# Patient Record
Sex: Male | Born: 1950 | Race: White | Hispanic: No | Marital: Married
Health system: Midwestern US, Community
[De-identification: ages and names within clinical notes are randomized; demographics above are authoritative.]

## PROBLEM LIST (undated history)

## (undated) DIAGNOSIS — N200 Calculus of kidney: Secondary | ICD-10-CM

## (undated) DIAGNOSIS — E119 Type 2 diabetes mellitus without complications: Secondary | ICD-10-CM

## (undated) DIAGNOSIS — G3184 Mild cognitive impairment, so stated: Secondary | ICD-10-CM

## (undated) DIAGNOSIS — R413 Other amnesia: Secondary | ICD-10-CM

## (undated) DIAGNOSIS — I10 Essential (primary) hypertension: Secondary | ICD-10-CM

## (undated) DIAGNOSIS — G2581 Restless legs syndrome: Secondary | ICD-10-CM

## (undated) DIAGNOSIS — R0981 Nasal congestion: Secondary | ICD-10-CM

## (undated) DIAGNOSIS — J310 Chronic rhinitis: Secondary | ICD-10-CM

## (undated) DIAGNOSIS — F32A Depression, unspecified: Secondary | ICD-10-CM

## (undated) DIAGNOSIS — E782 Mixed hyperlipidemia: Secondary | ICD-10-CM

## (undated) DIAGNOSIS — Z8619 Personal history of other infectious and parasitic diseases: Secondary | ICD-10-CM

## (undated) DIAGNOSIS — M549 Dorsalgia, unspecified: Secondary | ICD-10-CM

## (undated) DIAGNOSIS — Z9989 Dependence on other enabling machines and devices: Secondary | ICD-10-CM

## (undated) DIAGNOSIS — F329 Major depressive disorder, single episode, unspecified: Secondary | ICD-10-CM

## (undated) DIAGNOSIS — N400 Enlarged prostate without lower urinary tract symptoms: Secondary | ICD-10-CM

## (undated) DIAGNOSIS — G4700 Insomnia, unspecified: Secondary | ICD-10-CM

## (undated) DIAGNOSIS — E291 Testicular hypofunction: Secondary | ICD-10-CM

## (undated) DIAGNOSIS — Z8701 Personal history of pneumonia (recurrent): Secondary | ICD-10-CM

## (undated) DIAGNOSIS — J309 Allergic rhinitis, unspecified: Secondary | ICD-10-CM

## (undated) DIAGNOSIS — Z86010 Personal history of colonic polyps: Secondary | ICD-10-CM

## (undated) DIAGNOSIS — M170 Bilateral primary osteoarthritis of knee: Secondary | ICD-10-CM

## (undated) DIAGNOSIS — M5136 Other intervertebral disc degeneration, lumbar region: Secondary | ICD-10-CM

## (undated) DIAGNOSIS — G4733 Obstructive sleep apnea (adult) (pediatric): Secondary | ICD-10-CM

## (undated) DIAGNOSIS — N529 Male erectile dysfunction, unspecified: Secondary | ICD-10-CM

## (undated) DIAGNOSIS — R197 Diarrhea, unspecified: Principal | ICD-10-CM

## (undated) DIAGNOSIS — N401 Enlarged prostate with lower urinary tract symptoms: Secondary | ICD-10-CM

## (undated) DIAGNOSIS — J324 Chronic pansinusitis: Principal | ICD-10-CM

## (undated) DIAGNOSIS — E1165 Type 2 diabetes mellitus with hyperglycemia: Secondary | ICD-10-CM

## (undated) DIAGNOSIS — F419 Anxiety disorder, unspecified: Secondary | ICD-10-CM

## (undated) DIAGNOSIS — E7800 Pure hypercholesterolemia, unspecified: Secondary | ICD-10-CM

## (undated) DIAGNOSIS — Z1211 Encounter for screening for malignant neoplasm of colon: Secondary | ICD-10-CM

## (undated) DIAGNOSIS — D649 Anemia, unspecified: Secondary | ICD-10-CM

## (undated) DIAGNOSIS — K219 Gastro-esophageal reflux disease without esophagitis: Principal | ICD-10-CM

## (undated) DIAGNOSIS — R3912 Poor urinary stream: Principal | ICD-10-CM

## (undated) HISTORY — DX: Personal history of pneumonia (recurrent): Z87.01

## (undated) HISTORY — DX: Chronic rhinitis: J31.0

## (undated) HISTORY — DX: Other intervertebral disc degeneration, lumbar region: M51.36

## (undated) HISTORY — DX: Depression, unspecified: F32.A

## (undated) HISTORY — DX: Nasal congestion: R09.81

## (undated) HISTORY — DX: Dorsalgia, unspecified: M54.9

## (undated) HISTORY — DX: Restless legs syndrome: G25.81

## (undated) HISTORY — DX: Bilateral primary osteoarthritis of knee: M17.0

## (undated) HISTORY — DX: Mild cognitive impairment of uncertain or unknown etiology: G31.84

## (undated) HISTORY — DX: Personal history of colonic polyps: Z86.010

## (undated) HISTORY — DX: Type 2 diabetes mellitus without complications: E11.9

## (undated) HISTORY — DX: Allergic rhinitis, unspecified: J30.9

## (undated) HISTORY — PX: VITRECTOMY: SHX106

## (undated) HISTORY — DX: Obstructive sleep apnea (adult) (pediatric): G47.33

## (undated) HISTORY — DX: Insomnia, unspecified: G47.00

## (undated) HISTORY — DX: Calculus of kidney: N20.0

## (undated) HISTORY — DX: Testicular hypofunction: E29.1

## (undated) HISTORY — DX: Male erectile dysfunction, unspecified: N52.9

## (undated) HISTORY — DX: Personal history of other infectious and parasitic diseases: Z86.19

## (undated) HISTORY — PX: COLONOSCOPY: SHX174

## (undated) HISTORY — DX: Dependence on other enabling machines and devices: Z99.89

## (undated) HISTORY — PX: URETHRAL DILATION: SUR417

## (undated) HISTORY — DX: Major depressive disorder, single episode, unspecified: F32.9

## (undated) HISTORY — DX: Mixed hyperlipidemia: E78.2

## (undated) HISTORY — DX: Other amnesia: R41.3

## (undated) HISTORY — DX: Essential (primary) hypertension: I10

## (undated) HISTORY — PX: TONSILLECTOMY AND ADENOIDECTOMY: SHX28

## (undated) HISTORY — DX: Benign prostatic hyperplasia without lower urinary tract symptoms: N40.0

---
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
## 1991-12-01 DIAGNOSIS — E782 Mixed hyperlipidemia: Secondary | ICD-10-CM

## 1991-12-01 HISTORY — DX: Mixed hyperlipidemia: E78.2

## 1992-11-30 HISTORY — PX: VASECTOMY: SHX75

## 1998-11-30 HISTORY — PX: CORNEAL TRANSPLANT: SHX108

## 2006-11-30 DIAGNOSIS — Z8701 Personal history of pneumonia (recurrent): Secondary | ICD-10-CM

## 2006-11-30 HISTORY — DX: Personal history of pneumonia (recurrent): Z87.01

## 2007-12-01 DIAGNOSIS — I10 Essential (primary) hypertension: Secondary | ICD-10-CM

## 2007-12-01 HISTORY — DX: Essential (primary) hypertension: I10

## 2008-11-30 DIAGNOSIS — M51369 Other intervertebral disc degeneration, lumbar region without mention of lumbar back pain or lower extremity pain: Secondary | ICD-10-CM

## 2008-11-30 DIAGNOSIS — M5136 Other intervertebral disc degeneration, lumbar region: Secondary | ICD-10-CM

## 2008-11-30 HISTORY — DX: Other intervertebral disc degeneration, lumbar region without mention of lumbar back pain or lower extremity pain: M51.369

## 2008-11-30 HISTORY — DX: Other intervertebral disc degeneration, lumbar region: M51.36

## 2009-01-28 HISTORY — PX: LUMBAR LAMINECTOMY: SHX95

## 2009-01-29 HISTORY — PX: ELECTROCARDIOGRAM: SHX264

## 2011-07-08 ENCOUNTER — Encounter: Payer: Self-pay | Admitting: Family Medicine

## 2011-07-08 ENCOUNTER — Ambulatory Visit (INDEPENDENT_AMBULATORY_CARE_PROVIDER_SITE_OTHER): Payer: BC Managed Care – PPO | Admitting: Family Medicine

## 2011-07-08 DIAGNOSIS — I10 Essential (primary) hypertension: Secondary | ICD-10-CM

## 2011-07-08 DIAGNOSIS — E785 Hyperlipidemia, unspecified: Secondary | ICD-10-CM

## 2011-07-08 DIAGNOSIS — Z86010 Personal history of colonic polyps: Secondary | ICD-10-CM

## 2011-07-08 DIAGNOSIS — E669 Obesity, unspecified: Secondary | ICD-10-CM

## 2011-07-08 DIAGNOSIS — N400 Enlarged prostate without lower urinary tract symptoms: Secondary | ICD-10-CM

## 2011-07-08 DIAGNOSIS — R351 Nocturia: Secondary | ICD-10-CM

## 2011-07-08 DIAGNOSIS — Z947 Corneal transplant status: Secondary | ICD-10-CM

## 2011-07-08 DIAGNOSIS — E119 Type 2 diabetes mellitus without complications: Secondary | ICD-10-CM

## 2011-07-08 MED ORDER — FINASTERIDE 5 MG PO TABS: 5.0000 mg | ORAL_TABLET | Freq: Every day | ORAL | Status: DC

## 2011-07-08 NOTE — Assessment & Plan Note (Signed)
Control unknown due to no old records and patient not self-monitoring. His nocturia is likely symptom of his BPH but could also reflect hyperglycemia/glucosuria. Check HbA1c, CMET, obtain old records.  No changes in meds for this today. I encouraged him to start checking glucoses at least when he feels bad, gave him a glucometer and samples of strips (Freestyle lyte).

## 2011-07-08 NOTE — Assessment & Plan Note (Signed)
Refer to ophthalmologist 

## 2011-07-08 NOTE — Assessment & Plan Note (Signed)
Obtain old records. Check fasting lipid panel and CMET ASAP (not fasting today).

## 2011-07-08 NOTE — Progress Notes (Signed)
Office Note 07/08/2011  CC:  Chief Complaint  Patient presents with  . Establish Care    HPI:  [name] [name] is a 60 y.o. White male who is here to establish care. Patient's most recent primary MD: Dr. Mickey Farber, [place], [place]. Old records were not reviewed prior to or during today's visit.  Recently relocated from [name] in April this year.   No acute complaints except he does say he continues to have nocturia---q2h--but minimal daytime BPH sx's. Improved since being put on flomax at 0.8mg  qhs dosing but is interested in seeing if additional med would help b/c sleep is so disrupted by this frequent night-time urinating.  Has hx of EDS/fatigue and was dx'd with OSA and he could not tolerate CPAP in 2004.  When asked about possibly retrying now to see if updated masks/devices are better tolerated he said he would want to see if getting better sleep/peeing less at night helped first, then he'd reconsider trial of CPAP. Has DM x few years but doesn't monitor glucoses at home, says he didn't want to become obsessed with it.  Diabetic diet--not strict at all. He is sedentary other than brief daily walks with his dogs. Says prostate exams have been done but doesn't recall specific PSAs.   Needs GI referral for ongoing surveillance for hx of adenomatous colon polyps. 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 History of scarlet fever     age 76  . 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 Keratoconus     dx'd 1973  . Depression     Hospitalized for suicidal ideation 61  . Hyperlipidemia, mixed 1993  . Hypertension 2009  . Diabetes mellitus, type II   . OSA (obstructive sleep apnea) 2004    intolerant of CPAP  . History of pneumonia 2009    Hospitalized  . DDD  (degenerative disc disease), lumbar 2010    laminectomy 01/2009  . BPH (benign prostatic hypertrophy)   . Insomnia   . Rhinitis, chronic   . Erectile dysfunction     Past Surgical History  Procedure Date  . Appendectomy 1972  . Vasectomy 1994  . 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 Tonsillectomy and adenoidectomy     age 22  . Urethral dilation     2 as a child, one as an adult  . Colonoscopy X 3    Most recent was 2005, with removal of polyps in the first two.    Family History  Problem Relation Age of Onset  . Heart disease Mother   . Hyperlipidemia Mother   . Stroke Mother   . Diabetes Mother   . Depression Mother   . Alzheimer's disease Mother   . Stroke Father   . Hyperlipidemia Father   . Heart disease Father   . Diabetes Father   . Alcohol abuse Sister   . Depression Sister     History   Social History  . Marital Status: Married    Spouse Name: N/A    Number of Children: N/A  . Years of Education: N/A   Occupational History  . Not on file.   Social History Main Topics  . Smoking status: Never Smoker   . Smokeless  tobacco: Never Used  . Alcohol Use: Not on file  . Drug Use: Not on file  . Sexually Active: Not on file   Other Topics Concern  . Not on file   Social History Narrative   Divorced, then remarried.  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  Rare ETOH.  No drug abuse.Enjoys reading and spending time with his 2 dogs.    Outpatient Encounter Prescriptions as of 07/08/2011  Medication Sig Dispense Refill  . aspirin 325 MG tablet Take 325 mg by mouth daily.        . Choline Fenofibrate (TRILIPIX) 135 MG capsule Take 135 mg by mouth daily.        [name] Dextromethorphan-Guaifenesin (MUCINEX DM MAXIMUM STRENGTH) 60-1200 MG per 12 hr tablet Take 2 tablets by mouth at bedtime as needed.        . finasteride (PROSCAR) 5 MG tablet Take 1 tablet  (5 mg total) by mouth daily.  30 tablet  3  . glimepiride (AMARYL) 1 MG tablet Take 1 mg by mouth daily before breakfast.        . lisinopril (PRINIVIL,ZESTRIL) 10 MG tablet Take 10 mg by mouth daily.        . metFORMIN (GLUCOPHAGE) 500 MG tablet Take 500 mg by mouth 2 (two) times daily with a meal.        . mometasone (NASONEX) 50 MCG/ACT nasal spray Place 2 sprays into the nose daily.        . Naproxen Sodium (ALEVE) 220 MG CAPS Take 2 capsules by mouth at bedtime as needed.        . pravastatin (PRAVACHOL) 80 MG tablet Take 80 mg by mouth at bedtime.        . pseudoephedrine (SUDAFED) 30 MG tablet Take 60 mg by mouth at bedtime as needed.        . sildenafil (VIAGRA) 50 MG tablet Take 50 mg by mouth daily as needed.        . Tamsulosin HCl (FLOMAX) 0.4 MG CAPS Take 0.8 mg by mouth at bedtime.        . vitamin B-12 (CYANOCOBALAMIN) 1000 MCG tablet Take 1,000 mcg by mouth daily.        . zaleplon (SONATA) 10 MG capsule Take 10 mg by mouth at bedtime as needed.          Allergies  Allergen Reactions  . Codeine Nausea Only  . Penicillins Hives  . Sulfa Antibiotics Hives    ROS Review of Systems  Constitutional: Negative for fever, chills, appetite change and fatigue.  HENT: Negative for ear pain, congestion, sore throat, neck stiffness and dental problem.   Eyes: Negative for discharge, redness and visual disturbance.  Respiratory: Negative for cough, chest tightness, shortness of breath and wheezing.   Cardiovascular: Negative for chest pain, palpitations and leg swelling.  Gastrointestinal: Negative for nausea, vomiting, abdominal pain, diarrhea and blood in stool.  Genitourinary: Negative for dysuria, urgency, frequency, hematuria, flank pain and difficulty urinating.  Musculoskeletal: Negative for myalgias, back pain, joint swelling and arthralgias.  Skin: Negative for pallor and rash.  Neurological: Negative for dizziness, speech difficulty, weakness and headaches.    Hematological: Negative for adenopathy. Does not bruise/bleed easily.  Psychiatric/Behavioral: Negative for confusion, sleep disturbance and dysphoric mood. The patient is not nervous/anxious.     PE; Blood pressure 130/82, pulse 92, height 5\' 10"  (1.778 m),  weight 273 lb (123.832 kg), SpO2 93.00%. Gen: Alert, well appearing, obese white male.  Patient is oriented to person, place, time, and situation.  HEENT:   Eyes: no injection, icteris, swelling, or exudate.  EOMI, PERRLA. Nose: no drainage or turbinate edema/swelling. Mouth: lips without lesion/swelling.  Oral mucosa pink and moist.  Dentition intact and without obvious caries or gingival swelling.  Oropharynx without erythema, exudate, or swelling.  Neck: supple, ROM full.  Carotids 2+ bilat, without bruit.  No lymphadenopathy, thyromegaly, or mass. Chest: symmetric expansion, nonlabored respirations.  Clear and equal breath sounds in all lung fields.   CV: RRR, no m/r/g.  Peripheral pulses 2+ and symmetric. ABD: soft, NT, ND, BS normal.  No hepatospenomegaly or mass.  No bruits. EXT: no clubbing, cyanosis, or edema.   Pertinent labs:  none  ASSESSMENT AND PLAN:  New patient:   Type II or unspecified type diabetes mellitus without mention of complication, not stated as uncontrolled Control unknown due to no old records and patient not self-monitoring. His nocturia is likely symptom of his BPH but could also reflect hyperglycemia/glucosuria. Check HbA1c, CMET, obtain old records.  No changes in meds for this today. I encouraged him to start checking glucoses at least when he feels bad, gave him a glucometer and samples of strips (Freestyle lyte).  HTN (hypertension), benign Problem stable.  Continue current medications and diet appropriate for this condition.  We have reviewed our general long term plan for this problem and also reviewed symptoms and signs that should prompt the patient to call or return to the office. Checking  CMET.  Hyperlipidemia Obtain old records. Check fasting lipid panel and CMET ASAP (not fasting today).  Hx of adenomatous colonic polyps Refer to GI.  Status post corneal transplant Refer to ophthalmologist.  BPH (benign prostatic hypertrophy) Add proscar 5mg  qd.  Continue flomax at 0.8mg  qhs dosing. Check PSA. Obtain old records.     Return for appt for lab visit ASAP for fasting lab work and make o/v at your convenience for CPE.

## 2011-07-08 NOTE — Assessment & Plan Note (Signed)
Problem stable.  Continue current medications and diet appropriate for this condition.  We have reviewed our general long term plan for this problem and also reviewed symptoms and signs that should prompt the patient to call or return to the office. Checking CMET.

## 2011-07-08 NOTE — Assessment & Plan Note (Signed)
Refer to GI 

## 2011-07-08 NOTE — Assessment & Plan Note (Signed)
Add proscar 5mg  qd.  Continue flomax at 0.8mg  qhs dosing. Check PSA. Obtain old records.

## 2011-07-09 ENCOUNTER — Encounter: Payer: Self-pay | Admitting: Gastroenterology

## 2011-07-09 ENCOUNTER — Other Ambulatory Visit (INDEPENDENT_AMBULATORY_CARE_PROVIDER_SITE_OTHER): Payer: BC Managed Care – PPO

## 2011-07-09 ENCOUNTER — Other Ambulatory Visit: Payer: Self-pay | Admitting: Family Medicine

## 2011-07-09 DIAGNOSIS — E785 Hyperlipidemia, unspecified: Secondary | ICD-10-CM

## 2011-07-09 DIAGNOSIS — E669 Obesity, unspecified: Secondary | ICD-10-CM

## 2011-07-09 DIAGNOSIS — R351 Nocturia: Secondary | ICD-10-CM

## 2011-07-09 DIAGNOSIS — E119 Type 2 diabetes mellitus without complications: Secondary | ICD-10-CM

## 2011-07-09 DIAGNOSIS — N400 Enlarged prostate without lower urinary tract symptoms: Secondary | ICD-10-CM

## 2011-07-09 DIAGNOSIS — I10 Essential (primary) hypertension: Secondary | ICD-10-CM

## 2011-07-09 LAB — LIPID PANEL
Cholesterol: 215 mg/dL — ABNORMAL HIGH (ref 0–200)
HDL: 43.5 mg/dL (ref >39.00)
Total CHOL/HDL Ratio: 5
Triglycerides: 245 mg/dL — ABNORMAL HIGH (ref 0.0–149.0)
VLDL: 49 mg/dL — ABNORMAL HIGH (ref 0.0–40.0)

## 2011-07-09 LAB — CBC WITH DIFFERENTIAL/PLATELET
Basophils Absolute: 0 10*3/uL (ref 0.0–0.1)
Basophils Relative: 0.4 % (ref 0.0–3.0)
Eosinophils Absolute: 0.1 10*3/uL (ref 0.0–0.7)
Eosinophils Relative: 1.7 % (ref 0.0–5.0)
HCT: 39.8 % (ref 39.0–52.0)
Hemoglobin: 13.4 g/dL (ref 13.0–17.0)
Lymphocytes Relative: 41.1 % (ref 12.0–46.0)
Lymphs Abs: 2.7 10*3/uL (ref 0.7–4.0)
MCHC: 33.7 g/dL (ref 30.0–36.0)
MCV: 89.6 fl (ref 78.0–100.0)
Monocytes Absolute: 0.5 10*3/uL (ref 0.1–1.0)
Monocytes Relative: 7.2 % (ref 3.0–12.0)
Neutro Abs: 3.2 10*3/uL (ref 1.4–7.7)
Neutrophils Relative %: 49.6 % (ref 43.0–77.0)
Platelets: 223 10*3/uL (ref 150.0–400.0)
RBC: 4.45 Mil/uL (ref 4.22–5.81)
RDW: 12.8 % (ref 11.5–14.6)
WBC: 6.5 10*3/uL (ref 4.5–10.5)

## 2011-07-09 LAB — COMPREHENSIVE METABOLIC PANEL
ALT: 22 U/L (ref 0–53)
AST: 16 U/L (ref 0–37)
Albumin: 4.1 g/dL (ref 3.5–5.2)
Alkaline Phosphatase: 42 U/L (ref 39–117)
BUN: 19 mg/dL (ref 6–23)
CO2: 26 mEq/L (ref 19–32)
Calcium: 9.2 mg/dL (ref 8.4–10.5)
Chloride: 99 mEq/L (ref 96–112)
Creatinine, Ser: 0.8 mg/dL (ref 0.4–1.5)
GFR: 103.82 mL/min (ref >60.00)
Glucose, Bld: 172 mg/dL — ABNORMAL HIGH (ref 70–99)
Potassium: 4.1 mEq/L (ref 3.5–5.1)
Sodium: 136 mEq/L (ref 135–145)
Total Bilirubin: 0.9 mg/dL (ref 0.3–1.2)
Total Protein: 6.7 g/dL (ref 6.0–8.3)

## 2011-07-09 LAB — PSA: PSA: 0.12 ng/mL (ref 0.10–4.00)

## 2011-07-09 LAB — TSH: TSH: 1.44 u[IU]/mL (ref 0.35–5.50)

## 2011-07-09 LAB — LDL CHOLESTEROL, DIRECT: Direct LDL: 131.4 mg/dL

## 2011-07-14 LAB — HEMOGLOBIN A1C: Hgb A1c MFr Bld: 7.5 % — ABNORMAL HIGH (ref 4.6–6.5)

## 2011-07-15 ENCOUNTER — Ambulatory Visit (INDEPENDENT_AMBULATORY_CARE_PROVIDER_SITE_OTHER): Payer: BC Managed Care – PPO | Admitting: Family Medicine

## 2011-07-15 ENCOUNTER — Encounter: Payer: Self-pay | Admitting: Family Medicine

## 2011-07-15 VITALS — BP 131/86 | HR 86 | Temp 98.1°F | Ht 70.0 in | Wt 273.0 lb

## 2011-07-15 DIAGNOSIS — Z0000 Encounter for general adult medical examination without abnormal findings: Secondary | ICD-10-CM

## 2011-07-15 DIAGNOSIS — E119 Type 2 diabetes mellitus without complications: Secondary | ICD-10-CM

## 2011-07-15 DIAGNOSIS — E785 Hyperlipidemia, unspecified: Secondary | ICD-10-CM

## 2011-07-15 MED ORDER — COLESEVELAM HCL 3.75 G PO PACK: 3.7500 g | PACK | Freq: Every day | ORAL | Status: DC

## 2011-07-15 NOTE — Patient Instructions (Signed)
Health Maintenance in Males MAINTAIN REGULAR HEALTH EXAMS  Maintain a healthy diet and normal weight. Increased weight leads to problems with blood pressure and diabetes. Decrease fat in the diet and increase exercise. Obtain a proper diet from your caregiver if necessary.   High blood pressure causes heart and blood vessel problems. Check blood pressures regularly and keep your blood pressure at normal limits. Aerobic exercise helps this. Persistent elevations of blood pressure should be treated with medications if weight loss and exercise are ineffective.   Avoid smoking, drinking in excess (more than 2 drinks per day), or use of street drugs. Do not share needles with anyone. Ask for help if you need assistance or instructions on stopping the use of alcohol, cigarettes, or drugs.   Maintain normal blood lipids and cholesterol. Your caregiver can give you information to lower your risk of heart disease or stroke.   Ask your caregiver if you are in need of early heart disease screening because of a strong family history of heart disease or signs of elevated testosterone (male sex hormone) levels. These can predispose you to early heart disease.   Practice safe sex. Practicing safe sex decreases your risk for a sexually transmitted infection (STI). Some of the STIs are gonorrhea, chlamydia, syphilis, trichimonas, herpes, human papillomavirus (HPV), and human immunodeficiency virus (HIV). Herpes, HIV, and HPV are viral illnesses that have no cure. These can result in disability, cancer, and death.   It is not safe for someone who has AIDS or is HIV positive to have unprotected sex with a partner who is HIV positive. The reason for this is the fact that there are many different strains of HIV. If you have a strain that is readily treated with medications and then suddenly introduce a strain from a partner that has no further treatment options, you may suddenly have a strain of HIV that is untreatable.  Even if you are both positive for HIV, it is still necessary to practice safe sex.   Use sunscreen with a SPF of 15 or greater. Being outside in the sun when your shadow caused by the sun is shorter than you are, means you are being exposed to sun at greater intensity. Lighter skinned people are at a greater risk of skin cancer.   Keep carbon monoxide and smoke detectors in your home and functioning at all times. Change the batteries every 6 months.   Do monthly examinations of your testicles. The best time to do this is after a hot shower or bath when the tissues are loose. Notify your caregivers of any lumps, tenderness, or changes in size or shape.   Notify your caregiver of new moles or changes in moles, especially if there is a change in shape or color. Also notify your caregiver if a mole is larger than the size of a pencil eraser.   Stay current with your tetanus shots and other required immunizations.  The Body Mass Index (BMI) is a way of measuring how much of your body is fat. Having a BMI above 27 increases the risk of heart disease, diabetes, hypertension, stroke, and other problems related to obesity. Document Released: 05/14/2008 Document Re-Released: 05/06/2010 [employer] Patient Information 2011 [employer]. 

## 2011-07-16 DIAGNOSIS — Z0000 Encounter for general adult medical examination without abnormal findings: Secondary | ICD-10-CM | POA: Insufficient documentation

## 2011-07-16 NOTE — Assessment & Plan Note (Signed)
Intolerant of any statin other than the one he is on now, so we'll leave him on this (he is on max dose) and will add welchol 3.75mg  pack in 4 oz juice once daily.  Low fat/low chol diet.

## 2011-07-16 NOTE — Progress Notes (Signed)
Office Note 07/16/2011  CC:  Chief Complaint  Patient presents with  . Annual Exam    HPI:  [name] [name] is a 60 y.o. White male who is here for annual CPE/health maintenance exam.  Feeling well, no acute complaints. Has started proscar x 5d or so but feels no difference in nocturia yet. No side effects noted. We reviewed his labs from last week in detail.  Of note, LDL and trigs not at goal, HbA1c 7.5%. All others, including PSA and TSH normal. He has had myalgias on multiple statins in the past (he cannot recall names), and wishes to avoid a switch to another statin.   Past Medical History  Diagnosis Date  . History of scarlet fever     age 70  . 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 Keratoconus     dx'd 1973  . Depression     Hospitalized for suicidal ideation 19  . Hyperlipidemia, mixed 1993  . Hypertension 2009  . Diabetes mellitus, type II   . OSA (obstructive sleep apnea) 2004    intolerant of CPAP  . History of pneumonia 2009    Hospitalized  . DDD (degenerative disc disease), lumbar 2010    laminectomy 01/2009  . BPH (benign prostatic hypertrophy)   . Insomnia   . Rhinitis, chronic   . Erectile dysfunction     Past Surgical History  Procedure Date  . Appendectomy 1972  . Vasectomy 1994  . 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 Tonsillectomy and adenoidectomy     age 66  . Urethral dilation     2 as a child, one as an adult  . Colonoscopy X 3    Most recent was 2005, with removal of polyps in the first two.    Family History  Problem Relation Age of Onset  . Heart disease Mother   . Hyperlipidemia Mother   . Stroke Mother   . Diabetes Mother   . Depression Mother   . Alzheimer's disease Mother   . Stroke Father   . Hyperlipidemia Father   . Heart disease Father   . Diabetes Father   . Alcohol abuse Sister   . Depression Sister     History   Social History  . Marital Status:  Married    Spouse Name: N/A    Number of Children: N/A  . Years of Education: N/A   Occupational History  . Not on file.   Social History Main Topics  . Smoking status: Never Smoker   . Smokeless tobacco: Never Used  . Alcohol Use: Not on file  . Drug Use: Not on file  . Sexually Active: Not on file   Other Topics Concern  . Not on file   Social History Narrative   Divorced, then remarried.  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  Rare ETOH.  No drug abuse.Enjoys reading and spending time with his 2 dogs.    Outpatient Prescriptions Prior to Visit  Medication Sig Dispense Refill  . aspirin 325 MG tablet Take 325 mg by mouth daily.        . Choline Fenofibrate (TRILIPIX) 135 MG capsule Take 135 mg by mouth daily.        . finasteride (PROSCAR) 5 MG tablet Take 1 tablet (5 mg total)  by mouth daily.  30 tablet  3  . lisinopril (PRINIVIL,ZESTRIL) 10 MG tablet Take 10 mg by mouth daily.        . metFORMIN (GLUCOPHAGE) 500 MG tablet Take 500 mg by mouth 2 (two) times daily with a meal.        . mometasone (NASONEX) 50 MCG/ACT nasal spray Place 2 sprays into the nose daily.        . Naproxen Sodium (ALEVE) 220 MG CAPS Take 2 capsules by mouth at bedtime as needed.        . pravastatin (PRAVACHOL) 80 MG tablet Take 80 mg by mouth at bedtime.        . pseudoephedrine (SUDAFED) 30 MG tablet Take 60 mg by mouth at bedtime as needed.        . sildenafil (VIAGRA) 50 MG tablet Take 50 mg by mouth daily as needed.        . Tamsulosin HCl (FLOMAX) 0.4 MG CAPS Take 0.8 mg by mouth at bedtime.        . vitamin B-12 (CYANOCOBALAMIN) 1000 MCG tablet Take 1,000 mcg by mouth daily.        . zaleplon (SONATA) 10 MG capsule Take 10 mg by mouth at bedtime as needed.        [name] Dextromethorphan-Guaifenesin (MUCINEX DM MAXIMUM STRENGTH) 60-1200 MG per 12 hr tablet Take 2 tablets by mouth at bedtime as  needed.        [name] glimepiride (AMARYL) 1 MG tablet Take 1 mg by mouth daily before breakfast.          Allergies  Allergen Reactions  . Antihistamines, Diphenhydramine-Type Other (See Comments)    depression  . Codeine Nausea Only  . Penicillins Hives  . Sulfa Antibiotics Hives    ROS Review of Systems  Constitutional: Negative for fever, chills, appetite change and fatigue.  HENT: Negative for ear pain, congestion, sore throat, neck stiffness and dental problem.   Eyes: Negative for discharge, redness and visual disturbance.  Respiratory: Negative for cough, chest tightness, shortness of breath and wheezing.   Cardiovascular: Negative for chest pain, palpitations and leg swelling.  Gastrointestinal: Negative for nausea, vomiting, abdominal pain, diarrhea and blood in stool.  Genitourinary: Negative for dysuria, urgency, frequency, hematuria, flank pain and difficulty urinating.  Musculoskeletal: Negative for myalgias, back pain, joint swelling and arthralgias.  Skin: Negative for pallor and rash.  Neurological: Negative for dizziness, speech difficulty, weakness and headaches.  Hematological: Negative for adenopathy. Does not bruise/bleed easily.  Psychiatric/Behavioral: Negative for confusion and sleep disturbance. The patient is not nervous/anxious.     PE; Blood pressure 131/86, pulse 86, temperature 98.1 F (36.7 C), temperature source Oral, height 5\' 10"  (1.778 m), weight 273 lb (123.832 kg), SpO2 92.00%. Gen: Alert, well appearing, obese-appearing, white male.  Patient is oriented to person, place, time, and situation. HEENT: Scalp without lesions or hair loss.  Ears: EACs clear, normal epithelium.  TMs with good light reflex and landmarks bilaterally.  Eyes: no injection, icteris, swelling, or exudate.  EOMI, PERRLA. Nose: no drainage or turbinate edema/swelling.  No injection or focal lesion.  Mouth: lips without lesion/swelling.  Oral mucosa pink and moist.  Dentition  intact and without obvious caries or gingival swelling.  Oropharynx without erythema, exudate, or swelling.  Neck: supple, ROM full.  Carotids 2+ bilat, without bruit.  No lymphadenopathy, thyromegaly, or mass. Chest: symmetric expansion, nonlabored respirations.  Clear and equal breath sounds in all lung fields.   CV:  RRR, no m/r/g.  Peripheral pulses 2+ and symmetric. ABD: soft, NT, ND, BS normal.  No hepatospenomegaly or mass.  No bruits. EXT: no clubbing, cyanosis, or edema.  Skin - no sores or suspicious lesions or rashes or color changes Neuro: CN 2-12 intact bilaterally, strength 5/5 in proximal and distal upper extremities and lower extremities bilaterally.  No sensory deficits.  No tremor.  No disdiadochokinesis.  No ataxia.  Upper extremity and lower extremity DTRs symmetric.  No pronator drift. Genitals normal; both testes normal without tenderness, masses, hydroceles, varicoceles, erythema or swelling. Shaft normal, circumcised, meatus normal without discharge. No inguinal hernia noted. No inguinal lymphadenopathy.   Pertinent labs:  none  ASSESSMENT AND PLAN:   Health maintenance examination General exercise recommendations, health screening approp for age, and vaccinations approp for age discussed today. Encouraged more stringent diabetic diet, also low sodium/heart healthy. Appt for GI to f/u hx of colon polyps is in process. Prostate cancer screening is neg (PSA + DRE normal w/in the last week)--repeat in 1 yr.  Type II or unspecified type diabetes mellitus without mention of complication, not stated as uncontrolled Lab Results  Component Value Date   HGBA1C 7.5* 07/09/2011   Continue current meds (he insists he is on the max dose of metformin--two of the 500mg  tabs twice daily) and get more aggressive with diabetic diet and exercise.  Also, he has yet to add the amaryl 1mg  qd that he has been rx.  Discussed starting this and monitoring glucoses qd (alt fasting with a 2H  pp), titrate amaryl to 2mg  or bid dosing as necessary.    Hyperlipidemia Intolerant of any statin other than the one he is on now, so we'll leave him on this (he is on max dose) and will add welchol 3.75mg  pack in 4 oz juice once daily.  Low fat/low chol diet.     FOLLOW UP:  Return in about 3 months (around 10/15/2011) for f/u DM 2, hyperchol, and BPH.

## 2011-07-16 NOTE — Assessment & Plan Note (Addendum)
General exercise recommendations, health screening approp for age, and vaccinations approp for age discussed today. Encouraged more stringent diabetic diet, also low sodium/heart healthy. Appt for GI to f/u hx of colon polyps is in process. Prostate cancer screening is neg (PSA + DRE normal w/in the last week)--repeat in 1 yr.

## 2011-07-16 NOTE — Assessment & Plan Note (Signed)
Lab Results  Component Value Date   HGBA1C 7.5* 07/09/2011   Continue current meds (he insists he is on the max dose of metformin--two of the 500mg  tabs twice daily) and get more aggressive with diabetic diet and exercise.  Also, he has yet to add the amaryl 1mg  qd that he has been rx.  Discussed starting this and monitoring glucoses qd (alt fasting with a 2H pp), titrate amaryl to 2mg  or bid dosing as necessary.

## 2011-07-24 ENCOUNTER — Encounter: Payer: Self-pay | Admitting: Family Medicine

## 2011-07-27 ENCOUNTER — Encounter: Payer: Self-pay | Admitting: Family Medicine

## 2011-08-10 ENCOUNTER — Other Ambulatory Visit: Payer: BC Managed Care – PPO | Admitting: Gastroenterology

## 2011-08-24 ENCOUNTER — Telehealth: Payer: Self-pay | Admitting: Family Medicine

## 2011-08-24 MED ORDER — CHOLINE FENOFIBRATE 135 MG PO CPDR: 135.0000 mg | DELAYED_RELEASE_CAPSULE | Freq: Every day | ORAL | Status: DC

## 2011-08-24 MED ORDER — PRAVASTATIN SODIUM 80 MG PO TABS: 80.0000 mg | ORAL_TABLET | Freq: Every day | ORAL | Status: DC

## 2011-08-24 MED ORDER — ESCITALOPRAM OXALATE 10 MG PO TABS: 10.0000 mg | ORAL_TABLET | Freq: Every day | ORAL | Status: DC

## 2011-08-24 NOTE — Telephone Encounter (Signed)
Patient is requesting refills for Pravastatin, trilipix, and escitalopram to be sent to [facility], [address] [place]

## 2011-08-24 NOTE — Telephone Encounter (Signed)
Last seen 07/15/11, follow up on 10/15/11.  RX sent until that time.

## 2011-09-16 ENCOUNTER — Other Ambulatory Visit: Payer: BC Managed Care – PPO | Admitting: Gastroenterology

## 2011-10-08 ENCOUNTER — Other Ambulatory Visit (INDEPENDENT_AMBULATORY_CARE_PROVIDER_SITE_OTHER): Payer: BC Managed Care – PPO

## 2011-10-08 DIAGNOSIS — E785 Hyperlipidemia, unspecified: Secondary | ICD-10-CM

## 2011-10-08 DIAGNOSIS — E119 Type 2 diabetes mellitus without complications: Secondary | ICD-10-CM

## 2011-10-08 LAB — HEMOGLOBIN A1C: Hgb A1c MFr Bld: 6.8 % — ABNORMAL HIGH (ref 4.6–6.5)

## 2011-10-08 LAB — LIPID PANEL
Cholesterol: 210 mg/dL — ABNORMAL HIGH (ref 0–200)
HDL: 43 mg/dL (ref >39.00)
Total CHOL/HDL Ratio: 5
Triglycerides: 255 mg/dL — ABNORMAL HIGH (ref 0.0–149.0)
VLDL: 51 mg/dL — ABNORMAL HIGH (ref 0.0–40.0)

## 2011-10-08 LAB — LDL CHOLESTEROL, DIRECT: Direct LDL: 141.4 mg/dL

## 2011-10-15 ENCOUNTER — Telehealth: Payer: Self-pay | Admitting: Family Medicine

## 2011-10-15 ENCOUNTER — Encounter: Payer: Self-pay | Admitting: Family Medicine

## 2011-10-15 ENCOUNTER — Ambulatory Visit (INDEPENDENT_AMBULATORY_CARE_PROVIDER_SITE_OTHER): Payer: BC Managed Care – PPO | Admitting: Family Medicine

## 2011-10-15 DIAGNOSIS — I10 Essential (primary) hypertension: Secondary | ICD-10-CM

## 2011-10-15 DIAGNOSIS — E119 Type 2 diabetes mellitus without complications: Secondary | ICD-10-CM

## 2011-10-15 DIAGNOSIS — N529 Male erectile dysfunction, unspecified: Secondary | ICD-10-CM

## 2011-10-15 DIAGNOSIS — Z86010 Personal history of colonic polyps: Secondary | ICD-10-CM

## 2011-10-15 DIAGNOSIS — Z947 Corneal transplant status: Secondary | ICD-10-CM

## 2011-10-15 DIAGNOSIS — Z23 Encounter for immunization: Secondary | ICD-10-CM

## 2011-10-15 DIAGNOSIS — N400 Enlarged prostate without lower urinary tract symptoms: Secondary | ICD-10-CM

## 2011-10-15 DIAGNOSIS — E782 Mixed hyperlipidemia: Secondary | ICD-10-CM

## 2011-10-15 DIAGNOSIS — R5381 Other malaise: Secondary | ICD-10-CM

## 2011-10-15 DIAGNOSIS — R5383 Other fatigue: Secondary | ICD-10-CM

## 2011-10-15 NOTE — Assessment & Plan Note (Signed)
No big changes since adding proscar about 48mo ago.  He'll give it a bit longer and may just self d/c if he doesn't see much change when it comes time for RF authorization.

## 2011-10-15 NOTE — Progress Notes (Signed)
OFFICE NOTE  10/15/2011  CC:  Chief Complaint  Patient presents with  . Hyperlipidemia  . Diabetes  . Benign Prostatic Hypertrophy     HPI:   Patient is a 60 y.o. Caucasian male who is here for DM 2, hyperlipidemia, and BPH. Added welchol a few months ago and most lipid numbers worsened just a tiny bit, HDL stayed the same. He admits to noncompliance with welchol for the last month or so. His A1c was improved from 7.5% to 6.8% on labs last week.  He does not check his glucoses any. Denies feet sx's. Says addition of proscar a few months ago has not changed his urinary frequency/nocturia any significant amount (goes max of 2.5-3 hrs between urinating).  Pertinent PMH:  Past Medical History  Diagnosis Date  . History of scarlet fever     age 18  . 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 Keratoconus     dx'd 1973  . Depression     Hospitalized for suicidal ideation 37.  Has been on lexapro since 10/2008.  [name] Hyperlipidemia, mixed 1993  . Hypertension 2009    "marked chronic cardiomegaly" on CXR 01/2009 per old records.  . Diabetes mellitus type II     Dx'd approximately 06/2008  . OSA (obstructive sleep apnea) 2004    intolerant of CPAP  . History of pneumonia 2009    Hospitalized  . DDD (degenerative disc disease), lumbar 2010    laminectomy 01/2009  . BPH (benign prostatic hypertrophy)   . Insomnia   . Rhinitis, chronic   . Erectile dysfunction    PSH, FH, Soc Hx reviewed and no changes since last f/u visit.  MEDS;   Outpatient Prescriptions Prior to Visit  Medication Sig Dispense Refill  . aspirin 325 MG tablet Take 325 mg by mouth daily.        . Choline Fenofibrate (TRILIPIX) 135 MG capsule Take 1 capsule (135 mg total) by mouth daily.  30 capsule  1  . Colesevelam HCl (WELCHOL) 3.75 G PACK Take 3.75 g by mouth daily.  90 each  6  . Dextromethorphan-Guaifenesin (MUCINEX DM MAXIMUM STRENGTH) 60-1200 MG per 12 hr  tablet Take 2 tablets by mouth at bedtime as needed.        [name] escitalopram (LEXAPRO) 10 MG tablet Take 1 tablet (10 mg total) by mouth daily.  30 tablet  1  . finasteride (PROSCAR) 5 MG tablet Take 1 tablet (5 mg total) by mouth daily.  30 tablet  3  . glimepiride (AMARYL) 1 MG tablet Take 1 mg by mouth daily before breakfast.        . lisinopril (PRINIVIL,ZESTRIL) 10 MG tablet Take 10 mg by mouth daily.        . metFORMIN (GLUCOPHAGE) 500 MG tablet Take 500 mg by mouth 2 (two) times daily with a meal.        . mometasone (NASONEX) 50 MCG/ACT nasal spray Place 2 sprays into the nose daily.        . Naproxen Sodium (ALEVE) 220 MG CAPS Take 2 capsules by mouth at bedtime as needed.        . pravastatin (PRAVACHOL) 80 MG tablet Take 1 tablet (80 mg total) by mouth at bedtime.  30 tablet  1  . pseudoephedrine (SUDAFED) 30 MG tablet Take 60 mg by mouth at bedtime as needed.        [name]  sildenafil (VIAGRA) 50 MG tablet Take 50 mg by mouth daily as needed.        . Tamsulosin HCl (FLOMAX) 0.4 MG CAPS Take 0.8 mg by mouth at bedtime.        . vitamin B-12 (CYANOCOBALAMIN) 1000 MCG tablet Take 1,000 mcg by mouth daily.        . zaleplon (SONATA) 10 MG capsule Take 10 mg by mouth at bedtime as needed.          PE: Blood pressure 134/84, pulse 99, height 5\' 10"  (1.778 m), weight 273 lb (123.832 kg). His wt is stable. Gen: Alert, well appearing.  Patient is oriented to person, place, time, and situation. No further exam today.  IMPRESSION AND PLAN:  Type II or unspecified type diabetes mellitus without mention of complication, not stated as uncontrolled A1c improved, at goal.  He's noncompliant with monitoring but does well with diet and med compliance. Eye exam UTD now: no diab retin. Urine microalb/cr ratio UTD (due again after 12/2011). Foot exam to be done at next f/u in 3 mo.   HTN (hypertension), benign Problem stable.  Continue current medications and diet appropriate for this condition.  We  have reviewed our general long term plan for this problem and also reviewed symptoms and signs that should prompt the patient to call or return to the office.   BPH (benign prostatic hypertrophy) No big changes since adding proscar about 7mo ago.  He'll give it a bit longer and may just self d/c if he doesn't see much change when it comes time for RF authorization.  Hx of adenomatous colonic polyps He's going to try to schedule this for early 2013 since he's had to put this off a few times for scheduling and financial reasons.  Status post corneal transplant His local eye MD referred him on to a different eye specialist to follow this.  Hyperlipidemia Due to relatively poor compliance with welchol we'll make no changes at this time.  He'll get more consistent with taking the welchol and he'll continue his trilipix and pravastatin.  We brought up the possibility of retrying niaspan in the near future.  He is doing well with low chol/low fat diet.   Fatigue, ED, wt issues, sleep issues: pt wonders about checking for low testosterone.  Will add this test to his labs at next f/u in 7mo.  Tdap and flu vaccine IM today.  FOLLOW UP:  Return in about 3 months (around 01/15/2012) for f/u DM 2, hyperlipidemia.

## 2011-10-15 NOTE — Assessment & Plan Note (Signed)
He's going to try to schedule this for early 2013 since he's had to put this off a few times for scheduling and financial reasons.

## 2011-10-15 NOTE — Assessment & Plan Note (Signed)
Due to relatively poor compliance with welchol we'll make no changes at this time.  He'll get more consistent with taking the welchol and he'll continue his trilipix and pravastatin.  We brought up the possibility of retrying niaspan in the near future.  He is doing well with low chol/low fat diet.

## 2011-10-15 NOTE — Assessment & Plan Note (Addendum)
A1c improved, at goal.  He's noncompliant with monitoring but does well with diet and med compliance. Eye exam UTD now: no diab retin. Urine microalb/cr ratio UTD (due again after 12/2011). Foot exam to be done at next f/u in 3 mo.

## 2011-10-15 NOTE — Assessment & Plan Note (Signed)
His local eye MD referred him on to a different eye specialist to follow this.

## 2011-10-15 NOTE — Progress Notes (Signed)
Addended by: Andrew Au on: 10/15/2011 11:22 AM   Modules accepted: Orders

## 2011-10-15 NOTE — Telephone Encounter (Signed)
Sorry, but I was supposed to put "lab visit for fasting labs the week prior to next f/u in 78mo" on his AVS. Could you call him at some point when you are not busy and schedule this lab visit?  Thx.

## 2011-10-15 NOTE — Assessment & Plan Note (Signed)
Problem stable.  Continue current medications and diet appropriate for this condition.  We have reviewed our general long term plan for this problem and also reviewed symptoms and signs that should prompt the patient to call or return to the office.  

## 2011-10-16 NOTE — Telephone Encounter (Signed)
Looks like Lowella Bandy already did it, she scheduled a lab visit for 01/08/12

## 2011-10-16 NOTE — Telephone Encounter (Signed)
Great.  Thx for checking!

## 2011-11-02 ENCOUNTER — Other Ambulatory Visit: Payer: Self-pay | Admitting: Family Medicine

## 2011-11-02 NOTE — Telephone Encounter (Signed)
Last seen 10/15/11, follow up on 01/15/12.  RX sent until that time.

## 2011-12-24 ENCOUNTER — Ambulatory Visit (INDEPENDENT_AMBULATORY_CARE_PROVIDER_SITE_OTHER): Payer: BC Managed Care – PPO | Admitting: Family Medicine

## 2011-12-24 ENCOUNTER — Encounter: Payer: Self-pay | Admitting: Family Medicine

## 2011-12-24 VITALS — BP 143/90 | HR 106 | Temp 98.0°F | Ht 70.0 in | Wt 274.0 lb

## 2011-12-24 DIAGNOSIS — J019 Acute sinusitis, unspecified: Secondary | ICD-10-CM

## 2011-12-24 MED ORDER — ESCITALOPRAM OXALATE 10 MG PO TABS: 10.0000 mg | ORAL_TABLET | Freq: Every day | ORAL | Status: DC

## 2011-12-24 MED ORDER — CHOLINE FENOFIBRATE 135 MG PO CPDR: 135.0000 mg | DELAYED_RELEASE_CAPSULE | Freq: Every day | ORAL | Status: DC

## 2011-12-24 MED ORDER — LISINOPRIL 10 MG PO TABS: 10.0000 mg | ORAL_TABLET | Freq: Every day | ORAL | Status: DC

## 2011-12-24 MED ORDER — HYDROCODONE-ACETAMINOPHEN 5-500 MG PO TABS: ORAL_TABLET | ORAL | Status: DC

## 2011-12-24 MED ORDER — ZALEPLON 10 MG PO CAPS: 10.0000 mg | ORAL_CAPSULE | Freq: Every evening | ORAL | Status: DC | PRN

## 2011-12-24 MED ORDER — METFORMIN HCL 500 MG PO TABS: 500.0000 mg | ORAL_TABLET | Freq: Two times a day (BID) | ORAL | Status: DC

## 2011-12-24 MED ORDER — AZITHROMYCIN 250 MG PO TABS: ORAL_TABLET | ORAL | Status: DC

## 2011-12-24 MED ORDER — FINASTERIDE 5 MG PO TABS: 5.0000 mg | ORAL_TABLET | Freq: Every day | ORAL | Status: DC

## 2011-12-24 MED ORDER — MOMETASONE FUROATE 50 MCG/ACT NA SUSP: NASAL | Status: DC

## 2011-12-24 MED ORDER — TAMSULOSIN HCL 0.4 MG PO CAPS: 0.8000 mg | ORAL_CAPSULE | Freq: Every day | ORAL | Status: DC

## 2011-12-24 MED ORDER — PRAVASTATIN SODIUM 80 MG PO TABS: 80.0000 mg | ORAL_TABLET | Freq: Every day | ORAL | Status: DC

## 2011-12-24 NOTE — Progress Notes (Signed)
OFFICE NOTE  12/24/2011  CC:  Chief Complaint  Patient presents with  . Cough    x 1 week     HPI: Patient is a 61 y.o. Caucasian male who is here for congestion/cough. Pt presents complaining of respiratory symptoms for 7 days.  Mostly nasal congestion/runny nose, sneezing, and PND cough.  Worst symptoms seems to be the head and face congestion.  Lately the symptoms seem to be worsening. No fevers, no wheezing, and no SOB.  No pain in face or teeth.  No significant HA.  ST mild at most.  Symptoms made worse by night time and cold air.  Symptoms improved a bit during daytime. Smoker? no Recent sick contact? unknown Muscle or joint aches? yes Flu shot this season at least 2 wks ago? yes  ROS: no n/v/d or abdominal pain.  No rash.  No neck stiffness.   +Mild fatigue.  +Mild appetite loss.   Pertinent PMH:  Past Medical History  Diagnosis Date  . History of scarlet fever     age 13  . 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 Keratoconus     dx'd 1973  . Depression     Hospitalized for suicidal ideation 55.  Has been on lexapro since 10/2008.  [name] Hyperlipidemia, mixed 1993  . Hypertension 2009    "marked chronic cardiomegaly" on CXR 01/2009 per old records.  . Diabetes mellitus type II     Dx'd approximately 06/2008  . OSA (obstructive sleep apnea) 2004    intolerant of CPAP  . History of pneumonia 2009    Hospitalized  . DDD (degenerative disc disease), lumbar 2010    laminectomy 01/2009  . BPH (benign prostatic hypertrophy)   . Insomnia   . Rhinitis, chronic   . Erectile dysfunction    Past surgical, social, and family history reviewed and no changes noted since last office visit.  MEDS:  Outpatient Prescriptions Prior to Visit  Medication Sig Dispense Refill  . aspirin 325 MG tablet Take 325 mg by mouth daily.        . Colesevelam HCl (WELCHOL) 3.75 G PACK Take 3.75 g by mouth daily.  90 each  6  .  Dextromethorphan-Guaifenesin (MUCINEX DM MAXIMUM STRENGTH) 60-1200 MG per 12 hr tablet Take 2 tablets by mouth at bedtime as needed.        [name] escitalopram (LEXAPRO) 10 MG tablet take 1 tablet by mouth once daily  30 tablet  2  . finasteride (PROSCAR) 5 MG tablet Take 1 tablet (5 mg total) by mouth daily.  30 tablet  3  . glimepiride (AMARYL) 1 MG tablet Take 1 mg by mouth daily before breakfast.        . lisinopril (PRINIVIL,ZESTRIL) 10 MG tablet Take 10 mg by mouth daily.        . metFORMIN (GLUCOPHAGE) 500 MG tablet Take 500 mg by mouth 2 (two) times daily with a meal.        . pravastatin (PRAVACHOL) 80 MG tablet take 1 tablet by mouth at bedtime if needed  30 tablet  2  . pseudoephedrine (SUDAFED) 30 MG tablet Take 60 mg by mouth at bedtime as needed.        . sildenafil (VIAGRA) 50 MG tablet Take 50 mg by mouth daily as needed.        . Tamsulosin HCl (FLOMAX) 0.4 MG CAPS Take 0.8 mg by mouth  at bedtime.        . TRILIPIX 135 MG capsule take 1 capsule by mouth once daily  30 capsule  2  . vitamin B-12 (CYANOCOBALAMIN) 1000 MCG tablet Take 1,000 mcg by mouth daily.        . zaleplon (SONATA) 10 MG capsule Take 10 mg by mouth at bedtime as needed.        . mometasone (NASONEX) 50 MCG/ACT nasal spray Place 2 sprays into the nose daily.        . Naproxen Sodium (ALEVE) 220 MG CAPS Take 2 capsules by mouth at bedtime as needed.          PE: Blood pressure 143/90, pulse 106, temperature 98 F (36.7 C), temperature source Temporal, height 5\' 10"  (1.778 m), weight 274 lb (124.286 kg), SpO2 95.00%. VS: noted--normal. Gen: alert, NAD, NONTOXIC APPEARING. HEENT: eyes without injection, drainage, or swelling.  Ears: EACs clear, TMs with normal light reflex and landmarks.  Nose: mucosa injected, dried/yellowish crusty mucous adherent to mucosa, turbinates edematous.  No purulent d/c.  No paranasal sinus TTP.  No facial swelling.  Throat and mouth without focal lesion.  No pharyngial swelling, erythema,  or exudate.   Neck: supple, no LAD.   LUNGS: CTA bilat, nonlabored resps.  Aeration is great.  NO post-exhalational coughing. CV: RRR, soft systolic murmur at upper sternal area, S1 and S2 normal EXT: no c/c/e SKIN: no rash  IMPRESSION AND PLAN: Acute sinusitis, with PND cough.  No sign of RAD or pneumonia. Plan: azithromycin x 5d, saline nasal spray disussed, avoid OTC decongestants. He has to avoid antihistamines b/c they have a profound effect on his mood (depression), so I cannot rx hydrocodone+ homotropine for hs use.  In it's place, I rx'd vicodin 5/500, 1-2 tabs po qhs. RF'd many of his chronic meds today.  FOLLOW UP: he has f/u scheduled in February but says he will have to reschedule this b/c he's going on vacation at that time.

## 2012-01-06 ENCOUNTER — Telehealth: Payer: Self-pay | Admitting: *Deleted

## 2012-01-06 NOTE — Telephone Encounter (Signed)
Pt was seen on 12/24/11.  He is doing better and has completed antibiotics.  His cough lingers, but is improving.  Cough is occasionally productive.  Pt continues with hydrocodone at night and Mucinex DM as directed.  Pt had bout of pneumonia a few years ago and is being cautious.  Pt is not requesting meds, but would like to know if he needs to be seen again.  Pt does not sound congested or short of breath while talking.  Please advise.

## 2012-01-06 NOTE — Telephone Encounter (Signed)
Reassure patient that his recovery sounds appropriate and I would expect things to gradually resolve over the next couple of weeks.  thx

## 2012-01-07 NOTE — Telephone Encounter (Signed)
Pt notified and is agreeable. 

## 2012-01-08 ENCOUNTER — Other Ambulatory Visit (INDEPENDENT_AMBULATORY_CARE_PROVIDER_SITE_OTHER): Payer: BC Managed Care – PPO

## 2012-01-08 DIAGNOSIS — E782 Mixed hyperlipidemia: Secondary | ICD-10-CM

## 2012-01-08 DIAGNOSIS — E291 Testicular hypofunction: Secondary | ICD-10-CM

## 2012-01-08 DIAGNOSIS — I10 Essential (primary) hypertension: Secondary | ICD-10-CM

## 2012-01-08 DIAGNOSIS — R5383 Other fatigue: Secondary | ICD-10-CM

## 2012-01-08 DIAGNOSIS — R5381 Other malaise: Secondary | ICD-10-CM

## 2012-01-08 DIAGNOSIS — E119 Type 2 diabetes mellitus without complications: Secondary | ICD-10-CM

## 2012-01-08 DIAGNOSIS — N529 Male erectile dysfunction, unspecified: Secondary | ICD-10-CM

## 2012-01-08 LAB — BASIC METABOLIC PANEL
BUN: 19 mg/dL (ref 6–23)
CO2: 26 mEq/L (ref 19–32)
Calcium: 9.3 mg/dL (ref 8.4–10.5)
Chloride: 101 mEq/L (ref 96–112)
Creatinine, Ser: 0.9 mg/dL (ref 0.4–1.5)
GFR: 89.48 mL/min (ref >60.00)
Glucose, Bld: 141 mg/dL — ABNORMAL HIGH (ref 70–99)
Potassium: 4 mEq/L (ref 3.5–5.1)
Sodium: 136 mEq/L (ref 135–145)

## 2012-01-08 LAB — LIPID PANEL
Cholesterol: 223 mg/dL — ABNORMAL HIGH (ref 0–200)
HDL: 43.7 mg/dL (ref >39.00)
Total CHOL/HDL Ratio: 5
Triglycerides: 282 mg/dL — ABNORMAL HIGH (ref 0.0–149.0)
VLDL: 56.4 mg/dL — ABNORMAL HIGH (ref 0.0–40.0)

## 2012-01-08 LAB — MICROALBUMIN / CREATININE URINE RATIO
Creatinine,U: 206.9 mg/dL
Microalb Creat Ratio: 0.5 mg/g (ref 0.0–30.0)
Microalb, Ur: 1.1 mg/dL (ref 0.0–1.9)

## 2012-01-08 LAB — LDL CHOLESTEROL, DIRECT: Direct LDL: 135.7 mg/dL

## 2012-01-08 LAB — TESTOSTERONE: Testosterone: 120.96 ng/dL — ABNORMAL LOW (ref 350.00–890.00)

## 2012-01-08 LAB — HEMOGLOBIN A1C: Hgb A1c MFr Bld: 7.4 % — ABNORMAL HIGH (ref 4.6–6.5)

## 2012-01-11 ENCOUNTER — Encounter: Payer: Self-pay | Admitting: Family Medicine

## 2012-01-11 DIAGNOSIS — E291 Testicular hypofunction: Secondary | ICD-10-CM

## 2012-01-11 HISTORY — DX: Testicular hypofunction: E29.1

## 2012-01-12 LAB — LUTEINIZING HORMONE: LH: 1.6 m[IU]/mL (ref 1.5–9.3)

## 2012-01-12 LAB — PROLACTIN: Prolactin: 7.4 ng/mL (ref 2.1–17.1)

## 2012-01-13 ENCOUNTER — Encounter: Payer: Self-pay | Admitting: Family Medicine

## 2012-01-13 ENCOUNTER — Other Ambulatory Visit: Payer: Self-pay | Admitting: Family Medicine

## 2012-01-13 DIAGNOSIS — E785 Hyperlipidemia, unspecified: Secondary | ICD-10-CM

## 2012-01-13 MED ORDER — TESTOSTERONE 30 MG/ACT TD SOLN: 30.0000 mg | Freq: Every day | TRANSDERMAL | Status: DC

## 2012-01-13 MED ORDER — GLIMEPIRIDE 2 MG PO TABS: 2.0000 mg | ORAL_TABLET | Freq: Every day | ORAL | Status: DC

## 2012-01-13 NOTE — Progress Notes (Signed)
Quick Note:  Discussed results with pt: will try changing back to crestor 10mg  and take three times per week initially. If musculoskeletal side effects not an issue on this dosing then will push to qd dosing. We'll work on other cholest/trig meds once we get him tolerating a statin/LDL coming down. Next, I advised him to push his glimeperide to 2mg  qAM. He is already taking TWO of the 500mg  metformin tabs twice daily. Finally, his testosterone needs replacement so we discussed options and decided on topical gel. Will send all rx's to pharmacy and see how he's getting along on these at upcoming f/u visit later this month--PM ______

## 2012-01-15 ENCOUNTER — Ambulatory Visit: Payer: BC Managed Care – PPO | Admitting: Family Medicine

## 2012-01-21 ENCOUNTER — Telehealth: Payer: Self-pay | Admitting: Family Medicine

## 2012-01-21 ENCOUNTER — Ambulatory Visit: Payer: BC Managed Care – PPO | Admitting: Family Medicine

## 2012-01-21 NOTE — Telephone Encounter (Signed)
I have attempted to contact this patient by phone with the following results: no answer after 7 rings.  

## 2012-01-21 NOTE — Telephone Encounter (Signed)
Dr. Milinda Cave please advise if pt just needs labs or does he need OV also.

## 2012-01-21 NOTE — Telephone Encounter (Signed)
Fasting labs AND office visit in 56mo---thx

## 2012-02-12 NOTE — Telephone Encounter (Signed)
Left a detailed message on patients cell phone

## 2012-03-24 ENCOUNTER — Other Ambulatory Visit: Payer: Self-pay | Admitting: Family Medicine

## 2012-04-12 ENCOUNTER — Other Ambulatory Visit: Payer: Self-pay | Admitting: Family Medicine

## 2012-04-12 DIAGNOSIS — IMO0001 Reserved for inherently not codable concepts without codable children: Secondary | ICD-10-CM

## 2012-04-12 DIAGNOSIS — E785 Hyperlipidemia, unspecified: Secondary | ICD-10-CM

## 2012-04-12 DIAGNOSIS — E291 Testicular hypofunction: Secondary | ICD-10-CM

## 2012-04-12 NOTE — Telephone Encounter (Signed)
Advised pt.  Pt states he has not started testosterone yet.  He will hold amaryl.

## 2012-04-12 NOTE — Telephone Encounter (Signed)
eScribe request for refill on finasteride. Last seen on 10/14/12 Follow up needed 3 months later.  Had to reschedule.  PC to pt to advise he needs follow up with Dr. Milinda Cave and this is scheduled.  Pt also states it is time for labs.  Last labs on 2/8.  DM and cholesterol meds changed.  Please advise what labs pt may need.  Thanks. RX sent

## 2012-04-12 NOTE — Telephone Encounter (Signed)
Lab orders entered.  He needs to be fasting, he can SKIP his amaryl the day of his labs, and he should try to time the blood draw for about 2 hours after he applies his testosterone gel.--thx

## 2012-04-13 ENCOUNTER — Other Ambulatory Visit (INDEPENDENT_AMBULATORY_CARE_PROVIDER_SITE_OTHER): Payer: BC Managed Care – PPO

## 2012-04-13 DIAGNOSIS — E785 Hyperlipidemia, unspecified: Secondary | ICD-10-CM

## 2012-04-13 DIAGNOSIS — IMO0001 Reserved for inherently not codable concepts without codable children: Secondary | ICD-10-CM

## 2012-04-13 DIAGNOSIS — E291 Testicular hypofunction: Secondary | ICD-10-CM

## 2012-04-13 LAB — LIPID PANEL
Cholesterol: 251 mg/dL — ABNORMAL HIGH (ref 0–200)
HDL: 48.5 mg/dL (ref >39.00)
Total CHOL/HDL Ratio: 5
Triglycerides: 395 mg/dL — ABNORMAL HIGH (ref 0.0–149.0)
VLDL: 79 mg/dL — ABNORMAL HIGH (ref 0.0–40.0)

## 2012-04-13 LAB — CBC WITH DIFFERENTIAL/PLATELET
Basophils Absolute: 0 10*3/uL (ref 0.0–0.1)
Basophils Relative: 0.4 % (ref 0.0–3.0)
Eosinophils Absolute: 0.2 10*3/uL (ref 0.0–0.7)
Eosinophils Relative: 2.6 % (ref 0.0–5.0)
HCT: 40.1 % (ref 39.0–52.0)
Hemoglobin: 13.3 g/dL (ref 13.0–17.0)
Lymphocytes Relative: 37 % (ref 12.0–46.0)
Lymphs Abs: 2.6 10*3/uL (ref 0.7–4.0)
MCHC: 33.1 g/dL (ref 30.0–36.0)
MCV: 89.7 fl (ref 78.0–100.0)
Monocytes Absolute: 0.5 10*3/uL (ref 0.1–1.0)
Monocytes Relative: 6.8 % (ref 3.0–12.0)
Neutro Abs: 3.8 10*3/uL (ref 1.4–7.7)
Neutrophils Relative %: 53.2 % (ref 43.0–77.0)
Platelets: 220 10*3/uL (ref 150.0–400.0)
RBC: 4.47 Mil/uL (ref 4.22–5.81)
RDW: 13.2 % (ref 11.5–14.6)
WBC: 7.1 10*3/uL (ref 4.5–10.5)

## 2012-04-13 LAB — TESTOSTERONE: Testosterone: 87.17 ng/dL — ABNORMAL LOW (ref 350.00–890.00)

## 2012-04-13 LAB — HEMOGLOBIN A1C: Hgb A1c MFr Bld: 7.2 % — ABNORMAL HIGH (ref 4.6–6.5)

## 2012-04-13 LAB — LDL CHOLESTEROL, DIRECT: Direct LDL: 139.9 mg/dL

## 2012-04-18 ENCOUNTER — Encounter: Payer: Self-pay | Admitting: Family Medicine

## 2012-04-18 ENCOUNTER — Ambulatory Visit (INDEPENDENT_AMBULATORY_CARE_PROVIDER_SITE_OTHER): Payer: BC Managed Care – PPO | Admitting: Family Medicine

## 2012-04-18 VITALS — BP 149/92 | HR 85 | Temp 97.8°F | Ht 70.0 in | Wt 282.0 lb

## 2012-04-18 DIAGNOSIS — IMO0001 Reserved for inherently not codable concepts without codable children: Secondary | ICD-10-CM

## 2012-04-18 DIAGNOSIS — E291 Testicular hypofunction: Secondary | ICD-10-CM

## 2012-04-18 DIAGNOSIS — E119 Type 2 diabetes mellitus without complications: Secondary | ICD-10-CM

## 2012-04-18 DIAGNOSIS — Z860101 Personal history of adenomatous and serrated colon polyps: Secondary | ICD-10-CM

## 2012-04-18 DIAGNOSIS — Z86010 Personal history of colonic polyps: Secondary | ICD-10-CM

## 2012-04-18 DIAGNOSIS — E785 Hyperlipidemia, unspecified: Secondary | ICD-10-CM

## 2012-04-18 MED ORDER — METFORMIN HCL 500 MG PO TABS: 1000.0000 mg | ORAL_TABLET | Freq: Two times a day (BID) | ORAL | Status: DC

## 2012-04-18 MED ORDER — LISINOPRIL 10 MG PO TABS: 10.0000 mg | ORAL_TABLET | Freq: Every day | ORAL | Status: DC

## 2012-04-18 MED ORDER — CHOLINE FENOFIBRATE 135 MG PO CPDR: 135.0000 mg | DELAYED_RELEASE_CAPSULE | Freq: Every day | ORAL | Status: DC

## 2012-04-18 MED ORDER — FINASTERIDE 5 MG PO TABS: 5.0000 mg | ORAL_TABLET | Freq: Every day | ORAL | Status: DC

## 2012-04-18 NOTE — Assessment & Plan Note (Signed)
Start axiron as we had planned last f/u visit.  Therapeutic expectations and side effect profile of medication discussed today.  Patient's questions answered.  We'll recheck testost at next f/u in 79mo.

## 2012-04-18 NOTE — Assessment & Plan Note (Signed)
Referral for local GI MD was made 07/2011 but he has been unable to make this happen yet due to life circumstances/illnesses getting in the way. He now has a few weddings and cataract surgery on the near horizon so he'll continue to put this off for now.  We'll retouch on this at each f/u and we'll have to make a new referral to [facility] when he's ready b/c the old one has expired.

## 2012-04-18 NOTE — Assessment & Plan Note (Signed)
STOP pravastatin. Take crestor 10mg  qd x 2 wks, then if tolerating this fine I recommended he increase to 20mg  qd --samples necessary for this dosing for the next 4 mo were given today.  Continue trilipix. Recheck lipids 4 mo.

## 2012-04-18 NOTE — Assessment & Plan Note (Addendum)
No changes today in meds.  Encouraged tighter/more restrictive diet and increase in exercise. If not improved next f/u visit I'll recommend adding GLP-1 agonist vs insulin (vs increase in his SU). Foot exam today normal.  Eye exam UTD (but he is getting cataract surgery soon per his report today), as is urine microalb/cr. Need to offer pneumovax next f/u visit.

## 2012-04-18 NOTE — Progress Notes (Signed)
OFFICE VISIT  04/18/2012   CC:  Chief Complaint  Patient presents with  . Follow-up    HTN, DM-not checking sugars     HPI:    Patient is a 61 y.o. Caucasian male who presents for f/u DM 2, hyperlipidemia, and hypogonadism. Sites life has been very busy last few months, several things not followed-through with according to last visit's plans.  Insists that his diet is good regarding cholesterol/fats, but admits that from a carb standpoint he could make things stricter, and this is the way he wishes to proceed right now regarding diabetes treatment rather than adding any new med or up-titration of current med.   He is not monitoring his glucoses as instructed. He has been taking pravastatin 80mg  PLUS the 10mg  crestor qod--he mistakenly thought i had instructed him to stay on the pravastatin while he started the crestor.  He denies any abd pain, bloating, or myalgias. He did not start axiron b/c of fears about it transferring to wife or dogs (who often sleep with him and lick in his underarm areas), he wanted to discuss things here today before starting it.   Past Medical History  Diagnosis Date  . History of scarlet fever     age 56  . 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 Keratoconus     dx'd 1973  . Depression     Hospitalized for suicidal ideation 8.  Has been on lexapro since 10/2008.  [name] Hyperlipidemia, mixed 1993  . Hypertension 2009    "marked chronic cardiomegaly" on CXR 01/2009 per old records.  . Diabetes mellitus type II     Dx'd approximately 06/2008  . OSA (obstructive sleep apnea) 2004    intolerant of CPAP  . History of pneumonia 2009    Hospitalized  . DDD (degenerative disc disease), lumbar 2010    laminectomy 01/2009  . BPH (benign prostatic hypertrophy)   . Insomnia   . Rhinitis, chronic   . Erectile dysfunction   . Hypogonadism, male 01/11/2012    Axiron trial started 03/2012    Past Surgical History    Procedure Date  . Appendectomy 1972  . Vasectomy 1994  . 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 Tonsillectomy and adenoidectomy     age 62  . Urethral dilation     2 as a child, one as an adult  . Colonoscopy X 3    Most recent was 2005, with removal of polyps in the first two.  [name] Electrocardiogram 01/29/2009    NORMAL  . Lumbar laminectomy 01/2009    Outpatient Prescriptions Prior to Visit  Medication Sig Dispense Refill  . aspirin 325 MG tablet Take 325 mg by mouth daily.        [name] escitalopram (LEXAPRO) 10 MG tablet Take 1 tablet (10 mg total) by mouth daily.  90 tablet  0  . glimepiride (AMARYL) 2 MG tablet Take 1 tablet (2 mg total) by mouth daily before breakfast.  30 tablet  6  . mometasone (NASONEX) 50 MCG/ACT nasal spray Place two sprays in each nostril once daily  51 g  0  . Naproxen Sodium (ALEVE) 220 MG CAPS Take 2 capsules by mouth at bedtime as needed.        . pseudoephedrine (SUDAFED) 30 MG tablet Take 60 mg by mouth at bedtime as needed.        . sildenafil (  VIAGRA) 50 MG tablet Take 50 mg by mouth daily as needed.        . Tamsulosin HCl (FLOMAX) 0.4 MG CAPS Take 2 capsules (0.8 mg total) by mouth at bedtime.  180 capsule  0  . Testosterone (AXIRON) 30 MG/ACT SOLN Place 30 mg onto the skin daily. One pump (30mg ) rubbed into each underarm region (two pumps total per day) once each morning  90 mL  5  . vitamin B-12 (CYANOCOBALAMIN) 1000 MCG tablet Take 1,000 mcg by mouth daily.        . zaleplon (SONATA) 10 MG capsule Take 1 capsule (10 mg total) by mouth at bedtime as needed.  12 capsule  5  . Choline Fenofibrate (TRILIPIX) 135 MG capsule Take 1 capsule (135 mg total) by mouth daily.  90 capsule  0  . finasteride (PROSCAR) 5 MG tablet take 1 tablet by mouth once daily  90 tablet  0  . lisinopril (PRINIVIL,ZESTRIL) 10 MG tablet Take 1 tablet (10 mg total) by mouth daily.  90 tablet  0  . metFORMIN (GLUCOPHAGE) 500 MG tablet take 2 tablets by mouth twice a day  120 tablet   0  . pravastatin (PRAVACHOL) 80 MG tablet Take 1 tablet (80 mg total) by mouth daily.  90 tablet  0  . Colesevelam HCl (WELCHOL) 3.75 G PACK Take 3.75 g by mouth daily.  90 each  6  . Dextromethorphan-Guaifenesin (MUCINEX DM MAXIMUM STRENGTH) 60-1200 MG per 12 hr tablet Take 2 tablets by mouth at bedtime as needed.        [name] HYDROcodone-acetaminophen (VICODIN) 5-500 MG per tablet 1-2 tabs po qhs prn pain  20 tablet  0    Allergies  Allergen Reactions  . Simvastatin Other (See Comments)    myalgias  . Antihistamines, Diphenhydramine-Type Other (See Comments)    depression  . Codeine Nausea Only  . Penicillins Hives  . Sulfa Antibiotics Hives    ROS As per HPI  PE: Blood pressure 149/92, pulse 85, temperature 97.8 F (36.6 C), temperature source Temporal, height 5\' 10"  (1.778 m), weight 282 lb (127.914 kg). Gen: Alert, well appearing.  Patient is oriented to person, place, time, and situation. FEET: no deformity.  Nails not thickened or in-grown.  No areas of skin breakdown or callus or erythema. No edema.  DP and PT pulses 2+ bilat. Monofilament testing showed normal response in all areas bilaterally.  LABS:  Lab Results  Component Value Date   TSH 1.44 07/09/2011   Lab Results  Component Value Date   WBC 7.1 04/13/2012   HGB 13.3 04/13/2012   HCT 40.1 04/13/2012   MCV 89.7 04/13/2012   PLT 220.0 04/13/2012   Lab Results  Component Value Date   CREATININE 0.9 01/08/2012   BUN 19 01/08/2012   NA 136 01/08/2012   K 4.0 01/08/2012   CL 101 01/08/2012   CO2 26 01/08/2012   Lab Results  Component Value Date   ALT 22 07/09/2011   AST 16 07/09/2011   ALKPHOS 42 07/09/2011   BILITOT 0.9 07/09/2011   Lab Results  Component Value Date   CHOL 251* 04/13/2012   Lab Results  Component Value Date   HDL 48.50 04/13/2012   No results found for this basename: [facility]   Lab Results  Component Value Date   TRIG 395.0* 04/13/2012   Lab Results  Component Value Date   CHOLHDL 5 04/13/2012    Lab Results  Component Value Date  PSA 0.12 07/09/2011   IMPRESSION AND PLAN:  Type II or unspecified type diabetes mellitus without mention of complication, not stated as uncontrolled No changes today in meds.  Encouraged tighter/more restrictive diet and increase in exercise. If not improved next f/u visit I'll recommend adding GLP-1 agonist vs insulin (vs increase in his SU). Foot exam today normal.  Eye exam UTD (but he is getting cataract surgery soon per his report today), as is urine microalb/cr. Need to offer pneumovax next f/u visit.  Hyperlipidemia STOP pravastatin. Take crestor 10mg  qd x 2 wks, then if tolerating this fine I recommended he increase to 20mg  qd --samples necessary for this dosing for the next 4 mo were given today.  Continue trilipix. Recheck lipids 4 mo.  Hypogonadism, male Start axiron as we had planned last f/u visit.  Therapeutic expectations and side effect profile of medication discussed today.  Patient's questions answered.  We'll recheck testost at next f/u in 49mo.   Hx of adenomatous colonic polyps Referral for local GI MD was made 07/2011 but he has been unable to make this happen yet due to life circumstances/illnesses getting in the way. He now has a few weddings and cataract surgery on the near horizon so he'll continue to put this off for now.  We'll retouch on this at each f/u and we'll have to make a new referral to [facility] when he's ready b/c the old one has expired.     FOLLOW UP: Return in about 4 months (around 08/19/2012) for DM, hyperlipidema, hypogonadism f/u.

## 2012-06-09 ENCOUNTER — Other Ambulatory Visit: Payer: Self-pay | Admitting: Family Medicine

## 2012-06-15 ENCOUNTER — Other Ambulatory Visit: Payer: Self-pay | Admitting: Family Medicine

## 2012-06-16 NOTE — Telephone Encounter (Signed)
eScribe request for refill on metformin Last seen on 04/18/12 Follow up 07/2012 RX sent

## 2012-06-30 ENCOUNTER — Other Ambulatory Visit: Payer: Self-pay | Admitting: Family Medicine

## 2012-07-01 NOTE — Telephone Encounter (Signed)
eScribe request for refill on ESCITALOPRAM  Last filled - 12/24/11, #90 X 0 Last seen on 04/18/12  Follow up 07/2012  RX sent

## 2012-09-20 LAB — HM DIABETES EYE EXAM: HM Diabetic Eye Exam: NORMAL

## 2012-10-10 ENCOUNTER — Other Ambulatory Visit: Payer: Self-pay | Admitting: Family Medicine

## 2012-10-10 NOTE — Telephone Encounter (Signed)
eScribe request for refill on TAMSULOSIN Last filled - 06/10/12, #180 X 0 Last seen on - 04/18/12 Follow up - 4 MONTHS, last PSA 07/09/11 RX SENT with message appt needed. MyChart message sent to pt notifying of need for follow up appt.

## 2012-11-10 ENCOUNTER — Encounter: Payer: Self-pay | Admitting: Family Medicine

## 2012-11-10 ENCOUNTER — Ambulatory Visit (INDEPENDENT_AMBULATORY_CARE_PROVIDER_SITE_OTHER): Payer: BC Managed Care – PPO | Admitting: Family Medicine

## 2012-11-10 VITALS — BP 134/94 | HR 98 | Ht 70.0 in | Wt 285.0 lb

## 2012-11-10 DIAGNOSIS — Z23 Encounter for immunization: Secondary | ICD-10-CM

## 2012-11-10 DIAGNOSIS — IMO0002 Reserved for concepts with insufficient information to code with codable children: Secondary | ICD-10-CM

## 2012-11-10 DIAGNOSIS — S76219A Strain of adductor muscle, fascia and tendon of unspecified thigh, initial encounter: Secondary | ICD-10-CM

## 2012-11-10 DIAGNOSIS — IMO0001 Reserved for inherently not codable concepts without codable children: Secondary | ICD-10-CM

## 2012-11-10 LAB — COMPREHENSIVE METABOLIC PANEL
ALT: 26 U/L (ref 0–53)
AST: 22 U/L (ref 0–37)
Albumin: 4.3 g/dL (ref 3.5–5.2)
Alkaline Phosphatase: 36 U/L — ABNORMAL LOW (ref 39–117)
BUN: 18 mg/dL (ref 6–23)
CO2: 23 mEq/L (ref 19–32)
Calcium: 9 mg/dL (ref 8.4–10.5)
Chloride: 100 mEq/L (ref 96–112)
Creatinine, Ser: 1 mg/dL (ref 0.4–1.5)
GFR: 81.98 mL/min (ref >60.00)
Glucose, Bld: 197 mg/dL — ABNORMAL HIGH (ref 70–99)
Potassium: 3.9 mEq/L (ref 3.5–5.1)
Sodium: 133 mEq/L — ABNORMAL LOW (ref 135–145)
Total Bilirubin: 0.8 mg/dL (ref 0.3–1.2)
Total Protein: 7.2 g/dL (ref 6.0–8.3)

## 2012-11-10 LAB — LIPID PANEL
Cholesterol: 210 mg/dL — ABNORMAL HIGH (ref 0–200)
HDL: 39.8 mg/dL (ref >39.00)
Total CHOL/HDL Ratio: 5
Triglycerides: 357 mg/dL — ABNORMAL HIGH (ref 0.0–149.0)
VLDL: 71.4 mg/dL — ABNORMAL HIGH (ref 0.0–40.0)

## 2012-11-10 LAB — HEMOGLOBIN A1C: Hgb A1c MFr Bld: 8.3 % — ABNORMAL HIGH (ref 4.6–6.5)

## 2012-11-10 LAB — LDL CHOLESTEROL, DIRECT: Direct LDL: 127.5 mg/dL

## 2012-11-10 NOTE — Assessment & Plan Note (Signed)
Refer to PT in [place], where he lives. Celebrex samples given, #12 of the 200mg  caps, take 1 per day until these are gone.  Stop other NSAID while on this. Therapeutic expectations and side effect profile of medication discussed today.  Patient's questions answered. Emphasized importance of heat application.

## 2012-11-10 NOTE — Patient Instructions (Signed)
Apply heating pain twice a day for 30 minutes at a time. Take celebrex 200mg  once daily with food for 12 days (samples).

## 2012-11-10 NOTE — Progress Notes (Signed)
OFFICE NOTE  11/10/2012  CC:  Chief Complaint  Patient presents with  . Groin Pain    x 2 weeks     HPI: Patient is a 61 y.o. Caucasian male who is here for groin pain.   Onset about 2 wks ago, upper, inner thigh.  (He adds that prior to this he had begun to feel his old back pain extending into left hip to the upper lateral thigh region.  This is intermittent pain and not as intense as when he had his lumbar laminectomy).  The thigh pain is not present while sitting and resting, but if he abducts or adducts his left leg he feels the pain.  Walking certain ways also exacerbates it.  He feels no lump or swelling in the area, sees no discoloration. Groin pain initially 4/10, he then did some straight leg lifts on floor and this increased the pain to 6/10 intensity. Yesterday he bent/knelt down to get something and it began hurting severely-9/10 intensity for a while then calmed down slowly up to this morning.  Currently 6/10 intensity.  Of note, after last visit he never started his testosterone gel--feels a bit overwhelmed with all the medical responsibilities lately--still hasn't scheduled his colonoscopy for f/u adenomatous polyps, has some eye MD issues lately.    Of note, c/o diminished ability to hear when in conversation in crowds (long term), lately just in conversation with wife.    Pertinent PMH:  Past Medical History  Diagnosis Date  . History of scarlet fever     age 25  . 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 Keratoconus     dx'd 1973  . Depression     Hospitalized for suicidal ideation 60.  Has been on lexapro since 10/2008.  [name] Hyperlipidemia, mixed 1993  . Hypertension 2009    "marked chronic cardiomegaly" on CXR 01/2009 per old records.  . Diabetes mellitus type II     Dx'd approximately 06/2008  . OSA (obstructive sleep apnea) 2004    intolerant of CPAP  . History of pneumonia 2009    Hospitalized  . DDD  (degenerative disc disease), lumbar 2010    laminectomy 01/2009  . BPH (benign prostatic hypertrophy)   . Insomnia   . Rhinitis, chronic   . Erectile dysfunction   . 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 Appendectomy 1972  . Vasectomy 1994  . 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 Tonsillectomy and adenoidectomy     age 62  . Urethral dilation     2 as a child, one as an adult  . Colonoscopy X 3    Most recent was 2005, with removal of polyps in the first two.  [name] Electrocardiogram 01/29/2009    NORMAL  . Lumbar laminectomy 01/2009    MEDS:  Outpatient Prescriptions Prior to Visit  Medication Sig Dispense Refill  . aspirin 325 MG tablet Take 325 mg by mouth daily.        . Choline Fenofibrate (TRILIPIX) 135 MG capsule Take 1 capsule (135 mg total) by mouth daily.  90 capsule  1  . Dextromethorphan-Guaifenesin (MUCINEX DM MAXIMUM STRENGTH) 60-1200 MG per 12 hr tablet Take 2 tablets by mouth at bedtime as needed.        [name] escitalopram (LEXAPRO) 10 MG tablet take 1 tablet  by mouth once daily  90 tablet  3  . finasteride (PROSCAR) 5 MG tablet Take 1 tablet (5 mg total) by mouth daily.  90 tablet  1  . glimepiride (AMARYL) 2 MG tablet Take 1 tablet (2 mg total) by mouth daily before breakfast.  30 tablet  6  . lisinopril (PRINIVIL,ZESTRIL) 10 MG tablet Take 1 tablet (10 mg total) by mouth daily.  90 tablet  1  . metFORMIN (GLUCOPHAGE) 500 MG tablet take 2 tablets by mouth twice a day with food  120 tablet  5  . Multiple Vitamin (MULTIVITAMIN) tablet Take 1 tablet by mouth daily.      . Naproxen Sodium (ALEVE) 220 MG CAPS Take 2 capsules by mouth at bedtime as needed.        . Omega-3 Fatty Acids (FISH OIL) 1000 MG CAPS Take 1 capsule by mouth 2 (two) times daily.      . pseudoephedrine (SUDAFED) 30 MG tablet Take 60 mg by mouth at bedtime as needed.        . Tamsulosin HCl (FLOMAX) 0.4 MG CAPS Take 2 capsules (0.8 mg  total) by mouth at bedtime. MUST HAVE APPT FOR MORE REFILLS.  180 capsule  0  . vitamin B-12 (CYANOCOBALAMIN) 1000 MCG tablet Take 1,000 mcg by mouth daily.        . zaleplon (SONATA) 10 MG capsule Take 1 capsule (10 mg total) by mouth at bedtime as needed.  12 capsule  5  . mometasone (NASONEX) 50 MCG/ACT nasal spray Place two sprays in each nostril once daily  51 g  0  . sildenafil (VIAGRA) 50 MG tablet Take 50 mg by mouth daily as needed.        . [DISCONTINUED] Colesevelam HCl (WELCHOL) 3.75 G PACK Take 3.75 g by mouth daily.  90 each  6  . [DISCONTINUED] Testosterone (AXIRON) 30 MG/ACT SOLN Place 30 mg onto the skin daily. One pump (30mg ) rubbed into each underarm region (two pumps total per day) once each morning  90 mL  5   Last reviewed on 11/10/2012  8:44 AM by Jeoffrey Massed, MD  PE: Blood pressure 134/94, pulse 98, height 5\' 10"  (1.778 m), weight 285 lb (129.275 kg). Gen: Alert, well appearing, obese white male in NAD.  Patient is oriented to person, place, time, and situation. Low back nontender.   Left groin with TTP over medial musculotendinous complex proximally--mild intensity.  No cord, nodule, or mass palpable.  Testes/scrotum normal.  No rash/bruise. He has significant pain and limitation in ability to initiate left leg flexion, but once the leg is flexed a good amount the pain abates and he can do the rest of flexion without pain.  No pain with lower leg flexion/extension. He has 2+ pitting edema bilat in LE's, without skin changes.   IMPRESSION AND PLAN:  Groin strain Refer to PT in [place], where he lives. Celebrex samples given, #12 of the 200mg  caps, take 1 per day until these are gone.  Stop other NSAID while on this. Therapeutic expectations and side effect profile of medication discussed today.  Patient's questions answered. Emphasized importance of heat application.  Flu vaccine given IM today.  An After Visit Summary was printed and given to the  patient.  FOLLOW UP: At his earliest convenience for routine chronic medical illness (primarily DM 2, but we can also address his concern of hearing deficit).    ]

## 2012-11-13 ENCOUNTER — Other Ambulatory Visit: Payer: Self-pay | Admitting: Family Medicine

## 2012-11-18 ENCOUNTER — Encounter: Payer: Self-pay | Admitting: Family Medicine

## 2012-11-18 ENCOUNTER — Ambulatory Visit (INDEPENDENT_AMBULATORY_CARE_PROVIDER_SITE_OTHER): Payer: BC Managed Care – PPO | Admitting: Family Medicine

## 2012-11-18 VITALS — BP 141/95 | HR 81 | Temp 98.0°F | Ht 70.0 in | Wt 286.0 lb

## 2012-11-18 DIAGNOSIS — I10 Essential (primary) hypertension: Secondary | ICD-10-CM

## 2012-11-18 DIAGNOSIS — IMO0001 Reserved for inherently not codable concepts without codable children: Secondary | ICD-10-CM

## 2012-11-18 DIAGNOSIS — S76219A Strain of adductor muscle, fascia and tendon of unspecified thigh, initial encounter: Secondary | ICD-10-CM

## 2012-11-18 DIAGNOSIS — Z23 Encounter for immunization: Secondary | ICD-10-CM

## 2012-11-18 DIAGNOSIS — E785 Hyperlipidemia, unspecified: Secondary | ICD-10-CM

## 2012-11-18 DIAGNOSIS — IMO0002 Reserved for concepts with insufficient information to code with codable children: Secondary | ICD-10-CM

## 2012-11-18 MED ORDER — LISINOPRIL 20 MG PO TABS: 20.0000 mg | ORAL_TABLET | Freq: Every day | ORAL | Status: DC

## 2012-11-18 MED ORDER — ROSUVASTATIN CALCIUM 20 MG PO TABS: 20.0000 mg | ORAL_TABLET | Freq: Every day | ORAL | Status: DC

## 2012-11-18 MED ORDER — LIRAGLUTIDE 18 MG/3ML ~~LOC~~ SOLN: SUBCUTANEOUS | Status: DC

## 2012-11-18 NOTE — Assessment & Plan Note (Signed)
Improving with PT

## 2012-11-18 NOTE — Assessment & Plan Note (Signed)
Add Victoza; instructions given to start at 0.6mg  qd and increase to 1.2 mg qd after 7d. Therapeutic expectations and side effect profile of medication discussed today.  Patient's questions answered. Continue other current diabetic meds, diet, and increase exercise. Pneumovax given today.  He is UTD on his diabetic complication monitoring. He'll monitor fasting and one postprandial glucose daily for the next 2 wks and bring them in for review at o/v with me.

## 2012-11-18 NOTE — Patient Instructions (Addendum)
Check your sugar every morning before you eat or drink anything.  Check sugar again 2 hours after any meal (largest meal is best) daily.  Record these numbers and bring in for review with me in 2 wks.  Take your Victoza once every day: start at 0.6mg  injection and after 7d you may increase to the 1.2 mg injection once daily.

## 2012-11-18 NOTE — Progress Notes (Signed)
OFFICE NOTE  11/18/2012  CC:  Chief Complaint  Patient presents with  . Follow-up    DM, Victoza teaching     HPI: Patient is a 61 y.o. Caucasian male who is here for 7 mo f/u DM 2, Hyperlipidemia, HTN. After labs showed A1c was up to 8.3% (this month), we decided to add victoza to his regimen.  Nurse did injection education with pt today and he gave himself his first injection while in the office today.        Also, his triglycerides were still in the 300's and LDL was 123.  He has been taking some crestor 10mg  samples that I gave him 7 mo ago--obviously not taking this consistently.  Also taking trilipix.    No complaints today.  Says his recent groin strain is improving well with the PT and home exercises. He tries to adhere to a diabetic diet but admits to frequent dietary indiscretion.  He has not been exercising consistently but plans on restarting daily walking.  Pertinent PMH:  Past Medical History  Diagnosis Date  . History of scarlet fever     age 20  . 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 Keratoconus     dx'd 1973  . Depression     Hospitalized for suicidal ideation 63.  Has been on lexapro since 10/2008.  [name] Hyperlipidemia, mixed 1993  . Hypertension 2009    "marked chronic cardiomegaly" on CXR 01/2009 per old records.  . Diabetes mellitus type II     Dx'd approximately 06/2008  . OSA (obstructive sleep apnea) 2004    intolerant of CPAP  . History of pneumonia 2009    Hospitalized  . DDD (degenerative disc disease), lumbar 2010    laminectomy 01/2009  . BPH (benign prostatic hypertrophy)   . Insomnia   . Rhinitis, chronic   . Erectile dysfunction   . 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  MEDS:  Outpatient Prescriptions Prior to Visit  Medication Sig Dispense Refill  . aspirin 325 MG  tablet Take 325 mg by mouth daily.        . Choline Fenofibrate (TRILIPIX) 135 MG capsule Take 1 capsule (135 mg total) by mouth daily.  90 capsule  1  . Dextromethorphan-Guaifenesin (MUCINEX DM MAXIMUM STRENGTH) 60-1200 MG per 12 hr tablet Take 2 tablets by mouth at bedtime as needed.        [name] escitalopram (LEXAPRO) 10 MG tablet take 1 tablet by mouth once daily  90 tablet  3  . finasteride (PROSCAR) 5 MG tablet Take 1 tablet (5 mg total) by mouth daily.  90 tablet  1  . glimepiride (AMARYL) 2 MG tablet Take 1 tablet (2 mg total) by mouth daily before breakfast.  30 tablet  5  . lisinopril (PRINIVIL,ZESTRIL) 10 MG tablet Take 1 tablet (10 mg total) by mouth daily.  90 tablet  1  . metFORMIN (GLUCOPHAGE) 500 MG tablet take 2 tablets by mouth twice a day with food  120 tablet  5  . mometasone (NASONEX) 50 MCG/ACT nasal spray Place two sprays in each nostril once daily  51 g  0  . Multiple Vitamin (MULTIVITAMIN) tablet Take 1 tablet by mouth daily.      . Naproxen Sodium (ALEVE) 220 MG CAPS Take 2  capsules by mouth at bedtime as needed.        . Omega-3 Fatty Acids (FISH OIL) 1000 MG CAPS Take 1 capsule by mouth 2 (two) times daily.      . pseudoephedrine (SUDAFED) 30 MG tablet Take 60 mg by mouth at bedtime as needed.        . sildenafil (VIAGRA) 50 MG tablet Take 50 mg by mouth daily as needed.        . Tamsulosin HCl (FLOMAX) 0.4 MG CAPS Take 2 capsules (0.8 mg total) by mouth at bedtime. MUST HAVE APPT FOR MORE REFILLS.  180 capsule  0  . vitamin B-12 (CYANOCOBALAMIN) 1000 MCG tablet Take 1,000 mcg by mouth daily.        . zaleplon (SONATA) 10 MG capsule Take 1 capsule (10 mg total) by mouth at bedtime as needed.  12 capsule  5   Last reviewed on 11/18/2012 11:01 AM by Jeoffrey Massed, MD  PE: Blood pressure 141/95, pulse 81, temperature 98 F (36.7 C), temperature source Temporal, height 5\' 10"  (1.778 m), weight 286 lb (129.729 kg). Gen: Alert, well appearing, obese white male in NAD.   Patient is oriented to person, place, time, and situation. No further exam today.   IMPRESSION AND PLAN:  Type II or unspecified type diabetes mellitus without mention of complication, uncontrolled Add Victoza; instructions given to start at 0.6mg  qd and increase to 1.2 mg qd after 7d. Therapeutic expectations and side effect profile of medication discussed today.  Patient's questions answered. Continue other current diabetic meds, diet, and increase exercise. Pneumovax given today.  He is UTD on his diabetic complication monitoring. He'll monitor fasting and one postprandial glucose daily for the next 2 wks and bring them in for review at o/v with me.  HTN (hypertension), benign Not ideal control. Needs to use his bp cuff at home for monitoring but he is a bit overwhelmed at the moment and we'll not push this issue right now. Will increase lisinopril to 20mg  qd.  Hyperlipidemia He is tolerating crestor 10mg . I recommended we increase to 20mg  qd dosing and I emphasized the need for him to take this med DAILY. We'll recheck FLP in a few months.  Groin strain Improving with PT.   An After Visit Summary was printed and given to the patient.  FOLLOW UP: 2 wks

## 2012-11-18 NOTE — Assessment & Plan Note (Signed)
Not ideal control. Needs to use his bp cuff at home for monitoring but he is a bit overwhelmed at the moment and we'll not push this issue right now. Will increase lisinopril to 20mg  qd.

## 2012-11-18 NOTE — Assessment & Plan Note (Signed)
He is tolerating crestor 10mg . I recommended we increase to 20mg  qd dosing and I emphasized the need for him to take this med DAILY. We'll recheck FLP in a few months.

## 2012-11-21 ENCOUNTER — Encounter: Payer: Self-pay | Admitting: Family Medicine

## 2012-11-21 ENCOUNTER — Other Ambulatory Visit: Payer: Self-pay

## 2012-11-21 MED ORDER — GLUCOSE BLOOD VI STRP: ORAL_STRIP | Status: DC

## 2012-11-21 NOTE — Telephone Encounter (Signed)
Needs to know how many times a day to check BS. Per [name] [name] its bid. Will resend [employer]

## 2012-12-02 ENCOUNTER — Ambulatory Visit: Payer: BC Managed Care – PPO | Admitting: Family Medicine

## 2012-12-16 ENCOUNTER — Encounter: Payer: Self-pay | Admitting: Family Medicine

## 2012-12-16 ENCOUNTER — Ambulatory Visit (INDEPENDENT_AMBULATORY_CARE_PROVIDER_SITE_OTHER): Payer: BC Managed Care – PPO | Admitting: Family Medicine

## 2012-12-16 ENCOUNTER — Other Ambulatory Visit: Payer: Self-pay | Admitting: Family Medicine

## 2012-12-16 VITALS — BP 114/82 | HR 94 | Ht 70.0 in | Wt 280.0 lb

## 2012-12-16 DIAGNOSIS — E782 Mixed hyperlipidemia: Secondary | ICD-10-CM

## 2012-12-16 DIAGNOSIS — IMO0001 Reserved for inherently not codable concepts without codable children: Secondary | ICD-10-CM

## 2012-12-16 DIAGNOSIS — I10 Essential (primary) hypertension: Secondary | ICD-10-CM

## 2012-12-16 NOTE — Progress Notes (Signed)
OFFICE NOTE  12/16/2012  CC:  Chief Complaint  Patient presents with  . Follow-up    DM, Victoza     HPI: Patient is a 62 y.o. Caucasian male who is here for 1 mo f/u DM 2.   Glucoses from 2 weeks recently show fastings avg 115 and ONE hour PP avg 140-150.   Has rare mild nausea and some heartburn in evenings/night but nothing really consistent or severe. Even BMs are better--no more loose ones. He has altered his eating habits pretty well: smaller meals, healthier foods. He is happy with results.  Pertinent PMH:  DM 2 HTN Hyperlipidemia  MEDS:  Outpatient Prescriptions Prior to Visit  Medication Sig Dispense Refill  . aspirin 325 MG tablet Take 325 mg by mouth daily.        . Choline Fenofibrate (TRILIPIX) 135 MG capsule Take 1 capsule (135 mg total) by mouth daily.  90 capsule  1  . escitalopram (LEXAPRO) 10 MG tablet take 1 tablet by mouth once daily  90 tablet  3  . finasteride (PROSCAR) 5 MG tablet Take 1 tablet (5 mg total) by mouth daily.  90 tablet  1  . glimepiride (AMARYL) 2 MG tablet Take 1 tablet (2 mg total) by mouth daily before breakfast.  30 tablet  5  . glucose blood (FREESTYLE LITE) test strip Check blood sugar bid  100 each  2  . Liraglutide (VICTOZA) 18 MG/3ML SOLN 0.6mg  SQ qd x 7d, then increase to 1.2 mg SQ qd  6 mL  0  . lisinopril (PRINIVIL,ZESTRIL) 20 MG tablet Take 1 tablet (20 mg total) by mouth daily.  30 tablet  1  . metFORMIN (GLUCOPHAGE) 500 MG tablet take 2 tablets by mouth twice a day with food  120 tablet  5  . Multiple Vitamin (MULTIVITAMIN) tablet Take 1 tablet by mouth daily.      . Naproxen Sodium (ALEVE) 220 MG CAPS Take 2 capsules by mouth at bedtime as needed.        . Omega-3 Fatty Acids (FISH OIL) 1000 MG CAPS Take 1 capsule by mouth 2 (two) times daily.      . pseudoephedrine (SUDAFED) 30 MG tablet Take 60 mg by mouth at bedtime as needed.        . rosuvastatin (CRESTOR) 20 MG tablet Take 1 tablet (20 mg total) by mouth daily.  90  tablet  3  . sildenafil (VIAGRA) 50 MG tablet Take 50 mg by mouth daily as needed.        . Tamsulosin HCl (FLOMAX) 0.4 MG CAPS Take 2 capsules (0.8 mg total) by mouth at bedtime. MUST HAVE APPT FOR MORE REFILLS.  180 capsule  0  . vitamin B-12 (CYANOCOBALAMIN) 1000 MCG tablet Take 1,000 mcg by mouth daily.        . zaleplon (SONATA) 10 MG capsule Take 1 capsule (10 mg total) by mouth at bedtime as needed.  12 capsule  5  . Dextromethorphan-Guaifenesin (MUCINEX DM MAXIMUM STRENGTH) 60-1200 MG per 12 hr tablet Take 2 tablets by mouth at bedtime as needed.        . [DISCONTINUED] mometasone (NASONEX) 50 MCG/ACT nasal spray Place two sprays in each nostril once daily  51 g  0   Last reviewed on 12/16/2012 10:39 AM by Jeoffrey Massed, MD  PE: Blood pressure 114/82, pulse 94, height 5\' 10"  (1.778 m), weight 280 lb (127.007 kg). Gen: Alert, well appearing.  Patient is oriented to person, place, time,  and situation. AFFECT: pleasant.  Displays lucid thought and speech. No further exam today.  IMPRESSION AND PLAN:  Type II or unspecified type diabetes mellitus without mention of complication, uncontrolled Responding nicely to recent addition of Victoza and he's tolerating the 1.2mg  dose well. We discussed use of zantac or pepcid prn GER. Reviewed goal glucose ranges, continue home monitoring qd-bid as much as possible. Continue metformin and glimeperide. F/u 2 mo, and we'll check his FLP, HbA1c, CMET, and urine microalb/cr the week prior.   FOLLOW UP: 2 mo

## 2012-12-16 NOTE — Assessment & Plan Note (Signed)
Responding nicely to recent addition of Victoza and he's tolerating the 1.2mg  dose well. We discussed use of zantac or pepcid prn GER. Reviewed goal glucose ranges, continue home monitoring qd-bid as much as possible. Continue metformin and glimeperide. F/u 2 mo, and we'll check his FLP, HbA1c, CMET, and urine microalb/cr the week prior.

## 2012-12-22 ENCOUNTER — Other Ambulatory Visit: Payer: Self-pay | Admitting: Family Medicine

## 2012-12-22 ENCOUNTER — Encounter: Payer: Self-pay | Admitting: *Deleted

## 2012-12-22 ENCOUNTER — Encounter: Payer: Self-pay | Admitting: Family Medicine

## 2012-12-22 MED ORDER — TAMSULOSIN HCL 0.4 MG PO CAPS: 0.8000 mg | ORAL_CAPSULE | Freq: Every day | ORAL | Status: DC

## 2012-12-22 MED ORDER — LIRAGLUTIDE 18 MG/3ML ~~LOC~~ SOLN: 1.2000 mg | Freq: Every day | SUBCUTANEOUS | Status: DC

## 2012-12-22 NOTE — Telephone Encounter (Signed)
Refill request for TAMSULOSIN  Refill request for VICTOZA Last seen- 12/16/12 Refill sent per [facility] refill protocol.

## 2012-12-23 ENCOUNTER — Telehealth: Payer: Self-pay | Admitting: *Deleted

## 2012-12-23 NOTE — Telephone Encounter (Signed)
Refills completed in telephone encounter on 12/22/12. 

## 2012-12-23 NOTE — Telephone Encounter (Signed)
Prior auth process started.  PC to pt to offer sample if he will run out prior to Monday.  Pt will come pick up today. PC to [facility].. Unable to hold after 12 minutes.  Hung up with out speaking to customer service.

## 2012-12-23 NOTE — Telephone Encounter (Signed)
Refills completed in telephone encounter on 12/22/12.

## 2012-12-28 NOTE — Telephone Encounter (Signed)
PA approved by [facility] per [place].  Pharmacy notified via fax.  Pt notified via phone.

## 2013-01-11 ENCOUNTER — Encounter: Payer: Self-pay | Admitting: Family Medicine

## 2013-01-11 MED ORDER — GLUCOSE BLOOD VI STRP: ORAL_STRIP | Status: DC

## 2013-01-11 NOTE — Telephone Encounter (Signed)
RX sent

## 2013-01-25 ENCOUNTER — Telehealth: Payer: Self-pay | Admitting: Family Medicine

## 2013-01-25 NOTE — Telephone Encounter (Signed)
Pls call pt and notify him that I got a letter from his insurer saying they won't cover Victoza, and I need to use one of their preferred meds in place of it. Tell him this med will be byetta--an injection med that works like victoza but the injection is done twice a day OR there is also a once weekly injection of Byetta  that his insurer covers.  Pls see how much victoza he has left so I can know when to make this change-over.--thx

## 2013-01-26 ENCOUNTER — Other Ambulatory Visit: Payer: Self-pay | Admitting: Family Medicine

## 2013-01-26 MED ORDER — EXENATIDE ER 2 MG ~~LOC~~ SUSR: 2.0000 mg | SUBCUTANEOUS | Status: DC

## 2013-01-26 NOTE — Telephone Encounter (Signed)
Pt states he has one full pen left of Victoza plus a couple of doses in current pen.  He would like to do the once weekly injection.  He is agreeable with change.

## 2013-01-26 NOTE — Telephone Encounter (Signed)
Ok, will order bydureon to start the day after he finishes his last victoza. He needs to take it once every 7 days.  May induce mild nausea like the victoza did.  Continue to monitor glucoses as he's been doing.-thx

## 2013-01-27 NOTE — Telephone Encounter (Signed)
Pt notified.  He is agreeable.  He will come pick up sample on Monday.

## 2013-01-30 ENCOUNTER — Other Ambulatory Visit: Payer: Self-pay | Admitting: Family Medicine

## 2013-01-30 NOTE — Telephone Encounter (Signed)
eScribe request for refill on LISINOPRIL Last filled - 11/18/12, #30 X 1 Last seen on - 12/16/12 Follow up - 02/13/13 RX sent per protocol.

## 2013-01-31 ENCOUNTER — Other Ambulatory Visit: Payer: Self-pay | Admitting: Family Medicine

## 2013-01-31 NOTE — Telephone Encounter (Signed)
;  eScribe request for refill on TRILIPIX Last filled - 04/18/12, #90 X 1 Last seen on - 12/16/12  Follow up - 02/13/13  RX sent per protocol.

## 2013-02-06 ENCOUNTER — Other Ambulatory Visit (INDEPENDENT_AMBULATORY_CARE_PROVIDER_SITE_OTHER): Payer: BC Managed Care – PPO

## 2013-02-06 DIAGNOSIS — IMO0001 Reserved for inherently not codable concepts without codable children: Secondary | ICD-10-CM

## 2013-02-06 DIAGNOSIS — I10 Essential (primary) hypertension: Secondary | ICD-10-CM

## 2013-02-06 DIAGNOSIS — E782 Mixed hyperlipidemia: Secondary | ICD-10-CM

## 2013-02-06 LAB — HEMOGLOBIN A1C: Hgb A1c MFr Bld: 6.5 % (ref 4.6–6.5)

## 2013-02-06 NOTE — Progress Notes (Signed)
Labs only

## 2013-02-07 LAB — MICROALBUMIN / CREATININE URINE RATIO
Creatinine,U: 163.7 mg/dL
Microalb Creat Ratio: 0.1 mg/g (ref 0.0–30.0)
Microalb, Ur: 0.2 mg/dL (ref 0.0–1.9)

## 2013-02-07 LAB — COMPREHENSIVE METABOLIC PANEL
ALT: 23 U/L (ref 0–53)
AST: 17 U/L (ref 0–37)
Albumin: 4 g/dL (ref 3.5–5.2)
Alkaline Phosphatase: 29 U/L — ABNORMAL LOW (ref 39–117)
BUN: 18 mg/dL (ref 6–23)
CO2: 24 mEq/L (ref 19–32)
Calcium: 9.2 mg/dL (ref 8.4–10.5)
Chloride: 106 mEq/L (ref 96–112)
Creatinine, Ser: 1 mg/dL (ref 0.4–1.5)
GFR: 81.92 mL/min (ref >60.00)
Glucose, Bld: 112 mg/dL — ABNORMAL HIGH (ref 70–99)
Potassium: 4 mEq/L (ref 3.5–5.1)
Sodium: 139 mEq/L (ref 135–145)
Total Bilirubin: 0.7 mg/dL (ref 0.3–1.2)
Total Protein: 6.4 g/dL (ref 6.0–8.3)

## 2013-02-07 LAB — LIPID PANEL
Cholesterol: 151 mg/dL (ref 0–200)
HDL: 40.2 mg/dL (ref >39.00)
LDL Cholesterol: 74 mg/dL (ref 0–99)
Total CHOL/HDL Ratio: 4
Triglycerides: 185 mg/dL — ABNORMAL HIGH (ref 0.0–149.0)
VLDL: 37 mg/dL (ref 0.0–40.0)

## 2013-02-13 ENCOUNTER — Encounter: Payer: Self-pay | Admitting: Family Medicine

## 2013-02-13 ENCOUNTER — Ambulatory Visit (INDEPENDENT_AMBULATORY_CARE_PROVIDER_SITE_OTHER): Payer: BC Managed Care – PPO | Admitting: Family Medicine

## 2013-02-13 VITALS — BP 116/68 | HR 88 | Temp 97.9°F | Ht 70.0 in | Wt 279.0 lb

## 2013-02-13 DIAGNOSIS — I10 Essential (primary) hypertension: Secondary | ICD-10-CM

## 2013-02-13 DIAGNOSIS — E119 Type 2 diabetes mellitus without complications: Secondary | ICD-10-CM

## 2013-02-13 DIAGNOSIS — Z0000 Encounter for general adult medical examination without abnormal findings: Secondary | ICD-10-CM

## 2013-02-13 DIAGNOSIS — E785 Hyperlipidemia, unspecified: Secondary | ICD-10-CM

## 2013-02-13 DIAGNOSIS — Z86010 Personal history of colonic polyps: Secondary | ICD-10-CM

## 2013-02-13 DIAGNOSIS — Z860101 Personal history of adenomatous and serrated colon polyps: Secondary | ICD-10-CM

## 2013-02-13 MED ORDER — ZOSTER VACCINE LIVE 19400 UNT/0.65ML ~~LOC~~ SOLR: 0.6500 mL | Freq: Once | SUBCUTANEOUS | Status: DC

## 2013-02-13 NOTE — Assessment & Plan Note (Signed)
Problem stable.  Continue current medications and diet appropriate for this condition.  We have reviewed our general long term plan for this problem and also reviewed symptoms and signs that should prompt the patient to call or return to the office.  

## 2013-02-13 NOTE — Assessment & Plan Note (Signed)
Much improved on GLP agonist. Switch to bydureon today from victoza [employer]). Hold amaryl for now to avoid hypoglycemia.

## 2013-02-13 NOTE — Assessment & Plan Note (Signed)
Pt continues to put off GI referral for f/u colonoscopy that he is overdue for. He'll pursue this after he gets some dental work done. We'll readdress this periodically.

## 2013-02-13 NOTE — Progress Notes (Signed)
OFFICE NOTE  02/13/2013  CC:  Chief Complaint  Patient presents with  . Follow-up    DM     HPI: Patient is a 62 y.o. Caucasian male who is here for 2 mo f/u DM2, HTn, Hyperlipidemia. Reviewed recent labs today in detail--all excellent. He finishes victoza today and has to start bydureon tomorrow due to insurance formulary restrictions. He wants shingles vaccine.  GLucoses: Home gluc's 119-140.  Has new glucometer now due to insurance requirement. Some nausea once in a while still and takes Gas-ex, some GER and takes prn prilosec and this helps.  Stool continues to be compact, harder to expel but not painful.  He seems to correlate this with taking victoza.  Has a tooth that is in need of pulling and needs implant, says he wants to put surveillance colonoscopy off until this is done.  He is compliant with bp meds, no adverse effects.  No home bps to report.  Pertinent PMH:  Past Medical History  Diagnosis Date  . History of scarlet fever     age 29  . 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 Keratoconus     dx'd 1973  . Depression     Hospitalized for suicidal ideation 77.  Has been on lexapro since 10/2008.  [name] Hyperlipidemia, mixed 1993  . Hypertension 2009    "marked chronic cardiomegaly" on CXR 01/2009 per old records.  . Diabetes mellitus type II     Dx'd approximately 06/2008  . OSA (obstructive sleep apnea) 2004    intolerant of CPAP  . History of pneumonia 2009    Hospitalized  . DDD (degenerative disc disease), lumbar 2010    laminectomy 01/2009  . BPH (benign prostatic hypertrophy)   . Insomnia   . Rhinitis, chronic   . Erectile dysfunction   . 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 History of colon polyps     MEDS:  Outpatient Prescriptions Prior to Visit  Medication Sig Dispense Refill  . aspirin 325 MG tablet Take 325 mg by mouth daily.        . Choline Fenofibrate (FENOFIBRIC  ACID) 135 MG CPDR take 1 capsule by mouth once daily  90 capsule  1  . Dextromethorphan-Guaifenesin (MUCINEX DM MAXIMUM STRENGTH) 60-1200 MG per 12 hr tablet Take 2 tablets by mouth at bedtime as needed.        [name] escitalopram (LEXAPRO) 10 MG tablet take 1 tablet by mouth once daily  90 tablet  3  . Exenatide (BYDUREON) 2 MG SUSR Inject 2 mg into the skin once a week.  4 each  12  . finasteride (PROSCAR) 5 MG tablet Take 1 tablet (5 mg total) by mouth daily.  90 tablet  1  . glimepiride (AMARYL) 2 MG tablet Take 1 tablet (2 mg total) by mouth daily before breakfast.  30 tablet  5  . glucose blood (ONE TOUCH ULTRA TEST) test strip Use as instructed.  DX 250.00  100 each  12  . lisinopril (PRINIVIL,ZESTRIL) 20 MG tablet take 1 tablet by mouth once daily  30 tablet  5  . metFORMIN (GLUCOPHAGE) 500 MG tablet take 2 tablets by mouth twice a day with food  120 tablet  5  . Naproxen Sodium (ALEVE) 220 MG CAPS Take 2 capsules by mouth at bedtime as needed.        . Omega-3 Fatty  Acids (FISH OIL) 1000 MG CAPS Take 1 capsule by mouth 2 (two) times daily.      . pseudoephedrine (SUDAFED) 30 MG tablet Take 60 mg by mouth at bedtime as needed.        . rosuvastatin (CRESTOR) 20 MG tablet Take 1 tablet (20 mg total) by mouth daily.  90 tablet  3  . sildenafil (VIAGRA) 50 MG tablet Take 50 mg by mouth daily as needed.        . Tamsulosin HCl (FLOMAX) 0.4 MG CAPS Take 2 capsules (0.8 mg total) by mouth at bedtime.  180 capsule  0  . vitamin B-12 (CYANOCOBALAMIN) 1000 MCG tablet Take 1,000 mcg by mouth daily.        . zaleplon (SONATA) 10 MG capsule Take 1 capsule (10 mg total) by mouth at bedtime as needed.  12 capsule  5  . Multiple Vitamin (MULTIVITAMIN) tablet Take 1 tablet by mouth daily.       No facility-administered medications prior to visit.    PE: Blood pressure 116/68, pulse 88, temperature 97.9 F (36.6 C), temperature source Temporal, height 5\' 10"  (1.778 m), weight 279 lb (126.554 kg). Gen:  Alert, well appearing, obese-appearing.  Patient is oriented to person, place, time, and situation. AFFECT: pleasant, lucid thought and speech. No further exam today.  LABS:    Lab Results  Component Value Date   TSH 1.44 07/09/2011   Lab Results  Component Value Date   WBC 7.1 04/13/2012   HGB 13.3 04/13/2012   HCT 40.1 04/13/2012   MCV 89.7 04/13/2012   PLT 220.0 04/13/2012   Lab Results  Component Value Date   CREATININE 1.0 02/06/2013   BUN 18 02/06/2013   NA 139 02/06/2013   K 4.0 02/06/2013   CL 106 02/06/2013   CO2 24 02/06/2013   Lab Results  Component Value Date   ALT 23 02/06/2013   AST 17 02/06/2013   ALKPHOS 29* 02/06/2013   BILITOT 0.7 02/06/2013   Lab Results  Component Value Date   CHOL 151 02/06/2013   Lab Results  Component Value Date   HDL 40.20 02/06/2013   Lab Results  Component Value Date   LDLCALC 74 02/06/2013   Lab Results  Component Value Date   TRIG 185.0* 02/06/2013   Lab Results  Component Value Date   CHOLHDL 4 02/06/2013   Lab Results  Component Value Date   PSA 0.12 07/09/2011    IMPRESSION AND PLAN:  Type II or unspecified type diabetes mellitus without mention of complication, not stated as uncontrolled Much improved on GLP agonist. Switch to bydureon today from victoza [employer]). Hold amaryl for now to avoid hypoglycemia.  Hyperlipidemia Doing well on current meds/dosing. Recheck these in 6 mo.  HTN (hypertension), benign Problem stable.  Continue current medications and diet appropriate for this condition.  We have reviewed our general long term plan for this problem and also reviewed symptoms and signs that should prompt the patient to call or return to the office.   Hx of adenomatous colonic polyps Pt continues to put off GI referral for f/u colonoscopy that he is overdue for. He'll pursue this after he gets some dental work done. We'll readdress this periodically.   CMA Francee Piccolo reviewed bydureon  administration instructions with pt and he'll look on their web site as well for demonstration of how to administer this.    An After Visit Summary was printed and given to the patient.  FOLLOW UP: 16mo for CPE, fasting labs the week prior.

## 2013-02-13 NOTE — Patient Instructions (Addendum)
HOLD glimeperide (amaryl) until further notice/discussion with me !

## 2013-02-13 NOTE — Assessment & Plan Note (Signed)
Doing well on current meds/dosing. Recheck these in 6 mo.

## 2013-02-16 ENCOUNTER — Other Ambulatory Visit: Payer: Self-pay | Admitting: Family Medicine

## 2013-02-16 NOTE — Telephone Encounter (Signed)
Rx request to pharmacy/SLS  

## 2013-03-30 ENCOUNTER — Encounter: Payer: Self-pay | Admitting: Family Medicine

## 2013-03-30 ENCOUNTER — Other Ambulatory Visit: Payer: Self-pay | Admitting: Family Medicine

## 2013-03-30 MED ORDER — ZALEPLON 10 MG PO CAPS: 10.0000 mg | ORAL_CAPSULE | Freq: Every evening | ORAL | Status: DC | PRN

## 2013-03-30 NOTE — Telephone Encounter (Signed)
Rx faxed to pharmacy/SLS 

## 2013-03-30 NOTE — Telephone Encounter (Signed)
Pt requesting new Rx for Sonata 10 mg [name] [name] Rx 01.24.2013 #[id number]]/SLS "Would it be possible for Dr. Milinda Cave to write me a new prescription for Zaleplon (spelling?)? I see it has expired, and I do need it occasionally for sleep. Thank you" Please advise.

## 2013-05-10 ENCOUNTER — Other Ambulatory Visit (INDEPENDENT_AMBULATORY_CARE_PROVIDER_SITE_OTHER): Payer: BC Managed Care – PPO

## 2013-05-10 DIAGNOSIS — Z0000 Encounter for general adult medical examination without abnormal findings: Secondary | ICD-10-CM

## 2013-05-10 LAB — COMPREHENSIVE METABOLIC PANEL
ALT: 23 U/L (ref 0–53)
AST: 16 U/L (ref 0–37)
Albumin: 4 g/dL (ref 3.5–5.2)
Alkaline Phosphatase: 28 U/L — ABNORMAL LOW (ref 39–117)
BUN: 17 mg/dL (ref 6–23)
CO2: 19 mEq/L (ref 19–32)
Calcium: 10.2 mg/dL (ref 8.4–10.5)
Chloride: 104 mEq/L (ref 96–112)
Creatinine, Ser: 1.1 mg/dL (ref 0.4–1.5)
GFR: 76.47 mL/min (ref >60.00)
Glucose, Bld: 134 mg/dL — ABNORMAL HIGH (ref 70–99)
Potassium: 4 mEq/L (ref 3.5–5.1)
Sodium: 140 mEq/L (ref 135–145)
Total Bilirubin: 0.6 mg/dL (ref 0.3–1.2)
Total Protein: 6.4 g/dL (ref 6.0–8.3)

## 2013-05-10 LAB — CBC WITH DIFFERENTIAL/PLATELET
Basophils Absolute: 0 10*3/uL (ref 0.0–0.1)
Basophils Relative: 0.5 % (ref 0.0–3.0)
Eosinophils Absolute: 0.2 10*3/uL (ref 0.0–0.7)
Eosinophils Relative: 2 % (ref 0.0–5.0)
HCT: 40.5 % (ref 39.0–52.0)
Hemoglobin: 13.6 g/dL (ref 13.0–17.0)
Lymphocytes Relative: 41.9 % (ref 12.0–46.0)
Lymphs Abs: 3.4 10*3/uL (ref 0.7–4.0)
MCHC: 33.5 g/dL (ref 30.0–36.0)
MCV: 88.4 fl (ref 78.0–100.0)
Monocytes Absolute: 0.5 10*3/uL (ref 0.1–1.0)
Monocytes Relative: 6 % (ref 3.0–12.0)
Neutro Abs: 4 10*3/uL (ref 1.4–7.7)
Neutrophils Relative %: 49.6 % (ref 43.0–77.0)
Platelets: 241 10*3/uL (ref 150.0–400.0)
RBC: 4.59 Mil/uL (ref 4.22–5.81)
RDW: 13.5 % (ref 11.5–14.6)
WBC: 8.1 10*3/uL (ref 4.5–10.5)

## 2013-05-10 LAB — TSH: TSH: 2.5 u[IU]/mL (ref 0.35–5.50)

## 2013-05-10 LAB — HEMOGLOBIN A1C: Hgb A1c MFr Bld: 6.7 % — ABNORMAL HIGH (ref 4.6–6.5)

## 2013-05-10 LAB — PSA: PSA: 0.03 ng/mL — ABNORMAL LOW (ref 0.10–4.00)

## 2013-05-11 NOTE — Progress Notes (Signed)
Labs only

## 2013-05-17 ENCOUNTER — Other Ambulatory Visit: Payer: Self-pay | Admitting: Family Medicine

## 2013-05-17 ENCOUNTER — Encounter: Payer: Self-pay | Admitting: Family Medicine

## 2013-05-17 ENCOUNTER — Ambulatory Visit (INDEPENDENT_AMBULATORY_CARE_PROVIDER_SITE_OTHER): Payer: BC Managed Care – PPO | Admitting: Family Medicine

## 2013-05-17 VITALS — BP 123/79 | HR 92 | Temp 98.1°F | Resp 18 | Ht 70.0 in | Wt 272.0 lb

## 2013-05-17 DIAGNOSIS — I10 Essential (primary) hypertension: Secondary | ICD-10-CM

## 2013-05-17 DIAGNOSIS — E782 Mixed hyperlipidemia: Secondary | ICD-10-CM

## 2013-05-17 DIAGNOSIS — Z0000 Encounter for general adult medical examination without abnormal findings: Secondary | ICD-10-CM

## 2013-05-17 DIAGNOSIS — R42 Dizziness and giddiness: Secondary | ICD-10-CM

## 2013-05-17 DIAGNOSIS — E119 Type 2 diabetes mellitus without complications: Secondary | ICD-10-CM

## 2013-05-17 MED ORDER — ROSUVASTATIN CALCIUM 40 MG PO TABS: 40.0000 mg | ORAL_TABLET | Freq: Every day | ORAL | Status: DC

## 2013-05-17 NOTE — Assessment & Plan Note (Signed)
Discussed dx, pathophys behind it + possible contribution from his DM (autonomic neuropathy). These are fortunately mild sx's, brief, and relatively infrequent.  Discussed behav mod to avoid sx's/falls.  Stressed importance of maintaning normal hydration.

## 2013-05-17 NOTE — Assessment & Plan Note (Addendum)
Reviewed age and gender appropriate health maintenance issues (prudent diet, regular exercise, health risks of tobacco and excessive alcohol, use of seatbelts, fire alarms in home, use of sunscreen).  Also reviewed age and gender appropriate health screening as well as vaccine recommendations. He will defer his colonoscopy (f/u adenomatous polyps) again since he still has to get some dental work done first. DRE today normal. PSA normal. All labs recently good but chol not done.  Diabetic foot exam today normal.  He is due for his annual diab ret eye screening. Last LDL in March this year was still not at goal so will push crestor dosing to max of 40mg  qd. He'll try to get more strict with diet again.

## 2013-05-17 NOTE — Progress Notes (Signed)
Office Note 05/17/2013  CC:  Chief Complaint  Patient presents with  . Annual Exam  . Dizziness    HPI:  [name] [name] is a 62 y.o. White male who is here for CPE, also wants to discuss dizziness. We reviewed all of his recent labs in detail during today's visit.  Pt says he has been having 2-3 episodes per week of feeling lightheaded/disequilibrium feeling, briefly vision gets a bit blurred, he feels mildly nauseated, episodes last total of about 10 sec.  No falls or syncope.  These episodes seem to be triggered mostly by going from sitting to standing position, esp after having been seated for quite a while.  No headaches, no ringing in ears, no hearing loss, no vertiginous sensation. CBGs lately 125-160 range, admits he has been "a bit lax" on his diet lately.   No new OTC or herbal meds.  He does not associate any of his old/chronic meds with these episodes.   Past Medical History  Diagnosis Date  . History of scarlet fever     age 89  . 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 Keratoconus     dx'd 1973  . Depression     Hospitalized for suicidal ideation 14.  Has been on lexapro since 10/2008.  [name] Hyperlipidemia, mixed 1993  . Hypertension 2009    "marked chronic cardiomegaly" on CXR 01/2009 per old records.  . Diabetes mellitus type II     Dx'd approximately 06/2008  . OSA (obstructive sleep apnea) 2004    intolerant of CPAP  . History of pneumonia 2009    Hospitalized  . DDD (degenerative disc disease), lumbar 2010    laminectomy 01/2009  . BPH (benign prostatic hypertrophy)   . Insomnia   . Rhinitis, chronic   . Erectile dysfunction   . 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 History of colon polyps     Past Surgical History  Procedure Laterality Date  . Appendectomy  1972  . Vasectomy  1994  . 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 Tonsillectomy and adenoidectomy      age 80  .  Urethral dilation      2 as a child, one as an adult  . Colonoscopy  X 3    Most recent was 2005, with removal of polyps in the first two.  [name] Electrocardiogram  01/29/2009    NORMAL  . Lumbar laminectomy  01/2009    Family History  Problem Relation Age of Onset  . Heart disease Mother   . Hyperlipidemia Mother   . Stroke Mother   . Diabetes Mother   . Depression Mother   . Alzheimer's disease Mother   . Stroke Father   . Hyperlipidemia Father   . Heart disease Father   . Diabetes Father   . Alcohol abuse Sister   . Depression Sister     History   Social History  . Marital Status: Married    Spouse Name: N/A    Number of Children: N/A  . Years of Education: N/A   Occupational History  . Not on file.   Social History Main Topics  . Smoking status: Never Smoker   . Smokeless tobacco: Never Used  . Alcohol Use: Yes     Comment: social, 3-4 drinks month  . Drug Use: No  . Sexually Active: Not on  file   Other Topics Concern  . Not on file   Social History Narrative   Divorced, then remarried.  Has 3 sons, no grandchildren.   Retired Museum/gallery exhibitions officer from [place], relocated to [facility] [place] 2012 when his wife got a job with [employer].   No tobacco.  Rare ETOH.  No drug abuse.   Enjoys reading and spending time with his 2 dogs.    Outpatient Prescriptions Prior to Visit  Medication Sig Dispense Refill  . aspirin 325 MG tablet Take 325 mg by mouth daily.        . Choline Fenofibrate (FENOFIBRIC ACID) 135 MG CPDR take 1 capsule by mouth once daily  90 capsule  1  . Dextromethorphan-Guaifenesin (MUCINEX DM MAXIMUM STRENGTH) 60-1200 MG per 12 hr tablet Take 2 tablets by mouth at bedtime as needed.        [name] escitalopram (LEXAPRO) 10 MG tablet take 1 tablet by mouth once daily  90 tablet  3  . Exenatide (BYDUREON) 2 MG SUSR Inject 2 mg into the skin once a week.  4 each  12  . finasteride (PROSCAR) 5 MG tablet take 1 tablet by mouth once daily  90 tablet  1  . glucose blood  (ONE TOUCH ULTRA TEST) test strip Use as instructed.  DX 250.00  100 each  12  . lisinopril (PRINIVIL,ZESTRIL) 20 MG tablet take 1 tablet by mouth once daily  30 tablet  5  . metFORMIN (GLUCOPHAGE) 500 MG tablet take 2 tablets by mouth twice a day with food  120 tablet  5  . Naproxen Sodium (ALEVE) 220 MG CAPS Take 2 capsules by mouth at bedtime as needed.        . Omega-3 Fatty Acids (FISH OIL) 1000 MG CAPS Take 1 capsule by mouth 2 (two) times daily.      . sildenafil (VIAGRA) 50 MG tablet Take 50 mg by mouth daily as needed.        . Tamsulosin HCl (FLOMAX) 0.4 MG CAPS Take 2 capsules (0.8 mg total) by mouth at bedtime.  180 capsule  0  . vitamin B-12 (CYANOCOBALAMIN) 1000 MCG tablet Take 1,000 mcg by mouth daily.        . zaleplon (SONATA) 10 MG capsule Take 1 capsule (10 mg total) by mouth at bedtime as needed.  12 capsule  5  . zoster vaccine live, PF, (ZOSTAVAX) 16109 UNT/0.65ML injection Inject 19,400 Units into the skin once.  1 vial  0  . rosuvastatin (CRESTOR) 20 MG tablet Take 1 tablet (20 mg total) by mouth daily.  90 tablet  3  . pseudoephedrine (SUDAFED) 30 MG tablet Take 60 mg by mouth at bedtime as needed.        [name] glimepiride (AMARYL) 2 MG tablet Take 1 tablet (2 mg total) by mouth daily before breakfast.  30 tablet  5   No facility-administered medications prior to visit.    Allergies  Allergen Reactions  . Simvastatin Other (See Comments)    myalgias  . Antihistamines, Diphenhydramine-Type Other (See Comments)    depression  . Codeine Nausea Only  . Penicillins Hives  . Sulfa Antibiotics Hives    ROS Review of Systems  Constitutional: Negative for fever, chills, appetite change and fatigue.  HENT: Negative for ear pain, congestion, sore throat, neck stiffness and dental problem.   Eyes: Negative for discharge, redness and visual disturbance.  Respiratory: Negative for cough, chest tightness, shortness of breath and wheezing.   Cardiovascular: Negative  for chest  pain, palpitations and leg swelling.  Gastrointestinal: Negative for nausea, vomiting, abdominal pain, diarrhea and blood in stool.  Endocrine: Negative for cold intolerance, heat intolerance, polydipsia, polyphagia and polyuria.  Genitourinary: Negative for dysuria, urgency, frequency, hematuria, flank pain and difficulty urinating.  Musculoskeletal: Negative for myalgias, back pain, joint swelling and arthralgias.  Skin: Negative for pallor and rash.  Allergic/Immunologic: Negative for immunocompromised state.  Neurological: Positive for dizziness (see HPI). Negative for tremors, seizures, speech difficulty, weakness and headaches.  Hematological: Negative for adenopathy. Does not bruise/bleed easily.  Psychiatric/Behavioral: Negative for confusion, sleep disturbance and dysphoric mood. The patient is not nervous/anxious.     PE; Blood pressure 123/79, pulse 92, temperature 98.1 F (36.7 C), temperature source Oral, resp. rate 18, height 5\' 10"  (1.778 m), weight 272 lb (123.378 kg), SpO2 95.00%. Gen: Alert, well appearing.  Patient is oriented to person, place, time, and situation. AFFECT: pleasant, lucid thought and speech. ENT: Ears: EACs clear, normal epithelium.  TMs with good light reflex and landmarks bilaterally.  Eyes: no injection, icteris, swelling, or exudate.  EOMI, PERRLA. Nose: no drainage or turbinate edema/swelling.  No injection or focal lesion.  Mouth: lips without lesion/swelling.  Oral mucosa pink and moist.  Dentition intact and without obvious caries or gingival swelling.  Oropharynx without erythema, exudate, or swelling.  Neck: supple/nontender.  No LAD, mass, or TM.  Carotid pulses 2+ bilaterally, without bruits. CV: RRR, 1/6 SEM best heard at lower sternal border, no m/r/g.   LUNGS: CTA bilat, nonlabored resps, good aeration in all lung fields. ABD: soft, NT, ND, BS normal.  No hepatospenomegaly or mass.  No bruits. EXT: no clubbing, cyanosis.   1+ pitting edema  bilat. Musculoskeletal: no joint swelling, erythema, warmth, or tenderness.  ROM of all joints intact. Skin - no sores or suspicious lesions or rashes or color changes Foot exam - both normal; no swelling, tenderness or skin or vascular lesions. Color and temperature is normal. Sensation is intact. Peripheral pulses are palpable. Toenails are normal. Genitals normal; both testes normal without tenderness, masses, hydroceles, varicoceles, erythema or swelling. Shaft normal, circumcised, meatus normal without discharge. No inguinal hernia noted. No inguinal lymphadenopathy. Rectal exam: negative without mass, lesions or tenderness, PROSTATE EXAM: smooth and symmetric without nodules or tenderness. Neuro: CN 2-12 intact bilaterally, strength 5/5 in proximal and distal upper extremities and lower extremities bilaterally.  No sensory deficits.  No tremor.  No disdiadochokinesis.  No ataxia.  Upper extremity and lower extremity DTRs symmetric.  No pronator drift.    Pertinent labs:  Lab Results  Component Value Date   TSH 2.50 05/10/2013   Lab Results  Component Value Date   WBC 8.1 05/10/2013   HGB 13.6 05/10/2013   HCT 40.5 05/10/2013   MCV 88.4 05/10/2013   PLT 241.0 05/10/2013   Lab Results  Component Value Date   CREATININE 1.1 05/10/2013   BUN 17 05/10/2013   NA 140 05/10/2013   K 4.0 05/10/2013   CL 104 05/10/2013   CO2 19 05/10/2013   Lab Results  Component Value Date   ALT 23 05/10/2013   AST 16 05/10/2013   ALKPHOS 28* 05/10/2013   BILITOT 0.6 05/10/2013   Lab Results  Component Value Date   CHOL 151 02/06/2013   Lab Results  Component Value Date   HDL 40.20 02/06/2013   Lab Results  Component Value Date   LDLCALC 74 02/06/2013   Lab Results  Component Value Date  TRIG 185.0* 02/06/2013   Lab Results  Component Value Date   CHOLHDL 4 02/06/2013   Lab Results  Component Value Date   PSA 0.03* 05/10/2013   PSA 0.12 07/09/2011       ASSESSMENT AND PLAN:   Orthostatic  lightheadedness Discussed dx, pathophys behind it + possible contribution from his DM (autonomic neuropathy). These are fortunately mild sx's, brief, and relatively infrequent.  Discussed behav mod to avoid sx's/falls.  Stressed importance of maintaning normal hydration.  Health maintenance examination Reviewed age and gender appropriate health maintenance issues (prudent diet, regular exercise, health risks of tobacco and excessive alcohol, use of seatbelts, fire alarms in home, use of sunscreen).  Also reviewed age and gender appropriate health screening as well as vaccine recommendations. He will defer his colonoscopy (f/u adenomatous polyps) again since he still has to get some dental work done first. DRE today normal. PSA normal. All labs recently good but chol not done.  Diabetic foot exam today normal.  He is due for his annual diab ret eye screening. Last LDL in March this year was still not at goal so will push crestor dosing to max of 40mg  qd. He'll try to get more strict with diet again.  An After Visit Summary was printed and given to the patient.  FOLLOW UP:  Return in about 6 months (around 11/16/2013) for f/u DM and hyperlip and htN .

## 2013-05-20 ENCOUNTER — Encounter: Payer: Self-pay | Admitting: Family Medicine

## 2013-06-16 ENCOUNTER — Other Ambulatory Visit: Payer: Self-pay | Admitting: Family Medicine

## 2013-06-16 MED ORDER — TAMSULOSIN HCL 0.4 MG PO CAPS: 0.8000 mg | ORAL_CAPSULE | Freq: Every day | ORAL | Status: DC

## 2013-06-16 NOTE — Telephone Encounter (Signed)
Patient states that he should be taking two qhs not one. Per last office visit, it looks like he is supposed to take two QHS but the med list has one QHS. Please advise correct dose.  Thanks!

## 2013-06-21 ENCOUNTER — Encounter: Payer: Self-pay | Admitting: Family Medicine

## 2013-06-21 ENCOUNTER — Ambulatory Visit (INDEPENDENT_AMBULATORY_CARE_PROVIDER_SITE_OTHER): Payer: BC Managed Care – PPO | Admitting: Family Medicine

## 2013-06-21 VITALS — BP 134/89 | HR 88 | Temp 98.4°F | Resp 18 | Ht 70.0 in | Wt 273.0 lb

## 2013-06-21 DIAGNOSIS — K0889 Other specified disorders of teeth and supporting structures: Secondary | ICD-10-CM | POA: Insufficient documentation

## 2013-06-21 DIAGNOSIS — K089 Disorder of teeth and supporting structures, unspecified: Secondary | ICD-10-CM

## 2013-06-21 DIAGNOSIS — J029 Acute pharyngitis, unspecified: Secondary | ICD-10-CM

## 2013-06-21 DIAGNOSIS — J069 Acute upper respiratory infection, unspecified: Secondary | ICD-10-CM | POA: Insufficient documentation

## 2013-06-21 LAB — POCT RAPID STREP A (OFFICE): Rapid Strep A Screen: NEGATIVE

## 2013-06-21 MED ORDER — HYDROCODONE-ACETAMINOPHEN 5-325 MG PO TABS: ORAL_TABLET | ORAL | Status: DC

## 2013-06-21 NOTE — Assessment & Plan Note (Signed)
Self-limited nature of this illness was discussed, questions answered.  Discussed symptomatic care, rest, fluids.   Warning signs/symptoms of worsening illness were discussed.  Patient instructed to call or return if any of these occur.

## 2013-06-21 NOTE — Assessment & Plan Note (Addendum)
I don't think he has a dental infection/abscess. Vicodin 5/325, 1-2 q6h prn, #30, no RF. Keep oral surgeon appt coming up this Monday (5d). May continue alleve with food as well.

## 2013-06-21 NOTE — Progress Notes (Signed)
OFFICE NOTE  06/21/2013  CC:  Chief Complaint  Patient presents with  . Dental Problem    pt thinks he may need antibiotics.      HPI: Patient is a 62 y.o. Caucasian male who is here for 3-4 d runny nose, ST, dull ear aches, occ cough.  No SOB or wheezing. No f/c.  Some perinasal sinus pressure.  No signif nasal congestion.   A left lower molar tooth is broken and hurting and he clenches jaw lately with this.  No jaw swelling. He has appt with oral maxillofacial surgeon (Dr. Egbert Garibaldi) in 5d to further address this.  ROS: nocturia is improved over the last 1 mo so he feels better rested.  Pertinent PMH:  Past Medical History  Diagnosis Date  . History of scarlet fever     age 73  . 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 Keratoconus     dx'd 1973  . Depression     Hospitalized for suicidal ideation 68.  Has been on lexapro since 10/2008.  [name] Hyperlipidemia, mixed 1993  . Hypertension 2009    "marked chronic cardiomegaly" on CXR 01/2009 per old records.  . Diabetes mellitus type II     Dx'd approximately 06/2008  . OSA (obstructive sleep apnea) 2004    intolerant of CPAP  . History of pneumonia 2009    Hospitalized  . DDD (degenerative disc disease), lumbar 2010    laminectomy 01/2009  . BPH (benign prostatic hypertrophy)   . Insomnia   . Rhinitis, chronic   . Erectile dysfunction   . 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 History of colon polyps    Past surgical, social, and family history reviewed and no changes noted since last office visit.  MEDS:  Outpatient Prescriptions Prior to Visit  Medication Sig Dispense Refill  . aspirin 325 MG tablet Take 325 mg by mouth daily.        . Choline Fenofibrate (FENOFIBRIC ACID) 135 MG CPDR take 1 capsule by mouth once daily  90 capsule  1  . Dextromethorphan-Guaifenesin (MUCINEX DM MAXIMUM STRENGTH) 60-1200 MG per 12 hr tablet Take 2 tablets by mouth at  bedtime as needed.        [name] escitalopram (LEXAPRO) 10 MG tablet take 1 tablet by mouth once daily  90 tablet  3  . Exenatide (BYDUREON) 2 MG SUSR Inject 2 mg into the skin once a week.  4 each  12  . finasteride (PROSCAR) 5 MG tablet take 1 tablet by mouth once daily  90 tablet  1  . glucose blood (ONE TOUCH ULTRA TEST) test strip Use as instructed.  DX 250.00  100 each  12  . lisinopril (PRINIVIL,ZESTRIL) 20 MG tablet take 1 tablet by mouth once daily  30 tablet  5  . metFORMIN (GLUCOPHAGE) 500 MG tablet take 2 tablets by mouth twice a day with food  120 tablet  5  . Naproxen Sodium (ALEVE) 220 MG CAPS Take 2 capsules by mouth at bedtime as needed.        . Omega-3 Fatty Acids (FISH OIL) 1000 MG CAPS Take 1 capsule by mouth 2 (two) times daily.      . pseudoephedrine (SUDAFED) 30 MG tablet Take 60 mg by mouth at bedtime as needed.        . rosuvastatin (CRESTOR) 40 MG tablet Take 1 tablet (  40 mg total) by mouth daily.  90 tablet  3  . sildenafil (VIAGRA) 50 MG tablet Take 50 mg by mouth daily as needed.        . tamsulosin (FLOMAX) 0.4 MG CAPS Take 2 capsules (0.8 mg total) by mouth at bedtime.  180 capsule  4  . vitamin B-12 (CYANOCOBALAMIN) 1000 MCG tablet Take 1,000 mcg by mouth daily.        . zaleplon (SONATA) 10 MG capsule Take 1 capsule (10 mg total) by mouth at bedtime as needed.  12 capsule  5  . zoster vaccine live, PF, (ZOSTAVAX) 16109 UNT/0.65ML injection Inject 19,400 Units into the skin once.  1 vial  0   No facility-administered medications prior to visit.    PE: Blood pressure 134/89, pulse 88, temperature 98.4 F (36.9 C), temperature source Temporal, resp. rate 18, height 5\' 10"  (1.778 m), weight 273 lb (123.832 kg), SpO2 95.00%. VS: noted--normal. Gen: alert, NAD, NONTOXIC APPEARING. HEENT: eyes without injection, drainage, or swelling.  Ears: EACs clear, TMs with normal light reflex and landmarks.  Nose: Clear rhinorrhea, with some dried, crusty exudate adherent to  mildly injected mucosa.  No purulent d/c.  No paranasal sinus TTP.  No facial swelling.  No jaw swelling.  He has a left mandibular molar that is eroded and the cap is off, and the anteromedial 1/4 of the tooth is fractured off. Throat and mouth without focal lesion.  No pharyngial swelling, erythema, or exudate.   Neck: supple, no LAD.  No tenderness to palpation. LUNGS: CTA bilat, nonlabored resps.   CV: RRR, no m/r/g. EXT: no c/c/e SKIN: no rash  LAB: Rapid strep today NEG  IMPRESSION AND PLAN:  Viral URI Self-limited nature of this illness was discussed, questions answered.  Discussed symptomatic care, rest, fluids.   Warning signs/symptoms of worsening illness were discussed.  Patient instructed to call or return if any of these occur.   Tooth pain I don't think he has a dental infection/abscess. Vicodin 5/325, 1-2 q6h prn, #30, no RF. Keep oral surgeon appt coming up this Monday (5d). May continue alleve with food as well.  Group A strep throat clx sent.  An After Visit Summary was printed and given to the patient.  FOLLOW UP: prn

## 2013-06-23 LAB — CULTURE, GROUP A STREP: Organism ID, Bacteria: NORMAL

## 2013-07-06 ENCOUNTER — Other Ambulatory Visit: Payer: Self-pay | Admitting: Family Medicine

## 2013-07-25 ENCOUNTER — Other Ambulatory Visit: Payer: Self-pay | Admitting: Family Medicine

## 2013-08-08 ENCOUNTER — Other Ambulatory Visit: Payer: Self-pay | Admitting: Family Medicine

## 2013-08-23 ENCOUNTER — Other Ambulatory Visit: Payer: Self-pay | Admitting: Family Medicine

## 2013-09-10 ENCOUNTER — Other Ambulatory Visit: Payer: Self-pay | Admitting: Family Medicine

## 2013-10-05 ENCOUNTER — Other Ambulatory Visit: Payer: Self-pay

## 2013-10-25 ENCOUNTER — Encounter: Payer: Self-pay | Admitting: Family Medicine

## 2013-11-06 ENCOUNTER — Other Ambulatory Visit: Payer: Self-pay | Admitting: Family Medicine

## 2013-11-06 DIAGNOSIS — I10 Essential (primary) hypertension: Secondary | ICD-10-CM

## 2013-11-06 DIAGNOSIS — E119 Type 2 diabetes mellitus without complications: Secondary | ICD-10-CM

## 2013-11-09 ENCOUNTER — Other Ambulatory Visit: Payer: BC Managed Care – PPO | Admitting: Family Medicine

## 2013-11-09 ENCOUNTER — Other Ambulatory Visit: Payer: Self-pay | Admitting: Family Medicine

## 2013-11-09 LAB — HEMOGLOBIN A1C
Hgb A1c MFr Bld: 6.9 % — ABNORMAL HIGH (ref <5.7)
Mean Plasma Glucose: 151 mg/dL — ABNORMAL HIGH (ref <117)

## 2013-11-09 LAB — BASIC METABOLIC PANEL
BUN: 17 mg/dL (ref 6–23)
CO2: 27 mEq/L (ref 19–32)
Calcium: 9.6 mg/dL (ref 8.4–10.5)
Chloride: 101 mEq/L (ref 96–112)
Creat: 0.84 mg/dL (ref 0.50–1.35)
Glucose, Bld: 121 mg/dL — ABNORMAL HIGH (ref 70–99)
Potassium: 4.1 mEq/L (ref 3.5–5.3)
Sodium: 138 mEq/L (ref 135–145)

## 2013-11-16 ENCOUNTER — Ambulatory Visit: Payer: BC Managed Care – PPO | Admitting: Family Medicine

## 2013-11-27 ENCOUNTER — Ambulatory Visit (INDEPENDENT_AMBULATORY_CARE_PROVIDER_SITE_OTHER): Payer: BC Managed Care – PPO | Admitting: Family Medicine

## 2013-11-27 ENCOUNTER — Encounter: Payer: Self-pay | Admitting: Family Medicine

## 2013-11-27 VITALS — BP 104/72 | HR 99 | Temp 97.8°F | Resp 18 | Ht 70.0 in | Wt 274.0 lb

## 2013-11-27 DIAGNOSIS — I10 Essential (primary) hypertension: Secondary | ICD-10-CM

## 2013-11-27 DIAGNOSIS — Z23 Encounter for immunization: Secondary | ICD-10-CM

## 2013-11-27 DIAGNOSIS — Z860101 Personal history of adenomatous and serrated colon polyps: Secondary | ICD-10-CM

## 2013-11-27 DIAGNOSIS — E785 Hyperlipidemia, unspecified: Secondary | ICD-10-CM

## 2013-11-27 DIAGNOSIS — E119 Type 2 diabetes mellitus without complications: Secondary | ICD-10-CM

## 2013-11-27 DIAGNOSIS — Z86010 Personal history of colonic polyps: Secondary | ICD-10-CM

## 2013-11-27 NOTE — Assessment & Plan Note (Signed)
Tolerating Crestor 40mg  qd for the last 6 mo. Will recheck FLP at next f/u in 50mo.

## 2013-11-27 NOTE — Assessment & Plan Note (Signed)
Pt says at next f/u visit in 6 mo he'll be ok with re-referral to GI for f/u on this problem.

## 2013-11-27 NOTE — Progress Notes (Signed)
Pre visit review using our clinic review tool, if applicable. No additional management support is needed unless otherwise documented below in the visit note. 

## 2013-11-27 NOTE — Assessment & Plan Note (Signed)
The current medical regimen is effective;  continue present plan and medications. All monitoring/screening is UTD at this time.

## 2013-11-27 NOTE — Progress Notes (Signed)
OFFICE NOTE  11/27/2013  CC:  Chief Complaint  Patient presents with  . Follow-up     HPI: Patient is a 62 y.o. Caucasian male who is here for 6 mo f/u DM 2, HTN, hyperlipidemia. Compliant with chronic meds, diet.  Has had some excessive fatigue last 1 wk or so.   Had a stressful visit with his mother over the holidays and she has alzheimers dz that has been progressing, needs NH placement.  Pertinent PMH:  Past Medical History  Diagnosis Date  . History of scarlet fever     age 46  . 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 Keratoconus     dx'd 1973  . Depression     Hospitalized for suicidal ideation 44.  Has been on lexapro since 10/2008.  [name] Hyperlipidemia, mixed 1993  . Hypertension 2009    "marked chronic cardiomegaly" on CXR 01/2009 per old records.  . Diabetes mellitus type II     Dx'd approximately 06/2008  . OSA (obstructive sleep apnea) 2004    intolerant of CPAP  . History of pneumonia 2009    Hospitalized  . DDD (degenerative disc disease), lumbar 2010    laminectomy 01/2009  . BPH (benign prostatic hypertrophy)   . Insomnia   . Rhinitis, chronic   . Erectile dysfunction   . 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 History of colon polyps    Past surgical, social, and family history reviewed and no changes noted since last office visit.  MEDS:  Outpatient Prescriptions Prior to Visit  Medication Sig Dispense Refill  . aspirin 325 MG tablet Take 325 mg by mouth daily.        . Choline Fenofibrate (FENOFIBRIC ACID) 135 MG CPDR take 1 capsule by mouth once daily  90 capsule  1  . Dextromethorphan-Guaifenesin (MUCINEX DM MAXIMUM STRENGTH) 60-1200 MG per 12 hr tablet Take 2 tablets by mouth at bedtime as needed.        [name] escitalopram (LEXAPRO) 10 MG tablet take 1 tablet by mouth once daily  90 tablet  1  . Exenatide (BYDUREON) 2 MG SUSR Inject 2 mg into the skin once a week.  4 each  12   . finasteride (PROSCAR) 5 MG tablet take 1 tablet by mouth once daily  90 tablet  1  . HYDROcodone-acetaminophen (NORCO/VICODIN) 5-325 MG per tablet 1-2 tabs q6h prn  30 tablet  0  . lisinopril (PRINIVIL,ZESTRIL) 20 MG tablet take 1 tablet by mouth once daily  30 tablet  5  . metFORMIN (GLUCOPHAGE) 500 MG tablet take 2 tablets by mouth twice a day with food  120 tablet  5  . Naproxen Sodium (ALEVE) 220 MG CAPS Take 2 capsules by mouth at bedtime as needed.        . Omega-3 Fatty Acids (FISH OIL) 1000 MG CAPS Take 1 capsule by mouth 2 (two) times daily.      . rosuvastatin (CRESTOR) 40 MG tablet Take 1 tablet (40 mg total) by mouth daily.  90 tablet  3  . tamsulosin (FLOMAX) 0.4 MG CAPS Take 2 capsules (0.8 mg total) by mouth at bedtime.  180 capsule  4  . vitamin B-12 (CYANOCOBALAMIN) 1000 MCG tablet Take 1,000 mcg by mouth daily.        . zaleplon (SONATA) 10 MG capsule Take 1 capsule (10 mg total) by mouth at  bedtime as needed.  12 capsule  5  . zoster vaccine live, PF, (ZOSTAVAX) 91478 UNT/0.65ML injection Inject 19,400 Units into the skin once.  1 vial  0  . glucose blood (ONE TOUCH ULTRA TEST) test strip Use as instructed.  DX 250.00  100 each  12  . pseudoephedrine (SUDAFED) 30 MG tablet Take 60 mg by mouth at bedtime as needed.        . sildenafil (VIAGRA) 50 MG tablet Take 50 mg by mouth daily as needed.         No facility-administered medications prior to visit.    PE: Blood pressure 104/72, pulse 99, temperature 97.8 F (36.6 C), temperature source Temporal, resp. rate 18, height 5\' 10"  (1.778 m), weight 274 lb (124.286 kg), SpO2 95.00%. Gen: Alert, well appearing.  Patient is oriented to person, place, time, and situation. No further exam today.  IMPRESSION AND PLAN:  Type II or unspecified type diabetes mellitus without mention of complication, not stated as uncontrolled The current medical regimen is effective;  continue present plan and medications. All  monitoring/screening is UTD at this time.  HTN (hypertension), benign The current medical regimen is effective;  continue present plan and medications.   Hx of adenomatous colonic polyps Pt says at next f/u visit in 6 mo he'll be ok with re-referral to GI for f/u on this problem.  Hyperlipidemia Tolerating Crestor 40mg  qd for the last 6 mo. Will recheck FLP at next f/u in 75mo.   An After Visit Summary was printed and given to the patient.  Flu IM today.  FOLLOW UP:  75mo for CPE with fasting HP + PSA

## 2013-11-27 NOTE — Assessment & Plan Note (Signed)
The current medical regimen is effective;  continue present plan and medications.  

## 2014-01-22 ENCOUNTER — Other Ambulatory Visit: Payer: Self-pay

## 2014-01-22 MED ORDER — ESCITALOPRAM OXALATE 10 MG PO TABS: ORAL_TABLET | ORAL | Status: DC

## 2014-01-29 ENCOUNTER — Other Ambulatory Visit: Payer: Self-pay

## 2014-01-29 MED ORDER — LISINOPRIL 20 MG PO TABS: ORAL_TABLET | ORAL | Status: DC

## 2014-02-20 ENCOUNTER — Other Ambulatory Visit: Payer: Self-pay | Admitting: Family Medicine

## 2014-02-20 MED ORDER — FENOFIBRIC ACID 135 MG PO CPDR: DELAYED_RELEASE_CAPSULE | ORAL | Status: DC

## 2014-03-06 ENCOUNTER — Other Ambulatory Visit: Payer: Self-pay | Admitting: Family Medicine

## 2014-03-06 MED ORDER — METFORMIN HCL 500 MG PO TABS
ORAL_TABLET | ORAL | Status: DC
Start: 2014-03-06 — End: 2014-05-10

## 2014-03-27 ENCOUNTER — Other Ambulatory Visit: Payer: Self-pay

## 2014-03-27 MED ORDER — FINASTERIDE 5 MG PO TABS
ORAL_TABLET | ORAL | Status: DC
Start: 2014-03-27 — End: 2014-06-05

## 2014-04-18 ENCOUNTER — Other Ambulatory Visit: Payer: Self-pay | Admitting: Family Medicine

## 2014-04-18 MED ORDER — ESCITALOPRAM OXALATE 10 MG PO TABS: ORAL_TABLET | ORAL | Status: DC

## 2014-05-10 ENCOUNTER — Other Ambulatory Visit: Payer: Self-pay | Admitting: Family Medicine

## 2014-05-10 MED ORDER — METFORMIN HCL 500 MG PO TABS: ORAL_TABLET | ORAL | Status: DC

## 2014-05-21 ENCOUNTER — Telehealth: Payer: Self-pay

## 2014-05-21 ENCOUNTER — Other Ambulatory Visit (INDEPENDENT_AMBULATORY_CARE_PROVIDER_SITE_OTHER): Payer: BC Managed Care – PPO

## 2014-05-21 DIAGNOSIS — Z0000 Encounter for general adult medical examination without abnormal findings: Secondary | ICD-10-CM

## 2014-05-21 LAB — COMPREHENSIVE METABOLIC PANEL
ALT: 25 U/L (ref 0–53)
AST: 18 U/L (ref 0–37)
Albumin: 4 g/dL (ref 3.5–5.2)
Alkaline Phosphatase: 40 U/L (ref 39–117)
BUN: 16 mg/dL (ref 6–23)
CO2: 29 mEq/L (ref 19–32)
Calcium: 9.4 mg/dL (ref 8.4–10.5)
Chloride: 98 mEq/L (ref 96–112)
Creatinine, Ser: 0.9 mg/dL (ref 0.4–1.5)
GFR: 93.44 mL/min (ref >60.00)
Glucose, Bld: 281 mg/dL — ABNORMAL HIGH (ref 70–99)
Potassium: 4.3 mEq/L (ref 3.5–5.1)
Sodium: 136 mEq/L (ref 135–145)
Total Bilirubin: 0.5 mg/dL (ref 0.2–1.2)
Total Protein: 6.2 g/dL (ref 6.0–8.3)

## 2014-05-21 LAB — CBC WITH DIFFERENTIAL/PLATELET
Basophils Absolute: 0 10*3/uL (ref 0.0–0.1)
Basophils Relative: 0.4 % (ref 0.0–3.0)
Eosinophils Absolute: 0.1 10*3/uL (ref 0.0–0.7)
Eosinophils Relative: 2.2 % (ref 0.0–5.0)
HCT: 39.1 % (ref 39.0–52.0)
Hemoglobin: 13 g/dL (ref 13.0–17.0)
Lymphocytes Relative: 48.8 % — ABNORMAL HIGH (ref 12.0–46.0)
Lymphs Abs: 3 10*3/uL (ref 0.7–4.0)
MCHC: 33.3 g/dL (ref 30.0–36.0)
MCV: 87.8 fl (ref 78.0–100.0)
Monocytes Absolute: 0.4 10*3/uL (ref 0.1–1.0)
Monocytes Relative: 6.6 % (ref 3.0–12.0)
Neutro Abs: 2.6 10*3/uL (ref 1.4–7.7)
Neutrophils Relative %: 42 % — ABNORMAL LOW (ref 43.0–77.0)
Platelets: 211 10*3/uL (ref 150.0–400.0)
RBC: 4.45 Mil/uL (ref 4.22–5.81)
RDW: 13.6 % (ref 11.5–15.5)
WBC: 6.2 10*3/uL (ref 4.0–10.5)

## 2014-05-21 LAB — LIPID PANEL
Cholesterol: 180 mg/dL (ref 0–200)
HDL: 42.1 mg/dL (ref >39.00)
LDL Cholesterol: 37 mg/dL (ref 0–99)
NonHDL: 137.9
Total CHOL/HDL Ratio: 4
Triglycerides: 503 mg/dL — ABNORMAL HIGH (ref 0.0–149.0)
VLDL: 100.6 mg/dL — ABNORMAL HIGH (ref 0.0–40.0)

## 2014-05-21 LAB — TSH: TSH: 1.46 u[IU]/mL (ref 0.35–4.50)

## 2014-05-21 LAB — PSA: PSA: 0.02 ng/mL — ABNORMAL LOW (ref 0.10–4.00)

## 2014-05-21 LAB — HEMOGLOBIN A1C: Hgb A1c MFr Bld: 10.9 % — ABNORMAL HIGH (ref 4.6–6.5)

## 2014-05-21 NOTE — Telephone Encounter (Signed)
[place] labs sent.

## 2014-05-21 NOTE — Telephone Encounter (Signed)
Yes, please add these tests.  Patient is due.

## 2014-05-21 NOTE — Telephone Encounter (Signed)
Dr Anitra Lauth, pt came in for blood draw. Did you want an AIC and PSA? Please advise.

## 2014-05-28 ENCOUNTER — Ambulatory Visit (INDEPENDENT_AMBULATORY_CARE_PROVIDER_SITE_OTHER): Payer: BC Managed Care – PPO | Admitting: Family Medicine

## 2014-05-28 ENCOUNTER — Encounter: Payer: Self-pay | Admitting: Family Medicine

## 2014-05-28 VITALS — BP 126/83 | HR 96 | Temp 98.4°F | Resp 18 | Ht 70.0 in | Wt 278.0 lb

## 2014-05-28 DIAGNOSIS — IMO0001 Reserved for inherently not codable concepts without codable children: Secondary | ICD-10-CM

## 2014-05-28 DIAGNOSIS — E1165 Type 2 diabetes mellitus with hyperglycemia: Secondary | ICD-10-CM

## 2014-05-28 DIAGNOSIS — Z860100 Personal history of colon polyps, unspecified: Secondary | ICD-10-CM

## 2014-05-28 DIAGNOSIS — Z0000 Encounter for general adult medical examination without abnormal findings: Secondary | ICD-10-CM

## 2014-05-28 DIAGNOSIS — E119 Type 2 diabetes mellitus without complications: Secondary | ICD-10-CM | POA: Insufficient documentation

## 2014-05-28 LAB — MICROALBUMIN / CREATININE URINE RATIO
Creatinine,U: 280.2 mg/dL
Microalb Creat Ratio: 0.3 mg/g (ref 0.0–30.0)
Microalb, Ur: 0.9 mg/dL (ref 0.0–1.9)

## 2014-05-28 MED ORDER — INSULIN DETEMIR 100 UNIT/ML FLEXPEN: PEN_INJECTOR | SUBCUTANEOUS | Status: DC

## 2014-05-28 NOTE — Progress Notes (Signed)
Office Note 05/28/2014  CC:  Chief Complaint  Patient presents with  . Annual Exam  . Memory Loss    short term    HPI:  [name] [name] is a 63 y.o. White male who is here for CPE.  Reviewed recent fasting labs in detail with pt today. Not on bydureon last 5 mo b/c insurer won't cover it now.  Trying to eat diab diet--"pretty good", no exercise.  Metformin daily.    Past Medical History  Diagnosis Date  . History of scarlet fever     age 44  . 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 Keratoconus     dx'd 1973  . Depression     Hospitalized for suicidal ideation 26.  Has been on lexapro since 10/2008.  [name] Hyperlipidemia, mixed 1993  . Hypertension 2009    "marked chronic cardiomegaly" on CXR 01/2009 per old records.  . Diabetes mellitus type II     Dx'd approximately 06/2008  . OSA (obstructive sleep apnea) 2004    intolerant of CPAP  . History of pneumonia 2009    Hospitalized  . DDD (degenerative disc disease), lumbar 2010    laminectomy 01/2009  . BPH (benign prostatic hypertrophy)   . Insomnia   . Rhinitis, chronic   . Erectile dysfunction   . 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 History of colon polyps     Past Surgical History  Procedure Laterality Date  . Appendectomy  1972  . Vasectomy  1994  . 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 Tonsillectomy and adenoidectomy      age 67  . Urethral dilation      2 as a child, one as an adult  . Colonoscopy  X 3    Most recent was 2005, with removal of polyps in the first two.  [name] Electrocardiogram  01/29/2009    NORMAL  . Lumbar laminectomy  01/2009    Family History  Problem Relation Age of Onset  . Heart disease Mother   . Hyperlipidemia Mother   . Stroke Mother   . Diabetes Mother   . Depression Mother   . Alzheimer's disease Mother   . Stroke Father   . Hyperlipidemia Father   . Heart disease Father   . Diabetes Father    . Alcohol abuse Sister   . Depression Sister     History   Social History  . Marital Status: Married    Spouse Name: N/A    Number of Children: N/A  . Years of Education: N/A   Occupational History  . Not on file.   Social History Main Topics  . Smoking status: Never Smoker   . Smokeless tobacco: Never Used  . Alcohol Use: Yes     Comment: social, 3-4 drinks month  . Drug Use: No  . Sexual Activity: Not on file   Other Topics Concern  . Not on file   Social History Narrative   Divorced, then remarried.  Has 3 sons, no grandchildren.   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   No tobacco.  Rare ETOH.  No drug abuse.   Enjoys reading and spending time with his 2 dogs.    Outpatient Prescriptions Prior to Visit  Medication Sig Dispense Refill  . aspirin 325  MG tablet Take 325 mg by mouth daily.        . Choline Fenofibrate (FENOFIBRIC ACID) 135 MG CPDR take 1 capsule by mouth once daily  90 capsule  1  . clindamycin (CLEOCIN) 150 MG capsule Take 150 mg by mouth 3 (three) times daily.      [name] Dextromethorphan-Guaifenesin (MUCINEX DM MAXIMUM STRENGTH) 60-1200 MG per 12 hr tablet Take 2 tablets by mouth at bedtime as needed.        [name] escitalopram (LEXAPRO) 10 MG tablet take 1 tablet by mouth once daily  90 tablet  0  . finasteride (PROSCAR) 5 MG tablet take 1 tablet by mouth once daily  90 tablet  0  . HYDROcodone-acetaminophen (NORCO/VICODIN) 5-325 MG per tablet 1-2 tabs q6h prn  30 tablet  0  . lisinopril (PRINIVIL,ZESTRIL) 20 MG tablet take 1 tablet by mouth once daily  30 tablet  3  . metFORMIN (GLUCOPHAGE) 500 MG tablet take 2 tablets by mouth twice a day with food  120 tablet  0  . Naproxen Sodium (ALEVE) 220 MG CAPS Take 2 capsules by mouth at bedtime as needed.        . Omega-3 Fatty Acids (FISH OIL) 1000 MG CAPS Take 1 capsule by mouth 2 (two) times daily.      . pseudoephedrine (SUDAFED) 30 MG  tablet Take 60 mg by mouth at bedtime as needed.        . rosuvastatin (CRESTOR) 40 MG tablet Take 1 tablet (40 mg total) by mouth daily.  90 tablet  3  . tamsulosin (FLOMAX) 0.4 MG CAPS Take 2 capsules (0.8 mg total) by mouth at bedtime.  180 capsule  4  . vitamin B-12 (CYANOCOBALAMIN) 1000 MCG tablet Take 1,000 mcg by mouth daily.        [name] glucose blood (ONE TOUCH ULTRA TEST) test strip Use as instructed.  DX 250.00  100 each  12  . sildenafil (VIAGRA) 50 MG tablet Take 50 mg by mouth daily as needed.        . zaleplon (SONATA) 10 MG capsule Take 1 capsule (10 mg total) by mouth at bedtime as needed.  12 capsule  5  . zoster vaccine live, PF, (ZOSTAVAX) 31497 UNT/0.65ML injection Inject 19,400 Units into the skin once.  1 vial  0  . Exenatide (BYDUREON) 2 MG SUSR Inject 2 mg into the skin once a week.  4 each  12   No facility-administered medications prior to visit.    Allergies  Allergen Reactions  . Simvastatin Other (See Comments)    myalgias  . Antihistamines, Diphenhydramine-Type Other (See Comments)    depression  . Codeine Nausea Only  . Penicillins Hives  . Sulfa Antibiotics Hives    ROS Review of Systems  Constitutional: Negative for fever, chills, appetite change and fatigue.  HENT: Negative for congestion, dental problem, ear pain and sore throat.   Eyes: Negative for discharge, redness and visual disturbance.  Respiratory: Negative for cough, chest tightness, shortness of breath and wheezing.   Cardiovascular: Negative for chest pain, palpitations and leg swelling.  Gastrointestinal: Negative for nausea, vomiting, abdominal pain, diarrhea and blood in stool.  Genitourinary: Negative for dysuria, urgency, frequency, hematuria, flank pain and difficulty urinating.  Musculoskeletal: Negative for arthralgias, back pain, joint swelling, myalgias and neck stiffness.  Skin: Negative for pallor and rash.  Neurological: Negative for dizziness, speech difficulty, weakness and  headaches.  Hematological: Negative for adenopathy. Does not bruise/bleed easily.  Psychiatric/Behavioral:  Negative for confusion and sleep disturbance. The patient is not nervous/anxious.        Memory complaints    PE; Blood pressure 126/83, pulse 96, temperature 98.4 F (36.9 C), temperature source Temporal, resp. rate 18, height 5\' 10"  (1.778 m), weight 278 lb (126.1 kg), SpO2 94.00%. Gen: Alert, well appearing, morbidly obese-appearing.  Patient is oriented to person, place, time, and situation. AFFECT: pleasant, lucid thought and speech. ENT: Ears: EACs clear, normal epithelium.  TMs with good light reflex and landmarks bilaterally.  Eyes: no injection, icteris, swelling, or exudate.  EOMI, PERRLA. Nose: no drainage or turbinate edema/swelling.  No injection or focal lesion.  Mouth: lips without lesion/swelling.  Oral mucosa pink and moist.  Dentition intact and without obvious caries or gingival swelling.  Oropharynx without erythema, exudate, or swelling.  Neck: supple/nontender.  No LAD, mass, or TM.  Carotid pulses 2+ bilaterally, without bruits. CV: RRR, no m/r/g.   LUNGS: CTA bilat, nonlabored resps, good aeration in all lung fields. ABD: soft, NT, ND, BS normal.  No hepatospenomegaly or mass.  No bruits. EXT: no clubbing, cyanosis, or edema.  Musculoskeletal: no joint swelling, erythema, warmth, or tenderness.  ROM of all joints intact. Skin - no sores or suspicious lesions or rashes or color changes Rectal exam: negative without mass, lesions or tenderness, PROSTATE EXAM: smooth and symmetric without nodules or tenderness.  Pertinent labs:  Lab Results  Component Value Date   TSH 1.46 05/21/2014   Lab Results  Component Value Date   WBC 6.2 05/21/2014   HGB 13.0 05/21/2014   HCT 39.1 05/21/2014   MCV 87.8 05/21/2014   PLT 211.0 05/21/2014   Lab Results  Component Value Date   CREATININE 0.9 05/21/2014   BUN 16 05/21/2014   NA 136 05/21/2014   K 4.3 05/21/2014   CL 98  05/21/2014   CO2 29 05/21/2014   Lab Results  Component Value Date   ALT 25 05/21/2014   AST 18 05/21/2014   ALKPHOS 40 05/21/2014   BILITOT 0.5 05/21/2014   Lab Results  Component Value Date   CHOL 180 05/21/2014   Lab Results  Component Value Date   HDL 42.10 05/21/2014   Lab Results  Component Value Date   LDLCALC 37 05/21/2014   Lab Results  Component Value Date   TRIG 503.0* 05/21/2014   Lab Results  Component Value Date   CHOLHDL 4 05/21/2014   Lab Results  Component Value Date   PSA 0.02* 05/21/2014   PSA 0.03* 05/10/2013   PSA 0.12 07/09/2011   ASSESSMENT AND PLAN:   Type II or unspecified type diabetes mellitus without mention of complication, uncontrolled Continue metformin 1000 mg bid. Start levemir 10 U SQ qhs.  Titration discussed. Urine micro/cr today.  Health maintenance examination Reviewed age and gender appropriate health maintenance issues (prudent diet, regular exercise, health risks of tobacco and excessive alcohol, use of seatbelts, fire alarms in home, use of sunscreen).  Also reviewed age and gender appropriate health screening as well as vaccine recommendations.   An After Visit Summary was printed and given to the patient.   FOLLOW UP:  Return for 30 min appt at pt's convenience to discuss memory complaint.

## 2014-05-28 NOTE — Patient Instructions (Signed)
Check glucose twice a day: once fasting and once 2 hours after eating a meal.  Goal fasting glucose is 100-110 range.  Adjust your levemir up 1 U every night until your fasting glucose is in the 100-110 range consistently.

## 2014-05-28 NOTE — Assessment & Plan Note (Addendum)
Reviewed age and gender appropriate health maintenance issues (prudent diet, regular exercise, health risks of tobacco and excessive alcohol, use of seatbelts, fire alarms in home, use of sunscreen).  Also reviewed age and gender appropriate health screening as well as vaccine recommendations. GI referral today for f/u adenomatous colon polyps. DRE normal today, as was recent PSA.

## 2014-05-28 NOTE — Assessment & Plan Note (Signed)
Continue metformin 1000 mg bid. Start levemir 10 U SQ qhs.  Titration discussed. Urine micro/cr today.

## 2014-05-28 NOTE — Progress Notes (Signed)
Pre visit review using our clinic review tool, if applicable. No additional management support is needed unless otherwise documented below in the visit note. 

## 2014-06-01 ENCOUNTER — Other Ambulatory Visit: Payer: Self-pay | Admitting: Family Medicine

## 2014-06-05 MED ORDER — LISINOPRIL 20 MG PO TABS: ORAL_TABLET | ORAL | Status: DC

## 2014-06-05 MED ORDER — ROSUVASTATIN CALCIUM 40 MG PO TABS: 40.0000 mg | ORAL_TABLET | Freq: Every day | ORAL | Status: DC

## 2014-06-05 MED ORDER — FINASTERIDE 5 MG PO TABS: ORAL_TABLET | ORAL | Status: DC

## 2014-06-05 MED ORDER — METFORMIN HCL 500 MG PO TABS: ORAL_TABLET | ORAL | Status: DC

## 2014-06-05 MED ORDER — ESCITALOPRAM OXALATE 10 MG PO TABS: ORAL_TABLET | ORAL | Status: DC

## 2014-06-13 ENCOUNTER — Ambulatory Visit: Payer: BC Managed Care – PPO | Admitting: Family Medicine

## 2014-06-22 ENCOUNTER — Encounter: Payer: Self-pay | Admitting: Family Medicine

## 2014-07-02 ENCOUNTER — Encounter: Payer: Self-pay | Admitting: Family Medicine

## 2014-07-02 ENCOUNTER — Other Ambulatory Visit: Payer: Self-pay | Admitting: Family Medicine

## 2014-07-02 MED ORDER — TAMSULOSIN HCL 0.4 MG PO CAPS: 0.8000 mg | ORAL_CAPSULE | Freq: Every day | ORAL | Status: DC

## 2014-07-27 ENCOUNTER — Ambulatory Visit: Payer: BC Managed Care – PPO | Admitting: Family Medicine

## 2014-07-27 ENCOUNTER — Ambulatory Visit (INDEPENDENT_AMBULATORY_CARE_PROVIDER_SITE_OTHER): Payer: BC Managed Care – PPO | Admitting: Family Medicine

## 2014-07-27 ENCOUNTER — Encounter: Payer: Self-pay | Admitting: Family Medicine

## 2014-07-27 VITALS — BP 120/76 | HR 93 | Temp 97.4°F | Resp 18 | Ht 70.0 in | Wt 274.0 lb

## 2014-07-27 DIAGNOSIS — IMO0001 Reserved for inherently not codable concepts without codable children: Secondary | ICD-10-CM

## 2014-07-27 DIAGNOSIS — Z86010 Personal history of colonic polyps: Secondary | ICD-10-CM

## 2014-07-27 DIAGNOSIS — Z23 Encounter for immunization: Secondary | ICD-10-CM

## 2014-07-27 DIAGNOSIS — E1165 Type 2 diabetes mellitus with hyperglycemia: Principal | ICD-10-CM

## 2014-07-27 DIAGNOSIS — E785 Hyperlipidemia, unspecified: Secondary | ICD-10-CM

## 2014-07-27 DIAGNOSIS — I10 Essential (primary) hypertension: Secondary | ICD-10-CM

## 2014-07-27 MED ORDER — GLUCOSE BLOOD VI STRP: ORAL_STRIP | Status: DC

## 2014-07-27 MED ORDER — INSULIN LISPRO 100 UNIT/ML ~~LOC~~ SOLN: SUBCUTANEOUS | Status: DC

## 2014-07-27 MED ORDER — INSULIN PEN NEEDLE 32G X 6 MM MISC: Status: DC

## 2014-07-27 NOTE — Progress Notes (Signed)
OFFICE NOTE  07/29/2014  CC:  Chief Complaint  Patient presents with  . Diabetes   HPI: Patient is a 63 y.o. Caucasian male who is here for 2 mo f/u DM, hyperlipidemia, HTN. Started levemir last visit b/c A1c was up to 10.9 %.   Direct LDL was 100, trigs 503.  No new cholest meds started. Home bp's have been normal.  Says he is feeling good. He did not start the injections until 1 mo ago: titrated up to 30 U qhs now.  Pertinent PMH:  Past medical, surgical, social, and family history reviewed and no changes are noted since last office visit.  MEDS:  Outpatient Prescriptions Prior to Visit  Medication Sig Dispense Refill  . aspirin 325 MG tablet Take 325 mg by mouth daily.        . Choline Fenofibrate (FENOFIBRIC ACID) 135 MG CPDR take 1 capsule by mouth once daily  90 capsule  1  . clindamycin (CLEOCIN) 150 MG capsule Take 150 mg by mouth 3 (three) times daily.      [name] Dextromethorphan-Guaifenesin (MUCINEX DM MAXIMUM STRENGTH) 60-1200 MG per 12 hr tablet Take 2 tablets by mouth at bedtime as needed.        [name] escitalopram (LEXAPRO) 10 MG tablet take 1 tablet by mouth once daily  90 tablet  1  . finasteride (PROSCAR) 5 MG tablet take 1 tablet by mouth once daily  90 tablet  1  . HYDROcodone-acetaminophen (NORCO/VICODIN) 5-325 MG per tablet 1-2 tabs q6h prn  30 tablet  0  . Insulin Detemir (LEVEMIR FLEXTOUCH) 100 UNIT/ML Pen 30 U SQ qhs  15 mL  11  . lisinopril (PRINIVIL,ZESTRIL) 20 MG tablet take 1 tablet by mouth once daily  90 tablet  1  . metFORMIN (GLUCOPHAGE) 500 MG tablet take 2 tablets by mouth twice a day with food  360 tablet  1  . Naproxen Sodium (ALEVE) 220 MG CAPS Take 2 capsules by mouth at bedtime as needed.        . Omega-3 Fatty Acids (FISH OIL) 1000 MG CAPS Take 1 capsule by mouth 2 (two) times daily.      . pseudoephedrine (SUDAFED) 30 MG tablet Take 60 mg by mouth at bedtime as needed.        . rosuvastatin (CRESTOR) 40 MG tablet Take 1 tablet (40 mg total) by  mouth daily.  90 tablet  1  . sildenafil (VIAGRA) 50 MG tablet Take 50 mg by mouth daily as needed.        . tamsulosin (FLOMAX) 0.4 MG CAPS capsule Take 2 capsules (0.8 mg total) by mouth at bedtime.  180 capsule  1  . vitamin B-12 (CYANOCOBALAMIN) 1000 MCG tablet Take 1,000 mcg by mouth daily.        . zaleplon (SONATA) 10 MG capsule Take 1 capsule (10 mg total) by mouth at bedtime as needed.  12 capsule  5  . zoster vaccine live, PF, (ZOSTAVAX) 67893 UNT/0.65ML injection Inject 19,400 Units into the skin once.  1 vial  0  . glucose blood (ONE TOUCH ULTRA TEST) test strip Use as instructed.  DX 250.00  100 each  12   No facility-administered medications prior to visit.    PE: Blood pressure 120/76, pulse 93, temperature 97.4 F (36.3 C), temperature source Temporal, resp. rate 18, height 5\' 10"  (1.778 m), weight 274 lb (124.286 kg), SpO2 95.00%. Gen: Alert, well appearing.  Patient is oriented to person, place, time, and situation. No  further exam today.  IMPRESSION AND PLAN:  Type II or unspecified type diabetes mellitus without mention of complication, uncontrolled Continue to titrate levemir to goal fasting CBG range of 100-110. Start humalog pen 5 U qAC. Continue home glucose monitoring (change frequency from bid to tid). Continue metformin.  Hyperlipidemia Tolerating crestor and fenofibrate, last lipids: Lab Results  Component Value Date   CHOL 180 05/21/2014   HDL 42.10 05/21/2014   LDLCALC 37 05/21/2014   LDLDIRECT 127.5 11/10/2012   TRIG 503.0* 05/21/2014   CHOLHDL 4 05/21/2014   We chose to do no changes to regimen since his diabetic control was so much worse at time of last lipid check.  We felt like getting good control of diabetes would result in natural improvement in lipid panel to some degree. We'll continue with this approach. Plan on recheck FLP in 90mo at next f/u.  HTN (hypertension), benign The current medical regimen is effective;  continue present plan and  medications.   Hx of adenomatous colonic polyps He is in the process of getting his old GI records so his new GI MD ([place]) can schedule appropriate f/u colonoscopy.   An After Visit Summary was printed and given to the patient.  FOLLOW UP: 2 mo, recheck HbA1c and FLP at that time.

## 2014-07-27 NOTE — Progress Notes (Signed)
Pre visit review using our clinic review tool, if applicable. No additional management support is needed unless otherwise documented below in the visit note. 

## 2014-07-27 NOTE — Patient Instructions (Signed)
Check your glucose fasting in the morning, at bedtime, and 2 hours after one of your meals every day (total of 3 checks per day). Increase your levemir by 1 unit daily until your fasting glucose is consistently in 100-110 range.

## 2014-07-29 NOTE — Assessment & Plan Note (Signed)
Tolerating crestor and fenofibrate, last lipids: Lab Results  Component Value Date   CHOL 180 05/21/2014   HDL 42.10 05/21/2014   LDLCALC 37 05/21/2014   LDLDIRECT 127.5 11/10/2012   TRIG 503.0* 05/21/2014   CHOLHDL 4 05/21/2014   We chose to do no changes to regimen since his diabetic control was so much worse at time of last lipid check.  We felt like getting good control of diabetes would result in natural improvement in lipid panel to some degree. We'll continue with this approach. Plan on recheck FLP in 52mo at next f/u.

## 2014-07-29 NOTE — Assessment & Plan Note (Signed)
He is in the process of getting his old GI records so his new GI MD ([place]) can schedule appropriate f/u colonoscopy.

## 2014-07-29 NOTE — Assessment & Plan Note (Signed)
The current medical regimen is effective;  continue present plan and medications.  

## 2014-07-29 NOTE — Assessment & Plan Note (Addendum)
Continue to titrate levemir to goal fasting CBG range of 100-110. Start humalog pen 5 U qAC. Continue home glucose monitoring (change frequency from bid to tid). Continue metformin.

## 2014-09-03 ENCOUNTER — Other Ambulatory Visit: Payer: Self-pay | Admitting: *Deleted

## 2014-09-03 MED ORDER — FENOFIBRIC ACID 135 MG PO CPDR: DELAYED_RELEASE_CAPSULE | ORAL | Status: DC

## 2014-09-26 ENCOUNTER — Ambulatory Visit: Payer: BC Managed Care – PPO | Admitting: Family Medicine

## 2014-11-22 ENCOUNTER — Emergency Department
Admission: EM | Admit: 2014-11-22 | Discharge: 2014-11-22 | Disposition: A | Discharge status: Home / Self Care | Payer: BC Managed Care – PPO | Source: Home / Self Care | Attending: Emergency Medicine | Admitting: Emergency Medicine

## 2014-11-22 DIAGNOSIS — S0502XA Injury of conjunctiva and corneal abrasion without foreign body, left eye, initial encounter: Secondary | ICD-10-CM

## 2014-11-22 MED ORDER — GENTAMICIN SULFATE 0.3 % OP SOLN: 2.0000 [drp] | OPHTHALMIC | Status: DC

## 2014-11-22 NOTE — ED Provider Notes (Signed)
CSN: [id number]     Arrival date & time 11/22/14  1131 History   First MD Initiated Contact with Patient 11/22/14 1204     Chief Complaint  Patient presents with  . Eye Problem   (Consider location/radiation/quality/duration/timing/severity/associated sxs/prior Treatment) HPI Patient c/o left eye irritation, burning and redness. Patient accidentally got the contact cleaning solution in his left eye this morning. Tried irrigating with saline afterward and that only helped a little bit. No other associated symptoms. No URI symptoms or fever or cough or cardiorespiratory symptoms. Past Medical History  Diagnosis Date  . History of scarlet fever     age 23  . 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 Keratoconus     dx'd 1973  . Depression     Hospitalized for suicidal ideation 37.  Has been on lexapro since 10/2008.  [name] Hyperlipidemia, mixed 1993  . Hypertension 2009    "marked chronic cardiomegaly" on CXR 01/2009 per old records.  . Diabetes mellitus type II     Dx'd approximately 06/2008  . OSA (obstructive sleep apnea) 2004    intolerant of CPAP  . History of pneumonia 2009    Hospitalized  . DDD (degenerative disc disease), lumbar 2010    laminectomy 01/2009  . BPH (benign prostatic hypertrophy)   . Insomnia   . Rhinitis, chronic   . Erectile dysfunction   . 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 History of colon polyps    Past Surgical History  Procedure Laterality Date  . Appendectomy  1972  . Vasectomy  1994  . 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 Tonsillectomy and adenoidectomy      age 37  . Urethral dilation      2 as a child, one as an adult  . Colonoscopy  X 3    Most recent was 2005, with removal of polyps in the first two.  [name] Electrocardiogram  01/29/2009    NORMAL  . Lumbar laminectomy  01/2009   Family History  Problem Relation Age of Onset  . Heart disease Mother   .  Hyperlipidemia Mother   . Stroke Mother   . Diabetes Mother   . Depression Mother   . Alzheimer's disease Mother   . Stroke Father   . Hyperlipidemia Father   . Heart disease Father   . Diabetes Father   . Alcohol abuse Sister   . Depression Sister    History  Substance Use Topics  . Smoking status: Never Smoker   . Smokeless tobacco: Never Used  . Alcohol Use: Yes     Comment: social, 3-4 drinks month    Review of Systems  All other systems reviewed and are negative.   Allergies  Simvastatin; Antihistamines, diphenhydramine-type; Codeine; Penicillins; and Sulfa antibiotics  Home Medications   Prior to Admission medications   Medication Sig Start Date End Date Taking? Authorizing Provider  aspirin 325 MG tablet Take 325 mg by mouth daily.     Yes Historical Provider, MD  Choline Fenofibrate (FENOFIBRIC ACID) 135 MG CPDR take 1 capsule by mouth once daily 09/03/14  Yes Tammi Sou, MD  clindamycin (CLEOCIN) 150 MG capsule Take 150 mg by mouth 3 (three) times daily.   Yes Historical Provider, MD  Dextromethorphan-Guaifenesin (MUCINEX DM MAXIMUM STRENGTH) 60-1200 MG per 12 hr tablet Take 2 tablets by mouth at bedtime  as needed.     Yes Historical Provider, MD  escitalopram (LEXAPRO) 10 MG tablet take 1 tablet by mouth once daily 06/05/14  Yes Tammi Sou, MD  finasteride (PROSCAR) 5 MG tablet take 1 tablet by mouth once daily 06/05/14  Yes Tammi Sou, MD  glucose blood (ONE TOUCH ULTRA TEST) test strip Check glucose three times per day 07/27/14  Yes Tammi Sou, MD  HYDROcodone-acetaminophen (NORCO/VICODIN) 5-325 MG per tablet 1-2 tabs q6h prn 06/21/13  Yes Tammi Sou, MD  Insulin Detemir (LEVEMIR FLEXTOUCH) 100 UNIT/ML Pen 30 U SQ qhs 05/28/14  Yes Tammi Sou, MD  insulin lispro (HUMALOG) 100 UNIT/ML injection 5 U SQ qAC 07/27/14  Yes Tammi Sou, MD  Insulin Pen Needle (NOVOFINE) 32G X 6 MM MISC Use for insulin injection 4 times per day 07/27/14   Yes Tammi Sou, MD  lisinopril (PRINIVIL,ZESTRIL) 20 MG tablet take 1 tablet by mouth once daily 06/05/14  Yes Tammi Sou, MD  metFORMIN (GLUCOPHAGE) 500 MG tablet take 2 tablets by mouth twice a day with food 06/05/14  Yes Tammi Sou, MD  Naproxen Sodium (ALEVE) 220 MG CAPS Take 2 capsules by mouth at bedtime as needed.     Yes Historical Provider, MD  Omega-3 Fatty Acids (FISH OIL) 1000 MG CAPS Take 1 capsule by mouth 2 (two) times daily.   Yes Historical Provider, MD  pseudoephedrine (SUDAFED) 30 MG tablet Take 60 mg by mouth at bedtime as needed.     Yes Historical Provider, MD  rosuvastatin (CRESTOR) 40 MG tablet Take 1 tablet (40 mg total) by mouth daily. 06/05/14  Yes Tammi Sou, MD  sildenafil (VIAGRA) 50 MG tablet Take 50 mg by mouth daily as needed.     Yes Historical Provider, MD  tamsulosin (FLOMAX) 0.4 MG CAPS capsule Take 2 capsules (0.8 mg total) by mouth at bedtime. 07/02/14  Yes Tammi Sou, MD  vitamin B-12 (CYANOCOBALAMIN) 1000 MCG tablet Take 1,000 mcg by mouth daily.     Yes Historical Provider, MD  zaleplon (SONATA) 10 MG capsule Take 1 capsule (10 mg total) by mouth at bedtime as needed. 03/30/13  Yes Tammi Sou, MD  zoster vaccine live, PF, (ZOSTAVAX) 06301 UNT/0.65ML injection Inject 19,400 Units into the skin once. 02/13/13  Yes Tammi Sou, MD  gentamicin (GARAMYCIN) 0.3 % ophthalmic solution Place 2 drops into the left eye every 4 (four) hours. X 7 days 11/22/14   Jacqulyn Cane, MD   BP 126/82 mmHg  Pulse 90  Temp(Src) 98 F (36.7 C) (Oral)  Ht 5\' 10"  (1.778 m)  Wt 265 lb (120.203 kg)  BMI 38.02 kg/m2  SpO2 98% Physical Exam  Constitutional: He is oriented to person, place, and time. He appears well-developed and well-nourished. No distress.  HENT:  Head: Normocephalic and atraumatic.  Eyes: EOM are normal. Pupils are equal, round, and reactive to light. Lids are everted and swept, no foreign bodies found. Right eye exhibits no  exudate. No foreign body present in the right eye. Left eye exhibits no exudate. No foreign body present in the left eye. Right conjunctiva is not injected. Right conjunctiva has no hemorrhage. Left conjunctiva is injected. Left conjunctiva has no hemorrhage. No scleral icterus.  Slit lamp exam:      The left eye shows corneal abrasion (with mild stippling of left cornea. No corneal ulcer).  Neck: Normal range of motion.  Cardiovascular: Normal rate.   Pulmonary/Chest: Effort  normal.  Abdominal: He exhibits no distension.  Musculoskeletal: Normal range of motion.  Neurological: He is alert and oriented to person, place, and time.  Skin: Skin is warm.  Psychiatric: He has a normal mood and affect.  Nursing note and vitals reviewed.  Visual acuity each eye grossly intact bilaterally ED Course  Procedures (including critical care time) Labs Review Labs Reviewed - No data to display  Imaging Review No results found.   MDM   1. Corneal abrasion, left, initial encounter    Treatment options discussed, as well as risks, benefits, alternatives. Patient voiced understanding and agreement with the following plans: Prior to left eye exam, 2 drops of tetracaine eyedrops, which significantly relieved his left eye discomfort. After the physical exam, gentamicin ointment placed left eye Double patch left eye Advised to keep the patch on for 24 hours, then remove, then start gentamicin eyedrops as prescribed Follow-up with eye doctor in 2-3 days or sooner if worse or new symptoms Patient and wife voiced understanding and agreement    Jacqulyn Cane, MD 11/22/14 1427

## 2014-11-22 NOTE — ED Notes (Signed)
Patient c/o left eye irritation, burning and redness. Patient accidentally got the contact cleaning solution in his left eye this morning.

## 2014-11-27 ENCOUNTER — Other Ambulatory Visit: Payer: Self-pay | Admitting: Family Medicine

## 2014-11-27 MED ORDER — ROSUVASTATIN CALCIUM 40 MG PO TABS: 40.0000 mg | ORAL_TABLET | Freq: Every day | ORAL | Status: DC

## 2014-11-27 MED ORDER — METFORMIN HCL 500 MG PO TABS: ORAL_TABLET | ORAL | Status: DC

## 2014-11-27 MED ORDER — LISINOPRIL 20 MG PO TABS: ORAL_TABLET | ORAL | Status: DC

## 2014-12-19 ENCOUNTER — Other Ambulatory Visit: Payer: Self-pay | Admitting: Family Medicine

## 2014-12-28 ENCOUNTER — Other Ambulatory Visit: Payer: Self-pay | Admitting: Family Medicine

## 2014-12-28 MED ORDER — TAMSULOSIN HCL 0.4 MG PO CAPS: 0.8000 mg | ORAL_CAPSULE | Freq: Every day | ORAL | Status: DC

## 2015-01-16 ENCOUNTER — Other Ambulatory Visit: Payer: Self-pay | Admitting: Family Medicine

## 2015-01-16 MED ORDER — ESCITALOPRAM OXALATE 10 MG PO TABS: ORAL_TABLET | ORAL | Status: DC

## 2015-01-23 ENCOUNTER — Other Ambulatory Visit: Payer: Self-pay | Admitting: Family Medicine

## 2015-01-23 MED ORDER — FINASTERIDE 5 MG PO TABS: ORAL_TABLET | ORAL | Status: DC

## 2015-02-28 ENCOUNTER — Encounter: Payer: Self-pay | Admitting: Family Medicine

## 2015-02-28 ENCOUNTER — Ambulatory Visit (INDEPENDENT_AMBULATORY_CARE_PROVIDER_SITE_OTHER): Payer: BLUE CROSS/BLUE SHIELD | Admitting: Family Medicine

## 2015-02-28 VITALS — BP 102/64 | HR 88 | Temp 98.0°F | Ht 70.0 in | Wt 264.0 lb

## 2015-02-28 DIAGNOSIS — M7632 Iliotibial band syndrome, left leg: Secondary | ICD-10-CM | POA: Diagnosis not present

## 2015-02-28 DIAGNOSIS — S8392XA Sprain of unspecified site of left knee, initial encounter: Secondary | ICD-10-CM

## 2015-02-28 NOTE — Patient Instructions (Signed)
Take 2 OTC alleve tabs with food twice a day for 7d

## 2015-02-28 NOTE — Progress Notes (Signed)
Pre visit review using our clinic review tool, if applicable. No additional management support is needed unless otherwise documented below in the visit note. 

## 2015-02-28 NOTE — Progress Notes (Signed)
OFFICE NOTE  02/28/2015  CC:  Chief Complaint  Patient presents with  . Knee Injury   HPI: Patient is a 64 y.o. Caucasian male who is here for left knee pain. Felt a snap in lateral aspect/back of left knee yesterday evening while playing in yard with his dog.  Recalls no abnormal movement preceding this, no trauma. Could not bear wt initially, began to feel pain with certain movements and with wt bearing, also feels like he has occ feeling of "giving way" or buckling.  Tried ice and rest and compression bandage, took 2 alleve last night.Roney Jaffe he has noted no knee swelling.  No discoloration.  Knee has improved since the incident.  Pertinent PMH:  Past medical, surgical, social, and family history reviewed and no changes are noted since last office visit.  MEDS:  Outpatient Prescriptions Prior to Visit  Medication Sig Dispense Refill  . aspirin 325 MG tablet Take 325 mg by mouth daily.      . Choline Fenofibrate (FENOFIBRIC ACID) 135 MG CPDR take 1 capsule by mouth once daily 90 capsule 1  . clindamycin (CLEOCIN) 150 MG capsule Take 150 mg by mouth 3 (three) times daily.    [name] Dextromethorphan-Guaifenesin (MUCINEX DM MAXIMUM STRENGTH) 60-1200 MG per 12 hr tablet Take 2 tablets by mouth at bedtime as needed.      [name] escitalopram (LEXAPRO) 10 MG tablet take 1 tablet by mouth once daily 90 tablet 1  . finasteride (PROSCAR) 5 MG tablet take 1 tablet by mouth once daily 90 tablet 1  . gentamicin (GARAMYCIN) 0.3 % ophthalmic solution Place 2 drops into the left eye every 4 (four) hours. X 7 days 5 mL 0  . glucose blood (ONE TOUCH ULTRA TEST) test strip Check glucose three times per day 100 each 12  . HYDROcodone-acetaminophen (NORCO/VICODIN) 5-325 MG per tablet 1-2 tabs q6h prn 30 tablet 0  . Insulin Detemir (LEVEMIR FLEXTOUCH) 100 UNIT/ML Pen 30 U SQ qhs 15 mL 11  . Insulin Pen Needle (NOVOFINE) 32G X 6 MM MISC Use for insulin injection 4 times per day 100 each 12  . lisinopril  (PRINIVIL,ZESTRIL) 20 MG tablet take 1 tablet by mouth once daily 90 tablet 0  . metFORMIN (GLUCOPHAGE) 500 MG tablet take 2 tablets by mouth twice a day with food 360 tablet 0  . Naproxen Sodium (ALEVE) 220 MG CAPS Take 2 capsules by mouth at bedtime as needed.      . Omega-3 Fatty Acids (FISH OIL) 1000 MG CAPS Take 1 capsule by mouth 2 (two) times daily.    . pseudoephedrine (SUDAFED) 30 MG tablet Take 60 mg by mouth at bedtime as needed.      . rosuvastatin (CRESTOR) 40 MG tablet Take 1 tablet (40 mg total) by mouth daily. 90 tablet 0  . sildenafil (VIAGRA) 50 MG tablet Take 50 mg by mouth daily as needed.      . tamsulosin (FLOMAX) 0.4 MG CAPS capsule Take 2 capsules (0.8 mg total) by mouth at bedtime. 180 capsule 1  . vitamin B-12 (CYANOCOBALAMIN) 1000 MCG tablet Take 1,000 mcg by mouth daily.      . zaleplon (SONATA) 10 MG capsule Take 1 capsule (10 mg total) by mouth at bedtime as needed. 12 capsule 5  . zoster vaccine live, PF, (ZOSTAVAX) 61950 UNT/0.65ML injection Inject 19,400 Units into the skin once. 1 vial 0  . insulin lispro (HUMALOG) 100 UNIT/ML injection 5 U SQ qAC (Patient not taking: Reported on  02/28/2015) 15 mL 11   No facility-administered medications prior to visit.    PE: Blood pressure 102/64, pulse 88, temperature 98 F (36.7 C), temperature source Oral, height 5\' 10"  (1.778 m), weight 264 lb (119.75 kg), SpO2 94 %. Gen: Alert, well appearing.  Patient is oriented to person, place, time, and situation. Left knee: no swelling/effusion, no warmth, no erythema.  ROM fully intact, with some lateral discomfort noted with passive flexion/extension when pt lies supine.   Patellar grind neg.  No patellar subluxation.  Entire knee is nontender except for distal aspect of IT band starting just superior to the joint line and extending to a bit inferior to joint line.   The joint line itself is not tender laterally or medially.  No instability or pain with stability testing of the  AC/PC and collateral ligaments of the knee.  No palpable mass or tenderness in popliteal fossa. No lower leg edema.  IMPRESSION AND PLAN:  Left knee pain, acute. It sounds like he acutely sprained/injured his IT band.  I think the pop he heard must have been the band making a snapping noise as it moved over the lateral femoral epicondyle. No sign of other acute knee injury. Recommended he continue with relative rest, ice, alleve 440mg  bid with food x 7d, and I fitted him with/dispensed a knee sleeve support to wear over the next several days. Signs/symptoms to call or return for were reviewed and pt expressed understanding.   An After Visit Summary was printed and given to the patient.  FOLLOW UP: 7-10d if not improving.

## 2015-03-05 ENCOUNTER — Encounter: Payer: Self-pay | Admitting: Family Medicine

## 2015-03-05 ENCOUNTER — Telehealth: Payer: Self-pay | Admitting: Family Medicine

## 2015-03-05 ENCOUNTER — Ambulatory Visit (INDEPENDENT_AMBULATORY_CARE_PROVIDER_SITE_OTHER): Payer: BLUE CROSS/BLUE SHIELD | Admitting: Family Medicine

## 2015-03-05 VITALS — BP 118/64 | HR 85 | Temp 98.4°F | Ht 70.0 in | Wt 266.0 lb

## 2015-03-05 DIAGNOSIS — E119 Type 2 diabetes mellitus without complications: Secondary | ICD-10-CM

## 2015-03-05 DIAGNOSIS — E785 Hyperlipidemia, unspecified: Secondary | ICD-10-CM

## 2015-03-05 DIAGNOSIS — I10 Essential (primary) hypertension: Secondary | ICD-10-CM

## 2015-03-05 LAB — COMPREHENSIVE METABOLIC PANEL
ALT: 20 U/L (ref 0–53)
AST: 16 U/L (ref 0–37)
Albumin: 4.2 g/dL (ref 3.5–5.2)
Alkaline Phosphatase: 30 U/L — ABNORMAL LOW (ref 39–117)
BUN: 24 mg/dL — ABNORMAL HIGH (ref 6–23)
CO2: 24 mEq/L (ref 19–32)
Calcium: 9.4 mg/dL (ref 8.4–10.5)
Chloride: 103 mEq/L (ref 96–112)
Creatinine, Ser: 0.83 mg/dL (ref 0.40–1.50)
GFR: 99.71 mL/min (ref >60.00)
Glucose, Bld: 181 mg/dL — ABNORMAL HIGH (ref 70–99)
Potassium: 4.1 mEq/L (ref 3.5–5.1)
Sodium: 135 mEq/L (ref 135–145)
Total Bilirubin: 0.4 mg/dL (ref 0.2–1.2)
Total Protein: 6.5 g/dL (ref 6.0–8.3)

## 2015-03-05 LAB — LIPID PANEL
Cholesterol: 156 mg/dL (ref 0–200)
HDL: 41.9 mg/dL (ref >39.00)
NonHDL: 114.1
Total CHOL/HDL Ratio: 4
Triglycerides: 208 mg/dL — ABNORMAL HIGH (ref 0.0–149.0)
VLDL: 41.6 mg/dL — ABNORMAL HIGH (ref 0.0–40.0)

## 2015-03-05 LAB — HEMOGLOBIN A1C: Hgb A1c MFr Bld: 8.1 % — ABNORMAL HIGH (ref 4.6–6.5)

## 2015-03-05 LAB — LDL CHOLESTEROL, DIRECT: Direct LDL: 86 mg/dL

## 2015-03-05 NOTE — Progress Notes (Signed)
OFFICE NOTE  03/05/2015  CC:  Chief Complaint  Patient presents with  . Medication Refill   HPI: Patient is a 64 y.o. Caucasian male who is here for 8 mo f/u DM 2, HTN, hyperlipidemia. Is not monitoring gluc much, forgets/busy/can't get into routine.  Recalls some numbers: 150s. Has not started using mealtime insulin yet-says he can't remember. He is eating better he says.  Takes 30 U levemir qhs.  Taking metformin as rx'd.  He is taking crestor 40mg  qd.  No side effects felt.   Takes lisinopril daily, no home bp monitoring.  He says he feels good.  Recent knee pain is slightly improved.   Pertinent PMH:  Past medical, surgical, social, and family history reviewed and no changes are noted since last office visit.  MEDS:  Outpatient Prescriptions Prior to Visit  Medication Sig Dispense Refill  . aspirin 325 MG tablet Take 325 mg by mouth daily.      . Choline Fenofibrate (FENOFIBRIC ACID) 135 MG CPDR take 1 capsule by mouth once daily 90 capsule 1  . clindamycin (CLEOCIN) 150 MG capsule Take 150 mg by mouth 3 (three) times daily.    [name] Dextromethorphan-Guaifenesin (MUCINEX DM MAXIMUM STRENGTH) 60-1200 MG per 12 hr tablet Take 2 tablets by mouth at bedtime as needed.      [name] escitalopram (LEXAPRO) 10 MG tablet take 1 tablet by mouth once daily 90 tablet 1  . finasteride (PROSCAR) 5 MG tablet take 1 tablet by mouth once daily 90 tablet 1  . gentamicin (GARAMYCIN) 0.3 % ophthalmic solution Place 2 drops into the left eye every 4 (four) hours. X 7 days 5 mL 0  . glucose blood (ONE TOUCH ULTRA TEST) test strip Check glucose three times per day 100 each 12  . HYDROcodone-acetaminophen (NORCO/VICODIN) 5-325 MG per tablet 1-2 tabs q6h prn 30 tablet 0  . Insulin Detemir (LEVEMIR FLEXTOUCH) 100 UNIT/ML Pen 30 U SQ qhs 15 mL 11  . Insulin Pen Needle (NOVOFINE) 32G X 6 MM MISC Use for insulin injection 4 times per day 100 each 12  . lisinopril (PRINIVIL,ZESTRIL) 20 MG tablet take 1 tablet by  mouth once daily 90 tablet 0  . metFORMIN (GLUCOPHAGE) 500 MG tablet take 2 tablets by mouth twice a day with food 360 tablet 0  . Naproxen Sodium (ALEVE) 220 MG CAPS Take 2 capsules by mouth at bedtime as needed.      . Omega-3 Fatty Acids (FISH OIL) 1000 MG CAPS Take 1 capsule by mouth 2 (two) times daily.    . pseudoephedrine (SUDAFED) 30 MG tablet Take 60 mg by mouth at bedtime as needed.      . rosuvastatin (CRESTOR) 40 MG tablet Take 1 tablet (40 mg total) by mouth daily. 90 tablet 0  . sildenafil (VIAGRA) 50 MG tablet Take 50 mg by mouth daily as needed.      . tamsulosin (FLOMAX) 0.4 MG CAPS capsule Take 2 capsules (0.8 mg total) by mouth at bedtime. 180 capsule 1  . vitamin B-12 (CYANOCOBALAMIN) 1000 MCG tablet Take 1,000 mcg by mouth daily.      . zaleplon (SONATA) 10 MG capsule Take 1 capsule (10 mg total) by mouth at bedtime as needed. 12 capsule 5  . zoster vaccine live, PF, (ZOSTAVAX) 75102 UNT/0.65ML injection Inject 19,400 Units into the skin once. 1 vial 0  . insulin lispro (HUMALOG) 100 UNIT/ML injection 5 U SQ qAC (Patient not taking: Reported on 03/05/2015) 15 mL 11  No facility-administered medications prior to visit.    PE: Blood pressure 118/64, pulse 85, temperature 98.4 F (36.9 C), temperature source Oral, height 5\' 10"  (1.778 m), weight 266 lb (120.657 kg), SpO2 95 %. Gen: Alert, well appearing.  Patient is oriented to person, place, time, and situation. AFFECT: pleasant, lucid thought and speech.   LABS:  Lab Results  Component Value Date   HGBA1C 10.9* 05/21/2014    Lab Results  Component Value Date   TSH 1.46 05/21/2014   Lab Results  Component Value Date   WBC 6.2 05/21/2014   HGB 13.0 05/21/2014   HCT 39.1 05/21/2014   MCV 87.8 05/21/2014   PLT 211.0 05/21/2014   Lab Results  Component Value Date   CREATININE 0.9 05/21/2014   BUN 16 05/21/2014   NA 136 05/21/2014   K 4.3 05/21/2014   CL 98 05/21/2014   CO2 29 05/21/2014   Lab Results   Component Value Date   ALT 25 05/21/2014   AST 18 05/21/2014   ALKPHOS 40 05/21/2014   BILITOT 0.5 05/21/2014   Lab Results  Component Value Date   CHOL 180 05/21/2014   Lab Results  Component Value Date   HDL 42.10 05/21/2014   Lab Results  Component Value Date   LDLCALC 37 05/21/2014   Lab Results  Component Value Date   TRIG 503.0* 05/21/2014   Lab Results  Component Value Date   CHOLHDL 4 05/21/2014   Lab Results  Component Value Date   PSA 0.02* 05/21/2014   PSA 0.03* 05/10/2013   PSA 0.12 07/09/2011    IMPRESSION AND PLAN:  1) DM 2, poor control.  Compliance with monitoring and with mealtime insulin is practically nonexistent. He can only give excuses like "I forget" or "I just can't get in a routine".  He reports feeling very well fortunately. HbA1c today. Eye exam UTD (08/2014). Will do foot exam at next f/u visit.  2) HTN; The current medical regimen is effective;  continue present plan and medications. Lytes/cr today.  3) Hyperlipidemia, mixed: The current medical regimen is effective;  continue present plan and medications. FLP and AST/ALT today.  4) Recent left knee sprain/IT band injury: improving slowly.  Continue knee sleave, modify activity.  An After Visit Summary was printed and given to the patient.  FOLLOW UP: 7mo

## 2015-03-05 NOTE — Progress Notes (Signed)
Pre visit review using our clinic review tool, if applicable. No additional management support is needed unless otherwise documented below in the visit note. 

## 2015-03-05 NOTE — Telephone Encounter (Signed)
emmi mailed  °

## 2015-03-08 ENCOUNTER — Other Ambulatory Visit: Payer: Self-pay | Admitting: *Deleted

## 2015-03-08 MED ORDER — LISINOPRIL 20 MG PO TABS: ORAL_TABLET | ORAL | Status: DC

## 2015-03-08 MED ORDER — METFORMIN HCL 500 MG PO TABS: ORAL_TABLET | ORAL | Status: DC

## 2015-03-15 ENCOUNTER — Other Ambulatory Visit: Payer: Self-pay

## 2015-03-15 MED ORDER — FENOFIBRIC ACID 135 MG PO CPDR: DELAYED_RELEASE_CAPSULE | ORAL | Status: DC

## 2015-03-15 NOTE — Telephone Encounter (Signed)
Fenofibric Acid DR 135MG  refilled per [facility] Protocol

## 2015-03-18 ENCOUNTER — Other Ambulatory Visit: Payer: Self-pay | Admitting: Family Medicine

## 2015-03-18 MED ORDER — ROSUVASTATIN CALCIUM 40 MG PO TABS: 40.0000 mg | ORAL_TABLET | Freq: Every day | ORAL | Status: DC

## 2015-03-28 ENCOUNTER — Encounter: Payer: Self-pay | Admitting: Family Medicine

## 2015-04-03 ENCOUNTER — Ambulatory Visit (INDEPENDENT_AMBULATORY_CARE_PROVIDER_SITE_OTHER): Payer: BLUE CROSS/BLUE SHIELD | Admitting: Family Medicine

## 2015-04-03 ENCOUNTER — Encounter: Payer: Self-pay | Admitting: Family Medicine

## 2015-04-03 VITALS — BP 108/73 | HR 82 | Temp 98.6°F | Resp 16 | Wt 263.0 lb

## 2015-04-03 DIAGNOSIS — E785 Hyperlipidemia, unspecified: Secondary | ICD-10-CM | POA: Diagnosis not present

## 2015-04-03 DIAGNOSIS — M25562 Pain in left knee: Secondary | ICD-10-CM

## 2015-04-03 DIAGNOSIS — M25561 Pain in right knee: Secondary | ICD-10-CM | POA: Diagnosis not present

## 2015-04-03 MED ORDER — ATORVASTATIN CALCIUM 80 MG PO TABS: 80.0000 mg | ORAL_TABLET | Freq: Every day | ORAL | Status: DC

## 2015-04-03 NOTE — Patient Instructions (Signed)
Take your naproxen once every evening with food for the next 5 days, then stop.

## 2015-04-03 NOTE — Progress Notes (Signed)
OFFICE NOTE  04/03/2015  CC:  Chief Complaint  Patient presents with  . Knee Pain    right knee x 7-10 days no known injury   HPI: Patient is a 64 y.o. Caucasian male who is here for right knee pain. Onset about 10d ago, located in back, sides, and front aspect of knee, commonly intermittent and intense (8/10) but progressively getting worse each day--until last 24h-- he says it now feels [facility] better.  Had some occ feeling of unsteadiness, like it was going to give way at times.  Flexion past 90 deg made it worse as did wt bearing, also felt swollen but he did not notice it looking swollen.  Compression sleave helped a little.  No injury prior to pain but there is likelihood that he had altered his gait due to having a L knee problem about 1 mo ago (? IT band syndrome, gradually improving).  No redness or excessive warmth. He has been taking naproxen one dose a day since onset.  He has never had knee x-rays.  He tells me that his insurer will no longer cover crestor. He has hx of intolerance to simvastatin but does not recall ever having any problem with generic lipitor. I will go ahead and switch him from crestor to atorvastatin.  Pertinent PMH:  Past medical, surgical, social, and family history reviewed and no changes are noted since last office visit.  MEDS:  Outpatient Prescriptions Prior to Visit  Medication Sig Dispense Refill  . aspirin 325 MG tablet Take 325 mg by mouth daily.      . Choline Fenofibrate (FENOFIBRIC ACID) 135 MG CPDR take 1 capsule by mouth once daily 90 capsule 1  . Dextromethorphan-Guaifenesin (MUCINEX DM MAXIMUM STRENGTH) 60-1200 MG per 12 hr tablet Take 2 tablets by mouth at bedtime as needed.      [name] escitalopram (LEXAPRO) 10 MG tablet take 1 tablet by mouth once daily 90 tablet 1  . finasteride (PROSCAR) 5 MG tablet take 1 tablet by mouth once daily 90 tablet 1  . glucose blood (ONE TOUCH ULTRA TEST) test strip Check glucose three times per day 100 each 12   . HYDROcodone-acetaminophen (NORCO/VICODIN) 5-325 MG per tablet 1-2 tabs q6h prn 30 tablet 0  . Insulin Detemir (LEVEMIR FLEXTOUCH) 100 UNIT/ML Pen 30 U SQ qhs 15 mL 11  . insulin lispro (HUMALOG) 100 UNIT/ML injection 5 U SQ qAC 15 mL 11  . Insulin Pen Needle (NOVOFINE) 32G X 6 MM MISC Use for insulin injection 4 times per day 100 each 12  . lisinopril (PRINIVIL,ZESTRIL) 20 MG tablet take 1 tablet by mouth once daily 90 tablet 1  . metFORMIN (GLUCOPHAGE) 500 MG tablet take 2 tablets by mouth twice a day with food 360 tablet 1  . Naproxen Sodium (ALEVE) 220 MG CAPS Take 2 capsules by mouth at bedtime as needed.      . Omega-3 Fatty Acids (FISH OIL) 1000 MG CAPS Take 1 capsule by mouth 2 (two) times daily.    . pseudoephedrine (SUDAFED) 30 MG tablet Take 60 mg by mouth at bedtime as needed.      . rosuvastatin (CRESTOR) 40 MG tablet Take 1 tablet (40 mg total) by mouth daily. 90 tablet 0  . tamsulosin (FLOMAX) 0.4 MG CAPS capsule Take 2 capsules (0.8 mg total) by mouth at bedtime. 180 capsule 1  . vitamin B-12 (CYANOCOBALAMIN) 1000 MCG tablet Take 1,000 mcg by mouth daily.      . clindamycin (CLEOCIN) 150  MG capsule Take 150 mg by mouth 3 (three) times daily.    [name] gentamicin (GARAMYCIN) 0.3 % ophthalmic solution Place 2 drops into the left eye every 4 (four) hours. X 7 days (Patient not taking: Reported on 04/03/2015) 5 mL 0  . sildenafil (VIAGRA) 50 MG tablet Take 50 mg by mouth daily as needed.      . zaleplon (SONATA) 10 MG capsule Take 1 capsule (10 mg total) by mouth at bedtime as needed. (Patient not taking: Reported on 04/03/2015) 12 capsule 5  . zoster vaccine live, PF, (ZOSTAVAX) 45809 UNT/0.65ML injection Inject 19,400 Units into the skin once. 1 vial 0   No facility-administered medications prior to visit.    PE: Blood pressure 108/73, pulse 82, temperature 98.6 F (37 C), temperature source Temporal, resp. rate 16, weight 263 lb (119.296 kg), SpO2 94 %. Gen: Alert, well appearing,  obese-appearing WM in NAD.  Patient is oriented to person, place, time, and situation. Knees: no erythema, warmth, swelling, or tenderness of either knee.  No effusion. Right knee has mild pain at the maximum point of flexion, otherwise ROM intact w/out stiffness or pain.  L knee ROm fully intact and no instability.   Right knee with neg patellar grind, no instability, and negative McMurray's testing.    LABS: none today Lab Results  Component Value Date   CHOL 156 03/05/2015   HDL 41.90 03/05/2015   LDLCALC 37 05/21/2014   LDLDIRECT 86.0 03/05/2015   TRIG 208.0* 03/05/2015   CHOLHDL 4 03/05/2015   .lastchem  IMPRESSION AND PLAN:  1) Right knee arthralgia, likely a reactive strain from altered gait caused by recent left knee pain. Left knee pain is essentially resolved now. Right knee pain appears to have resolved now w/in the last 24h or so.  Still just a bit of pain at full flexion and mild subjective feeling of "fullness".   I recommended he take naproxen 440 mg qd as he has been doing for the last 5d--continue 5 more days and then stop.  Take with food.   Check plain films of both knees to see if any underlying osteoarthritic changes are present. Signs/symptoms to call or return for were reviewed and pt expressed understanding.  2) Hyperlipidemia; due to insurance formulary rules I will change him from crestor 40mg  qd to atorvastatin 80 mg qd.  Transaminases normal 03/2015.  Lipid panel good 03/2015.  An After Visit Summary was printed and given to the patient.  FOLLOW UP: prn

## 2015-04-03 NOTE — Progress Notes (Signed)
Pre visit review using our clinic review tool, if applicable. No additional management support is needed unless otherwise documented below in the visit note. 

## 2015-04-05 ENCOUNTER — Encounter: Payer: Self-pay | Admitting: Family Medicine

## 2015-04-05 ENCOUNTER — Ambulatory Visit (HOSPITAL_BASED_OUTPATIENT_CLINIC_OR_DEPARTMENT_OTHER)
Admission: RE | Admit: 2015-04-05 | Discharge: 2015-04-05 | Disposition: A | Discharge status: Home / Self Care | Payer: BLUE CROSS/BLUE SHIELD | Source: Ambulatory Visit | Attending: Family Medicine | Admitting: Family Medicine

## 2015-04-05 DIAGNOSIS — M25561 Pain in right knee: Secondary | ICD-10-CM | POA: Diagnosis present

## 2015-04-05 DIAGNOSIS — M25562 Pain in left knee: Secondary | ICD-10-CM | POA: Insufficient documentation

## 2015-04-05 DIAGNOSIS — I739 Peripheral vascular disease, unspecified: Secondary | ICD-10-CM | POA: Insufficient documentation

## 2015-04-05 DIAGNOSIS — M2341 Loose body in knee, right knee: Secondary | ICD-10-CM | POA: Insufficient documentation

## 2015-04-05 IMAGING — DX DG KNEE COMPLETE 4+V*R*
5 series · 5 of 5 positions shown · non-contrast
Comparison: None

CLINICAL DATA: BILATERAL knee pain for 2 weeks, no known injury

EXAM:
RIGHT KNEE - COMPLETE 4+ VIEW

[knee lat (1 of 2)]
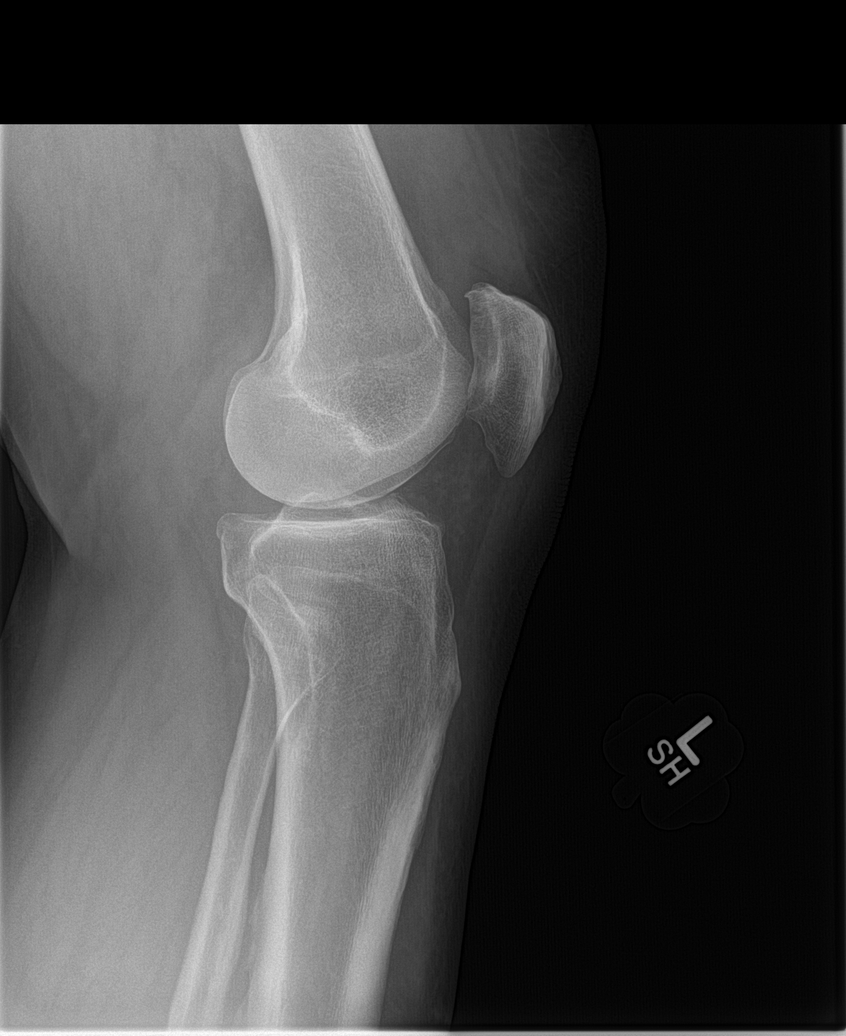

[knee sunrise]
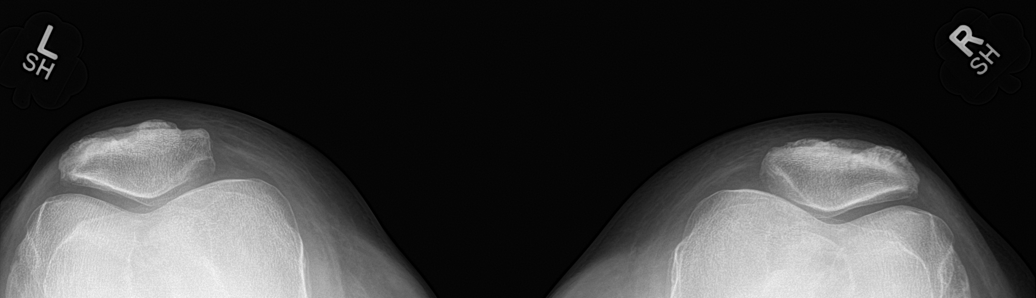

[knee ap bilat standing (1 of 2)]
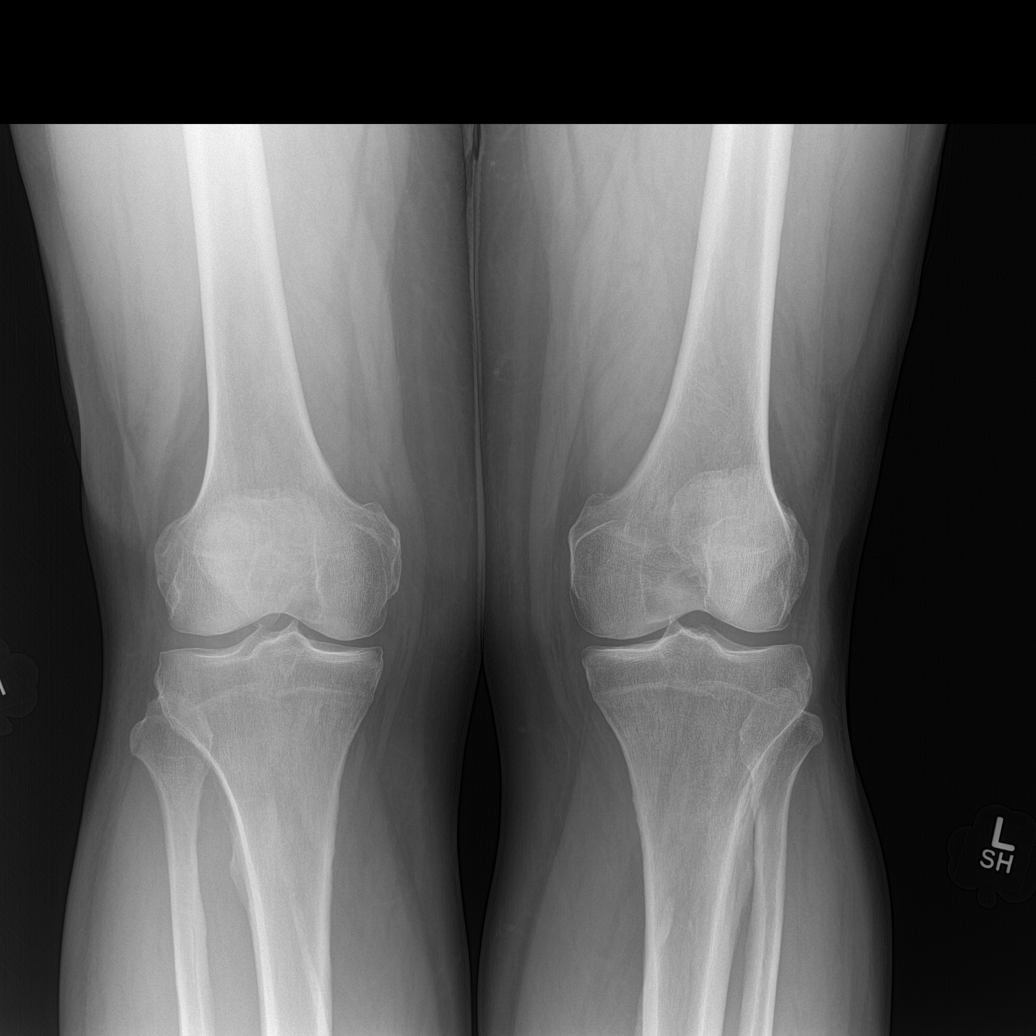

[knee ap bilat standing (2 of 2)]
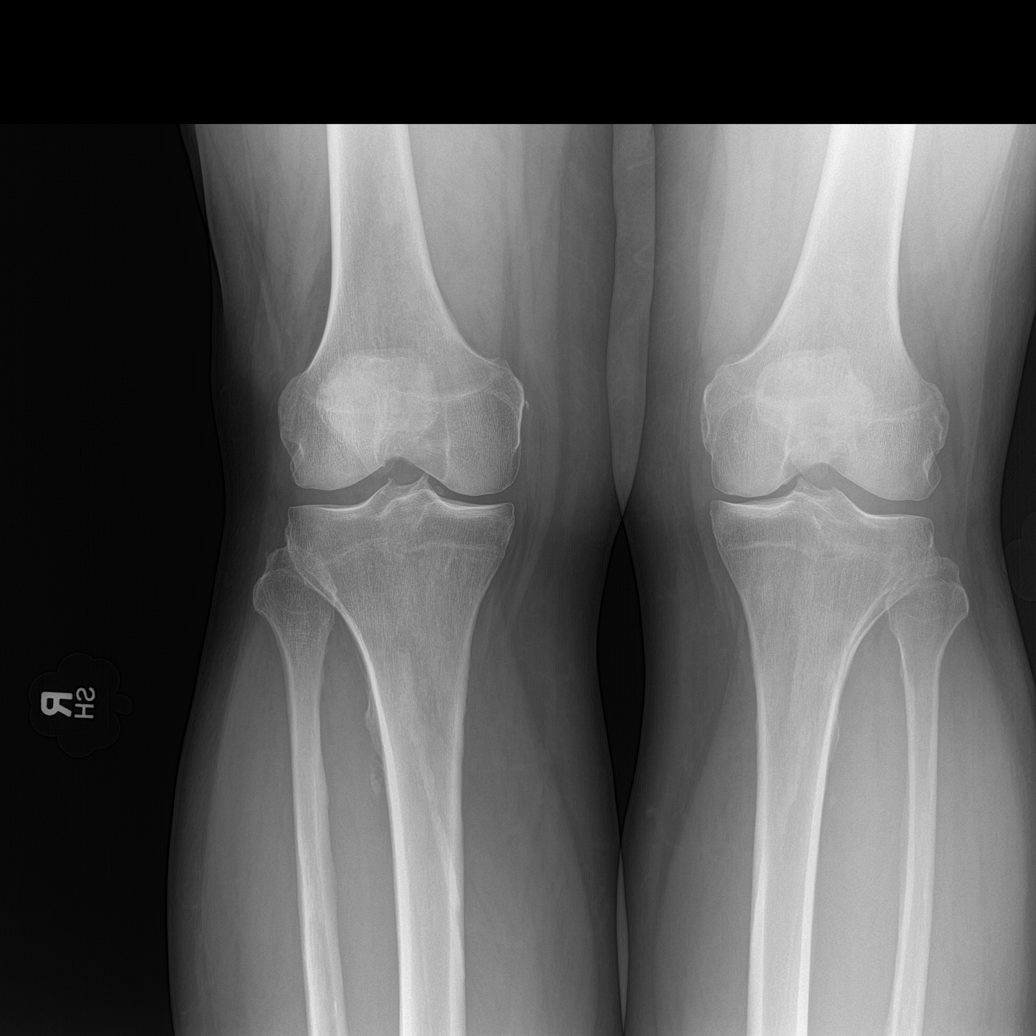

[knee lat (2 of 2)]
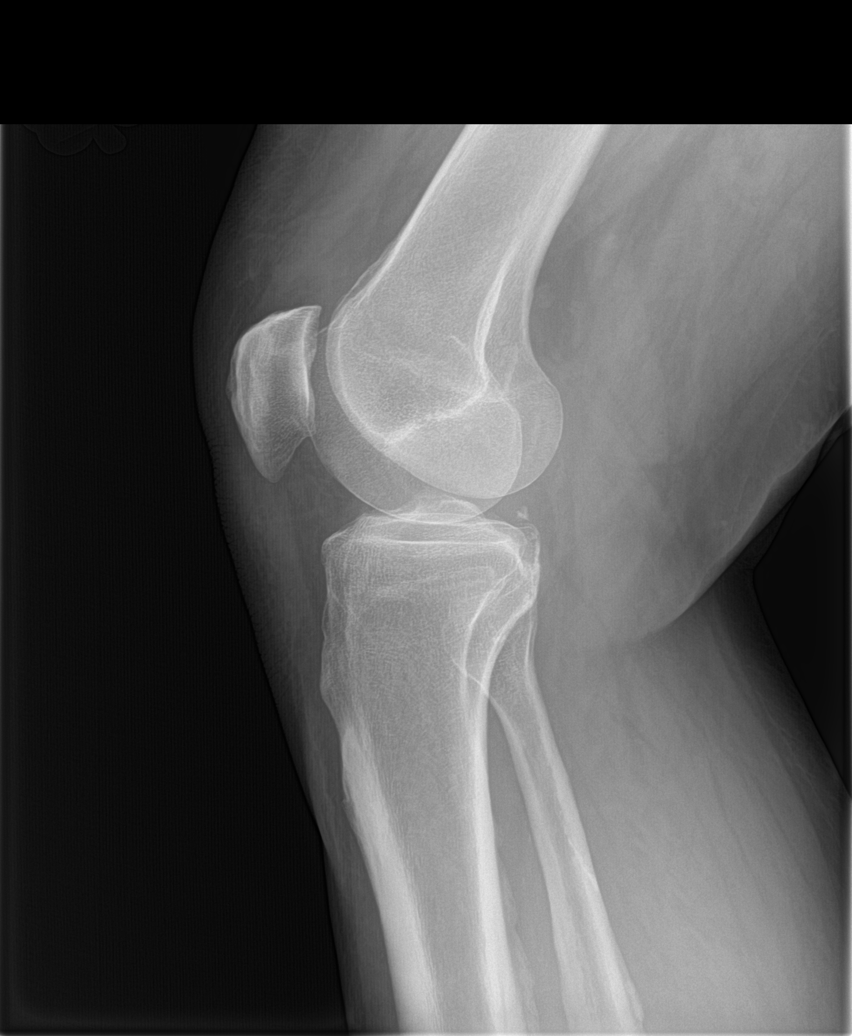

[5 of 5 positions shown; findings below may reference images not displayed]

FINDINGS: Bones appear mildly demineralized.

Minimal joint space narrowing.

No acute fracture, dislocation or bone destruction.

No knee joint effusion.
IMPRESSION: Minimal degenerative changes without acute abnormalities.

## 2015-04-26 ENCOUNTER — Encounter: Payer: Self-pay | Admitting: Nurse Practitioner

## 2015-04-26 ENCOUNTER — Ambulatory Visit (INDEPENDENT_AMBULATORY_CARE_PROVIDER_SITE_OTHER): Payer: BLUE CROSS/BLUE SHIELD | Admitting: Nurse Practitioner

## 2015-04-26 VITALS — BP 110/69 | HR 108 | Temp 98.5°F | Ht 70.0 in | Wt 266.0 lb

## 2015-04-26 DIAGNOSIS — J069 Acute upper respiratory infection, unspecified: Secondary | ICD-10-CM | POA: Diagnosis not present

## 2015-04-26 NOTE — Progress Notes (Signed)
Pre visit review using our clinic review tool, if applicable. No additional management support is needed unless otherwise documented below in the visit note. 

## 2015-04-26 NOTE — Patient Instructions (Signed)
You have a virus causing your symptoms. The average duration of cold symptoms is 14 days.  For nasal congestion: Start daily sinus rinses (neilmed Sinus Rinse). Use daily for 7 to 10 days. You may take coricidin for nasal congestion. Also, you may use Vicks vapor rub under nose to help breathe. For cough: Use delsym at night. Buy sugar free. For aches headache: Tylenol  Sip fluids every hour. Rest. If you are not feeling better in 10 days or develop fever or chest pain, call us for re-evaluation. Feel better!  Upper Respiratory Infection, Adult An upper respiratory infection (URI) is also sometimes known as the common cold. The upper respiratory tract includes the nose, sinuses, throat, trachea, and bronchi. Bronchi are the airways leading to the lungs. Most people improve within 1 week, but symptoms can last up to 2 weeks. A residual cough may last even longer.  CAUSES Many different viruses can infect the tissues lining the upper respiratory tract. The tissues become irritated and inflamed and often become very moist. Mucus production is also common. A cold is contagious. You can easily spread the virus to others by oral contact. This includes kissing, sharing a glass, coughing, or sneezing. Touching your mouth or nose and then touching a surface, which is then touched by another person, can also spread the virus. SYMPTOMS  Symptoms typically develop 1 to 3 days after you come in contact with a cold virus. Symptoms vary from person to person. They may include:  Runny nose.  Sneezing.  Nasal congestion.  Sinus irritation.  Sore throat.  Loss of voice (laryngitis).  Cough.  Fatigue.  Muscle aches.  Loss of appetite.  Headache.  Low-grade fever. DIAGNOSIS  You might diagnose your own cold based on familiar symptoms, since most people get a cold 2 to 3 times a year. Your caregiver can confirm this based on your exam. Most importantly, your caregiver can check that your symptoms  are not due to another disease such as strep throat, sinusitis, pneumonia, asthma, or epiglottitis. Blood tests, throat tests, and X-rays are not necessary to diagnose a common cold, but they may sometimes be helpful in excluding other more serious diseases. Your caregiver will decide if any further tests are required. RISKS AND COMPLICATIONS  You may be at risk for a more severe case of the common cold if you smoke cigarettes, have chronic heart disease (such as heart failure) or lung disease (such as asthma), or if you have a weakened immune system. The very young and very old are also at risk for more serious infections. Bacterial sinusitis, middle ear infections, and bacterial pneumonia can complicate the common cold. The common cold can worsen asthma and chronic obstructive pulmonary disease (COPD). Sometimes, these complications can require emergency medical care and may be life-threatening. PREVENTION  The best way to protect against getting a cold is to practice good hygiene. Avoid oral or hand contact with people with cold symptoms. Wash your hands often if contact occurs. There is no clear evidence that vitamin C, vitamin E, echinacea, or exercise reduces the chance of developing a cold. However, it is always recommended to get plenty of rest and practice good nutrition. TREATMENT  Treatment is directed at relieving symptoms. There is no cure. Antibiotics are not effective, because the infection is caused by a virus, not by bacteria. Treatment may include:  Increased fluid intake. Sports drinks offer valuable electrolytes, sugars, and fluids.  Breathing heated mist or steam (vaporizer or shower).  Eating chicken  soup or other clear broths, and maintaining good nutrition.  Getting plenty of rest.  Using gargles or lozenges for comfort.  Controlling fevers with ibuprofen or acetaminophen as directed by your caregiver.  Increasing usage of your inhaler if you have asthma. Zinc gel and  zinc lozenges, taken in the first 24 hours of the common cold, can shorten the duration and lessen the severity of symptoms. Pain medicines may help with fever, muscle aches, and throat pain. A variety of non-prescription medicines are available to treat congestion and runny nose. Your caregiver can make recommendations and may suggest nasal or lung inhalers for other symptoms.  HOME CARE INSTRUCTIONS   Only take over-the-counter or prescription medicines for pain, discomfort, or fever as directed by your caregiver.  Use a warm mist humidifier or inhale steam from a shower to increase air moisture. This may keep secretions moist and make it easier to breathe.  Drink enough water and fluids to keep your urine clear or pale yellow.  Rest as needed.  Return to work when your temperature has returned to normal or as your caregiver advises. You may need to stay home longer to avoid infecting others. You can also use a face mask and careful hand washing to prevent spread of the virus. SEEK MEDICAL CARE IF:   After the first few days, you feel you are getting worse rather than better.  You need your caregiver's advice about medicines to control symptoms.  You develop chills, worsening shortness of breath, or brown or red sputum. These may be signs of pneumonia.  You develop yellow or brown nasal discharge or pain in the face, especially when you bend forward. These may be signs of sinusitis.  You develop a fever, swollen neck glands, pain with swallowing, or white areas in the back of your throat. These may be signs of strep throat. SEEK IMMEDIATE MEDICAL CARE IF:   You have a fever.  You develop severe or persistent headache, ear pain, sinus pain, or chest pain.  You develop wheezing, a prolonged cough, cough up blood, or have a change in your usual mucus (if you have chronic lung disease).  You develop sore muscles or a stiff neck. Document Released: 05/12/2001 Document Revised: 02/08/2012  Document Reviewed: 03/20/2011 [facility] Patient Information 2014 [place], [place].

## 2015-04-26 NOTE — Progress Notes (Signed)
   Subjective:    Patient ID: [name] [name], male    DOB: [birth year], 64 y.o.   MRN: [id number]  HPI Comments: Wife has been sick w/similar symptoms for several days.  URI  This is a new problem. The current episode started yesterday. There has been no fever (chills last night). Associated symptoms include congestion, coughing and sinus pain. Pertinent negatives include no abdominal pain, chest pain, diarrhea, ear pain, headaches, nausea, plugged ear sensation, sneezing, sore throat, vomiting or wheezing. Associated symptoms comments: Feels SOB . Treatments tried: mucinex, tylenol. The treatment provided mild relief.      Review of Systems  HENT: Positive for congestion. Negative for ear pain, sneezing and sore throat.   Respiratory: Positive for cough. Negative for wheezing.   Cardiovascular: Negative for chest pain.  Gastrointestinal: Negative for nausea, vomiting, abdominal pain and diarrhea.  Neurological: Negative for headaches.       Objective:   Physical Exam  Constitutional: He is oriented to person, place, and time. He appears well-developed and well-nourished. He appears distressed.  Looks tired  HENT:  Head: Normocephalic and atraumatic.  Right Ear: External ear normal.  Left Ear: External ear normal.  Mouth/Throat: Oropharynx is clear and moist. No oropharyngeal exudate.  Eyes: Conjunctivae are normal. Right eye exhibits no discharge. Left eye exhibits no discharge.  Neck: Normal range of motion. Neck supple. No thyromegaly present.  Cardiovascular: Normal rate, regular rhythm and normal heart sounds.   No murmur heard. Pulmonary/Chest: Effort normal and breath sounds normal.  Lymphadenopathy:    He has no cervical adenopathy.  Neurological: He is alert and oriented to person, place, and time.  Skin: Skin is warm and dry. No rash noted.  Psychiatric: He has a normal mood and affect. His behavior is normal. Thought content normal.  Vitals reviewed.        Assessment & Plan:  1. Acute upper respiratory infection Symptom management. See pt instructions F/u PRN

## 2015-05-06 ENCOUNTER — Ambulatory Visit (INDEPENDENT_AMBULATORY_CARE_PROVIDER_SITE_OTHER): Payer: BLUE CROSS/BLUE SHIELD | Admitting: Family Medicine

## 2015-05-06 ENCOUNTER — Encounter: Payer: Self-pay | Admitting: Family Medicine

## 2015-05-06 VITALS — BP 106/72 | HR 92 | Temp 98.1°F | Resp 16 | Wt 265.0 lb

## 2015-05-06 DIAGNOSIS — M25561 Pain in right knee: Secondary | ICD-10-CM

## 2015-05-06 MED ORDER — METHYLPREDNISOLONE ACETATE 40 MG/ML IJ SUSP
40.0000 mg | Freq: Once | INTRAMUSCULAR | Status: AC
  Administered 2015-05-06: 40 mg via INTRA_ARTICULAR

## 2015-05-06 NOTE — Progress Notes (Signed)
OFFICE NOTE  05/06/2015  CC:  Chief Complaint  Patient presents with  . Knee Pain    right knee x 6 days, no fall but possible injury from walking     HPI: Patient is a 64 y.o. Caucasian male who is here for right knee pain. About 1 week of R knee pain, onset when he stepped oddly on a brick.  Felt severe pain, "snap" sensation on both sides of knee, says he could not walk much at all for several days, feels like the knee will give way, can't sleep at night due to the pain.  No pain unless he is wt bearing.  No discernable swelling, no redness.  It did feel swollen to the patient, though.  Taking 1000 mg tylenol bid and it helps just a bit.  Pertinent PMH:  Past medical, surgical, social, and family history reviewed and no changes are noted since last office visit.  MEDS:  Outpatient Prescriptions Prior to Visit  Medication Sig Dispense Refill  . aspirin 325 MG tablet Take 325 mg by mouth daily.      . Choline Fenofibrate (FENOFIBRIC ACID) 135 MG CPDR take 1 capsule by mouth once daily 90 capsule 1  . CRESTOR 40 MG tablet   0  . Dextromethorphan-Guaifenesin (MUCINEX DM MAXIMUM STRENGTH) 60-1200 MG per 12 hr tablet Take 2 tablets by mouth at bedtime as needed.      [name] escitalopram (LEXAPRO) 10 MG tablet take 1 tablet by mouth once daily 90 tablet 1  . finasteride (PROSCAR) 5 MG tablet take 1 tablet by mouth once daily 90 tablet 1  . glucose blood (ONE TOUCH ULTRA TEST) test strip Check glucose three times per day 100 each 12  . Insulin Detemir (LEVEMIR FLEXTOUCH) 100 UNIT/ML Pen 30 U SQ qhs 15 mL 11  . Insulin Pen Needle (NOVOFINE) 32G X 6 MM MISC Use for insulin injection 4 times per day 100 each 12  . lisinopril (PRINIVIL,ZESTRIL) 20 MG tablet take 1 tablet by mouth once daily 90 tablet 1  . metFORMIN (GLUCOPHAGE) 500 MG tablet take 2 tablets by mouth twice a day with food 360 tablet 1  . Omega-3 Fatty Acids (FISH OIL) 1000 MG CAPS Take 1 capsule by mouth 2 (two) times daily.    .  tamsulosin (FLOMAX) 0.4 MG CAPS capsule Take 2 capsules (0.8 mg total) by mouth at bedtime. 180 capsule 1  . vitamin B-12 (CYANOCOBALAMIN) 1000 MCG tablet Take 1,000 mcg by mouth daily.      . zaleplon (SONATA) 10 MG capsule Take 1 capsule (10 mg total) by mouth at bedtime as needed. 12 capsule 5  . atorvastatin (LIPITOR) 80 MG tablet Take 1 tablet (80 mg total) by mouth daily. (Patient not taking: Reported on 05/06/2015) 30 tablet 3  . clindamycin (CLEOCIN) 150 MG capsule Take 150 mg by mouth 3 (three) times daily.    [name] gentamicin (GARAMYCIN) 0.3 % ophthalmic solution Place 2 drops into the left eye every 4 (four) hours. X 7 days (Patient not taking: Reported on 05/06/2015) 5 mL 0  . HYDROcodone-acetaminophen (NORCO/VICODIN) 5-325 MG per tablet 1-2 tabs q6h prn (Patient not taking: Reported on 05/06/2015) 30 tablet 0  . insulin lispro (HUMALOG) 100 UNIT/ML injection 5 U SQ qAC (Patient not taking: Reported on 05/06/2015) 15 mL 11  . Naproxen Sodium (ALEVE) 220 MG CAPS Take 2 capsules by mouth at bedtime as needed.      . pseudoephedrine (SUDAFED) 30 MG tablet Take 60 mg  by mouth at bedtime as needed.      . sildenafil (VIAGRA) 50 MG tablet Take 50 mg by mouth daily as needed.      . zoster vaccine live, PF, (ZOSTAVAX) 16010 UNT/0.65ML injection Inject 19,400 Units into the skin once. (Patient not taking: Reported on 04/26/2015) 1 vial 0   No facility-administered medications prior to visit.    PE: Blood pressure 106/72, pulse 92, temperature 98.1 F (36.7 C), temperature source Oral, resp. rate 16, weight 265 lb (120.203 kg), SpO2 94 %. Gen: Alert, well appearing.  Patient is oriented to person, place, time, and situation. Right knee w/out erythema, swelling, or warmth.  Mild TTp on lateral aspects of knee but not clearly just joint line tenderness and not clearly just MCL or LCL pain.  He does have some pes anserine bursa TTP.  Passive extension does not elicit pain in knee but active extension elicits  knee pain at about 170 deg.  Flexion elicits knee pain only at the extreme. No knee instability.  Neg patellar grind.  No crepitus.  Patella w/out subluxation.  IMPRESSION AND PLAN:  Right knee pain, ?intra-articular vs extra-articular? Will do diagnostic/therapeutic injection in right knee joint today. If not improved in 3-4 d then will refer to sports medicine. Continue tylenol for pain.   Procedure: Therapeutic knee injection.  The patient's clinical condition is marked by substantial pain and/or significant functional disability.  Other conservative therapy has not provided relief, is contraindicated, or not appropriate.  There is a reasonable likelihood that injection will significantly improve the patient's pain and/or functional disability. Cleaned skin with alcohol swab, used anterior approach to enter knee joint, Injected 1 ml of 40 mg/ml depo-medrol + 27ml 1% plain lidocaine into joint space without resistance.  No immediate complications.  Patient tolerated procedure well.  Post-injection care discussed, including 20 min of icing 1-2 times in the next 4-8 hours, frequent non weight-bearing ROM exercises over the next few days, and general pain medication management.   An After Visit Summary was printed and given to the patient.  FOLLOW UP: call if not signif improved in 4d.

## 2015-05-06 NOTE — Progress Notes (Signed)
Pre visit review using our clinic review tool, if applicable. No additional management support is needed unless otherwise documented below in the visit note. 

## 2015-05-20 ENCOUNTER — Other Ambulatory Visit: Payer: Self-pay | Admitting: *Deleted

## 2015-05-20 MED ORDER — CRESTOR 40 MG PO TABS: 40.0000 mg | ORAL_TABLET | Freq: Every day | ORAL | Status: DC

## 2015-05-20 NOTE — Telephone Encounter (Signed)
RF request for Crestor.  LOV: 03/05/15 Next ov: 07/04/15 Last written: Unknown

## 2015-06-24 ENCOUNTER — Other Ambulatory Visit: Payer: Self-pay | Admitting: *Deleted

## 2015-06-24 MED ORDER — TAMSULOSIN HCL 0.4 MG PO CAPS: 0.8000 mg | ORAL_CAPSULE | Freq: Every day | ORAL | Status: DC

## 2015-06-24 NOTE — Telephone Encounter (Signed)
RF request for tamsulosin. LOV: 05/06/15 Next ov: 07/04/15 Last written: 12/28/14 #180 w/ 1RF

## 2015-07-04 ENCOUNTER — Encounter: Payer: Self-pay | Admitting: Family Medicine

## 2015-07-04 ENCOUNTER — Ambulatory Visit (INDEPENDENT_AMBULATORY_CARE_PROVIDER_SITE_OTHER): Payer: BLUE CROSS/BLUE SHIELD | Admitting: Family Medicine

## 2015-07-04 VITALS — BP 122/76 | HR 93 | Temp 98.5°F | Resp 16 | Ht 70.0 in | Wt 273.0 lb

## 2015-07-04 DIAGNOSIS — Z125 Encounter for screening for malignant neoplasm of prostate: Secondary | ICD-10-CM | POA: Diagnosis not present

## 2015-07-04 DIAGNOSIS — E785 Hyperlipidemia, unspecified: Secondary | ICD-10-CM | POA: Diagnosis not present

## 2015-07-04 DIAGNOSIS — E119 Type 2 diabetes mellitus without complications: Secondary | ICD-10-CM | POA: Diagnosis not present

## 2015-07-04 DIAGNOSIS — I10 Essential (primary) hypertension: Secondary | ICD-10-CM

## 2015-07-04 LAB — HEMOGLOBIN A1C: Hgb A1c MFr Bld: 8.7 % — ABNORMAL HIGH (ref 4.6–6.5)

## 2015-07-04 LAB — MICROALBUMIN / CREATININE URINE RATIO
Creatinine,U: 203.2 mg/dL
Microalb Creat Ratio: 0.3 mg/g (ref 0.0–30.0)
Microalb, Ur: <0.7 mg/dL (ref 0.0–1.9)

## 2015-07-04 LAB — PSA: PSA: 0.03 ng/mL — ABNORMAL LOW (ref 0.10–4.00)

## 2015-07-04 NOTE — Progress Notes (Signed)
OFFICE VISIT  07/04/2015   CC:  Chief Complaint  Patient presents with  . Follow-up    4 month f/u. Pt is fasting.    HPI:    Patient is a 64 y.o. Caucasian male who presents for 4 mo f/u DM 2, HTN, hyperlipidemia. Last visit I recommended once again that he start mealtime humalog:  Glucoses occ checked and he sees 170-200.  Says he is eating "pretty well".  No exercise. Tolerating lipitor well. Compliant with lisinopril daily.    Feet: no neuropathic pain.  Some numbness on big toe of L foot.   No hx of foot ulcer.  Past Medical History  Diagnosis Date  . History of scarlet fever     age 60  . 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 Keratoconus     dx'd 1973  . Depression     Hospitalized for suicidal ideation 55.  Has been on lexapro since 10/2008.  [name] Hyperlipidemia, mixed 1993  . Hypertension 2009    "marked chronic cardiomegaly" on CXR 01/2009 per old records.  . Diabetes mellitus type II     Dx'd approximately 06/2008  . OSA (obstructive sleep apnea) 2004    intolerant of CPAP  . History of pneumonia 2009    Hospitalized  . DDD (degenerative disc disease), lumbar 2010    laminectomy 01/2009  . BPH (benign prostatic hypertrophy)   . Insomnia   . Rhinitis, chronic   . Erectile dysfunction   . 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 History of colon polyps   . Osteoarthritis of both knees     Mild plain film changes in medial compartment bilat    Past Surgical History  Procedure Laterality Date  . Appendectomy  1972  . Vasectomy  1994  . 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 Tonsillectomy and adenoidectomy      age 78  . Urethral dilation      2 as a child, one as an adult  . Colonoscopy  X 3    Most recent was 2005, with removal of polyps in the first two.  [name] Electrocardiogram  01/29/2009    NORMAL  . Lumbar laminectomy  01/2009    Outpatient Prescriptions Prior to Visit   Medication Sig Dispense Refill  . aspirin 325 MG tablet Take 325 mg by mouth daily.      [name] atorvastatin (LIPITOR) 80 MG tablet Take 1 tablet (80 mg total) by mouth daily. 30 tablet 3  . Choline Fenofibrate (FENOFIBRIC ACID) 135 MG CPDR take 1 capsule by mouth once daily 90 capsule 1  . escitalopram (LEXAPRO) 10 MG tablet take 1 tablet by mouth once daily 90 tablet 1  . finasteride (PROSCAR) 5 MG tablet take 1 tablet by mouth once daily 90 tablet 1  . glucose blood (ONE TOUCH ULTRA TEST) test strip Check glucose three times per day 100 each 12  . Insulin Detemir (LEVEMIR FLEXTOUCH) 100 UNIT/ML Pen 30 U SQ qhs 15 mL 11  . Insulin Pen Needle (NOVOFINE) 32G X 6 MM MISC Use for insulin injection 4 times per day 100 each 12  . lisinopril (PRINIVIL,ZESTRIL) 20 MG tablet take 1 tablet by mouth once daily 90 tablet 1  . metFORMIN (GLUCOPHAGE) 500 MG tablet take 2 tablets by mouth twice a day with food 360 tablet 1  . Naproxen  Sodium (ALEVE) 220 MG CAPS Take 2 capsules by mouth at bedtime as needed.      . Omega-3 Fatty Acids (FISH OIL) 1000 MG CAPS Take 1 capsule by mouth 2 (two) times daily.    . tamsulosin (FLOMAX) 0.4 MG CAPS capsule Take 2 capsules (0.8 mg total) by mouth at bedtime. 180 capsule 1  . vitamin B-12 (CYANOCOBALAMIN) 1000 MCG tablet Take 1,000 mcg by mouth daily.      . zaleplon (SONATA) 10 MG capsule Take 1 capsule (10 mg total) by mouth at bedtime as needed. 12 capsule 5  . insulin lispro (HUMALOG) 100 UNIT/ML injection 5 U SQ qAC (Patient not taking: Reported on 05/06/2015) 15 mL 11  . clindamycin (CLEOCIN) 150 MG capsule Take 150 mg by mouth 3 (three) times daily.    . CRESTOR 40 MG tablet Take 1 tablet (40 mg total) by mouth daily. (Patient not taking: Reported on 07/04/2015) 90 tablet 3  . Dextromethorphan-Guaifenesin (MUCINEX DM MAXIMUM STRENGTH) 60-1200 MG per 12 hr tablet Take 2 tablets by mouth at bedtime as needed.      [name] gentamicin (GARAMYCIN) 0.3 % ophthalmic solution Place 2  drops into the left eye every 4 (four) hours. X 7 days (Patient not taking: Reported on 05/06/2015) 5 mL 0  . HYDROcodone-acetaminophen (NORCO/VICODIN) 5-325 MG per tablet 1-2 tabs q6h prn (Patient not taking: Reported on 05/06/2015) 30 tablet 0  . pseudoephedrine (SUDAFED) 30 MG tablet Take 60 mg by mouth at bedtime as needed.      . sildenafil (VIAGRA) 50 MG tablet Take 50 mg by mouth daily as needed.      . zoster vaccine live, PF, (ZOSTAVAX) 76283 UNT/0.65ML injection Inject 19,400 Units into the skin once. (Patient not taking: Reported on 04/26/2015) 1 vial 0   No facility-administered medications prior to visit.    Allergies  Allergen Reactions  . Simvastatin Other (See Comments)    myalgias  . Antihistamines, Diphenhydramine-Type Other (See Comments)    depression  . Codeine Nausea Only  . Penicillins Hives  . Sulfa Antibiotics Hives    ROS As per HPI  PE: Blood pressure 122/76, pulse 93, temperature 98.5 F (36.9 C), temperature source Oral, resp. rate 16, height 5\' 10"  (1.778 m), weight 273 lb (123.832 kg), SpO2 94 %. Gen: Alert, well appearing.  Patient is oriented to person, place, time, and situation. Foot exam - both normal; no swelling, tenderness or skin or vascular lesions. Color and temperature is normal. Sensation is intact. Peripheral pulses are palpable. Toenails are normal. Rectal exam: negative without mass, lesions or tenderness, PROSTATE EXAM: smooth and symmetric without nodules or tenderness.    LABS:  Lab Results  Component Value Date   TSH 1.46 05/21/2014   Lab Results  Component Value Date   WBC 6.2 05/21/2014   HGB 13.0 05/21/2014   HCT 39.1 05/21/2014   MCV 87.8 05/21/2014   PLT 211.0 05/21/2014   Lab Results  Component Value Date   CREATININE 0.83 03/05/2015   BUN 24* 03/05/2015   NA 135 03/05/2015   K 4.1 03/05/2015   CL 103 03/05/2015   CO2 24 03/05/2015   Lab Results  Component Value Date   ALT 20 03/05/2015   AST 16 03/05/2015    ALKPHOS 30* 03/05/2015   BILITOT 0.4 03/05/2015   Lab Results  Component Value Date   CHOL 156 03/05/2015   Lab Results  Component Value Date   HDL 41.90 03/05/2015   Lab Results  Component Value Date   LDLCALC 37 05/21/2014   Lab Results  Component Value Date   TRIG 208.0* 03/05/2015   Lab Results  Component Value Date   CHOLHDL 4 03/05/2015   Lab Results  Component Value Date   PSA 0.02* 05/21/2014   PSA 0.03* 05/10/2013   PSA 0.12 07/09/2011   Lab Results  Component Value Date   HGBA1C 8.1* 03/05/2015   IMPRESSION AND PLAN:  1) DM 2, noncompliant with mealtime insulin once again. Discussed importance of this--went over exactly how to give this today and he says he'll try again. HbA1c today, urine microalb/cr today. Feet exam normal today.  2) HTN;The current medical regimen is effective;  continue present plan and medications.  3) Hyperlipidemia: tolerating atorvastatin.  Lipids good 03/2015.  AST/ALT normal 03/2015.  4) Prostate cancer screening: DRE normal today.  Obtain PSA today.  An After Visit Summary was printed and given to the patient.  FOLLOW UP: Return in about 3 months (around 10/04/2015) for routine chronic illness f/u.

## 2015-07-04 NOTE — Progress Notes (Signed)
Pre visit review using our clinic review tool, if applicable. No additional management support is needed unless otherwise documented below in the visit note. 

## 2015-07-11 ENCOUNTER — Other Ambulatory Visit: Payer: Self-pay | Admitting: *Deleted

## 2015-07-11 MED ORDER — INSULIN DETEMIR 100 UNIT/ML FLEXPEN: PEN_INJECTOR | SUBCUTANEOUS | Status: DC

## 2015-07-11 MED ORDER — ESCITALOPRAM OXALATE 10 MG PO TABS: ORAL_TABLET | ORAL | Status: DC

## 2015-07-11 NOTE — Telephone Encounter (Signed)
RF request for levemir LOV: 07/04/15 Next ov: 10/02/15 Last written: 05/28/14 35ml w/ 11RF

## 2015-07-11 NOTE — Telephone Encounter (Signed)
RF request for escitalopram LOV: 07/04/15 Next ov: 10/02/15 Last written: 01/16/15 #90 w/ 1RF

## 2015-08-09 ENCOUNTER — Other Ambulatory Visit: Payer: Self-pay | Admitting: *Deleted

## 2015-08-09 MED ORDER — FINASTERIDE 5 MG PO TABS: ORAL_TABLET | ORAL | Status: DC

## 2015-08-09 NOTE — Telephone Encounter (Signed)
RF request for finasteride LOV: 07/04/15 Next ov: 10/02/15 Last written: 01/23/15 #90 w/ 1RF

## 2015-08-22 ENCOUNTER — Encounter: Payer: Self-pay | Admitting: Family Medicine

## 2015-08-22 ENCOUNTER — Other Ambulatory Visit: Payer: Self-pay | Admitting: *Deleted

## 2015-08-22 MED ORDER — LISINOPRIL 20 MG PO TABS: ORAL_TABLET | ORAL | Status: DC

## 2015-08-22 MED ORDER — METFORMIN HCL 500 MG PO TABS: ORAL_TABLET | ORAL | Status: DC

## 2015-08-22 NOTE — Telephone Encounter (Signed)
RF request for lisinopril LOV: 07/04/15 Next ov: 10/02/15 Last written: 03/08/15 #90 w/ 1RF

## 2015-08-22 NOTE — Telephone Encounter (Signed)
RF request for metformin LOV: 07/04/15 Next ov: 10/02/15 Last written: 03/08/15 #360 w/1RF

## 2015-08-23 ENCOUNTER — Other Ambulatory Visit: Payer: Self-pay | Admitting: Family Medicine

## 2015-08-23 DIAGNOSIS — Z86010 Personal history of colonic polyps: Secondary | ICD-10-CM

## 2015-08-23 NOTE — Telephone Encounter (Signed)
OK, referral to GI has been ordered as per pt request.

## 2015-08-26 NOTE — Telephone Encounter (Signed)
Left detailed message on home vm, okay per DPR.  

## 2015-08-30 ENCOUNTER — Encounter: Payer: Self-pay | Admitting: Family Medicine

## 2015-09-03 ENCOUNTER — Encounter: Payer: Self-pay | Admitting: Gastroenterology

## 2015-09-24 ENCOUNTER — Other Ambulatory Visit: Payer: Self-pay | Admitting: *Deleted

## 2015-09-24 MED ORDER — ATORVASTATIN CALCIUM 80 MG PO TABS: 80.0000 mg | ORAL_TABLET | Freq: Every day | ORAL | Status: DC

## 2015-09-24 MED ORDER — FENOFIBRIC ACID 135 MG PO CPDR: DELAYED_RELEASE_CAPSULE | ORAL | Status: DC

## 2015-09-24 NOTE — Telephone Encounter (Signed)
LOV: 07/04/15 NOV: 10/02/15  RF request for fenofibric acid Last written: 03/05/15 #90 w/ 1RF  RF request for atorvastatin Last written: 04/03/15 #30 w/ 3RF

## 2015-10-02 ENCOUNTER — Ambulatory Visit: Payer: BLUE CROSS/BLUE SHIELD | Admitting: Family Medicine

## 2015-10-07 ENCOUNTER — Other Ambulatory Visit: Payer: Self-pay | Admitting: *Deleted

## 2015-10-07 ENCOUNTER — Encounter: Payer: Self-pay | Admitting: Family Medicine

## 2015-10-07 MED ORDER — GLUCOSE BLOOD VI STRP: ORAL_STRIP | Status: DC

## 2015-10-07 NOTE — Telephone Encounter (Signed)
RF request for test strips LOV: 07/04/15 Next ov: None Last written: 07/27/14 #100 w/ 12RF

## 2015-10-30 ENCOUNTER — Ambulatory Visit: Payer: BLUE CROSS/BLUE SHIELD | Admitting: Gastroenterology

## 2015-11-21 ENCOUNTER — Other Ambulatory Visit: Payer: Self-pay | Admitting: Family Medicine

## 2015-11-21 NOTE — Telephone Encounter (Signed)
RF request for Humalog LOV: 07/04/15 Next ov: None Last written: 07/27/14 71mL w/ 1

## 2015-12-17 ENCOUNTER — Ambulatory Visit (HOSPITAL_BASED_OUTPATIENT_CLINIC_OR_DEPARTMENT_OTHER)
Admission: RE | Admit: 2015-12-17 | Discharge: 2015-12-17 | Disposition: A | Discharge status: Home / Self Care | Payer: BLUE CROSS/BLUE SHIELD | Source: Ambulatory Visit | Attending: Family Medicine | Admitting: Family Medicine

## 2015-12-17 ENCOUNTER — Encounter: Payer: Self-pay | Admitting: Family Medicine

## 2015-12-17 ENCOUNTER — Ambulatory Visit (INDEPENDENT_AMBULATORY_CARE_PROVIDER_SITE_OTHER): Payer: BLUE CROSS/BLUE SHIELD | Admitting: Family Medicine

## 2015-12-17 ENCOUNTER — Telehealth: Payer: Self-pay | Admitting: Family Medicine

## 2015-12-17 VITALS — BP 139/86 | HR 84 | Temp 98.0°F | Resp 20 | Wt 277.0 lb

## 2015-12-17 DIAGNOSIS — R05 Cough: Secondary | ICD-10-CM

## 2015-12-17 DIAGNOSIS — R062 Wheezing: Secondary | ICD-10-CM | POA: Insufficient documentation

## 2015-12-17 DIAGNOSIS — R0989 Other specified symptoms and signs involving the circulatory and respiratory systems: Secondary | ICD-10-CM | POA: Diagnosis not present

## 2015-12-17 DIAGNOSIS — J0100 Acute maxillary sinusitis, unspecified: Secondary | ICD-10-CM

## 2015-12-17 DIAGNOSIS — R509 Fever, unspecified: Secondary | ICD-10-CM | POA: Diagnosis not present

## 2015-12-17 DIAGNOSIS — R059 Cough, unspecified: Secondary | ICD-10-CM

## 2015-12-17 IMAGING — DX DG CHEST 2V
2 series · 2 of 2 positions shown · non-contrast
Comparison: None.

CLINICAL DATA: Cough, congestion, fever, wheezing for 2 weeks.

EXAM:
CHEST  2 VIEW

[chest pa]
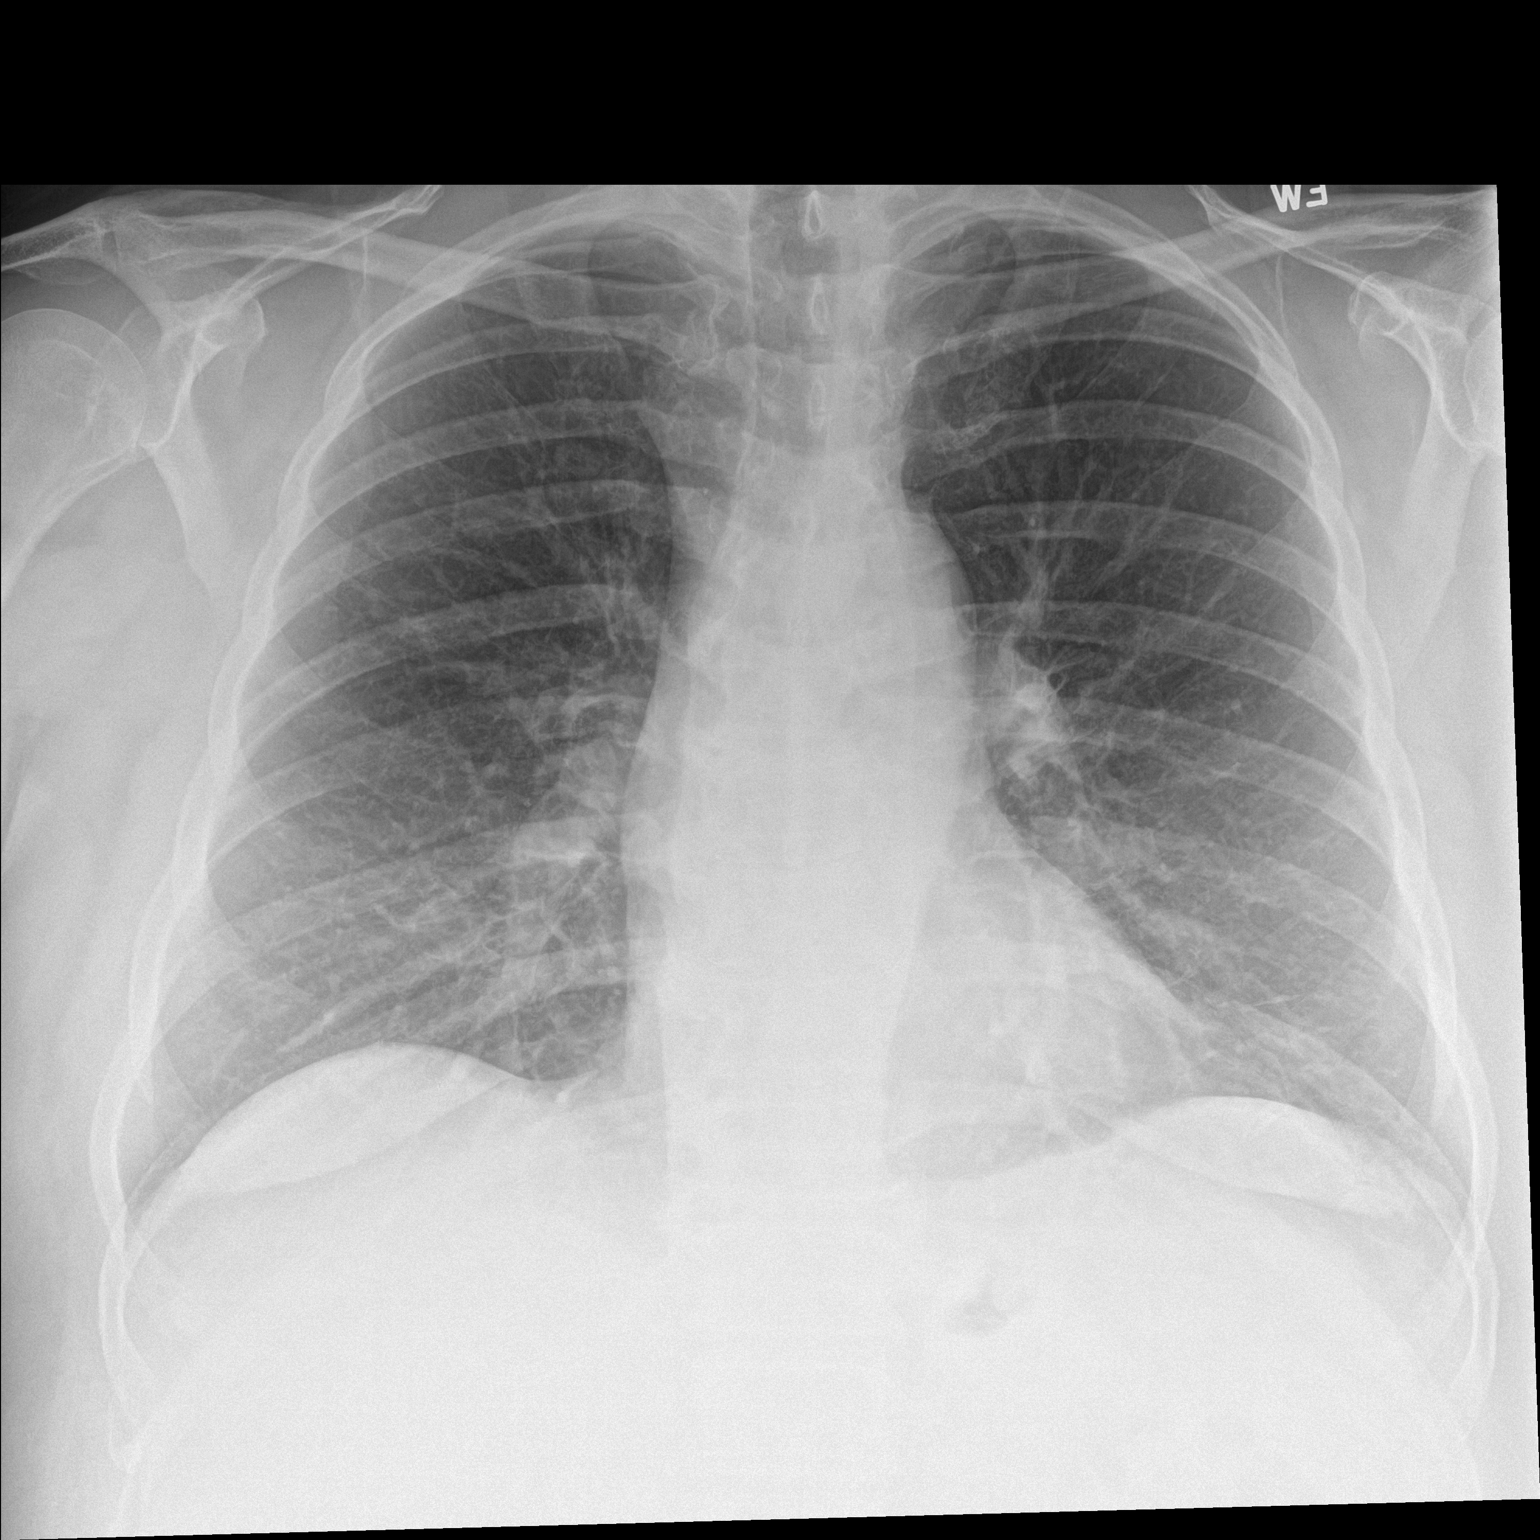

[chest lat]
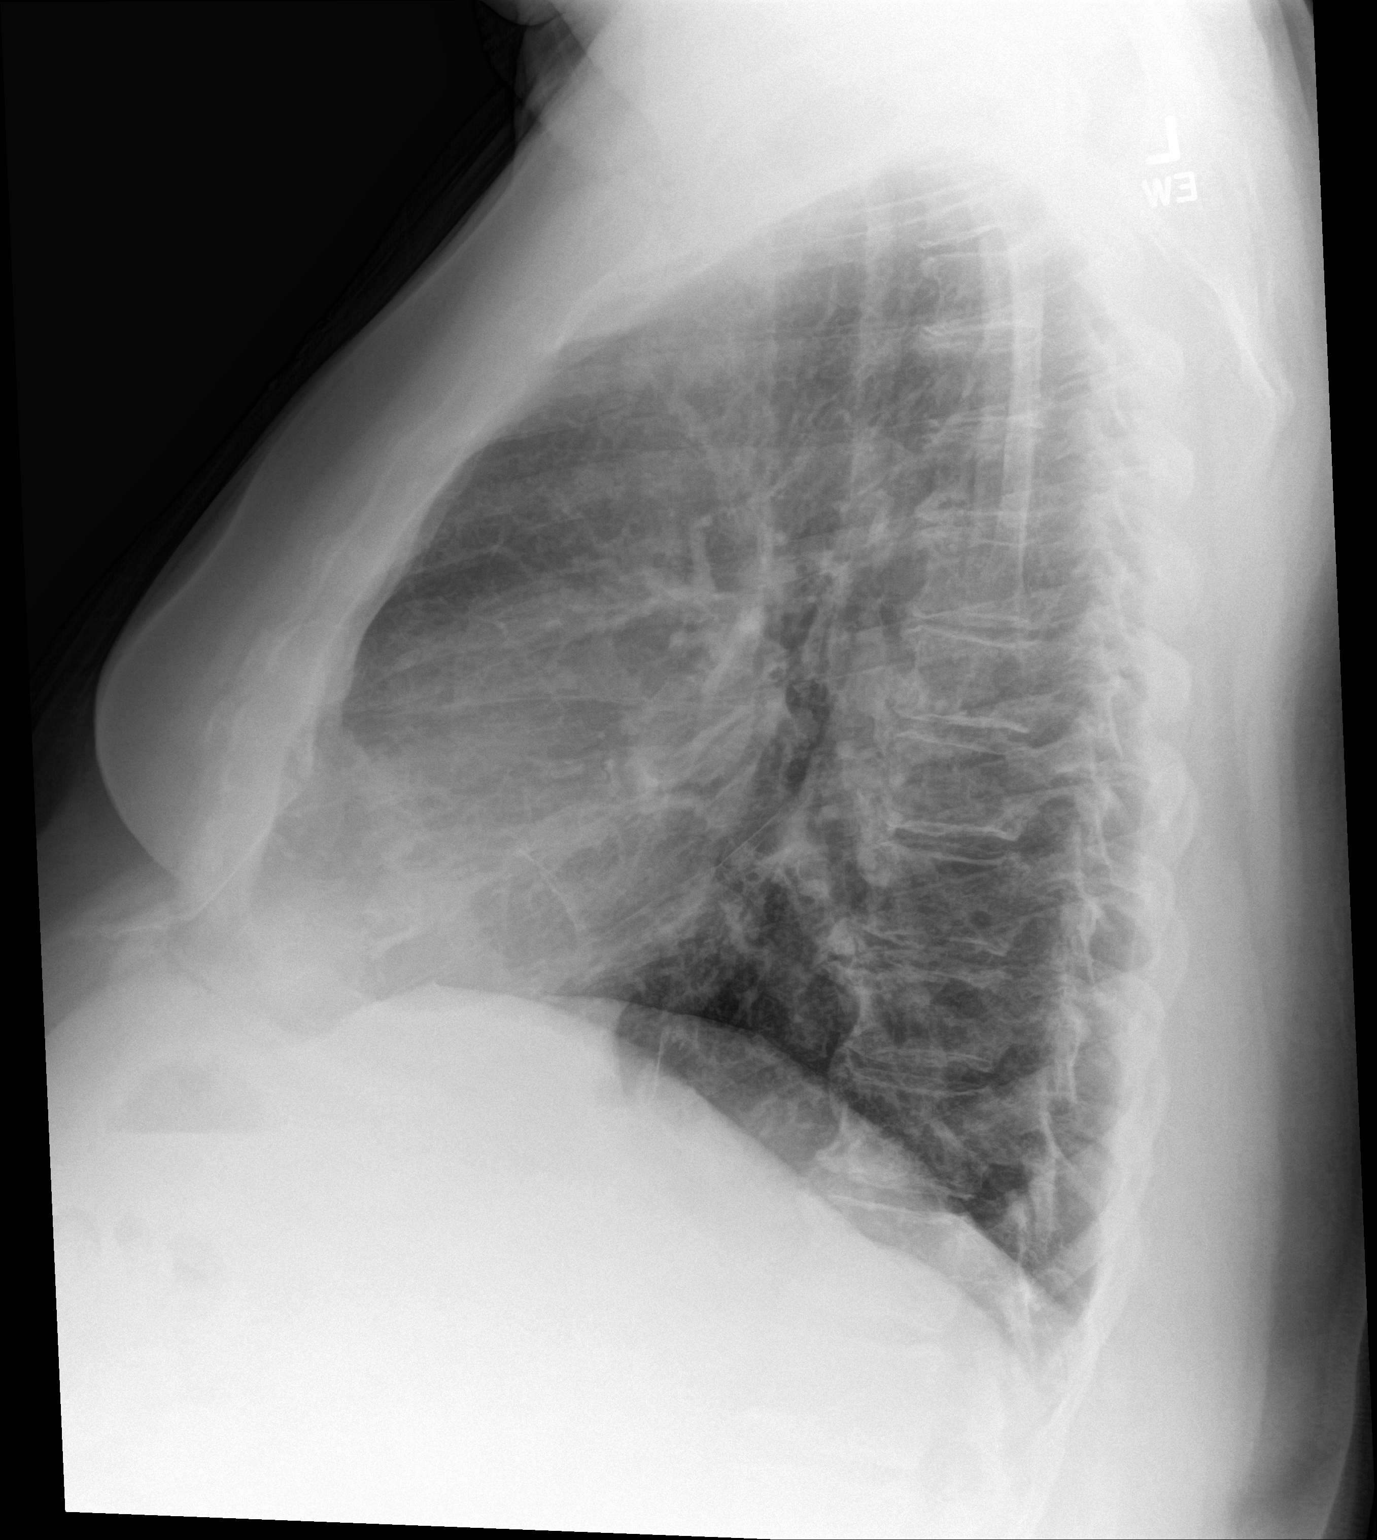

[2 of 2 positions shown; findings below may reference images not displayed]

FINDINGS: Mild peribronchial thickening. Heart and mediastinal contours are
within normal limits. No focal opacities or effusions. No acute bony
abnormality.
IMPRESSION: Mild bronchitic changes.

## 2015-12-17 MED ORDER — HYDROCODONE-HOMATROPINE 5-1.5 MG/5ML PO SYRP
5.0000 mL | ORAL_SOLUTION | Freq: Three times a day (TID) | ORAL | Status: DC | PRN
Start: 2015-12-17 — End: 2016-01-06

## 2015-12-17 MED ORDER — DOXYCYCLINE HYCLATE 100 MG PO TABS: 100.0000 mg | ORAL_TABLET | Freq: Two times a day (BID) | ORAL | Status: DC

## 2015-12-17 NOTE — Patient Instructions (Signed)

## 2015-12-17 NOTE — Telephone Encounter (Signed)
Spoke with patient reviewed xray results. 

## 2015-12-17 NOTE — Progress Notes (Signed)
Patient ID: [name] [name], male   DOB: [birth year], 65 y.o.   MRN: [id number]   Subjective:    Patient ID: [name] [name]     DOB: 11-02-1951, 65 y.o.    MRN: [id number]  HPI  Sinus pressure: Patient presents with a greater than 5 day history of nasal congestion, rhinorrhea, dry cough, chest congestion, burning with coughing in the chest, sneezing, wheezing, rib pain from coughing, decreased sleep secondary to cough and nausea. Patient states when he lays back he has postnasal drip, or requiring him to "gasp" for air. He states that he has had moments of feeling very warm and then chills. He has not taken his temperature. Patient denies diarrhea, endorses "vomit" that is just phlegm. Patient denies asthma, COPD, CHF history her lower extremity edema. 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 Patient is up-to-date with his vaccinations. Patient is a diabetic.  Past Medical History  Diagnosis Date  . History of scarlet fever     age 25  . 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 Keratoconus     dx'd 1973  . Depression     Hospitalized for suicidal ideation 10.  Has been on lexapro since 10/2008.  [name] Hyperlipidemia, mixed 1993    myalgias on pravastatin  . Hypertension 2009    "marked chronic cardiomegaly" on CXR 01/2009 per old records.  . Diabetes mellitus type II     Dx'd approximately 06/2008  . OSA (obstructive sleep apnea) 2004    intolerant of CPAP  . History of pneumonia 2008    Hospitalized  . DDD (degenerative disc disease), lumbar 2010    laminectomy 01/2009  . BPH (benign prostatic hypertrophy)   . Insomnia   . Rhinitis, chronic   . Erectile dysfunction   . 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 History of colon polyps   . Osteoarthritis of both knees     Mild plain film changes in medial compartment bilat   Allergies  Allergen Reactions  .  Simvastatin Other (See Comments)    myalgias  . Antihistamines, Diphenhydramine-Type Other (See Comments)    depression  . Codeine Nausea Only  . Penicillins Hives  . Sulfa Antibiotics Hives   Past Surgical History  Procedure Laterality Date  . Appendectomy  1972  . Vasectomy  1994  . 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 Tonsillectomy and adenoidectomy      age 44  . Urethral dilation      2 as a child, one as an adult  . Colonoscopy  X 3    Most recent was 2007 [place], [place]), with removal of polyps in the first two.  [name] Electrocardiogram  01/29/2009    NORMAL  . Lumbar laminectomy  01/2009   Social History  Substance Use Topics  . Smoking status: Never Smoker   . Smokeless tobacco: Never Used  . Alcohol Use: Yes     Comment: social, 3-4 drinks month    Review of Systems Negative, with the exception of above mentioned in HPI     Objective:   Physical Exam BP 139/86 mmHg  Pulse 84  Temp(Src) 98 F (36.7 C)  Resp 20  Wt 277 lb (125.646 kg)  SpO2 95% Body mass index is 39.75 kg/(m^2). Gen: Afebrile. No acute distress. Nontoxic in appearance. Well developed,  well nourished, pleasant Caucasian male, appears fatigued. HENT: AT. [place]. Bilateral TM visualized, fluid-filled/small area of dried blood right tympanic membrane without perforation. No erythema or bulging bilaterally. MMM, no oral lesions. Bilateral nares erythema, right greater than left nasal passage swelling. Throat without erythema, no exudates. Cough present, Hoarseness no, TTP maxillary sinus Eyes:Pupils Equal Round Reactive to light, Extraocular movements intact,  Conjunctiva without redness, discharge or icterus. Neck/lymp/endocrine: Supple, shotty anterior cervical lymphadenopathy CV: RRR Chest: CTAB, no wheeze or crackles. Normal respiratory effort, good air movement with cough on deep inspiration. Abd: Soft. NTND. BS present Skin: No rashes, purpura or petechiae.    Assessment & Plan:  [name] [name] is a 65 y.o. present for acute OV  Acute maxillary sinusitis, recurrence not specified/cough/fever - Patient encouraged to continue Nasonex, Nettie pot, humidifier, Robitussin/Mucinex. - Hycodan cough syrup prescribed for cough. Patient states his allergy to codeine was with large doses while in the hospital, nausea only. He would like to try the cough syrup to help him with sleep. - doxycycline (VIBRA-TABS) 100 MG tablet; Take 1 tablet (100 mg total) by mouth 2 (two) times daily.  Dispense: 20 tablet; Refill: 0 - DG Chest 2 View; Future Follow-up dependent if chest x-ray negative positive,. Follow-up in one week regardless if no improvement or worsening symptoms.

## 2015-12-17 NOTE — Telephone Encounter (Signed)
Please call pt: - His cxr did not show pneumonia.

## 2015-12-23 ENCOUNTER — Encounter: Payer: Self-pay | Admitting: Family Medicine

## 2015-12-26 ENCOUNTER — Other Ambulatory Visit: Payer: Self-pay | Admitting: *Deleted

## 2015-12-26 MED ORDER — FINASTERIDE 5 MG PO TABS: ORAL_TABLET | ORAL | Status: DC

## 2015-12-26 MED ORDER — FENOFIBRIC ACID 135 MG PO CPDR: DELAYED_RELEASE_CAPSULE | ORAL | Status: DC

## 2015-12-26 MED ORDER — ATORVASTATIN CALCIUM 80 MG PO TABS: 80.0000 mg | ORAL_TABLET | Freq: Every day | ORAL | Status: DC

## 2015-12-26 MED ORDER — ESCITALOPRAM OXALATE 10 MG PO TABS: ORAL_TABLET | ORAL | Status: DC

## 2015-12-26 NOTE — Telephone Encounter (Signed)
Pt called and requested Rx be sent to [facility].  LOV: 07/04/15 NOV: 01/06/16  RF request for Lexapro Last written: 07/11/15 #90 w/ 1  RF request for atorvastatin Last written: 09/24/15 #30 w/ 11  RF request for fenofibric Last written: 09/24/15 #90 w/ 3RF  RF request for finasteride Last written: 08/09/15 #90 w/ 3RF

## 2016-01-01 DIAGNOSIS — Z860100 Personal history of colon polyps, unspecified: Secondary | ICD-10-CM

## 2016-01-01 HISTORY — DX: Personal history of colon polyps, unspecified: Z86.0100

## 2016-01-01 HISTORY — DX: Personal history of colonic polyps: Z86.010

## 2016-01-06 ENCOUNTER — Telehealth: Payer: Self-pay | Admitting: Emergency Medicine

## 2016-01-06 ENCOUNTER — Ambulatory Visit (INDEPENDENT_AMBULATORY_CARE_PROVIDER_SITE_OTHER): Payer: BLUE CROSS/BLUE SHIELD | Admitting: Gastroenterology

## 2016-01-06 ENCOUNTER — Encounter: Payer: Self-pay | Admitting: Gastroenterology

## 2016-01-06 VITALS — BP 128/68 | HR 68 | Ht 70.0 in | Wt 272.0 lb

## 2016-01-06 DIAGNOSIS — R194 Change in bowel habit: Secondary | ICD-10-CM | POA: Diagnosis not present

## 2016-01-06 MED ORDER — NA SULFATE-K SULFATE-MG SULF 17.5-3.13-1.6 GM/177ML PO SOLN: 1.0000 | Freq: Once | ORAL | Status: DC

## 2016-01-06 NOTE — Patient Instructions (Signed)
Please start taking metamucil powder fiber supplement.  This may cause some bloating at first but that usually goes away. Begin with a small spoonful and work your way up to a large, heaping spoonful daily over a week. You will be set up for a colonoscopy for change in bowel habits.

## 2016-01-06 NOTE — Telephone Encounter (Signed)
Patient called requesting clarification on his visit instructions.

## 2016-01-06 NOTE — Progress Notes (Signed)
HPI: This is a   very pleasant 65 year old man    who was referred to me by Tammi Sou, MD  to evaluate  change in bowel habits, personal history of colon polyps .    Chief complaint is change in bowel habits, personal history of colon polyps  I reviewed a colonoscopy report that had been scanned into the electronic medical record here. He had a colonoscopy 5 2011 by Dr. Mali nor at the [facility] hand clinic, this was done for "changed in bowel habits, constipation and history of polyps". The prep was "adequate" and the examination was to the cecum. No polyps were found. He was recommended to have repeat colonoscopy at five-year intervertebral due to "history of colon polyps".  Has had three colonoscopies.    we do not have his first 2 colonoscopy reports here we do have the third, most recent one. He does tell me that he had polyps on his second colonoscopy and was told to have recall at 5 years. We do not have the pathology from that report. [place] most recent colonoscopy in 2011.  His bowels have changed in the past 1-2 months.  In the morning he usually has BM, soft easy BM.  IN past 1-2 months he's had to strain, push.  Then will often have BM again shortly afterwards.  No overt GI bleeding.  Lately more regular with insulin.   Review of systems: Pertinent positive and negative review of systems were noted in the above HPI section. Complete review of systems was performed and was otherwise normal.   Past Medical History  Diagnosis Date  . History of scarlet fever     age 65  . 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 Keratoconus     dx'd 1973  . Depression     Hospitalized for suicidal ideation 6.  Has been on lexapro since 10/2008.  [name] Hyperlipidemia, mixed 1993    myalgias on pravastatin  . Hypertension 2009    "marked chronic cardiomegaly" on CXR 01/2009 per old records.  . Diabetes mellitus type II     Dx'd  approximately 06/2008  . OSA (obstructive sleep apnea) 2004    intolerant of CPAP  . History of pneumonia 2008    Hospitalized  . DDD (degenerative disc disease), lumbar 2010    laminectomy 01/2009  . BPH (benign prostatic hypertrophy)   . Insomnia   . Rhinitis, chronic   . Erectile dysfunction   . 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 History of colon polyps   . Osteoarthritis of both knees     Mild plain film changes in medial compartment bilat    Past Surgical History  Procedure Laterality Date  . Appendectomy  1972  . Vasectomy  1994  . 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 Tonsillectomy and adenoidectomy      age 65  . Urethral dilation      2 as a child, one as an adult  . Colonoscopy  X 3    Most recent was 2007 [place], [place]), with removal of polyps in the first two.  [name] Electrocardiogram  01/29/2009    NORMAL  . Lumbar laminectomy  01/2009    Current Outpatient Prescriptions  Medication Sig Dispense Refill  . aspirin 325 MG tablet Take 325 mg by mouth daily.      [name]  atorvastatin (LIPITOR) 80 MG tablet Take 1 tablet (80 mg total) by mouth daily. 90 tablet 3  . Choline Fenofibrate (FENOFIBRIC ACID) 135 MG CPDR take 1 capsule by mouth once daily 90 capsule 3  . escitalopram (LEXAPRO) 10 MG tablet take 1 tablet by mouth once daily 90 tablet 3  . finasteride (PROSCAR) 5 MG tablet take 1 tablet by mouth once daily 90 tablet 3  . glucose blood (ONE TOUCH ULTRA TEST) test strip Check glucose three times per day 100 each 12  . HUMALOG KWIKPEN 100 UNIT/ML KiwkPen inject 5 units subcutaneously before meals 15 mL 6  . Insulin Detemir (LEVEMIR FLEXTOUCH) 100 UNIT/ML Pen 30 U SQ qhs 15 mL 6  . insulin lispro (HUMALOG) 100 UNIT/ML injection 5 U SQ qAC 15 mL 11  . Insulin Pen Needle (NOVOFINE) 32G X 6 MM MISC Use for insulin injection 4 times per day 100 each 12  . lisinopril (PRINIVIL,ZESTRIL) 20 MG tablet take 1 tablet by mouth once daily 90 tablet 1  .  metFORMIN (GLUCOPHAGE) 500 MG tablet take 2 tablets by mouth twice a day with food 360 tablet 1  . Naproxen Sodium (ALEVE) 220 MG CAPS Take 2 capsules by mouth at bedtime as needed.      . Omega-3 Fatty Acids (FISH OIL) 1000 MG CAPS Take 1 capsule by mouth 2 (two) times daily.    . tamsulosin (FLOMAX) 0.4 MG CAPS capsule Take 2 capsules (0.8 mg total) by mouth at bedtime. 180 capsule 1  . vitamin B-12 (CYANOCOBALAMIN) 1000 MCG tablet Take 1,000 mcg by mouth daily.       No current facility-administered medications for this visit.    Allergies as of 01/06/2016 - Review Complete 12/17/2015  Allergen Reaction Noted  . Simvastatin Other (See Comments) 07/27/2011  . Antihistamines, diphenhydramine-type Other (See Comments) 07/15/2011  . Codeine Nausea Only 07/08/2011  . Penicillins Hives 07/08/2011  . Sulfa antibiotics Hives 07/08/2011    Family History  Problem Relation Age of Onset  . Heart disease Mother   . Hyperlipidemia Mother   . Stroke Mother   . Diabetes Mother   . Depression Mother   . Alzheimer's disease Mother   . Stroke Father   . Hyperlipidemia Father   . Heart disease Father   . Diabetes Father   . Alcohol abuse Sister   . Depression Sister     Social History   Social History  . Marital Status: Married    Spouse Name: N/A  . Number of Children: N/A  . Years of Education: N/A   Occupational History  . Not on file.   Social History Main Topics  . Smoking status: Never Smoker   . Smokeless tobacco: Never Used  . Alcohol Use: Yes     Comment: social, 3-4 drinks month  . Drug Use: No  . Sexual Activity: Not on file   Other Topics Concern  . Not on file   Social History Narrative   Divorced, then remarried.  Has 3 sons, no grandchildren.   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   No tobacco.  Rare ETOH.  No drug abuse.   Enjoys reading and spending time with his 2 dogs.      Physical Exam: Ht 5\' 10"  (1.778 m)  Wt 272 lb (123.378 kg)  BMI 39.03 kg/m2 Constitutional: generally well-appearing Psychiatric: alert and oriented x3 Eyes: extraocular movements intact Mouth: oral pharynx moist,  no lesions Neck: supple no lymphadenopathy Cardiovascular: heart regular rate and rhythm Lungs: clear to auscultation bilaterally Abdomen: soft, nontender, nondistended, no obvious ascites, no peritoneal signs, normal bowel sounds Extremities: no lower extremity edema bilaterally Skin: no lesions on visible extremities   Assessment and plan: 65 y.o. male with  change in bowel habits, personal history of colon polyps he has had a clear change in his bowel habits in the past 2-3 months. I recommended he try fiber supplements and he will undergo colonoscopy to exclude significant pathology that could cause a change such as that. I think it is unlikely that he has underlying significant problem. He has had colon polyps in the past however we do not have the pathology reports. He is under the understanding that they were not large or serious. Pending the results of his upcoming colonoscopy I will advise him on the timing of next examination if no polyps are seen, likely he will be fine for 10 years.   Owens Loffler, MD [facility] 01/06/2016, 11:18 AM  Cc: McGowen, Adrian Blackwater, MD

## 2016-01-07 NOTE — Telephone Encounter (Signed)
The pt spoke with [employer] and then spoke with [facility] and he is insured and will pick up prep when it is in stock, he will call with any other problems or concerns

## 2016-01-29 ENCOUNTER — Other Ambulatory Visit: Payer: Self-pay | Admitting: Family Medicine

## 2016-01-29 NOTE — Telephone Encounter (Signed)
RF request for tamsulosin LOV: 07/04/15 Next ov: None Last written: 06/24/15 #180 w/ 1RF

## 2016-02-04 ENCOUNTER — Telehealth: Payer: Self-pay | Admitting: Gastroenterology

## 2016-02-04 MED ORDER — NA SULFATE-K SULFATE-MG SULF 17.5-3.13-1.6 GM/177ML PO SOLN: 1.0000 | Freq: Once | ORAL | Status: DC

## 2016-02-04 NOTE — Telephone Encounter (Signed)
Instructions mailed and the prep was resent to the pharmacy

## 2016-02-21 ENCOUNTER — Encounter: Payer: Self-pay | Admitting: Gastroenterology

## 2016-02-21 ENCOUNTER — Ambulatory Visit (AMBULATORY_SURGERY_CENTER): Payer: BLUE CROSS/BLUE SHIELD | Admitting: Gastroenterology

## 2016-02-21 VITALS — BP 111/70 | HR 87 | Temp 97.3°F | Resp 11 | Ht 70.0 in | Wt 272.0 lb

## 2016-02-21 DIAGNOSIS — D123 Benign neoplasm of transverse colon: Secondary | ICD-10-CM | POA: Diagnosis not present

## 2016-02-21 DIAGNOSIS — R194 Change in bowel habit: Secondary | ICD-10-CM | POA: Diagnosis present

## 2016-02-21 MED ORDER — SODIUM CHLORIDE 0.9 % IV SOLN: 500.0000 mL | INTRAVENOUS | Status: DC

## 2016-02-21 NOTE — Op Note (Signed)
[place] Patient Name: [name] [name] Procedure Date: 02/21/2016 1:38 PM MRN: [id number] Endoscopist: Milus Banister , MD Age: 65 Referring MD:  Date of Birth: 1951/03/12 Gender: Male Procedure:                Colonoscopy Indications:              Change in bowel habits Medicines:                Monitored Anesthesia Care Procedure:                Pre-Anesthesia Assessment:                           - Prior to the procedure, a History and Physical                            was performed, and patient medications and                            allergies were reviewed. The patient's tolerance of                            previous anesthesia was also reviewed. The risks                            and benefits of the procedure and the sedation                            options and risks were discussed with the patient.                            All questions were answered, and informed consent                            was obtained. Prior Anticoagulants: The patient has                            taken no previous anticoagulant or antiplatelet                            agents. ASA Grade Assessment: II - A patient with                            mild systemic disease. After reviewing the risks                            and benefits, the patient was deemed in                            satisfactory condition to undergo the procedure.                           After obtaining informed consent, the colonoscope  was passed under direct vision. Throughout the                            procedure, the patient's blood pressure, pulse, and                            oxygen saturations were monitored continuously. The                            Model CF-HQ190L [id number] [id number]) scope was introduced                            through the anus and advanced to the the cecum,                            identified by appendiceal orifice and ileocecal                     valve. The colonoscopy was performed without                            difficulty. The patient tolerated the procedure                            well. The quality of the bowel preparation was                            excellent. The ileocecal valve, appendiceal                            orifice, and rectum were photographed. Scope In: 1:42:45 PM Scope Out: 1:57:45 PM Scope Withdrawal Time: 0 hours 13 minutes 22 seconds  Total Procedure Duration: 0 hours 15 minutes 0 seconds  Findings:      Three sessile polyps were found in the transverse colon. The polyps were       3 to 5 mm in size. These polyps were removed with a cold snare.       Resection and retrieval were complete.      Multiple small and large-mouthed diverticula were found in the left       colon.      The exam was otherwise without abnormality on direct and retroflexion       views. Complications:            No immediate complications. Estimated Blood Loss:     Estimated blood loss: none. Impression:               - Three 3 to 5 mm polyps in the transverse colon,                            removed with a cold snare. Resected and retrieved.                           - Diverticulosis in the left colon.                           -  The examination was otherwise normal on direct                            and retroflexion views. Recommendation:           - Patient has a contact number available for                            emergencies. The signs and symptoms of potential                            delayed complications were discussed with the                            patient. Return to normal activities tomorrow.                            Written discharge instructions were provided to the                            patient.                           - Resume previous diet.                           - Continue present medications.                           - Repeat colonoscopy is recommended. The                             colonoscopy date will be determined after pathology                            results from today's exam become available for                            review.                           -                           You will receive a letter within 2-3 weeks with the                            pathology results and my final recommendations.                           If the polyp(s) is proven to be 'pre-cancerous' on                            pathology, you will need repeat colonoscopy in 3  years. If the polyp(s) is NOT 'precancerous' on                            pathology then you should repeat colon cancer                            screening in 10 years with colonoscopy without need                            for colon cancer screening by any method prior to                            then (including stool testing). Procedure Code(s):        --- Professional ---                           [phone], Colonoscopy, flexible; with removal of                            tumor(s), polyp(s), or other lesion(s) by snare                            technique CPT copyright 2016 [employer]. All rights reserved. Milus Banister, MD 02/21/2016 2:01:22 PM This report has been signed electronically. Number of Addenda: 0 Referring MD:      Tammi Sou

## 2016-02-21 NOTE — Patient Instructions (Signed)
YOU HAD AN ENDOSCOPIC PROCEDURE TODAY AT THE  ENDOSCOPY CENTER:   Refer to the procedure report that was given to you for any specific questions about what was found during the examination.  If the procedure report does not answer your questions, please call your gastroenterologist to clarify.  If you requested that your care partner not be given the details of your procedure findings, then the procedure report has been included in a sealed envelope for you to review at your convenience later.  YOU SHOULD EXPECT: Some feelings of bloating in the abdomen. Passage of more gas than usual.  Walking can help get rid of the air that was put into your GI tract during the procedure and reduce the bloating. If you had a lower endoscopy (such as a colonoscopy or flexible sigmoidoscopy) you may notice spotting of blood in your stool or on the toilet paper. If you underwent a bowel prep for your procedure, you may not have a normal bowel movement for a few days.  Please Note:  You might notice some irritation and congestion in your nose or some drainage.  This is from the oxygen used during your procedure.  There is no need for concern and it should clear up in a day or so.  SYMPTOMS TO REPORT IMMEDIATELY:   Following lower endoscopy (colonoscopy or flexible sigmoidoscopy):  Excessive amounts of blood in the stool  Significant tenderness or worsening of abdominal pains  Swelling of the abdomen that is new, acute  Fever of 100F or higher   For urgent or emergent issues, a gastroenterologist can be reached at any hour by calling [phone].   DIET: Your first meal following the procedure should be a small meal and then it is ok to progress to your normal diet. Heavy or fried foods are harder to digest and may make you feel nauseous or bloated.  Likewise, meals heavy in dairy and vegetables can increase bloating.  Drink plenty of fluids but you should avoid alcoholic beverages for 24  hours.  ACTIVITY:  You should plan to take it easy for the rest of today and you should NOT DRIVE or use heavy machinery until tomorrow (because of the sedation medicines used during the test).    FOLLOW UP: Our staff will call the number listed on your records the next business day following your procedure to check on you and address any questions or concerns that you may have regarding the information given to you following your procedure. If we do not reach you, we will leave a message.  However, if you are feeling well and you are not experiencing any problems, there is no need to return our call.  We will assume that you have returned to your regular daily activities without incident.  If any biopsies were taken you will be contacted by phone or by letter within the next 1-3 weeks.  Please call us at [phone] if you have not heard about the biopsies in 3 weeks.    SIGNATURES/CONFIDENTIALITY: You and/or your care partner have signed paperwork which will be entered into your electronic medical record.  These signatures attest to the fact that that the information above on your After Visit Summary has been reviewed and is understood.  Full responsibility of the confidentiality of this discharge information lies with you and/or your care-partner.  Polyp, diverticulosis and high fiber diet information given. 

## 2016-02-21 NOTE — Progress Notes (Signed)
Called to room to assist during endoscopic procedure.  Patient ID and intended procedure confirmed with present staff. Received instructions for my participation in the procedure from the performing physician.  

## 2016-02-21 NOTE — Progress Notes (Signed)
A/ox3 pleased with MAC, report to Jane RN 

## 2016-02-24 ENCOUNTER — Telehealth: Payer: Self-pay | Admitting: *Deleted

## 2016-02-24 NOTE — Telephone Encounter (Signed)
  Follow up Call-  Call back number 02/21/2016  Permission to leave phone message Yes     Patient questions:  Do you have a fever, pain , or abdominal swelling? No. Pain Score  0 *  Have you tolerated food without any problems? Yes.    Have you been able to return to your normal activities? Yes.    Do you have any questions about your discharge instructions: Diet   No. Medications  No. Follow up visit  No.  Do you have questions or concerns about your Care? No.  Actions: * If pain score is 4 or above: No action needed, pain <4.

## 2016-02-26 ENCOUNTER — Encounter: Payer: Self-pay | Admitting: Gastroenterology

## 2016-03-27 ENCOUNTER — Other Ambulatory Visit: Payer: Self-pay | Admitting: Family Medicine

## 2016-03-27 NOTE — Telephone Encounter (Signed)
Left message for pt to call back  °

## 2016-03-27 NOTE — Telephone Encounter (Signed)
RF request for lisinopril LOV: 07/04/15 Next ov: None Last written: 08/22/15 #90 w/ 1RF  Rx sent for #90 w/0RF. Pt is over due for f/u [facility], needs office visit for more refills.

## 2016-03-31 NOTE — Telephone Encounter (Signed)
Left message for pt to call back  °

## 2016-05-01 ENCOUNTER — Other Ambulatory Visit: Payer: Self-pay | Admitting: Family Medicine

## 2016-05-01 NOTE — Telephone Encounter (Signed)
RF request for metformin LOV: 07/04/15 - pt was to f/u in November 2016 but cancelled apt Next ov: None Last written: 08/22/15 #360 w/ 1RF  Rx sent for #360 w/ 0RF. Pt needs to schedule f/u apt for more refills.

## 2016-05-01 NOTE — Telephone Encounter (Signed)
Pt advised and voiced understanding.  Apt made for 05/15/16 at 10:15am.

## 2016-05-06 ENCOUNTER — Other Ambulatory Visit: Payer: Self-pay | Admitting: Family Medicine

## 2016-05-15 ENCOUNTER — Ambulatory Visit: Payer: BLUE CROSS/BLUE SHIELD | Admitting: Family Medicine

## 2016-05-20 ENCOUNTER — Other Ambulatory Visit: Payer: Self-pay | Admitting: Family Medicine

## 2016-05-21 NOTE — Telephone Encounter (Signed)
RF request for finasteride LOV: 07/04/15 Next ov: 06/11/16 Last written: 12/26/15 #90 w/ 3RF

## 2016-06-08 ENCOUNTER — Other Ambulatory Visit: Payer: Self-pay | Admitting: *Deleted

## 2016-06-08 MED ORDER — INSULIN ASPART 100 UNIT/ML FLEXPEN: PEN_INJECTOR | SUBCUTANEOUS | Status: DC

## 2016-06-08 NOTE — Telephone Encounter (Signed)
Received fax from pharmacy requesting PA for Humalog. Per Hassan Rowan at [facility] prefers [employer]. Rx for Humalog d/c and new Rx for Novolog sent, okay per Dr. Anitra Lauth.

## 2016-06-11 ENCOUNTER — Ambulatory Visit: Payer: BLUE CROSS/BLUE SHIELD | Admitting: Family Medicine

## 2016-07-05 ENCOUNTER — Other Ambulatory Visit: Payer: Self-pay | Admitting: Family Medicine

## 2016-07-31 ENCOUNTER — Ambulatory Visit (INDEPENDENT_AMBULATORY_CARE_PROVIDER_SITE_OTHER): Payer: 59 | Admitting: Family Medicine

## 2016-07-31 ENCOUNTER — Encounter: Payer: Self-pay | Admitting: Family Medicine

## 2016-07-31 VITALS — BP 143/90 | HR 80 | Temp 97.9°F | Resp 16 | Ht 70.0 in | Wt 279.0 lb

## 2016-07-31 DIAGNOSIS — E785 Hyperlipidemia, unspecified: Secondary | ICD-10-CM | POA: Diagnosis not present

## 2016-07-31 DIAGNOSIS — Z23 Encounter for immunization: Secondary | ICD-10-CM

## 2016-07-31 DIAGNOSIS — I10 Essential (primary) hypertension: Secondary | ICD-10-CM

## 2016-07-31 DIAGNOSIS — E119 Type 2 diabetes mellitus without complications: Secondary | ICD-10-CM

## 2016-07-31 LAB — COMPREHENSIVE METABOLIC PANEL
ALT: 27 U/L (ref 0–53)
AST: 15 U/L (ref 0–37)
Albumin: 4.1 g/dL (ref 3.5–5.2)
Alkaline Phosphatase: 47 U/L (ref 39–117)
BUN: 13 mg/dL (ref 6–23)
CO2: 25 mEq/L (ref 19–32)
Calcium: 9.4 mg/dL (ref 8.4–10.5)
Chloride: 100 mEq/L (ref 96–112)
Creatinine, Ser: 0.73 mg/dL (ref 0.40–1.50)
GFR: 115.11 mL/min (ref >60.00)
Glucose, Bld: 182 mg/dL — ABNORMAL HIGH (ref 70–99)
Potassium: 4 mEq/L (ref 3.5–5.1)
Sodium: 135 mEq/L (ref 135–145)
Total Bilirubin: 0.5 mg/dL (ref 0.2–1.2)
Total Protein: 6.5 g/dL (ref 6.0–8.3)

## 2016-07-31 LAB — LIPID PANEL
Cholesterol: 382 mg/dL — ABNORMAL HIGH (ref 0–200)
HDL: 40 mg/dL (ref >39.00)
Total CHOL/HDL Ratio: 10
Triglycerides: 984 mg/dL — ABNORMAL HIGH (ref 0.0–149.0)

## 2016-07-31 LAB — HEMOGLOBIN A1C: Hgb A1c MFr Bld: 9.3 % — ABNORMAL HIGH (ref 4.6–6.5)

## 2016-07-31 LAB — LDL CHOLESTEROL, DIRECT: Direct LDL: 112 mg/dL

## 2016-07-31 NOTE — Progress Notes (Signed)
OFFICE VISIT  07/31/2016   CC: f/u chronic illness  HPI:    Patient is a 65 y.o. Caucasian male who presents for f/u DM 2, HTN, hyperlipidemia.   Due to some insurance/financial troubles, he has not followed up with me in a year. Fasting gluc's: 200-220 range.  Taking levemir 34 U qhs, humalog 5 U qAC.  He is due for eye exam. 2H PP gluc's: 240s.  Says he is trying to eat a diabetic diet.  He joined the [employer] but says "now I just have to get there and use it".  No home bp monitoring. Compliant with meds.  Tolerating statin fine.  ROS: no CP, no SOB, no palpitations, no SOB, no melena, no fevers.  Past Medical History:  Diagnosis Date  . BPH (benign prostatic hypertrophy)   . DDD (degenerative disc disease), lumbar 2010   laminectomy 01/2009  . Depression    Hospitalized for suicidal ideation 58.  Has been on lexapro since 10/2008.  . Diabetes mellitus type II    Dx'd approximately 06/2008  . Erectile dysfunction   . History of colon polyps 01/2016   Recall 3 yrs  . History of pneumonia 2008   Hospitalized  . History of scarlet fever    age 5  . Hyperlipidemia, mixed 1993   myalgias on pravastatin  . Hypertension 2009   "marked chronic cardiomegaly" on CXR 01/2009 per old records.  . 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 Insomnia   . Keratoconus    dx'd 1973  . OSA (obstructive sleep apnea) 2004   intolerant of CPAP  . Osteoarthritis of both knees    Mild plain film changes in medial compartment bilat  . Rhinitis, chronic   . 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 APPENDECTOMY  1972  . COLONOSCOPY  X 4   Most recent was 2007 [place], [place]), with removal of polyps in the first two.  02/21/16: tubular adenoma.  Recall 3 yrs (Dr. Ardis Hughs).  . 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 ELECTROCARDIOGRAM  01/29/2009   NORMAL  . LUMBAR LAMINECTOMY   01/2009  . TONSILLECTOMY AND ADENOIDECTOMY     age 61  . URETHRAL DILATION     2 as a child, one as an adult  . VASECTOMY  1994    Outpatient Medications Prior to Visit  Medication Sig Dispense Refill  . aspirin 325 MG tablet Take 325 mg by mouth daily.      [name] atorvastatin (LIPITOR) 80 MG tablet Take 1 tablet (80 mg total) by mouth daily. 90 tablet 3  . Choline Fenofibrate (FENOFIBRIC ACID) 135 MG CPDR take 1 capsule by mouth once daily 90 capsule 3  . escitalopram (LEXAPRO) 10 MG tablet take 1 tablet by mouth once daily 90 tablet 0  . finasteride (PROSCAR) 5 MG tablet take 1 tablet by mouth once daily 90 tablet 1  . glucose blood (ONE TOUCH ULTRA TEST) test strip Check glucose three times per day 100 each 12  . insulin aspart (NOVOLOG) 100 UNIT/ML FlexPen Inject 5 units subcutaneously before meals 15 mL 1  . Insulin Detemir (LEVEMIR FLEXTOUCH) 100 UNIT/ML Pen 30 U SQ qhs 15 mL 6  . insulin lispro (HUMALOG) 100 UNIT/ML injection 5 U SQ qAC 15 mL 11  . Insulin Pen Needle (NOVOFINE) 32G X  6 MM MISC Use for insulin injection 4 times per day 100 each 12  . lisinopril (PRINIVIL,ZESTRIL) 20 MG tablet take 1 tablet by mouth once daily 90 tablet 0  . metFORMIN (GLUCOPHAGE) 500 MG tablet take 2 tablets by mouth twice a day with food 360 tablet 0  . Naproxen Sodium (ALEVE) 220 MG CAPS Take 2 capsules by mouth at bedtime as needed.      . Omega-3 Fatty Acids (FISH OIL) 1000 MG CAPS Take 1 capsule by mouth 2 (two) times daily.    . tamsulosin (FLOMAX) 0.4 MG CAPS capsule take 2 capsules by mouth at bedtime 180 capsule 1  . vitamin B-12 (CYANOCOBALAMIN) 1000 MCG tablet Take 1,000 mcg by mouth daily.      [name] escitalopram (LEXAPRO) 10 MG tablet take 1 tablet by mouth once daily 90 tablet 3  . finasteride (PROSCAR) 5 MG tablet take 1 tablet by mouth once daily 90 tablet 3   No facility-administered medications prior to visit.     Allergies  Allergen Reactions  . Simvastatin Other (See Comments)     myalgias  . Antihistamines, Diphenhydramine-Type Other (See Comments)    depression  . Codeine Nausea Only  . Penicillins Hives  . Sulfa Antibiotics Hives    ROS As per HPI  PE: Blood pressure (!) 143/90, pulse 80, temperature 97.9 F (36.6 C), temperature source Temporal, resp. rate 16, height 5\' 10"  (1.778 m), weight 279 lb (126.6 kg), SpO2 95 %. Gen: Alert, well appearing.  Patient is oriented to person, place, time, and situation. CV: RRR, no m/r/g.   LUNGS: CTA bilat, nonlabored resps, good aeration in all lung fields. EXT: no clubbing, cyanosis, or edema.   LABS:  Lab Results  Component Value Date   TSH 1.46 05/21/2014   Lab Results  Component Value Date   WBC 6.2 05/21/2014   HGB 13.0 05/21/2014   HCT 39.1 05/21/2014   MCV 87.8 05/21/2014   PLT 211.0 05/21/2014   Lab Results  Component Value Date   CREATININE 0.83 03/05/2015   BUN 24 (H) 03/05/2015   NA 135 03/05/2015   K 4.1 03/05/2015   CL 103 03/05/2015   CO2 24 03/05/2015   Lab Results  Component Value Date   ALT 20 03/05/2015   AST 16 03/05/2015   ALKPHOS 30 (L) 03/05/2015   BILITOT 0.4 03/05/2015   Lab Results  Component Value Date   CHOL 156 03/05/2015   Lab Results  Component Value Date   HDL 41.90 03/05/2015   Lab Results  Component Value Date   LDLCALC 37 05/21/2014   Lab Results  Component Value Date   TRIG 208.0 (H) 03/05/2015   Lab Results  Component Value Date   CHOLHDL 4 03/05/2015   Lab Results  Component Value Date   PSA 0.03 (L) 07/04/2015   PSA 0.02 (L) 05/21/2014   PSA 0.03 (L) 05/10/2013   Lab Results  Component Value Date   HGBA1C 8.7 (H) 07/04/2015    IMPRESSION AND PLAN:  1) DM 2, lost to f/u x 1 yr. Glucoses not well controlled as per pt's report of home measurements. Check A1c today.  He needs to schedule eye exam.  He is unable to urinate for me today so I'll do urine microalb/cr next f/u visit. Feet exam normal today.  2) HTN: The current medical  regimen is effective;  continue present plan and medications. Lytes/cr today.  3) Hyperlipidemia: tolerating statin.  Check FLP and AST/ALT today.  An After Visit Summary was printed and given to the patient.  FOLLOW UP: Return in about 3 months (around 10/30/2016) for routine chronic illness f/u.  Signed:  Crissie Sickles, MD           07/31/2016

## 2016-08-04 ENCOUNTER — Other Ambulatory Visit: Payer: Self-pay | Admitting: Family Medicine

## 2016-08-04 MED ORDER — ESCITALOPRAM OXALATE 10 MG PO TABS: 10.0000 mg | ORAL_TABLET | Freq: Every day | ORAL | 1 refills | Status: DC

## 2016-08-04 MED ORDER — LISINOPRIL 20 MG PO TABS: 20.0000 mg | ORAL_TABLET | Freq: Every day | ORAL | 1 refills | Status: DC

## 2016-08-04 MED ORDER — METFORMIN HCL 500 MG PO TABS: ORAL_TABLET | ORAL | 1 refills | Status: DC

## 2016-08-20 ENCOUNTER — Other Ambulatory Visit: Payer: Self-pay | Admitting: Family Medicine

## 2016-09-21 ENCOUNTER — Other Ambulatory Visit: Payer: Self-pay | Admitting: Family Medicine

## 2016-10-02 ENCOUNTER — Other Ambulatory Visit: Payer: Self-pay | Admitting: Family Medicine

## 2016-10-08 ENCOUNTER — Other Ambulatory Visit: Payer: Self-pay | Admitting: Family Medicine

## 2016-11-06 ENCOUNTER — Other Ambulatory Visit: Payer: Self-pay | Admitting: Family Medicine

## 2016-12-05 ENCOUNTER — Other Ambulatory Visit: Payer: Self-pay | Admitting: Family Medicine

## 2016-12-09 ENCOUNTER — Other Ambulatory Visit: Payer: Self-pay | Admitting: Family Medicine

## 2016-12-10 NOTE — Telephone Encounter (Signed)
RF request for finasteride LOV: 07/31/16 Next ov: None Last written: 05/21/16 #90 w/ 1RF

## 2016-12-18 ENCOUNTER — Other Ambulatory Visit: Payer: Self-pay | Admitting: Family Medicine

## 2016-12-18 NOTE — Telephone Encounter (Signed)
RF request for novolog LOV: 07/31/16 Next ov: None Last written: 06/08/16 #23ml w/ 1RF

## 2016-12-25 DIAGNOSIS — H35372 Puckering of macula, left eye: Secondary | ICD-10-CM | POA: Diagnosis not present

## 2017-01-11 ENCOUNTER — Other Ambulatory Visit: Payer: Self-pay | Admitting: Family Medicine

## 2017-01-11 NOTE — Telephone Encounter (Signed)
[place].  RF request for atorvastatin LOV: 07/31/16 Next ov: None Last written: 12/07/16 #30 w/ 0RF  Will sent Rx for #30 w/ 0RF. Pt needs office visit for more refills.   Tried calling pt, NA and unable to leave a message.

## 2017-01-14 ENCOUNTER — Other Ambulatory Visit: Payer: Self-pay | Admitting: Family Medicine

## 2017-01-14 NOTE — Telephone Encounter (Signed)
[facility] [place].  RF request for lisinopril LOV: 07/31/16 Next ov: None Last written: 08/04/16 #90 w/ 1RF  SW pt and apt for f/u [facility] was made for 01/18/17 at 8:00am.

## 2017-01-14 NOTE — Telephone Encounter (Signed)
SW pt and apt for f/u [facility] was scheduled for 01/18/17 at 8:00am.

## 2017-01-18 ENCOUNTER — Ambulatory Visit (INDEPENDENT_AMBULATORY_CARE_PROVIDER_SITE_OTHER): Payer: BLUE CROSS/BLUE SHIELD | Admitting: Family Medicine

## 2017-01-18 ENCOUNTER — Encounter: Payer: Self-pay | Admitting: Family Medicine

## 2017-01-18 VITALS — BP 117/69 | HR 83 | Temp 98.3°F | Resp 16 | Ht 70.0 in | Wt 273.8 lb

## 2017-01-18 DIAGNOSIS — E7800 Pure hypercholesterolemia, unspecified: Secondary | ICD-10-CM

## 2017-01-18 DIAGNOSIS — E119 Type 2 diabetes mellitus without complications: Secondary | ICD-10-CM | POA: Diagnosis not present

## 2017-01-18 DIAGNOSIS — J3081 Allergic rhinitis due to animal (cat) (dog) hair and dander: Secondary | ICD-10-CM

## 2017-01-18 DIAGNOSIS — Z1159 Encounter for screening for other viral diseases: Secondary | ICD-10-CM

## 2017-01-18 DIAGNOSIS — I10 Essential (primary) hypertension: Secondary | ICD-10-CM

## 2017-01-18 LAB — HEMOGLOBIN A1C
Hgb A1c MFr Bld: 8.9 % — ABNORMAL HIGH (ref <5.7)
Mean Plasma Glucose: 209 mg/dL

## 2017-01-18 LAB — BASIC METABOLIC PANEL
BUN: 18 mg/dL (ref 7–25)
CO2: 24 mmol/L (ref 20–31)
Calcium: 9.2 mg/dL (ref 8.6–10.3)
Chloride: 102 mmol/L (ref 98–110)
Creat: 0.94 mg/dL (ref 0.70–1.25)
Glucose, Bld: 230 mg/dL — ABNORMAL HIGH (ref 65–99)
Potassium: 4.2 mmol/L (ref 3.5–5.3)
Sodium: 137 mmol/L (ref 135–146)

## 2017-01-18 LAB — LIPID PANEL
Cholesterol: 181 mg/dL (ref <200)
HDL: 40 mg/dL — ABNORMAL LOW (ref >40)
LDL Cholesterol: 89 mg/dL (ref <100)
Total CHOL/HDL Ratio: 4.5 Ratio (ref <5.0)
Triglycerides: 258 mg/dL — ABNORMAL HIGH (ref <150)
VLDL: 52 mg/dL — ABNORMAL HIGH (ref <30)

## 2017-01-18 MED ORDER — FLUTICASONE PROPIONATE 50 MCG/ACT NA SUSP: 2.0000 | Freq: Every day | NASAL | 11 refills | Status: DC

## 2017-01-18 MED ORDER — MONTELUKAST SODIUM 10 MG PO TABS: 10.0000 mg | ORAL_TABLET | Freq: Every day | ORAL | 11 refills | Status: DC

## 2017-01-18 MED ORDER — MOMETASONE FUROATE 50 MCG/ACT NA SUSP: NASAL | 11 refills | Status: DC

## 2017-01-18 NOTE — Addendum Note (Signed)
Addended by: Tammi Sou on: 01/18/2017 11:03 AM   Modules accepted: Orders

## 2017-01-18 NOTE — Progress Notes (Signed)
Pre visit review using our clinic review tool, if applicable. No additional management support is needed unless otherwise documented below in the visit note. 

## 2017-01-18 NOTE — Progress Notes (Signed)
OFFICE VISIT  01/18/2017   CC:  Chief Complaint  Patient presents with  . Follow-up    [facility], pt is fasting.    HPI:    Patient is a 66 y.o. Caucasian male who presents for 5 mo f/u DM 2, HTN, HLD.  DM: fasting "from 190-240", 2H PP same.  Compliant with levemir 39 U qhs and novolog 8 U qAC. No hypoglycemia.  Says he's eating a diabetic diet. No exercise. Feet: No burning or tingling.  Has numbness only on left 1st and 2nd toes, plantar surface.  HTN: no home bp monitoring.  Compliant with lisinopril.  HLD; compliant with atorv and feno w/out adverse effect.  Chronic nasal congestion worse x 2 mo.  Sneezing+.  Was coughing/chest congestion briefly but this cleared up.  Occ runny nose, clear mucous.  No new contact allergen/irritant.  He has 3 dogs. Opening up the house helps.   Saline irrigation of nose no help.  Nasal steroid not helpful b/c he finds it irritates nasal lining/causes to mild bleeding.  OTC antihist cause depression. Has never been on singulair.   Past Medical History:  Diagnosis Date  . BPH (benign prostatic hypertrophy)   . DDD (degenerative disc disease), lumbar 2010   laminectomy 01/2009  . Depression    Hospitalized for suicidal ideation 41.  Has been on lexapro since 10/2008.  . Diabetes mellitus type II    Dx'd approximately 06/2008  . Erectile dysfunction   . History of colon polyps 01/2016   Recall 3 yrs  . History of pneumonia 2008   Hospitalized  . History of scarlet fever    age 41  . Hyperlipidemia, mixed 1993   myalgias on pravastatin  . Hypertension 2009   "marked chronic cardiomegaly" on CXR 01/2009 per old records.  . 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 Insomnia   . Keratoconus    dx'd 1973  . OSA (obstructive sleep apnea) 2004   intolerant of CPAP  . Osteoarthritis of both knees    Mild plain film changes in medial compartment bilat  . Rhinitis, chronic   . 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 APPENDECTOMY  1972  . COLONOSCOPY  X 4   Most recent was 2007 [place], [place]), with removal of polyps in the first two.  02/21/16: tubular adenoma.  Recall 3 yrs (Dr. Ardis Hughs).  . 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 ELECTROCARDIOGRAM  01/29/2009   NORMAL  . LUMBAR LAMINECTOMY  01/2009  . TONSILLECTOMY AND ADENOIDECTOMY     age 67  . URETHRAL DILATION     2 as a child, one as an adult  . VASECTOMY  1994      Allergies  Allergen Reactions  . Simvastatin Other (See Comments)    myalgias  . Antihistamines, Diphenhydramine-Type Other (See Comments)    depression  . Codeine Nausea Only  . Penicillins Hives  . Sulfa Antibiotics Hives    ROS As per HPI  PE: Blood pressure 117/69, pulse 83, temperature 98.3 F (36.8 C), temperature source Oral, resp. rate 16, height 5\' 10"  (1.778 m), weight 273 lb 12 oz (124.2 kg), SpO2 93 %. Gen: Alert, well appearing.  Patient is oriented to person, place, time, and situation. AFFECT: pleasant, lucid thought and speech. Foot exam - bilateral normal; no swelling,  tenderness or skin or vascular lesions. Color and temperature is normal. Sensation is intact. Peripheral pulses are palpable. Toenails are normal.   LABS:  Lab Results  Component Value Date   TSH 1.46 05/21/2014   Lab Results  Component Value Date   WBC 6.2 05/21/2014   HGB 13.0 05/21/2014   HCT 39.1 05/21/2014   MCV 87.8 05/21/2014   PLT 211.0 05/21/2014   Lab Results  Component Value Date   CREATININE 0.73 07/31/2016   BUN 13 07/31/2016   NA 135 07/31/2016   K 4.0 07/31/2016   CL 100 07/31/2016   CO2 25 07/31/2016   Lab Results  Component Value Date   ALT 27 07/31/2016   AST 15 07/31/2016   ALKPHOS 47 07/31/2016   BILITOT 0.5 07/31/2016   Lab Results  Component Value Date   CHOL 382 (H) 07/31/2016   Lab Results  Component Value Date   HDL 40.00 07/31/2016   Lab  Results  Component Value Date   LDLCALC 37 05/21/2014   Lab Results  Component Value Date   TRIG (H) 07/31/2016    984.0 Triglyceride is over 400; calculations on Lipids are invalid.   Lab Results  Component Value Date   CHOLHDL 10 07/31/2016   Lab Results  Component Value Date   PSA 0.03 (L) 07/04/2015   PSA 0.02 (L) 05/21/2014   PSA 0.03 (L) 05/10/2013   Lab Results  Component Value Date   HGBA1C 9.3 (H) 07/31/2016   IMPRESSION AND PLAN:  1) DM 2; control is poor.  We'll see what today's HbA1c is before making recommendations in insuling dosing changes. Lytes/cr today. Feet exam normal today. Eye exam UTD.  2) HTN; The current medical regimen is effective;  continue present plan and medications. Lytes/cr today.  3) HLD--mixed: tolerating 80mg  atorv qd and fenofibrate 135mg  qd.   Recheck FLP today.  4) Uncontrolled allergic rhinitis--likely secondary to inside allergens like dust mites and dog dander. Discussed correct way to use flonase and saline nasal spray.  Add singulair 10mg  qd.  An After Visit Summary was printed and given to the patient.  FOLLOW UP: Return in about 3 months (around 04/17/2017) for routine chronic illness f/u.  Signed:  Crissie Sickles, MD           01/18/2017

## 2017-01-19 ENCOUNTER — Encounter: Payer: Self-pay | Admitting: *Deleted

## 2017-01-19 LAB — HEPATITIS C ANTIBODY: HCV Ab: NEGATIVE

## 2017-01-25 ENCOUNTER — Other Ambulatory Visit: Payer: Self-pay | Admitting: Family Medicine

## 2017-02-05 ENCOUNTER — Other Ambulatory Visit: Payer: Self-pay | Admitting: Family Medicine

## 2017-02-13 ENCOUNTER — Other Ambulatory Visit: Payer: Self-pay | Admitting: Family Medicine

## 2017-02-21 ENCOUNTER — Other Ambulatory Visit: Payer: Self-pay | Admitting: Family Medicine

## 2017-03-25 DIAGNOSIS — H18612 Keratoconus, stable, left eye: Secondary | ICD-10-CM | POA: Diagnosis not present

## 2017-03-25 DIAGNOSIS — H179 Unspecified corneal scar and opacity: Secondary | ICD-10-CM | POA: Diagnosis not present

## 2017-03-25 DIAGNOSIS — H35372 Puckering of macula, left eye: Secondary | ICD-10-CM | POA: Diagnosis not present

## 2017-03-25 DIAGNOSIS — E119 Type 2 diabetes mellitus without complications: Secondary | ICD-10-CM | POA: Diagnosis not present

## 2017-03-25 LAB — HM DIABETES EYE EXAM

## 2017-04-15 ENCOUNTER — Ambulatory Visit: Payer: Self-pay | Admitting: Family Medicine

## 2017-04-16 ENCOUNTER — Other Ambulatory Visit: Payer: Self-pay | Admitting: Family Medicine

## 2017-04-16 NOTE — Telephone Encounter (Signed)
LMOM for pt to schedule F/U.  I sent in 30 day supply of lisinopril.

## 2017-04-29 ENCOUNTER — Encounter: Payer: Self-pay | Admitting: Family Medicine

## 2017-04-29 ENCOUNTER — Ambulatory Visit (INDEPENDENT_AMBULATORY_CARE_PROVIDER_SITE_OTHER): Payer: BLUE CROSS/BLUE SHIELD | Admitting: Family Medicine

## 2017-04-29 VITALS — BP 122/74 | HR 80 | Temp 97.7°F | Resp 16 | Ht 70.0 in | Wt 278.8 lb

## 2017-04-29 DIAGNOSIS — E118 Type 2 diabetes mellitus with unspecified complications: Secondary | ICD-10-CM | POA: Diagnosis not present

## 2017-04-29 DIAGNOSIS — Z794 Long term (current) use of insulin: Secondary | ICD-10-CM

## 2017-04-29 DIAGNOSIS — J309 Allergic rhinitis, unspecified: Secondary | ICD-10-CM | POA: Diagnosis not present

## 2017-04-29 LAB — POCT GLYCOSYLATED HEMOGLOBIN (HGB A1C): Hemoglobin A1C: 8.6

## 2017-04-29 MED ORDER — INSULIN DETEMIR 100 UNIT/ML FLEXPEN: PEN_INJECTOR | SUBCUTANEOUS | 6 refills | Status: DC

## 2017-04-29 MED ORDER — INSULIN ASPART 100 UNIT/ML FLEXPEN: PEN_INJECTOR | SUBCUTANEOUS | 2 refills | Status: DC

## 2017-04-29 NOTE — Progress Notes (Signed)
OFFICE VISIT  04/29/2017   CC:  Chief Complaint  Patient presents with  . Follow-up    RCI, pt is fasting.    HPI:    Patient is a 66 y.o. Caucasian male who presents for f/u 3 mo f/u DM 2. Last visit all labs were stable except a1c was increased so I increased his long acting insulin to 42 U qd and mealtime insulin to 10 U qAC. Says glucoses still not improved: avg 190-240 no matter what time of day. Still eating diabetic diet.  No exercise.  Has had excessively busy schedule lately PLUS he says his son was found dead of a drug overdose in [place] about a month ago. Has some polydipsia hs--admits he has dry mouth that could be coming from mouth breathing due to nasal congestion.  Still with lots of nasal congestion, inhibits sleep.  Has been doing saline rinses, singulair, otc allergy meds not helping. He asks for referral to allergist.    Past Medical History:  Diagnosis Date  . BPH (benign prostatic hypertrophy)   . DDD (degenerative disc disease), lumbar 2010   laminectomy 01/2009  . Depression    Hospitalized for suicidal ideation 10.  Has been on lexapro since 10/2008.  . Diabetes mellitus type II    Dx'd approximately 06/2008  . Erectile dysfunction   . History of colon polyps 01/2016   Recall 3 yrs  . History of pneumonia 2008   Hospitalized  . History of scarlet fever    age 43  . Hyperlipidemia, mixed 1993   myalgias on pravastatin  . Hypertension 2009   "marked chronic cardiomegaly" on CXR 01/2009 per old records.  . 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 Insomnia   . Keratoconus    dx'd 1973  . OSA (obstructive sleep apnea) 2004   intolerant of CPAP  . Osteoarthritis of both knees    Mild plain film changes in medial compartment bilat  . Rhinitis, chronic   . 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 APPENDECTOMY  1972  .  COLONOSCOPY  X 4   Most recent was 2007 [place], [place]), with removal of polyps in the first two.  02/21/16: tubular adenoma.  Recall 3 yrs (Dr. Ardis Hughs).  . 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 ELECTROCARDIOGRAM  01/29/2009   NORMAL  . LUMBAR LAMINECTOMY  01/2009  . TONSILLECTOMY AND ADENOIDECTOMY     age 51  . URETHRAL DILATION     2 as a child, one as an adult  . VASECTOMY  1994    Outpatient Medications Prior to Visit  Medication Sig Dispense Refill  . aspirin 325 MG tablet Take 325 mg by mouth daily.      [name] atorvastatin (LIPITOR) 80 MG tablet take 1 tablet by mouth once daily (NEEDS APPOINTMENT) 30 tablet 5  . BD PEN NEEDLE NANO U/F 32G X 4 MM MISC USE FOR INSULIN INJECTION FOUR TIMES A DAY 100 each 12  . Choline Fenofibrate (FENOFIBRIC ACID) 135 MG CPDR take 1 capsule by mouth once daily 90 capsule 1  . escitalopram (LEXAPRO) 10 MG tablet take 1 tablet by mouth once daily 90 tablet 1  . finasteride (PROSCAR) 5 MG tablet take 1 tablet by mouth once daily 90 tablet 1  . Insulin Pen Needle (NOVOFINE) 32G X 6  MM MISC Use for insulin injection 4 times per day 100 each 12  . lisinopril (PRINIVIL,ZESTRIL) 20 MG tablet take 1 tablet by mouth once daily 30 tablet 0  . metFORMIN (GLUCOPHAGE) 500 MG tablet take 2 tablets by mouth twice a day with food 360 tablet 1  . mometasone (NASONEX) 50 MCG/ACT nasal spray 2 sprays in each nostril once daily 17 g 11  . montelukast (SINGULAIR) 10 MG tablet Take 1 tablet (10 mg total) by mouth at bedtime. 30 tablet 11  . Naproxen Sodium (ALEVE) 220 MG CAPS Take 2 capsules by mouth at bedtime as needed.      . Omega-3 Fatty Acids (FISH OIL) 1000 MG CAPS Take 1 capsule by mouth 2 (two) times daily.    . ONE TOUCH ULTRA TEST test strip CHECK GLUCOSE 3 TIMES DAILY 100 each 11  . tamsulosin (FLOMAX) 0.4 MG CAPS capsule take 2 capsules by mouth at bedtime 180 capsule 1  . vitamin B-12 (CYANOCOBALAMIN) 1000 MCG tablet Take 1,000 mcg by mouth daily.      [name] LEVEMIR  FLEXTOUCH 100 UNIT/ML Pen INJECT 39 UNITS SUBCUTANEOUSLY AT BEDTIME. 15 mL 3  . NOVOLOG FLEXPEN 100 UNIT/ML FlexPen inject 5 units subcutaneously before meals 15 mL 1   No facility-administered medications prior to visit.     Allergies  Allergen Reactions  . Simvastatin Other (See Comments)    myalgias  . Antihistamines, Diphenhydramine-Type Other (See Comments)    depression  . Codeine Nausea Only  . Penicillins Hives  . Sulfa Antibiotics Hives    ROS As per HPI  PE: Blood pressure 122/74, pulse 80, temperature 97.7 F (36.5 C), temperature source Oral, resp. rate 16, height 5\' 10"  (1.778 m), weight 278 lb 12 oz (126.4 kg), SpO2 96 %. Gen: Alert, well appearing.  Patient is oriented to person, place, time, and situation. AFFECT: pleasant, lucid thought and speech. No further exam today.  LABS:  Lab Results  Component Value Date   TSH 1.46 05/21/2014   Lab Results  Component Value Date   WBC 6.2 05/21/2014   HGB 13.0 05/21/2014   HCT 39.1 05/21/2014   MCV 87.8 05/21/2014   PLT 211.0 05/21/2014   Lab Results  Component Value Date   CREATININE 0.94 01/18/2017   BUN 18 01/18/2017   NA 137 01/18/2017   K 4.2 01/18/2017   CL 102 01/18/2017   CO2 24 01/18/2017   Lab Results  Component Value Date   ALT 27 07/31/2016   AST 15 07/31/2016   ALKPHOS 47 07/31/2016   BILITOT 0.5 07/31/2016   Lab Results  Component Value Date   CHOL 181 01/18/2017   Lab Results  Component Value Date   HDL 40 (L) 01/18/2017   Lab Results  Component Value Date   LDLCALC 89 01/18/2017   Lab Results  Component Value Date   TRIG 258 (H) 01/18/2017   Lab Results  Component Value Date   CHOLHDL 4.5 01/18/2017   Lab Results  Component Value Date   PSA 0.03 (L) 07/04/2015   PSA 0.02 (L) 05/21/2014   PSA 0.03 (L) 05/10/2013   Lab Results  Component Value Date   HGBA1C 8.6 04/29/2017   IMPRESSION AND PLAN:  1) DM 2: poor control. POCT A1c today 8.6%. Increase levemir  to 48 U qhs and increase novolog to 15 U qAC. Eye exam UTD approx 12/2016--we'll get ophthalmologist's records. Refer pt to nutritionist for refresher on diabetic diet (per his request). F/u for  recheck DM in 1 mo.  2) Allergic rhinitis, not improved with any meds.  Pt requested referral to allergist today so I ordered this.  3) Grief response: grieving for the loss of his son appropriately.  Pt declines my offer to increase his lexapro. Emotional support given today.  An After Visit Summary was printed and given to the patient.  FOLLOW UP: Return in about 4 weeks (around 05/27/2017) for f/u DM 2.  Signed:  Crissie Sickles, MD           04/29/2017

## 2017-05-01 ENCOUNTER — Other Ambulatory Visit: Payer: Self-pay | Admitting: Family Medicine

## 2017-05-03 DIAGNOSIS — J309 Allergic rhinitis, unspecified: Secondary | ICD-10-CM | POA: Diagnosis not present

## 2017-05-03 DIAGNOSIS — R05 Cough: Secondary | ICD-10-CM | POA: Diagnosis not present

## 2017-05-05 ENCOUNTER — Encounter: Payer: Self-pay | Admitting: Family Medicine

## 2017-05-05 ENCOUNTER — Other Ambulatory Visit: Payer: Self-pay | Admitting: Family Medicine

## 2017-05-05 DIAGNOSIS — R0981 Nasal congestion: Secondary | ICD-10-CM

## 2017-05-05 MED ORDER — LISINOPRIL 20 MG PO TABS: 20.0000 mg | ORAL_TABLET | Freq: Every day | ORAL | 5 refills | Status: DC

## 2017-05-05 NOTE — Telephone Encounter (Signed)
Please advise. Thanks.  

## 2017-05-05 NOTE — Telephone Encounter (Signed)
[facility] [name] [name].  RF request for lisinopril LOV: 04/29/17 Next ov: 05/27/17 Last written: 04/16/17 #30 w/ 1OF

## 2017-05-05 NOTE — Telephone Encounter (Signed)
OK, referral to Dr. Benjamine Mola ordered.

## 2017-05-10 ENCOUNTER — Encounter: Payer: Self-pay | Admitting: Family Medicine

## 2017-05-27 ENCOUNTER — Encounter: Payer: Self-pay | Admitting: Registered"

## 2017-05-27 ENCOUNTER — Encounter: Payer: BLUE CROSS/BLUE SHIELD | Attending: Family Medicine | Admitting: Registered"

## 2017-05-27 ENCOUNTER — Ambulatory Visit: Payer: BLUE CROSS/BLUE SHIELD | Admitting: Family Medicine

## 2017-05-27 DIAGNOSIS — Z794 Long term (current) use of insulin: Secondary | ICD-10-CM | POA: Diagnosis not present

## 2017-05-27 DIAGNOSIS — Z713 Dietary counseling and surveillance: Secondary | ICD-10-CM | POA: Insufficient documentation

## 2017-05-27 DIAGNOSIS — E118 Type 2 diabetes mellitus with unspecified complications: Secondary | ICD-10-CM | POA: Diagnosis not present

## 2017-05-27 NOTE — Progress Notes (Signed)
Diabetes Self-Management Education  Visit Type: First/Initial  Appt. Start Time: 1400 Appt. End Time: 3810  05/27/2017  [name], identified by name and date of birth, is a 66 y.o. male with a diagnosis of Diabetes: Type 2.   ASSESSMENT Pt states he has not slept well in about 3 years. Pt reports he was told he has OSA and tried CPAP, but couldn't sleep with it. Pt states has not slept well for the last 4-5 months due to continual congestions. Pt states he has had allergy without any significant findings and will be seeing an ENT next. RD discussed sleep routines which may help with sleep issues.  Pt states he would forget to take insulin and metformin with meals and thought he should take after he started eating so would go without medication if not taken with meal. RD advised that he take the metformin regardless of timing of meal, but to check with his doctor to determine best strategy for the insulin.  At next visit RD plans to educate patient on types of fat.     Diabetes Self-Management Education - 05/27/17 1410      Visit Information   Visit Type First/Initial     Initial Visit   Diabetes Type Type 2   Are you currently following a meal plan? No   Are you taking your medications as prescribed? Yes  forgets to take meal time insulin   Date Diagnosed 2009     Health Coping   How would you rate your overall health? Poor     Psychosocial Assessment   Patient Belief/Attitude about Diabetes Defeat/Burnout   Other persons present Patient;Spouse/SO   How often do you need to have someone help you when you read instructions, pamphlets, or other written materials from your doctor or pharmacy? 1 - Never   What is the last grade level you completed in school? Law Degree     Complications   Last HgB A1C per patient/outside source 8.6 %  per chart 03/2017   How often do you check your blood sugar? 0 times/day (not testing)  dr wants him to check 4x day - but forgets   Number of hypoglycemic episodes per month 0   Have you had a dilated eye exam in the past 12 months? Yes   Have you had a dental exam in the past 12 months? Yes   Are you checking your feet? Yes   How many days per week are you checking your feet? 7     Dietary Intake   Breakfast none OR 4 deviled eggs, PB on English muffin with sugar free jam OR cereals, yogurt OR pancakes, sasauge, toast   Snack (morning) sweets, chips OR fruit or veggies   Lunch 1 & 1/2 sandwich sometimes on whole grain, ham or summer sausage   Snack (afternoon) same as morning   Dinner meat, veggies, fruit, carb sometimes, watermellon   Snack (evening) same less than    Beverage(s) water, diet soda, unsweet tea, wine 3x month, cocktail w sugarfree soda occassionally     Exercise   Exercise Type ADL's   How many days per week to you exercise? 0   How many minutes per day do you exercise? 0   Total minutes per week of exercise 0     Patient Education   Previous Diabetes Education Yes (please comment)  2009   Disease state  Definition of diabetes, type 1 and 2, and the diagnosis of diabetes  Nutrition management  Role of diet in the treatment of diabetes and the relationship between the three main macronutrients and blood glucose level;Food label reading, portion sizes and measuring food.;Carbohydrate counting;Reviewed blood glucose goals for pre and post meals and how to evaluate the patients' food intake on their blood glucose level.   Physical activity and exercise  Role of exercise on diabetes management, blood pressure control and cardiac health.   Medications Reviewed patients medication for diabetes, action, purpose, timing of dose and side effects.   Monitoring Identified appropriate SMBG and/or A1C goals.   Chronic complications Relationship between chronic complications and blood glucose control;Retinopathy and reason for yearly dilated eye exams     Individualized Goals (developed by patient)   Nutrition  Follow meal plan discussed   Physical Activity Exercise 5-7 days per week     Outcomes   Expected Outcomes Demonstrated interest in learning. Expect positive outcomes   Future DMSE 4-6 wks   Program Status Not Completed    Individualized Plan for Diabetes Self-Management Training:   Learning Objective:  Patient will have a greater understanding of diabetes self-management. Patient education plan is to attend individual and/or group sessions per assessed needs and concerns.  Patient Instructions  Plan:  Aim for 3-4 Carb Choices per meal (45-60 grams)   Aim for 0-2 Carbs per snack if hungry  Include protein in moderation with your meals and snacks Consider reading food labels for Total Carbohydrate  Consider daily activity daily as tolerated Consider checking BG at alternate times per day as directed by MD  Continue taking medication as directed by MD Contact your doctor about the continuous glucose monitoring Consider getting some bedtime routine to help with sleep   Expected Outcomes:  Demonstrated interest in learning. Expect positive outcomes  Education material provided: Living Well with Diabetes, A1C conversion sheet, My Plate and Carbohydrate counting sheet, Freestyle CGM brochure  If problems or questions, patient to contact team via:  Phone and MyChart  Future DSME appointment: 4-6 wks

## 2017-05-27 NOTE — Patient Instructions (Signed)
Plan:  Aim for 3-4 Carb Choices per meal (45-60 grams)   Aim for 0-2 Carbs per snack if hungry  Include protein in moderation with your meals and snacks Consider reading food labels for Total Carbohydrate  Consider daily activity daily as tolerated Consider checking BG at alternate times per day as directed by MD  Continue taking medication as directed by MD Contact your doctor about the continuous glucose monitoring Consider getting some bedtime routine to help with sleep

## 2017-05-28 DIAGNOSIS — J342 Deviated nasal septum: Secondary | ICD-10-CM | POA: Diagnosis not present

## 2017-05-28 DIAGNOSIS — J343 Hypertrophy of nasal turbinates: Secondary | ICD-10-CM | POA: Diagnosis not present

## 2017-06-21 ENCOUNTER — Ambulatory Visit (INDEPENDENT_AMBULATORY_CARE_PROVIDER_SITE_OTHER): Payer: BLUE CROSS/BLUE SHIELD | Admitting: Family Medicine

## 2017-06-21 ENCOUNTER — Encounter: Payer: Self-pay | Admitting: Family Medicine

## 2017-06-21 VITALS — BP 116/72 | HR 91 | Temp 98.1°F | Resp 16 | Ht 70.0 in | Wt 274.5 lb

## 2017-06-21 DIAGNOSIS — E119 Type 2 diabetes mellitus without complications: Secondary | ICD-10-CM

## 2017-06-21 MED ORDER — INSULIN DETEMIR 100 UNIT/ML FLEXPEN: PEN_INJECTOR | SUBCUTANEOUS | 6 refills | Status: DC

## 2017-06-21 MED ORDER — INSULIN ASPART 100 UNIT/ML FLEXPEN: PEN_INJECTOR | SUBCUTANEOUS | 2 refills | Status: DC

## 2017-06-21 NOTE — Patient Instructions (Signed)
Check your glucose fasting before breakfast AND also check it before your evening meal (supper).  For Levemir:  Increase dose by 2 units ever 5 days until fasting glucose is in the range of 100-110.  For novolog: Increase dose by 1  unit (at each dosing) every 5 days until your glucose before supper is 140-170 range.

## 2017-06-21 NOTE — Progress Notes (Signed)
OFFICE VISIT  06/21/2017   CC:  Chief Complaint  Patient presents with  . Follow-up    DM, pt is fasting.    HPI:    Patient is a 66 y.o. Caucasian male who presents for f/u DM 2 that has been poorly controlled. Last visit I increased his levemir to 48 U qhs and novolog to 15 U qAC and I also referred him to a nutritionist for refresher on diabetic diet.  He was supposed to f/u 1 mo from last visit but it has been 2 mo.  Chaotic life lately b/c packing up everything himself (with wife).  Stressed out, irritated by the situation.  Home glucose monitoring: not checking regularly--fastings 180-210. He has only been on our most recent dosing for 1 mo.  However, the last 1 week the glucoses have been "horrible again".  He saw the nutritionist and this was helpful.  He met with the allergist, says allergy testing revealed no allergies. I then referred him to an ENT MD and he is now scheduled for a bilat turbinate reduction to help with his chronic nasal congestion.    Past Medical History:  Diagnosis Date  . Allergic rhinitis, unspecified    Allergy testing via allergist 04/2017--mostly neg.  Dr. Donneta Romberg recommended referral to ENT so i did this.  [name] BPH (benign prostatic hypertrophy)   . DDD (degenerative disc disease), lumbar 2010   laminectomy 01/2009  . Depression    Hospitalized for suicidal ideation 31.  Has been on lexapro since 10/2008.  . Diabetes mellitus type II    Dx'd approximately 06/2008.  No retinopathy as of 02/2017 ophth.  . Erectile dysfunction   . History of colon polyps 01/2016   Recall 3 yrs  . History of pneumonia 2008   Hospitalized  . History of scarlet fever    age 37  . Hyperlipidemia, mixed 1993   myalgias on pravastatin  . Hypertension 2009   "marked chronic cardiomegaly" on CXR 01/2009 per old records.  . 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 Insomnia   . Keratoconus    dx'd 1973  . OSA (obstructive sleep apnea) 2004   intolerant of CPAP  . Osteoarthritis of both knees    Mild plain film changes in medial compartment bilat  . Rhinitis, chronic   . 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 APPENDECTOMY  1972  . COLONOSCOPY  X 4   Most recent was 2007 [place], [place]), with removal of polyps in the first two.  02/21/16: tubular adenoma.  Recall 3 yrs (Dr. Ardis Hughs).  . 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 ELECTROCARDIOGRAM  01/29/2009   NORMAL  . LUMBAR LAMINECTOMY  01/2009  . TONSILLECTOMY AND ADENOIDECTOMY     age 58  . URETHRAL DILATION     2 as a child, one as an adult  . VASECTOMY  1994    Outpatient Medications Prior to Visit  Medication Sig Dispense Refill  . aspirin 325 MG tablet Take 325 mg by mouth daily.      [name] atorvastatin (LIPITOR) 80 MG tablet take 1 tablet by mouth once daily (NEEDS APPOINTMENT) 30 tablet 5  . BD PEN NEEDLE NANO U/F 32G X 4 MM MISC USE FOR INSULIN INJECTION FOUR TIMES A DAY 100 each 12  . Choline Fenofibrate (FENOFIBRIC ACID)  135 MG CPDR take 1 capsule by mouth once daily 90 capsule 1  . escitalopram (LEXAPRO) 10 MG tablet take 1 tablet by mouth once daily 90 tablet 1  . finasteride (PROSCAR) 5 MG tablet take 1 tablet by mouth once daily 90 tablet 1  . lisinopril (PRINIVIL,ZESTRIL) 20 MG tablet Take 1 tablet (20 mg total) by mouth daily. 30 tablet 5  . MELATONIN PO Take by mouth.    . metFORMIN (GLUCOPHAGE) 500 MG tablet take 2 tablets by mouth twice a day with food 360 tablet 1  . mometasone (NASONEX) 50 MCG/ACT nasal spray 2 sprays in each nostril once daily 17 g 11  . montelukast (SINGULAIR) 10 MG tablet Take 1 tablet (10 mg total) by mouth at bedtime. 30 tablet 11  . Naproxen Sodium (ALEVE) 220 MG CAPS Take 2 capsules by mouth at bedtime as needed.      . Omega-3 Fatty Acids (FISH OIL) 1000 MG CAPS Take 1 capsule by mouth 2 (two) times daily.    . ONE TOUCH ULTRA TEST  test strip CHECK GLUCOSE 3 TIMES DAILY 100 each 11  . tamsulosin (FLOMAX) 0.4 MG CAPS capsule take 2 capsules by mouth at bedtime 180 capsule 1  . vitamin B-12 (CYANOCOBALAMIN) 1000 MCG tablet Take 1,000 mcg by mouth daily.      . insulin aspart (NOVOLOG FLEXPEN) 100 UNIT/ML FlexPen 15 U SQ q AC 15 mL 2  . Insulin Detemir (LEVEMIR FLEXTOUCH) 100 UNIT/ML Pen 48 U SQ qhs 15 mL 6  . Insulin Pen Needle (NOVOFINE) 32G X 6 MM MISC Use for insulin injection 4 times per day (Patient not taking: Reported on 06/21/2017) 100 each 12   No facility-administered medications prior to visit.     Allergies  Allergen Reactions  . Simvastatin Other (See Comments)    myalgias  . Antihistamines, Diphenhydramine-Type Other (See Comments)    depression  . Codeine Nausea Only  . Penicillins Hives  . Sulfa Antibiotics Hives    ROS As per HPI  PE: Blood pressure 116/72, pulse 91, temperature 98.1 F (36.7 C), temperature source Oral, resp. rate 16, height _0  (1.778 m), weight 274 lb 8 oz (124.5 kg), SpO2 96 %. Gen: Alert, well appearing.  Patient is oriented to person, place, time, and situation. AFFECT: pleasant, lucid thought and speech. No further exam today.  LABS:    Chemistry      Component Value Date/Time   NA 137 01/18/2017 0832   K 4.2 01/18/2017 0832   CL 102 01/18/2017 0832   CO2 24 01/18/2017 0832   BUN 18 01/18/2017 0832   CREATININE 0.94 01/18/2017 0832      Component Value Date/Time   CALCIUM 9.2 01/18/2017 0832   ALKPHOS 47 07/31/2016 1116   AST 15 07/31/2016 1116   ALT 27 07/31/2016 1116   BILITOT 0.5 07/31/2016 1116     Lab Results  Component Value Date   HGBA1C 8.6 04/29/2017   Lab Results  Component Value Date   CHOL 181 01/18/2017   HDL 40 (L) 01/18/2017   LDLCALC 89 01/18/2017   LDLDIRECT 112.0 07/31/2016   TRIG 258 (H) 01/18/2017   CHOLHDL 4.5 01/18/2017    IMPRESSION AND PLAN:  1) DM 2: poor control still. Complicated by hectic lifestyle lately but  he's working on diet. Plan: I will increase his levemir to 52 U qhs and novolog to 18 U qAC. Titration instructions: Check your glucose fasting before breakfast AND also check it before  your evening meal (supper).  For Levemir:  Increase dose by 2 units ever 5 days until fasting glucose is in the range of 100-110.  For novolog: Increase dose by 1  unit (at each dosing) every 5 days until your glucose before supper is 140-170 range.  No labs today.  An After Visit Summary was printed and given to the patient.  FOLLOW UP: Return in about 2 months (around 08/22/2017) for routine chronic illness f/u.   Signed:  Crissie Sickles, MD           06/21/2017

## 2017-07-02 ENCOUNTER — Other Ambulatory Visit: Payer: Self-pay | Admitting: Family Medicine

## 2017-07-08 ENCOUNTER — Encounter: Payer: BLUE CROSS/BLUE SHIELD | Attending: Family Medicine | Admitting: Registered"

## 2017-07-08 DIAGNOSIS — E118 Type 2 diabetes mellitus with unspecified complications: Secondary | ICD-10-CM | POA: Diagnosis not present

## 2017-07-08 DIAGNOSIS — Z713 Dietary counseling and surveillance: Secondary | ICD-10-CM | POA: Diagnosis not present

## 2017-07-08 DIAGNOSIS — Z794 Long term (current) use of insulin: Secondary | ICD-10-CM | POA: Diagnosis not present

## 2017-07-08 NOTE — Progress Notes (Signed)
Diabetes Self-Management Education  Visit Type: Follow-up  Appt. Start Time: 1400 Appt. End Time: 1430  07/08/2017  [name], identified by name and date of birth, is a 66 y.o. male with a diagnosis of Diabetes:  .   ASSESSMENT Pt returns with spouse. Pt states he is sleeping a little better, but may be due to being worn out from moving. Pt reports increased stress from the complications of his recent move and they estimate they have 2 weeks to get fully moved in. Pt states after move complete [facility]  Pt states his BG are still high but they are coming down. Pt reports that Dr. Ernestine Conrad said that the continuous glucose monitoring is something that would be done with endocrinologist because they would download the information.      Diabetes Self-Management Education - 07/08/17 1405      Visit Information   Visit Type Follow-up     Individualized Goals (developed by patient)   Nutrition Follow meal plan discussed  has cut back CHO & meat and increased veggies     Outcomes   Expected Outcomes Demonstrated interest in learning. Expect positive outcomes   Future DMSE PRN   Program Status Completed     Subsequent Visit   Since your last visit have you continued or begun to take your medications as prescribed? No  forgets but is getting better   Since your last visit have you had your blood pressure checked? Yes   Is your most recent blood pressure lower, unchanged, or higher since your last visit? Unchanged  stayed good even though has been a stressful time   Since your last visit, are you checking your blood glucose at least once a day? No  doesn't forget as much as he was before    Individualized Plan for Diabetes Self-Management Training:   Learning Objective:  Patient will have a greater understanding of diabetes self-management. Patient education plan is to attend individual and/or group sessions per assessed  needs and concerns.   Patient Instructions  Consider having Greek yogurt or something with protein to eat with fruit Be sure to ask your doctor about your balance concerns. Consider reading nutrition facts for types of fats and review handout Great plan to get back to the [facility]  Expected Outcomes:  Demonstrated interest in learning. Expect positive outcomes  Education material provided: Types of Fat, Cholesterol & Triglycerides  If problems or questions, patient to contact team via:  Phone and MyChart  Future DSME appointment: PRN

## 2017-07-08 NOTE — Patient Instructions (Addendum)
Consider having Greek yogurt or something with protein to eat with fruit Be sure to ask your doctor about your balance concerns. Consider reading nutrition facts for types of fats and review handout Great plan to get back to the [facility]

## 2017-08-06 ENCOUNTER — Encounter: Payer: Self-pay | Admitting: Family Medicine

## 2017-08-06 MED ORDER — METFORMIN HCL 500 MG PO TABS: ORAL_TABLET | ORAL | 1 refills | Status: DC

## 2017-08-24 ENCOUNTER — Other Ambulatory Visit: Payer: Self-pay | Admitting: Family Medicine

## 2017-08-30 ENCOUNTER — Encounter: Payer: Self-pay | Admitting: Family Medicine

## 2017-08-30 MED ORDER — TAMSULOSIN HCL 0.4 MG PO CAPS: 0.8000 mg | ORAL_CAPSULE | Freq: Every day | ORAL | 1 refills | Status: DC

## 2017-08-30 MED ORDER — ESCITALOPRAM OXALATE 10 MG PO TABS: 10.0000 mg | ORAL_TABLET | Freq: Every day | ORAL | 1 refills | Status: DC

## 2017-08-30 NOTE — Telephone Encounter (Signed)
Pt requesting refills.  RF request for escitalopram LOV: 06/21/17 Next ov: None Last written: 02/22/17 #90 w/ 1RF  RF request for Tramsulosin Last written: 02/15/17 #180 w/ 1RF  Please advise. Thanks.

## 2017-08-31 ENCOUNTER — Telehealth: Payer: Self-pay | Admitting: *Deleted

## 2017-08-31 DIAGNOSIS — J343 Hypertrophy of nasal turbinates: Secondary | ICD-10-CM | POA: Diagnosis not present

## 2017-08-31 MED ORDER — FENOFIBRIC ACID 135 MG PO CPDR: 1.0000 | DELAYED_RELEASE_CAPSULE | Freq: Every day | ORAL | 1 refills | Status: DC

## 2017-08-31 NOTE — Telephone Encounter (Signed)
Pt requested refill for fenofibric acid. Rx sent to [facility].

## 2017-09-03 ENCOUNTER — Telehealth: Payer: Self-pay | Admitting: *Deleted

## 2017-09-03 NOTE — Telephone Encounter (Signed)
We received a fax from [facility] stating that pt recently filled a Rx for montelukast sodium 10mg . There has been a recall from [employer]. Need to have pt look at this medication. have him describe it. If it is beige, rounded square-shaped, film coated tablets imprinted with I on one side and 114 on the other side. He has the correct medication, if not tell him to stop taking it and we will send in a new Rx to replace the one he has.   Left message for pt to call back.

## 2017-09-07 NOTE — Telephone Encounter (Signed)
SW pt and he stated that he received the letter too and his medication was fine.

## 2017-10-31 ENCOUNTER — Encounter: Payer: Self-pay | Admitting: Family Medicine

## 2017-11-01 MED ORDER — INSULIN PEN NEEDLE 32G X 4 MM MISC: 12 refills | Status: DC

## 2017-11-05 ENCOUNTER — Other Ambulatory Visit: Payer: Self-pay

## 2017-11-05 ENCOUNTER — Encounter: Payer: Self-pay | Admitting: Family Medicine

## 2017-11-05 ENCOUNTER — Ambulatory Visit (INDEPENDENT_AMBULATORY_CARE_PROVIDER_SITE_OTHER): Payer: BLUE CROSS/BLUE SHIELD | Admitting: Family Medicine

## 2017-11-05 VITALS — BP 110/69 | HR 82 | Temp 98.1°F | Resp 16 | Ht 70.0 in | Wt 283.0 lb

## 2017-11-05 DIAGNOSIS — M545 Low back pain, unspecified: Secondary | ICD-10-CM

## 2017-11-05 DIAGNOSIS — G8929 Other chronic pain: Secondary | ICD-10-CM

## 2017-11-05 DIAGNOSIS — E119 Type 2 diabetes mellitus without complications: Secondary | ICD-10-CM

## 2017-11-05 LAB — POCT GLYCOSYLATED HEMOGLOBIN (HGB A1C): Hemoglobin A1C: 7.1

## 2017-11-05 MED ORDER — INSULIN ASPART 100 UNIT/ML FLEXPEN: PEN_INJECTOR | SUBCUTANEOUS | 2 refills | Status: DC

## 2017-11-05 MED ORDER — INSULIN DETEMIR 100 UNIT/ML FLEXPEN: 60.0000 [IU] | PEN_INJECTOR | Freq: Every day | SUBCUTANEOUS | 3 refills | Status: DC

## 2017-11-05 NOTE — Progress Notes (Signed)
OFFICE VISIT  11/05/2017   CC:  Chief Complaint  Patient presents with  . Follow-up    RCI, pt is not fasting.   HPI:    Patient is a 66 y.o. Caucasian male who presents for f/u DM 2.  DM: Last visit we increased his Levemir 60 qhs, novolog 40 U qAC. Saw nutritionist since last visit--helpful. Home glucoses: avg fasting and 2H PP about 150.  Feeling lots of L LB pain w/out radiation or paresthesia.  Hurts mostly when he walks quite a bit. Also some unrelated R lateral thigh pain but only at the end of his day.  Says he will begin lots of stretching at the [facility] soon to try to help these things.  He avoids tylenol or NSAIDs--says he doesn't want to take meds chronically. He has hx of back surgery about 8 yrs ago and says if back doesn't improve much with adequate stretching/strengthing then he will need to get back to surgeon.  Past Medical History:  Diagnosis Date  . Allergic rhinitis, unspecified    Allergy testing via allergist 04/2017--mostly neg.  Dr. Donneta Romberg recommended referral to ENT so i did this.  [name] BPH (benign prostatic hypertrophy)   . DDD (degenerative disc disease), lumbar 2010   laminectomy 01/2009  . Depression    Hospitalized for suicidal ideation 76.  Has been on lexapro since 10/2008.  . Diabetes mellitus type II    Dx'd approximately 06/2008.  No retinopathy as of 02/2017 ophth.  . Erectile dysfunction   . History of colon polyps 01/2016   Recall 3 yrs  . History of pneumonia 2008   Hospitalized  . History of scarlet fever    age 33  . Hyperlipidemia, mixed 1993   myalgias on pravastatin  . Hypertension 2009   "marked chronic cardiomegaly" on CXR 01/2009 per old records.  . 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 Insomnia   . Keratoconus    dx'd 1973  . OSA (obstructive sleep apnea) 2004   intolerant of CPAP  . Osteoarthritis of both knees    Mild plain film changes in medial compartment bilat  . Rhinitis, chronic   . 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 APPENDECTOMY  1972  . COLONOSCOPY  X 4   Most recent was 2007 [place], [place]), with removal of polyps in the first two.  02/21/16: tubular adenoma.  Recall 3 yrs (Dr. Ardis Hughs).  . 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 ELECTROCARDIOGRAM  01/29/2009   NORMAL  . LUMBAR LAMINECTOMY  01/2009  . TONSILLECTOMY AND ADENOIDECTOMY     age 79  . URETHRAL DILATION     2 as a child, one as an adult  . VASECTOMY  1994    Outpatient Medications Prior to Visit  Medication Sig Dispense Refill  . aspirin 325 MG tablet Take 325 mg by mouth daily.      [name] atorvastatin (LIPITOR) 80 MG tablet take 1 tablet by mouth once daily 30 tablet 5  . Choline Fenofibrate (FENOFIBRIC ACID) 135 MG CPDR Take 1 capsule by mouth daily. 90 capsule 1  . escitalopram (LEXAPRO) 10 MG tablet Take 1 tablet (10 mg total) by mouth daily. 90 tablet 1  . finasteride (PROSCAR) 5 MG tablet take 1 tablet by mouth once daily 90 tablet 1  .  Insulin Pen Needle (BD PEN NEEDLE NANO U/F) 32G X 4 MM MISC USE FOR INSULIN INJECTION FOUR TIMES A DAY 100 each 12  . lisinopril (PRINIVIL,ZESTRIL) 20 MG tablet Take 1 tablet (20 mg total) by mouth daily. 30 tablet 5  . MELATONIN PO Take by mouth.    . metFORMIN (GLUCOPHAGE) 500 MG tablet take 2 tablets by mouth twice a day with food 360 tablet 1  . mometasone (NASONEX) 50 MCG/ACT nasal spray 2 sprays in each nostril once daily 17 g 11  . montelukast (SINGULAIR) 10 MG tablet Take 1 tablet (10 mg total) by mouth at bedtime. 30 tablet 11  . Naproxen Sodium (ALEVE) 220 MG CAPS Take 2 capsules by mouth at bedtime as needed.      . Omega-3 Fatty Acids (FISH OIL) 1000 MG CAPS Take 1 capsule by mouth 2 (two) times daily.    . ONE TOUCH ULTRA TEST test strip CHECK GLUCOSE 3 TIMES DAILY 100 each 11  . tamsulosin (FLOMAX) 0.4 MG CAPS capsule Take 2 capsules (0.8 mg total) by mouth at  bedtime. 180 capsule 1  . vitamin B-12 (CYANOCOBALAMIN) 1000 MCG tablet Take 1,000 mcg by mouth daily.      . insulin aspart (NOVOLOG FLEXPEN) 100 UNIT/ML FlexPen 18 U SQ q AC (Patient taking differently: Inject 40 Units into the skin 3 (three) times daily with meals. 18 U SQ q AC) 15 mL 2  . Insulin Detemir (LEVEMIR FLEXTOUCH) 100 UNIT/ML Pen 52 U SQ qhs (Patient taking differently: Inject 60 Units into the skin daily at 10 pm. 52 U SQ qhs) 15 mL 6   No facility-administered medications prior to visit.     Allergies  Allergen Reactions  . Simvastatin Other (See Comments)    myalgias  . Antihistamines, Diphenhydramine-Type Other (See Comments)    depression  . Codeine Nausea Only  . Penicillins Hives  . Sulfa Antibiotics Hives    ROS As per HPI  PE: Blood pressure 110/69, pulse 82, temperature 98.1 F (36.7 C), temperature source Oral, resp. rate 16, height 5\' 10"  (1.778 m), weight 283 lb (128.4 kg), SpO2 93 %. Gen: Alert, well appearing.  Patient is oriented to person, place, time, and situation. AFFECT: pleasant, lucid thought and speech. No further exam today.  LABS:  Lab Results  Component Value Date   TSH 1.46 05/21/2014   Lab Results  Component Value Date   WBC 6.2 05/21/2014   HGB 13.0 05/21/2014   HCT 39.1 05/21/2014   MCV 87.8 05/21/2014   PLT 211.0 05/21/2014   Lab Results  Component Value Date   CREATININE 0.94 01/18/2017   BUN 18 01/18/2017   NA 137 01/18/2017   K 4.2 01/18/2017   CL 102 01/18/2017   CO2 24 01/18/2017   Lab Results  Component Value Date   ALT 27 07/31/2016   AST 15 07/31/2016   ALKPHOS 47 07/31/2016   BILITOT 0.5 07/31/2016   Lab Results  Component Value Date   CHOL 181 01/18/2017   Lab Results  Component Value Date   HDL 40 (L) 01/18/2017   Lab Results  Component Value Date   LDLCALC 89 01/18/2017   Lab Results  Component Value Date   TRIG 258 (H) 01/18/2017   Lab Results  Component Value Date   CHOLHDL 4.5  01/18/2017   Lab Results  Component Value Date   PSA 0.03 (L) 07/04/2015   PSA 0.02 (L) 05/21/2014   PSA 0.03 (L) 05/10/2013  Lab Results  Component Value Date   HGBA1C 8.6 04/29/2017   POC HbA1c today= 7.1%  IMPRESSION AND PLAN:  DM 2: titrating insulins well, home gluc monitoring improved. POC HbA1c today= 7.1%.  Great improvement in control Continue to titrate insulins as he has been doing.   An After Visit Summary was printed and given to the patient.  FOLLOW UP: Return in about 3 months (around 02/03/2018) for annual CPE (fasting).   Signed:  Crissie Sickles, MD           11/05/2017

## 2017-11-30 DIAGNOSIS — I77810 Thoracic aortic ectasia: Secondary | ICD-10-CM

## 2017-11-30 HISTORY — DX: Thoracic aortic ectasia: I77.810

## 2017-12-03 DIAGNOSIS — J343 Hypertrophy of nasal turbinates: Secondary | ICD-10-CM | POA: Diagnosis not present

## 2017-12-31 ENCOUNTER — Encounter: Payer: Self-pay | Admitting: Family Medicine

## 2017-12-31 ENCOUNTER — Other Ambulatory Visit: Payer: Self-pay

## 2017-12-31 MED ORDER — LISINOPRIL 20 MG PO TABS: 20.0000 mg | ORAL_TABLET | Freq: Every day | ORAL | 1 refills | Status: DC

## 2017-12-31 NOTE — Telephone Encounter (Signed)
Refill sent to pharmacy.   

## 2018-01-04 ENCOUNTER — Encounter: Payer: Self-pay | Admitting: Family Medicine

## 2018-01-05 MED ORDER — FINASTERIDE 5 MG PO TABS: 5.0000 mg | ORAL_TABLET | Freq: Every day | ORAL | 0 refills | Status: DC

## 2018-01-05 MED ORDER — INSULIN ASPART 100 UNIT/ML FLEXPEN: PEN_INJECTOR | SUBCUTANEOUS | 1 refills | Status: DC

## 2018-01-31 ENCOUNTER — Other Ambulatory Visit: Payer: Self-pay | Admitting: Family Medicine

## 2018-01-31 NOTE — Telephone Encounter (Signed)
[facility] [address]  Cheral Almas request for lexapro LOV: 11/05/17 Next ov: 02/04/18 Last written: 08/30/17 #90 w/ 1RF  Please advise. Thanks.

## 2018-02-04 ENCOUNTER — Ambulatory Visit (INDEPENDENT_AMBULATORY_CARE_PROVIDER_SITE_OTHER): Payer: Medicare Other | Admitting: Family Medicine

## 2018-02-04 ENCOUNTER — Encounter: Payer: Self-pay | Admitting: Family Medicine

## 2018-02-04 VITALS — BP 129/75 | HR 83 | Temp 98.2°F | Resp 16 | Ht 70.0 in | Wt 282.0 lb

## 2018-02-04 DIAGNOSIS — I10 Essential (primary) hypertension: Secondary | ICD-10-CM | POA: Diagnosis not present

## 2018-02-04 DIAGNOSIS — E119 Type 2 diabetes mellitus without complications: Secondary | ICD-10-CM

## 2018-02-04 DIAGNOSIS — Z23 Encounter for immunization: Secondary | ICD-10-CM

## 2018-02-04 DIAGNOSIS — E7800 Pure hypercholesterolemia, unspecified: Secondary | ICD-10-CM

## 2018-02-04 DIAGNOSIS — G4733 Obstructive sleep apnea (adult) (pediatric): Secondary | ICD-10-CM

## 2018-02-04 LAB — LIPID PANEL
Cholesterol: 167 mg/dL (ref 0–200)
HDL: 50.3 mg/dL (ref >39.00)
LDL Cholesterol: 82 mg/dL (ref 0–99)
NonHDL: 116.49
Total CHOL/HDL Ratio: 3
Triglycerides: 171 mg/dL — ABNORMAL HIGH (ref 0.0–149.0)
VLDL: 34.2 mg/dL (ref 0.0–40.0)

## 2018-02-04 LAB — COMPREHENSIVE METABOLIC PANEL
ALT: 22 U/L (ref 0–53)
AST: 14 U/L (ref 0–37)
Albumin: 4.3 g/dL (ref 3.5–5.2)
Alkaline Phosphatase: 29 U/L — ABNORMAL LOW (ref 39–117)
BUN: 23 mg/dL (ref 6–23)
CO2: 30 mEq/L (ref 19–32)
Calcium: 9.8 mg/dL (ref 8.4–10.5)
Chloride: 99 mEq/L (ref 96–112)
Creatinine, Ser: 0.94 mg/dL (ref 0.40–1.50)
GFR: 85.57 mL/min (ref >60.00)
Glucose, Bld: 149 mg/dL — ABNORMAL HIGH (ref 70–99)
Potassium: 4.6 mEq/L (ref 3.5–5.1)
Sodium: 137 mEq/L (ref 135–145)
Total Bilirubin: 0.6 mg/dL (ref 0.2–1.2)
Total Protein: 6.6 g/dL (ref 6.0–8.3)

## 2018-02-04 LAB — HEMOGLOBIN A1C: Hgb A1c MFr Bld: 7.6 % — ABNORMAL HIGH (ref 4.6–6.5)

## 2018-02-04 NOTE — Progress Notes (Signed)
OFFICE VISIT  02/07/2018   CC:  Chief Complaint  Patient presents with  . Follow-up    RCI   HPI:    Patient is a 67 y.o. Caucasian male who presents with wife today for f/u DM, HTN, HLD.  DM2: Levemir 60 U qd, novolog 40 U qAC. He is not checking glucoses at home. Feet: no tingling or pain, but has some chronic numbness feeling on plantar aspects of both great toes, L>R. Says he is also forgetting doses of insulin.  He is not optimistic about what his labs may show today. No hypoglycemic spells. Feeling polydipsia and polyuria + nocturia.  Feeling sluggish and tired a lot of days. Not sleeping well.  Seems to have lots of dreams, cries out in sleep some.  He does have OSA but never tolerated the CPAP. Humidifier + saline spray + otc steroid nasal spray.  Has had turbinate reduction in the past.  Feels like he has poor concentration.  Clumsy more lately, dropping things, almost falls sometimes, gets angry easily. Memory difficulties occurring more lately.  Not navigating as well as normal lately. Excessive daytime somnolence.  Has chronic nasal issues: alt between congestion and rhinitis, affects his sleep.  HTN: no home monitoring lately.  Compliant with lisinopril. HLD: tolerating atorva 80mg  qd.  ROS: no HAs, no focal weakness, no dysarthria, no bradykinesia, no depression. No CP, SOB, palpitations, or dizziness.  Has chronic LBP.  Past Medical History:  Diagnosis Date  . Allergic rhinitis, unspecified    Allergy testing via allergist 04/2017--mostly neg.  Dr. Donneta Romberg recommended referral to ENT so i did this.  [name] BPH (benign prostatic hypertrophy)   . DDD (degenerative disc disease), lumbar 2010   laminectomy 01/2009  . Depression    Hospitalized for suicidal ideation 44.  Has been on lexapro since 10/2008.  . Diabetes mellitus type II    Dx'd approximately 06/2008.  No retinopathy as of 02/2017 ophth.  . Erectile dysfunction   . History of colon polyps 01/2016   Recall 3  yrs  . History of pneumonia 2008   Hospitalized  . History of scarlet fever    age 48  . Hyperlipidemia, mixed 1993   myalgias on pravastatin  . Hypertension 2009   "marked chronic cardiomegaly" on CXR 01/2009 per old records.  . 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 Insomnia   . Keratoconus    dx'd 1973  . OSA (obstructive sleep apnea) 2004   intolerant of CPAP  . Osteoarthritis of both knees    Mild plain film changes in medial compartment bilat  . Rhinitis, chronic   . 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 APPENDECTOMY  1972  . COLONOSCOPY  X 4   Most recent was 2007 [place], [place]), with removal of polyps in the first two.  02/21/16: tubular adenoma.  Recall 3 yrs (Dr. Ardis Hughs).  . 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 ELECTROCARDIOGRAM  01/29/2009   NORMAL  . LUMBAR LAMINECTOMY  01/2009  . TONSILLECTOMY AND ADENOIDECTOMY     age 75  . URETHRAL DILATION     2 as a child, one as an adult  . VASECTOMY  1994    Outpatient Medications Prior to Visit  Medication Sig Dispense Refill  . aspirin 325 MG tablet Take 325 mg by  mouth daily.      [name] atorvastatin (LIPITOR) 80 MG tablet take 1 tablet by mouth once daily 30 tablet 5  . Choline Fenofibrate (FENOFIBRIC ACID) 135 MG CPDR TAKE 1 CAPSULE BY MOUTH ONCE DAILY 90 capsule 1  . escitalopram (LEXAPRO) 10 MG tablet TAKE 1 TABLET BY MOUTH ONCE DAILY 90 tablet 3  . finasteride (PROSCAR) 5 MG tablet Take 1 tablet (5 mg total) by mouth daily. 90 tablet 0  . Insulin Pen Needle (BD PEN NEEDLE NANO U/F) 32G X 4 MM MISC USE FOR INSULIN INJECTION FOUR TIMES A DAY 100 each 12  . lisinopril (PRINIVIL,ZESTRIL) 20 MG tablet Take 1 tablet (20 mg total) by mouth daily. 90 tablet 1  . MELATONIN PO Take by mouth.    . metFORMIN (GLUCOPHAGE) 500 MG tablet take 2 tablets by mouth twice a day with food 360 tablet 1  . Naproxen  Sodium (ALEVE) 220 MG CAPS Take 2 capsules by mouth at bedtime as needed.      . Omega-3 Fatty Acids (FISH OIL) 1000 MG CAPS Take 1 capsule by mouth 2 (two) times daily.    . ONE TOUCH ULTRA TEST test strip CHECK GLUCOSE 3 TIMES DAILY 100 each 11  . tamsulosin (FLOMAX) 0.4 MG CAPS capsule TAKE 2 CAPSULES BY MOUTH AT BEDTIME 180 capsule 1  . vitamin B-12 (CYANOCOBALAMIN) 1000 MCG tablet Take 1,000 mcg by mouth daily.      . insulin aspart (NOVOLOG FLEXPEN) 100 UNIT/ML FlexPen 40 U SQ qAC 15 mL 1  . Insulin Detemir (LEVEMIR FLEXTOUCH) 100 UNIT/ML Pen Inject 60 Units into the skin daily at 10 pm. 52 U SQ qhs 5 pen 3  . mometasone (NASONEX) 50 MCG/ACT nasal spray 2 sprays in each nostril once daily (Patient not taking: Reported on 02/04/2018) 17 g 11  . montelukast (SINGULAIR) 10 MG tablet Take 1 tablet (10 mg total) by mouth at bedtime. (Patient not taking: Reported on 02/04/2018) 30 tablet 11   No facility-administered medications prior to visit.     Allergies  Allergen Reactions  . Simvastatin Other (See Comments)    myalgias  . Antihistamines, Diphenhydramine-Type Other (See Comments)    depression  . Codeine Nausea Only  . Penicillins Hives  . Sulfa Antibiotics Hives    ROS As per HPI  PE: Blood pressure 129/75, pulse 83, temperature 98.2 F (36.8 C), temperature source Oral, resp. rate 16, height 5\' 10"  (1.778 m), weight 282 lb (127.9 kg), SpO2 95 %. Body mass index is 40.46 kg/m.  Gen: Alert, well appearing.  Patient is oriented to person, place, time, and situation. AFFECT: pleasant, lucid thought and speech. Foot exam - bilateral normal; no swelling, tenderness or skin or vascular lesions. Color and temperature is normal. Sensation is intact. Peripheral pulses are palpable. Toenails are normal. Neuro: CN 2-12 intact bilaterally, strength 5/5 in proximal and distal upper extremities and lower extremities bilaterally.   No tremor.  No disdiadochokinesis.  No ataxia.  Upper extremity  and lower extremity DTRs symmetric.  No pronator drift.   LABS:  Lab Results  Component Value Date   TSH 1.46 05/21/2014   Lab Results  Component Value Date   WBC 6.2 05/21/2014   HGB 13.0 05/21/2014   HCT 39.1 05/21/2014   MCV 87.8 05/21/2014   PLT 211.0 05/21/2014   Lab Results  Component Value Date   CREATININE 0.94 02/04/2018   BUN 23 02/04/2018   NA 137 02/04/2018   K 4.6 02/04/2018  CL 99 02/04/2018   CO2 30 02/04/2018   Lab Results  Component Value Date   ALT 22 02/04/2018   AST 14 02/04/2018   ALKPHOS 29 (L) 02/04/2018   BILITOT 0.6 02/04/2018   Lab Results  Component Value Date   CHOL 167 02/04/2018   Lab Results  Component Value Date   HDL 50.30 02/04/2018   Lab Results  Component Value Date   LDLCALC 82 02/04/2018   Lab Results  Component Value Date   TRIG 171.0 (H) 02/04/2018   Lab Results  Component Value Date   CHOLHDL 3 02/04/2018   Lab Results  Component Value Date   PSA 0.03 (L) 07/04/2015   PSA 0.02 (L) 05/21/2014   PSA 0.03 (L) 05/10/2013   Lab Results  Component Value Date   HGBA1C 7.6 (H) 02/04/2018    IMPRESSION AND PLAN:  1) DM 2: increase levemir to 60 U qhs and increase novolog to 50 U qAC. Feet exam normal today. Prevnar 13 today.  2) HTN: The current medical regimen is effective;  continue present plan and medications. Lytes/cr today.  3) HLD: tolerating statin. FLP today.  4) Excessive daytime somnolence, poor concentration, irritability, memory impairment: all very likely coming from untreated OSA. Pt agreeable to referral to get further eval :  Likely repeat sleep study and retry PAP if sleep study + for OSA again.  An After Visit Summary was printed and given to the patient.  FOLLOW UP: Return in about 3 months (around 05/07/2018) for routine chronic illness f/u (30 min).  Signed:  Crissie Sickles, MD           02/07/2018

## 2018-02-07 ENCOUNTER — Telehealth: Payer: Self-pay | Admitting: Family Medicine

## 2018-02-07 MED ORDER — INSULIN ASPART 100 UNIT/ML FLEXPEN: PEN_INJECTOR | SUBCUTANEOUS | 1 refills | Status: DC

## 2018-02-07 MED ORDER — INSULIN DETEMIR 100 UNIT/ML FLEXPEN: 60.0000 [IU] | PEN_INJECTOR | Freq: Every day | SUBCUTANEOUS | 3 refills | Status: DC

## 2018-02-07 NOTE — Telephone Encounter (Signed)
Copied from [place] [id number]. Topic: Quick Communication - Rx Refill/Question >> Feb 07, 2018  2:01 PM Oliver Pila B wrote: Medication: insulin aspart (NOVOLOG FLEXPEN) 100 UNIT/ML FlexPen [[id number]] , Insulin Detemir (LEVEMIR FLEXTOUCH) 100 UNIT/ML Pen [[id number]]    Has the patient contacted their pharmacy? Yes.     (Agent: If no, request that the patient contact the pharmacy for the refill.)   Preferred Pharmacy (with phone number or street name): [facility]   Agent: Please be advised that RX refills may take up to 3 business days. We ask that you follow-up with your pharmacy.

## 2018-02-07 NOTE — Telephone Encounter (Signed)
LOV 02-04-2018 with Dr. Anitra Lauth.  HgbA1C on 02/04/2018 / Novolog Flex pen and Levemir Flextouch pen request / Refilled per protocol /

## 2018-02-10 ENCOUNTER — Encounter (INDEPENDENT_AMBULATORY_CARE_PROVIDER_SITE_OTHER): Payer: Medicare Other

## 2018-02-13 ENCOUNTER — Other Ambulatory Visit: Payer: Self-pay | Admitting: Family Medicine

## 2018-02-14 ENCOUNTER — Encounter (INDEPENDENT_AMBULATORY_CARE_PROVIDER_SITE_OTHER): Payer: Self-pay | Admitting: Family Medicine

## 2018-02-14 ENCOUNTER — Ambulatory Visit (INDEPENDENT_AMBULATORY_CARE_PROVIDER_SITE_OTHER): Payer: Medicare Other | Admitting: Family Medicine

## 2018-02-14 VITALS — BP 109/58 | HR 89 | Temp 97.9°F | Ht 69.0 in | Wt 272.0 lb

## 2018-02-14 DIAGNOSIS — Z1331 Encounter for screening for depression: Secondary | ICD-10-CM

## 2018-02-14 DIAGNOSIS — E1165 Type 2 diabetes mellitus with hyperglycemia: Secondary | ICD-10-CM

## 2018-02-14 DIAGNOSIS — R9431 Abnormal electrocardiogram [ECG] [EKG]: Secondary | ICD-10-CM

## 2018-02-14 DIAGNOSIS — R5383 Other fatigue: Secondary | ICD-10-CM | POA: Insufficient documentation

## 2018-02-14 DIAGNOSIS — Z0289 Encounter for other administrative examinations: Secondary | ICD-10-CM

## 2018-02-14 DIAGNOSIS — Z6841 Body Mass Index (BMI) 40.0 and over, adult: Secondary | ICD-10-CM

## 2018-02-14 DIAGNOSIS — Z794 Long term (current) use of insulin: Secondary | ICD-10-CM | POA: Diagnosis not present

## 2018-02-14 DIAGNOSIS — R0602 Shortness of breath: Secondary | ICD-10-CM | POA: Insufficient documentation

## 2018-02-15 LAB — COMPREHENSIVE METABOLIC PANEL
ALT: 23 IU/L (ref 0–44)
AST: 16 IU/L (ref 0–40)
Albumin/Globulin Ratio: 1.8 (ref 1.2–2.2)
Albumin: 4.4 g/dL (ref 3.6–4.8)
Alkaline Phosphatase: 35 IU/L — ABNORMAL LOW (ref 39–117)
BUN/Creatinine Ratio: 26 — ABNORMAL HIGH (ref 10–24)
BUN: 26 mg/dL (ref 8–27)
Bilirubin Total: 0.4 mg/dL (ref 0.0–1.2)
CO2: 24 mmol/L (ref 20–29)
Calcium: 10.2 mg/dL (ref 8.6–10.2)
Chloride: 97 mmol/L (ref 96–106)
Creatinine, Ser: 1 mg/dL (ref 0.76–1.27)
GFR calc Af Amer: 91 mL/min/{1.73_m2} (ref >59)
GFR calc non Af Amer: 79 mL/min/{1.73_m2} (ref >59)
Globulin, Total: 2.4 g/dL (ref 1.5–4.5)
Glucose: 172 mg/dL — ABNORMAL HIGH (ref 65–99)
Potassium: 4.8 mmol/L (ref 3.5–5.2)
Sodium: 136 mmol/L (ref 134–144)
Total Protein: 6.8 g/dL (ref 6.0–8.5)

## 2018-02-15 LAB — CBC WITH DIFFERENTIAL
Basophils Absolute: 0 10*3/uL (ref 0.0–0.2)
Basos: 1 %
EOS (ABSOLUTE): 0.2 10*3/uL (ref 0.0–0.4)
Eos: 3 %
Hematocrit: 39.3 % (ref 37.5–51.0)
Hemoglobin: 13.4 g/dL (ref 13.0–17.7)
Immature Grans (Abs): 0 10*3/uL (ref 0.0–0.1)
Immature Granulocytes: 0 %
Lymphocytes Absolute: 2.5 10*3/uL (ref 0.7–3.1)
Lymphs: 35 %
MCH: 28.9 pg (ref 26.6–33.0)
MCHC: 34.1 g/dL (ref 31.5–35.7)
MCV: 85 fL (ref 79–97)
Monocytes Absolute: 0.5 10*3/uL (ref 0.1–0.9)
Monocytes: 7 %
Neutrophils Absolute: 3.9 10*3/uL (ref 1.4–7.0)
Neutrophils: 54 %
RBC: 4.63 x10E6/uL (ref 4.14–5.80)
RDW: 14.1 % (ref 12.3–15.4)
WBC: 7.2 10*3/uL (ref 3.4–10.8)

## 2018-02-15 LAB — MICROALBUMIN / CREATININE URINE RATIO
Creatinine, Urine: 135.9 mg/dL
Microalb/Creat Ratio: <2.2 mg/g creat (ref 0.0–30.0)
Microalbumin, Urine: <3 ug/mL

## 2018-02-15 LAB — T4, FREE: Free T4: 1.44 ng/dL (ref 0.82–1.77)

## 2018-02-15 LAB — TSH: TSH: 2.03 u[IU]/mL (ref 0.450–4.500)

## 2018-02-15 LAB — T3: T3, Total: 108 ng/dL (ref 71–180)

## 2018-02-15 NOTE — Progress Notes (Signed)
.  Office: [phone]  /  Fax: [phone]   HPI:   Chief Complaint: OBESITY  [name] [name] (MR# [id number]) is a 67 y.o. male who presents on 02/14/2018 for obesity evaluation and treatment. Current BMI is Body mass index is 40.17 kg/m.[name] Reason has struggled with obesity for years and has been unsuccessful in either losing weight or maintaining long term weight loss. [name] [name] attended our information session and states he is currently in the action stage of change and ready to dedicate time achieving and maintaining a healthier weight.   [name] [name] states his family eats meals together he thinks his family will eat healthier with  him his desired weight loss is 72 lbs he started gaining weight in mid 1980's his heaviest weight ever was 280 lbs he has significant food cravings issues  he snacks frequently in the evenings he wakes up frquently in the middle of the night to eat he skips meals frequently he is frequently drinking liquids with calories he frequently makes poor food choices he frequently eats larger portions than normal  he struggles with emotional eating    Fatigue [name] [name] feels his energy is lower than it should be. This has worsened with weight gain and has not worsened recently. [name] [name] admits to daytime somnolence and  admits to waking up still tired. Patient is at risk for obstructive sleep apnea. Patent has a history of symptoms of daytime fatigue and morning headache. Patient generally gets 5 hours of sleep per night, and states they generally have nightime awakenings. Snoring is present. Apneic episodes are present. Epworth Sleepiness Score is 8  Dyspnea on exertion [name] [name] increasing shortness of breath with exercising and seems to be worsening over time with weight gain. He [name] getting out of breath sooner with activity than he used to. This has not gotten worse recently. [name] [name] denies orthopnea.  Diabetes II [name] [name] has a diagnosis of diabetes type II.  [name] [name] is metformin and Levemir 50 units, and has been out of Novolog for 1 week. Fasting BGs range between 160 and 170's and 2 hour post prandial range between 150 and 250 uncontrolled with hyperglycemia. He denies any hypoglycemic episodes. Last A1c was 7.6 on 02/04/18. He has been working on intensive lifestyle modifications including diet, exercise, and weight loss to help control his blood glucose levels.  Abnormal EKG [name] [name] EKG shows left ventricular hypertrophy with shortness of breath with exercise and new onset systolic murmur. He denies chest pain or orthopnea.  Depression Screen [name] [name] Food and Mood (modified PHQ-9) score was  Depression screen PHQ 2/9 02/14/2018  Decreased Interest 2  Down, Depressed, Hopeless 1  PHQ - 2 Score 3  Altered sleeping 1  Tired, decreased energy 3  Change in appetite 2  Feeling bad or failure about yourself  1  Trouble concentrating 3  Moving slowly or fidgety/restless 1  Suicidal thoughts 0  PHQ-9 Score 14  Difficult doing work/chores Not difficult at all    ALLERGIES: Allergies  Allergen Reactions  . Simvastatin Other (See Comments)    myalgias  . Antihistamines, Diphenhydramine-Type Other (See Comments)    depression  . Codeine Nausea Only  . Penicillins Hives  . Sulfa Antibiotics Hives    MEDICATIONS: Current Outpatient Medications on File Prior to Visit  Medication Sig Dispense Refill  . albuterol (PROVENTIL HFA;VENTOLIN HFA) 108 (90 Base) MCG/ACT inhaler Inhale into the lungs every 6 (six) hours as needed for wheezing or shortness of breath.    [name] aspirin 325 MG tablet  Take 325 mg by mouth daily.      [name] atorvastatin (LIPITOR) 80 MG tablet take 1 tablet by mouth once daily 30 tablet 5  . Choline Fenofibrate (FENOFIBRIC ACID) 135 MG CPDR TAKE 1 CAPSULE BY MOUTH ONCE DAILY 90 capsule 1  . dextromethorphan-guaiFENesin (MUCINEX DM) 30-600 MG 12hr tablet Take 1 tablet by mouth 2 (two) times daily as needed for cough.    . escitalopram  (LEXAPRO) 10 MG tablet TAKE 1 TABLET BY MOUTH ONCE DAILY 90 tablet 3  . finasteride (PROSCAR) 5 MG tablet Take 1 tablet (5 mg total) by mouth daily. 90 tablet 0  . fluticasone (FLONASE) 50 MCG/ACT nasal spray Place into both nostrils daily.    . insulin aspart (NOVOLOG FLEXPEN) 100 UNIT/ML FlexPen 40 U SQ qAC 15 mL 1  . Insulin Detemir (LEVEMIR FLEXTOUCH) 100 UNIT/ML Pen Inject 60 Units into the skin daily at 10 pm. 52 U SQ qhs 5 pen 3  . Insulin Pen Needle (BD PEN NEEDLE NANO U/F) 32G X 4 MM MISC USE FOR INSULIN INJECTION FOUR TIMES A DAY 100 each 12  . lisinopril (PRINIVIL,ZESTRIL) 20 MG tablet Take 1 tablet (20 mg total) by mouth daily. 90 tablet 1  . MELATONIN PO Take by mouth.    . Naproxen Sodium (ALEVE) 220 MG CAPS Take 2 capsules by mouth at bedtime as needed.      . Omega-3 Fatty Acids (FISH OIL) 1000 MG CAPS Take 1 capsule by mouth 2 (two) times daily.    . ONE TOUCH ULTRA TEST test strip CHECK GLUCOSE 3 TIMES DAILY 100 each 11  . SALINE NASAL SPRAY NA Place into the nose daily as needed.    . tamsulosin (FLOMAX) 0.4 MG CAPS capsule TAKE 2 CAPSULES BY MOUTH AT BEDTIME 180 capsule 1  . vitamin B-12 (CYANOCOBALAMIN) 1000 MCG tablet Take 1,000 mcg by mouth daily.      . metFORMIN (GLUCOPHAGE) 500 MG tablet TAKE 2 TABLETS BY MOUTH TWICE A DAY WITH FOOD 360 tablet 1   No current facility-administered medications on file prior to visit.     PAST MEDICAL HISTORY: Past Medical History:  Diagnosis Date  . Allergic rhinitis, unspecified    Allergy testing via allergist 04/2017--mostly neg.  Dr. Donneta Romberg recommended referral to ENT so i did this.  . Back pain   . BPH (benign prostatic hypertrophy)   . Chronic nasal congestion   . DDD (degenerative disc disease), lumbar 2010   laminectomy 01/2009  . Depression    Hospitalized for suicidal ideation 76.  Has been on lexapro since 10/2008.  . Diabetes mellitus type II    Dx'd approximately 06/2008.  No retinopathy as of 02/2017 ophth.  .  Erectile dysfunction   . Erectile dysfunction   . History of colon polyps 01/2016   Recall 3 yrs  . History of pneumonia 2008   Hospitalized  . History of scarlet fever    age 62  . Hyperlipidemia, mixed 1993   myalgias on pravastatin  . Hypertension 2009   "marked chronic cardiomegaly" on CXR 01/2009 per old records.  . 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 Insomnia   . Keratoconus    dx'd 1973  . Memory changes   . OSA (obstructive sleep apnea) 2004   intolerant of CPAP  . Osteoarthritis of both knees    Mild plain film changes in medial compartment bilat  . Restless leg   . Rhinitis, chronic   .  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 APPENDECTOMY  1972  . COLONOSCOPY  X 4   Most recent was 2007 [place], [place]), with removal of polyps in the first two.  02/21/16: tubular adenoma.  Recall 3 yrs (Dr. Ardis Hughs).  . 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 ELECTROCARDIOGRAM  01/29/2009   NORMAL  . LUMBAR LAMINECTOMY  01/2009  . TONSILLECTOMY AND ADENOIDECTOMY     age 89  . URETHRAL DILATION     2 as a child, one as an adult  . VASECTOMY  1994    SOCIAL HISTORY: Social History   Tobacco Use  . Smoking status: Former Research scientist (life sciences)  . Smokeless tobacco: Never Used  Substance Use Topics  . Alcohol use: Yes    Comment: social, 3-4 drinks month  . Drug use: No    FAMILY HISTORY: Family History  Problem Relation Age of Onset  . Heart disease Mother   . Hyperlipidemia Mother   . Stroke Mother   . Diabetes Mother   . Depression Mother   . Alzheimer's disease Mother   . Stroke Father   . Hyperlipidemia Father   . Heart disease Father   . Diabetes Father   . Hypertension Father   . Obesity Father   . Alcohol abuse Sister   . Depression Sister     ROS: Review of Systems  Constitutional: Positive for chills (occasional at  night), fever (occasional at night) and malaise/fatigue. Negative for weight loss.       + Trouble sleeping  HENT: Positive for sinus pain.        + Dry mouth + Nasal stuffiness  Eyes:       + Vision changes + Wear glasses or contacts + Floaters  Respiratory: Positive for cough and shortness of breath (with exertion).        + Painful or difficulty breathing  Cardiovascular: Negative for orthopnea.       + Chest tightness + Difficulty breathing while lying down (on back) + Sudden awakening from sleep with shortness of breath (on back) + Leg cramping (at night) + Very cold feet or hands (feet occasional)  Gastrointestinal: Positive for nausea.       + Swallowing difficulty  Genitourinary: Positive for frequency.  Musculoskeletal: Positive for back pain and neck pain.       + Neck stiffness + Muscle or Joint pain + Muscle stiffness  Skin:       + Dryness  Neurological: Positive for dizziness.  Endo/Heme/Allergies:       Negative hypoglycemia Positive hyperglycemia  Psychiatric/Behavioral: Positive for depression. Negative for suicidal ideas. The patient is nervous/anxious.        + Stress    PHYSICAL EXAM: Blood pressure (!) 109/58, pulse 89, temperature 97.9 F (36.6 C), temperature source Oral, height 5\' 9"  (1.753 m), weight 272 lb (123.4 kg), SpO2 93 %. Body mass index is 40.17 kg/m. Physical Exam  Constitutional: He is oriented to person, place, and time. He appears well-developed and well-nourished.  HENT:  Head: Normocephalic and atraumatic.  Nose: Nose normal.  Eyes: EOM are normal. No scleral icterus.  Neck: Normal range of motion. Neck supple. No thyromegaly present.  Cardiovascular: Normal rate and regular rhythm.  Murmur (2/6 early systolic murmur) heard. Pulmonary/Chest: Effort normal. No respiratory distress.  Abdominal: Soft. There  is no tenderness.  + Obesity  Musculoskeletal:  Range of Motion normal in all 4 extremities Trace edema noted in bilateral  lower extremities  Neurological: He is alert and oriented to person, place, and time. Coordination normal.  Skin: Skin is warm and dry.  Psychiatric: He has a normal mood and affect. His behavior is normal.  Vitals reviewed.   RECENT LABS AND TESTS: BMET    Component Value Date/Time   NA 136 02/14/2018 0848   K 4.8 02/14/2018 0848   CL 97 02/14/2018 0848   CO2 24 02/14/2018 0848   GLUCOSE 172 (H) 02/14/2018 0848   GLUCOSE 149 (H) 02/04/2018 1007   BUN 26 02/14/2018 0848   CREATININE 1.00 02/14/2018 0848   CREATININE 0.94 01/18/2017 0832   CALCIUM 10.2 02/14/2018 0848   GFRNONAA 79 02/14/2018 0848   GFRAA 91 02/14/2018 0848   Lab Results  Component Value Date   HGBA1C 7.6 (H) 02/04/2018   No results found for: INSULIN CBC    Component Value Date/Time   WBC 7.2 02/14/2018 0848   WBC 6.2 05/21/2014 1541   RBC 4.63 02/14/2018 0848   RBC 4.45 05/21/2014 1541   HGB 13.4 02/14/2018 0848   HCT 39.3 02/14/2018 0848   PLT 211.0 05/21/2014 1541   MCV 85 02/14/2018 0848   MCH 28.9 02/14/2018 0848   MCHC 34.1 02/14/2018 0848   MCHC 33.3 05/21/2014 1541   RDW 14.1 02/14/2018 0848   LYMPHSABS 2.5 02/14/2018 0848   MONOABS 0.4 05/21/2014 1541   EOSABS 0.2 02/14/2018 0848   BASOSABS 0.0 02/14/2018 0848   Iron/TIBC/Ferritin/ %Sat No results found for: IRON, TIBC, FERRITIN, IRONPCTSAT Lipid Panel     Component Value Date/Time   CHOL 167 02/04/2018 1007   TRIG 171.0 (H) 02/04/2018 1007   HDL 50.30 02/04/2018 1007   CHOLHDL 3 02/04/2018 1007   VLDL 34.2 02/04/2018 1007   LDLCALC 82 02/04/2018 1007   LDLDIRECT 112.0 07/31/2016 1116   Hepatic Function Panel     Component Value Date/Time   PROT 6.8 02/14/2018 0848   ALBUMIN 4.4 02/14/2018 0848   AST 16 02/14/2018 0848   ALT 23 02/14/2018 0848   ALKPHOS 35 (L) 02/14/2018 0848   BILITOT 0.4 02/14/2018 0848      Component Value Date/Time   TSH 2.030 02/14/2018 0848   Vitamin D No recent labs  ECG  shows NSR with a  rate of 90 BPM INDIRECT CALORIMETER done today shows a VO2 of 358 and a REE of 2493. His calculated basal metabolic rate is 1610 thus his basal metabolic rate is better than expected.    ASSESSMENT AND PLAN: Other fatigue - Plan: EKG 12-Lead, CBC With Differential, T3, T4, free, TSH  Shortness of breath on exertion - Plan: CBC With Differential  Type 2 diabetes mellitus with hyperglycemia, with long-term current use of insulin (HCC) - Plan: Comprehensive metabolic panel, Microalbumin / creatinine urine ratio  Abnormal EKG - Plan: ECHOCARDIOGRAM COMPLETE  Depression screening  Class 3 severe obesity with serious comorbidity and body mass index (BMI) of 40.0 to 44.9 in adult, unspecified obesity type ([place])  PLAN:  Fatigue Thedore was informed that his fatigue may be related to obesity, depression or many other causes. Labs will be ordered, and in the meanwhile Peregrine has agreed to work on diet, exercise and weight loss to help with fatigue. Proper sleep hygiene was discussed including the need for 7-8 hours of quality sleep each night. A sleep study was not  ordered based on symptoms and Epworth score.  Dyspnea on exertion Idan's shortness of breath appears to be obesity related and exercise induced. He has agreed to work on weight loss and gradually increase exercise to treat his exercise induced shortness of breath. If Cy follows our instructions and loses weight without improvement of his shortness of breath, we will plan to refer to pulmonology. We will monitor this condition regularly. Bashar agrees to this plan.  Diabetes II Jesse has been given extensive diabetes education by myself today including ideal fasting and post-prandial blood glucose readings, individual ideal Hgb A1c goals and hypoglycemia prevention. We discussed the importance of good blood sugar control to decrease the likelihood of diabetic complications such as nephropathy, neuropathy, limb loss, blindness,  coronary artery disease, and death. We discussed the importance of intensive lifestyle modification including diet, exercise and weight loss as the first line treatment for diabetes. He will continue diet and exercise, check BGs BID, and Dierks agrees to continue his diabetes medications. We will check urine MAB and Jafeth agrees to follow up with our clinic in 2 weeks.  Abnormal EKG We have sent a referral for an echocardiogram to [place] at [facility]. Yassen agrees to follow up with our clinic in 2 weeks.  Depression Screen Shayan had a moderately positive depression screening. Depression is commonly associated with obesity and often results in emotional eating behaviors. We will monitor this closely and work on CBT to help improve the non-hunger eating patterns. Referral to Psychology may be required if no improvement is seen as he continues in our clinic.  Obesity Chares is currently in the action stage of change and his goal is to continue with weight loss efforts He has agreed to follow the Category 4 plan Wynston has been instructed to work up to a goal of 150 minutes of combined cardio and strengthening exercise per week for weight loss and overall health benefits. We discussed the following Behavioral Modification Strategies today: increasing lean protein intake and work on meal planning and easy cooking plans  Jakorey has agreed to follow up with our clinic in 2 weeks. He was informed of the importance of frequent follow up visits to maximize his success with intensive lifestyle modifications for his multiple health conditions. He was informed we would discuss his lab results at his next visit unless there is a critical issue that needs to be addressed sooner. Gresham agreed to keep his next visit at the agreed upon time to discuss these results.    OBESITY BEHAVIORAL INTERVENTION VISIT  Today's visit was # 1 out of 22.  Starting weight: 272 lbs Starting date: 02/14/18 Today's  weight : 272 lbs Today's date: 02/14/2018 Total lbs lost to date: 0 (Patients must lose 7 lbs in the first 6 months to continue with counseling)   ASK: We discussed the diagnosis of obesity with Margarita Rana today and Rodrecus agreed to give [name] permission to discuss obesity behavioral modification therapy today.  ASSESS: Rueben has the diagnosis of obesity and his BMI today is 40.15 Hesston is in the action stage of change   ADVISE: Jaquez was educated on the multiple health risks of obesity as well as the benefit of weight loss to improve his health. He was advised of the need for long term treatment and the importance of lifestyle modifications.  AGREE: Multiple dietary modification options and treatment options were discussed and  Alfard agreed to the above obesity treatment plan.   Wilhemena Durie, am acting  as transcriptionist for Dennard Nip, MD   I have reviewed the above documentation for accuracy and completeness, and I agree with the above. -Dennard Nip, MD

## 2018-02-22 ENCOUNTER — Other Ambulatory Visit: Payer: Self-pay

## 2018-02-22 ENCOUNTER — Ambulatory Visit (HOSPITAL_COMMUNITY): Payer: Medicare Other | Attending: Cardiology

## 2018-02-22 DIAGNOSIS — I5030 Unspecified diastolic (congestive) heart failure: Secondary | ICD-10-CM | POA: Insufficient documentation

## 2018-02-22 DIAGNOSIS — I420 Dilated cardiomyopathy: Secondary | ICD-10-CM | POA: Insufficient documentation

## 2018-02-22 DIAGNOSIS — R9431 Abnormal electrocardiogram [ECG] [EKG]: Secondary | ICD-10-CM

## 2018-02-22 HISTORY — PX: TRANSTHORACIC ECHOCARDIOGRAM: SHX275

## 2018-02-28 ENCOUNTER — Other Ambulatory Visit: Payer: Self-pay | Admitting: Family Medicine

## 2018-03-01 ENCOUNTER — Ambulatory Visit (INDEPENDENT_AMBULATORY_CARE_PROVIDER_SITE_OTHER): Payer: Medicare Other | Admitting: Family Medicine

## 2018-03-01 VITALS — BP 97/63 | HR 89 | Temp 98.0°F | Ht 69.0 in | Wt 263.0 lb

## 2018-03-01 DIAGNOSIS — E66812 Obesity, class 2: Secondary | ICD-10-CM

## 2018-03-01 DIAGNOSIS — E119 Type 2 diabetes mellitus without complications: Secondary | ICD-10-CM | POA: Diagnosis not present

## 2018-03-01 DIAGNOSIS — Z6838 Body mass index (BMI) 38.0-38.9, adult: Secondary | ICD-10-CM | POA: Diagnosis not present

## 2018-03-01 DIAGNOSIS — Z9189 Other specified personal risk factors, not elsewhere classified: Secondary | ICD-10-CM

## 2018-03-01 DIAGNOSIS — Z794 Long term (current) use of insulin: Secondary | ICD-10-CM

## 2018-03-01 DIAGNOSIS — I519 Heart disease, unspecified: Secondary | ICD-10-CM | POA: Diagnosis not present

## 2018-03-01 MED ORDER — LIRAGLUTIDE 18 MG/3ML ~~LOC~~ SOPN: 0.6000 mg | PEN_INJECTOR | SUBCUTANEOUS | 0 refills | Status: DC

## 2018-03-01 NOTE — Progress Notes (Signed)
Office: [phone]  /  Fax: [phone]   HPI:   Chief Complaint: OBESITY [name] [name] is here to discuss his progress with his obesity treatment plan. He is on the Category 4 plan and is following his eating plan approximately 80 % of the time. He states he is exercising 0 minutes 0 times per week. [name] [name] did well with weight loss on the category 4 plan and he did well with the structured meals. [name] [name] feels he may get bored, especially with dinner. He snacked too often at night due to feeling a bit depressed. His weight is 263 lb (119.3 kg) today and has had a weight loss of 9 pounds over a period of 2 weeks since his last visit. He has lost 9 lbs since starting treatment with [name].  Diabetes II [name] [name] has a diagnosis of diabetes type II. He is on Levemir and he discontinued Novolog [name] [name] states his fasting BGs range between 114 and 172, 2 hour post prandial between 130 and 253 (mostly netween 130 and 180). [name] [name] denies any hypoglycemic episodes. He has been working on intensive lifestyle modifications including diet, exercise, and weight loss to help control his blood glucose levels.  Mild Diastolic Dysfunction [name] [name] has a diagnosis of mild diastolic dysfunction with normal ejection fraction per echocardiogram. [name] [name] notes shortness of breath with exercise, but not at rest. He denies orthopnea.  At risk for cardiovascular disease [name] [name] is at a higher than average risk for cardiovascular disease due to obesity, diabetes and mild diastolic dysfunction. He currently denies any chest pain.  ALLERGIES: Allergies  Allergen Reactions  . Simvastatin Other (See Comments)    myalgias  . Antihistamines, Diphenhydramine-Type Other (See Comments)    depression  . Codeine Nausea Only  . Penicillins Hives  . Sulfa Antibiotics Hives    MEDICATIONS: Current Outpatient Medications on File Prior to Visit  Medication Sig Dispense Refill  . albuterol (PROVENTIL HFA;VENTOLIN HFA) 108 (90 Base)  MCG/ACT inhaler Inhale into the lungs every 6 (six) hours as needed for wheezing or shortness of breath.    [name] aspirin 325 MG tablet Take 325 mg by mouth daily.      [name] atorvastatin (LIPITOR) 80 MG tablet TAKE 1 TABLET BY MOUTH ONCE DAILY 90 tablet 1  . Choline Fenofibrate (FENOFIBRIC ACID) 135 MG CPDR TAKE 1 CAPSULE BY MOUTH ONCE DAILY 90 capsule 1  . dextromethorphan-guaiFENesin (MUCINEX DM) 30-600 MG 12hr tablet Take 1 tablet by mouth 2 (two) times daily as needed for cough.    . escitalopram (LEXAPRO) 10 MG tablet TAKE 1 TABLET BY MOUTH ONCE DAILY 90 tablet 3  . finasteride (PROSCAR) 5 MG tablet Take 1 tablet (5 mg total) by mouth daily. 90 tablet 0  . fluticasone (FLONASE) 50 MCG/ACT nasal spray Place into both nostrils daily.    . Insulin Detemir (LEVEMIR FLEXTOUCH) 100 UNIT/ML Pen Inject 60 Units into the skin daily at 10 pm. 52 U SQ qhs 5 pen 3  . Insulin Pen Needle (BD PEN NEEDLE NANO U/F) 32G X 4 MM MISC USE FOR INSULIN INJECTION FOUR TIMES A DAY 100 each 12  . lisinopril (PRINIVIL,ZESTRIL) 20 MG tablet Take 1 tablet (20 mg total) by mouth daily. 90 tablet 1  . MELATONIN PO Take by mouth.    . metFORMIN (GLUCOPHAGE) 500 MG tablet TAKE 2 TABLETS BY MOUTH TWICE A DAY WITH FOOD 360 tablet 1  . Naproxen Sodium (ALEVE) 220 MG CAPS Take 2 capsules by mouth at bedtime as needed.      [name]  Omega-3 Fatty Acids (FISH OIL) 1000 MG CAPS Take 1 capsule by mouth 2 (two) times daily.    . ONE TOUCH ULTRA TEST test strip CHECK GLUCOSE 3 TIMES DAILY 100 each 11  . SALINE NASAL SPRAY NA Place into the nose daily as needed.    . tamsulosin (FLOMAX) 0.4 MG CAPS capsule TAKE 2 CAPSULES BY MOUTH AT BEDTIME 180 capsule 1  . vitamin B-12 (CYANOCOBALAMIN) 1000 MCG tablet Take 1,000 mcg by mouth daily.       No current facility-administered medications on file prior to visit.     PAST MEDICAL HISTORY: Past Medical History:  Diagnosis Date  . Allergic rhinitis, unspecified    Allergy testing via allergist  04/2017--mostly neg.  Dr. Donneta Romberg recommended referral to ENT so i did this.  . Back pain   . BPH (benign prostatic hypertrophy)   . Chronic nasal congestion   . DDD (degenerative disc disease), lumbar 2010   laminectomy 01/2009  . Depression    Hospitalized for suicidal ideation 82.  Has been on lexapro since 10/2008.  . Diabetes mellitus type II    Dx'd approximately 06/2008.  No retinopathy as of 02/2017 ophth.  . Erectile dysfunction   . Erectile dysfunction   . History of colon polyps 01/2016   Recall 3 yrs  . History of pneumonia 2008   Hospitalized  . History of scarlet fever    age 61  . Hyperlipidemia, mixed 1993   myalgias on pravastatin  . Hypertension 2009   "marked chronic cardiomegaly" on CXR 01/2009 per old records.  . 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 Insomnia   . Keratoconus    dx'd 1973  . Memory changes   . OSA (obstructive sleep apnea) 2004   intolerant of CPAP  . Osteoarthritis of both knees    Mild plain film changes in medial compartment bilat  . Restless leg   . Rhinitis, chronic   . 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 APPENDECTOMY  1972  . COLONOSCOPY  X 4   Most recent was 2007 [place], [place]), with removal of polyps in the first two.  02/21/16: tubular adenoma.  Recall 3 yrs (Dr. Ardis Hughs).  . 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 ELECTROCARDIOGRAM  01/29/2009   NORMAL  . LUMBAR LAMINECTOMY  01/2009  . TONSILLECTOMY AND ADENOIDECTOMY     age 53  . URETHRAL DILATION     2 as a child, one as an adult  . VASECTOMY  1994    SOCIAL HISTORY: Social History   Tobacco Use  . Smoking status: Former Research scientist (life sciences)  . Smokeless tobacco: Never Used  Substance Use Topics  . Alcohol use: Yes    Comment: social, 3-4 drinks month  . Drug use: No    FAMILY HISTORY: Family History  Problem Relation Age of  Onset  . Heart disease Mother   . Hyperlipidemia Mother   . Stroke Mother   . Diabetes Mother   . Depression Mother   . Alzheimer's disease Mother   . Stroke Father   . Hyperlipidemia Father   . Heart disease Father   . Diabetes Father   . Hypertension Father   . Obesity Father   . Alcohol abuse Sister   . Depression Sister     ROS:  Review of Systems  Constitutional: Positive for weight loss.  Respiratory: Positive for shortness of breath (with exercise).        Negative for shortness of breath at rest  Cardiovascular: Negative for chest pain and orthopnea.    PHYSICAL EXAM: Blood pressure 97/63, pulse 89, temperature 98 F (36.7 C), temperature source Oral, height 5\' 9"  (1.753 m), weight 263 lb (119.3 kg), SpO2 98 %. Body mass index is 38.84 kg/m. Physical Exam  Constitutional: He is oriented to person, place, and time. He appears well-developed and well-nourished.  Cardiovascular: Normal rate.  Pulmonary/Chest: Effort normal.  Musculoskeletal: Normal range of motion.  Neurological: He is oriented to person, place, and time.  Skin: Skin is warm and dry.  Psychiatric: He has a normal mood and affect. His behavior is normal.  Vitals reviewed.   RECENT LABS AND TESTS: BMET    Component Value Date/Time   NA 136 02/14/2018 0848   K 4.8 02/14/2018 0848   CL 97 02/14/2018 0848   CO2 24 02/14/2018 0848   GLUCOSE 172 (H) 02/14/2018 0848   GLUCOSE 149 (H) 02/04/2018 1007   BUN 26 02/14/2018 0848   CREATININE 1.00 02/14/2018 0848   CREATININE 0.94 01/18/2017 0832   CALCIUM 10.2 02/14/2018 0848   GFRNONAA 79 02/14/2018 0848   GFRAA 91 02/14/2018 0848   Lab Results  Component Value Date   HGBA1C 7.6 (H) 02/04/2018   HGBA1C 7.1 11/05/2017   HGBA1C 8.6 04/29/2017   HGBA1C 8.9 (H) 01/18/2017   HGBA1C 9.3 (H) 07/31/2016   No results found for: INSULIN CBC    Component Value Date/Time   WBC 7.2 02/14/2018 0848   WBC 6.2 05/21/2014 1541   RBC 4.63 02/14/2018 0848    RBC 4.45 05/21/2014 1541   HGB 13.4 02/14/2018 0848   HCT 39.3 02/14/2018 0848   PLT 211.0 05/21/2014 1541   MCV 85 02/14/2018 0848   MCH 28.9 02/14/2018 0848   MCHC 34.1 02/14/2018 0848   MCHC 33.3 05/21/2014 1541   RDW 14.1 02/14/2018 0848   LYMPHSABS 2.5 02/14/2018 0848   MONOABS 0.4 05/21/2014 1541   EOSABS 0.2 02/14/2018 0848   BASOSABS 0.0 02/14/2018 0848   Iron/TIBC/Ferritin/ %Sat No results found for: IRON, TIBC, FERRITIN, IRONPCTSAT Lipid Panel     Component Value Date/Time   CHOL 167 02/04/2018 1007   TRIG 171.0 (H) 02/04/2018 1007   HDL 50.30 02/04/2018 1007   CHOLHDL 3 02/04/2018 1007   VLDL 34.2 02/04/2018 1007   LDLCALC 82 02/04/2018 1007   LDLDIRECT 112.0 07/31/2016 1116   Hepatic Function Panel     Component Value Date/Time   PROT 6.8 02/14/2018 0848   ALBUMIN 4.4 02/14/2018 0848   AST 16 02/14/2018 0848   ALT 23 02/14/2018 0848   ALKPHOS 35 (L) 02/14/2018 0848   BILITOT 0.4 02/14/2018 0848      Component Value Date/Time   TSH 2.030 02/14/2018 0848   TSH 1.46 05/21/2014 1541   TSH 2.50 05/10/2013 0832    ASSESSMENT AND PLAN: Type 2 diabetes mellitus without complication, with long-term current use of insulin ([place]) - Plan: liraglutide (VICTOZA) 18 MG/3ML SOPN  Mild diastolic dysfunction  At risk for heart disease  Class 2 severe obesity with serious comorbidity and body mass index (BMI) of 38.0 to 38.9 in adult, unspecified obesity type ([place])  PLAN:  Diabetes II [name] [name] has been given extensive diabetes education by myself today including ideal fasting and post-prandial blood glucose readings, individual ideal Hgb A1c  goals and hypoglycemia prevention. We discussed the importance of good blood sugar control to decrease the likelihood of diabetic complications such as nephropathy, neuropathy, limb loss, blindness, coronary artery disease, and death. We discussed the importance of intensive lifestyle modification including diet, exercise and  weight loss as the first line treatment for diabetes. [name] [name] will continue Levemir and start Victoza 0.6 mg qAM #1 pen and follow up at the agreed upon time.  Mild Diastolic Dysfunction [name] [name] was educated on his diagnosis and the importance of tight blood pressure control as well as weight loss. We will continue to monitor patient closely. [name] [name] agreed to follow up as directed.  Cardiovascular risk counseling [name] [name] was given extended (15 minutes) coronary artery disease prevention counseling today. He is 67 y.o. male and has risk factors for heart disease including obesity, diabetes and mild diastolic dysfunction. We discussed intensive lifestyle modifications today with an emphasis on specific weight loss instructions and strategies. Pt was also informed of the importance of increasing exercise and decreasing saturated fats to help prevent heart disease.  Obesity [name] [name] is currently in the action stage of change. As such, his goal is to continue with weight loss efforts He has agreed to follow the Category 4 plan [name] [name] has been instructed to work up to a goal of 150 minutes of combined cardio and strengthening exercise per week for weight loss and overall health benefits. We discussed the following Behavioral Modification Strategies today: no skipping meals, better snacking choices, increasing lean protein intake, decreasing simple carbohydrates  and work on meal planning and easy cooking plans  [name] [name] has agreed to follow up with our clinic in 2 weeks. He was informed of the importance of frequent follow up visits to maximize his success with intensive lifestyle modifications for his multiple health conditions.   OBESITY BEHAVIORAL INTERVENTION VISIT  Today's visit was # 2 out of 22.  Starting weight: 272 lbs Starting date: 02/14/18 Today's weight : 263 lbs Today's date: 03/01/2018 Total lbs lost to date: 9 (Patients must lose 7 lbs in the first 6 months to continue with  counseling)   ASK: We discussed the diagnosis of obesity with [name] [name] today and [name] [name] agreed to give [name] permission to discuss obesity behavioral modification therapy today.  ASSESS: [name] [name] has the diagnosis of obesity and his BMI today is 38.82 [name] [name] is in the action stage of change   ADVISE: [name] [name] was educated on the multiple health risks of obesity as well as the benefit of weight loss to improve his health. He was advised of the need for long term treatment and the importance of lifestyle modifications.  AGREE: Multiple dietary modification options and treatment options were discussed and  [name] [name] agreed to the above obesity treatment plan.  I, Doreene Nest, am acting as transcriptionist for Dennard Nip, MD  I have reviewed the above documentation for accuracy and completeness, and I agree with the above. -Dennard Nip, MD

## 2018-03-05 ENCOUNTER — Other Ambulatory Visit: Payer: Self-pay | Admitting: Family Medicine

## 2018-03-15 ENCOUNTER — Ambulatory Visit (INDEPENDENT_AMBULATORY_CARE_PROVIDER_SITE_OTHER): Payer: Medicare Other | Admitting: Family Medicine

## 2018-03-15 ENCOUNTER — Other Ambulatory Visit (INDEPENDENT_AMBULATORY_CARE_PROVIDER_SITE_OTHER): Payer: Self-pay | Admitting: Family Medicine

## 2018-03-15 VITALS — BP 98/62 | HR 94 | Temp 97.8°F | Ht 69.0 in | Wt 260.0 lb

## 2018-03-15 DIAGNOSIS — Z794 Long term (current) use of insulin: Secondary | ICD-10-CM

## 2018-03-15 DIAGNOSIS — R413 Other amnesia: Secondary | ICD-10-CM

## 2018-03-15 DIAGNOSIS — F3289 Other specified depressive episodes: Secondary | ICD-10-CM | POA: Diagnosis not present

## 2018-03-15 DIAGNOSIS — Z6838 Body mass index (BMI) 38.0-38.9, adult: Secondary | ICD-10-CM | POA: Diagnosis not present

## 2018-03-15 DIAGNOSIS — E119 Type 2 diabetes mellitus without complications: Secondary | ICD-10-CM

## 2018-03-15 MED ORDER — ESCITALOPRAM OXALATE 20 MG PO TABS: 20.0000 mg | ORAL_TABLET | Freq: Every day | ORAL | 0 refills | Status: DC

## 2018-03-15 NOTE — Progress Notes (Signed)
Office: [phone]  /  Fax: [phone]   HPI:   Chief Complaint: OBESITY [name] [name] is here to discuss his progress with his obesity treatment plan. He is on the  follow the Category 4 plan and is following his eating plan approximately 80 to 85 % of the time. He states he is walking the dogs 10 to 15 minutes 3 times per week. [name] [name] has done well with weight loss on his category 4 plan. He is happy with his weight loss, but is frustrated with a lot of other parts of his life. He is working on meal planning and avoiding emotional eating. His weight is 260 lb (117.9 kg) today and has had a weight loss of 3 pounds over a period of 2 weeks since his last visit. He has lost 12 lbs since starting treatment with [name].  Diabetes II [name] [name] has a diagnosis of diabetes type II. He started Victoza and notes mild nausea before each meal. [name] [name] states fasting BGs range between 95 and 165, mostly between 1150 and 130, 2 hour post prandial BGs range between 140 and 150's. [name] [name] denies any hypoglycemic episodes. He has been working on intensive lifestyle modifications including diet, exercise, and weight loss to help control his blood glucose levels.  Memory Loss [name] [name] notes increased irritability and feels he is having mild short term memory loss. He is frustrated that he didn't understand all the instructions on his taxes that he did this weekend. He fears he is developing dementia.  Depression with Anxiety [name] [name] notes increased irritability and increased anger, worse in the last two weeks. He feels easily overwhelmed and at times helpless to make changes. He shows no sign of suicidal or homicidal ideations.  Depression screen [name] [name] 2/9 02/14/2018 02/04/2018 11/05/2017 07/08/2017 05/27/2017  Decreased Interest 2 0 0 0 0  Down, Depressed, Hopeless 1 1 1  0 1  PHQ - 2 Score 3 1 1  0 1  Altered sleeping 1 3 3  - -  Tired, decreased energy 3 1 2  - -  Change in appetite 2 0 1 - -  Feeling bad or failure about  yourself  1 0 1 - -  Trouble concentrating 3 0 1 - -  Moving slowly or fidgety/restless 1 0 0 - -  Suicidal thoughts 0 0 0 - -  PHQ-9 Score 14 5 9  - -  Difficult doing work/chores Not difficult at all Not difficult at all Not difficult at all - -      ALLERGIES: Allergies  Allergen Reactions  . Simvastatin Other (See Comments)    myalgias  . Antihistamines, Diphenhydramine-Type Other (See Comments)    depression  . Codeine Nausea Only  . Penicillins Hives  . Sulfa Antibiotics Hives    MEDICATIONS: Current Outpatient Medications on File Prior to Visit  Medication Sig Dispense Refill  . albuterol (PROVENTIL HFA;VENTOLIN HFA) 108 (90 Base) MCG/ACT inhaler Inhale into the lungs every 6 (six) hours as needed for wheezing or shortness of breath.    [name] aspirin 325 MG tablet Take 325 mg by mouth daily.      [name] atorvastatin (LIPITOR) 80 MG tablet TAKE 1 TABLET BY MOUTH ONCE DAILY 90 tablet 1  . Choline Fenofibrate (FENOFIBRIC ACID) 135 MG CPDR TAKE 1 CAPSULE BY MOUTH ONCE DAILY 90 capsule 1  . dextromethorphan-guaiFENesin (MUCINEX DM) 30-600 MG 12hr tablet Take 1 tablet by mouth 2 (two) times daily as needed for cough.    . finasteride (PROSCAR) 5 MG tablet Take 1 tablet (  5 mg total) by mouth daily. 90 tablet 0  . fluticasone (FLONASE) 50 MCG/ACT nasal spray Place into both nostrils daily.    . Insulin Detemir (LEVEMIR FLEXTOUCH) 100 UNIT/ML Pen Inject 60 Units into the skin daily at 10 pm. 52 U SQ qhs 5 pen 3  . Insulin Pen Needle (BD PEN NEEDLE NANO U/F) 32G X 4 MM MISC USE FOR INSULIN INJECTION FOUR TIMES A DAY 100 each 12  . liraglutide (VICTOZA) 18 MG/3ML SOPN Inject 0.1 mLs (0.6 mg total) into the skin every morning. 1 pen 0  . lisinopril (PRINIVIL,ZESTRIL) 20 MG tablet Take 1 tablet (20 mg total) by mouth daily. 90 tablet 1  . MELATONIN PO Take by mouth.    . metFORMIN (GLUCOPHAGE) 500 MG tablet TAKE 2 TABLETS BY MOUTH TWICE A DAY WITH FOOD 360 tablet 1  . Naproxen Sodium (ALEVE)  220 MG CAPS Take 2 capsules by mouth at bedtime as needed.      . Omega-3 Fatty Acids (FISH OIL) 1000 MG CAPS Take 1 capsule by mouth 2 (two) times daily.    . ONE TOUCH ULTRA TEST test strip TEST THREE TIMES DAILY 100 each 11  . SALINE NASAL SPRAY NA Place into the nose daily as needed.    . tamsulosin (FLOMAX) 0.4 MG CAPS capsule TAKE 2 CAPSULES BY MOUTH AT BEDTIME 180 capsule 1  . vitamin B-12 (CYANOCOBALAMIN) 1000 MCG tablet Take 1,000 mcg by mouth daily.       No current facility-administered medications on file prior to visit.     PAST MEDICAL HISTORY: Past Medical History:  Diagnosis Date  . Allergic rhinitis, unspecified    Allergy testing via allergist 04/2017--mostly neg.  Dr. Donneta Romberg recommended referral to ENT so i did this.  . Back pain   . BPH (benign prostatic hypertrophy)   . Chronic nasal congestion   . DDD (degenerative disc disease), lumbar 2010   laminectomy 01/2009  . Depression    Hospitalized for suicidal ideation 20.  Has been on lexapro since 10/2008.  . Diabetes mellitus type II    Dx'd approximately 06/2008.  No retinopathy as of 02/2017 ophth.  . Erectile dysfunction   . Erectile dysfunction   . History of colon polyps 01/2016   Recall 3 yrs  . History of pneumonia 2008   Hospitalized  . History of scarlet fever    age 76  . Hyperlipidemia, mixed 1993   myalgias on pravastatin  . Hypertension 2009   "marked chronic cardiomegaly" on CXR 01/2009 per old records.  . 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 Insomnia   . Keratoconus    dx'd 1973  . Memory changes   . OSA (obstructive sleep apnea) 2004   intolerant of CPAP  . Osteoarthritis of both knees    Mild plain film changes in medial compartment bilat  . Restless leg   . Rhinitis, chronic   . 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 APPENDECTOMY   1972  . COLONOSCOPY  X 4   Most recent was 2007 [place], [place]), with removal of polyps in the first two.  02/21/16: tubular adenoma.  Recall 3 yrs (Dr. Ardis Hughs).  . 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 ELECTROCARDIOGRAM  01/29/2009   NORMAL  . LUMBAR LAMINECTOMY  01/2009  .  TONSILLECTOMY AND ADENOIDECTOMY     age 18  . URETHRAL DILATION     2 as a child, one as an adult  . VASECTOMY  1994    SOCIAL HISTORY: Social History   Tobacco Use  . Smoking status: Former Research scientist (life sciences)  . Smokeless tobacco: Never Used  Substance Use Topics  . Alcohol use: Yes    Comment: social, 3-4 drinks month  . Drug use: No    FAMILY HISTORY: Family History  Problem Relation Age of Onset  . Heart disease Mother   . Hyperlipidemia Mother   . Stroke Mother   . Diabetes Mother   . Depression Mother   . Alzheimer's disease Mother   . Stroke Father   . Hyperlipidemia Father   . Heart disease Father   . Diabetes Father   . Hypertension Father   . Obesity Father   . Alcohol abuse Sister   . Depression Sister     ROS: Review of Systems  Constitutional: Positive for weight loss.  Endo/Heme/Allergies:       Negative for hypoglycemia  Psychiatric/Behavioral: Positive for depression. Negative for suicidal ideas.       Positive for irritability Positive for anger    PHYSICAL EXAM: Blood pressure 98/62, pulse 94, temperature 97.8 F (36.6 C), temperature source Oral, height 5\' 9"  (1.753 m), weight 260 lb (117.9 kg), SpO2 95 %. Body mass index is 38.4 kg/m. Physical Exam  Constitutional: He is oriented to person, place, and time. He appears well-developed and well-nourished.  Cardiovascular: Normal rate.  Pulmonary/Chest: Effort normal.  Musculoskeletal: Normal range of motion.  Neurological: He is oriented to person, place, and time.  Skin: Skin is warm and dry.  Psychiatric: He has a normal mood and affect. His behavior is normal.  Vitals reviewed.   RECENT LABS AND TESTS: BMET      Component Value Date/Time   NA 136 02/14/2018 0848   K 4.8 02/14/2018 0848   CL 97 02/14/2018 0848   CO2 24 02/14/2018 0848   GLUCOSE 172 (H) 02/14/2018 0848   GLUCOSE 149 (H) 02/04/2018 1007   BUN 26 02/14/2018 0848   CREATININE 1.00 02/14/2018 0848   CREATININE 0.94 01/18/2017 0832   CALCIUM 10.2 02/14/2018 0848   GFRNONAA 79 02/14/2018 0848   GFRAA 91 02/14/2018 0848   Lab Results  Component Value Date   HGBA1C 7.6 (H) 02/04/2018   HGBA1C 7.1 11/05/2017   HGBA1C 8.6 04/29/2017   HGBA1C 8.9 (H) 01/18/2017   HGBA1C 9.3 (H) 07/31/2016   No results found for: INSULIN CBC    Component Value Date/Time   WBC 7.2 02/14/2018 0848   WBC 6.2 05/21/2014 1541   RBC 4.63 02/14/2018 0848   RBC 4.45 05/21/2014 1541   HGB 13.4 02/14/2018 0848   HCT 39.3 02/14/2018 0848   PLT 211.0 05/21/2014 1541   MCV 85 02/14/2018 0848   MCH 28.9 02/14/2018 0848   MCHC 34.1 02/14/2018 0848   MCHC 33.3 05/21/2014 1541   RDW 14.1 02/14/2018 0848   LYMPHSABS 2.5 02/14/2018 0848   MONOABS 0.4 05/21/2014 1541   EOSABS 0.2 02/14/2018 0848   BASOSABS 0.0 02/14/2018 0848   Iron/TIBC/Ferritin/ %Sat No results found for: IRON, TIBC, FERRITIN, IRONPCTSAT Lipid Panel     Component Value Date/Time   CHOL 167 02/04/2018 1007   TRIG 171.0 (H) 02/04/2018 1007   HDL 50.30 02/04/2018 1007   CHOLHDL 3 02/04/2018 1007   VLDL 34.2 02/04/2018 1007   LDLCALC 82 02/04/2018  1007   LDLDIRECT 112.0 07/31/2016 1116   Hepatic Function Panel     Component Value Date/Time   PROT 6.8 02/14/2018 0848   ALBUMIN 4.4 02/14/2018 0848   AST 16 02/14/2018 0848   ALT 23 02/14/2018 0848   ALKPHOS 35 (L) 02/14/2018 0848   BILITOT 0.4 02/14/2018 0848      Component Value Date/Time   TSH 2.030 02/14/2018 0848   TSH 1.46 05/21/2014 1541   TSH 2.50 05/10/2013 0832    ASSESSMENT AND PLAN: Type 2 diabetes mellitus without complication, with long-term current use of insulin ([place])  Memory loss - Plan: Ambulatory  referral to Neurology  Other depression - with emotional eating  - Plan: escitalopram (LEXAPRO) 20 MG tablet  Class 2 severe obesity with serious comorbidity and body mass index (BMI) of 38.0 to 38.9 in adult, unspecified obesity type ([place])  PLAN:  Diabetes II Kelsen has been given extensive diabetes education by myself today including ideal fasting and post-prandial blood glucose readings, individual ideal Hgb A1c goals and hypoglycemia prevention. We discussed the importance of good blood sugar control to decrease the likelihood of diabetic complications such as nephropathy, neuropathy, limb loss, blindness, coronary artery disease, and death. We discussed the importance of intensive lifestyle modification including diet, exercise and weight loss as the first line treatment for diabetes. Severin was encouraged to continue Victoza, but to make sure to eat a small snack in-between meals as going too long without food can increase nausea. Dorien agrees and he will follow up at the agreed upon time.  Memory Loss We will refer to Dr. Jaynee Eagles [facility]) for evaluation and treatment. Latroy agrees to follow up with our clinic in 2 to 3 weeks.  Depression with Anxiety We discussed behavior modification techniques today to help Clark deal with his depression. He has agreed to increase Lexapro to 20 mg qd #30 with no refills and follow up with our clinic in 2 to 3 weeks to reassess.  Obesity Ethaniel is currently in the action stage of change. As such, his goal is to continue with weight loss efforts He has agreed to follow the Category 4 plan Kinsley has been instructed to work up to a goal of 150 minutes of combined cardio and strengthening exercise per week for weight loss and overall health benefits. We discussed the following Behavioral Modification Strategies today: keeping healthy foods in the home, increasing lean protein intake, work on meal planning and easy cooking plans and  emotional eating strategies  Doyle has agreed to follow up with our clinic in 2 to 3 weeks. He was informed of the importance of frequent follow up visits to maximize his success with intensive lifestyle modifications for his multiple health conditions.   OBESITY BEHAVIORAL INTERVENTION VISIT  Today's visit was # 3 out of 22.  Starting weight: 272 lbs Starting date: 02/14/18 Today's weight : 260 lbs Today's date: 03/15/2018 Total lbs lost to date: 12 (Patients must lose 7 lbs in the first 6 months to continue with counseling)   ASK: We discussed the diagnosis of obesity with Margarita Rana today and Santiago agreed to give [name] permission to discuss obesity behavioral modification therapy today.  ASSESS: Kayleb has the diagnosis of obesity and his BMI today is 38.38 Davi is in the action stage of change   ADVISE: Jourdain was educated on the multiple health risks of obesity as well as the benefit of weight loss to improve his health. He was advised of the need  for long term treatment and the importance of lifestyle modifications.  AGREE: Multiple dietary modification options and treatment options were discussed and  Amond agreed to the above obesity treatment plan.  I, Doreene Nest, am acting as transcriptionist for Dennard Nip, MD  I have reviewed the above documentation for accuracy and completeness, and I agree with the above. -Dennard Nip, MD

## 2018-03-28 ENCOUNTER — Other Ambulatory Visit (INDEPENDENT_AMBULATORY_CARE_PROVIDER_SITE_OTHER): Payer: Self-pay | Admitting: Family Medicine

## 2018-03-28 DIAGNOSIS — Z794 Long term (current) use of insulin: Principal | ICD-10-CM

## 2018-03-28 DIAGNOSIS — E119 Type 2 diabetes mellitus without complications: Secondary | ICD-10-CM

## 2018-03-29 ENCOUNTER — Encounter: Payer: Self-pay | Admitting: Neurology

## 2018-03-29 ENCOUNTER — Ambulatory Visit (INDEPENDENT_AMBULATORY_CARE_PROVIDER_SITE_OTHER): Payer: Medicare Other | Admitting: Neurology

## 2018-03-29 VITALS — BP 120/69 | HR 95 | Ht 70.0 in | Wt 264.0 lb

## 2018-03-29 DIAGNOSIS — R519 Headache, unspecified: Secondary | ICD-10-CM

## 2018-03-29 DIAGNOSIS — G4761 Periodic limb movement disorder: Secondary | ICD-10-CM

## 2018-03-29 DIAGNOSIS — G4733 Obstructive sleep apnea (adult) (pediatric): Secondary | ICD-10-CM

## 2018-03-29 DIAGNOSIS — G4719 Other hypersomnia: Secondary | ICD-10-CM | POA: Diagnosis not present

## 2018-03-29 DIAGNOSIS — R51 Headache: Secondary | ICD-10-CM

## 2018-03-29 DIAGNOSIS — E669 Obesity, unspecified: Secondary | ICD-10-CM

## 2018-03-29 DIAGNOSIS — R351 Nocturia: Secondary | ICD-10-CM | POA: Diagnosis not present

## 2018-03-29 DIAGNOSIS — G2581 Restless legs syndrome: Secondary | ICD-10-CM

## 2018-03-29 NOTE — Progress Notes (Signed)
Subjective:    Patient ID: [name] [name] is a 67 y.o. male.  HPI     Star Age, MD, PhD [facility] [address], [address] [place], [place] [address]  Dear Dr. Anitra Lauth,   I saw your patient, [name] [name], upon your kind request, in my neurologic clinic today for initial consultation of his sleep disorder, in particular, concern for underlying obstructive sleep apnea. The patient is accompanied by his wife today. As you know, [name] [name] is a 67 year old right-handed gentleman with an underlying medical history of hypertension, cardiomegaly, hyperlipidemia, depression, degenerative disc disease, BPH, allergic rhinitis, type 2 diabetes, and obesity, who was previously diagnosed with obstructive sleep apnea. Prior sleep study results are not available for my review today. He reports snoring and excessive daytime somnolence, difficulty with concentration and memory. I reviewed your office note from 02/04/2018. He has been followed by medical weight management for his obesity. He estimates that his sleep study was about 15 or 20 years ago even, he was given a CPAP machine, he tried it but could not tolerate it due to claustrophobia had issues with nasal congestion. Of note, he had nasal surgery in the recent past, inferior turbinate reduction. He suspects that he has allergies but was tested recently for allergies and did not have any significant allergens. He would be willing to try CPAP again and be tested for sleep apnea. He had some memory issues. His wife successfully uses a CPAP machine and has benefited from it which encouraged him. He snores, he has breathing pauses while asleep less so since he has started avoiding sleep on his back. He had back surgery as well.  His Epworth sleepiness score is 12 out of 24 today, fatigue score is 55 out of 63. He is married and lives with his wife. He does not smoke, drinks alcohol rarely, drinks caffeine in the  form of tea, 2 cups per day on average. He has had a long-standing history of restless leg syndrome, I does not very bothersome and certainly fluctuates in degree. Sometimes it affects only one leg, sometimes both, he does move in his sleep per wife's report and he has vivid dreams. He has had occasional calling out of dreams.  His Past Medical History Is Significant For: Past Medical History:  Diagnosis Date  . Allergic rhinitis, unspecified    Allergy testing via allergist 04/2017--mostly neg.  Dr. Donneta Romberg recommended referral to ENT so i did this.  . Back pain   . BPH (benign prostatic hypertrophy)   . Chronic nasal congestion   . DDD (degenerative disc disease), lumbar 2010   laminectomy 01/2009  . Depression    Hospitalized for suicidal ideation 55.  Has been on lexapro since 10/2008.  . Diabetes mellitus type II    Dx'd approximately 06/2008.  No retinopathy as of 02/2017 ophth.  . Erectile dysfunction   . Erectile dysfunction   . History of colon polyps 01/2016   Recall 3 yrs  . History of pneumonia 2008   Hospitalized  . History of scarlet fever    age 27  . Hyperlipidemia, mixed 1993   myalgias on pravastatin  . Hypertension 2009   "marked chronic cardiomegaly" on CXR 01/2009 per old records.  . 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 Insomnia   . Keratoconus    dx'd 1973  . Memory changes   . OSA (obstructive sleep apnea) 2004   intolerant of CPAP  .  Osteoarthritis of both knees    Mild plain film changes in medial compartment bilat  . Restless leg   . Rhinitis, chronic   . 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 APPENDECTOMY  1972  . COLONOSCOPY  X 4   Most recent was 2007 [place], [place]), with removal of polyps in the first two.  02/21/16: tubular adenoma.  Recall 3 yrs (Dr. Ardis Hughs).  . 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 ELECTROCARDIOGRAM  01/29/2009   NORMAL  . LUMBAR LAMINECTOMY  01/2009  . TONSILLECTOMY AND ADENOIDECTOMY     age 5  . URETHRAL DILATION     2 as a child, one as an adult  . VASECTOMY  1994    His Family History Is Significant For: Family History  Problem Relation Age of Onset  . Heart disease Mother   . Hyperlipidemia Mother   . Stroke Mother   . Diabetes Mother   . Depression Mother   . Alzheimer's disease Mother   . Stroke Father   . Hyperlipidemia Father   . Heart disease Father   . Diabetes Father   . Hypertension Father   . Obesity Father   . Alcohol abuse Sister   . Depression Sister     His Social History Is Significant For: Social History   Socioeconomic History  . Marital status: Married    Spouse name: [name] [name]  . Number of children: Not on file  . Years of education: Not on file  . Highest education level: Not on file  Occupational History  . Occupation: Retired  Scientific laboratory technician  . Financial resource strain: Not on file  . Food insecurity:    Worry: Not on file    Inability: Not on file  . Transportation needs:    Medical: Not on file    Non-medical: Not on file  Tobacco Use  . Smoking status: Former Research scientist (life sciences)  . Smokeless tobacco: Never Used  Substance and Sexual Activity  . Alcohol use: Yes    Comment: social, 3-4 drinks month  . Drug use: No  . Sexual activity: Yes    Partners: Female  Lifestyle  . Physical activity:    Days per week: Not on file    Minutes per session: Not on file  . Stress: Not on file  Relationships  . Social connections:    Talks on phone: Not on file    Gets together: Not on file    Attends religious service: Not on file    Active member of club or organization: Not on file    Attends meetings of clubs or organizations: Not on file    Relationship status: Not on file  Other Topics Concern  . Not on file  Social History Narrative   Divorced, then remarried.  Has 3 sons, no  grandchildren.   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   No tobacco.  Rare ETOH.  No drug abuse.   Enjoys reading and spending time with his 2 dogs.    His Allergies Are:  Allergies  Allergen Reactions  . Simvastatin Other (See Comments)    myalgias  . Antihistamines, Diphenhydramine-Type Other (See Comments)    depression  . Codeine Nausea Only  .  Penicillins Hives  . Sulfa Antibiotics Hives  :   His Current Medications Are:  Outpatient Encounter Medications as of 03/29/2018  Medication Sig  . albuterol (PROVENTIL HFA;VENTOLIN HFA) 108 (90 Base) MCG/ACT inhaler Inhale into the lungs every 6 (six) hours as needed for wheezing or shortness of breath.  [name] aspirin 325 MG tablet Take 325 mg by mouth daily.    [name] atorvastatin (LIPITOR) 80 MG tablet TAKE 1 TABLET BY MOUTH ONCE DAILY  . Choline Fenofibrate (FENOFIBRIC ACID) 135 MG CPDR TAKE 1 CAPSULE BY MOUTH ONCE DAILY  . dextromethorphan-guaiFENesin (MUCINEX DM) 30-600 MG 12hr tablet Take 1 tablet by mouth 2 (two) times daily as needed for cough.  . escitalopram (LEXAPRO) 20 MG tablet Take 1 tablet (20 mg total) by mouth daily.  . finasteride (PROSCAR) 5 MG tablet Take 1 tablet (5 mg total) by mouth daily.  . fluticasone (FLONASE) 50 MCG/ACT nasal spray Place into both nostrils daily.  . Insulin Detemir (LEVEMIR FLEXTOUCH) 100 UNIT/ML Pen Inject 60 Units into the skin daily at 10 pm. 52 U SQ qhs  . Insulin Pen Needle (BD PEN NEEDLE NANO U/F) 32G X 4 MM MISC USE FOR INSULIN INJECTION FOUR TIMES A DAY  . lisinopril (PRINIVIL,ZESTRIL) 20 MG tablet Take 1 tablet (20 mg total) by mouth daily.  [name] MELATONIN PO Take by mouth.  . metFORMIN (GLUCOPHAGE) 500 MG tablet TAKE 2 TABLETS BY MOUTH TWICE A DAY WITH FOOD  . Naproxen Sodium (ALEVE) 220 MG CAPS Take 2 capsules by mouth at bedtime as needed.    . Omega-3 Fatty Acids (FISH OIL) 1000 MG CAPS Take 1 capsule by mouth 2 (two)  times daily.  . ONE TOUCH ULTRA TEST test strip TEST THREE TIMES DAILY  . SALINE NASAL SPRAY NA Place into the nose daily as needed.  . tamsulosin (FLOMAX) 0.4 MG CAPS capsule TAKE 2 CAPSULES BY MOUTH AT BEDTIME  . VICTOZA 18 MG/3ML SOPN ADMINISTER 0.6 MG UNDER THE SKIN EVERY MORNING  . vitamin B-12 (CYANOCOBALAMIN) 1000 MCG tablet Take 1,000 mcg by mouth daily.     No facility-administered encounter medications on file as of 03/29/2018.   :  Review of Systems:  Out of a complete 14 point review of systems, all are reviewed and negative with the exception of these symptoms as listed below: Review of Systems  Neurological:       Pt presents today to discuss his sleep. Pt has never had a sleep study but does endorse snoring.  Epworth Sleepiness Scale 0= would never doze 1= slight chance of dozing 2= moderate chance of dozing 3= high chance of dozing  Sitting and reading: 1 Watching TV: 2 Sitting inactive in a public place (ex. Theater or meeting): 2 As a passenger in a car for an hour without a break: 2 Lying down to rest in the afternoon: 3 Sitting and talking to someone: 0 Sitting quietly after lunch (no alcohol): 2 In a car, while stopped in traffic: 0 Total: 12     Objective:  Neurological Exam  Physical Exam Physical Examination:   Vitals:   03/29/18 1052  BP: 120/69  Pulse: 95    General Examination: The patient is a very pleasant 67 y.o. male in no acute distress. He appears well-developed and well-nourished and well groomed.   HEENT: Normocephalic, atraumatic, pupils are equal, round and reactive to light and accommodation. Extraocular tracking is good without limitation to gaze excursion or nystagmus noted. Normal smooth pursuit is noted. Hearing  is grossly intact. Face is symmetric with normal facial animation and normal facial sensation. Speech is clear with no dysarthria noted. There is no hypophonia. There is no lip, neck/head, jaw or voice tremor. Neck is  supple with full range of passive and active motion. There are no carotid bruits on auscultation. Oropharynx exam reveals: mild mouth dryness, adequate dental hygiene and moderate airway crowding, due to redundant soft palate. Mallampati is class III. Tongue protrudes centrally and palate elevates symmetrically. Tonsils are absent. Neck size is 17 3/8 inches. He has a Mild overbite. Nasal inspection reveals no significant nasal mucosal bogginess or redness and no significant septal deviation.   Chest: Clear to auscultation without wheezing, rhonchi or crackles noted.  Heart: S1+S2+0, regular and normal without murmurs, rubs or gallops noted.   Abdomen: Soft, non-tender and non-distended with normal bowel sounds appreciated on auscultation.  Extremities: There is trace pitting edema in the distal lower extremities bilaterally. Pedal pulses are intact.  Skin: Warm and dry without trophic changes noted.  Musculoskeletal: exam reveals no obvious joint deformities, tenderness or joint swelling or erythema.   Neurologically:  Mental status: The patient is awake, alert and oriented in all 4 spheres. His immediate and remote memory, attention, language skills and fund of knowledge are appropriate. There is no evidence of aphasia, agnosia, apraxia or anomia. Speech is clear with normal prosody and enunciation. Thought process is linear. Mood is normal and affect is normal.  Cranial nerves II - XII are as described above under HEENT exam. In addition: shoulder shrug is normal with equal shoulder height noted. Motor exam: Normal bulk, strength and tone is noted. There is no drift, tremor or rebound. Romberg is negative. Reflexes are 1-2+ throughout. Fine motor skills and coordination: intact with normal finger taps, normal hand movements, normal rapid alternating patting, normal foot taps and normal foot agility.  Cerebellar testing: No dysmetria or intention tremor. There is no truncal or gait ataxia.   Sensory exam: intact to light touch in the upper and lower extremities.  Gait, station and balance: He stands easily. No veering to one side is noted. No leaning to one side is noted. Posture is age-appropriate and stance is narrow based. Gait shows normal stride length and normal pace. No problems turning are noted. Tandem walk is challenging for him.   Assessment and Plan:  In summary, [name] [name] is a very pleasant 67 y.o.-year old male with an underlying medical history of hypertension, cardiomegaly, hyperlipidemia, depression, degenerative disc disease, BPH, allergic rhinitis, type 2 diabetes, and obesity, whose history and physical exam are in keeping with obstructive sleep apnea (OSA). I had a long chat with the patient and his wife about my findings and the diagnosis of OSA, its prognosis and treatment options. We talked about medical treatments, surgical interventions and non-pharmacological approaches. I explained in particular the risks and ramifications of untreated moderate to severe OSA, especially with respect to developing cardiovascular disease down the Road, including congestive heart failure, difficult to treat hypertension, cardiac arrhythmias, or stroke. Even type 2 diabetes has, in part, been linked to untreated OSA. Symptoms of untreated OSA include daytime sleepiness, memory problems, mood irritability and mood disorder such as depression and anxiety, lack of energy, as well as recurrent headaches, especially morning headaches. We talked about trying to maintain a healthy lifestyle in general, as well as the importance of weight control. I encouraged the patient to eat healthy, exercise daily and keep well hydrated, to keep a scheduled bedtime and  wake time routine, to not skip any meals and eat healthy snacks in between meals. I advised the patient not to drive when feeling sleepy. I recommended the following at this time: sleep study with potential positive airway pressure  titration. (We will score hypopneas at 4%).   I explained the sleep test procedure to the patient and also outlined possible surgical and non-surgical treatment options of OSA, including the use of a custom-made dental device (which would require a referral to a specialist dentist or oral surgeon), upper airway surgical options, such as pillar implants, radiofrequency surgery, tongue base surgery, and UPPP (which would involve a referral to an ENT surgeon). Rarely, jaw surgery such as mandibular advancement may be considered.  I also explained the CPAP treatment option to the patient, who indicated that he would be willing to try CPAP if the need arises. I explained the importance of being compliant with PAP treatment, not only for insurance purposes but primarily to improve His symptoms, and for the patient's long term health benefit, including to reduce His cardiovascular risks. I answered all their questions today and the patient and his wife were in agreement. I would like to see him back after the sleep study is completed and encouraged him to call with any interim questions, concerns, problems or updates.   Thank you very much for allowing me to participate in the care of this nice patient. If I can be of any further assistance to you please do not hesitate to call me at [phone].  Sincerely,   Star Age, MD, PhD

## 2018-03-29 NOTE — Patient Instructions (Signed)

## 2018-04-05 ENCOUNTER — Ambulatory Visit (INDEPENDENT_AMBULATORY_CARE_PROVIDER_SITE_OTHER): Payer: Medicare Other | Admitting: Family Medicine

## 2018-04-05 VITALS — BP 104/63 | HR 88 | Temp 97.8°F | Ht 70.0 in | Wt 257.0 lb

## 2018-04-05 DIAGNOSIS — Z6838 Body mass index (BMI) 38.0-38.9, adult: Secondary | ICD-10-CM

## 2018-04-05 DIAGNOSIS — E119 Type 2 diabetes mellitus without complications: Secondary | ICD-10-CM | POA: Diagnosis not present

## 2018-04-07 NOTE — Progress Notes (Signed)
Office: [phone]  /  Fax: [phone]   HPI:   Chief Complaint: OBESITY [name] [name] is here to discuss his progress with his obesity treatment plan. He is on the Category 4 plan and is following his eating plan approximately 80 % of the time. He states he is exercising 0 minutes 0 times per week. [name] [name] continues to do well with weight loss. He is snacking more than allowed, mostly due to stress, but otherwise he is doing well. [name] [name] is going on vacation this weekend and has questions about how to eat while traveling. His weight is 257 lb (116.6 kg) today and has had a weight loss of 3 pounds over a period of 3 weeks since his last visit. He has lost 15 lbs since starting treatment with [name].  Diabetes II [name] [name] has a diagnosis of diabetes type II. [name] [name] does not have a blood sugar log today. He denies any hypoglycemic episodes. He has been working on intensive lifestyle modifications including diet, exercise, and weight loss to help control his blood glucose levels.[name] [name] has questions about what fruits affect his diabetes the most and if he can have watermelon as an option.   ALLERGIES: Allergies  Allergen Reactions  . Simvastatin Other (See Comments)    myalgias  . Antihistamines, Diphenhydramine-Type Other (See Comments)    depression  . Codeine Nausea Only  . Penicillins Hives  . Sulfa Antibiotics Hives    MEDICATIONS: Current Outpatient Medications on File Prior to Visit  Medication Sig Dispense Refill  . albuterol (PROVENTIL HFA;VENTOLIN HFA) 108 (90 Base) MCG/ACT inhaler Inhale into the lungs every 6 (six) hours as needed for wheezing or shortness of breath.    [name] aspirin 325 MG tablet Take 325 mg by mouth daily.      [name] atorvastatin (LIPITOR) 80 MG tablet TAKE 1 TABLET BY MOUTH ONCE DAILY 90 tablet 1  . Choline Fenofibrate (FENOFIBRIC ACID) 135 MG CPDR TAKE 1 CAPSULE BY MOUTH ONCE DAILY 90 capsule 1  . dextromethorphan-guaiFENesin (MUCINEX DM) 30-600 MG 12hr tablet Take 1  tablet by mouth 2 (two) times daily as needed for cough.    . escitalopram (LEXAPRO) 20 MG tablet Take 1 tablet (20 mg total) by mouth daily. 30 tablet 0  . finasteride (PROSCAR) 5 MG tablet Take 1 tablet (5 mg total) by mouth daily. 90 tablet 0  . fluticasone (FLONASE) 50 MCG/ACT nasal spray Place into both nostrils daily.    . Insulin Detemir (LEVEMIR FLEXTOUCH) 100 UNIT/ML Pen Inject 60 Units into the skin daily at 10 pm. 52 U SQ qhs 5 pen 3  . Insulin Pen Needle (BD PEN NEEDLE NANO U/F) 32G X 4 MM MISC USE FOR INSULIN INJECTION FOUR TIMES A DAY 100 each 12  . lisinopril (PRINIVIL,ZESTRIL) 20 MG tablet Take 1 tablet (20 mg total) by mouth daily. 90 tablet 1  . MELATONIN PO Take by mouth.    . metFORMIN (GLUCOPHAGE) 500 MG tablet TAKE 2 TABLETS BY MOUTH TWICE A DAY WITH FOOD 360 tablet 1  . Naproxen Sodium (ALEVE) 220 MG CAPS Take 2 capsules by mouth at bedtime as needed.      . Omega-3 Fatty Acids (FISH OIL) 1000 MG CAPS Take 1 capsule by mouth 2 (two) times daily.    . ONE TOUCH ULTRA TEST test strip TEST THREE TIMES DAILY 100 each 11  . SALINE NASAL SPRAY NA Place into the nose daily as needed.    . tamsulosin (FLOMAX) 0.4 MG CAPS capsule TAKE 2  CAPSULES BY MOUTH AT BEDTIME 180 capsule 1  . VICTOZA 18 MG/3ML SOPN ADMINISTER 0.6 MG UNDER THE SKIN EVERY MORNING 3 mL 0  . vitamin B-12 (CYANOCOBALAMIN) 1000 MCG tablet Take 1,000 mcg by mouth daily.       No current facility-administered medications on file prior to visit.     PAST MEDICAL HISTORY: Past Medical History:  Diagnosis Date  . Allergic rhinitis, unspecified    Allergy testing via allergist 04/2017--mostly neg.  Dr. Donneta [name] recommended referral to ENT so i did this.  . Back pain   . BPH (benign prostatic hypertrophy)   . Chronic nasal congestion   . DDD (degenerative disc disease), lumbar 2010   laminectomy 01/2009  . Depression    Hospitalized for suicidal ideation 30.  Has been on lexapro since 10/2008.  . Diabetes  mellitus type II    Dx'd approximately 06/2008.  No retinopathy as of 02/2017 ophth.  . Erectile dysfunction   . Erectile dysfunction   . History of colon polyps 01/2016   Recall 3 yrs  . History of pneumonia 2008   Hospitalized  . History of scarlet fever    age 14  . Hyperlipidemia, mixed 1993   myalgias on pravastatin  . Hypertension 2009   "marked chronic cardiomegaly" on CXR 01/2009 per old records.  . 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 Insomnia   . Keratoconus    dx'd 1973  . Memory changes   . OSA (obstructive sleep apnea) 2004   intolerant of CPAP  . Osteoarthritis of both knees    Mild plain film changes in medial compartment bilat  . Restless leg   . Rhinitis, chronic   . 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 APPENDECTOMY  1972  . COLONOSCOPY  X 4   Most recent was 2007 [place], [place]), with removal of polyps in the first two.  02/21/16: tubular adenoma.  Recall 3 yrs (Dr. Ardis [name]).  . 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 ELECTROCARDIOGRAM  01/29/2009   NORMAL  . LUMBAR LAMINECTOMY  01/2009  . TONSILLECTOMY AND ADENOIDECTOMY     age 5  . URETHRAL DILATION     2 as a child, one as an adult  . VASECTOMY  1994    SOCIAL HISTORY: Social History   Tobacco Use  . Smoking status: Former Research scientist (life sciences)  . Smokeless tobacco: Never Used  Substance Use Topics  . Alcohol use: Yes    Comment: social, 3-4 drinks month  . Drug use: No    FAMILY HISTORY: Family History  Problem Relation Age of Onset  . Heart disease Mother   . Hyperlipidemia Mother   . Stroke Mother   . Diabetes Mother   . Depression Mother   . Alzheimer's disease Mother   . Stroke Father   . Hyperlipidemia Father   . Heart disease Father   . Diabetes Father   . Hypertension Father   . Obesity Father   . Alcohol abuse Sister   . Depression Sister       ROS: Review of Systems  Constitutional: Positive for weight loss.  Endo/Heme/Allergies:       Negative for hypoglycemia    PHYSICAL EXAM: Blood pressure 104/63, pulse 88, temperature 97.8 F (36.6 C), temperature source Oral, height 5\' 10"  (1.778  m), weight 257 lb (116.6 kg), SpO2 96 %. Body mass index is 36.88 kg/m. Physical Exam  Constitutional: He is oriented to person, place, and time. He appears well-developed and well-nourished.  Cardiovascular: Normal rate.  Pulmonary/Chest: Effort normal.  Musculoskeletal: Normal range of motion.  Neurological: He is oriented to person, place, and time.  Skin: Skin is warm and dry.  Psychiatric: He has a normal mood and affect. His behavior is normal.  Vitals reviewed.   RECENT LABS AND TESTS: BMET    Component Value Date/Time   NA 136 02/14/2018 0848   K 4.8 02/14/2018 0848   CL 97 02/14/2018 0848   CO2 24 02/14/2018 0848   GLUCOSE 172 (H) 02/14/2018 0848   GLUCOSE 149 (H) 02/04/2018 1007   BUN 26 02/14/2018 0848   CREATININE 1.00 02/14/2018 0848   CREATININE 0.94 01/18/2017 0832   CALCIUM 10.2 02/14/2018 0848   GFRNONAA 79 02/14/2018 0848   GFRAA 91 02/14/2018 0848   Lab Results  Component Value Date   HGBA1C 7.6 (H) 02/04/2018   HGBA1C 7.1 11/05/2017   HGBA1C 8.6 04/29/2017   HGBA1C 8.9 (H) 01/18/2017   HGBA1C 9.3 (H) 07/31/2016   No results found for: INSULIN CBC    Component Value Date/Time   WBC 7.2 02/14/2018 0848   WBC 6.2 05/21/2014 1541   RBC 4.63 02/14/2018 0848   RBC 4.45 05/21/2014 1541   HGB 13.4 02/14/2018 0848   HCT 39.3 02/14/2018 0848   PLT 211.0 05/21/2014 1541   MCV 85 02/14/2018 0848   MCH 28.9 02/14/2018 0848   MCHC 34.1 02/14/2018 0848   MCHC 33.3 05/21/2014 1541   RDW 14.1 02/14/2018 0848   LYMPHSABS 2.5 02/14/2018 0848   MONOABS 0.4 05/21/2014 1541   EOSABS 0.2 02/14/2018 0848   BASOSABS 0.0 02/14/2018 0848   Iron/TIBC/Ferritin/ %Sat No results found for: IRON, TIBC,  FERRITIN, IRONPCTSAT Lipid Panel     Component Value Date/Time   CHOL 167 02/04/2018 1007   TRIG 171.0 (H) 02/04/2018 1007   HDL 50.30 02/04/2018 1007   CHOLHDL 3 02/04/2018 1007   VLDL 34.2 02/04/2018 1007   LDLCALC 82 02/04/2018 1007   LDLDIRECT 112.0 07/31/2016 1116   Hepatic Function Panel     Component Value Date/Time   PROT 6.8 02/14/2018 0848   ALBUMIN 4.4 02/14/2018 0848   AST 16 02/14/2018 0848   ALT 23 02/14/2018 0848   ALKPHOS 35 (L) 02/14/2018 0848   BILITOT 0.4 02/14/2018 0848      Component Value Date/Time   TSH 2.030 02/14/2018 0848   TSH 1.46 05/21/2014 1541   TSH 2.50 05/10/2013 0832    ASSESSMENT AND PLAN: Type 2 diabetes mellitus without complication, without long-term current use of insulin (HCC)  Class 2 severe obesity with serious comorbidity and body mass index (BMI) of 38.0 to 38.9 in adult, unspecified obesity type ([place])  PLAN:  Diabetes II Yerik has been given extensive diabetes education by myself today including ideal fasting and post-prandial blood glucose readings, individual ideal Hgb A1c goals and hypoglycemia prevention. We discussed the importance of good blood sugar control to decrease the likelihood of diabetic complications such as nephropathy, neuropathy, limb loss, blindness, coronary artery disease, and death. We discussed the importance of intensive lifestyle modification including diet, exercise and weight loss as the first line treatment for diabetes. Taijuan was advised that it is okay to add in melon of all types, as they are lower in sugar than most fruits. Silvestre agrees to  continue with diet, exercise and weight loss. He agrees to check his blood sugar two times daily. Bernadette agrees to continue his diabetes medications and will follow up at the agreed upon time.  We spent > than 50% of the 30 minute visit on the counseling as documented in the note.  Obesity Marie is currently in the action stage of change. As such, his goal  is to continue with weight loss efforts He has agreed to follow the Category 4 plan Gaven has been instructed to work up to a goal of 150 minutes of combined cardio and strengthening exercise per week for weight loss and overall health benefits. We discussed the following Behavioral Modification Strategies today: increasing lean protein intake, travel eating strategies  and ways to avoid boredom eating  Marlan has agreed to follow up with our clinic in 2 to 3 weeks. He was informed of the importance of frequent follow up visits to maximize his success with intensive lifestyle modifications for his multiple health conditions.   OBESITY BEHAVIORAL INTERVENTION VISIT  Today's visit was # 4 out of 22.  Starting weight: 272 lbs Starting date: 02/14/18 Today's weight : 257 lbs Today's date: 04/05/2018 Total lbs lost to date: 15 (Patients must lose 7 lbs in the first 6 months to continue with counseling)   ASK: We discussed the diagnosis of obesity with Margarita Rana today and Ibrohim agreed to give [name] permission to discuss obesity behavioral modification therapy today.  ASSESS: Melody has the diagnosis of obesity and his BMI today is 36.88 Friend is in the action stage of change   ADVISE: Khaliq was educated on the multiple health risks of obesity as well as the benefit of weight loss to improve his health. He was advised of the need for long term treatment and the importance of lifestyle modifications.  AGREE: Multiple dietary modification options and treatment options were discussed and  Taryn agreed to the above obesity treatment plan.  I, Doreene Nest, am acting as transcriptionist for Dennard Nip, MD  I have reviewed the above documentation for accuracy and completeness, and I agree with the above. -Dennard Nip, MD

## 2018-04-11 ENCOUNTER — Other Ambulatory Visit (INDEPENDENT_AMBULATORY_CARE_PROVIDER_SITE_OTHER): Payer: Self-pay | Admitting: Family Medicine

## 2018-04-11 DIAGNOSIS — F3289 Other specified depressive episodes: Secondary | ICD-10-CM

## 2018-04-14 ENCOUNTER — Other Ambulatory Visit: Payer: Self-pay | Admitting: Family Medicine

## 2018-04-20 ENCOUNTER — Ambulatory Visit (INDEPENDENT_AMBULATORY_CARE_PROVIDER_SITE_OTHER): Payer: Medicare Other | Admitting: Neurology

## 2018-04-20 DIAGNOSIS — G4720 Circadian rhythm sleep disorder, unspecified type: Secondary | ICD-10-CM

## 2018-04-20 DIAGNOSIS — E669 Obesity, unspecified: Secondary | ICD-10-CM

## 2018-04-20 DIAGNOSIS — G4719 Other hypersomnia: Secondary | ICD-10-CM

## 2018-04-20 DIAGNOSIS — R519 Headache, unspecified: Secondary | ICD-10-CM

## 2018-04-20 DIAGNOSIS — G4733 Obstructive sleep apnea (adult) (pediatric): Secondary | ICD-10-CM

## 2018-04-20 DIAGNOSIS — G4761 Periodic limb movement disorder: Secondary | ICD-10-CM

## 2018-04-20 DIAGNOSIS — R351 Nocturia: Secondary | ICD-10-CM

## 2018-04-20 DIAGNOSIS — R51 Headache: Secondary | ICD-10-CM

## 2018-04-20 DIAGNOSIS — G2581 Restless legs syndrome: Secondary | ICD-10-CM

## 2018-04-21 ENCOUNTER — Telehealth: Payer: Self-pay

## 2018-04-21 NOTE — Progress Notes (Signed)
Patient referred by Dr. Anitra Lauth, seen by me on 03/29/18, diagnostic PSG on 04/20/18.   Please call and notify the patient that the recent sleep study showed overall mild/near moderate obstructive sleep apnea, moderate during REM sleep and severe in supine sleep with a total AHI of 14.4/hour, REM AHI of 17.1/hour, and O2 nadir of 79%. I recommend treatment for this in the form of CPAP. This will require a repeat sleep study for proper titration and mask fitting and correct monitoring of the oxygen saturations. Please explain to patient. I have placed an order in the chart. Thanks.  [name] Age, MD, PhD [facility] [place])

## 2018-04-21 NOTE — Addendum Note (Signed)
Addended by: Star Age on: 04/21/2018 11:58 AM   Modules accepted: Orders

## 2018-04-21 NOTE — Procedures (Signed)
PATIENT'S NAME:  [name], [name] DOB:      [birth year]      MR#:    [id number]     DATE OF RECORDING: 04/20/2018 REFERRING M.D.:  [name] Dapper MD Study Performed:   Baseline Polysomnogram HISTORY: 67 year old man with a history of hypertension, cardiomegaly, hyperlipidemia, depression, degenerative disc disease, BPH, allergic rhinitis, type 2 diabetes, and obesity, who was previously diagnosed with obstructive sleep apnea. He could not tolerate CPAP at the time. The patient endorsed the Epworth Sleepiness Scale at 12/24 points. The patient's weight 64 pounds with a height of 70 (inches), resulting in a BMI of 9.2 kg/m2. The patient's neck circumference measured 17.5 inches.  CURRENT MEDICATIONS: Albuterol, Aspirin, Lipitor, Fenofibric acid, Mucinex, Lexapro, Proscar, Flonase, Prinivil, Glucophage, Aleve, Fish oil, Flomax, Victoza, Vitamin B12.   PROCEDURE:  This is a multichannel digital polysomnogram utilizing the Somnostar 11.2 system.  Electrodes and sensors were applied and monitored per AASM Specifications.   EEG, EOG, Chin and Limb EMG, were sampled at 200 Hz.  ECG, Snore and Nasal Pressure, Thermal Airflow, Respiratory Effort, CPAP Flow and Pressure, Oximetry was sampled at 50 Hz. Digital video and audio were recorded.      BASELINE STUDY  Lights Out was at 22:24 and Lights On at 04:58.  Total recording time (TRT) was 395 minutes, with a total sleep time (TST) of  316.5 minutes.   The patient's sleep latency was 29 minutes. REM latency was 171 minutes, which is delayed. The sleep efficiency was 80.1%.     SLEEP ARCHITECTURE: WASO (Wake after sleep onset) was 64.5 minutes with mild to moderate sleep fragmentation noted. There were 4.5 minutes in Stage N1, 256 minutes Stage N2, 10.5 minutes Stage N3 and 45.5 minutes in Stage REM.  The percentage of Stage N1 was 1.4%, Stage N2 was 80.9%, which is markedly increased, Stage N3 was 3.3% and Stage R (REM sleep) was 14.4%, which is reduced. The  arousals were noted as: 76 were spontaneous, 140 were associated with PLMs, 52 were associated with respiratory events.  RESPIRATORY ANALYSIS:  There were a total of 76 respiratory events:  1 obstructive apneas, 0 central apneas and 0 mixed apneas with a total of 1 apneas and an apnea index (AI) of .2 /hour. There were 75 hypopneas with a hypopnea index of 14.2 /hour. The patient also had 0 respiratory event related arousals (RERAs).      The total APNEA/HYPOPNEA INDEX (AHI) was 14.4/hour and the total RESPIRATORY DISTURBANCE INDEX was 0. 14.4 /hour.  13 events occurred in REM sleep and 124 events in NREM. The REM AHI was 17.1/hour, versus a non-REM AHI of 13.9. The patient spent 9.5 minutes of total sleep time in the supine position and 307 minutes in non-supine. The supine AHI was 113.7 versus a non-supine AHI of 11.3.  OXYGEN SATURATION & C02:  The Wake baseline 02 saturation was 86%, but there were some errors with the O2 sensor, with the lowest being 79%. Time spent below 89% saturation equaled 198 minutes.    PERIODIC LIMB MOVEMENTS: The patient had a total of 539 Periodic Limb Movements.  The Periodic Limb Movement (PLM) index was 102.2/hour and the PLM Arousal index was 26.5/hour.  Audio and video analysis did not show any abnormal or unusual movements, behaviors, phonations or vocalizations. The patient took 2 bathroom breaks. Snoring was noted. The EKG was in keeping with normal sinus rhythm (NSR).  Post-study, the patient indicated that sleep was better than usual.   IMPRESSION:  1. Obstructive Sleep Apnea (OSA) 2. Periodic Limb Movement Disorder (PLMD) 3. Dysfunctions associated with sleep stages or arousal from sleep  RECOMMENDATIONS:  1. This study demonstrates overall mild/near moderate obstructive sleep apnea, moderate during REM sleep and severe in supine sleep with a total AHI of 14.4/hour, REM AHI of 17.1/hour, and O2 nadir of 79%. Given the patient's medical history and  sleep related complaints, a full-night CPAP titration study is recommended to optimize therapy. Other treatment options may include avoidance of supine sleep position along with weight loss, upper airway or jaw surgery in selected patients or the use of an oral appliance in certain patients. ENT evaluation and/or consultation with a maxillofacial surgeon or dentist may be feasible in some instances.    2. Severe PLMs (periodic limb movements of sleep) were noted during this study with moderate arousals; clinical correlation is recommended. Medication effect from the antidepressant medication should be considered.  3. This study shows sleep fragmentation and abnormal sleep stage percentages; these are nonspecific findings and per se do not signify an intrinsic sleep disorder or a cause for the patient's sleep-related symptoms. Causes include (but are not limited to) the first night effect of the sleep study, circadian rhythm disturbances, medication effect or an underlying mood disorder or medical problem.  4. The patient should be cautioned not to drive, work at heights, or operate dangerous or heavy equipment when tired or sleepy. Review and reiteration of good sleep hygiene measures should be pursued with any patient. 5. The patient will be seen in follow-up by Dr. Rexene [name] at [facility] for discussion of the test results and further management strategies. The referring provider will be notified of the test results.  I certify that I have reviewed the entire raw data recording prior to the issuance of this report in accordance with the Standards of Accreditation of the American Academy of Sleep Medicine (AASM)   [name] Age, MD, PhD Diplomat, American Board of Psychiatry and Neurology (Neurology and Sleep Medicine)

## 2018-04-21 NOTE — Telephone Encounter (Signed)
I called pt. I advised pt that Dr. Rexene Alberts reviewed their sleep study results and found that pt has mild/ near moderate osa and recommends that pt be treated with a cpap. Pt had moderate osa during REM sleep and then severe in supine sleep, with an O2 nadir of 79%. Dr. Rexene Alberts recommends that pt return for a repeat sleep study in order to properly titrate the cpap and ensure a good mask fit. Pt is agreeable to returning for a titration study. I advised pt that our sleep lab will file with pt's insurance and call pt to schedule the sleep study when we hear back from the pt's insurance regarding coverage of this sleep study. Pt verbalized understanding of results. Pt had no questions at this time but was encouraged to call back if questions arise.

## 2018-04-21 NOTE — Telephone Encounter (Signed)
-----   Message from Star Age, MD sent at 04/21/2018 11:58 AM EDT ----- Patient referred by Dr. Anitra Lauth, seen by me on 03/29/18, diagnostic PSG on 04/20/18.   Please call and notify the patient that the recent sleep study showed overall mild/near moderate obstructive sleep apnea, moderate during REM sleep and severe in supine sleep with a total AHI of 14.4/hour, REM AHI of 17.1/hour, and O2 nadir of 79%. I recommend treatment for this in the form of CPAP. This will require a repeat sleep study for proper titration and mask fitting and correct monitoring of the oxygen saturations. Please explain to patient. I have placed an order in the chart. Thanks.  Star Age, MD, PhD [facility])

## 2018-04-27 ENCOUNTER — Ambulatory Visit (INDEPENDENT_AMBULATORY_CARE_PROVIDER_SITE_OTHER): Payer: Medicare Other | Admitting: Family Medicine

## 2018-04-27 VITALS — BP 103/60 | HR 90 | Temp 98.2°F | Ht 70.0 in | Wt 256.0 lb

## 2018-04-27 DIAGNOSIS — E119 Type 2 diabetes mellitus without complications: Secondary | ICD-10-CM

## 2018-04-27 DIAGNOSIS — Z6836 Body mass index (BMI) 36.0-36.9, adult: Secondary | ICD-10-CM | POA: Diagnosis not present

## 2018-04-27 DIAGNOSIS — Z794 Long term (current) use of insulin: Secondary | ICD-10-CM | POA: Diagnosis not present

## 2018-04-27 DIAGNOSIS — F3289 Other specified depressive episodes: Secondary | ICD-10-CM

## 2018-04-27 MED ORDER — LIRAGLUTIDE 18 MG/3ML ~~LOC~~ SOPN: PEN_INJECTOR | SUBCUTANEOUS | 0 refills | Status: DC

## 2018-04-28 NOTE — Progress Notes (Signed)
Office: [phone]  /  Fax: [phone]   HPI:   Chief Complaint: OBESITY [name] [name] is here to discuss his progress with his obesity treatment plan. He is on the Category 4 plan and is following his eating plan approximately 80 % of the time. He states he is walking the dogs for 15-20 minutes 1-2 times per week. [name] [name] continues to lose weight but notes excessive hunger which he feels may be due to cravings actually.  His weight is 256 lb (116.1 kg) today and has had a weight loss of 1 pound over a period of 3 weeks since his last visit. He has lost 16 lbs since starting treatment with [name].  Diabetes II [name] [name] has a diagnosis of diabetes type II. [name] [name] states fasting BGs range between 98 and 135 and 2 hour post prandial range between 120 and 206, mostly 130 and 150's. He denies any hypoglycemic episodes. Last A1c was 7.6 on 02/04/18. He has been working on intensive lifestyle modifications including diet, exercise, and weight loss to help control his blood glucose levels.  Depression with emotional eating behaviors [name] [name] is struggling with emotional eating and using food for comfort to the extent that it is negatively impacting his health, and he would like to discuss options. He often snacks when he is not hungry. [name] [name] sometimes feels he is out of control and then feels guilty that he made poor food choices. He has been working on behavior modification techniques to help reduce his emotional eating and has been somewhat successful. He shows no sign of suicidal or homicidal ideations.  Depression screen [name] [name] 2/9 02/14/2018 02/04/2018 11/05/2017 07/08/2017 05/27/2017  Decreased Interest 2 0 0 0 0  Down, Depressed, Hopeless 1 1 1  0 1  PHQ - 2 Score 3 1 1  0 1  Altered sleeping 1 3 3  - -  Tired, decreased energy 3 1 2  - -  Change in appetite 2 0 1 - -  Feeling bad or failure about yourself  1 0 1 - -  Trouble concentrating 3 0 1 - -  Moving slowly or fidgety/restless 1 0 0 - -  Suicidal thoughts 0  0 0 - -  PHQ-9 Score 14 5 9  - -  Difficult doing work/chores Not difficult at all Not difficult at all Not difficult at all - -    ALLERGIES: Allergies  Allergen Reactions  . Simvastatin Other (See Comments)    myalgias  . Antihistamines, Diphenhydramine-Type Other (See Comments)    depression  . Codeine Nausea Only  . Penicillins Hives  . Sulfa Antibiotics Hives    MEDICATIONS: Current Outpatient Medications on File Prior to Visit  Medication Sig Dispense Refill  . albuterol (PROVENTIL HFA;VENTOLIN HFA) 108 (90 Base) MCG/ACT inhaler Inhale into the lungs every 6 (six) hours as needed for wheezing or shortness of breath.    [name] aspirin 325 MG tablet Take 325 mg by mouth daily.      [name] atorvastatin (LIPITOR) 80 MG tablet TAKE 1 TABLET BY MOUTH ONCE DAILY 90 tablet 1  . Choline Fenofibrate (FENOFIBRIC ACID) 135 MG CPDR TAKE 1 CAPSULE BY MOUTH ONCE DAILY 90 capsule 1  . escitalopram (LEXAPRO) 20 MG tablet Take 1 tablet (20 mg total) by mouth daily. 30 tablet 0  . finasteride (PROSCAR) 5 MG tablet TAKE 1 TABLET BY MOUTH ONCE DAILY 90 tablet 0  . fluticasone (FLONASE) 50 MCG/ACT nasal spray Place into both nostrils daily.    . Insulin Detemir (LEVEMIR FLEXTOUCH) 100 UNIT/ML  Pen Inject 60 Units into the skin daily at 10 pm. 52 U SQ qhs 5 pen 3  . Insulin Pen Needle (BD PEN NEEDLE NANO U/F) 32G X 4 MM MISC USE FOR INSULIN INJECTION FOUR TIMES A DAY 100 each 12  . lisinopril (PRINIVIL,ZESTRIL) 20 MG tablet Take 1 tablet (20 mg total) by mouth daily. 90 tablet 1  . MELATONIN PO Take by mouth.    . metFORMIN (GLUCOPHAGE) 500 MG tablet TAKE 2 TABLETS BY MOUTH TWICE A DAY WITH FOOD 360 tablet 1  . Naproxen Sodium (ALEVE) 220 MG CAPS Take 2 capsules by mouth at bedtime as needed.      . Omega-3 Fatty Acids (FISH OIL) 1000 MG CAPS Take 1 capsule by mouth 2 (two) times daily.    . ONE TOUCH ULTRA TEST test strip TEST THREE TIMES DAILY 100 each 11  . SALINE NASAL SPRAY NA Place into the nose daily as  needed.    . tamsulosin (FLOMAX) 0.4 MG CAPS capsule TAKE 2 CAPSULES BY MOUTH AT BEDTIME 180 capsule 1  . vitamin B-12 (CYANOCOBALAMIN) 1000 MCG tablet Take 1,000 mcg by mouth daily.       No current facility-administered medications on file prior to visit.     PAST MEDICAL HISTORY: Past Medical History:  Diagnosis Date  . Allergic rhinitis, unspecified    Allergy testing via allergist 04/2017--mostly neg.  Dr. Donneta Romberg recommended referral to ENT so i did this.  . Back pain   . BPH (benign prostatic hypertrophy)   . Chronic nasal congestion   . DDD (degenerative disc disease), lumbar 2010   laminectomy 01/2009  . Depression    Hospitalized for suicidal ideation 68.  Has been on lexapro since 10/2008.  . Diabetes mellitus type II    Dx'd approximately 06/2008.  No retinopathy as of 02/2017 ophth.  . Erectile dysfunction   . Erectile dysfunction   . History of colon polyps 01/2016   Recall 3 yrs  . History of pneumonia 2008   Hospitalized  . History of scarlet fever    age 50  . Hyperlipidemia, mixed 1993   myalgias on pravastatin  . Hypertension 2009   "marked chronic cardiomegaly" on CXR 01/2009 per old records.  . 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 Insomnia   . Keratoconus    dx'd 1973  . Memory changes   . OSA (obstructive sleep apnea) 2004   intolerant of CPAP  . Osteoarthritis of both knees    Mild plain film changes in medial compartment bilat  . Restless leg   . Rhinitis, chronic   . 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 APPENDECTOMY  1972  . COLONOSCOPY  X 4   Most recent was 2007 [place], [place]), with removal of polyps in the first two.  02/21/16: tubular adenoma.  Recall 3 yrs (Dr. Ardis Hughs).  . 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 ELECTROCARDIOGRAM  01/29/2009   NORMAL  . LUMBAR LAMINECTOMY  01/2009  .  TONSILLECTOMY AND ADENOIDECTOMY     age 82  . URETHRAL DILATION     2 as a child, one as an adult  . VASECTOMY  1994    SOCIAL HISTORY: Social History   Tobacco Use  . Smoking status: Former Research scientist (life sciences)  . Smokeless tobacco: Never  Used  Substance Use Topics  . Alcohol use: Yes    Comment: social, 3-4 drinks month  . Drug use: No    FAMILY HISTORY: Family History  Problem Relation Age of Onset  . Heart disease Mother   . Hyperlipidemia Mother   . Stroke Mother   . Diabetes Mother   . Depression Mother   . Alzheimer's disease Mother   . Stroke Father   . Hyperlipidemia Father   . Heart disease Father   . Diabetes Father   . Hypertension Father   . Obesity Father   . Alcohol abuse Sister   . Depression Sister     ROS: Review of Systems  Constitutional: Positive for weight loss.  Endo/Heme/Allergies:       Negative hypoglycemia  Psychiatric/Behavioral: Positive for depression. Negative for suicidal ideas.    PHYSICAL EXAM: Blood pressure 103/60, pulse 90, temperature 98.2 F (36.8 C), temperature source Oral, height 5\' 10"  (1.778 m), weight 256 lb (116.1 kg), SpO2 95 %. Body mass index is 36.73 kg/m. Physical Exam  Constitutional: He is oriented to person, place, and time. He appears well-developed and well-nourished.  Cardiovascular: Normal rate.  Pulmonary/Chest: Effort normal.  Musculoskeletal: Normal range of motion.  Neurological: He is oriented to person, place, and time.  Skin: Skin is warm and dry.  Psychiatric: He has a normal mood and affect. His behavior is normal.  Vitals reviewed.   RECENT LABS AND TESTS: BMET    Component Value Date/Time   NA 136 02/14/2018 0848   K 4.8 02/14/2018 0848   CL 97 02/14/2018 0848   CO2 24 02/14/2018 0848   GLUCOSE 172 (H) 02/14/2018 0848   GLUCOSE 149 (H) 02/04/2018 1007   BUN 26 02/14/2018 0848   CREATININE 1.00 02/14/2018 0848   CREATININE 0.94 01/18/2017 0832   CALCIUM 10.2 02/14/2018 0848   GFRNONAA 79  02/14/2018 0848   GFRAA 91 02/14/2018 0848   Lab Results  Component Value Date   HGBA1C 7.6 (H) 02/04/2018   HGBA1C 7.1 11/05/2017   HGBA1C 8.6 04/29/2017   HGBA1C 8.9 (H) 01/18/2017   HGBA1C 9.3 (H) 07/31/2016   No results found for: INSULIN CBC    Component Value Date/Time   WBC 7.2 02/14/2018 0848   WBC 6.2 05/21/2014 1541   RBC 4.63 02/14/2018 0848   RBC 4.45 05/21/2014 1541   HGB 13.4 02/14/2018 0848   HCT 39.3 02/14/2018 0848   PLT 211.0 05/21/2014 1541   MCV 85 02/14/2018 0848   MCH 28.9 02/14/2018 0848   MCHC 34.1 02/14/2018 0848   MCHC 33.3 05/21/2014 1541   RDW 14.1 02/14/2018 0848   LYMPHSABS 2.5 02/14/2018 0848   MONOABS 0.4 05/21/2014 1541   EOSABS 0.2 02/14/2018 0848   BASOSABS 0.0 02/14/2018 0848   Iron/TIBC/Ferritin/ %Sat No results found for: IRON, TIBC, FERRITIN, IRONPCTSAT Lipid Panel     Component Value Date/Time   CHOL 167 02/04/2018 1007   TRIG 171.0 (H) 02/04/2018 1007   HDL 50.30 02/04/2018 1007   CHOLHDL 3 02/04/2018 1007   VLDL 34.2 02/04/2018 1007   LDLCALC 82 02/04/2018 1007   LDLDIRECT 112.0 07/31/2016 1116   Hepatic Function Panel     Component Value Date/Time   PROT 6.8 02/14/2018 0848   ALBUMIN 4.4 02/14/2018 0848   AST 16 02/14/2018 0848   ALT 23 02/14/2018 0848   ALKPHOS 35 (L) 02/14/2018 0848   BILITOT 0.4 02/14/2018 0848      Component Value Date/Time  TSH 2.030 02/14/2018 0848   TSH 1.46 05/21/2014 1541   TSH 2.50 05/10/2013 0832    ASSESSMENT AND PLAN: Type 2 diabetes mellitus without complication, with long-term current use of insulin (HCC) - Plan: liraglutide (VICTOZA) 18 MG/3ML SOPN  Other depression - with emotional eating  Class 2 severe obesity with serious comorbidity and body mass index (BMI) of 36.0 to 36.9 in adult, unspecified obesity type ([place])  PLAN:  Diabetes II [name] [name] has been given extensive diabetes education by myself today including ideal fasting and post-prandial blood glucose readings,  individual ideal Hgb A1c goals  and hypoglycemia prevention. We discussed the importance of good blood sugar control to decrease the likelihood of diabetic complications such as nephropathy, neuropathy, limb loss, blindness, coronary artery disease, and death. We discussed the importance of intensive lifestyle modification including diet, exercise and weight loss as the first line treatment for diabetes. [name] [name] agrees to continue Victoza 0.6 mg q AM and we will refill for 1 month. [name] [name] agrees to follow up with our clinic in 2 to 3 weeks.  Depression with Emotional Eating Behaviors We discussed behavior modification techniques today to help [name] [name] deal with his emotional eating and depression. [name] [name] was offered medications but chose to defer, we will reassess him at next visit. [name] [name] agrees to follow up with our clinic in 2 to 3 weeks.  Obesity [name] [name] is currently in the action stage of change. As such, his goal is to continue with weight loss efforts He has agreed to follow the Category 4 plan [name] [name] has been instructed to work up to a goal of 150 minutes of combined cardio and strengthening exercise per week for weight loss and overall health benefits. We discussed the following Behavioral Modification Strategies today: increasing lean protein intake, decrease eating out, and no skipping meals   [name] [name] has agreed to follow up with our clinic in 2 to 3 weeks. He was informed of the importance of frequent follow up visits to maximize his success with intensive lifestyle modifications for his multiple health conditions.   OBESITY BEHAVIORAL INTERVENTION VISIT  Today's visit was # 5 out of 22.  Starting weight: 272 lbs Starting date: 02/14/18 Today's weight : 256 lbs Today's date: 04/27/2018 Total lbs lost to date: 16 (Patients must lose 7 lbs in the first 6 months to continue with counseling)   ASK: We discussed the diagnosis of obesity with [name] [name] today and [name] [name] agreed to  give [name] permission to discuss obesity behavioral modification therapy today.  ASSESS: [name] [name] has the diagnosis of obesity and his BMI today is 36.73 [name] [name] is in the action stage of change   ADVISE: [name] [name] was educated on the multiple health risks of obesity as well as the benefit of weight loss to improve his health. He was advised of the need for long term treatment and the importance of lifestyle modifications.  AGREE: Multiple dietary modification options and treatment options were discussed and  [place] agreed to the above obesity treatment plan.  I, Trixie Dredge, am acting as transcriptionist for Dennard Nip, MD  I have reviewed the above documentation for accuracy and completeness, and I agree with the above. -Dennard Nip, MD

## 2018-05-02 ENCOUNTER — Other Ambulatory Visit (INDEPENDENT_AMBULATORY_CARE_PROVIDER_SITE_OTHER): Payer: Self-pay | Admitting: Family Medicine

## 2018-05-02 DIAGNOSIS — F3289 Other specified depressive episodes: Secondary | ICD-10-CM

## 2018-05-09 ENCOUNTER — Encounter: Payer: Self-pay | Admitting: Family Medicine

## 2018-05-09 ENCOUNTER — Ambulatory Visit (INDEPENDENT_AMBULATORY_CARE_PROVIDER_SITE_OTHER): Payer: Medicare Other | Admitting: Family Medicine

## 2018-05-09 VITALS — BP 116/76 | HR 76 | Temp 98.3°F | Resp 16 | Ht 70.0 in | Wt 263.0 lb

## 2018-05-09 DIAGNOSIS — N3281 Overactive bladder: Secondary | ICD-10-CM | POA: Diagnosis not present

## 2018-05-09 DIAGNOSIS — N138 Other obstructive and reflux uropathy: Secondary | ICD-10-CM

## 2018-05-09 DIAGNOSIS — Z794 Long term (current) use of insulin: Secondary | ICD-10-CM | POA: Diagnosis not present

## 2018-05-09 DIAGNOSIS — I10 Essential (primary) hypertension: Secondary | ICD-10-CM

## 2018-05-09 DIAGNOSIS — E7800 Pure hypercholesterolemia, unspecified: Secondary | ICD-10-CM | POA: Diagnosis not present

## 2018-05-09 DIAGNOSIS — N401 Enlarged prostate with lower urinary tract symptoms: Secondary | ICD-10-CM | POA: Diagnosis not present

## 2018-05-09 DIAGNOSIS — Z125 Encounter for screening for malignant neoplasm of prostate: Secondary | ICD-10-CM

## 2018-05-09 DIAGNOSIS — Z9989 Dependence on other enabling machines and devices: Secondary | ICD-10-CM

## 2018-05-09 DIAGNOSIS — E118 Type 2 diabetes mellitus with unspecified complications: Secondary | ICD-10-CM | POA: Diagnosis not present

## 2018-05-09 DIAGNOSIS — G4733 Obstructive sleep apnea (adult) (pediatric): Secondary | ICD-10-CM | POA: Diagnosis not present

## 2018-05-09 LAB — PSA, MEDICARE: PSA: 0.04 ng/ml — ABNORMAL LOW (ref 0.10–4.00)

## 2018-05-09 LAB — HEMOGLOBIN A1C: Hgb A1c MFr Bld: 6.5 % (ref 4.6–6.5)

## 2018-05-09 MED ORDER — OXYBUTYNIN CHLORIDE 5 MG PO TABS: ORAL_TABLET | ORAL | 2 refills | Status: DC

## 2018-05-09 NOTE — Progress Notes (Signed)
OFFICE VISIT  05/12/2018   CC:  Chief Complaint  Patient presents with  . Follow-up    RCI   HPI:    Patient is a 67 y.o. Caucasian male who presents for 3 mo f/u DM 2, HTN, HLD. Of note, he does have confirmed OSA with recent sleep study (mild/mod-->results in EMR 04/21/18). Titration sleep study was recommended--> this is set for 2 days from now.  DM 2: last visit we increased his lantus to 60 U qAM and 50 U qhs. On a wt loss program through [facility], wt loss noted and BS's better. Fastings and 2H PP avg 150s, some 115, some over 200. One period of lightheadedness that lasted a couple days--May 13 or so--glucose 127 was lowest during this time. Has not anything like this before or since.  No vertigo.  HTN: no home monitoring.  HLD: taking high dose atorvastatin w/out side effect.  ROS: no CP, no SOB, no palpitations, no HAs, no melena or hematochezia. Gets up q2h in night to urinate.  Does have hesitancy sometimes, feels no sense of incomplete empying. During day he is not experiencing excessive frequency.  Currently takes flomax 0.8mg  qhs. No caffeine in evenings, limits fluids in evenings.   Past Medical History:  Diagnosis Date  . Allergic rhinitis, unspecified    Allergy testing via allergist 04/2017--mostly neg.  Dr. Donneta Romberg recommended referral to ENT so i did this.  . Back pain   . BPH (benign prostatic hypertrophy)   . Chronic nasal congestion   . DDD (degenerative disc disease), lumbar 2010   laminectomy 01/2009  . Depression    Hospitalized for suicidal ideation 66.  Has been on lexapro since 10/2008.  . Diabetes mellitus type II    Dx'd approximately 06/2008.  No retinopathy as of 02/2017 ophth.  . Erectile dysfunction   . Erectile dysfunction   . History of colon polyps 01/2016   Recall 3 yrs  . History of pneumonia 2008   Hospitalized  . History of scarlet fever    age 70  . Hyperlipidemia, mixed 1993   myalgias on pravastatin  . Hypertension 2009    "marked chronic cardiomegaly" on CXR 01/2009 per old records.  . 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 Insomnia   . Keratoconus    dx'd 1973  . Memory changes   . OSA (obstructive sleep apnea) 2004; 2019   +sleep study 2019, titration study to be done as of 04/20/18 sleep med MD f/u.  . Osteoarthritis of both knees    Mild plain film changes in medial compartment bilat  . Restless leg   . Rhinitis, chronic   . 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 APPENDECTOMY  1972  . COLONOSCOPY  X 4   Most recent was 2007 [place], [place]), with removal of polyps in the first two.  02/21/16: tubular adenoma.  Recall 3 yrs (Dr. Ardis Hughs).  . 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 ELECTROCARDIOGRAM  01/29/2009   NORMAL  . LUMBAR LAMINECTOMY  01/2009  . TONSILLECTOMY AND ADENOIDECTOMY     age 62  . URETHRAL DILATION     2 as a child, one as an adult  . VASECTOMY  1994    Outpatient Medications Prior to Visit  Medication Sig Dispense Refill  . albuterol (PROVENTIL HFA;VENTOLIN  HFA) 108 (90 Base) MCG/ACT inhaler Inhale into the lungs every 6 (six) hours as needed for wheezing or shortness of breath.    [name] aspirin 325 MG tablet Take 325 mg by mouth daily.      [name] atorvastatin (LIPITOR) 80 MG tablet TAKE 1 TABLET BY MOUTH ONCE DAILY 90 tablet 1  . Choline Fenofibrate (FENOFIBRIC ACID) 135 MG CPDR TAKE 1 CAPSULE BY MOUTH ONCE DAILY 90 capsule 1  . escitalopram (LEXAPRO) 20 MG tablet TAKE 1 TABLET(20 MG) BY MOUTH DAILY 30 tablet 0  . finasteride (PROSCAR) 5 MG tablet TAKE 1 TABLET BY MOUTH ONCE DAILY 90 tablet 0  . fluticasone (FLONASE) 50 MCG/ACT nasal spray Place into both nostrils daily.    . Insulin Detemir (LEVEMIR FLEXTOUCH) 100 UNIT/ML Pen Inject 60 Units into the skin daily at 10 pm. 52 U SQ qhs 5 pen 3  . Insulin Pen Needle (BD PEN NEEDLE NANO U/F) 32G X 4 MM MISC USE FOR  INSULIN INJECTION FOUR TIMES A DAY 100 each 12  . liraglutide (VICTOZA) 18 MG/3ML SOPN ADMINISTER 0.6 MG UNDER THE SKIN EVERY MORNING 3 mL 0  . lisinopril (PRINIVIL,ZESTRIL) 20 MG tablet Take 1 tablet (20 mg total) by mouth daily. 90 tablet 1  . MELATONIN PO Take by mouth.    . metFORMIN (GLUCOPHAGE) 500 MG tablet TAKE 2 TABLETS BY MOUTH TWICE A DAY WITH FOOD 360 tablet 1  . Naproxen Sodium (ALEVE) 220 MG CAPS Take 2 capsules by mouth at bedtime as needed.      . Omega-3 Fatty Acids (FISH OIL) 1000 MG CAPS Take 1 capsule by mouth 2 (two) times daily.    . ONE TOUCH ULTRA TEST test strip TEST THREE TIMES DAILY 100 each 11  . SALINE NASAL SPRAY NA Place into the nose daily as needed.    . tamsulosin (FLOMAX) 0.4 MG CAPS capsule TAKE 2 CAPSULES BY MOUTH AT BEDTIME 180 capsule 1  . vitamin B-12 (CYANOCOBALAMIN) 1000 MCG tablet Take 1,000 mcg by mouth daily.       No facility-administered medications prior to visit.     Allergies  Allergen Reactions  . Simvastatin Other (See Comments)    myalgias  . Antihistamines, Diphenhydramine-Type Other (See Comments)    depression  . Codeine Nausea Only  . Penicillins Hives  . Sulfa Antibiotics Hives    ROS As per HPI  PE: Blood pressure 116/76, pulse 76, temperature 98.3 F (36.8 C), temperature source Oral, resp. rate 16, height 5\' 10"  (1.778 m), weight 263 lb (119.3 kg), SpO2 95 %. Gen: Alert, well appearing.  Patient is oriented to person, place, time, and situation. AFFECT: pleasant, lucid thought and speech. Rectal exam: negative without mass, lesions or tenderness, PROSTATE EXAM: smooth and symmetric without nodules or tenderness.   LABS:  Lab Results  Component Value Date   TSH 2.030 02/14/2018   Lab Results  Component Value Date   WBC 7.2 02/14/2018   HGB 13.4 02/14/2018   HCT 39.3 02/14/2018   MCV 85 02/14/2018   PLT 211.0 05/21/2014   Lab Results  Component Value Date   CREATININE 1.00 02/14/2018   BUN 26 02/14/2018    NA 136 02/14/2018   K 4.8 02/14/2018   CL 97 02/14/2018   CO2 24 02/14/2018   Lab Results  Component Value Date   ALT 23 02/14/2018   AST 16 02/14/2018   ALKPHOS 35 (L) 02/14/2018   BILITOT 0.4 02/14/2018   Lab Results  Component  Value Date   CHOL 167 02/04/2018   Lab Results  Component Value Date   HDL 50.30 02/04/2018   Lab Results  Component Value Date   LDLCALC 82 02/04/2018   Lab Results  Component Value Date   TRIG 171.0 (H) 02/04/2018   Lab Results  Component Value Date   CHOLHDL 3 02/04/2018   Lab Results  Component Value Date   PSA 0.04 (L) 05/09/2018   PSA 0.03 (L) 07/04/2015   PSA 0.02 (L) 05/21/2014   Lab Results  Component Value Date   HGBA1C 6.5 05/09/2018   IMPRESSION AND PLAN:  1) DM 2: stable. HbA1c today. Pt is to schedule his diab retpthy eye exam ASAP.  2) HTN: The current medical regimen is effective;  continue present plan and medications. Lytes/cr good 3 mo ago.  3) HLD: tolerating high dose statin. Lipid panel 3 mo ago very good.  4) OAB also with BPH with LUTS: continue proscar and finasteride, and add low dose of oxybutynin qhs (5 mg).  5) Prostate ca screening: DRE normal today.  PSA drawn.  6) Memory loss, short term-->seeing neurologist 05/24/18 for further eval.  An After Visit Summary was printed and given to the patient.  FOLLOW UP: Return in about 3 months (around 08/09/2018).  Signed:  Crissie Sickles, MD           05/12/2018

## 2018-05-10 ENCOUNTER — Encounter: Payer: Self-pay | Admitting: Family Medicine

## 2018-05-11 ENCOUNTER — Ambulatory Visit (INDEPENDENT_AMBULATORY_CARE_PROVIDER_SITE_OTHER): Payer: Medicare Other | Admitting: Neurology

## 2018-05-11 DIAGNOSIS — R51 Headache: Secondary | ICD-10-CM

## 2018-05-11 DIAGNOSIS — G4733 Obstructive sleep apnea (adult) (pediatric): Secondary | ICD-10-CM

## 2018-05-11 DIAGNOSIS — G4720 Circadian rhythm sleep disorder, unspecified type: Secondary | ICD-10-CM

## 2018-05-11 DIAGNOSIS — G4719 Other hypersomnia: Secondary | ICD-10-CM

## 2018-05-11 DIAGNOSIS — G2581 Restless legs syndrome: Secondary | ICD-10-CM

## 2018-05-11 DIAGNOSIS — G4761 Periodic limb movement disorder: Secondary | ICD-10-CM

## 2018-05-11 DIAGNOSIS — R519 Headache, unspecified: Secondary | ICD-10-CM

## 2018-05-11 DIAGNOSIS — E669 Obesity, unspecified: Secondary | ICD-10-CM

## 2018-05-11 DIAGNOSIS — R351 Nocturia: Secondary | ICD-10-CM

## 2018-05-16 ENCOUNTER — Telehealth: Payer: Self-pay

## 2018-05-16 NOTE — Procedures (Signed)
PATIENT'S NAME:  [name] [name], [name] [name] DOB:      19-Jul-1951      MR#:    [id number]     DATE OF RECORDING: 05/11/2018 REFERRING M.D.:  Shawnie Dapper MD Study Performed:   CPAP  Titration HISTORY: 67 year old man with a history of hypertension, cardiomegaly, hyperlipidemia, depression, degenerative disc disease, BPH, allergic rhinitis, type 2 diabetes, and obesity, who presents for a CPAP titration. He was previously diagnosed with obstructive sleep apnea and could not tolerate CPAP at the time. He had a recent baseline PSG on 04/20/18, which showed a total AHI of 14.4/hour, REM AHI of 17.1/hour, and an O2 nadir of 79%. The Epworth Sleepiness Score is 12/24, BMI of 37.9 kg/m2. The patient's neck circumference measured 17 inches.  CURRENT MEDICATIONS: Albuterol, Aspirin, Lipitor, Fenofibric acid, Mucinex, Lexapro, Proscar, Flonase, Prinivil, Glucophage, Aleve, Fish oil, Flomax, Victoza, Vitamin B12.   PROCEDURE:  This is a multichannel digital polysomnogram utilizing the SomnoStar 11.2 system.  Electrodes and sensors were applied and monitored per AASM Specifications.   EEG, EOG, Chin and Limb EMG, were sampled at 200 Hz.  ECG, Snore and Nasal Pressure, Thermal Airflow, Respiratory Effort, CPAP Flow and Pressure, Oximetry was sampled at 50 Hz. Digital video and audio were recorded.      The patient was fitted with a medium Simplus FFM and requested to use a full face mask. CPAP was initiated at 5 cmH20 with heated humidity per AASM standards and pressure was advanced to 17 cmH20 because of hypopneas, apneas and desaturations. At a PAP pressure of 17 cmH20, there was a reduction of the AHI to 0 with supine NREM sleep achieved and O2 nadir of 93%.  Lights Out was at 22:30 and Lights On at 05:04. Total recording time (TRT) was 394 minutes, with a total sleep time (TST) of 296 minutes. The patient's sleep latency was 31 minutes, which is delayed. REM sleep was absent. The sleep efficiency was 75.1%, which is  reduced.   SLEEP ARCHITECTURE: WASO (Wake after sleep onset)  was 84 minutes with mild to moderate sleep fragmentation noted.  There were 45.5 minutes in Stage N1, 180 minutes Stage N2, 70.5 minutes Stage N3 and 0 minutes in Stage REM.  The percentage of Stage N1 was 15.4%, which is markedly increased, Stage N2 was 60.8%, which is mildly increased, Stage N3 was 23.8% and Stage R (REM sleep) was absent. The arousals were noted as: 24 were spontaneous, 79 were associated with PLMs, 49 were associated with respiratory events.  RESPIRATORY ANALYSIS:  There was a total of 73 respiratory events: 32 obstructive apneas, 24 central apneas and 0 mixed apneas with a total of 56 apneas and an apnea index (AI) of 11.4 /hour. There were 17 hypopneas with a hypopnea index of 3.4/hour. The patient also had 0 respiratory event related arousals (RERAs).      The total APNEA/HYPOPNEA INDEX  (AHI) was 14.80. /hour and the total RESPIRATORY DISTURBANCE INDEX was 14.8 0./hour  0 events occurred in REM sleep and 73 events in NREM. The REM AHI was 0 /hour versus a non-REM AHI of 14.8 0./hour.  The patient spent 214 minutes of total sleep time in the supine position and 82 minutes in non-supine. The supine AHI was 20.5, versus a non-supine AHI of 0.0.  OXYGEN SATURATION & C02:  The baseline 02 saturation was 92%, with the lowest being 84%. Time spent below 89% saturation equaled 2 minutes.  PERIODIC LIMB MOVEMENTS:  The patient had a total of  802 Periodic Limb Movements. The Periodic Limb Movement (PLM) index was 162.6 and the PLM Arousal index was 16. /hour.   Audio and video analysis did not show any abnormal or unusual movements, behaviors, phonations or vocalizations. The patient reported a leg cramp and had to get out of bed. The patient took 3 bathroom breaks. The EKG was in keeping with normal sinus rhythm (NSR).  Post-study, the patient indicated that sleep was worse than usual.   IMPRESSION:   1. Obstructive  Sleep Apnea (OSA) 2. Periodic Limb Movement Disorder (PLMD) 3. Dysfunctions associated with sleep stages or arousal from sleep   RECOMMENDATIONS:   1. This study significant improvement of the patient's obstructive sleep apnea with CPAP therapy, however, REM sleep was not achieved. I will advise the patient to start home CPAP treatment at a pressure of 17 cm via medium full face mask with heated humidity. The patient should be reminded to be fully compliant with PAP therapy to improve sleep related symptoms and decrease long term cardiovascular risks. The patient should be reminded, that it may take up to 3 months to get fully used to using PAP with all planned sleep. The earlier full compliance is achieved, the better long term compliance tends to be. Please note that untreated obstructive sleep apnea may carry additional perioperative morbidity. Patients with significant obstructive sleep apnea should receive perioperative PAP therapy and the surgeons and particularly the anesthesiologist should be informed of the diagnosis and the severity of the sleep disordered breathing. 2. Other treatment options for OSA may include avoidance of supine sleep position along with weight loss, upper airway or jaw surgery in selected patients or the use of an oral appliance in certain patients. ENT evaluation and/or consultation with a maxillofacial surgeon or dentist may be feasible in some instances.    3. Severe PLMs (periodic limb movements of sleep) were noted during this study with moderate arousals; clinical correlation is recommended. Medication effect from the antidepressant medication should be considered.  4. This study shows sleep fragmentation and abnormal sleep stage percentages; these are nonspecific findings and per se do not signify an intrinsic sleep disorder or a cause for the patient's sleep-related symptoms. Causes include (but are not limited to) the first night effect of the sleep study, circadian  rhythm disturbances, medication effect or an underlying mood disorder or medical problem.  5. The patient should be cautioned not to drive, work at heights, or operate dangerous or heavy equipment when tired or sleepy. Review and reiteration of good sleep hygiene measures should be pursued with any patient. 6. The patient will be seen in follow-up by Dr. Rexene Alberts at [facility] for discussion of the test results and further management strategies. The referring provider will be notified of the test results.   I certify that I have reviewed the entire raw data recording prior to the issuance of this report in accordance with the Standards of Accreditation of the American Academy of Sleep Medicine (AASM)     Star Age, MD, PhD Diplomat, American Board of Psychiatry and Neurology (Neurology and Sleep Medicine)

## 2018-05-16 NOTE — Progress Notes (Signed)
Patient referred by Dr. Anitra Lauth, seen by me on 03/29/18, diagnostic PSG on 04/20/18. Patient had a CPAP titration study on 05/11/18.  Please call and inform patient that I have entered an order for treatment with positive airway pressure (PAP) treatment for obstructive sleep apnea (OSA). He did reasonably well during the latest sleep study with CPAP. We will, therefore, arrange for a machine for home use through a DME (durable medical equipment) company of His choice; and I will see the patient back in follow-up in about 10 weeks. Please also explain to the patient that I will be looking out for compliance data, which can be downloaded from the machine (stored on an SD card, that is inserted in the machine) or via remote access through a modem, that is built into the machine. At the time of the followup appointment we will discuss sleep study results and how it is going with PAP treatment at home. Please advise patient to bring His machine at the time of the first FU visit, even though this is cumbersome. Bringing the machine for every visit after that will likely not be needed, but often helps for the first visit to troubleshoot if needed. Please re-enforce the importance of compliance with treatment and the need for [name] to monitor compliance data - often an insurance requirement and actually good feedback for the patient as far as how they are doing.  Also remind patient, that any interim PAP machine or mask issues should be first addressed with the DME company, as they can often help better with technical and mask fit issues. Please ask if patient has a preference regarding DME company.  Please also make sure, the patient has a follow-up appointment with me in about 10 weeks from the setup date, thanks. May see one of our nurse practitioners if needed for proper timing of the FU appointment.  Please fax or rout report to the referring provider. Thanks,   Star Age, MD, PhD [facility]  [facility])

## 2018-05-16 NOTE — Telephone Encounter (Signed)
I called pt. I advised pt that Dr. Rexene Alberts reviewed their sleep study results and found that pt did reasonably well with the cpap during his latest sleep study. Dr. Rexene Alberts recommends that pt start a cpap at home. I reviewed PAP compliance expectations with the pt. Pt is agreeable to starting a CPAP. I advised pt that an order will be sent to a DME, [facility], and [employer] will call the pt within about one week after they file with the pt's insurance. [employer] will show the pt how to use the machine, fit for masks, and troubleshoot the CPAP if needed. A follow up appt was made for insurance purposes with Dr. Rexene Alberts on 08/11/18 at 1:00pm. Pt verbalized understanding to arrive 15 minutes early and bring their CPAP. A letter with all of this information in it will be mailed to the pt as a reminder. I verified with the pt that the address we have on file is correct. Pt verbalized understanding of results. Pt had no questions at this time but was encouraged to call back if questions arise.

## 2018-05-16 NOTE — Addendum Note (Signed)
Addended by: Star Age on: 05/16/2018 07:52 AM   Modules accepted: Orders

## 2018-05-16 NOTE — Telephone Encounter (Signed)
-----   Message from Star Age, MD sent at 05/16/2018  7:52 AM EDT ----- Patient referred by Dr. Anitra Lauth, seen by me on 03/29/18, diagnostic PSG on 04/20/18. Patient had a CPAP titration study on 05/11/18.  Please call and inform patient that I have entered an order for treatment with positive airway pressure (PAP) treatment for obstructive sleep apnea (OSA). He did reasonably well during the latest sleep study with CPAP. We will, therefore, arrange for a machine for home use through a DME (durable medical equipment) company of His choice; and I will see the patient back in follow-up in about 10 weeks. Please also explain to the patient that I will be looking out for compliance data, which can be downloaded from the machine (stored on an SD card, that is inserted in the machine) or via remote access through a modem, that is built into the machine. At the time of the followup appointment we will discuss sleep study results and how it is going with PAP treatment at home. Please advise patient to bring His machine at the time of the first FU visit, even though this is cumbersome. Bringing the machine for every visit after that will likely not be needed, but often helps for the first visit to troubleshoot if needed. Please re-enforce the importance of compliance with treatment and the need for [name] to monitor compliance data - often an insurance requirement and actually good feedback for the patient as far as how they are doing.  Also remind patient, that any interim PAP machine or mask issues should be first addressed with the DME company, as they can often help better with technical and mask fit issues. Please ask if patient has a preference regarding DME company.  Please also make sure, the patient has a follow-up appointment with me in about 10 weeks from the setup date, thanks. May see one of our nurse practitioners if needed for proper timing of the FU appointment.  Please fax or rout report to the referring  provider. Thanks,   Star Age, MD, PhD [facility])

## 2018-05-17 ENCOUNTER — Ambulatory Visit (INDEPENDENT_AMBULATORY_CARE_PROVIDER_SITE_OTHER): Payer: Medicare Other | Admitting: Family Medicine

## 2018-05-17 VITALS — BP 102/64 | HR 74 | Temp 98.0°F | Ht 70.0 in | Wt 257.0 lb

## 2018-05-17 DIAGNOSIS — Z6836 Body mass index (BMI) 36.0-36.9, adult: Secondary | ICD-10-CM

## 2018-05-17 DIAGNOSIS — F3289 Other specified depressive episodes: Secondary | ICD-10-CM

## 2018-05-17 DIAGNOSIS — E119 Type 2 diabetes mellitus without complications: Secondary | ICD-10-CM | POA: Diagnosis not present

## 2018-05-17 DIAGNOSIS — Z794 Long term (current) use of insulin: Secondary | ICD-10-CM | POA: Diagnosis not present

## 2018-05-17 MED ORDER — LIRAGLUTIDE 18 MG/3ML ~~LOC~~ SOPN: PEN_INJECTOR | SUBCUTANEOUS | 0 refills | Status: DC

## 2018-05-17 MED ORDER — ESCITALOPRAM OXALATE 20 MG PO TABS: ORAL_TABLET | ORAL | 0 refills | Status: DC

## 2018-05-17 NOTE — Progress Notes (Signed)
Office: [phone]  /  Fax: [phone]   HPI:   Chief Complaint: OBESITY [name] [name] is here to discuss his progress with his obesity treatment plan. He is on the Category 4 plan and is following his eating plan approximately 70 % of the time. He states he is walking for 10 to 15 minutes 2 to 3 times per week. [name] [name] has increased traveling and he has increased eating out. He did well with controlling his portions and making smarter food choices. He is ready to get back on track. [name] [name] still notes struggling with emotional eating and feeling deprived.4 His weight is 257 lb (116.6 kg) today and has had a weight gain of 1 pound over a period of 3 weeks since his last visit. He has lost 15 lbs since starting treatment with [name].  Diabetes II [name] [name] has a diagnosis of diabetes type II. [name] [name] is on Levemir insulin 60 units qHS, Victoza and Metformin. He denies any hypoglycemic episodes. Last A1c was at 7.6 He has been working on intensive lifestyle modifications including diet, exercise, and weight loss to help control his blood glucose levels.  Depression with emotional eating behaviors [name] [name] is still struggling with some emotional eating, but he does not want to consider other medications. [name] [name] struggles with emotional eating and using food for comfort to the extent that it is negatively impacting his health. He often snacks when he is not hungry. [name] [name] sometimes feels he is out of control and then feels guilty that he made poor food choices. He has been working on behavior modification techniques to help reduce his emotional eating and has been somewhat successful. His mood is stable on Lexapro and he shows no sign of suicidal or homicidal ideations.  Depression screen [name] [name] 2/9 02/14/2018 02/04/2018 11/05/2017 07/08/2017 05/27/2017  Decreased Interest 2 0 0 0 0  Down, Depressed, Hopeless 1 1 1  0 1  PHQ - 2 Score 3 1 1  0 1  Altered sleeping 1 3 3  - -  Tired, decreased energy 3 1 2  - -  Change in  appetite 2 0 1 - -  Feeling bad or failure about yourself  1 0 1 - -  Trouble concentrating 3 0 1 - -  Moving slowly or fidgety/restless 1 0 0 - -  Suicidal thoughts 0 0 0 - -  PHQ-9 Score 14 5 9  - -  Difficult doing work/chores Not difficult at all Not difficult at all Not difficult at all - -     ALLERGIES: Allergies  Allergen Reactions  . Simvastatin Other (See Comments)    myalgias  . Antihistamines, Diphenhydramine-Type Other (See Comments)    depression  . Codeine Nausea Only  . Penicillins Hives  . Sulfa Antibiotics Hives    MEDICATIONS: Current Outpatient Medications on File Prior to Visit  Medication Sig Dispense Refill  . albuterol (PROVENTIL HFA;VENTOLIN HFA) 108 (90 Base) MCG/ACT inhaler Inhale into the lungs every 6 (six) hours as needed for wheezing or shortness of breath.    [name] aspirin 325 MG tablet Take 325 mg by mouth daily.      [name] atorvastatin (LIPITOR) 80 MG tablet TAKE 1 TABLET BY MOUTH ONCE DAILY 90 tablet 1  . Choline Fenofibrate (FENOFIBRIC ACID) 135 MG CPDR TAKE 1 CAPSULE BY MOUTH ONCE DAILY 90 capsule 1  . finasteride (PROSCAR) 5 MG tablet TAKE 1 TABLET BY MOUTH ONCE DAILY 90 tablet 0  . fluticasone (FLONASE) 50 MCG/ACT nasal spray Place into both nostrils daily.    [name]  Insulin Detemir (LEVEMIR FLEXTOUCH) 100 UNIT/ML Pen Inject 60 Units into the skin daily at 10 pm. 52 U SQ qhs 5 pen 3  . Insulin Pen Needle (BD PEN NEEDLE NANO U/F) 32G X 4 MM MISC USE FOR INSULIN INJECTION FOUR TIMES A DAY 100 each 12  . lisinopril (PRINIVIL,ZESTRIL) 20 MG tablet Take 1 tablet (20 mg total) by mouth daily. 90 tablet 1  . MELATONIN PO Take by mouth.    . metFORMIN (GLUCOPHAGE) 500 MG tablet TAKE 2 TABLETS BY MOUTH TWICE A DAY WITH FOOD 360 tablet 1  . Naproxen Sodium (ALEVE) 220 MG CAPS Take 2 capsules by mouth at bedtime as needed.      . Omega-3 Fatty Acids (FISH OIL) 1000 MG CAPS Take 1 capsule by mouth 2 (two) times daily.    . ONE TOUCH ULTRA TEST test strip TEST THREE  TIMES DAILY 100 each 11  . oxybutynin (DITROPAN) 5 MG tablet 1 tab po qhs for overactive bladder 30 tablet 2  . SALINE NASAL SPRAY NA Place into the nose daily as needed.    . tamsulosin (FLOMAX) 0.4 MG CAPS capsule TAKE 2 CAPSULES BY MOUTH AT BEDTIME 180 capsule 1  . vitamin B-12 (CYANOCOBALAMIN) 1000 MCG tablet Take 1,000 mcg by mouth daily.       No current facility-administered medications on file prior to visit.     PAST MEDICAL HISTORY: Past Medical History:  Diagnosis Date  . Allergic rhinitis, unspecified    Allergy testing via allergist 04/2017--mostly neg.  Dr. Donneta Romberg recommended referral to ENT so i did this.  . Back pain   . BPH (benign prostatic hypertrophy)   . Chronic nasal congestion   . DDD (degenerative disc disease), lumbar 2010   laminectomy 01/2009  . Depression    Hospitalized for suicidal ideation 19.  Has been on lexapro since 10/2008.  . Diabetes mellitus type II    Dx'd approximately 06/2008.  No retinopathy as of 02/2017 ophth.  . Erectile dysfunction   . Erectile dysfunction   . History of colon polyps 01/2016   Recall 3 yrs  . History of pneumonia 2008   Hospitalized  . History of scarlet fever    age 25  . Hyperlipidemia, mixed 1993   myalgias on pravastatin  . Hypertension 2009   "marked chronic cardiomegaly" on CXR 01/2009 per old records.  . 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 Insomnia   . Keratoconus    dx'd 1973  . Memory changes   . OSA (obstructive sleep apnea) 2004; 2019   +sleep study 2019, titration study to be done as of 04/20/18 sleep med MD f/u.  . Osteoarthritis of both knees    Mild plain film changes in medial compartment bilat  . Restless leg   . Rhinitis, chronic   . 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 APPENDECTOMY  1972  . COLONOSCOPY  X 4   Most recent was 2007  [place], [place]), with removal of polyps in the first two.  02/21/16: tubular adenoma.  Recall 3 yrs (Dr. Ardis Hughs).  . 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 ELECTROCARDIOGRAM  01/29/2009   NORMAL  . LUMBAR LAMINECTOMY  01/2009  . TONSILLECTOMY AND ADENOIDECTOMY     age 89  . URETHRAL DILATION  2 as a child, one as an adult  . VASECTOMY  1994    SOCIAL HISTORY: Social History   Tobacco Use  . Smoking status: Former Research scientist (life sciences)  . Smokeless tobacco: Never Used  Substance Use Topics  . Alcohol use: Yes    Comment: social, 3-4 drinks month  . Drug use: No    FAMILY HISTORY: Family History  Problem Relation Age of Onset  . Heart disease Mother   . Hyperlipidemia Mother   . Stroke Mother   . Diabetes Mother   . Depression Mother   . Alzheimer's disease Mother   . Stroke Father   . Hyperlipidemia Father   . Heart disease Father   . Diabetes Father   . Hypertension Father   . Obesity Father   . Alcohol abuse Sister   . Depression Sister     ROS: Review of Systems  Constitutional: Negative for weight loss.  Endo/Heme/Allergies:       Negative for hypoglycemia  Psychiatric/Behavioral: Positive for depression. Negative for suicidal ideas.    PHYSICAL EXAM: Blood pressure 102/64, pulse 74, temperature 98 F (36.7 C), temperature source Oral, height 5\' 10"  (1.778 m), weight 257 lb (116.6 kg), SpO2 95 %. Body mass index is 36.88 kg/m. Physical Exam  Constitutional: He is oriented to person, place, and time. He appears well-developed and well-nourished.  Cardiovascular: Normal rate.  Pulmonary/Chest: Effort normal.  Musculoskeletal: Normal range of motion.  Neurological: He is oriented to person, place, and time.  Skin: Skin is warm and dry.  Psychiatric: He has a normal mood and affect. His behavior is normal.  Vitals reviewed.   RECENT LABS AND TESTS: BMET    Component Value Date/Time   NA 136 02/14/2018 0848   K 4.8 02/14/2018 0848   CL 97 02/14/2018 0848   CO2  24 02/14/2018 0848   GLUCOSE 172 (H) 02/14/2018 0848   GLUCOSE 149 (H) 02/04/2018 1007   BUN 26 02/14/2018 0848   CREATININE 1.00 02/14/2018 0848   CREATININE 0.94 01/18/2017 0832   CALCIUM 10.2 02/14/2018 0848   GFRNONAA 79 02/14/2018 0848   GFRAA 91 02/14/2018 0848   Lab Results  Component Value Date   HGBA1C 6.5 05/09/2018   HGBA1C 7.6 (H) 02/04/2018   HGBA1C 7.1 11/05/2017   HGBA1C 8.6 04/29/2017   HGBA1C 8.9 (H) 01/18/2017   No results found for: INSULIN CBC    Component Value Date/Time   WBC 7.2 02/14/2018 0848   WBC 6.2 05/21/2014 1541   RBC 4.63 02/14/2018 0848   RBC 4.45 05/21/2014 1541   HGB 13.4 02/14/2018 0848   HCT 39.3 02/14/2018 0848   PLT 211.0 05/21/2014 1541   MCV 85 02/14/2018 0848   MCH 28.9 02/14/2018 0848   MCHC 34.1 02/14/2018 0848   MCHC 33.3 05/21/2014 1541   RDW 14.1 02/14/2018 0848   LYMPHSABS 2.5 02/14/2018 0848   MONOABS 0.4 05/21/2014 1541   EOSABS 0.2 02/14/2018 0848   BASOSABS 0.0 02/14/2018 0848   Iron/TIBC/Ferritin/ %Sat No results found for: IRON, TIBC, FERRITIN, IRONPCTSAT Lipid Panel     Component Value Date/Time   CHOL 167 02/04/2018 1007   TRIG 171.0 (H) 02/04/2018 1007   HDL 50.30 02/04/2018 1007   CHOLHDL 3 02/04/2018 1007   VLDL 34.2 02/04/2018 1007   LDLCALC 82 02/04/2018 1007   LDLDIRECT 112.0 07/31/2016 1116   Hepatic Function Panel     Component Value Date/Time   PROT 6.8 02/14/2018 0848   ALBUMIN 4.4 02/14/2018 0848  AST 16 02/14/2018 0848   ALT 23 02/14/2018 0848   ALKPHOS 35 (L) 02/14/2018 0848   BILITOT 0.4 02/14/2018 0848      Component Value Date/Time   TSH 2.030 02/14/2018 0848   TSH 1.46 05/21/2014 1541   TSH 2.50 05/10/2013 0832    ASSESSMENT AND PLAN: Type 2 diabetes mellitus without complication, with long-term current use of insulin ([place]) - Plan: liraglutide (VICTOZA) 18 MG/3ML SOPN  Other depression - with emotional eating  - Plan: escitalopram (LEXAPRO) 20 MG tablet  Class 2 severe  obesity with serious comorbidity and body mass index (BMI) of 36.0 to 36.9 in adult, unspecified obesity type ([place])  PLAN:  Diabetes II [name] [name] has been given extensive diabetes education by myself today including ideal fasting and post-prandial blood glucose readings, individual ideal Hgb A1c goals and hypoglycemia prevention. We discussed the importance of good blood sugar control to decrease the likelihood of diabetic complications such as nephropathy, neuropathy, limb loss, blindness, coronary artery disease, and death. We discussed the importance of intensive lifestyle modification including diet, exercise and weight loss as the first line treatment for diabetes. [name] [name] agrees to increase Victoza to 1.2 mg qd #2 pens/box (patient to increase to 5 clicks past 0.6 and approximately 0.9). [name] [name] agrees to follow up at the agreed upon time.  Depression with Emotional Eating Behaviors We discussed behavior modification techniques today to help [name] [name] deal with his emotional eating and depression. He has agreed to continue Lexapro 20 mg qd #30 with no refills and follow up as directed.  Obesity [name] [name] is currently in the action stage of change. As such, his goal is to continue with weight loss efforts He has agreed to follow the Category 4 plan [name] [name] has been instructed to work up to a goal of 150 minutes of combined cardio and strengthening exercise per week for weight loss and overall health benefits. We discussed the following Behavioral Modification Strategies today: increasing lean protein intake, decreasing simple carbohydrates , decrease eating out and work on meal planning and easy cooking plans  [name] [name] has agreed to follow up with our clinic in 2 to 3 weeks. He was informed of the importance of frequent follow up visits to maximize his success with intensive lifestyle modifications for his multiple health conditions.   OBESITY BEHAVIORAL INTERVENTION VISIT  Today's visit was # 6 out of  22.  Starting weight: 272 lbs Starting date: 02/14/18 Today's weight : 257 lbs Today's date: 05/17/2018 Total lbs lost to date: 15 (Patients must lose 7 lbs in the first 6 months to continue with counseling)   ASK: We discussed the diagnosis of obesity with [name] [name] today and [name] [name] agreed to give [name] permission to discuss obesity behavioral modification therapy today.  ASSESS: [name] [name] has the diagnosis of obesity and his BMI today is 36.88 [name] [name] is in the action stage of change   ADVISE: [name] [name] was educated on the multiple health risks of obesity as well as the benefit of weight loss to improve his health. He was advised of the need for long term treatment and the importance of lifestyle modifications.  AGREE: Multiple dietary modification options and treatment options were discussed and  [name] [name] agreed to the above obesity treatment plan.  I, Doreene Nest, am acting as transcriptionist for Dennard Nip, MD  I have reviewed the above documentation for accuracy and completeness, and I agree with the above. -Dennard Nip, MD

## 2018-05-24 ENCOUNTER — Encounter

## 2018-05-24 ENCOUNTER — Ambulatory Visit (INDEPENDENT_AMBULATORY_CARE_PROVIDER_SITE_OTHER): Payer: Medicare Other | Admitting: Neurology

## 2018-05-24 ENCOUNTER — Encounter: Payer: Self-pay | Admitting: Neurology

## 2018-05-24 VITALS — BP 100/66 | HR 91 | Ht 70.0 in | Wt 259.4 lb

## 2018-05-24 DIAGNOSIS — G4733 Obstructive sleep apnea (adult) (pediatric): Secondary | ICD-10-CM

## 2018-05-25 ENCOUNTER — Telehealth: Payer: Self-pay | Admitting: Neurology

## 2018-05-25 NOTE — Progress Notes (Signed)
Patient left he will follow up with Dr. Rexene Alberts after he has used the cpap for appropriate time.

## 2018-05-25 NOTE — Telephone Encounter (Signed)
Patient diagnosed with osa needs follow up with dr Rexene Alberts in the future if still c/o memory loss after OSA treated thanks

## 2018-05-29 ENCOUNTER — Encounter: Payer: Self-pay | Admitting: Family Medicine

## 2018-06-07 ENCOUNTER — Other Ambulatory Visit: Payer: Self-pay | Admitting: Family Medicine

## 2018-06-09 ENCOUNTER — Ambulatory Visit (INDEPENDENT_AMBULATORY_CARE_PROVIDER_SITE_OTHER): Payer: Medicare Other | Admitting: Family Medicine

## 2018-06-09 VITALS — BP 102/66 | HR 73 | Temp 98.2°F | Ht 70.0 in | Wt 256.0 lb

## 2018-06-09 DIAGNOSIS — E119 Type 2 diabetes mellitus without complications: Secondary | ICD-10-CM

## 2018-06-09 DIAGNOSIS — F3289 Other specified depressive episodes: Secondary | ICD-10-CM

## 2018-06-09 DIAGNOSIS — Z6836 Body mass index (BMI) 36.0-36.9, adult: Secondary | ICD-10-CM

## 2018-06-09 DIAGNOSIS — Z794 Long term (current) use of insulin: Secondary | ICD-10-CM | POA: Diagnosis not present

## 2018-06-09 MED ORDER — ESCITALOPRAM OXALATE 20 MG PO TABS: ORAL_TABLET | ORAL | 0 refills | Status: DC

## 2018-06-09 MED ORDER — LIRAGLUTIDE 18 MG/3ML ~~LOC~~ SOPN: PEN_INJECTOR | SUBCUTANEOUS | 0 refills | Status: DC

## 2018-06-09 NOTE — Progress Notes (Signed)
Office: [phone]  /  Fax: [phone]   HPI:   Chief Complaint: OBESITY [name] [name] is here to discuss his progress with his obesity treatment plan. He is on the Category 4 plan and is following his eating plan approximately 70 % of the time. He states he is walking the dog for 30 minutes 3 to 4 times per week. [name] [name] continues to lose weight, but he has increased stressors at home. He notes increased comfort eating, but he doesn't want to consider adding medications at this time, as he is still losing weight, despite his challenges. His weight is 256 lb (116.1 kg) today and has had a weight loss of 1 pound over a period of 3 weeks since his last visit. He has lost 16 lbs since starting treatment with [name].  Diabetes II [name] [name] has a diagnosis of diabetes type II. He is on Victoza 0.9 mg and states fasting BGs range between 120 and 130. [name] [name] denies any hypoglycemic episodes, nausea or vomiting. Last A1c was at 6.5 He has been working on intensive lifestyle modifications including diet, exercise, and weight loss to help control his blood glucose levels.  Depression with emotional eating behaviors [name] [name] mood is stable on Lexapro. He still notes some stress eating, especially with his wife being stressed about her retirement. [name] [name] struggles with emotional eating and using food for comfort to the extent that it is negatively impacting his health. He often snacks when he is not hungry. [name] [name] sometimes feels he is out of control and then feels guilty that he made poor food choices. He has been working on behavior modification techniques to help reduce his emotional eating and has been somewhat successful. He shows no sign of suicidal or homicidal ideations.  Depression screen [facility] 2/9 02/14/2018 02/04/2018 11/05/2017 07/08/2017 05/27/2017  Decreased Interest 2 0 0 0 0  Down, Depressed, Hopeless 1 1 1  0 1  PHQ - 2 Score 3 1 1  0 1  Altered sleeping 1 3 3  - -  Tired, decreased energy 3 1 2  - -  Change  in appetite 2 0 1 - -  Feeling bad or failure about yourself  1 0 1 - -  Trouble concentrating 3 0 1 - -  Moving slowly or fidgety/restless 1 0 0 - -  Suicidal thoughts 0 0 0 - -  PHQ-9 Score 14 5 9  - -  Difficult doing work/chores Not difficult at all Not difficult at all Not difficult at all - -    ALLERGIES: Allergies  Allergen Reactions  . Simvastatin Other (See Comments)    myalgias  . Antihistamines, Diphenhydramine-Type Other (See Comments)    depression  . Codeine Nausea Only  . Penicillins Hives  . Sulfa Antibiotics Hives    MEDICATIONS: Current Outpatient Medications on File Prior to Visit  Medication Sig Dispense Refill  . albuterol (PROVENTIL HFA;VENTOLIN HFA) 108 (90 Base) MCG/ACT inhaler Inhale into the lungs every 6 (six) hours as needed for wheezing or shortness of breath.    [name] aspirin 325 MG tablet Take 325 mg by mouth daily.      [name] atorvastatin (LIPITOR) 80 MG tablet TAKE 1 TABLET BY MOUTH ONCE DAILY 90 tablet 1  . Choline Fenofibrate (FENOFIBRIC ACID) 135 MG CPDR TAKE 1 CAPSULE BY MOUTH ONCE DAILY 90 capsule 1  . finasteride (PROSCAR) 5 MG tablet TAKE 1 TABLET BY MOUTH ONCE DAILY 90 tablet 0  . fluticasone (FLONASE) 50 MCG/ACT nasal spray Place into both nostrils daily.    [name]  Insulin Detemir (LEVEMIR FLEXTOUCH) 100 UNIT/ML Pen Inject 60 Units into the skin daily at 10 pm. 52 U SQ qhs 5 pen 3  . Insulin Pen Needle (BD PEN NEEDLE NANO U/F) 32G X 4 MM MISC USE FOR INSULIN INJECTION FOUR TIMES A DAY 100 each 12  . lisinopril (PRINIVIL,ZESTRIL) 20 MG tablet TAKE 1 TABLET ONCE DAILY 90 tablet 0  . MELATONIN PO Take by mouth.    . metFORMIN (GLUCOPHAGE) 500 MG tablet TAKE 2 TABLETS BY MOUTH TWICE A DAY WITH FOOD 360 tablet 1  . Naproxen Sodium (ALEVE) 220 MG CAPS Take 2 capsules by mouth at bedtime as needed.      . Omega-3 Fatty Acids (FISH OIL) 1000 MG CAPS Take 1 capsule by mouth 2 (two) times daily.    . ONE TOUCH ULTRA TEST test strip TEST THREE TIMES DAILY 100  each 11  . oxybutynin (DITROPAN) 5 MG tablet 1 tab po qhs for overactive bladder 30 tablet 2  . SALINE NASAL SPRAY NA Place into the nose daily as needed.    . tamsulosin (FLOMAX) 0.4 MG CAPS capsule TAKE 2 CAPSULES BY MOUTH AT BEDTIME 180 capsule 1  . vitamin B-12 (CYANOCOBALAMIN) 1000 MCG tablet Take 1,000 mcg by mouth daily.       No current facility-administered medications on file prior to visit.     PAST MEDICAL HISTORY: Past Medical History:  Diagnosis Date  . Allergic rhinitis, unspecified    Allergy testing via allergist 04/2017--mostly neg.  Dr. Donneta Romberg recommended referral to ENT so i did this.  . Back pain   . BPH (benign prostatic hypertrophy)   . Chronic nasal congestion   . DDD (degenerative disc disease), lumbar 2010   laminectomy 01/2009  . Depression    Hospitalized for suicidal ideation 32.  Has been on lexapro since 10/2008.  . Diabetes mellitus type II    Dx'd approximately 06/2008.  No retinopathy as of 02/2017 ophth.  . Erectile dysfunction   . Erectile dysfunction   . History of colon polyps 01/2016   Recall 3 yrs  . History of pneumonia 2008   Hospitalized  . History of scarlet fever    age 47  . Hyperlipidemia, mixed 1993   myalgias on pravastatin  . Hypertension 2009   "marked chronic cardiomegaly" on CXR 01/2009 per old records.  . 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 Insomnia   . Keratoconus    dx'd 1973  . Memory changes   . OSA on CPAP 2004; 2019   Back on CPAP 04/2018  . Osteoarthritis of both knees    Mild plain film changes in medial compartment bilat  . Restless leg   . Rhinitis, chronic   . 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 APPENDECTOMY  1972  . COLONOSCOPY  X 4   Most recent was 2007 [place], [place]), with removal of polyps in the first two.  02/21/16: tubular adenoma.  Recall 3 yrs  (Dr. Ardis Hughs).  . 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 ELECTROCARDIOGRAM  01/29/2009   NORMAL  . LUMBAR LAMINECTOMY  01/2009  . TONSILLECTOMY AND ADENOIDECTOMY     age 32  . URETHRAL DILATION     2 as a child, one as an adult  . VASECTOMY  1994  .  VITRECTOMY      SOCIAL HISTORY: Social History   Tobacco Use  . Smoking status: Former Research scientist (life sciences)  . Smokeless tobacco: Never Used  Substance Use Topics  . Alcohol use: Yes    Comment: social, 3-4 drinks month  . Drug use: No    FAMILY HISTORY: Family History  Problem Relation Age of Onset  . Heart disease Mother   . Hyperlipidemia Mother   . Stroke Mother   . Diabetes Mother   . Depression Mother   . Alzheimer's disease Mother   . Stroke Father   . Hyperlipidemia Father   . Heart disease Father   . Diabetes Father   . Hypertension Father   . Obesity Father   . Alcohol abuse Sister   . Depression Sister     ROS: Review of Systems  Constitutional: Positive for weight loss.  Psychiatric/Behavioral: Positive for depression. Negative for suicidal ideas.       Positive for stress    PHYSICAL EXAM: Blood pressure 102/66, pulse 73, temperature 98.2 F (36.8 C), temperature source Oral, height 5\' 10"  (1.778 m), weight 256 lb (116.1 kg), SpO2 96 %. Body mass index is 36.73 kg/m. Physical Exam  Constitutional: He is oriented to person, place, and time. He appears well-developed and well-nourished.  Cardiovascular: Normal rate.  Pulmonary/Chest: Effort normal.  Musculoskeletal: Normal range of motion.  Neurological: He is oriented to person, place, and time.  Skin: Skin is warm and dry.  Psychiatric: He has a normal mood and affect. His behavior is normal.  Vitals reviewed.   RECENT LABS AND TESTS: BMET    Component Value Date/Time   NA 136 02/14/2018 0848   K 4.8 02/14/2018 0848   CL 97 02/14/2018 0848   CO2 24 02/14/2018 0848   GLUCOSE 172 (H) 02/14/2018 0848   GLUCOSE 149 (H) 02/04/2018 1007   BUN 26  02/14/2018 0848   CREATININE 1.00 02/14/2018 0848   CREATININE 0.94 01/18/2017 0832   CALCIUM 10.2 02/14/2018 0848   GFRNONAA 79 02/14/2018 0848   GFRAA 91 02/14/2018 0848   Lab Results  Component Value Date   HGBA1C 6.5 05/09/2018   HGBA1C 7.6 (H) 02/04/2018   HGBA1C 7.1 11/05/2017   HGBA1C 8.6 04/29/2017   HGBA1C 8.9 (H) 01/18/2017   No results found for: INSULIN CBC    Component Value Date/Time   WBC 7.2 02/14/2018 0848   WBC 6.2 05/21/2014 1541   RBC 4.63 02/14/2018 0848   RBC 4.45 05/21/2014 1541   HGB 13.4 02/14/2018 0848   HCT 39.3 02/14/2018 0848   PLT 211.0 05/21/2014 1541   MCV 85 02/14/2018 0848   MCH 28.9 02/14/2018 0848   MCHC 34.1 02/14/2018 0848   MCHC 33.3 05/21/2014 1541   RDW 14.1 02/14/2018 0848   LYMPHSABS 2.5 02/14/2018 0848   MONOABS 0.4 05/21/2014 1541   EOSABS 0.2 02/14/2018 0848   BASOSABS 0.0 02/14/2018 0848   Iron/TIBC/Ferritin/ %Sat No results found for: IRON, TIBC, FERRITIN, IRONPCTSAT Lipid Panel     Component Value Date/Time   CHOL 167 02/04/2018 1007   TRIG 171.0 (H) 02/04/2018 1007   HDL 50.30 02/04/2018 1007   CHOLHDL 3 02/04/2018 1007   VLDL 34.2 02/04/2018 1007   LDLCALC 82 02/04/2018 1007   LDLDIRECT 112.0 07/31/2016 1116   Hepatic Function Panel     Component Value Date/Time   PROT 6.8 02/14/2018 0848   ALBUMIN 4.4 02/14/2018 0848   AST 16 02/14/2018 0848   ALT 23 02/14/2018 0848  ALKPHOS 35 (L) 02/14/2018 0848   BILITOT 0.4 02/14/2018 0848      Component Value Date/Time   TSH 2.030 02/14/2018 0848   TSH 1.46 05/21/2014 1541   TSH 2.50 05/10/2013 0832    ASSESSMENT AND PLAN: Other depression - with emotional eating  - Plan: escitalopram (LEXAPRO) 20 MG tablet  Type 2 diabetes mellitus without complication, with long-term current use of insulin (HCC) - Plan: liraglutide (VICTOZA) 18 MG/3ML SOPN  Class 2 severe obesity with serious comorbidity and body mass index (BMI) of 36.0 to 36.9 in adult, unspecified  obesity type ([place])  PLAN:  Diabetes II [name] [name] has been given extensive diabetes education by myself today including ideal fasting and post-prandial blood glucose readings, individual ideal Hgb A1c goals and hypoglycemia prevention. We discussed the importance of good blood sugar control to decrease the likelihood of diabetic complications such as nephropathy, neuropathy, limb loss, blindness, coronary artery disease, and death. We discussed the importance of intensive lifestyle modification including diet, exercise and weight loss as the first line treatment for diabetes. Zebbie agrees to continue Victoza 1.2 mg #2 pens with no refills (patient to increase from 0.9 mg to 1.2 mg) and follow up at the agreed upon time.  Depression with Emotional Eating Behaviors We discussed behavior modification techniques today to help Elek deal with his emotional eating and depression. He has agreed to continue Lexapro 20 mg qd #30 with no refills and follow up as directed.  Obesity Austan is currently in the action stage of change. As such, his goal is to continue with weight loss efforts He has agreed to follow the Category 4 plan Rjay has been instructed to work up to a goal of 150 minutes of combined cardio and strengthening exercise per week for weight loss and overall health benefits. We discussed the following Behavioral Modification Strategies today: keeping healthy foods in the home, dealing with family or coworker sabotage and emotional eating strategies  Carry has agreed to follow up with our clinic in 3 weeks. He was informed of the importance of frequent follow up visits to maximize his success with intensive lifestyle modifications for his multiple health conditions.   OBESITY BEHAVIORAL INTERVENTION VISIT  Today's visit was # 7 out of 22.  Starting weight: 272 lbs Starting date: 02/14/18 Today's weight : 256 lbs Today's date: 06/09/2018 Total lbs lost to date: 16 (Patients must lose 7  lbs in the first 6 months to continue with counseling)   ASK: We discussed the diagnosis of obesity with Margarita Rana today and Atlas agreed to give [name] permission to discuss obesity behavioral modification therapy today.  ASSESS: Percival has the diagnosis of obesity and his BMI today is 36.73 Rapheal is in the action stage of change   ADVISE: Khalif was educated on the multiple health risks of obesity as well as the benefit of weight loss to improve his health. He was advised of the need for long term treatment and the importance of lifestyle modifications.  AGREE: Multiple dietary modification options and treatment options were discussed and  Mehmet agreed to the above obesity treatment plan.  I, Doreene Nest, am acting as transcriptionist for Dennard Nip, MD  I have reviewed the above documentation for accuracy and completeness, and I agree with the above. -Dennard Nip, MD

## 2018-06-30 ENCOUNTER — Ambulatory Visit (INDEPENDENT_AMBULATORY_CARE_PROVIDER_SITE_OTHER): Payer: Medicare Other | Admitting: Family Medicine

## 2018-06-30 VITALS — BP 94/60 | HR 67 | Temp 97.7°F | Ht 70.0 in | Wt 253.0 lb

## 2018-06-30 DIAGNOSIS — F3289 Other specified depressive episodes: Secondary | ICD-10-CM | POA: Diagnosis not present

## 2018-06-30 DIAGNOSIS — Z794 Long term (current) use of insulin: Secondary | ICD-10-CM | POA: Diagnosis not present

## 2018-06-30 DIAGNOSIS — Z6836 Body mass index (BMI) 36.0-36.9, adult: Secondary | ICD-10-CM

## 2018-06-30 DIAGNOSIS — Z9189 Other specified personal risk factors, not elsewhere classified: Secondary | ICD-10-CM | POA: Diagnosis not present

## 2018-06-30 DIAGNOSIS — E119 Type 2 diabetes mellitus without complications: Secondary | ICD-10-CM | POA: Diagnosis not present

## 2018-06-30 MED ORDER — LIRAGLUTIDE 18 MG/3ML ~~LOC~~ SOPN: 1.8000 mg | PEN_INJECTOR | Freq: Every morning | SUBCUTANEOUS | 0 refills | Status: DC

## 2018-06-30 MED ORDER — ESCITALOPRAM OXALATE 20 MG PO TABS: ORAL_TABLET | ORAL | 0 refills | Status: DC

## 2018-06-30 MED ORDER — INSULIN PEN NEEDLE 32G X 4 MM MISC: 12 refills | Status: DC

## 2018-07-04 NOTE — Progress Notes (Signed)
Office: [phone]  /  Fax: [phone]   HPI:   Chief Complaint: OBESITY [name] [name] is here to discuss his progress with his obesity treatment plan. He is on the Category 4 plan and is following his eating plan approximately 75 % of the time. He states he is walking the dog for 15-30 minutes 2-3 times per week. [name] [name] ate out more and had increased celebration eating over the last 3 weeks. He is down 3 lbs but this happens to water weight. He has not been drinking as much H20 last week.  His weight is 253 lb (114.8 kg) today and has had a weight loss of 3 pounds over a period of 3 weeks since his last visit. He has lost 19 lbs since starting treatment with [name].  Diabetes II [name] [name] has a diagnosis of diabetes type II. [name] [name] states fasting BGs range mostly between 100 and 120, well controll but still struggling with polyphagia. He denies hypoglycemia. Last A1c was 6.5. He has been working on intensive lifestyle modifications including diet, exercise, and weight loss to help control his blood glucose levels.  At risk for cardiovascular disease [name] [name] is at a higher than average risk for cardiovascular disease due to obesity and diabetes II. He currently denies any chest pain.  Depression with emotional eating behaviors [name] [name] is stable on Lexapro and he shows no sign of suicidal or homicidal ideations. He is working on [employer]. [name] [name] struggles with emotional eating and using food for comfort to the extent that it is negatively impacting his health. He often snacks when he is not hungry. [name] [name] sometimes feels he is out of control and then feels guilty that he made poor food choices. He has been working on behavior modification techniques to help reduce his emotional eating and has been somewhat successful.   Depression screen [name] [name] 2/9 02/14/2018 02/04/2018 11/05/2017 07/08/2017 05/27/2017  Decreased Interest 2 0 0 0 0  Down, Depressed, Hopeless 1 1 1  0 1  PHQ - 2 Score 3 1 1  0 1    Altered sleeping 1 3 3  - -  Tired, decreased energy 3 1 2  - -  Change in appetite 2 0 1 - -  Feeling bad or failure about yourself  1 0 1 - -  Trouble concentrating 3 0 1 - -  Moving slowly or fidgety/restless 1 0 0 - -  Suicidal thoughts 0 0 0 - -  PHQ-9 Score 14 5 9  - -  Difficult doing work/chores Not difficult at all Not difficult at all Not difficult at all - -    ALLERGIES: Allergies  Allergen Reactions  . Simvastatin Other (See Comments)    myalgias  . Antihistamines, Diphenhydramine-Type Other (See Comments)    depression  . Codeine Nausea Only  . Penicillins Hives  . Sulfa Antibiotics Hives    MEDICATIONS: Current Outpatient Medications on File Prior to Visit  Medication Sig Dispense Refill  . albuterol (PROVENTIL HFA;VENTOLIN HFA) 108 (90 Base) MCG/ACT inhaler Inhale into the lungs every 6 (six) hours as needed for wheezing or shortness of breath.    [name] aspirin 325 MG tablet Take 325 mg by mouth daily.      [name] atorvastatin (LIPITOR) 80 MG tablet TAKE 1 TABLET BY MOUTH ONCE DAILY 90 tablet 1  . Choline Fenofibrate (FENOFIBRIC ACID) 135 MG CPDR TAKE 1 CAPSULE BY MOUTH ONCE DAILY 90 capsule 1  . finasteride (PROSCAR) 5 MG tablet TAKE 1 TABLET BY MOUTH ONCE DAILY 90 tablet  0  . fluticasone (FLONASE) 50 MCG/ACT nasal spray Place into both nostrils daily.    . Insulin Detemir (LEVEMIR FLEXTOUCH) 100 UNIT/ML Pen Inject 60 Units into the skin daily at 10 pm. 52 U SQ qhs 5 pen 3  . lisinopril (PRINIVIL,ZESTRIL) 20 MG tablet TAKE 1 TABLET ONCE DAILY 90 tablet 0  . MELATONIN PO Take by mouth.    . metFORMIN (GLUCOPHAGE) 500 MG tablet TAKE 2 TABLETS BY MOUTH TWICE A DAY WITH FOOD 360 tablet 1  . Naproxen Sodium (ALEVE) 220 MG CAPS Take 2 capsules by mouth at bedtime as needed.      . Omega-3 Fatty Acids (FISH OIL) 1000 MG CAPS Take 1 capsule by mouth 2 (two) times daily.    . ONE TOUCH ULTRA TEST test strip TEST THREE TIMES DAILY 100 each 11  . oxybutynin (DITROPAN) 5 MG tablet 1  tab po qhs for overactive bladder 30 tablet 2  . SALINE NASAL SPRAY NA Place into the nose daily as needed.    . tamsulosin (FLOMAX) 0.4 MG CAPS capsule TAKE 2 CAPSULES BY MOUTH AT BEDTIME 180 capsule 1  . vitamin B-12 (CYANOCOBALAMIN) 1000 MCG tablet Take 1,000 mcg by mouth daily.       No current facility-administered medications on file prior to visit.     PAST MEDICAL HISTORY: Past Medical History:  Diagnosis Date  . Allergic rhinitis, unspecified    Allergy testing via allergist 04/2017--mostly neg.  Dr. Donneta Romberg recommended referral to ENT so i did this.  . Back pain   . BPH (benign prostatic hypertrophy)   . Chronic nasal congestion   . DDD (degenerative disc disease), lumbar 2010   laminectomy 01/2009  . Depression    Hospitalized for suicidal ideation 50.  Has been on lexapro since 10/2008.  . Diabetes mellitus type II    Dx'd approximately 06/2008.  No retinopathy as of 02/2017 ophth.  . Erectile dysfunction   . Erectile dysfunction   . History of colon polyps 01/2016   Recall 3 yrs  . History of pneumonia 2008   Hospitalized  . History of scarlet fever    age 76  . Hyperlipidemia, mixed 1993   myalgias on pravastatin  . Hypertension 2009   "marked chronic cardiomegaly" on CXR 01/2009 per old records.  . 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 Insomnia   . Keratoconus    dx'd 1973  . Memory changes   . OSA on CPAP 2004; 2019   Back on CPAP 04/2018  . Osteoarthritis of both knees    Mild plain film changes in medial compartment bilat  . Restless leg   . Rhinitis, chronic   . 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 APPENDECTOMY  1972  . COLONOSCOPY  X 4   Most recent was 2007 [place], [place]), with removal of polyps in the first two.  02/21/16: tubular adenoma.  Recall 3 yrs (Dr. Ardis Hughs).  . 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 ELECTROCARDIOGRAM  01/29/2009   NORMAL  . LUMBAR LAMINECTOMY  01/2009  . TONSILLECTOMY AND ADENOIDECTOMY     age 54  . URETHRAL DILATION     2 as a child, one as an adult  . VASECTOMY  1994  . VITRECTOMY      SOCIAL HISTORY:  Social History   Tobacco Use  . Smoking status: Former Research scientist (life sciences)  . Smokeless tobacco: Never Used  Substance Use Topics  . Alcohol use: Yes    Comment: social, 3-4 drinks month  . Drug use: No    FAMILY HISTORY: Family History  Problem Relation Age of Onset  . Heart disease Mother   . Hyperlipidemia Mother   . Stroke Mother   . Diabetes Mother   . Depression Mother   . Alzheimer's disease Mother   . Stroke Father   . Hyperlipidemia Father   . Heart disease Father   . Diabetes Father   . Hypertension Father   . Obesity Father   . Alcohol abuse Sister   . Depression Sister     ROS: Review of Systems  Constitutional: Positive for weight loss.  Cardiovascular: Negative for chest pain.  Endo/Heme/Allergies:       Positive polyphagia Negative hypoglycemia  Psychiatric/Behavioral: Positive for depression. Negative for suicidal ideas.    PHYSICAL EXAM: Blood pressure 94/60, pulse 67, temperature 97.7 F (36.5 C), temperature source Oral, height 5\' 10"  (1.778 m), weight 253 lb (114.8 kg), SpO2 96 %. Body mass index is 36.3 kg/m. Physical Exam  Constitutional: He is oriented to person, place, and time. He appears well-developed and well-nourished.  Cardiovascular: Normal rate.  Pulmonary/Chest: Effort normal.  Musculoskeletal: Normal range of motion.  Neurological: He is oriented to person, place, and time.  Skin: Skin is warm and dry.  Psychiatric: He has a normal mood and affect. His behavior is normal.  Vitals reviewed.   RECENT LABS AND TESTS: BMET    Component Value Date/Time   NA 136 02/14/2018 0848   K 4.8 02/14/2018 0848   CL 97 02/14/2018 0848   CO2 24 02/14/2018 0848   GLUCOSE 172 (H) 02/14/2018 0848   GLUCOSE  149 (H) 02/04/2018 1007   BUN 26 02/14/2018 0848   CREATININE 1.00 02/14/2018 0848   CREATININE 0.94 01/18/2017 0832   CALCIUM 10.2 02/14/2018 0848   GFRNONAA 79 02/14/2018 0848   GFRAA 91 02/14/2018 0848   Lab Results  Component Value Date   HGBA1C 6.5 05/09/2018   HGBA1C 7.6 (H) 02/04/2018   HGBA1C 7.1 11/05/2017   HGBA1C 8.6 04/29/2017   HGBA1C 8.9 (H) 01/18/2017   No results found for: INSULIN CBC    Component Value Date/Time   WBC 7.2 02/14/2018 0848   WBC 6.2 05/21/2014 1541   RBC 4.63 02/14/2018 0848   RBC 4.45 05/21/2014 1541   HGB 13.4 02/14/2018 0848   HCT 39.3 02/14/2018 0848   PLT 211.0 05/21/2014 1541   MCV 85 02/14/2018 0848   MCH 28.9 02/14/2018 0848   MCHC 34.1 02/14/2018 0848   MCHC 33.3 05/21/2014 1541   RDW 14.1 02/14/2018 0848   LYMPHSABS 2.5 02/14/2018 0848   MONOABS 0.4 05/21/2014 1541   EOSABS 0.2 02/14/2018 0848   BASOSABS 0.0 02/14/2018 0848   Iron/TIBC/Ferritin/ %Sat No results found for: IRON, TIBC, FERRITIN, IRONPCTSAT Lipid Panel     Component Value Date/Time   CHOL 167 02/04/2018 1007   TRIG 171.0 (H) 02/04/2018 1007   HDL 50.30 02/04/2018 1007   CHOLHDL 3 02/04/2018 1007   VLDL 34.2 02/04/2018 1007   LDLCALC 82 02/04/2018 1007   LDLDIRECT 112.0 07/31/2016 1116   Hepatic Function Panel     Component Value Date/Time   PROT 6.8 02/14/2018 0848   ALBUMIN 4.4 02/14/2018 0848   AST 16 02/14/2018 0848   ALT 23 02/14/2018  0848   ALKPHOS 35 (L) 02/14/2018 0848   BILITOT 0.4 02/14/2018 0848      Component Value Date/Time   TSH 2.030 02/14/2018 0848   TSH 1.46 05/21/2014 1541   TSH 2.50 05/10/2013 0832    ASSESSMENT AND PLAN: Type 2 diabetes mellitus without complication, with long-term current use of insulin ([name] [name]) - Plan: Insulin Pen Needle (BD PEN NEEDLE NANO U/F) 32G X 4 MM MISC, liraglutide (VICTOZA) 18 MG/3ML SOPN  Other depression - with emotional eating  - Plan: escitalopram (LEXAPRO) 20 MG tablet  At risk for heart  disease  Class 2 severe obesity with serious comorbidity and body mass index (BMI) of 36.0 to 36.9 in adult, unspecified obesity type ([place])  PLAN:  Diabetes II [name] [name] has been given extensive diabetes education by myself today including ideal fasting and post-prandial blood glucose readings, individual ideal Hgb A1c goals and hypoglycemia prevention. We discussed the importance of good blood sugar control to decrease the likelihood of diabetic complications such as nephropathy, neuropathy, limb loss, blindness, coronary artery disease, and death. We discussed the importance of intensive lifestyle modification including diet, exercise and weight loss as the first line treatment for diabetes. Coron agrees to increase Victoza to 1.8 mg q AM #3 pens with no refills and we will refill nano needles. [name] [name] agrees to follow up with our clinic in 3 weeks.  Cardiovascular risk counselling [name] [name] was given extended (15 minutes) coronary artery disease prevention counseling today. He is 67 y.o. male and has risk factors for heart disease including obesity and diabetes II. We discussed intensive lifestyle modifications today with an emphasis on specific weight loss instructions and strategies. Pt was also informed of the importance of increasing exercise and decreasing saturated fats to help prevent heart disease.  Depression with Emotional Eating Behaviors We discussed behavior modification techniques today to help [name] [name] deal with his emotional eating and depression. [name] [name] agrees to continue taking Lexapro 20 mg qd #30 and we will refill for 1 month. [name] [name] agrees to follow up with our clinic in 3 weeks.  Obesity [name] [name] is currently in the action stage of change. As such, his goal is to continue with weight loss efforts He has agreed to follow the Category 4 plan [name] [name] has been instructed to work up to a goal of 150 minutes of combined cardio and strengthening exercise per week for weight loss and  overall health benefits. We discussed the following Behavioral Modification Strategies today: increasing lean protein intake, decrease eating out, and increase H20 intake   [name] [name] has agreed to follow up with our clinic in 3 weeks. He was informed of the importance of frequent follow up visits to maximize his success with intensive lifestyle modifications for his multiple health conditions.   OBESITY BEHAVIORAL INTERVENTION VISIT  Today's visit was # 8 out of 22.  Starting weight: 272 lbs Starting date: 02/14/18 Today's weight : 253 lbs  Today's date: 06/30/2018 Total lbs lost to date: 64    ASK: We discussed the diagnosis of obesity with [name] [name] today and [name] [name] agreed to give [name] permission to discuss obesity behavioral modification therapy today.  ASSESS: [name] [name] has the diagnosis of obesity and his BMI today is 36.3 [name] [name] is in the action stage of change   ADVISE: [name] [name] was educated on the multiple health risks of obesity as well as the benefit of weight loss to improve his health. He was advised of the need for long term treatment and the importance of lifestyle modifications.  AGREE: Multiple dietary modification options and treatment options were discussed and  [name] [name] agreed to the above obesity treatment plan.  I, Trixie Dredge, am acting as transcriptionist for Dennard Nip, MD  I have reviewed the above documentation for accuracy and completeness, and I agree with the above. -Dennard Nip, MD

## 2018-07-13 ENCOUNTER — Other Ambulatory Visit: Payer: Self-pay | Admitting: Family Medicine

## 2018-07-13 ENCOUNTER — Ambulatory Visit (INDEPENDENT_AMBULATORY_CARE_PROVIDER_SITE_OTHER): Payer: Medicare Other | Admitting: Family Medicine

## 2018-07-21 ENCOUNTER — Ambulatory Visit (INDEPENDENT_AMBULATORY_CARE_PROVIDER_SITE_OTHER): Payer: Medicare Other | Admitting: Family Medicine

## 2018-07-21 VITALS — BP 90/53 | HR 82 | Temp 98.1°F | Ht 69.0 in | Wt 249.0 lb

## 2018-07-21 DIAGNOSIS — E119 Type 2 diabetes mellitus without complications: Secondary | ICD-10-CM | POA: Diagnosis not present

## 2018-07-21 DIAGNOSIS — Z794 Long term (current) use of insulin: Secondary | ICD-10-CM

## 2018-07-21 DIAGNOSIS — Z6836 Body mass index (BMI) 36.0-36.9, adult: Secondary | ICD-10-CM | POA: Diagnosis not present

## 2018-07-21 DIAGNOSIS — F3289 Other specified depressive episodes: Secondary | ICD-10-CM

## 2018-07-21 DIAGNOSIS — I10 Essential (primary) hypertension: Secondary | ICD-10-CM

## 2018-07-21 MED ORDER — ESCITALOPRAM OXALATE 20 MG PO TABS: ORAL_TABLET | ORAL | 0 refills | Status: DC

## 2018-07-21 MED ORDER — LIRAGLUTIDE 18 MG/3ML ~~LOC~~ SOPN: 1.8000 mg | PEN_INJECTOR | Freq: Every morning | SUBCUTANEOUS | 0 refills | Status: DC

## 2018-07-24 ENCOUNTER — Other Ambulatory Visit: Payer: Self-pay | Admitting: Family Medicine

## 2018-07-25 ENCOUNTER — Other Ambulatory Visit: Payer: Self-pay | Admitting: Family Medicine

## 2018-07-25 NOTE — Progress Notes (Signed)
Office: [phone]  /  Fax: [phone]   HPI:   Chief Complaint: OBESITY [name] [name] is here to discuss his progress with his obesity treatment plan. He is on the Category 4 plan and is following his eating plan approximately 85 % of the time. He states he is walking the dogs 15 to 20 minutes 2 times per week. [name] [name] continues to do well with weight loss. He has been eating out, but he is being mindful and is trying to make better choices. Hunger is controlled and he is exercising routinely. His weight is 249 lb (112.9 kg) today and has had a weight loss of 4 pounds over a period of 3 weeks since his last visit. He has lost 23 lbs since starting treatment with [name].  Hypertension [name] [name] is a 67 y.o. male with hypertension. [name] [name] notes some episodes of feeling lightheaded; often in the late morning and he feels more tired and he has mild dizziness. [name] [name] denies chest pain or shortness of breath on exertion. He is working weight loss to help control his blood pressure with the goal of decreasing his risk of heart attack and stroke. [name] [name] blood pressure is not currently controlled.  Diabetes II [name] [name] has a diagnosis of diabetes type II. He is stable on metformin, levemir 60 units and victoza 1.8 mg. [name] [name] states fasting BGs range between 95 and 120 and 2 hour post prandial BGs range between 110 and 178. He is doing well on his diet prescription and he denies any hypoglycemic episodes. Last A1c was at 6.5 He has been working on intensive lifestyle modifications including diet, exercise, and weight loss to help control his blood glucose levels.  Depression with emotional eating behaviors [name] [name] is stable on Lexapro and he is doing well with decreasing emotional eating. [name] [name] struggles with emotional eating and using food for comfort to the extent that it is negatively impacting his health. He often snacks when he is not hungry. [name] [name] sometimes feels he is out of control  and then feels guilty that he made poor food choices. He has been working on behavior modification techniques to help reduce his emotional eating and has been somewhat successful. He shows no sign of suicidal or homicidal ideations.  Depression screen [facility] 2/9 02/14/2018 02/04/2018 11/05/2017 07/08/2017 05/27/2017  Decreased Interest 2 0 0 0 0  Down, Depressed, Hopeless 1 1 1  0 1  PHQ - 2 Score 3 1 1  0 1  Altered sleeping 1 3 3  - -  Tired, decreased energy 3 1 2  - -  Change in appetite 2 0 1 - -  Feeling bad or failure about yourself  1 0 1 - -  Trouble concentrating 3 0 1 - -  Moving slowly or fidgety/restless 1 0 0 - -  Suicidal thoughts 0 0 0 - -  PHQ-9 Score 14 5 9  - -  Difficult doing work/chores Not difficult at all Not difficult at all Not difficult at all - -     ALLERGIES: Allergies  Allergen Reactions  . Simvastatin Other (See Comments)    myalgias  . Antihistamines, Diphenhydramine-Type Other (See Comments)    depression  . Codeine Nausea Only  . Penicillins Hives  . Sulfa Antibiotics Hives    MEDICATIONS: Current Outpatient Medications on File Prior to Visit  Medication Sig Dispense Refill  . albuterol (PROVENTIL HFA;VENTOLIN HFA) 108 (90 Base) MCG/ACT inhaler Inhale into the lungs every 6 (six) hours as needed for wheezing or shortness of breath.    [name]  aspirin 325 MG tablet Take 325 mg by mouth daily.      [name] atorvastatin (LIPITOR) 80 MG tablet TAKE 1 TABLET BY MOUTH ONCE DAILY 90 tablet 1  . Choline Fenofibrate (FENOFIBRIC ACID) 135 MG CPDR TAKE 1 CAPSULE BY MOUTH ONCE DAILY 90 capsule 1  . finasteride (PROSCAR) 5 MG tablet TAKE 1 TABLET BY MOUTH ONCE DAILY 90 tablet 1  . fluticasone (FLONASE) 50 MCG/ACT nasal spray Place into both nostrils daily.    . Insulin Detemir (LEVEMIR FLEXTOUCH) 100 UNIT/ML Pen Inject 60 Units into the skin daily at 10 pm. 52 U SQ qhs 5 pen 3  . Insulin Pen Needle (BD PEN NEEDLE NANO U/F) 32G X 4 MM MISC USE FOR INSULIN INJECTION FOUR TIMES A DAY  100 each 12  . lisinopril (PRINIVIL,ZESTRIL) 20 MG tablet TAKE 1 TABLET ONCE DAILY (Patient taking differently: Take 1/2 tab daily) 90 tablet 0  . MELATONIN PO Take by mouth.    . metFORMIN (GLUCOPHAGE) 500 MG tablet TAKE 2 TABLETS BY MOUTH TWICE A DAY WITH FOOD 360 tablet 1  . Naproxen Sodium (ALEVE) 220 MG CAPS Take 2 capsules by mouth at bedtime as needed.      . Omega-3 Fatty Acids (FISH OIL) 1000 MG CAPS Take 1 capsule by mouth 2 (two) times daily.    . ONE TOUCH ULTRA TEST test strip TEST THREE TIMES DAILY 100 each 11  . oxybutynin (DITROPAN) 5 MG tablet 1 tab po qhs for overactive bladder 30 tablet 2  . SALINE NASAL SPRAY NA Place into the nose daily as needed.    . tamsulosin (FLOMAX) 0.4 MG CAPS capsule TAKE 2 CAPSULES BY MOUTH AT BEDTIME 180 capsule 1  . vitamin B-12 (CYANOCOBALAMIN) 1000 MCG tablet Take 1,000 mcg by mouth daily.       No current facility-administered medications on file prior to visit.     PAST MEDICAL HISTORY: Past Medical History:  Diagnosis Date  . Allergic rhinitis, unspecified    Allergy testing via allergist 04/2017--mostly neg.  Dr. Donneta Romberg recommended referral to ENT so i did this.  . Back pain   . BPH (benign prostatic hypertrophy)   . Chronic nasal congestion   . DDD (degenerative disc disease), lumbar 2010   laminectomy 01/2009  . Depression    Hospitalized for suicidal ideation 4.  Has been on lexapro since 10/2008.  . Diabetes mellitus type II    Dx'd approximately 06/2008.  No retinopathy as of 02/2017 ophth.  . Erectile dysfunction   . Erectile dysfunction   . History of colon polyps 01/2016   Recall 3 yrs  . History of pneumonia 2008   Hospitalized  . History of scarlet fever    age 84  . Hyperlipidemia, mixed 1993   myalgias on pravastatin  . Hypertension 2009   "marked chronic cardiomegaly" on CXR 01/2009 per old records.  . 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 Insomnia   . Keratoconus    dx'd 1973  .  Memory changes   . OSA on CPAP 2004; 2019   Back on CPAP 04/2018  . Osteoarthritis of both knees    Mild plain film changes in medial compartment bilat  . Restless leg   . Rhinitis, chronic   . 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 APPENDECTOMY  [place]  X 4   Most recent was 2007 [place], [place]), with removal of polyps in the first two.  02/21/16: tubular adenoma.  Recall 3 yrs (Dr. Ardis Hughs).  . 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 ELECTROCARDIOGRAM  01/29/2009   NORMAL  . LUMBAR LAMINECTOMY  01/2009  . TONSILLECTOMY AND ADENOIDECTOMY     age 75  . URETHRAL DILATION     2 as a child, one as an adult  . VASECTOMY  1994  . VITRECTOMY      SOCIAL HISTORY: Social History   Tobacco Use  . Smoking status: Former Research scientist (life sciences)  . Smokeless tobacco: Never Used  Substance Use Topics  . Alcohol use: Yes    Comment: social, 3-4 drinks month  . Drug use: No    FAMILY HISTORY: Family History  Problem Relation Age of Onset  . Heart disease Mother   . Hyperlipidemia Mother   . Stroke Mother   . Diabetes Mother   . Depression Mother   . Alzheimer's disease Mother   . Stroke Father   . Hyperlipidemia Father   . Heart disease Father   . Diabetes Father   . Hypertension Father   . Obesity Father   . Alcohol abuse Sister   . Depression Sister     ROS: Review of Systems  Constitutional: Positive for malaise/fatigue and weight loss.  Respiratory: Negative for shortness of breath (on exertion).   Cardiovascular: Negative for chest pain.  Neurological: Positive for dizziness.       Positive for lightheadedness  Endo/Heme/Allergies:       Negative for hypoglycemia  Psychiatric/Behavioral: Positive for depression. Negative for suicidal ideas.    PHYSICAL EXAM: Blood pressure (!) 90/53, pulse 82, temperature 98.1 F (36.7 C), temperature source  Oral, height 5\' 9"  (1.753 m), weight 249 lb (112.9 kg), SpO2 95 %. Body mass index is 36.77 kg/m. Physical Exam  Constitutional: He is oriented to person, place, and time. He appears well-developed and well-nourished.  Cardiovascular: Normal rate.  Pulmonary/Chest: Effort normal.  Musculoskeletal: Normal range of motion.  Neurological: He is oriented to person, place, and time.  Skin: Skin is warm and dry.  Psychiatric: He has a normal mood and affect. His behavior is normal.  Vitals reviewed.   RECENT LABS AND TESTS: BMET    Component Value Date/Time   NA 136 02/14/2018 0848   K 4.8 02/14/2018 0848   CL 97 02/14/2018 0848   CO2 24 02/14/2018 0848   GLUCOSE 172 (H) 02/14/2018 0848   GLUCOSE 149 (H) 02/04/2018 1007   BUN 26 02/14/2018 0848   CREATININE 1.00 02/14/2018 0848   CREATININE 0.94 01/18/2017 0832   CALCIUM 10.2 02/14/2018 0848   GFRNONAA 79 02/14/2018 0848   GFRAA 91 02/14/2018 0848   Lab Results  Component Value Date   HGBA1C 6.5 05/09/2018   HGBA1C 7.6 (H) 02/04/2018   HGBA1C 7.1 11/05/2017   HGBA1C 8.6 04/29/2017   HGBA1C 8.9 (H) 01/18/2017   No results found for: INSULIN CBC    Component Value Date/Time   WBC 7.2 02/14/2018 0848   WBC 6.2 05/21/2014 1541   RBC 4.63 02/14/2018 0848   RBC 4.45 05/21/2014 1541   HGB 13.4 02/14/2018 0848   HCT 39.3 02/14/2018 0848   PLT 211.0 05/21/2014 1541   MCV 85 02/14/2018 0848   MCH 28.9 02/14/2018 0848   MCHC 34.1 02/14/2018 0848   MCHC 33.3 05/21/2014 1541   RDW 14.1  02/14/2018 0848   LYMPHSABS 2.5 02/14/2018 0848   MONOABS 0.4 05/21/2014 1541   EOSABS 0.2 02/14/2018 0848   BASOSABS 0.0 02/14/2018 0848   Iron/TIBC/Ferritin/ %Sat No results found for: IRON, TIBC, FERRITIN, IRONPCTSAT Lipid Panel     Component Value Date/Time   CHOL 167 02/04/2018 1007   TRIG 171.0 (H) 02/04/2018 1007   HDL 50.30 02/04/2018 1007   CHOLHDL 3 02/04/2018 1007   VLDL 34.2 02/04/2018 1007   LDLCALC 82 02/04/2018 1007    LDLDIRECT 112.0 07/31/2016 1116   Hepatic Function Panel     Component Value Date/Time   PROT 6.8 02/14/2018 0848   ALBUMIN 4.4 02/14/2018 0848   AST 16 02/14/2018 0848   ALT 23 02/14/2018 0848   ALKPHOS 35 (L) 02/14/2018 0848   BILITOT 0.4 02/14/2018 0848      Component Value Date/Time   TSH 2.030 02/14/2018 0848   TSH 1.46 05/21/2014 1541   TSH 2.50 05/10/2013 0832   Vitamin D There are no recent lab results  ASSESSMENT AND PLAN: Type 2 diabetes mellitus without complication, with long-term current use of insulin (HCC) - Plan: liraglutide (VICTOZA) 18 MG/3ML SOPN  Essential hypertension  Other depression - with emotional eating  - Plan: escitalopram (LEXAPRO) 20 MG tablet  Class 2 severe obesity with serious comorbidity and body mass index (BMI) of 36.0 to 36.9 in adult, unspecified obesity type ([place])  PLAN:  Hypertension We discussed sodium restriction, working on healthy weight loss, and a regular exercise program as the means to achieve improved blood pressure control. [name] [name] agreed with this plan and agreed to follow up as directed. We will continue to monitor his blood pressure as well as his progress with the above lifestyle modifications. He agrees to decrease lisinopril to half tablet daily (10 mg), no refill needed at this time. [name] [name] will watch for signs of hypotension as he continues his lifestyle modifications.  Diabetes II [name] [name] has been given extensive diabetes education by myself today including ideal fasting and post-prandial blood glucose readings, individual ideal Hgb A1c goals  and hypoglycemia prevention. We discussed the importance of good blood sugar control to decrease the likelihood of diabetic complications such as nephropathy, neuropathy, limb loss, blindness, coronary artery disease, and death. We discussed the importance of intensive lifestyle modification including diet, exercise and weight loss as the first line treatment for diabetes. [name] [name]  agrees to continue victoza 1.8 mg #3 pen with no refills and will follow up at the agreed upon time.  Depression with Emotional Eating Behaviors We discussed behavior modification techniques today to help [name] [name] deal with his emotional eating and depression. He has agreed to continue lexapro 20 mg qd #30 with no refills and follow up as directed.  Obesity [name] [name] is currently in the action stage of change. As such, his goal is to continue with weight loss efforts He has agreed to follow the Category 4 plan [name] [name] has been instructed to work up to a goal of 150 minutes of combined cardio and strengthening exercise per week for weight loss and overall health benefits. We discussed the following Behavioral Modification Strategies today: increasing lean protein intake and work on meal planning and easy cooking plans  [name] [name] has agreed to follow up with our clinic in 3 weeks. He was informed of the importance of frequent follow up visits to maximize his success with intensive lifestyle modifications for his multiple health conditions.   OBESITY BEHAVIORAL INTERVENTION VISIT  Today's visit was # 9  Starting weight: 272 lbs Starting date: 02/14/18 Today's weight : 249 lbs  Today's date: 07/21/2018 Total lbs lost to date: 23 At least 15 minutes were spent on discussing the following behavioral intervention visit.   ASK: We discussed the diagnosis of obesity with [name] [name] today and [name] [name] agreed to give [name] permission to discuss obesity behavioral modification therapy today.  ASSESS: [name] [name] has the diagnosis of obesity and his BMI today is 36.75 [name] [name] is in the action stage of change   ADVISE: [name] [name] was educated on the multiple health risks of obesity as well as the benefit of weight loss to improve his health. He was advised of the need for long term treatment and the importance of lifestyle modifications to improve his current health and to decrease his risk of future health  problems.  AGREE: Multiple dietary modification options and treatment options were discussed and  [name] [name] agreed to follow the recommendations documented in the above note.  ARRANGE: [name] [name] was educated on the importance of frequent visits to treat obesity as outlined per CMS and USPSTF guidelines and agreed to schedule his next follow up appointment today.  I, Doreene Nest, am acting as transcriptionist for Dennard Nip, MD  I have reviewed the above documentation for accuracy and completeness, and I agree with the above. -Dennard Nip, MD

## 2018-08-05 ENCOUNTER — Other Ambulatory Visit: Payer: Self-pay | Admitting: Family Medicine

## 2018-08-06 ENCOUNTER — Other Ambulatory Visit: Payer: Self-pay | Admitting: Family Medicine

## 2018-08-09 ENCOUNTER — Encounter: Payer: Self-pay | Admitting: Neurology

## 2018-08-10 ENCOUNTER — Encounter: Payer: Self-pay | Admitting: Family Medicine

## 2018-08-10 ENCOUNTER — Ambulatory Visit (INDEPENDENT_AMBULATORY_CARE_PROVIDER_SITE_OTHER): Payer: Medicare Other | Admitting: Family Medicine

## 2018-08-10 VITALS — BP 96/59 | HR 74 | Temp 98.2°F | Resp 16 | Ht 69.0 in | Wt 253.2 lb

## 2018-08-10 DIAGNOSIS — I952 Hypotension due to drugs: Secondary | ICD-10-CM

## 2018-08-10 DIAGNOSIS — E7800 Pure hypercholesterolemia, unspecified: Secondary | ICD-10-CM

## 2018-08-10 DIAGNOSIS — E119 Type 2 diabetes mellitus without complications: Secondary | ICD-10-CM

## 2018-08-10 DIAGNOSIS — E669 Obesity, unspecified: Secondary | ICD-10-CM

## 2018-08-10 DIAGNOSIS — Z23 Encounter for immunization: Secondary | ICD-10-CM

## 2018-08-10 DIAGNOSIS — I10 Essential (primary) hypertension: Secondary | ICD-10-CM | POA: Diagnosis not present

## 2018-08-10 DIAGNOSIS — L989 Disorder of the skin and subcutaneous tissue, unspecified: Secondary | ICD-10-CM | POA: Diagnosis not present

## 2018-08-10 LAB — LIPID PANEL
Cholesterol: 117 mg/dL (ref 0–200)
HDL: 44.6 mg/dL (ref >39.00)
LDL Cholesterol: 55 mg/dL (ref 0–99)
NonHDL: 72.86
Total CHOL/HDL Ratio: 3
Triglycerides: 91 mg/dL (ref 0.0–149.0)
VLDL: 18.2 mg/dL (ref 0.0–40.0)

## 2018-08-10 LAB — BASIC METABOLIC PANEL
BUN: 25 mg/dL — ABNORMAL HIGH (ref 6–23)
CO2: 26 mEq/L (ref 19–32)
Calcium: 9.8 mg/dL (ref 8.4–10.5)
Chloride: 103 mEq/L (ref 96–112)
Creatinine, Ser: 0.97 mg/dL (ref 0.40–1.50)
GFR: 82.39 mL/min (ref >60.00)
Glucose, Bld: 113 mg/dL — ABNORMAL HIGH (ref 70–99)
Potassium: 4.2 mEq/L (ref 3.5–5.1)
Sodium: 137 mEq/L (ref 135–145)

## 2018-08-10 LAB — HEMOGLOBIN A1C: Hgb A1c MFr Bld: 6.3 % (ref 4.6–6.5)

## 2018-08-10 MED ORDER — LISINOPRIL 5 MG PO TABS: 5.0000 mg | ORAL_TABLET | Freq: Every day | ORAL | 3 refills | Status: DC

## 2018-08-10 NOTE — Progress Notes (Signed)
OFFICE VISIT  08/10/2018   CC:  Chief Complaint  Patient presents with  . Follow-up    RCI, pt is fasting.      HPI:    Patient is a 67 y.o. Caucasian male who presents for 3 mo f/u DM 2, HTN, and nocturia. Overall feeling pretty well.  He has started CPAP since I saw him last.  Can wear it 5 hours avg per night, gets some nasal dryness/congestion. Has f/u this week with neur/sleep. Still with some afternoon fatigue.  Nocturia: He had been having nocturia despite being on flomax and finasteride last visit, so I added low dose oxybutynin to see if he had a component of OAB.  He did not notice any appreciable improvement in nocturia with this med.  DM 2: has been working hard on a TLC/wt loss program via [place].  60 U levemir qhs, victoza 1.8mg  qAM, metformin 1000 mg bid.  Dr. Shary Decamp cut out his mealtime insulin.  Glucoses 100-115 about 50% of the time and 115-140s 2H PP majority of time. Dietary indiscretion causes some highs.  He feels like he is in control of DM better.  HTN: BP low at wt loss clinic, so he cut back on lisinopril from 20mg  to 10mg  3 weeks ago and has not checked bp at home.  Less dizziness since dec dose.   ROS: still with some daily fatigue and intermittent light headedness.  Top of R hand with flat oval discoloration that is a bit flaky, present for 1/2 year or so, seems to fluctuate in size.  Past Medical History:  Diagnosis Date  . Allergic rhinitis, unspecified    Allergy testing via allergist 04/2017--mostly neg.  Dr. Donneta Romberg recommended referral to ENT so i did this.  . Back pain   . BPH (benign prostatic hypertrophy)   . Chronic nasal congestion   . DDD (degenerative disc disease), lumbar 2010   laminectomy 01/2009  . Depression    Hospitalized for suicidal ideation 56.  Has been on lexapro since 10/2008.  . Diabetes mellitus type II    Dx'd approximately 06/2008.  No retinopathy as of 02/2017 ophth.  . Erectile dysfunction   . Erectile  dysfunction   . History of colon polyps 01/2016   Recall 3 yrs  . History of pneumonia 2008   Hospitalized  . History of scarlet fever    age 3  . Hyperlipidemia, mixed 1993   myalgias on pravastatin  . Hypertension 2009   "marked chronic cardiomegaly" on CXR 01/2009 per old records.  . 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 Insomnia   . Keratoconus    dx'd 1973  . Memory changes   . OSA on CPAP 2004; 2019   Back on CPAP 04/2018  . Osteoarthritis of both knees    Mild plain film changes in medial compartment bilat  . Restless leg   . Rhinitis, chronic   . 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 APPENDECTOMY  1972  . COLONOSCOPY  X 4   Most recent was 2007 [place], [place]), with removal of polyps in the first two.  02/21/16: tubular adenoma.  Recall 3 yrs (Dr. Ardis Hughs).  . 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 ELECTROCARDIOGRAM  01/29/2009   NORMAL  . LUMBAR LAMINECTOMY  01/2009  . TONSILLECTOMY AND ADENOIDECTOMY  age 78  . URETHRAL DILATION     2 as a child, one as an adult  . VASECTOMY  1994  . VITRECTOMY      Outpatient Medications Prior to Visit  Medication Sig Dispense Refill  . albuterol (PROVENTIL HFA;VENTOLIN HFA) 108 (90 Base) MCG/ACT inhaler Inhale into the lungs every 6 (six) hours as needed for wheezing or shortness of breath.    [name] aspirin 325 MG tablet Take 325 mg by mouth daily.      [name] atorvastatin (LIPITOR) 80 MG tablet TAKE 1 TABLET BY MOUTH ONCE DAILY 90 tablet 1  . Choline Fenofibrate (FENOFIBRIC ACID) 135 MG CPDR TAKE 1 CAPSULE BY MOUTH ONCE DAILY 90 capsule 0  . escitalopram (LEXAPRO) 20 MG tablet TAKE 1 TABLET(20 MG) BY MOUTH DAILY 30 tablet 0  . finasteride (PROSCAR) 5 MG tablet TAKE 1 TABLET BY MOUTH ONCE DAILY 90 tablet 1  . fluticasone (FLONASE) 50 MCG/ACT nasal spray Place into both nostrils daily.    . Insulin Detemir  (LEVEMIR FLEXTOUCH) 100 UNIT/ML Pen Inject 60 Units into the skin daily at 10 pm. 52 U SQ qhs (Patient taking differently: Inject 60 Units into the skin daily at 10 pm. ) 5 pen 3  . Insulin Pen Needle (BD PEN NEEDLE NANO U/F) 32G X 4 MM MISC USE FOR INSULIN INJECTION FOUR TIMES A DAY 100 each 12  . liraglutide (VICTOZA) 18 MG/3ML SOPN Inject 0.3 mLs (1.8 mg total) into the skin every morning. 3 pen 0  . lisinopril (PRINIVIL,ZESTRIL) 20 MG tablet TAKE 1 TABLET ONCE DAILY (Patient taking differently: Take 1/2 tab daily) 90 tablet 0  . MELATONIN PO Take by mouth.    . metFORMIN (GLUCOPHAGE) 500 MG tablet TAKE 2 TABLETS BY MOUTH TWICE A DAY WITH FOOD 360 tablet 1  . Naproxen Sodium (ALEVE) 220 MG CAPS Take 2 capsules by mouth at bedtime as needed.      . Omega-3 Fatty Acids (FISH OIL) 1000 MG CAPS Take 1 capsule by mouth 2 (two) times daily.    . ONE TOUCH ULTRA TEST test strip TEST THREE TIMES DAILY 100 each 11  . oxybutynin (DITROPAN) 5 MG tablet TAKE 1 TABLET BY MOUTH AT BEDTIME FOR OVER ACTIVE BLADDER 30 tablet 5  . SALINE NASAL SPRAY NA Place into the nose daily as needed.    . tamsulosin (FLOMAX) 0.4 MG CAPS capsule TAKE 2 CAPSULES BY MOUTH AT BEDTIME 180 capsule 1  . vitamin B-12 (CYANOCOBALAMIN) 1000 MCG tablet Take 1,000 mcg by mouth daily.       No facility-administered medications prior to visit.     Allergies  Allergen Reactions  . Simvastatin Other (See Comments)    myalgias  . Antihistamines, Diphenhydramine-Type Other (See Comments)    depression  . Codeine Nausea Only  . Penicillins Hives  . Sulfa Antibiotics Hives    ROS As per HPI  PE: Blood pressure (!) 96/59, pulse 74, temperature 98.2 F (36.8 C), temperature source Oral, resp. rate 16, height 5\' 9"  (1.753 m), weight 253 lb 4 oz (114.9 kg), SpO2 96 %. Gen: Alert, well appearing.  Patient is oriented to person, place, time, and situation. AFFECT: pleasant, lucid thought and speech. CV: RRR, no m/r/g.   LUNGS: CTA  bilat, nonlabored resps, good aeration in all lung fields. EXT: no clubbing or cyanosis.  no edema.  SkIN: Dorsum of R hand with flat, flesh colored plaque about 1.5-2 cm diameter, slight irregular shaped.   No erythema or  tenderness.  No verrucous features noted.   LABS:  Lab Results  Component Value Date   TSH 2.030 02/14/2018   Lab Results  Component Value Date   WBC 7.2 02/14/2018   HGB 13.4 02/14/2018   HCT 39.3 02/14/2018   MCV 85 02/14/2018   PLT 211.0 05/21/2014   Lab Results  Component Value Date   CREATININE 1.00 02/14/2018   BUN 26 02/14/2018   NA 136 02/14/2018   K 4.8 02/14/2018   CL 97 02/14/2018   CO2 24 02/14/2018   Lab Results  Component Value Date   ALT 23 02/14/2018   AST 16 02/14/2018   ALKPHOS 35 (L) 02/14/2018   BILITOT 0.4 02/14/2018   Lab Results  Component Value Date   CHOL 167 02/04/2018   Lab Results  Component Value Date   HDL 50.30 02/04/2018   Lab Results  Component Value Date   LDLCALC 82 02/04/2018   Lab Results  Component Value Date   TRIG 171.0 (H) 02/04/2018   Lab Results  Component Value Date   CHOLHDL 3 02/04/2018   Lab Results  Component Value Date   PSA 0.04 (L) 05/09/2018   PSA 0.03 (L) 07/04/2015   PSA 0.02 (L) 05/21/2014   Lab Results  Component Value Date   HGBA1C 6.5 05/09/2018    IMPRESSION AND PLAN:  1) DM 2: control improving, doing much better with TLC, followed by Dr. Shary Decamp with bariatric clinic. Hba1c today.  2) HTN: bp's on the low side, with some intermittent orthostatic dizziness.  Improved with cutting back on lisinopril recently, but I'll decrease his dose again to 5mg  qd today.  3) Nocturia: no change with trial of oxybutinyn.  I think this is all from BPH.  Continue finasteride and flomax.  4) Skin lesion R hand: return for shave excision and we'll send it to derm path.  An After Visit Summary was printed and given to the patient.  FOLLOW UP: 3 mo RCI  Signed:  Crissie Sickles, MD            08/10/2018

## 2018-08-11 ENCOUNTER — Encounter (INDEPENDENT_AMBULATORY_CARE_PROVIDER_SITE_OTHER): Payer: Self-pay | Admitting: Bariatrics

## 2018-08-11 ENCOUNTER — Ambulatory Visit (INDEPENDENT_AMBULATORY_CARE_PROVIDER_SITE_OTHER): Payer: Medicare Other | Admitting: Neurology

## 2018-08-11 ENCOUNTER — Encounter: Payer: Self-pay | Admitting: Neurology

## 2018-08-11 ENCOUNTER — Ambulatory Visit (INDEPENDENT_AMBULATORY_CARE_PROVIDER_SITE_OTHER): Payer: Medicare Other | Admitting: Bariatrics

## 2018-08-11 VITALS — BP 108/67 | HR 72 | Temp 97.5°F | Ht 69.0 in | Wt 248.0 lb

## 2018-08-11 VITALS — BP 119/70 | HR 89 | Ht 70.0 in | Wt 257.0 lb

## 2018-08-11 DIAGNOSIS — E119 Type 2 diabetes mellitus without complications: Secondary | ICD-10-CM

## 2018-08-11 DIAGNOSIS — Z6836 Body mass index (BMI) 36.0-36.9, adult: Secondary | ICD-10-CM | POA: Diagnosis not present

## 2018-08-11 DIAGNOSIS — G2581 Restless legs syndrome: Secondary | ICD-10-CM | POA: Diagnosis not present

## 2018-08-11 DIAGNOSIS — G4733 Obstructive sleep apnea (adult) (pediatric): Secondary | ICD-10-CM

## 2018-08-11 DIAGNOSIS — G4761 Periodic limb movement disorder: Secondary | ICD-10-CM | POA: Diagnosis not present

## 2018-08-11 DIAGNOSIS — Z9989 Dependence on other enabling machines and devices: Secondary | ICD-10-CM

## 2018-08-11 DIAGNOSIS — Z794 Long term (current) use of insulin: Secondary | ICD-10-CM

## 2018-08-11 DIAGNOSIS — I10 Essential (primary) hypertension: Secondary | ICD-10-CM | POA: Diagnosis not present

## 2018-08-11 DIAGNOSIS — Z8639 Personal history of other endocrine, nutritional and metabolic disease: Secondary | ICD-10-CM

## 2018-08-11 NOTE — Progress Notes (Signed)
Order for pressure change sent to [facility].

## 2018-08-11 NOTE — Patient Instructions (Addendum)
Please continue using your CPAP regularly. While your insurance requires that you use CPAP at least 4 hours each night on 70% of the nights, I recommend, that you not skip any nights and use it throughout the night if you can. Getting used to CPAP and staying with the treatment long term does take time and patience and discipline. Untreated obstructive sleep apnea when it is moderate to severe can have an adverse impact on cardiovascular health and raise her risk for heart disease, arrhythmias, hypertension, congestive heart failure, stroke and diabetes. Untreated obstructive sleep apnea causes sleep disruption, nonrestorative sleep, and sleep deprivation. This can have an impact on your day to day functioning and cause daytime sleepiness and impairment of cognitive function, memory loss, mood disturbance, and problems focussing. Using CPAP regularly can improve these symptoms.  Keep up the good work! I will see you back in 6 months for sleep apnea check up, and if you continue to do well on CPAP I will see you once a year thereafter.   You should make an appointment with [facility] for troubleshooting with your machine and the difference in the pressure.   Please look into using a salt water nasal rinse system, such as the [employer] and follow the directions and my instructions. It may help your nasal congestion and help you tolerate your CPAP.   You move your legs and feet in your sleep, please ask your wife to comment on the leg movements at night.   You may be able to sleep with only one pillow.   I will reduce to pressure to 16 cm, please remind [name] to in about 6 weeks to pull another "download".

## 2018-08-11 NOTE — Progress Notes (Signed)
Subjective:    Patient ID: [name] [name] is a 67 y.o. male.  HPI     Interim history:   [name] [name] is a 67 year old right-handed gentleman with an underlying medical history of hypertension, cardiomegaly, hyperlipidemia, depression, degenerative disc disease, BPH, allergic rhinitis, type 2 diabetes, and obesity, who presents for follow-up consultation of his obstructive sleep apnea after her recent reevaluation with sleep study testing and starting CPAP therapy with a new equipment. The patient is unaccompanied today. I first met him on 03/29/2018 at the request of his primary care physician, at which time the patient reported a prior diagnosis of obstructive sleep apnea and was complaining of difficulty with concentration his memory as well as daytime somnolence. He was advised to proceed with sleep study testing. He was willing to reconsider CPAP therapy but had previously had difficulty with tolerance including nasal congestion and claustrophobia. He had a baseline sleep study, followed by a CPAP titration study. I went over his test results with him in detail today. Baseline sleep study from 04/20/2018 showed a sleep efficiency of 80.1%, sleep latency of 29 minutes, REM latency delayed at 171 minutes. He had an increased percentage of stage II sleep, slow-wave sleep was 3.3%, REM sleep mildly reduced at 14.4%, total AHI was 14.4 per hour, REM AHI 17.1 per hour, supine AHI was high but supine sleep was minimal. Average oxygen saturation was suboptimal, there were also problems with CO2 sensor, O2 nadir was 79%, he had severe PLMS with moderate arousals. He was advised to proceed with a CPAP titration study, which he had on 05/11/2018, sleep efficiency was 75.1%, sleep latency 31 minutes, REM sleep was absent. He was titrated from CPAP of 5 cm to 17 cm and requested a full facemask. On the final pressure his AHI was 0 per hour with supine non-REM sleep achieved and O2 nadir of 93%. He had  severe PLMS with moderate arousals. Based on his test results I prescribed CPAP therapy for home use at a pressure of 17 cm.   Today, 08/11/2018: I reviewed his CPAP compliance data from 07/11/2018 through 08/09/2018 which is a total of 30 days, during which time he used his CPAP every night with percent used days greater than 4 hours at 100%, indicating superb compliance with an average usage of 5 hours and 53 minutes, residual AHI at goal at 1.5 per hour, leak acceptable on the low side with the 95th percentile at 1 L/m on a pressure of 17 cm with EPR of 2. He reports overall doing quite well, he is pleased to be able to use CPAP and not suffer from issues with mask fit and claustrophobia. He is using a fullface mask successfully. He sleeps with 2 pillows and feels like he benefits from having the head of bed elevated a little bit. He does have mouth dryness, he has been using nasal saline spray before bedtime. He has some difficulty falling asleep and staying asleep. Sometimes the pressure does not feel as high as it should be. He has experimented with a ramp time and the humidity settings. He feels that his insomnia is a little bit better and he wakes up better rested. He still has bathroom breaks at night, about the same as before. He has a history of restless leg symptoms and does twitches legs while asleep but feels that if anything that has become better. He says that his wife wanted him to talk about better sleep habits.  The patient's allergies, current medications, family  history, past medical history, past social history, past surgical history and problem list were reviewed and updated as appropriate.   Previously:   03/29/2018: (He) was previously diagnosed with obstructive sleep apnea. Prior sleep study results are not available for my review today. He reports snoring and excessive daytime somnolence, difficulty with concentration and memory. I reviewed your office note from 02/04/2018. He has  been followed by medical weight management for his obesity. He estimates that his sleep study was about 15 or 20 years ago even, he was given a CPAP machine, he tried it but could not tolerate it due to claustrophobia had issues with nasal congestion. Of note, he had nasal surgery in the recent past, inferior turbinate reduction. He suspects that he has allergies but was tested recently for allergies and did not have any significant allergens. He would be willing to try CPAP again and be tested for sleep apnea. He had some memory issues. His wife successfully uses a CPAP machine and has benefited from it which encouraged him. He snores, he has breathing pauses while asleep less so since he has started avoiding sleep on his back. He had back surgery as well.  His Epworth sleepiness score is 12 out of 24 today, fatigue score is 55 out of 63. He is married and lives with his wife. He does not smoke, drinks alcohol rarely, drinks caffeine in the form of tea, 2 cups per day on average. He has had a long-standing history of restless leg syndrome, I does not very bothersome and certainly fluctuates in degree. Sometimes it affects only one leg, sometimes both, he does move in his sleep per wife's report and he has vivid dreams. He has had occasional calling out of dreams.  His Past Medical History Is Significant For: Past Medical History:  Diagnosis Date  . Allergic rhinitis, unspecified    Allergy testing via allergist 04/2017--mostly neg.  Dr. Donneta Romberg recommended referral to ENT so i did this.  . Back pain   . BPH (benign prostatic hypertrophy)   . Chronic nasal congestion   . DDD (degenerative disc disease), lumbar 2010   laminectomy 01/2009  . Depression    Hospitalized for suicidal ideation 58.  Has been on lexapro since 10/2008.  . Diabetes mellitus type II    Dx'd approximately 06/2008.  No retinopathy as of 02/2017 ophth.  . Erectile dysfunction   . Erectile dysfunction   . History of colon polyps  01/2016   Recall 3 yrs  . History of pneumonia 2008   Hospitalized  . History of scarlet fever    age 35  . Hyperlipidemia, mixed 1993   myalgias on pravastatin  . Hypertension 2009   "marked chronic cardiomegaly" on CXR 01/2009 per old records.  . 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 Insomnia   . Keratoconus    dx'd 1973  . Memory changes   . OSA on CPAP 2004; 2019   Back on CPAP 04/2018  . Osteoarthritis of both knees    Mild plain film changes in medial compartment bilat  . Restless leg   . Rhinitis, chronic   . 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 APPENDECTOMY  1972  . COLONOSCOPY  X 4   Most recent was 2007 [place], [place]), with removal of polyps  in the first two.  02/21/16: tubular adenoma.  Recall 3 yrs (Dr. Ardis Hughs).  . 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 ELECTROCARDIOGRAM  01/29/2009   NORMAL  . LUMBAR LAMINECTOMY  01/2009  . TONSILLECTOMY AND ADENOIDECTOMY     age 56  . URETHRAL DILATION     2 as a child, one as an adult  . VASECTOMY  1994  . VITRECTOMY      His Family History Is Significant For: Family History  Problem Relation Age of Onset  . Heart disease Mother   . Hyperlipidemia Mother   . Stroke Mother   . Diabetes Mother   . Depression Mother   . Alzheimer's disease Mother   . Stroke Father   . Hyperlipidemia Father   . Heart disease Father   . Diabetes Father   . Hypertension Father   . Obesity Father   . Alcohol abuse Sister   . Depression Sister     His Social History Is Significant For: Social History   Socioeconomic History  . Marital status: Married    Spouse name: Bode Pieper  . Number of children: Not on file  . Years of education: Not on file  . Highest education level: Not on file  Occupational History  . Occupation: Retired  Scientific laboratory technician  .  Financial resource strain: Not on file  . Food insecurity:    Worry: Not on file    Inability: Not on file  . Transportation needs:    Medical: Not on file    Non-medical: Not on file  Tobacco Use  . Smoking status: Former Research scientist (life sciences)  . Smokeless tobacco: Never Used  Substance and Sexual Activity  . Alcohol use: Yes    Comment: social, 3-4 drinks month  . Drug use: No  . Sexual activity: Yes    Partners: Female  Lifestyle  . Physical activity:    Days per week: Not on file    Minutes per session: Not on file  . Stress: Not on file  Relationships  . Social connections:    Talks on phone: Not on file    Gets together: Not on file    Attends religious service: Not on file    Active member of club or organization: Not on file    Attends meetings of clubs or organizations: Not on file    Relationship status: Not on file  Other Topics Concern  . Not on file  Social History Narrative   Divorced, then remarried.  Has 3 sons, no grandchildren.   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   No tobacco.  Rare ETOH.  No drug abuse.   Enjoys reading and spending time with his 2 dogs.    His Allergies Are:  Allergies  Allergen Reactions  . Simvastatin Other (See Comments)    myalgias  . Antihistamines, Diphenhydramine-Type Other (See Comments)    depression  . Codeine Nausea Only  . Penicillins Hives  . Sulfa Antibiotics Hives  :   His Current Medications Are:  Outpatient Encounter Medications as of 08/11/2018  Medication Sig  . albuterol (PROVENTIL HFA;VENTOLIN HFA) 108 (90 Base) MCG/ACT inhaler Inhale into the lungs every 6 (six) hours as needed for wheezing or shortness of breath.  [name] aspirin 325 MG tablet Take 325 mg by mouth daily.    [name] atorvastatin (LIPITOR) 80 MG tablet TAKE 1 TABLET BY MOUTH ONCE DAILY  .  Choline Fenofibrate (FENOFIBRIC ACID) 135 MG CPDR TAKE 1 CAPSULE BY MOUTH ONCE DAILY  . escitalopram  (LEXAPRO) 20 MG tablet TAKE 1 TABLET(20 MG) BY MOUTH DAILY  . finasteride (PROSCAR) 5 MG tablet TAKE 1 TABLET BY MOUTH ONCE DAILY  . fluticasone (FLONASE) 50 MCG/ACT nasal spray Place into both nostrils daily.  . Insulin Detemir (LEVEMIR FLEXTOUCH) 100 UNIT/ML Pen Inject 60 Units into the skin daily at 10 pm. 52 U SQ qhs (Patient taking differently: Inject 60 Units into the skin daily at 10 pm. )  . Insulin Pen Needle (BD PEN NEEDLE NANO U/F) 32G X 4 MM MISC USE FOR INSULIN INJECTION FOUR TIMES A DAY  . liraglutide (VICTOZA) 18 MG/3ML SOPN Inject 0.3 mLs (1.8 mg total) into the skin every morning.  [name] lisinopril (PRINIVIL,ZESTRIL) 5 MG tablet Take 1 tablet (5 mg total) by mouth daily.  [name] MELATONIN PO Take by mouth.  . metFORMIN (GLUCOPHAGE) 500 MG tablet TAKE 2 TABLETS BY MOUTH TWICE A DAY WITH FOOD  . Naproxen Sodium (ALEVE) 220 MG CAPS Take 2 capsules by mouth at bedtime as needed.    . Omega-3 Fatty Acids (FISH OIL) 1000 MG CAPS Take 1 capsule by mouth 2 (two) times daily.  . ONE TOUCH ULTRA TEST test strip TEST THREE TIMES DAILY  . SALINE NASAL SPRAY NA Place into the nose daily as needed.  . tamsulosin (FLOMAX) 0.4 MG CAPS capsule TAKE 2 CAPSULES BY MOUTH AT BEDTIME  . vitamin B-12 (CYANOCOBALAMIN) 1000 MCG tablet Take 1,000 mcg by mouth daily.    . [DISCONTINUED] lisinopril (PRINIVIL,ZESTRIL) 20 MG tablet TAKE 1 TABLET ONCE DAILY (Patient taking differently: Take 1/2 tab daily)  . [DISCONTINUED] metFORMIN (GLUCOPHAGE) 500 MG tablet TAKE 2 TABLETS BY MOUTH TWICE A DAY WITH FOOD  . [DISCONTINUED] oxybutynin (DITROPAN) 5 MG tablet 1 tab po qhs for overactive bladder   No facility-administered encounter medications on file as of 08/11/2018.   :  Review of Systems:  Out of a complete 14 point review of systems, all are reviewed and negative with the exception of these symptoms as listed below: Review of Systems  Neurological:       Pt presents today to discuss his cpap. Pt reports that his  cpap is going well.    Objective:  Neurological Exam  Physical Exam Physical Examination:   Vitals:   08/11/18 1301  BP: 119/70  Pulse: 89    General Examination: The patient is a very pleasant 67 y.o. male in no acute distress. He appears well-developed and well-nourished and well groomed.   HEENT: Normocephalic, atraumatic, pupils are equal, round and reactive to light and accommodation. Extraocular tracking is good without limitation to gaze excursion or nystagmus noted. Normal smooth pursuit is noted. Hearing is grossly intact. Face is symmetric with normal facial animation and normal facial sensation. Speech is clear with no dysarthria noted. There is no hypophonia. There is no lip, neck/head, jaw or voice tremor. Neck shows FROM. There are no carotid bruits on auscultation. Oropharynx exam reveals: mild mouth dryness, adequate dental hygiene and moderate airway crowdin.   Chest: Clear to auscultation without wheezing, rhonchi or crackles noted.  Heart: S1+S2+0, regular and normal without murmurs, rubs or gallops noted.   Abdomen: Soft, non-tender and non-distended with normal bowel sounds appreciated on auscultation.  Extremities: There is trace pitting edema in the distal lower extremities bilaterally.   Skin: Warm and dry without trophic changes noted.  Musculoskeletal: exam reveals no obvious joint deformities,  tenderness or joint swelling or erythema.   Neurologically:  Mental status: The patient is awake, alert and oriented in all 4 spheres. His immediate and remote memory, attention, language skills and fund of knowledge are appropriate. There is no evidence of aphasia, agnosia, apraxia or anomia. Speech is clear with normal prosody and enunciation. Thought process is linear. Mood is normal and affect is normal.  Cranial nerves II - XII are as described above under HEENT exam.  Motor exam: Normal bulk, strength and tone is noted. There is no drift, tremor or  rebound. Fine motor skills and coordination: grossly intact.  Cerebellar testing: No dysmetria or intention tremor. There is no truncal or gait ataxia.  Sensory exam: intact to light touch in the upper and lower extremities.  Gait, station and balance: He stands easily. No veering to one side is noted. No leaning to one side is noted. Posture is age-appropriate and stance is narrow based. Gait shows normal stride length and normal pace. No problems turning are noted.   Assessment and Plan:  In summary, [name] [name] is a very pleasant 67 year old male with an underlying medical history of hypertension, cardiomegaly, hyperlipidemia, depression, degenerative disc disease, BPH, allergic rhinitis, type 2 diabetes, and obesity, who presents for follow-up consultation of his obstructive sleep apnea. He had a baseline sleep study in May 2019, followed by a CPAP titration study in June 2019. He has successfully establish treatment at home with CPAP. He did require quite a bit of pressure. He is suffering from some mouth dryness but overall is quite pleased with the outcome as he feels better rested, has better sleep initiation and less daytime somnolence. In fact, his leg twitching and rest his leg symptoms and especially his leg cramps have improved. He is advised to talk to his wife about potential leg twitching after he has established treatment with CPAP. He did have significant PLMS during both sleep studies. He is commended on his treatment adherence. We mutually agreed to try to reduce the pressure at this point to 16 cm. He is using a fullface mask successfully. He has noticed some variable pressures during the night. It could be some faulty setting in his machine as it should stay stable at 17 cm and we will reduce it to 16 cm. He is reminded to call us or email [name] through my chart in about 6 weeks so we can pull another updated download of the new pressure setting. He is encouraged to talk to his DME  company about troubleshooting his pressure issues at night. He is encouraged to continue with melatonin at night. We can address restless leg symptoms and PLMS if needed in the future. Currently he feels stable. I would like to see him back in 6 months, sooner if needed. I answered all his questions today and he was in agreement. I spent 25 minutes in total face-to-face time with the patient, more than 50% of which was spent in counseling and coordination of care, reviewing test results, reviewing medication and discussing or reviewing the diagnosis of OSA, its prognosis and treatment options. Pertinent laboratory and imaging test results that were available during this visit with the patient were reviewed by me and considered in my medical decision making (see chart for details).

## 2018-08-12 LAB — VITAMIN D 25 HYDROXY (VIT D DEFICIENCY, FRACTURES): Vit D, 25-Hydroxy: 18.2 ng/mL — ABNORMAL LOW (ref 30.0–100.0)

## 2018-08-15 NOTE — Progress Notes (Signed)
Office: [phone]  /  Fax: [phone]   HPI:   Chief Complaint: OBESITY [name] [name] is here to discuss his progress with his obesity treatment plan. He is on the Category 4 plan and is following his eating plan approximately 75 % of the time. He states he is exercising 0 minutes 0 times per week. [name] [name] is doing well with Category 4. His hunger is somewhat controlled. He is having increased hunger in the evening and sometimes in the afternoon. His weight is 248 lb (112.5 kg) today and has had a weight loss of 1 pound over a period of 3 weeks since his last visit. He has lost 24 lbs since starting treatment with [name].  Diabetes II [name] [name] has a diagnosis of diabetes type II. He is taking Levemir 60 units HS, Victoza 1.8 mg and metformin 500mg . [name] [name] states FBGs have been labile ranging between 90 and 120's and 2 hour post prandial BGs are ranging from 120's to 150's. His last A1c was 6.3. He has been working on intensive lifestyle modifications including diet, exercise, and weight loss to help control his blood glucose levels.  Hypertension [name] [name] is a 67 y.o. male with hypertension. [name] [name] denies lightheadededness and hypotension. He is working on weight loss to help control his blood pressure with the goal of decreasing his risk of heart attack and stroke. [name] [name] was taking Lisinopril 10 mg daily, but he was having some lows, and his Lisinopril was decreased to 5 mg per his PCP and his blood pressure is currently well controlled.   History of Vitamin D deficiency [name] [name] has had a diagnosis of vitamin D deficiency. He is not currently taking vit D and has not had recent lab work. He denies nausea, vomiting or muscle weakness.  ALLERGIES: Allergies  Allergen Reactions  . Simvastatin Other (See Comments)    myalgias  . Antihistamines, Diphenhydramine-Type Other (See Comments)    depression  . Codeine Nausea Only  . Penicillins Hives  . Sulfa Antibiotics Hives     MEDICATIONS: Current Outpatient Medications on File Prior to Visit  Medication Sig Dispense Refill  . albuterol (PROVENTIL HFA;VENTOLIN HFA) 108 (90 Base) MCG/ACT inhaler Inhale into the lungs every 6 (six) hours as needed for wheezing or shortness of breath.    [name] [name] Kitchen aspirin 325 MG tablet Take 325 mg by mouth daily.      [name] [name] Kitchen atorvastatin (LIPITOR) 80 MG tablet TAKE 1 TABLET BY MOUTH ONCE DAILY 90 tablet 1  . Choline Fenofibrate (FENOFIBRIC ACID) 135 MG CPDR TAKE 1 CAPSULE BY MOUTH ONCE DAILY 90 capsule 0  . escitalopram (LEXAPRO) 20 MG tablet TAKE 1 TABLET(20 MG) BY MOUTH DAILY 30 tablet 0  . finasteride (PROSCAR) 5 MG tablet TAKE 1 TABLET BY MOUTH ONCE DAILY 90 tablet 1  . fluticasone (FLONASE) 50 MCG/ACT nasal spray Place into both nostrils daily.    . Insulin Detemir (LEVEMIR FLEXTOUCH) 100 UNIT/ML Pen Inject 60 Units into the skin daily at 10 pm. 52 U SQ qhs (Patient taking differently: Inject 60 Units into the skin daily at 10 pm. ) 5 pen 3  . Insulin Pen Needle (BD PEN NEEDLE NANO U/F) 32G X 4 MM MISC USE FOR INSULIN INJECTION FOUR TIMES A DAY 100 each 12  . liraglutide (VICTOZA) 18 MG/3ML SOPN Inject 0.3 mLs (1.8 mg total) into the skin every morning. 3 pen 0  . lisinopril (PRINIVIL,ZESTRIL) 5 MG tablet Take 1 tablet (5 mg total) by mouth daily. 30 tablet 3  .  MELATONIN PO Take by mouth.    . metFORMIN (GLUCOPHAGE) 500 MG tablet TAKE 2 TABLETS BY MOUTH TWICE A DAY WITH FOOD 360 tablet 1  . Naproxen Sodium (ALEVE) 220 MG CAPS Take 2 capsules by mouth at bedtime as needed.      . Omega-3 Fatty Acids (FISH OIL) 1000 MG CAPS Take 1 capsule by mouth 2 (two) times daily.    . ONE TOUCH ULTRA TEST test strip TEST THREE TIMES DAILY 100 each 11  . SALINE NASAL SPRAY NA Place into the nose daily as needed.    . tamsulosin (FLOMAX) 0.4 MG CAPS capsule TAKE 2 CAPSULES BY MOUTH AT BEDTIME 180 capsule 1  . vitamin B-12 (CYANOCOBALAMIN) 1000 MCG tablet Take 1,000 mcg by mouth daily.       No current  facility-administered medications on file prior to visit.     PAST MEDICAL HISTORY: Past Medical History:  Diagnosis Date  . Allergic rhinitis, unspecified    Allergy testing via allergist 04/2017--mostly neg.  Dr. Donneta Romberg recommended referral to ENT so i did this.  . Back pain   . BPH (benign prostatic hypertrophy)   . Chronic nasal congestion   . DDD (degenerative disc disease), lumbar 2010   laminectomy 01/2009  . Depression    Hospitalized for suicidal ideation 59.  Has been on lexapro since 10/2008.  . Diabetes mellitus type II    Dx'd approximately 06/2008.  No retinopathy as of 02/2017 ophth.  . Erectile dysfunction   . Erectile dysfunction   . History of colon polyps 01/2016   Recall 3 yrs  . History of pneumonia 2008   Hospitalized  . History of scarlet fever    age 45  . Hyperlipidemia, mixed 1993   myalgias on pravastatin  . Hypertension 2009   "marked chronic cardiomegaly" on CXR 01/2009 per old records.  . 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 Insomnia   . Keratoconus    dx'd 1973  . Memory changes   . OSA on CPAP 2004; 2019   Back on CPAP 04/2018  . Osteoarthritis of both knees    Mild plain film changes in medial compartment bilat  . Restless leg   . Rhinitis, chronic   . 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 APPENDECTOMY  1972  . COLONOSCOPY  X 4   Most recent was 2007 [place], [place]), with removal of polyps in the first two.  02/21/16: tubular adenoma.  Recall 3 yrs (Dr. Ardis Hughs).  . 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 ELECTROCARDIOGRAM  01/29/2009   NORMAL  . LUMBAR LAMINECTOMY  01/2009  . TONSILLECTOMY AND ADENOIDECTOMY     age 84  . URETHRAL DILATION     2 as a child, one as an adult  . VASECTOMY  1994  . VITRECTOMY      SOCIAL HISTORY: Social History   Tobacco Use  . Smoking status: Former  Research scientist (life sciences)  . Smokeless tobacco: Never Used  Substance Use Topics  . Alcohol use: Yes    Comment: social, 3-4 drinks month  . Drug use: No    FAMILY HISTORY: Family History  Problem Relation Age of Onset  . Heart disease Mother   . Hyperlipidemia Mother   . Stroke Mother   . Diabetes Mother   .  Depression Mother   . Alzheimer's disease Mother   . Stroke Father   . Hyperlipidemia Father   . Heart disease Father   . Diabetes Father   . Hypertension Father   . Obesity Father   . Alcohol abuse Sister   . Depression Sister     ROS: Review of Systems  Constitutional: Positive for weight loss.  Cardiovascular:       Negative for hypotension.  Gastrointestinal: Negative for nausea and vomiting.  Musculoskeletal:       Negative for muscle weakness.  Neurological:       Negative for lightheadedness.    PHYSICAL EXAM: Blood pressure 108/67, pulse 72, temperature (!) 97.5 F (36.4 C), temperature source Oral, height 5\' 9"  (1.753 m), weight 248 lb (112.5 kg), SpO2 98 %. Body mass index is 36.62 kg/m. Physical Exam  Constitutional: He is oriented to person, place, and time. He appears well-developed and well-nourished.  Cardiovascular: Normal rate.  Pulmonary/Chest: Effort normal.  Musculoskeletal: Normal range of motion.  Neurological: He is oriented to person, place, and time.  Skin: Skin is warm and dry.  Psychiatric: He has a normal mood and affect. His behavior is normal.  Vitals reviewed.   RECENT LABS AND TESTS: BMET    Component Value Date/Time   NA 137 08/10/2018 1033   NA 136 02/14/2018 0848   K 4.2 08/10/2018 1033   CL 103 08/10/2018 1033   CO2 26 08/10/2018 1033   GLUCOSE 113 (H) 08/10/2018 1033   BUN 25 (H) 08/10/2018 1033   BUN 26 02/14/2018 0848   CREATININE 0.97 08/10/2018 1033   CREATININE 0.94 01/18/2017 0832   CALCIUM 9.8 08/10/2018 1033   GFRNONAA 79 02/14/2018 0848   GFRAA 91 02/14/2018 0848   Lab Results  Component Value Date   HGBA1C 6.3  08/10/2018   HGBA1C 6.5 05/09/2018   HGBA1C 7.6 (H) 02/04/2018   HGBA1C 7.1 11/05/2017   HGBA1C 8.6 04/29/2017   No results found for: INSULIN CBC    Component Value Date/Time   WBC 7.2 02/14/2018 0848   WBC 6.2 05/21/2014 1541   RBC 4.63 02/14/2018 0848   RBC 4.45 05/21/2014 1541   HGB 13.4 02/14/2018 0848   HCT 39.3 02/14/2018 0848   PLT 211.0 05/21/2014 1541   MCV 85 02/14/2018 0848   MCH 28.9 02/14/2018 0848   MCHC 34.1 02/14/2018 0848   MCHC 33.3 05/21/2014 1541   RDW 14.1 02/14/2018 0848   LYMPHSABS 2.5 02/14/2018 0848   MONOABS 0.4 05/21/2014 1541   EOSABS 0.2 02/14/2018 0848   BASOSABS 0.0 02/14/2018 0848   Iron/TIBC/Ferritin/ %Sat No results found for: IRON, TIBC, FERRITIN, IRONPCTSAT Lipid Panel     Component Value Date/Time   CHOL 117 08/10/2018 1033   TRIG 91.0 08/10/2018 1033   HDL 44.60 08/10/2018 1033   CHOLHDL 3 08/10/2018 1033   VLDL 18.2 08/10/2018 1033   LDLCALC 55 08/10/2018 1033   LDLDIRECT 112.0 07/31/2016 1116   Hepatic Function Panel     Component Value Date/Time   PROT 6.8 02/14/2018 0848   ALBUMIN 4.4 02/14/2018 0848   AST 16 02/14/2018 0848   ALT 23 02/14/2018 0848   ALKPHOS 35 (L) 02/14/2018 0848   BILITOT 0.4 02/14/2018 0848      Component Value Date/Time   TSH 2.030 02/14/2018 0848   TSH 1.46 05/21/2014 1541   TSH 2.50 05/10/2013 0832   Results for Margarita Rana "TOM" (MRN [id number]) as of 08/15/2018 14:50  Ref. Range 08/11/2018  14:17  Vitamin D, 25-Hydroxy Latest Ref Range: 30.0 - 100.0 ng/mL 18.2 (L)    ASSESSMENT AND PLAN: Type 2 diabetes mellitus without complication, with long-term current use of insulin ([place]) - Plan: VITAMIN D 25 Hydroxy (Vit-D Deficiency, Fractures)  Essential hypertension - Plan: VITAMIN D 25 Hydroxy (Vit-D Deficiency, Fractures)  History of vitamin D deficiency - Plan: VITAMIN D 25 Hydroxy (Vit-D Deficiency, Fractures)  Class 2 severe obesity with serious comorbidity and body mass index  (BMI) of 36.0 to 36.9 in adult, unspecified obesity type ([place])  PLAN:  Diabetes II [name] [name] has been given extensive diabetes education by myself today, including ideal fasting and post-prandial blood glucose readings, individual ideal Hgb A1c goals and hypoglycemia prevention. We discussed the importance of good blood sugar control to decrease the likelihood of diabetic complications such as nephropathy, neuropathy, limb loss, blindness, coronary artery disease, and death. We discussed the importance of intensive lifestyle modification including diet, exercise and weight loss as the first line treatment for diabetes. [name] [name] agrees to continue to track his FBS and 2 hour post prandial numbers. He will continue all his diabetes medications and will follow up in 2 weeks.  Hypertension We discussed sodium restriction, working on healthy weight loss, and a regular exercise program as the means to achieve improved blood pressure control. [name] [name] agreed with this plan and agreed to follow up as directed. We will continue to monitor his blood pressure as well as his progress with the above lifestyle modifications. He will continue his medications and decrease Lisinopril to 5 mg per his PCP and will watch for signs of hypotension as he continues his lifestyle modifications.  History of Vitamin D Deficiency [name] [name] was informed that low vitamin D levels contributes to fatigue and are associated with obesity, breast, and colon cancer. We will check vitamin D labs today, and will start him on vitamin D if indicated. [name] [name] agrees to continue to eat vitamin D rich foods.  Obesity [name] [name] is currently in the action stage of change. As such, his goal is to continue with weight loss efforts.  He has agreed to follow the Category 4 plan. He agrees to increase his water intake and will stop skipping meals. [name] [name] has been instructed to walk the dogs 3 times a week and work up to a goal of 150 minutes of combined  cardio and strengthening exercise per week for weight loss and overall health benefits. We discussed the following Behavioral Modification Strategies today: increasing lean protein intake, decreasing simple carbohydrates, and no skipping meals.  [name] [name] has agreed to follow up with our clinic in 2 weeks. He was informed of the importance of frequent follow up visits to maximize his success with intensive lifestyle modifications for his multiple health conditions.   OBESITY BEHAVIORAL INTERVENTION VISIT  Today's visit was # 10   Starting weight: 272 lbs Starting date: 02/05/18 Today's weight : Weight: 248 lb (112.5 kg)  Today's date: 08/11/2018 Total lbs lost to date: 24 At least 15 minutes were spent on discussing the following behavioral intervention visit.   ASK: We discussed the diagnosis of obesity with Margarita Rana today and Pawan agreed to give [name] permission to discuss obesity behavioral modification therapy today.  ASSESS: Burdett has the diagnosis of obesity and his BMI today is 36.88. Japheth is in the action stage of change.   ADVISE: Diontae was educated on the multiple health risks of obesity as well as the benefit of weight loss to improve his health. He  was advised of the need for long term treatment and the importance of lifestyle modifications to improve his current health and to decrease his risk of future health problems.  AGREE: Multiple dietary modification options and treatment options were discussed and Alfonzo agreed to follow the recommendations documented in the above note.  ARRANGE: Mccormick was educated on the importance of frequent visits to treat obesity as outlined per CMS and USPSTF guidelines and agreed to schedule his next follow up appointment today.  I, [name] [name], am acting as Location manager for [name] [name]. Owens Shark, DO  I have reviewed the above documentation for accuracy and completeness, and I agree with the above. -Jearld Lesch, DO

## 2018-08-19 ENCOUNTER — Encounter: Payer: Self-pay | Admitting: Family Medicine

## 2018-08-19 ENCOUNTER — Ambulatory Visit (INDEPENDENT_AMBULATORY_CARE_PROVIDER_SITE_OTHER): Payer: Medicare Other | Admitting: Family Medicine

## 2018-08-19 VITALS — BP 100/62 | HR 67 | Temp 98.2°F | Resp 16 | Ht 70.0 in | Wt 257.0 lb

## 2018-08-19 DIAGNOSIS — L989 Disorder of the skin and subcutaneous tissue, unspecified: Secondary | ICD-10-CM | POA: Diagnosis not present

## 2018-08-19 DIAGNOSIS — B079 Viral wart, unspecified: Secondary | ICD-10-CM | POA: Diagnosis not present

## 2018-08-19 NOTE — Progress Notes (Signed)
OFFICE VISIT  08/19/2018   CC:  Chief Complaint  Patient presents with  . Procedure   HPI:    Patient is a 67 y.o. Caucasian male who presents for skin lesion removal--top of R hand. No new complaints.  Past Medical History:  Diagnosis Date  . Allergic rhinitis, unspecified    Allergy testing via allergist 04/2017--mostly neg.  Dr. Donneta Romberg recommended referral to ENT so i did this.  . Back pain   . BPH (benign prostatic hypertrophy)   . Chronic nasal congestion   . DDD (degenerative disc disease), lumbar 2010   laminectomy 01/2009  . Depression    Hospitalized for suicidal ideation 16.  Has been on lexapro since 10/2008.  . Diabetes mellitus type II    Dx'd approximately 06/2008.  No retinopathy as of 02/2017 ophth.  . Erectile dysfunction   . Erectile dysfunction   . History of colon polyps 01/2016   Recall 3 yrs  . History of pneumonia 2008   Hospitalized  . History of scarlet fever    age 80  . Hyperlipidemia, mixed 1993   myalgias on pravastatin  . Hypertension 2009   "marked chronic cardiomegaly" on CXR 01/2009 per old records.  . 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 Insomnia   . Keratoconus    dx'd 1973  . Memory changes   . OSA on CPAP 2004; 2019   Back on CPAP 04/2018  . Osteoarthritis of both knees    Mild plain film changes in medial compartment bilat  . Restless leg   . Rhinitis, chronic   . 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 APPENDECTOMY  1972  . COLONOSCOPY  X 4   Most recent was 2007 [place], [place]), with removal of polyps in the first two.  02/21/16: tubular adenoma.  Recall 3 yrs (Dr. Ardis Hughs).  . 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 ELECTROCARDIOGRAM  01/29/2009   NORMAL  . LUMBAR LAMINECTOMY  01/2009  . TONSILLECTOMY AND ADENOIDECTOMY     age 76  . URETHRAL DILATION     2 as a child, one as an adult  . VASECTOMY   1994  . VITRECTOMY      Outpatient Medications Prior to Visit  Medication Sig Dispense Refill  . albuterol (PROVENTIL HFA;VENTOLIN HFA) 108 (90 Base) MCG/ACT inhaler Inhale into the lungs every 6 (six) hours as needed for wheezing or shortness of breath.    [name] aspirin 325 MG tablet Take 325 mg by mouth daily.      [name] atorvastatin (LIPITOR) 80 MG tablet TAKE 1 TABLET BY MOUTH ONCE DAILY 90 tablet 1  . Choline Fenofibrate (FENOFIBRIC ACID) 135 MG CPDR TAKE 1 CAPSULE BY MOUTH ONCE DAILY 90 capsule 0  . escitalopram (LEXAPRO) 20 MG tablet TAKE 1 TABLET(20 MG) BY MOUTH DAILY 30 tablet 0  . finasteride (PROSCAR) 5 MG tablet TAKE 1 TABLET BY MOUTH ONCE DAILY 90 tablet 1  . fluticasone (FLONASE) 50 MCG/ACT nasal spray Place into both nostrils daily.    . Insulin Detemir (LEVEMIR FLEXTOUCH) 100 UNIT/ML Pen Inject 60 Units into the skin daily at 10 pm. 52 U SQ qhs (Patient taking differently: Inject 60 Units into the skin daily at 10 pm. ) 5 pen 3  . Insulin Pen Needle (BD PEN NEEDLE NANO U/F) 32G X  4 MM MISC USE FOR INSULIN INJECTION FOUR TIMES A DAY 100 each 12  . liraglutide (VICTOZA) 18 MG/3ML SOPN Inject 0.3 mLs (1.8 mg total) into the skin every morning. 3 pen 0  . lisinopril (PRINIVIL,ZESTRIL) 5 MG tablet Take 1 tablet (5 mg total) by mouth daily. 30 tablet 3  . MELATONIN PO Take by mouth.    . metFORMIN (GLUCOPHAGE) 500 MG tablet TAKE 2 TABLETS BY MOUTH TWICE A DAY WITH FOOD 360 tablet 1  . Naproxen Sodium (ALEVE) 220 MG CAPS Take 2 capsules by mouth at bedtime as needed.      . Omega-3 Fatty Acids (FISH OIL) 1000 MG CAPS Take 1 capsule by mouth 2 (two) times daily.    . ONE TOUCH ULTRA TEST test strip TEST THREE TIMES DAILY 100 each 11  . SALINE NASAL SPRAY NA Place into the nose daily as needed.    . tamsulosin (FLOMAX) 0.4 MG CAPS capsule TAKE 2 CAPSULES BY MOUTH AT BEDTIME 180 capsule 1  . vitamin B-12 (CYANOCOBALAMIN) 1000 MCG tablet Take 1,000 mcg by mouth daily.       No  facility-administered medications prior to visit.     Allergies  Allergen Reactions  . Simvastatin Other (See Comments)    myalgias  . Antihistamines, Diphenhydramine-Type Other (See Comments)    depression  . Codeine Nausea Only  . Penicillins Hives  . Sulfa Antibiotics Hives    ROS As per HPI  PE: Blood pressure 100/62, pulse 67, temperature 98.2 F (36.8 C), temperature source Oral, resp. rate 16, height 5\' 10"  (1.778 m), weight 257 lb (116.6 kg), SpO2 95 %. Gen: Alert, well appearing.  Patient is oriented to person, place, time, and situation. AFFECT: pleasant, lucid thought and speech. R hand dorsal aspect with 2 distinct flesh colored plaques, oval shaped.  One is about 1 cm in size and the other is about 2-3 mm in size.  LABS:  Lab Results  Component Value Date   TSH 2.030 02/14/2018   Lab Results  Component Value Date   WBC 7.2 02/14/2018   HGB 13.4 02/14/2018   HCT 39.3 02/14/2018   MCV 85 02/14/2018   PLT 211.0 05/21/2014   Lab Results  Component Value Date   CREATININE 0.97 08/10/2018   BUN 25 (H) 08/10/2018   NA 137 08/10/2018   K 4.2 08/10/2018   CL 103 08/10/2018   CO2 26 08/10/2018   Lab Results  Component Value Date   ALT 23 02/14/2018   AST 16 02/14/2018   ALKPHOS 35 (L) 02/14/2018   BILITOT 0.4 02/14/2018   Lab Results  Component Value Date   CHOL 117 08/10/2018   Lab Results  Component Value Date   HDL 44.60 08/10/2018   Lab Results  Component Value Date   LDLCALC 55 08/10/2018   Lab Results  Component Value Date   TRIG 91.0 08/10/2018   Lab Results  Component Value Date   CHOLHDL 3 08/10/2018   Lab Results  Component Value Date   PSA 0.04 (L) 05/09/2018   PSA 0.03 (L) 07/04/2015   PSA 0.02 (L) 05/21/2014   Lab Results  Component Value Date   HGBA1C 6.3 08/10/2018    IMPRESSION AND PLAN:  Skin lesions on R hand, one is 1 cm in size and the other is 2-3 mm in size. Shave excision today.  Consent obtained, signed,  in chart. Using sterile procedure I used 2 ml of 1% plain lidocaine for local anesthesia.  Used a dermablade to shave each lesion --completely removed each one.  Minimal bleeding.  No immediate complication.  Pt tolerated procedure well. Wound care instructions given. Specimens sent to pathology.  An After Visit Summary was printed and given to the patient.  FOLLOW UP: Return for keep appt with me for 11/10/18.  Signed:  Crissie Sickles, MD           08/19/2018

## 2018-08-19 NOTE — Patient Instructions (Signed)
Clean the area gently with regular soap, dab dry. Apply neosporin ointment 1-2 times per day and cover with band-aid or piece of gauze.

## 2018-08-30 ENCOUNTER — Other Ambulatory Visit: Payer: Self-pay | Admitting: Family Medicine

## 2018-08-31 ENCOUNTER — Other Ambulatory Visit (INDEPENDENT_AMBULATORY_CARE_PROVIDER_SITE_OTHER): Payer: Self-pay | Admitting: Family Medicine

## 2018-08-31 DIAGNOSIS — F3289 Other specified depressive episodes: Secondary | ICD-10-CM

## 2018-08-31 DIAGNOSIS — E119 Type 2 diabetes mellitus without complications: Secondary | ICD-10-CM

## 2018-08-31 DIAGNOSIS — Z794 Long term (current) use of insulin: Principal | ICD-10-CM

## 2018-09-05 ENCOUNTER — Ambulatory Visit (INDEPENDENT_AMBULATORY_CARE_PROVIDER_SITE_OTHER): Payer: Medicare Other | Admitting: Family Medicine

## 2018-09-05 VITALS — BP 103/65 | HR 75 | Temp 98.1°F | Ht 69.0 in | Wt 250.0 lb

## 2018-09-05 DIAGNOSIS — Z794 Long term (current) use of insulin: Secondary | ICD-10-CM

## 2018-09-05 DIAGNOSIS — E119 Type 2 diabetes mellitus without complications: Secondary | ICD-10-CM

## 2018-09-05 DIAGNOSIS — F3289 Other specified depressive episodes: Secondary | ICD-10-CM | POA: Diagnosis not present

## 2018-09-05 DIAGNOSIS — Z6836 Body mass index (BMI) 36.0-36.9, adult: Secondary | ICD-10-CM | POA: Diagnosis not present

## 2018-09-05 DIAGNOSIS — I10 Essential (primary) hypertension: Secondary | ICD-10-CM

## 2018-09-05 MED ORDER — LIRAGLUTIDE 18 MG/3ML ~~LOC~~ SOPN: 1.8000 mg | PEN_INJECTOR | Freq: Every morning | SUBCUTANEOUS | 0 refills | Status: DC

## 2018-09-05 MED ORDER — ESCITALOPRAM OXALATE 20 MG PO TABS: ORAL_TABLET | ORAL | 0 refills | Status: DC

## 2018-09-06 NOTE — Progress Notes (Signed)
Office: [phone]  /  Fax: [phone]   HPI:   Chief Complaint: OBESITY [name] [name] is here to discuss his progress with his obesity treatment plan. He is on the Category 4 plan and is following his eating plan approximately 70 to 75 % of the time. He states he is exercising 0 minutes 0 times per week. [name] [name] has recently been off the plan a few times. He likes the Category 4 plan and it works well.  His weight is 250 lb (113.4 kg) today and gained 2 lbs since his last visit. He has lost 22 lbs since starting treatment with [name].  Hypertension [name] [name] is a 67 y.o. male with hypertension. [name] [name] denies dizziness. He is working on weight loss to help control his blood pressure with the goal of decreasing his risk of heart attack and stroke. [name] [name] blood pressure is decreased today. He takes tamulosin 0.4mg  also which is contributing to his decreased blood pressure.   Diabetes II [name] [name] has a diagnosis of diabetes type II. [name] [name] states fasting BGs range between 90 and 130 and 2 hour post prandial blood sugars range between 100 to 160. His last A1c was 6.3 on 08/10/18 and is stable, which is down from 6.5 on 05/09/18. He has been working on intensive lifestyle modifications including diet, exercise, and weight loss to help control his blood glucose levels.  Depression with emotional eating behaviors [name] [name] is struggling with emotional eating and using food for comfort to the extent that it is negatively impacting his health. He struggles with cravings in the afternoon. We discussed moving food from breakfast to afternoon.  ALLERGIES: Allergies  Allergen Reactions  . Simvastatin Other (See Comments)    myalgias  . Antihistamines, Diphenhydramine-Type Other (See Comments)    depression  . Codeine Nausea Only  . Penicillins Hives  . Sulfa Antibiotics Hives    MEDICATIONS: Current Outpatient Medications on File Prior to Visit  Medication Sig Dispense Refill  . albuterol  (PROVENTIL HFA;VENTOLIN HFA) 108 (90 Base) MCG/ACT inhaler Inhale into the lungs every 6 (six) hours as needed for wheezing or shortness of breath.    [name] aspirin 325 MG tablet Take 325 mg by mouth daily.      [name] atorvastatin (LIPITOR) 80 MG tablet TAKE 1 TABLET BY MOUTH ONCE DAILY. 90 tablet 1  . Choline Fenofibrate (FENOFIBRIC ACID) 135 MG CPDR TAKE 1 CAPSULE BY MOUTH ONCE DAILY 90 capsule 0  . finasteride (PROSCAR) 5 MG tablet TAKE 1 TABLET BY MOUTH ONCE DAILY 90 tablet 1  . fluticasone (FLONASE) 50 MCG/ACT nasal spray Place into both nostrils daily.    . Insulin Detemir (LEVEMIR FLEXTOUCH) 100 UNIT/ML Pen Inject 60 Units into the skin daily at 10 pm. 52 U SQ qhs (Patient taking differently: Inject 60 Units into the skin daily at 10 pm. ) 5 pen 3  . Insulin Pen Needle (BD PEN NEEDLE NANO U/F) 32G X 4 MM MISC USE FOR INSULIN INJECTION FOUR TIMES A DAY 100 each 12  . lisinopril (PRINIVIL,ZESTRIL) 5 MG tablet Take 1 tablet (5 mg total) by mouth daily. 30 tablet 3  . MELATONIN PO Take by mouth.    . metFORMIN (GLUCOPHAGE) 500 MG tablet TAKE 2 TABLETS BY MOUTH TWICE A DAY WITH FOOD 360 tablet 1  . Naproxen Sodium (ALEVE) 220 MG CAPS Take 2 capsules by mouth at bedtime as needed.      . Omega-3 Fatty Acids (FISH OIL) 1000 MG CAPS Take 1 capsule by mouth 2 (  two) times daily.    . ONE TOUCH ULTRA TEST test strip TEST THREE TIMES DAILY 100 each 11  . SALINE NASAL SPRAY NA Place into the nose daily as needed.    . tamsulosin (FLOMAX) 0.4 MG CAPS capsule TAKE 2 CAPSULES BY MOUTH AT BEDTIME 180 capsule 1  . vitamin B-12 (CYANOCOBALAMIN) 1000 MCG tablet Take 1,000 mcg by mouth daily.       No current facility-administered medications on file prior to visit.     PAST MEDICAL HISTORY: Past Medical History:  Diagnosis Date  . Allergic rhinitis, unspecified    Allergy testing via allergist 04/2017--mostly neg.  Dr. Donneta Romberg recommended referral to ENT so i did this.  . Back pain   . BPH (benign prostatic  hypertrophy)   . Chronic nasal congestion   . DDD (degenerative disc disease), lumbar 2010   laminectomy 01/2009  . Depression    Hospitalized for suicidal ideation 49.  Has been on lexapro since 10/2008.  . Diabetes mellitus type II    Dx'd approximately 06/2008.  No retinopathy as of 02/2017 ophth.  . Erectile dysfunction   . Erectile dysfunction   . History of colon polyps 01/2016   Recall 3 yrs  . History of pneumonia 2008   Hospitalized  . History of scarlet fever    age 60  . Hyperlipidemia, mixed 1993   myalgias on pravastatin  . Hypertension 2009   "marked chronic cardiomegaly" on CXR 01/2009 per old records.  . 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 Insomnia   . Keratoconus    dx'd 1973  . Memory changes   . OSA on CPAP 2004; 2019   Back on CPAP 04/2018  . Osteoarthritis of both knees    Mild plain film changes in medial compartment bilat  . Restless leg   . Rhinitis, chronic   . 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 APPENDECTOMY  1972  . COLONOSCOPY  X 4   Most recent was 2007 [place], [place]), with removal of polyps in the first two.  02/21/16: tubular adenoma.  Recall 3 yrs (Dr. Ardis Hughs).  . 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 ELECTROCARDIOGRAM  01/29/2009   NORMAL  . LUMBAR LAMINECTOMY  01/2009  . TONSILLECTOMY AND ADENOIDECTOMY     age 60  . URETHRAL DILATION     2 as a child, one as an adult  . VASECTOMY  1994  . VITRECTOMY      SOCIAL HISTORY: Social History   Tobacco Use  . Smoking status: Former Research scientist (life sciences)  . Smokeless tobacco: Never Used  Substance Use Topics  . Alcohol use: Yes    Comment: social, 3-4 drinks month  . Drug use: No    FAMILY HISTORY: Family History  Problem Relation Age of Onset  . Heart disease Mother   . Hyperlipidemia Mother   . Stroke Mother   . Diabetes Mother   .  Depression Mother   . Alzheimer's disease Mother   . Stroke Father   . Hyperlipidemia Father   . Heart disease Father   . Diabetes Father   . Hypertension Father   . Obesity Father   . Alcohol abuse Sister   . Depression Sister     ROS: Review of Systems  Constitutional: Negative for weight loss.  Neurological: Negative for dizziness.  Psychiatric/Behavioral: Positive for depression.    PHYSICAL EXAM: Blood pressure 103/65, pulse 75, temperature 98.1 F (36.7 C), temperature source Oral, height 5\' 9"  (1.753 m), weight 250 lb (113.4 kg), SpO2 96 %. Body mass index is 36.92 kg/m. Physical Exam  Constitutional: He is oriented to person, place, and time. He appears well-developed and well-nourished.  Cardiovascular: Normal rate.  Pulmonary/Chest: Effort normal.  Musculoskeletal: Normal range of motion.  Neurological: He is oriented to person, place, and time.  Skin: Skin is warm and dry.  Psychiatric: He has a normal mood and affect. His behavior is normal.  Vitals reviewed.   RECENT LABS AND TESTS: BMET    Component Value Date/Time   NA 137 08/10/2018 1033   NA 136 02/14/2018 0848   K 4.2 08/10/2018 1033   CL 103 08/10/2018 1033   CO2 26 08/10/2018 1033   GLUCOSE 113 (H) 08/10/2018 1033   BUN 25 (H) 08/10/2018 1033   BUN 26 02/14/2018 0848   CREATININE 0.97 08/10/2018 1033   CREATININE 0.94 01/18/2017 0832   CALCIUM 9.8 08/10/2018 1033   GFRNONAA 79 02/14/2018 0848   GFRAA 91 02/14/2018 0848   Lab Results  Component Value Date   HGBA1C 6.3 08/10/2018   HGBA1C 6.5 05/09/2018   HGBA1C 7.6 (H) 02/04/2018   HGBA1C 7.1 11/05/2017   HGBA1C 8.6 04/29/2017   No results found for: INSULIN CBC    Component Value Date/Time   WBC 7.2 02/14/2018 0848   WBC 6.2 05/21/2014 1541   RBC 4.63 02/14/2018 0848   RBC 4.45 05/21/2014 1541   HGB 13.4 02/14/2018 0848   HCT 39.3 02/14/2018 0848   PLT 211.0 05/21/2014 1541   MCV 85 02/14/2018 0848   MCH 28.9 02/14/2018 0848    MCHC 34.1 02/14/2018 0848   MCHC 33.3 05/21/2014 1541   RDW 14.1 02/14/2018 0848   LYMPHSABS 2.5 02/14/2018 0848   MONOABS 0.4 05/21/2014 1541   EOSABS 0.2 02/14/2018 0848   BASOSABS 0.0 02/14/2018 0848   Iron/TIBC/Ferritin/ %Sat No results found for: IRON, TIBC, FERRITIN, IRONPCTSAT Lipid Panel     Component Value Date/Time   CHOL 117 08/10/2018 1033   TRIG 91.0 08/10/2018 1033   HDL 44.60 08/10/2018 1033   CHOLHDL 3 08/10/2018 1033   VLDL 18.2 08/10/2018 1033   LDLCALC 55 08/10/2018 1033   LDLDIRECT 112.0 07/31/2016 1116   Hepatic Function Panel     Component Value Date/Time   PROT 6.8 02/14/2018 0848   ALBUMIN 4.4 02/14/2018 0848   AST 16 02/14/2018 0848   ALT 23 02/14/2018 0848   ALKPHOS 35 (L) 02/14/2018 0848   BILITOT 0.4 02/14/2018 0848      Component Value Date/Time   TSH 2.030 02/14/2018 0848   TSH 1.46 05/21/2014 1541   TSH 2.50 05/10/2013 0832   Results for [name] [name] "[name] [name]" (MRN [id number]) as of 09/06/2018 13:28  Ref. Range 08/11/2018 14:17  Vitamin D, 25-Hydroxy Latest Ref Range: 30.0 - 100.0 ng/mL 18.2 (L)   ASSESSMENT AND PLAN: Essential hypertension  Type 2 diabetes mellitus without complication, with long-term current use of insulin (HCC) - Plan: liraglutide (VICTOZA) 18 MG/3ML SOPN  Other depression - with emotional eating - Plan: escitalopram (LEXAPRO) 20 MG tablet  Class 2 severe obesity with serious comorbidity and body mass index (BMI) of 36.0 to 36.9 in adult, unspecified obesity type ([place])  PLAN:  Hypertension We discussed sodium restriction, working on healthy weight loss, and a regular exercise program  as the means to achieve improved blood pressure control. [name] [name] agreed with this plan and agreed to follow up as directed. We will continue to monitor his blood pressure as well as his progress with the above lifestyle modifications. He agrees to increase his water intake and continue his lisinopril 5mg  and no refills needed as  prescribed. He will watch for signs of hypotension as he continues his lifestyle modifications. He agrees to follow up in 3 weeks.  Diabetes II [name] [name] has been given extensive diabetes education by myself today including ideal fasting and post-prandial blood glucose readings, individual ideal Hgb A1c goals and hypoglycemia prevention. We discussed the importance of good blood sugar control to decrease the likelihood of diabetic complications such as nephropathy, neuropathy, limb loss, blindness, coronary artery disease, and death. We discussed the importance of intensive lifestyle modification including diet, exercise and weight loss as the first line treatment for diabetes. [name] [name] agrees to continue his diet, metformin, insulin, and Victoza. I will refill Victoza 18mg /40mL # 3 pens with no refills and he agrees to follow up at the agreed upon time.  Depression with Emotional Eating Behaviors We discussed behavior modification techniques today to help [name] [name] with his emotional eating and depression. He has agreed to continue to take escitalopram 20mg  qd #30 with no refills and agreed to follow up as directed in 3 weeks.  Obesity [name] [name] is currently in the action stage of change. As such, his goal is to continue with weight loss efforts. He has agreed to follow the Category 4 plan. [name] [name] has been instructed to walk the dog for 3 days a week for 30 minutes and work up to a goal of 150 minutes of combined cardio and strengthening exercise per week for weight loss and overall health benefits. We discussed the following Behavioral Modification Strategies today: decreasing simple carbohydrates, increase H2O intake, decrease eating out, no skipping meals, and emotional eating strategies.  [name] [name] has agreed to follow up with our clinic in 3 weeks. He was informed of the importance of frequent follow up visits to maximize his success with intensive lifestyle modifications for his multiple health  conditions.   OBESITY BEHAVIORAL INTERVENTION VISIT  Today's visit was # 11   Starting weight: 272 lbs Starting date: 02/14/18 Today's weight : Weight: 250 lb (113.4 kg)  Today's date: 09/05/2018 Total lbs lost to date: 22 At least 15 minutes were spent on discussing the following behavioral intervention visit.   ASK: We discussed the diagnosis of obesity with [name] [name] today and [name] [name] agreed to give [name] permission to discuss obesity behavioral modification therapy today.  ASSESS: [name] [name] has the diagnosis of obesity and his BMI today is 36.9. [name] [name] is in the action stage of change.   ADVISE: [name] [name] was educated on the multiple health risks of obesity as well as the benefit of weight loss to improve his health. He was advised of the need for long term treatment and the importance of lifestyle modifications to improve his current health and to decrease his risk of future health problems.  AGREE: Multiple dietary modification options and treatment options were discussed and [name] [name] agreed to follow the recommendations documented in the above note.  ARRANGE: [name] [name] was educated on the importance of frequent visits to treat obesity as outlined per CMS and USPSTF guidelines and agreed to schedule his next follow up appointment today.  [name] [name], am acting as transcriptionist for Starlyn Skeans, MD  I have reviewed the above documentation for accuracy  and completeness, and I agree with the above. -Dennard Nip, MD

## 2018-09-27 ENCOUNTER — Ambulatory Visit (INDEPENDENT_AMBULATORY_CARE_PROVIDER_SITE_OTHER): Payer: Medicare Other | Admitting: Family Medicine

## 2018-09-27 ENCOUNTER — Other Ambulatory Visit (INDEPENDENT_AMBULATORY_CARE_PROVIDER_SITE_OTHER): Payer: Self-pay | Admitting: Family Medicine

## 2018-09-27 VITALS — BP 114/68 | HR 6 | Temp 98.2°F | Ht 69.0 in | Wt 244.0 lb

## 2018-09-27 DIAGNOSIS — F3289 Other specified depressive episodes: Secondary | ICD-10-CM

## 2018-09-27 DIAGNOSIS — Z794 Long term (current) use of insulin: Secondary | ICD-10-CM | POA: Diagnosis not present

## 2018-09-27 DIAGNOSIS — E119 Type 2 diabetes mellitus without complications: Secondary | ICD-10-CM

## 2018-09-27 DIAGNOSIS — Z6836 Body mass index (BMI) 36.0-36.9, adult: Secondary | ICD-10-CM

## 2018-09-27 MED ORDER — ESCITALOPRAM OXALATE 20 MG PO TABS: ORAL_TABLET | ORAL | 0 refills | Status: DC

## 2018-09-27 MED ORDER — LIRAGLUTIDE 18 MG/3ML ~~LOC~~ SOPN: 1.8000 mg | PEN_INJECTOR | Freq: Every morning | SUBCUTANEOUS | 0 refills | Status: DC

## 2018-09-28 ENCOUNTER — Encounter (INDEPENDENT_AMBULATORY_CARE_PROVIDER_SITE_OTHER): Payer: Self-pay | Admitting: Family Medicine

## 2018-09-28 NOTE — Progress Notes (Signed)
Office: [phone]  /  Fax: [phone]   HPI:   Chief Complaint: OBESITY [name] [name] is here to discuss his progress with his obesity treatment plan. He is on the Category 4 plan and is following his eating plan approximately 80 % of the time. He states he is walking the dogs 20 minutes 2 to 3 times per week. [name] [name] has done well with weight loss on his Category 4 plan. He is still snacking, but he is working on Location manager.  His weight is 244 lb (110.7 kg) today and has had a weight loss of 6 pounds over a period of 3 weeks since his last visit. He has lost 28 lbs since starting treatment with [name].  Diabetes II [name] [name] has a diagnosis of diabetes type II. [name] [name] states that his fasting blood sugars have been between 90 to 100 recently. He denies nausea, vomiting, and muscle weakness. His last A1c was 6.3 on 08/10/18. He has been working on intensive lifestyle modifications including diet, exercise, and weight loss to help control his blood glucose levels.  Depression with emotional eating behaviors [name] [name] is struggling with emotional eating and using food for comfort to the extent that it is negatively impacting his health. He often snacks when he is not hungry. [name] [name] sometimes feels he is out of control and then feels guilty that he made poor food choices. His mood is stable on Lexapro and he has decreased emotional eating. He shows no sign of suicidal or homicidal ideations.  ALLERGIES: Allergies  Allergen Reactions  . Simvastatin Other (See Comments)    myalgias  . Antihistamines, Diphenhydramine-Type Other (See Comments)    depression  . Codeine Nausea Only  . Penicillins Hives  . Sulfa Antibiotics Hives    MEDICATIONS: Current Outpatient Medications on File Prior to Visit  Medication Sig Dispense Refill  . albuterol (PROVENTIL HFA;VENTOLIN HFA) 108 (90 Base) MCG/ACT inhaler Inhale into the lungs every 6 (six) hours as needed for wheezing or shortness of  breath.    [name] Kitchen aspirin 325 MG tablet Take 325 mg by mouth daily.      [name] Kitchen atorvastatin (LIPITOR) 80 MG tablet TAKE 1 TABLET BY MOUTH ONCE DAILY. 90 tablet 1  . Choline Fenofibrate (FENOFIBRIC ACID) 135 MG CPDR TAKE 1 CAPSULE BY MOUTH ONCE DAILY 90 capsule 0  . finasteride (PROSCAR) 5 MG tablet TAKE 1 TABLET BY MOUTH ONCE DAILY 90 tablet 1  . fluticasone (FLONASE) 50 MCG/ACT nasal spray Place into both nostrils daily.    . Insulin Detemir (LEVEMIR FLEXTOUCH) 100 UNIT/ML Pen Inject 60 Units into the skin daily at 10 pm. 52 U SQ qhs (Patient taking differently: Inject 60 Units into the skin daily at 10 pm. ) 5 pen 3  . Insulin Pen Needle (BD PEN NEEDLE NANO U/F) 32G X 4 MM MISC USE FOR INSULIN INJECTION FOUR TIMES A DAY 100 each 12  . lisinopril (PRINIVIL,ZESTRIL) 5 MG tablet Take 1 tablet (5 mg total) by mouth daily. 30 tablet 3  . MELATONIN PO Take by mouth.    . metFORMIN (GLUCOPHAGE) 500 MG tablet TAKE 2 TABLETS BY MOUTH TWICE A DAY WITH FOOD 360 tablet 1  . Naproxen Sodium (ALEVE) 220 MG CAPS Take 2 capsules by mouth at bedtime as needed.      . Omega-3 Fatty Acids (FISH OIL) 1000 MG CAPS Take 1 capsule by mouth 2 (two) times daily.    . ONE TOUCH ULTRA TEST test strip TEST THREE TIMES DAILY  100 each 11  . SALINE NASAL SPRAY NA Place into the nose daily as needed.    . tamsulosin (FLOMAX) 0.4 MG CAPS capsule TAKE 2 CAPSULES BY MOUTH AT BEDTIME 180 capsule 1  . vitamin B-12 (CYANOCOBALAMIN) 1000 MCG tablet Take 1,000 mcg by mouth daily.       No current facility-administered medications on file prior to visit.     PAST MEDICAL HISTORY: Past Medical History:  Diagnosis Date  . Allergic rhinitis, unspecified    Allergy testing via allergist 04/2017--mostly neg.  Dr. Donneta Romberg recommended referral to ENT so i did this.  . Back pain   . BPH (benign prostatic hypertrophy)   . Chronic nasal congestion   . DDD (degenerative disc disease), lumbar 2010   laminectomy 01/2009  . Depression     Hospitalized for suicidal ideation 4.  Has been on lexapro since 10/2008.  . Diabetes mellitus type II    Dx'd approximately 06/2008.  No retinopathy as of 02/2017 ophth.  . Erectile dysfunction   . Erectile dysfunction   . History of colon polyps 01/2016   Recall 3 yrs  . History of pneumonia 2008   Hospitalized  . History of scarlet fever    age 55  . Hyperlipidemia, mixed 1993   myalgias on pravastatin  . Hypertension 2009   "marked chronic cardiomegaly" on CXR 01/2009 per old records.  . 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 Insomnia   . Keratoconus    dx'd 1973  . Memory changes   . OSA on CPAP 2004; 2019   Back on CPAP 04/2018  . Osteoarthritis of both knees    Mild plain film changes in medial compartment bilat  . Restless leg   . Rhinitis, chronic   . 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 APPENDECTOMY  1972  . COLONOSCOPY  X 4   Most recent was 2007 [place], [place]), with removal of polyps in the first two.  02/21/16: tubular adenoma.  Recall 3 yrs (Dr. Ardis Hughs).  . 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 ELECTROCARDIOGRAM  01/29/2009   NORMAL  . LUMBAR LAMINECTOMY  01/2009  . TONSILLECTOMY AND ADENOIDECTOMY     age 37  . URETHRAL DILATION     2 as a child, one as an adult  . VASECTOMY  1994  . VITRECTOMY      SOCIAL HISTORY: Social History   Tobacco Use  . Smoking status: Former Research scientist (life sciences)  . Smokeless tobacco: Never Used  Substance Use Topics  . Alcohol use: Yes    Comment: social, 3-4 drinks month  . Drug use: No    FAMILY HISTORY: Family History  Problem Relation Age of Onset  . Heart disease Mother   . Hyperlipidemia Mother   . Stroke Mother   . Diabetes Mother   . Depression Mother   . Alzheimer's disease Mother   . Stroke Father   . Hyperlipidemia Father   . Heart disease Father   . Diabetes Father    . Hypertension Father   . Obesity Father   . Alcohol abuse Sister   . Depression Sister     ROS: Review of Systems  Constitutional: Positive for weight loss.  Gastrointestinal: Negative for nausea and vomiting.  Musculoskeletal:       Negative for  muscle weakness.  Psychiatric/Behavioral: Positive for depression. Negative for suicidal ideas.       Negative for homicidal ideations.    PHYSICAL EXAM: Blood pressure 114/68, pulse (!) 6, temperature 98.2 F (36.8 C), temperature source Oral, height 5\' 9"  (1.753 m), weight 244 lb (110.7 kg), SpO2 98 %. Body mass index is 36.03 kg/m. Physical Exam  Constitutional: He is oriented to person, place, and time. He appears well-developed and well-nourished.  Cardiovascular: Normal rate.  Pulmonary/Chest: Effort normal.  Musculoskeletal: Normal range of motion.  Neurological: He is oriented to person, place, and time.  Skin: Skin is warm and dry.  Psychiatric: He has a normal mood and affect. His behavior is normal.  Vitals reviewed.   RECENT LABS AND TESTS: BMET    Component Value Date/Time   NA 137 08/10/2018 1033   NA 136 02/14/2018 0848   K 4.2 08/10/2018 1033   CL 103 08/10/2018 1033   CO2 26 08/10/2018 1033   GLUCOSE 113 (H) 08/10/2018 1033   BUN 25 (H) 08/10/2018 1033   BUN 26 02/14/2018 0848   CREATININE 0.97 08/10/2018 1033   CREATININE 0.94 01/18/2017 0832   CALCIUM 9.8 08/10/2018 1033   GFRNONAA 79 02/14/2018 0848   GFRAA 91 02/14/2018 0848   Lab Results  Component Value Date   HGBA1C 6.3 08/10/2018   HGBA1C 6.5 05/09/2018   HGBA1C 7.6 (H) 02/04/2018   HGBA1C 7.1 11/05/2017   HGBA1C 8.6 04/29/2017   No results found for: INSULIN CBC    Component Value Date/Time   WBC 7.2 02/14/2018 0848   WBC 6.2 05/21/2014 1541   RBC 4.63 02/14/2018 0848   RBC 4.45 05/21/2014 1541   HGB 13.4 02/14/2018 0848   HCT 39.3 02/14/2018 0848   PLT 211.0 05/21/2014 1541   MCV 85 02/14/2018 0848   MCH 28.9 02/14/2018 0848     MCHC 34.1 02/14/2018 0848   MCHC 33.3 05/21/2014 1541   RDW 14.1 02/14/2018 0848   LYMPHSABS 2.5 02/14/2018 0848   MONOABS 0.4 05/21/2014 1541   EOSABS 0.2 02/14/2018 0848   BASOSABS 0.0 02/14/2018 0848   Iron/TIBC/Ferritin/ %Sat No results found for: IRON, TIBC, FERRITIN, IRONPCTSAT Lipid Panel     Component Value Date/Time   CHOL 117 08/10/2018 1033   TRIG 91.0 08/10/2018 1033   HDL 44.60 08/10/2018 1033   CHOLHDL 3 08/10/2018 1033   VLDL 18.2 08/10/2018 1033   LDLCALC 55 08/10/2018 1033   LDLDIRECT 112.0 07/31/2016 1116   Hepatic Function Panel     Component Value Date/Time   PROT 6.8 02/14/2018 0848   ALBUMIN 4.4 02/14/2018 0848   AST 16 02/14/2018 0848   ALT 23 02/14/2018 0848   ALKPHOS 35 (L) 02/14/2018 0848   BILITOT 0.4 02/14/2018 0848      Component Value Date/Time   TSH 2.030 02/14/2018 0848   TSH 1.46 05/21/2014 1541   TSH 2.50 05/10/2013 0832   Results for Margarita Rana "[name] [name]" (MRN [id number]) as of 09/28/2018 06:45  Ref. Range 08/11/2018 14:17  Vitamin D, 25-Hydroxy Latest Ref Range: 30.0 - 100.0 ng/mL 18.2 (L)   ASSESSMENT AND PLAN: Type 2 diabetes mellitus without complication, with long-term current use of insulin (HCC) - Plan: liraglutide (VICTOZA) 18 MG/3ML SOPN  Other depression - with emotional eating - Plan: escitalopram (LEXAPRO) 20 MG tablet  Class 2 severe obesity with serious comorbidity and body mass index (BMI) of 36.0 to 36.9 in adult, unspecified obesity type ([place])  PLAN:  Diabetes II [name] [name] has  been given extensive diabetes education by myself today including ideal fasting and post-prandial blood glucose readings, individual ideal Hgb A1c goals, and hypoglycemia prevention. We discussed the importance of good blood sugar control to decrease the likelihood of diabetic complications such as nephropathy, neuropathy, limb loss, blindness, coronary artery disease, and death. We discussed the importance of intensive lifestyle  modification including diet, exercise and weight loss as the first line treatment for diabetes. [name] [name] agrees to continue his diabetes medications, including Victoza 1.8mg  #3 pens with no refills. He agrees to follow up at the agreed upon time in 3 weeks.  Depression with Emotional Eating Behaviors We discussed behavior modification techniques today to help [name] [name] deal with his emotional eating and depression. He has agreed to continue to take Lexapro 20 mg qd #30 with no refills and agreed to follow up as directed in 3 weeks.  Obesity [name] [name] is currently in the action stage of change. As such, his goal is to continue with weight loss efforts. He has agreed to follow the Category 4 plan. [name] [name] has been instructed to work up to a goal of 150 minutes of combined cardio and strengthening exercise per week for weight loss and overall health benefits. We discussed the following Behavioral Modification Strategies today: increasing lean protein intake, work on meal planning and easy cooking plans, and holiday eating strategies.   [name] [name] has agreed to follow up with our clinic in 3 weeks. He was informed of the importance of frequent follow up visits to maximize his success with intensive lifestyle modifications for his multiple health conditions.   OBESITY BEHAVIORAL INTERVENTION VISIT  Today's visit was # 12   Starting weight: 272 lbs Starting date: 02/14/18 Today's weight : Weight: 244 lb (110.7 kg)  Today's date: 09/27/2018 Total lbs lost to date: 28 At least 15 minutes were spent on discussing the following behavioral intervention visit.  ASK: We discussed the diagnosis of obesity with Margarita Rana today and [name] [name] agreed to give [name] permission to discuss obesity behavioral modification therapy today.  ASSESS: [name] [name] has the diagnosis of obesity and his BMI today is 36.02. [name] [name] is in the action stage of change.   ADVISE: [name] [name] was educated on the multiple health risks of  obesity as well as the benefit of weight loss to improve his health. He was advised of the need for long term treatment and the importance of lifestyle modifications to improve his current health and to decrease his risk of future health problems.  AGREE: Multiple dietary modification options and treatment options were discussed and Samie agreed to follow the recommendations documented in the above note.  ARRANGE: Evangelos was educated on the importance of frequent visits to treat obesity as outlined per CMS and USPSTF guidelines and agreed to schedule his next follow up appointment today.  I, Marcille Blanco, am acting as transcriptionist for Starlyn Skeans, MD  I have reviewed the above documentation for accuracy and completeness, and I agree with the above. -Dennard Nip, MD

## 2018-09-30 ENCOUNTER — Other Ambulatory Visit: Payer: Self-pay | Admitting: Family Medicine

## 2018-10-18 ENCOUNTER — Encounter (INDEPENDENT_AMBULATORY_CARE_PROVIDER_SITE_OTHER): Payer: Self-pay

## 2018-10-18 ENCOUNTER — Ambulatory Visit (INDEPENDENT_AMBULATORY_CARE_PROVIDER_SITE_OTHER): Payer: Medicare Other | Admitting: Family Medicine

## 2018-10-20 ENCOUNTER — Other Ambulatory Visit: Payer: Self-pay | Admitting: Family Medicine

## 2018-10-20 DIAGNOSIS — H35372 Puckering of macula, left eye: Secondary | ICD-10-CM | POA: Diagnosis not present

## 2018-10-20 DIAGNOSIS — H35352 Cystoid macular degeneration, left eye: Secondary | ICD-10-CM | POA: Diagnosis not present

## 2018-10-20 DIAGNOSIS — H26492 Other secondary cataract, left eye: Secondary | ICD-10-CM | POA: Diagnosis not present

## 2018-10-20 DIAGNOSIS — E119 Type 2 diabetes mellitus without complications: Secondary | ICD-10-CM | POA: Diagnosis not present

## 2018-10-23 ENCOUNTER — Other Ambulatory Visit: Payer: Self-pay | Admitting: Family Medicine

## 2018-10-25 ENCOUNTER — Ambulatory Visit (INDEPENDENT_AMBULATORY_CARE_PROVIDER_SITE_OTHER): Payer: Medicare Other | Admitting: Family Medicine

## 2018-10-25 VITALS — BP 113/69 | HR 72 | Temp 97.6°F | Ht 69.0 in | Wt 248.0 lb

## 2018-10-25 DIAGNOSIS — Z6836 Body mass index (BMI) 36.0-36.9, adult: Secondary | ICD-10-CM | POA: Diagnosis not present

## 2018-10-25 DIAGNOSIS — Z794 Long term (current) use of insulin: Secondary | ICD-10-CM | POA: Diagnosis not present

## 2018-10-25 DIAGNOSIS — E1165 Type 2 diabetes mellitus with hyperglycemia: Secondary | ICD-10-CM

## 2018-10-25 NOTE — Progress Notes (Signed)
Office: [phone]  /  Fax: [phone]   HPI:   Chief Complaint: OBESITY [name] [name] is here to discuss his progress with his obesity treatment plan. He is on the Category 4 plan and is following his eating plan approximately 70% of the time. He states he is walking for 20 minutes 3 times per week. [name] [name] is happy with Category 4 plan. He is creative with recipes for the plan. He likes to cook. [name] [name] Kitchen  His weight is 248 lb (112[name]5 kg) today and has gained 4 pounds since his last visit. He has lost 24 lbs since starting treatment with [name] [name].  Diabetes II [name] [name] has a diagnosis of diabetes type II. [name] [name] is on Victoza, well controlled. Last A1c was 6[name]3. He denies hypoglycemia, nausea, or constipation. He states fasting BGs range between 90 and 110, and 2 hour post prandials range between 130 and 150. He has been working on intensive lifestyle modifications including diet, exercise, and weight loss to help control his blood glucose levels.  ALLERGIES: Allergies  Allergen Reactions  . Simvastatin Other (See Comments)    myalgias  . Antihistamines, Diphenhydramine-Type Other (See Comments)    depression  . Codeine Nausea Only  . Penicillins Hives  . Sulfa Antibiotics Hives    MEDICATIONS: Current Outpatient Medications on File Prior to Visit  Medication Sig Dispense Refill  . albuterol (PROVENTIL HFA;VENTOLIN HFA) 108 (90 Base) MCG/ACT inhaler Inhale into the lungs every 6 (six) hours as needed for wheezing or shortness of breath.    [name] [name] Kitchen aspirin 325 MG tablet Take 325 mg by mouth daily.      [name] [name] Kitchen atorvastatin (LIPITOR) 80 MG tablet TAKE 1 TABLET BY MOUTH ONCE DAILY. 90 tablet 1  . Choline Fenofibrate (FENOFIBRIC ACID) 135 MG CPDR TAKE 1 CAPSULE BY MOUTH ONCE DAILY 90 capsule 0  . escitalopram (LEXAPRO) 20 MG tablet TAKE 1 TABLET(20 MG) BY MOUTH DAILY 30 tablet 0  . finasteride (PROSCAR) 5 MG tablet TAKE 1 TABLET BY MOUTH ONCE DAILY 90 tablet 1  . fluticasone (FLONASE) 50 MCG/ACT nasal spray Place  into both nostrils daily.    . Insulin Pen Needle (BD PEN NEEDLE NANO U/F) 32G X 4 MM MISC USE FOR INSULIN INJECTION FOUR TIMES A DAY 100 each 12  . LEVEMIR FLEXTOUCH 100 UNIT/ML Pen INJECT 60 UNITS UNDER THE SKIN DAILY EVERY NIGHT AT BEDTIME AT 10:00 PM 15 mL 5  . liraglutide (VICTOZA) 18 MG/3ML SOPN Inject 0[name]3 mLs (1[name]8 mg total) into the skin every morning. 3 pen 0  . lisinopril (PRINIVIL,ZESTRIL) 5 MG tablet Take 1 tablet (5 mg total) by mouth daily. 30 tablet 3  . MELATONIN PO Take by mouth.    . metFORMIN (GLUCOPHAGE) 500 MG tablet TAKE 2 TABLETS BY MOUTH TWICE A DAY WITH FOOD 360 tablet 1  . Naproxen Sodium (ALEVE) 220 MG CAPS Take 2 capsules by mouth at bedtime as needed.      . Omega-3 Fatty Acids (FISH OIL) 1000 MG CAPS Take 1 capsule by mouth 2 (two) times daily.    . ONE TOUCH ULTRA TEST test strip TEST THREE TIMES DAILY 100 each 11  . SALINE NASAL SPRAY NA Place into the nose daily as needed.    . tamsulosin (FLOMAX) 0[name]4 MG CAPS capsule TAKE 2 CAPSULES BY MOUTH AT BEDTIME 180 capsule 1  . vitamin B-12 (CYANOCOBALAMIN) 1000 MCG tablet Take 1,000 mcg by mouth daily.       No current facility-administered medications on file prior to visit.  PAST MEDICAL HISTORY: Past Medical History:  Diagnosis Date  . Allergic rhinitis, unspecified    Allergy testing via allergist 04/2017--mostly neg.  Dr. Donneta Romberg recommended referral to ENT so i did this.  . Back pain   . BPH (benign prostatic hypertrophy)   . Chronic nasal congestion   . DDD (degenerative disc disease), lumbar 2010   laminectomy 01/2009  . Depression    Hospitalized for suicidal ideation 7.  Has been on lexapro since 10/2008.  . Diabetes mellitus type II    Dx'd approximately 06/2008.  No retinopathy as of 02/2017 ophth.  . Erectile dysfunction   . Erectile dysfunction   . History of colon polyps 01/2016   Recall 3 yrs  . History of pneumonia 2008   Hospitalized  . History of scarlet fever    age 73  .  Hyperlipidemia, mixed 1993   myalgias on pravastatin  . Hypertension 2009   "marked chronic cardiomegaly" on CXR 01/2009 per old records.  . 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 Insomnia   . Keratoconus    dx'd 1973  . Memory changes   . OSA on CPAP 2004; 2019   Back on CPAP 04/2018  . Osteoarthritis of both knees    Mild plain film changes in medial compartment bilat  . Restless leg   . Rhinitis, chronic   . 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 APPENDECTOMY  1972  . COLONOSCOPY  X 4   Most recent was 2007 [place], [place]), with removal of polyps in the first two.  02/21/16: tubular adenoma.  Recall 3 yrs (Dr. Ardis Hughs).  . 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 ELECTROCARDIOGRAM  01/29/2009   NORMAL  . LUMBAR LAMINECTOMY  01/2009  . TONSILLECTOMY AND ADENOIDECTOMY     age 51  . URETHRAL DILATION     2 as a child, one as an adult  . VASECTOMY  1994  . VITRECTOMY      SOCIAL HISTORY: Social History   Tobacco Use  . Smoking status: Former Research scientist (life sciences)  . Smokeless tobacco: Never Used  Substance Use Topics  . Alcohol use: Yes    Comment: social, 3-4 drinks month  . Drug use: No    FAMILY HISTORY: Family History  Problem Relation Age of Onset  . Heart disease Mother   . Hyperlipidemia Mother   . Stroke Mother   . Diabetes Mother   . Depression Mother   . Alzheimer's disease Mother   . Stroke Father   . Hyperlipidemia Father   . Heart disease Father   . Diabetes Father   . Hypertension Father   . Obesity Father   . Alcohol abuse Sister   . Depression Sister     ROS: Review of Systems  Constitutional: Negative for weight loss.  Gastrointestinal: Negative for constipation and nausea.  Endo/Heme/Allergies:       Negative hypoglycemia    PHYSICAL EXAM: Blood pressure 113/69, pulse 72, temperature 97[name]6 F (36[name]4 C),  temperature source Oral, height 5\' 9"  (1[name]753 m), weight 248 lb (112[name]5 kg), SpO2 97 %. Body mass index is 36[name]62 kg/m. Physical Exam  Constitutional: He is oriented to person, place, and time. He appears well-developed and well-nourished.  Cardiovascular: Normal rate.  Pulmonary/Chest: Effort normal.  Musculoskeletal: Normal range of motion.  Neurological: He is oriented to person, place, and time.  Skin: Skin is warm and dry.  Psychiatric: He has a normal mood and affect. His behavior is normal.  Vitals reviewed.   RECENT LABS AND TESTS: BMET    Component Value Date/Time   NA 137 08/10/2018 1033   NA 136 02/14/2018 0848   K 4[name]2 08/10/2018 1033   CL 103 08/10/2018 1033   CO2 26 08/10/2018 1033   GLUCOSE 113 (H) 08/10/2018 1033   BUN 25 (H) 08/10/2018 1033   BUN 26 02/14/2018 0848   CREATININE 0[name]97 08/10/2018 1033   CREATININE 0[name]94 01/18/2017 0832   CALCIUM 9[name]8 08/10/2018 1033   GFRNONAA 79 02/14/2018 0848   GFRAA 91 02/14/2018 0848   Lab Results  Component Value Date   HGBA1C 6[name]3 08/10/2018   HGBA1C 6[name]5 05/09/2018   HGBA1C 7[name]6 (H) 02/04/2018   HGBA1C 7[name]1 11/05/2017   HGBA1C 8[name]6 04/29/2017   No results found for: INSULIN CBC    Component Value Date/Time   WBC 7[name]2 02/14/2018 0848   WBC 6[name]2 05/21/2014 1541   RBC 4[name]63 02/14/2018 0848   RBC 4[name]45 05/21/2014 1541   HGB 13[name]4 02/14/2018 0848   HCT 39[name]3 02/14/2018 0848   PLT 211[name]0 05/21/2014 1541   MCV 85 02/14/2018 0848   MCH 28[name]9 02/14/2018 0848   MCHC 34[name]1 02/14/2018 0848   MCHC 33[name]3 05/21/2014 1541   RDW 14[name]1 02/14/2018 0848   LYMPHSABS 2[name]5 02/14/2018 0848   MONOABS 0[name]4 05/21/2014 1541   EOSABS 0[name]2 02/14/2018 0848   BASOSABS 0[name]0 02/14/2018 0848   Iron/TIBC/Ferritin/ %Sat No results found for: IRON, TIBC, FERRITIN, IRONPCTSAT Lipid Panel     Component Value Date/Time   CHOL 117 08/10/2018 1033   TRIG 91[name]0 08/10/2018 1033   HDL 44[name]60 08/10/2018 1033   CHOLHDL 3 08/10/2018 1033   VLDL 18[name]2 08/10/2018 1033    LDLCALC 55 08/10/2018 1033   LDLDIRECT 112[name]0 07/31/2016 1116   Hepatic Function Panel     Component Value Date/Time   PROT 6[name]8 02/14/2018 0848   ALBUMIN 4[name]4 02/14/2018 0848   AST 16 02/14/2018 0848   ALT 23 02/14/2018 0848   ALKPHOS 35 (L) 02/14/2018 0848   BILITOT 0[name]4 02/14/2018 0848      Component Value Date/Time   TSH 2[name]030 02/14/2018 0848   TSH 1[name]46 05/21/2014 1541   TSH 2[name]50 05/10/2013 0832    ASSESSMENT AND PLAN: Type 2 diabetes mellitus without complication, with long-term current use of insulin (HCC)  Class 2 severe obesity with serious comorbidity and body mass index (BMI) of 36[name]0 to 36[name]9 in adult, unspecified obesity type ([place])  PLAN:  Diabetes II [name] [name] has been given extensive diabetes education by myself today including ideal fasting and post-prandial blood glucose readings, individual ideal Hgb A1c goals and hypoglycemia prevention. We discussed the importance of good blood sugar control to decrease the likelihood of diabetic complications such as nephropathy, neuropathy, limb loss, blindness, coronary artery disease, and death. We discussed the importance of intensive lifestyle modification including diet, exercise and weight loss as the first line treatment for diabetes. [name] [name] agrees to continue his diabetes medications and he agrees to follow up with our clinic in 3 weeks.  Obesity [name] [name] is currently in the action stage of change. As such, his goal is to continue with weight loss efforts He has agreed to follow the Category 4 plan [name] [name] has been instructed to continue walking for 20 minutes 3 times per weekHughesfor weight loss and overall health benefits.  We discussed possibly adding resistance training twice weekly.  We discussed the following Behavioral Modification Strategies today: holiday eating strategies, increase H20 intake, and planning for success  Thanksgiving handout was provided.  [name] [name] has agreed to follow up with our clinic in 3 weeks. He was  informed of the importance of frequent follow up visits to maximize his success with intensive lifestyle modifications for his multiple health conditions.   OBESITY BEHAVIORAL INTERVENTION VISIT  Today's visit was # 13  Starting weight: 272 lbs Starting date: 02/14/18 Today's weight : 248 lbs  Today's date: 10/25/2018 Total lbs lost to date: 24 At least 15 minutes were spent on discussing the following behavioral intervention visit.   ASK: We discussed the diagnosis of obesity with [name] [name] today and [name] [name] agreed to give [name] [name] permission to discuss obesity behavioral modification therapy today.  ASSESS: [name] [name] has the diagnosis of obesity and his BMI today is 36[name]61 [name] [name] is in the action stage of change   ADVISE: [name] [name] was educated on the multiple health risks of obesity as well as the benefit of weight loss to improve his health. He was advised of the need for long term treatment and the importance of lifestyle modifications to improve his current health and to decrease his risk of future health problems.  AGREE: Multiple dietary modification options and treatment options were discussed and  [name] [name] agreed to follow the recommendations documented in the above note.  ARRANGE: [name] [name] was educated on the importance of frequent visits to treat obesity as outlined per CMS and USPSTF guidelines and agreed to schedule his next follow up appointment today.  Wilhemena Durie, am acting as Location manager for [employer], FNP-C.  I have reviewed the above documentation for accuracy and completeness, and I agree with the above.  - Dawn Whitmire, FNP-C.

## 2018-10-26 ENCOUNTER — Encounter (INDEPENDENT_AMBULATORY_CARE_PROVIDER_SITE_OTHER): Payer: Self-pay | Admitting: Family Medicine

## 2018-11-08 DIAGNOSIS — E113292 Type 2 diabetes mellitus with mild nonproliferative diabetic retinopathy without macular edema, left eye: Secondary | ICD-10-CM | POA: Diagnosis not present

## 2018-11-08 DIAGNOSIS — H2511 Age-related nuclear cataract, right eye: Secondary | ICD-10-CM | POA: Diagnosis not present

## 2018-11-08 DIAGNOSIS — E113291 Type 2 diabetes mellitus with mild nonproliferative diabetic retinopathy without macular edema, right eye: Secondary | ICD-10-CM | POA: Diagnosis not present

## 2018-11-08 DIAGNOSIS — H35352 Cystoid macular degeneration, left eye: Secondary | ICD-10-CM | POA: Diagnosis not present

## 2018-11-10 ENCOUNTER — Ambulatory Visit: Payer: Medicare Other | Admitting: Family Medicine

## 2018-11-14 ENCOUNTER — Ambulatory Visit (INDEPENDENT_AMBULATORY_CARE_PROVIDER_SITE_OTHER): Payer: Medicare Other | Admitting: Family Medicine

## 2018-11-14 ENCOUNTER — Encounter: Payer: Self-pay | Admitting: Family Medicine

## 2018-11-14 VITALS — BP 109/67 | HR 70 | Temp 97.9°F | Resp 16 | Ht 69.0 in | Wt 256.0 lb

## 2018-11-14 DIAGNOSIS — I10 Essential (primary) hypertension: Secondary | ICD-10-CM | POA: Diagnosis not present

## 2018-11-14 DIAGNOSIS — E669 Obesity, unspecified: Secondary | ICD-10-CM | POA: Diagnosis not present

## 2018-11-14 DIAGNOSIS — E118 Type 2 diabetes mellitus with unspecified complications: Secondary | ICD-10-CM | POA: Diagnosis not present

## 2018-11-14 DIAGNOSIS — E7800 Pure hypercholesterolemia, unspecified: Secondary | ICD-10-CM | POA: Diagnosis not present

## 2018-11-14 DIAGNOSIS — M79606 Pain in leg, unspecified: Secondary | ICD-10-CM

## 2018-11-14 DIAGNOSIS — E66812 Obesity, class 2: Secondary | ICD-10-CM

## 2018-11-14 LAB — POCT GLYCOSYLATED HEMOGLOBIN (HGB A1C): Hemoglobin A1C: 5.8 % — AB (ref 4.0–5.6)

## 2018-11-14 NOTE — Progress Notes (Signed)
OFFICE VISIT  11/14/2018   CC:  Chief Complaint  Patient presents with  . Follow-up    RCI, pt is fasting.     HPI:    Patient is a 67 y.o. Caucasian male who presents for 3 mo f/u DM 2, HTN, morb obesity.  DM: morning glucoses into 110-120s.  This is a little up compared to the prior 3 months.  HTN: no home bp checks.  No more orthostatic dizziness since cutting back lisinopril to 10mg  qd and then to 5mg  qd . No hypoglycemia.  Occ over 200 later in day for significant dietary indiscretions.  Obesity: trying to eat according to [facility]: high protein and low fat for the most part.  Activity level down lately due to weather. Having constant muscle pain in backs of both thighs down to level of backs of knees. Seems to have started pretty abruptly about 3 wks ago.  No recent changes in meds. NO paresthesias.   Nothing makes it go away except massage briefly helps.  Worse with walking. Low back hurts on/off during this time.     Past Medical History:  Diagnosis Date  . Allergic rhinitis, unspecified    Allergy testing via allergist 04/2017--mostly neg.  Dr. Donneta Romberg recommended referral to ENT so i did this.  . Back pain   . BPH (benign prostatic hypertrophy)   . Chronic nasal congestion   . DDD (degenerative disc disease), lumbar 2010   laminectomy 01/2009  . Depression    Hospitalized for suicidal ideation 31.  Has been on lexapro since 10/2008.  . Diabetes mellitus type II    Dx'd approximately 06/2008.  No retinopathy as of 02/2017 ophth.  . Erectile dysfunction   . Erectile dysfunction   . History of colon polyps 01/2016   Recall 3 yrs  . History of pneumonia 2008   Hospitalized  . History of scarlet fever    age 54  . Hyperlipidemia, mixed 1993   myalgias on pravastatin  . Hypertension 2009   "marked chronic cardiomegaly" on CXR 01/2009 per old records.  . 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 Insomnia   .  Keratoconus    dx'd 1973  . Memory changes   . OSA on CPAP 2004; 2019   Back on CPAP 04/2018  . Osteoarthritis of both knees    Mild plain film changes in medial compartment bilat  . Restless leg   . Rhinitis, chronic   . 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 APPENDECTOMY  1972  . COLONOSCOPY  X 4   Most recent was 2007 [place], [place]), with removal of polyps in the first two.  02/21/16: tubular adenoma.  Recall 3 yrs (Dr. Ardis Hughs).  . 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 ELECTROCARDIOGRAM  01/29/2009   NORMAL  . LUMBAR LAMINECTOMY  01/2009  . TONSILLECTOMY AND ADENOIDECTOMY     age 51  . URETHRAL DILATION     2 as a child, one as an adult  . VASECTOMY  1994  . VITRECTOMY      Outpatient Medications Prior to Visit  Medication Sig Dispense Refill  . albuterol (PROVENTIL HFA;VENTOLIN HFA) 108 (90 Base) MCG/ACT inhaler Inhale into the lungs every 6 (six) hours as needed for wheezing or shortness of breath.    [name]  aspirin 325 MG tablet Take 325 mg by mouth daily.      [name] atorvastatin (LIPITOR) 80 MG tablet TAKE 1 TABLET BY MOUTH ONCE DAILY. 90 tablet 1  . Choline Fenofibrate (FENOFIBRIC ACID) 135 MG CPDR TAKE 1 CAPSULE BY MOUTH ONCE DAILY 90 capsule 0  . escitalopram (LEXAPRO) 20 MG tablet TAKE 1 TABLET(20 MG) BY MOUTH DAILY 30 tablet 0  . finasteride (PROSCAR) 5 MG tablet TAKE 1 TABLET BY MOUTH ONCE DAILY 90 tablet 1  . fluticasone (FLONASE) 50 MCG/ACT nasal spray Place into both nostrils daily.    . Insulin Pen Needle (BD PEN NEEDLE NANO U/F) 32G X 4 MM MISC USE FOR INSULIN INJECTION FOUR TIMES A DAY 100 each 12  . LEVEMIR FLEXTOUCH 100 UNIT/ML Pen INJECT 60 UNITS UNDER THE SKIN DAILY EVERY NIGHT AT BEDTIME AT 10:00 PM 15 mL 5  . liraglutide (VICTOZA) 18 MG/3ML SOPN Inject 0.3 mLs (1.8 mg total) into the skin every morning. 3 pen 0  . lisinopril (PRINIVIL,ZESTRIL) 5 MG tablet Take 1  tablet (5 mg total) by mouth daily. 30 tablet 3  . MELATONIN PO Take by mouth.    . metFORMIN (GLUCOPHAGE) 500 MG tablet TAKE 2 TABLETS BY MOUTH TWICE A DAY WITH FOOD 360 tablet 1  . Naproxen Sodium (ALEVE) 220 MG CAPS Take 2 capsules by mouth at bedtime as needed.      . Omega-3 Fatty Acids (FISH OIL) 1000 MG CAPS Take 1 capsule by mouth 2 (two) times daily.    . ONE TOUCH ULTRA TEST test strip TEST THREE TIMES DAILY 100 each 11  . SALINE NASAL SPRAY NA Place into the nose daily as needed.    . tamsulosin (FLOMAX) 0.4 MG CAPS capsule TAKE 2 CAPSULES BY MOUTH AT BEDTIME 180 capsule 1  . vitamin B-12 (CYANOCOBALAMIN) 1000 MCG tablet Take 1,000 mcg by mouth daily.       No facility-administered medications prior to visit.     Allergies  Allergen Reactions  . Simvastatin Other (See Comments)    myalgias  . Antihistamines, Diphenhydramine-Type Other (See Comments)    depression  . Codeine Nausea Only  . Penicillins Hives  . Sulfa Antibiotics Hives    ROS As per HPI  PE: Blood pressure 109/67, pulse 70, temperature 97.9 F (36.6 C), temperature source Oral, resp. rate 16, height 5\' 9"  (1.753 m), weight 256 lb (116.1 kg), SpO2 98 %. Gen: Alert, well appearing.  Patient is oriented to person, place, time, and situation. AFFECT: pleasant, lucid thought and speech. Gets onto exam table without signif difficulty. Gluts and hamstrings w/out tenderness to deep palpation.  LE strength 5/5/ prox/dist bilat. Patellar DTRs 1+ bilat, achilles DTRs 1+ bilat.  LABS:  Lab Results  Component Value Date   TSH 2.030 02/14/2018   Lab Results  Component Value Date   WBC 7.2 02/14/2018   HGB 13.4 02/14/2018   HCT 39.3 02/14/2018   MCV 85 02/14/2018   PLT 211.0 05/21/2014   Lab Results  Component Value Date   CREATININE 0.97 08/10/2018   BUN 25 (H) 08/10/2018   NA 137 08/10/2018   K 4.2 08/10/2018   CL 103 08/10/2018   CO2 26 08/10/2018   Lab Results  Component Value Date   ALT 23  02/14/2018   AST 16 02/14/2018   ALKPHOS 35 (L) 02/14/2018   BILITOT 0.4 02/14/2018   Lab Results  Component Value Date   CHOL 117 08/10/2018   Lab Results  Component  Value Date   HDL 44.60 08/10/2018   Lab Results  Component Value Date   LDLCALC 55 08/10/2018   Lab Results  Component Value Date   TRIG 91.0 08/10/2018   Lab Results  Component Value Date   CHOLHDL 3 08/10/2018   Lab Results  Component Value Date   PSA 0.04 (L) 05/09/2018   PSA 0.03 (L) 07/04/2015   PSA 0.02 (L) 05/21/2014   Lab Results  Component Value Date   HGBA1C 6.3 08/10/2018   POC A1c 5.8% today.  IMPRESSION AND PLAN:  1) DM 2:  POC HbA1c today: 5.8% Decrease levemir to 55 U SQ qhs. He is UTD on eye exam as of 2 wks ago.  2) HTN: The current medical regimen is effective;  continue present plan and medications. Lytes/cr stable 3 mo ago.  Will repeat these in 3 mo.  3) HLD: tolerating statin well.  I doubt his current hamstring pains are coming from this med. Lipid panel 07/2018 excellent.  4) Hamstring mm pain bilat: no sign that this is spinal stenosis pain.  Doubtful statin-induced. He is not using any meds for this, and we discussed trial of daily celebrex for a couple weeks. He was agreeable to this approach but would rather try hamstring stretches on regular basis first.  An After Visit Summary was printed and given to the patient.  FOLLOW UP: Return in about 3 months (around 02/13/2019).  Signed:  Crissie Sickles, MD           11/14/2018

## 2018-11-14 NOTE — Patient Instructions (Addendum)
Decrease your levemir to 55 Units SQ at bedtime.   Hamstring Strain Rehab Ask your health care provider which exercises are safe for you. Do exercises exactly as told by your health care provider and adjust them as directed. It is normal to feel mild stretching, pulling, tightness, or discomfort as you do these exercises, but you should stop right away if you feel sudden pain or your pain gets worse.Do not begin these exercises until told by your health care provider. Strengthening exercises These exercises build strength and endurance in your thighs. Endurance is the ability to use your muscles for a long time, even after your muscles get tired. Exercise A: Straight leg raises ( hip extensors) 1. Lie on your belly on a firm surface. 2. Tense the muscles in your buttocks to lift your left / right leg about 4 inches (10 cm). Keep your knee straight. 3. If you cannot lift your leg this high without arching your back, place a pillow under your hips. 4. Hold the position for __________ seconds. 5. Slowly lower your leg to the starting position and allow it to relax completely before you start the next repetition. Repeat __________ times. Complete this exercise __________ times a day. Exercise B: Bridge ( hip extensors) 1. Lie on your back on a firm surface with your knees bent and your feet flat on the floor. 2. Tighten your buttocks muscles and lift your bottom off the floor until your trunk is level with your thighs. ? You should feel the muscles working in your buttocks and the back of your thighs. ? Do not arch your back. 3. Hold this position for __________ seconds. 4. Slowly lower your hips to the starting position. 5. Let your buttocks muscles relax completely between repetitions. Repeat __________ times. Complete this exercise __________ times a day. If told by your health care provider, keep your bottom lifted off the floor while you slowly walk your feet away from you as far as you  can control. Hold for __________ seconds, then slowly walk your feet back toward you. Exercise C: Lateral walking with band ( hip abductors) 1. Stand in a long hallway. 2. Wrap a loop of exercise band around your legs, just above your knees. 3. Bend your knees gently and drop your hips down and back so your weight is over your heels. 4. Step to the side to move down the length of the hallway, keeping your toes pointed ahead of you and keeping tension in the band. 5. Repeat, leading with your other leg. Repeat __________ times. Complete this exercise __________ times a day. Exercise D: Single leg stand with reaching ( eccentric hamstring) 1. Stand on your left / right foot. Keep your big toe down on the floor and try to keep your arch lifted. 2. Slowly reach down toward the floor as far as you can while keeping your balance. 3. Hold this position for __________ seconds. Repeat __________ times. Complete this exercise __________ times a day. Exercise E: Prone plank ( abdominals and core) 1. Lie on your belly on the floor and prop yourself up on your elbows. Your hands should be straight out in front of you, and your elbows should be below your shoulders. Position your feet so the balls of your feet touch the ground. The ball of your foot is on the walking surface, right under your toes. 2. Tighten your abdominal muscles and lift your body off the floor. ? Do not arch your back. ? Do not hold  your breath. 3. Hold this position for __________ seconds. Repeat __________ times. Complete this exercise __________ times a day. This information is not intended to replace advice given to you by your health care provider. Make sure you discuss any questions you have with your health care provider. Document Released: 11/16/2005 Document Revised: 07/23/2016 Document Reviewed: 08/15/2015 Elsevier Interactive Patient Education  [employer].

## 2018-11-16 ENCOUNTER — Ambulatory Visit (INDEPENDENT_AMBULATORY_CARE_PROVIDER_SITE_OTHER): Payer: Medicare Other | Admitting: Family Medicine

## 2018-11-16 ENCOUNTER — Encounter (INDEPENDENT_AMBULATORY_CARE_PROVIDER_SITE_OTHER): Payer: Self-pay | Admitting: Family Medicine

## 2018-11-16 VITALS — BP 112/65 | HR 79 | Temp 97.8°F | Ht 69.0 in | Wt 252.0 lb

## 2018-11-16 DIAGNOSIS — Z6837 Body mass index (BMI) 37.0-37.9, adult: Secondary | ICD-10-CM | POA: Diagnosis not present

## 2018-11-16 DIAGNOSIS — E119 Type 2 diabetes mellitus without complications: Secondary | ICD-10-CM | POA: Diagnosis not present

## 2018-11-16 DIAGNOSIS — Z794 Long term (current) use of insulin: Secondary | ICD-10-CM | POA: Diagnosis not present

## 2018-11-16 DIAGNOSIS — F3289 Other specified depressive episodes: Secondary | ICD-10-CM | POA: Diagnosis not present

## 2018-11-16 MED ORDER — ESCITALOPRAM OXALATE 20 MG PO TABS: ORAL_TABLET | ORAL | 0 refills | Status: DC

## 2018-11-16 MED ORDER — LIRAGLUTIDE 18 MG/3ML ~~LOC~~ SOPN: 1.8000 mg | PEN_INJECTOR | Freq: Every morning | SUBCUTANEOUS | 0 refills | Status: DC

## 2018-11-17 ENCOUNTER — Other Ambulatory Visit: Payer: Self-pay | Admitting: Family Medicine

## 2018-11-17 NOTE — Progress Notes (Signed)
Office: [phone]  /  Fax: [phone]   HPI:   Chief Complaint: OBESITY [name] [name] is here to discuss his progress with his obesity treatment plan. He is on the Category 4 plan and is following his eating plan approximately 60 % of the time. He states he is walking the dogs 20 minutes 2 times per week. [name] [name] has been traveling and eating out more in the last month. He notes his carb cravings have increased with increased simple carbs.  His weight is 252 lb (114.3 kg) today and has had a weight gain of 4 pounds over a period of 3 weeks since his last visit. He has lost 20 lbs since starting treatment with [name].  Diabetes II [name] [name] has a diagnosis of diabetes type II. The most recent A1c in Epic was 5.8 on 11/14/18 and the last A1c by his PCP was 5.5. He is doing well on his medications and his Levemir was decreased to 56 units qhs. He is checking his blood sugar every morning, but not after eating. His 1 hour post prandial glucose in our office was 191. He has been working on intensive lifestyle modifications including diet, exercise, and weight loss to help control his blood glucose levels.  Depression with emotional eating behaviors [name] [name] is struggling with emotional eating and using food for comfort to the extent that it is negatively impacting his health. He often snacks when he is not hungry. [name] [name] sometimes feels he is out of control and then feels guilty that he made poor food choices. He has been working on behavior modification techniques to help reduce his emotional eating and has been somewhat successful. His mood is stable on Lexapro and feels more in control of his emotional eating. He shows no sign of suicidal or homicidal ideations.  ASSESSMENT AND PLAN:  Type 2 diabetes mellitus without complication, with long-term current use of insulin ([place]) - Plan: liraglutide (VICTOZA) 18 MG/3ML SOPN  Other depression - with emotional eating - Plan: escitalopram (LEXAPRO) 20 MG  tablet  Class 2 severe obesity with serious comorbidity and body mass index (BMI) of 37.0 to 37.9 in adult, unspecified obesity type ([place])  PLAN:  Diabetes II [name] [name] has been given extensive diabetes education by myself today including ideal fasting and post-prandial blood glucose readings, individual ideal Hgb A1c goals, and hypoglycemia prevention. We discussed the importance of good blood sugar control to decrease the likelihood of diabetic complications such as nephropathy, neuropathy, limb loss, blindness, coronary artery disease, and death. We discussed the importance of intensive lifestyle modification including diet, exercise and weight loss as the first line treatment for diabetes. Ka agrees to continue his Victoza 1.8mg  qd #3 pens with no refills. He will also continue with his diet, weight loss, and to bring in his blood sugar log at the next visit in 3 to 4 weeks. [name] [name] agreed with this plan.  Depression with Emotional Eating Behaviors We discussed behavior modification techniques today to help [name] deal with his emotional eating and depression. He has agreed to continue to take Lexapro 20mg  qd #30 with no refills and agreed to follow up as directed.  Obesity [name] [name] is currently in the action stage of change. As such, his goal is to continue with weight loss efforts He has agreed to follow the Category 4 plan. [name] [name] has been instructed to work up to a goal of 150 minutes of combined cardio and strengthening exercise per week for weight loss and overall health benefits. We discussed the following  Behavioral Modification Strategies today: increasing lean protein intake, decreasing simple carbohydrates, increase H2O intake, holiday eating strategies, and celebration eating strategies.  [name] [name] has agreed to follow up with our clinic in 3 to 4 weeks. He was informed of the importance of frequent follow up visits to maximize his success with intensive lifestyle modifications for his  multiple health conditions.  ALLERGIES: Allergies  Allergen Reactions  . Simvastatin Other (See Comments)    myalgias  . Antihistamines, Diphenhydramine-Type Other (See Comments)    depression  . Codeine Nausea Only  . Penicillins Hives  . Sulfa Antibiotics Hives    MEDICATIONS: Current Outpatient Medications on File Prior to Visit  Medication Sig Dispense Refill  . albuterol (PROVENTIL HFA;VENTOLIN HFA) 108 (90 Base) MCG/ACT inhaler Inhale into the lungs every 6 (six) hours as needed for wheezing or shortness of breath.    [name] [name] aspirin 325 MG tablet Take 325 mg by mouth daily.      [name] [name] atorvastatin (LIPITOR) 80 MG tablet TAKE 1 TABLET BY MOUTH ONCE DAILY. 90 tablet 1  . Choline Fenofibrate (FENOFIBRIC ACID) 135 MG CPDR TAKE 1 CAPSULE BY MOUTH ONCE DAILY 90 capsule 0  . finasteride (PROSCAR) 5 MG tablet TAKE 1 TABLET BY MOUTH ONCE DAILY 90 tablet 1  . fluticasone (FLONASE) 50 MCG/ACT nasal spray Place into both nostrils daily.    . Insulin Pen Needle (BD PEN NEEDLE NANO U/F) 32G X 4 MM MISC USE FOR INSULIN INJECTION FOUR TIMES A DAY 100 each 12  . LEVEMIR FLEXTOUCH 100 UNIT/ML Pen INJECT 60 UNITS UNDER THE SKIN DAILY EVERY NIGHT AT BEDTIME AT 10:00 PM 15 mL 5  . lisinopril (PRINIVIL,ZESTRIL) 5 MG tablet Take 1 tablet (5 mg total) by mouth daily. 30 tablet 3  . MELATONIN PO Take by mouth.    . metFORMIN (GLUCOPHAGE) 500 MG tablet TAKE 2 TABLETS BY MOUTH TWICE A DAY WITH FOOD 360 tablet 1  . Naproxen Sodium (ALEVE) 220 MG CAPS Take 2 capsules by mouth at bedtime as needed.      . Omega-3 Fatty Acids (FISH OIL) 1000 MG CAPS Take 1 capsule by mouth 2 (two) times daily.    . ONE TOUCH ULTRA TEST test strip TEST THREE TIMES DAILY 100 each 11  . SALINE NASAL SPRAY NA Place into the nose daily as needed.    . tamsulosin (FLOMAX) 0.4 MG CAPS capsule TAKE 2 CAPSULES BY MOUTH AT BEDTIME 180 capsule 1  . vitamin B-12 (CYANOCOBALAMIN) 1000 MCG tablet Take 1,000 mcg by mouth daily.       No current  facility-administered medications on file prior to visit.     PAST MEDICAL HISTORY: Past Medical History:  Diagnosis Date  . Allergic rhinitis, unspecified    Allergy testing via allergist 04/2017--mostly neg.  Dr. Donneta Romberg recommended referral to ENT so i did this.  . Back pain   . BPH (benign prostatic hypertrophy)   . Chronic nasal congestion   . DDD (degenerative disc disease), lumbar 2010   laminectomy 01/2009  . Depression    Hospitalized for suicidal ideation 37.  Has been on lexapro since 10/2008.  . Diabetes mellitus type II    Dx'd approximately 06/2008.  No retinopathy as of 02/2017 ophth.  . Erectile dysfunction   . Erectile dysfunction   . History of colon polyps 01/2016   Recall 3 yrs  . History of pneumonia 2008   Hospitalized  . History of scarlet fever    age 54  . Hyperlipidemia,  mixed 1993   myalgias on pravastatin  . Hypertension 2009   "marked chronic cardiomegaly" on CXR 01/2009 per old records.  . 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 Insomnia   . Keratoconus    dx'd 1973  . Memory changes   . OSA on CPAP 2004; 2019   Back on CPAP 04/2018  . Osteoarthritis of both knees    Mild plain film changes in medial compartment bilat  . Restless leg   . Rhinitis, chronic   . 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 APPENDECTOMY  1972  . COLONOSCOPY  X 4   Most recent was 2007 [place], [place]), with removal of polyps in the first two.  02/21/16: tubular adenoma.  Recall 3 yrs (Dr. Ardis Hughs).  . 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 ELECTROCARDIOGRAM  01/29/2009   NORMAL  . LUMBAR LAMINECTOMY  01/2009  . TONSILLECTOMY AND ADENOIDECTOMY     age 45  . URETHRAL DILATION     2 as a child, one as an adult  . VASECTOMY  1994  . VITRECTOMY      SOCIAL HISTORY: Social History   Tobacco Use  . Smoking status: Former  Research scientist (life sciences)  . Smokeless tobacco: Never Used  Substance Use Topics  . Alcohol use: Yes    Comment: social, 3-4 drinks month  . Drug use: No    FAMILY HISTORY: Family History  Problem Relation Age of Onset  . Heart disease Mother   . Hyperlipidemia Mother   . Stroke Mother   . Diabetes Mother   . Depression Mother   . Alzheimer's disease Mother   . Stroke Father   . Hyperlipidemia Father   . Heart disease Father   . Diabetes Father   . Hypertension Father   . Obesity Father   . Alcohol abuse Sister   . Depression Sister     ROS: Review of Systems  Constitutional: Negative for weight loss.  Psychiatric/Behavioral: Positive for depression. Negative for suicidal ideas.       Negative for homicidal ideations.   PHYSICAL EXAM: Blood pressure 112/65, pulse 79, temperature 97.8 F (36.6 C), temperature source Oral, height 5\' 9"  (1.753 m), weight 252 lb (114.3 kg), SpO2 97 %. Body mass index is 37.21 kg/m. Physical Exam Vitals signs reviewed.  Constitutional:      Appearance: Normal appearance. He is obese.  Cardiovascular:     Rate and Rhythm: Normal rate.  Pulmonary:     Effort: Pulmonary effort is normal.  Musculoskeletal: Normal range of motion.  Skin:    General: Skin is warm and dry.  Neurological:     Mental Status: He is alert and oriented to person, place, and time.  Psychiatric:        Mood and Affect: Mood normal.        Behavior: Behavior normal.    RECENT LABS AND TESTS: BMET    Component Value Date/Time   NA 137 08/10/2018 1033   NA 136 02/14/2018 0848   K 4.2 08/10/2018 1033   CL 103 08/10/2018 1033   CO2 26 08/10/2018 1033   GLUCOSE 113 (H) 08/10/2018 1033   BUN 25 (H) 08/10/2018 1033   BUN 26 02/14/2018 0848   CREATININE 0.97 08/10/2018 1033   CREATININE 0.94 01/18/2017 0832   CALCIUM  9.8 08/10/2018 1033   GFRNONAA 79 02/14/2018 0848   GFRAA 91 02/14/2018 0848   Lab Results  Component Value Date   HGBA1C 5.8 (A) 11/14/2018   HGBA1C 6.3  08/10/2018   HGBA1C 6.5 05/09/2018   HGBA1C 7.6 (H) 02/04/2018   HGBA1C 7.1 11/05/2017   No results found for: INSULIN CBC    Component Value Date/Time   WBC 7.2 02/14/2018 0848   WBC 6.2 05/21/2014 1541   RBC 4.63 02/14/2018 0848   RBC 4.45 05/21/2014 1541   HGB 13.4 02/14/2018 0848   HCT 39.3 02/14/2018 0848   PLT 211.0 05/21/2014 1541   MCV 85 02/14/2018 0848   MCH 28.9 02/14/2018 0848   MCHC 34.1 02/14/2018 0848   MCHC 33.3 05/21/2014 1541   RDW 14.1 02/14/2018 0848   LYMPHSABS 2.5 02/14/2018 0848   MONOABS 0.4 05/21/2014 1541   EOSABS 0.2 02/14/2018 0848   BASOSABS 0.0 02/14/2018 0848   Iron/TIBC/Ferritin/ %Sat No results found for: IRON, TIBC, FERRITIN, IRONPCTSAT Lipid Panel     Component Value Date/Time   CHOL 117 08/10/2018 1033   TRIG 91.0 08/10/2018 1033   HDL 44.60 08/10/2018 1033   CHOLHDL 3 08/10/2018 1033   VLDL 18.2 08/10/2018 1033   LDLCALC 55 08/10/2018 1033   LDLDIRECT 112.0 07/31/2016 1116   Hepatic Function Panel     Component Value Date/Time   PROT 6.8 02/14/2018 0848   ALBUMIN 4.4 02/14/2018 0848   AST 16 02/14/2018 0848   ALT 23 02/14/2018 0848   ALKPHOS 35 (L) 02/14/2018 0848   BILITOT 0.4 02/14/2018 0848      Component Value Date/Time   TSH 2.030 02/14/2018 0848   TSH 1.46 05/21/2014 1541   TSH 2.50 05/10/2013 0832   Results for Margarita Rana "TOM" (MRN [id number]) as of 11/17/2018 09:49  Ref. Range 08/11/2018 14:17  Vitamin D, 25-Hydroxy Latest Ref Range: 30.0 - 100.0 ng/mL 18.2 (L)    OBESITY BEHAVIORAL INTERVENTION VISIT  Today's visit was # 14   Starting weight: 272 lbs Starting date: 02/14/18 Today's weight : Weight: 252 lb (114.3 kg)  Today's date: 11/16/2018 Total lbs lost to date: 20 At least 15 minutes were spent on discussing the following behavioral intervention visit.  ASK: We discussed the diagnosis of obesity with Margarita Rana today and Ryland agreed to give [name] permission to discuss obesity  behavioral modification therapy today.  ASSESS: Amiri has the diagnosis of obesity and his BMI today is 37.2. Tres is in the action stage of change.   ADVISE: Jonh was educated on the multiple health risks of obesity as well as the benefit of weight loss to improve his health. He was advised of the need for long term treatment and the importance of lifestyle modifications to improve his current health and to decrease his risk of future health problems.  AGREE: Multiple dietary modification options and treatment options were discussed and Mohannad agreed to follow the recommendations documented in the above note.  ARRANGE: Blythe was educated on the importance of frequent visits to treat obesity as outlined per CMS and USPSTF guidelines and agreed to schedule his next follow up appointment today.  I, Marcille Blanco, am acting as transcriptionist for Starlyn Skeans, MD I have reviewed the above documentation for accuracy and completeness, and I agree with the above. -Dennard Nip, MD

## 2018-11-21 ENCOUNTER — Encounter: Payer: Self-pay | Admitting: Family Medicine

## 2018-12-06 ENCOUNTER — Other Ambulatory Visit: Payer: Self-pay | Admitting: Family Medicine

## 2018-12-15 ENCOUNTER — Encounter (INDEPENDENT_AMBULATORY_CARE_PROVIDER_SITE_OTHER): Payer: Self-pay | Admitting: Family Medicine

## 2018-12-15 ENCOUNTER — Ambulatory Visit (INDEPENDENT_AMBULATORY_CARE_PROVIDER_SITE_OTHER): Payer: Medicare Other | Admitting: Family Medicine

## 2018-12-15 VITALS — BP 110/67 | HR 77 | Ht 69.0 in | Wt 247.0 lb

## 2018-12-15 DIAGNOSIS — Z794 Long term (current) use of insulin: Secondary | ICD-10-CM | POA: Diagnosis not present

## 2018-12-15 DIAGNOSIS — E119 Type 2 diabetes mellitus without complications: Secondary | ICD-10-CM | POA: Diagnosis not present

## 2018-12-15 DIAGNOSIS — Z6836 Body mass index (BMI) 36.0-36.9, adult: Secondary | ICD-10-CM

## 2018-12-15 DIAGNOSIS — F3289 Other specified depressive episodes: Secondary | ICD-10-CM

## 2018-12-15 MED ORDER — ESCITALOPRAM OXALATE 20 MG PO TABS: ORAL_TABLET | ORAL | 0 refills | Status: DC

## 2018-12-15 MED ORDER — LIRAGLUTIDE 18 MG/3ML ~~LOC~~ SOPN: 1.8000 mg | PEN_INJECTOR | Freq: Every morning | SUBCUTANEOUS | 0 refills | Status: DC

## 2018-12-19 NOTE — Progress Notes (Signed)
Office: [phone]  /  Fax: [phone]   HPI:   Chief Complaint: OBESITY [name] [name] is here to discuss his progress with his obesity treatment plan. He is on the Category 4 plan and is following his eating plan approximately 75 % of the time. He states he is walking the dogs 20 minutes 2 times per week. Dontrel continues to do well with weight loss. He indulged a bit over the holidays, but was able to get back on track pretty quickly. His hunger is controlled, but he is still struggling with evening cravings.  His weight is 247 lb (112 kg) today and has had a weight loss of 5 pounds over a period of 4 weeks since his last visit. He has lost 25 lbs since starting treatment with [name].  Depression with Anxiety [name] [name] is doing well on [name] [name]. He notes some increased stress with remodeling his kitchen, but otherwise feels good.  Diabetes II [name] [name] has a diagnosis of diabetes type II. [name] [name] states that his fasting BGs range between 110 and 130's and that he does not check 2 hour post prandial blood sugars. His last A1c was well controlled at 5.8 on 11/14/18. He has been working on intensive lifestyle modifications including diet, exercise, and weight loss to help control his blood glucose levels.  ASSESSMENT AND PLAN:  Type 2 diabetes mellitus without complication, with long-term current use of insulin ([name] [name]) - Plan: liraglutide (VICTOZA) 18 MG/3ML SOPN  Other depression - with emotional eating - Plan: [name] [name] ([name] [name]) 20 MG tablet  Class 2 severe obesity with serious comorbidity and body mass index (BMI) of 36.0 to 36.9 in adult, unspecified obesity type ([name] [name])  PLAN:  Diabetes II [name] [name] has been given extensive diabetes education by myself today including ideal fasting and post-prandial blood glucose readings, individual ideal Hgb A1c goals, and hypoglycemia prevention. We discussed the importance of good blood sugar control to decrease the likelihood of diabetic complications such as  nephropathy, neuropathy, limb loss, blindness, coronary artery disease, and death. We discussed the importance of intensive lifestyle modification including diet, exercise and weight loss as the first line treatment for diabetes. [name] [name] agrees to continue his Victoza 0.53mLs SQ qAM #3 pens with no refills and will follow up at the agreed upon time in 4 weeks.  Depression with Anxiety We discussed behavior modification techniques today to help [name] [name] deal with his emotional eating and depression. He has agreed to continue to take [name] [name] 20mg  qd #30 with no refills and agreed to follow up as directed in 4 weeks.  Obesity [name] [name] is currently in the action stage of change. As such, his goal is to continue with weight loss efforts. He has agreed to follow the Category 4 plan. [name] [name] has been instructed to work up to a goal of 150 minutes of combined cardio and strengthening exercise per week for weight loss and overall health benefits. We discussed the following Behavioral Modification Strategies today: increasing lean protein intake, work on meal planning and easy cooking plans, and emotional eating strategies.  [name] [name] has agreed to follow up with our clinic in 4 weeks. He was informed of the importance of frequent follow up visits to maximize his success with intensive lifestyle modifications for his multiple health conditions.  ALLERGIES: Allergies  Allergen Reactions  . Simvastatin Other (See Comments)    myalgias  . Antihistamines, Diphenhydramine-Type Other (See Comments)    depression  . Codeine Nausea Only  . Penicillins Hives  . Sulfa Antibiotics Hives  MEDICATIONS: Current Outpatient Medications on File Prior to Visit  Medication Sig Dispense Refill  . albuterol (PROVENTIL HFA;VENTOLIN HFA) 108 (90 Base) MCG/ACT inhaler Inhale into the lungs every 6 (six) hours as needed for wheezing or shortness of breath.    [name] aspirin 325 MG tablet Take 325 mg by mouth daily.      [name]  atorvastatin (LIPITOR) 80 MG tablet TAKE 1 TABLET BY MOUTH ONCE DAILY. 90 tablet 1  . Choline Fenofibrate (FENOFIBRIC ACID) 135 MG CPDR TAKE 1 CAPSULE BY MOUTH ONCE DAILY 90 capsule 0  . finasteride (PROSCAR) 5 MG tablet TAKE 1 TABLET BY MOUTH ONCE DAILY 90 tablet 1  . fluticasone (FLONASE) 50 MCG/ACT nasal spray Place into both nostrils daily.    . Insulin Pen Needle (BD PEN NEEDLE NANO U/F) 32G X 4 MM MISC USE FOR INSULIN INJECTION FOUR TIMES A DAY 100 each 12  . LEVEMIR FLEXTOUCH 100 UNIT/ML Pen INJECT 60 UNITS UNDER THE SKIN DAILY EVERY NIGHT AT BEDTIME AT 10:00 PM (Patient taking differently: 56 Units. ) 15 mL 5  . lisinopril (PRINIVIL,ZESTRIL) 5 MG tablet Take 1 tablet (5 mg total) by mouth daily. 30 tablet 3  . MELATONIN PO Take by mouth.    . metFORMIN (GLUCOPHAGE) 500 MG tablet TAKE 2 TABLETS BY MOUTH TWICE A DAY WITH FOOD 360 tablet 1  . Naproxen Sodium (ALEVE) 220 MG CAPS Take 2 capsules by mouth at bedtime as needed.      . Omega-3 Fatty Acids (FISH OIL) 1000 MG CAPS Take 1 capsule by mouth 2 (two) times daily.    . ONE TOUCH ULTRA TEST test strip TEST THREE TIMES DAILY 100 each 11  . SALINE NASAL SPRAY NA Place into the nose daily as needed.    . tamsulosin (FLOMAX) 0.4 MG CAPS capsule TAKE 2 CAPSULES BY MOUTH AT BEDTIME 180 capsule 1  . vitamin B-12 (CYANOCOBALAMIN) 1000 MCG tablet Take 1,000 mcg by mouth daily.       No current facility-administered medications on file prior to visit.     PAST MEDICAL HISTORY: Past Medical History:  Diagnosis Date  . Allergic rhinitis, unspecified    Allergy testing via allergist 04/2017--mostly neg.  Dr. Donneta Romberg recommended referral to ENT so i did this.  . Back pain   . BPH (benign prostatic hypertrophy)   . Chronic nasal congestion   . DDD (degenerative disc disease), lumbar 2010   laminectomy 01/2009  . Depression    Hospitalized for suicidal ideation 57.  Has been on [name] [name] since 10/2008.  . Diabetes mellitus type II    Dx'd  approximately 06/2008.  No retinopathy as of 02/2017 ophth.  . Erectile dysfunction   . Erectile dysfunction   . History of colon polyps 01/2016   Recall 3 yrs  . History of pneumonia 2008   Hospitalized  . History of scarlet fever    age 66  . Hyperlipidemia, mixed 1993   myalgias on pravastatin  . Hypertension 2009   "marked chronic cardiomegaly" on CXR 01/2009 per old records.  . 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 Insomnia   . Keratoconus    dx'd 1973  . Memory changes   . OSA on CPAP 2004; 2019   Back on CPAP 04/2018  . Osteoarthritis of both knees    Mild plain film changes in medial compartment bilat  . Restless leg   . Rhinitis, chronic   . 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 APPENDECTOMY  1972  . COLONOSCOPY  X 4   Most recent was 2007 [name] [name], [place]), with removal of polyps in the first two.  02/21/16: tubular adenoma.  Recall 3 yrs (Dr. Ardis Hughs).  . 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 ELECTROCARDIOGRAM  01/29/2009   NORMAL  . LUMBAR LAMINECTOMY  01/2009  . TONSILLECTOMY AND ADENOIDECTOMY     age 54  . URETHRAL DILATION     2 as a child, one as an adult  . VASECTOMY  1994  . VITRECTOMY      SOCIAL HISTORY: Social History   Tobacco Use  . Smoking status: Former Research scientist (life sciences)  . Smokeless tobacco: Never Used  Substance Use Topics  . Alcohol use: Yes    Comment: social, 3-4 drinks month  . Drug use: No    FAMILY HISTORY: Family History  Problem Relation Age of Onset  . Heart disease Mother   . Hyperlipidemia Mother   . Stroke Mother   . Diabetes Mother   . Depression Mother   . Alzheimer's disease Mother   . Stroke Father   . Hyperlipidemia Father   . Heart disease Father   . Diabetes Father   . Hypertension Father   . Obesity Father   . Alcohol abuse Sister   . Depression Sister    ROS: Review of  Systems  Constitutional: Positive for weight loss.  Psychiatric/Behavioral: Positive for depression. The patient is nervous/anxious.    PHYSICAL EXAM: Blood pressure 110/67, pulse 77, height 5\' 9"  (1.753 m), weight 247 lb (112 kg), SpO2 99 %. Body mass index is 36.48 kg/m. Physical Exam Vitals signs reviewed.  Constitutional:      Appearance: Normal appearance. He is obese.  Cardiovascular:     Rate and Rhythm: Normal rate.  Pulmonary:     Effort: Pulmonary effort is normal.  Musculoskeletal: Normal range of motion.  Skin:    General: Skin is warm and dry.  Neurological:     Mental Status: He is alert and oriented to person, place, and time.  Psychiatric:        Mood and Affect: Mood normal.        Behavior: Behavior normal.    RECENT LABS AND TESTS: BMET    Component Value Date/Time   NA 137 08/10/2018 1033   NA 136 02/14/2018 0848   K 4.2 08/10/2018 1033   CL 103 08/10/2018 1033   CO2 26 08/10/2018 1033   GLUCOSE 113 (H) 08/10/2018 1033   BUN 25 (H) 08/10/2018 1033   BUN 26 02/14/2018 0848   CREATININE 0.97 08/10/2018 1033   CREATININE 0.94 01/18/2017 0832   CALCIUM 9.8 08/10/2018 1033   GFRNONAA 79 02/14/2018 0848   GFRAA 91 02/14/2018 0848   Lab Results  Component Value Date   HGBA1C 5.8 (A) 11/14/2018   HGBA1C 6.3 08/10/2018   HGBA1C 6.5 05/09/2018   HGBA1C 7.6 (H) 02/04/2018   HGBA1C 7.1 11/05/2017   No results found for: INSULIN CBC    Component Value Date/Time   WBC 7.2 02/14/2018 0848   WBC 6.2 05/21/2014 1541   RBC 4.63 02/14/2018 0848   RBC 4.45 05/21/2014 1541   HGB 13.4 02/14/2018 0848   HCT 39.3 02/14/2018 0848   PLT 211.0 05/21/2014 1541   MCV 85 02/14/2018 0848   MCH 28.9  02/14/2018 0848   MCHC 34.1 02/14/2018 0848   MCHC 33.3 05/21/2014 1541   RDW 14.1 02/14/2018 0848   LYMPHSABS 2.5 02/14/2018 0848   MONOABS 0.4 05/21/2014 1541   EOSABS 0.2 02/14/2018 0848   BASOSABS 0.0 02/14/2018 0848   Iron/TIBC/Ferritin/ %Sat No results  found for: IRON, TIBC, FERRITIN, IRONPCTSAT Lipid Panel     Component Value Date/Time   CHOL 117 08/10/2018 1033   TRIG 91.0 08/10/2018 1033   HDL 44.60 08/10/2018 1033   CHOLHDL 3 08/10/2018 1033   VLDL 18.2 08/10/2018 1033   LDLCALC 55 08/10/2018 1033   LDLDIRECT 112.0 07/31/2016 1116   Hepatic Function Panel     Component Value Date/Time   PROT 6.8 02/14/2018 0848   ALBUMIN 4.4 02/14/2018 0848   AST 16 02/14/2018 0848   ALT 23 02/14/2018 0848   ALKPHOS 35 (L) 02/14/2018 0848   BILITOT 0.4 02/14/2018 0848      Component Value Date/Time   TSH 2.030 02/14/2018 0848   TSH 1.46 05/21/2014 1541   TSH 2.50 05/10/2013 0832   Results for [name] [name] "[name] [name]" (MRN [id number]) as of 12/19/2018 05:51  Ref. Range 08/11/2018 14:17  Vitamin D, 25-Hydroxy Latest Ref Range: 30.0 - 100.0 ng/mL 18.2 (L)    OBESITY BEHAVIORAL INTERVENTION VISIT  Today's visit was # 15   Starting weight: 272 lbs Starting date: 02/14/18 Today's weight : Weight: 247 lb (112 kg)  Today's date: 12/15/2018 Total lbs lost to date: 25 At least 15 minutes were spent on discussing the following behavioral intervention visit.  ASK: We discussed the diagnosis of obesity with [name] [name] today and Rajat agreed to give [name] permission to discuss obesity behavioral modification therapy today.  ASSESS: [name] [name] has the diagnosis of obesity and his BMI today is 36.4. [name] [name] is in the action stage of change.   ADVISE: [name] [name] was educated on the multiple health risks of obesity as well as the benefit of weight loss to improve his health. He was advised of the need for long term treatment and the importance of lifestyle modifications to improve his current health and to decrease his risk of future health problems.  AGREE: Multiple dietary modification options and treatment options were discussed and [name] [name] agreed to follow the recommendations documented in the above note.  ARRANGE: [name] [name] was educated on the  importance of frequent visits to treat obesity as outlined per CMS and USPSTF guidelines and agreed to schedule his next follow up appointment today.  I, Marcille Blanco, am acting as transcriptionist for Starlyn Skeans, MD  I have reviewed the above documentation for accuracy and completeness, and I agree with the above. -Dennard Nip, MD

## 2019-01-12 ENCOUNTER — Encounter (INDEPENDENT_AMBULATORY_CARE_PROVIDER_SITE_OTHER): Payer: Self-pay

## 2019-01-12 ENCOUNTER — Ambulatory Visit (INDEPENDENT_AMBULATORY_CARE_PROVIDER_SITE_OTHER): Payer: Medicare Other | Admitting: Family Medicine

## 2019-01-16 ENCOUNTER — Encounter (INDEPENDENT_AMBULATORY_CARE_PROVIDER_SITE_OTHER): Payer: Self-pay | Admitting: Physician Assistant

## 2019-01-16 ENCOUNTER — Ambulatory Visit (INDEPENDENT_AMBULATORY_CARE_PROVIDER_SITE_OTHER): Payer: Medicare Other | Admitting: Physician Assistant

## 2019-01-16 VITALS — BP 116/66 | HR 77 | Temp 98.3°F | Ht 69.0 in | Wt 249.0 lb

## 2019-01-16 DIAGNOSIS — Z794 Long term (current) use of insulin: Secondary | ICD-10-CM

## 2019-01-16 DIAGNOSIS — Z6836 Body mass index (BMI) 36.0-36.9, adult: Secondary | ICD-10-CM | POA: Diagnosis not present

## 2019-01-16 DIAGNOSIS — E119 Type 2 diabetes mellitus without complications: Secondary | ICD-10-CM

## 2019-01-16 DIAGNOSIS — F3289 Other specified depressive episodes: Secondary | ICD-10-CM

## 2019-01-16 MED ORDER — LIRAGLUTIDE 18 MG/3ML ~~LOC~~ SOPN: 1.8000 mg | PEN_INJECTOR | Freq: Every morning | SUBCUTANEOUS | 0 refills | Status: DC

## 2019-01-16 MED ORDER — ESCITALOPRAM OXALATE 20 MG PO TABS: ORAL_TABLET | ORAL | 0 refills | Status: DC

## 2019-01-16 NOTE — Progress Notes (Signed)
Office: [phone]  /  Fax: [phone]   HPI:   Chief Complaint: OBESITY [name] [name] is here to discuss his progress with his obesity treatment plan. He is on the Category 4 plan and is following his eating plan approximately 80% of the time. He states he is walking 20 minutes 3 times per week. [name] [name] is frustrated with lack of weight loss. He reports that he has not been measuring his snack calories. His weight is 249 lb (112.9 kg) today and has had a weight gain of 2 pounds since his last visit. He has lost 23 lbs since starting treatment with [name].  Diabetes II [name] [name] has a diagnosis of diabetes type II. [name] [name] states fasting blood sugars range between 84 and 120 and denies any hypoglycemic episodes. Last A1c was 5.8 on 11/14/2018. He has been working on intensive lifestyle modifications including diet, exercise, and weight loss to help control his blood glucose levels. [name] [name] does report polyphagia especially at night.  Depression with emotional eating behaviors [name] [name] reports intermittent sweet and salty cravings. He shows no sign of suicidal or homicidal ideations.  Depression screen [facility] 2/9 08/10/2018 02/14/2018 02/04/2018 11/05/2017 07/08/2017  Decreased Interest 0 2 0 0 0  Down, Depressed, Hopeless 0 1 1 1  0  PHQ - 2 Score 0 3 1 1  0  Altered sleeping 1 1 3 3  -  Tired, decreased energy 2 3 1 2  -  Change in appetite 0 2 0 1 -  Feeling bad or failure about yourself  0 1 0 1 -  Trouble concentrating 1 3 0 1 -  Moving slowly or fidgety/restless 0 1 0 0 -  Suicidal thoughts 0 0 0 0 -  PHQ-9 Score 4 14 5 9  -  Difficult doing work/chores Not difficult at all Not difficult at all Not difficult at all Not difficult at all -   ASSESSMENT AND PLAN:  Type 2 diabetes mellitus without complication, with long-term current use of insulin ([place]) - Plan: liraglutide (VICTOZA) 18 MG/3ML SOPN  Other depression - with emotional eating - Plan: escitalopram (LEXAPRO) 20 MG tablet  Class 2 severe  obesity with serious comorbidity and body mass index (BMI) of 36.0 to 36.9 in adult, unspecified obesity type ([place])  PLAN:  Diabetes II [name] [name] has been given extensive diabetes education by myself today including ideal fasting and post-prandial blood glucose readings, individual ideal Hgb A1c goals  and hypoglycemia prevention. We discussed the importance of good blood sugar control to decrease the likelihood of diabetic complications such as nephropathy, neuropathy, limb loss, blindness, coronary artery disease, and death. We discussed the importance of intensive lifestyle modification including diet, exercise and weight loss as the first line treatment for diabetes. [name] [name] agrees to continue his Victoza #5 with no refills and will follow-up at the agreed upon time.  Depression with Emotional Eating Behaviors We discussed behavior modification techniques today to help [name] [name] deal with his emotional eating and depression. He will continue Lexapro #30 with no refills and agreed to follow-up as directed.  Obesity [name] [name] is currently in the action stage of change. As such, his goal is to continue with weight loss efforts. He has agreed to follow the Category 4 plan. [name] [name] has been instructed to work up to a goal of 150 minutes of combined cardio and strengthening exercise per week for weight loss and overall health benefits. We discussed the following Behavioral Modification Strategies today: work on meal planning and easy cooking plans and ways to avoid boredom eating.  [name] [name] has agreed to follow up with our clinic in 4 weeks. He was informed of the importance of frequent follow up visits to maximize his success with intensive lifestyle modifications for his multiple health conditions.  ALLERGIES: Allergies  Allergen Reactions  . Simvastatin Other (See Comments)    myalgias  . Antihistamines, Diphenhydramine-Type Other (See Comments)    depression  . Codeine Nausea Only  . Penicillins  Hives  . Sulfa Antibiotics Hives    MEDICATIONS: Current Outpatient Medications on File Prior to Visit  Medication Sig Dispense Refill  . albuterol (PROVENTIL HFA;VENTOLIN HFA) 108 (90 Base) MCG/ACT inhaler Inhale into the lungs every 6 (six) hours as needed for wheezing or shortness of breath.    [name] aspirin 325 MG tablet Take 325 mg by mouth daily.      [name] atorvastatin (LIPITOR) 80 MG tablet TAKE 1 TABLET BY MOUTH ONCE DAILY. 90 tablet 1  . Choline Fenofibrate (FENOFIBRIC ACID) 135 MG CPDR TAKE 1 CAPSULE BY MOUTH ONCE DAILY 90 capsule 0  . escitalopram (LEXAPRO) 20 MG tablet TAKE 1 TABLET(20 MG) BY MOUTH DAILY 30 tablet 0  . finasteride (PROSCAR) 5 MG tablet TAKE 1 TABLET BY MOUTH ONCE DAILY 90 tablet 1  . fluticasone (FLONASE) 50 MCG/ACT nasal spray Place into both nostrils daily.    . Insulin Pen Needle (BD PEN NEEDLE NANO U/F) 32G X 4 MM MISC USE FOR INSULIN INJECTION FOUR TIMES A DAY 100 each 12  . LEVEMIR FLEXTOUCH 100 UNIT/ML Pen INJECT 60 UNITS UNDER THE SKIN DAILY EVERY NIGHT AT BEDTIME AT 10:00 PM (Patient taking differently: 56 Units. ) 15 mL 5  . liraglutide (VICTOZA) 18 MG/3ML SOPN Inject 0.3 mLs (1.8 mg total) into the skin every morning. 3 pen 0  . lisinopril (PRINIVIL,ZESTRIL) 5 MG tablet Take 1 tablet (5 mg total) by mouth daily. 30 tablet 3  . MELATONIN PO Take by mouth.    . metFORMIN (GLUCOPHAGE) 500 MG tablet TAKE 2 TABLETS BY MOUTH TWICE A DAY WITH FOOD 360 tablet 1  . Naproxen Sodium (ALEVE) 220 MG CAPS Take 2 capsules by mouth at bedtime as needed.      . Omega-3 Fatty Acids (FISH OIL) 1000 MG CAPS Take 1 capsule by mouth 2 (two) times daily.    . ONE TOUCH ULTRA TEST test strip TEST THREE TIMES DAILY 100 each 11  . SALINE NASAL SPRAY NA Place into the nose daily as needed.    . tamsulosin (FLOMAX) 0.4 MG CAPS capsule TAKE 2 CAPSULES BY MOUTH AT BEDTIME 180 capsule 1  . vitamin B-12 (CYANOCOBALAMIN) 1000 MCG tablet Take 1,000 mcg by mouth daily.       No current  facility-administered medications on file prior to visit.     PAST MEDICAL HISTORY: Past Medical History:  Diagnosis Date  . Allergic rhinitis, unspecified    Allergy testing via allergist 04/2017--mostly neg.  Dr. Donneta Romberg recommended referral to ENT so i did this.  . Back pain   . BPH (benign prostatic hypertrophy)   . Chronic nasal congestion   . DDD (degenerative disc disease), lumbar 2010   laminectomy 01/2009  . Depression    Hospitalized for suicidal ideation 66.  Has been on lexapro since 10/2008.  . Diabetes mellitus type II    Dx'd approximately 06/2008.  No retinopathy as of 02/2017 ophth.  . Erectile dysfunction   . Erectile dysfunction   . History of colon polyps 01/2016   Recall 3 yrs  . History  of pneumonia 2008   Hospitalized  . History of scarlet fever    age 33  . Hyperlipidemia, mixed 1993   myalgias on pravastatin  . Hypertension 2009   "marked chronic cardiomegaly" on CXR 01/2009 per old records.  . 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 Insomnia   . Keratoconus    dx'd 1973  . Memory changes   . OSA on CPAP 2004; 2019   Back on CPAP 04/2018  . Osteoarthritis of both knees    Mild plain film changes in medial compartment bilat  . Restless leg   . Rhinitis, chronic   . 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 APPENDECTOMY  1972  . COLONOSCOPY  X 4   Most recent was 2007 [place], [place]), with removal of polyps in the first two.  02/21/16: tubular adenoma.  Recall 3 yrs (Dr. Ardis Hughs).  . 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 ELECTROCARDIOGRAM  01/29/2009   NORMAL  . LUMBAR LAMINECTOMY  01/2009  . TONSILLECTOMY AND ADENOIDECTOMY     age 17  . URETHRAL DILATION     2 as a child, one as an adult  . VASECTOMY  1994  . VITRECTOMY      SOCIAL HISTORY: Social History   Tobacco Use  . Smoking status: Former  Research scientist (life sciences)  . Smokeless tobacco: Never Used  Substance Use Topics  . Alcohol use: Yes    Comment: social, 3-4 drinks month  . Drug use: No    FAMILY HISTORY: Family History  Problem Relation Age of Onset  . Heart disease Mother   . Hyperlipidemia Mother   . Stroke Mother   . Diabetes Mother   . Depression Mother   . Alzheimer's disease Mother   . Stroke Father   . Hyperlipidemia Father   . Heart disease Father   . Diabetes Father   . Hypertension Father   . Obesity Father   . Alcohol abuse Sister   . Depression Sister    ROS: Review of Systems  Constitutional: Negative for weight loss.  Endo/Heme/Allergies:       Negative for hypoglycemia. Positive for polyphagia especially at night.  Psychiatric/Behavioral: Negative for suicidal ideas.       Negative for homicidal ideas.   PHYSICAL EXAM: Blood pressure 116/66, pulse 77, temperature 98.3 F (36.8 C), temperature source Oral, height 5\' 9"  (1.753 m), weight 249 lb (112.9 kg), SpO2 99 %. Body mass index is 36.77 kg/m. Physical Exam Vitals signs reviewed.  Constitutional:      Appearance: Normal appearance. He is obese.  Cardiovascular:     Rate and Rhythm: Normal rate.     Pulses: Normal pulses.  Pulmonary:     Effort: Pulmonary effort is normal.     Breath sounds: Normal breath sounds.  Musculoskeletal: Normal range of motion.  Skin:    General: Skin is warm and dry.  Neurological:     Mental Status: He is alert and oriented to person, place, and time.  Psychiatric:        Behavior: Behavior normal.        Thought Content: Thought content does not include homicidal or suicidal ideation.   RECENT LABS AND TESTS: BMET    Component Value Date/Time   NA 137 08/10/2018 1033   NA 136 02/14/2018  0848   K 4.2 08/10/2018 1033   CL 103 08/10/2018 1033   CO2 26 08/10/2018 1033   GLUCOSE 113 (H) 08/10/2018 1033   BUN 25 (H) 08/10/2018 1033   BUN 26 02/14/2018 0848   CREATININE 0.97 08/10/2018 1033   CREATININE  0.94 01/18/2017 0832   CALCIUM 9.8 08/10/2018 1033   GFRNONAA 79 02/14/2018 0848   GFRAA 91 02/14/2018 0848   Lab Results  Component Value Date   HGBA1C 5.8 (A) 11/14/2018   HGBA1C 6.3 08/10/2018   HGBA1C 6.5 05/09/2018   HGBA1C 7.6 (H) 02/04/2018   HGBA1C 7.1 11/05/2017   No results found for: INSULIN CBC    Component Value Date/Time   WBC 7.2 02/14/2018 0848   WBC 6.2 05/21/2014 1541   RBC 4.63 02/14/2018 0848   RBC 4.45 05/21/2014 1541   HGB 13.4 02/14/2018 0848   HCT 39.3 02/14/2018 0848   PLT 211.0 05/21/2014 1541   MCV 85 02/14/2018 0848   MCH 28.9 02/14/2018 0848   MCHC 34.1 02/14/2018 0848   MCHC 33.3 05/21/2014 1541   RDW 14.1 02/14/2018 0848   LYMPHSABS 2.5 02/14/2018 0848   MONOABS 0.4 05/21/2014 1541   EOSABS 0.2 02/14/2018 0848   BASOSABS 0.0 02/14/2018 0848   Iron/TIBC/Ferritin/ %Sat No results found for: IRON, TIBC, FERRITIN, IRONPCTSAT Lipid Panel     Component Value Date/Time   CHOL 117 08/10/2018 1033   TRIG 91.0 08/10/2018 1033   HDL 44.60 08/10/2018 1033   CHOLHDL 3 08/10/2018 1033   VLDL 18.2 08/10/2018 1033   LDLCALC 55 08/10/2018 1033   LDLDIRECT 112.0 07/31/2016 1116   Hepatic Function Panel     Component Value Date/Time   PROT 6.8 02/14/2018 0848   ALBUMIN 4.4 02/14/2018 0848   AST 16 02/14/2018 0848   ALT 23 02/14/2018 0848   ALKPHOS 35 (L) 02/14/2018 0848   BILITOT 0.4 02/14/2018 0848      Component Value Date/Time   TSH 2.030 02/14/2018 0848   TSH 1.46 05/21/2014 1541   TSH 2.50 05/10/2013 0832    Ref. Range 08/11/2018 14:17  Vitamin D, 25-Hydroxy Latest Ref Range: 30.0 - 100.0 ng/mL 18.2 (L)   OBESITY BEHAVIORAL INTERVENTION VISIT  Today's visit was #16  Starting weight: 272 lbs Starting date: 02/14/2018 Today's weight: 249 lbs Today's date: 01/16/2019 Total lbs lost to date: 23 lbs At least 15 minutes were spent on discussing the following behavioral intervention visit.  ASK: We discussed the diagnosis of  obesity with Margarita Rana today and Gayle agreed to give [name] permission to discuss obesity behavioral modification therapy today.  ASSESS: Jayton has the diagnosis of obesity and his BMI today is 36.77. Garrette is in the action stage of change   ADVISE: Scotty was educated on the multiple health risks of obesity as well as the benefit of weight loss to improve his health. He was advised of the need for long term treatment and the importance of lifestyle modifications to improve his current health and to decrease his risk of future health problems.  AGREE: Multiple dietary modification options and treatment options were discussed and  Ichiro agreed to follow the recommendations documented in the above note.  ARRANGE: Erma was educated on the importance of frequent visits to treat obesity as outlined per CMS and USPSTF guidelines and agreed to schedule his next follow up appointment today.  Migdalia Dk, am acting as transcriptionist for Abby Potash, PA-C I, Abby Potash, PA-C have reviewed above note and agree  with its content

## 2019-01-17 ENCOUNTER — Other Ambulatory Visit: Payer: Self-pay | Admitting: Family Medicine

## 2019-01-19 ENCOUNTER — Telehealth: Payer: Self-pay | Admitting: *Deleted

## 2019-01-19 NOTE — Telephone Encounter (Signed)
Agree; thx 

## 2019-01-19 NOTE — Telephone Encounter (Signed)
[place] [facility] - Client [place] Patient Name: [name] [name] Gender: Male DOB: 04/02/1951  Age: 68 Y 1 M 5 D Return Phone Number: [phone] (Primary) Address:  City/State/Zip: [place]  [address] Client [facility] [place] [facility] - Client Client Site [place] [facility] Physician Crissie Sickles - MD Contact Type Call Who Is Calling Patient / Member / Family / Caregiver Call Type Triage / Clinical Relationship To Patient Self Return Phone Number [phone] (Primary) Chief Complaint Toe Injury Reason for Call Symptomatic / Request for [place] states that they are calling about having type 2 diabetes and he has a blue toe that he has twisted. Translation No Nurse Assessment Nurse: Carlis Abbott, RN, Estill Bamberg Date/Time (Eastern Time): 01/18/2019 10:24:38 PM Confirm and document reason for call. If symptomatic, describe symptoms. ---Caller states that they are calling about having type 2 diabetes and he has a blue toe that he has twisted. He was walking and the toe got caught on the carpet and twisted. It is his left 2nd toe. Denies bleeding, or other sx. Has the patient traveled to [place] OR had close contact with a person known to have the novel coronavirus illness in the last 14 days? ---Not Applicable Does the patient have any new or worsening symptoms? ---Yes Will a triage be completed? ---Yes Related visit to physician within the last 2 weeks? ---No Does the PT have any chronic conditions? (i.e. diabetes, asthma, this includes High risk factors for pregnancy, etc.) ---Yes List chronic conditions. ---Diabetes, high cholesterol, HTN Is this a behavioral health or substance abuse call? ---No Guidelines Guideline Title Affirmed Question Affirmed Notes Nurse Date/Time Eilene Ghazi Time) Toe Injury Large swelling or bruise Carlis Abbott, RN, Estill Bamberg 01/18/2019 10:26:11 PM Disp.  Time Eilene Ghazi Time) Disposition Final User 01/18/2019 10:33:07 PM See PCP within 24 Hours Yes Carlis Abbott, RN, Estill Bamberg Disposition Overriden: SEE PCP WITHIN 3 DAYS Override Reason: Patient's symptoms need a higher level of care PLEASE NOTE:  All timestamps contained within this report are represented as [place] Standard Time. CONFIDENTIALTY NOTICE: This fax transmission is intended only for the addressee.  It contains information that is legally privileged, confidential or otherwise protected from use or disclosure.  If you are not the intended recipient, you are strictly prohibited from reviewing, disclosing, copying using or disseminating any of this information or taking any action in reliance on or regarding this information.  If you have received this fax in error, please notify us immediately by telephone so that we can arrange for its return to [name]. Phone:  [phone], Toll-Free:  [phone], Fax:  [phone] Page: 2 of 2 Call Id: [id number] [place] Disagree/Comply Comply Caller Understands Yes PreDisposition Go to ED Care Advice Given Per Guideline SEE PCP WITHIN 24 HOURS: * IF OFFICE WILL BE OPEN: You need to be seen within the next 24 hours. Call your doctor (or NP/PA) when the office opens and make an appointment. PAIN MEDICINES: ACETAMINOPHEN (E.G., TYLENOL): LOCAL COLD: * Apply a cold pack or an ice bag (wrapped in a moist towel) to the area for 20 minutes. Repeat in 1 hour, then every 4 hours while awake. * Continue this for the first 48 hours after an injury (Reason: to reduce the swelling and pain). SHOES: If regular shoes cause too much pain, wear open-toe sandals with a firm sole until the injury heals. CALL BACK IF: * Pain becomes worse * You become worse. CARE ADVICE  given per Toe Injury (Adult) guideline. Comments User: Gabriel Earing, RN Date/Time Eilene Ghazi Time): 01/18/2019 10:27:32 PM It is no longer twisted. User: Gabriel Earing, RN Date/Time Eilene Ghazi Time): 01/18/2019 10:34:28  PM Due to the description of the toe right after the injury and the caller being diabetic, upgraded to be seen within 24 hours. Referrals REFERRED TO PCP OFFICE

## 2019-01-19 NOTE — Telephone Encounter (Signed)
SW pt, he stated that last night he was trying to catch his dog when he stumped his toe (left 2nd digit) on the rug. He stated that his toe hurt for a little while but is not really bothering him now. He stated that there is just a little bruising on the top of the toe but no swelling and very little to no pain.   I advised pt that an appt at this time is not really needed but to keep an eye on his toe and if he notices and swelling, redness, increased pain to call and schedule an apt. Pt voiced understanding.   Pt was advised that he should put ice on his toe for 10  min 2-3 times daily and elevate. Pt also advised to buddy tape his toe if needed when walking. Pt voiced understanding.

## 2019-01-19 NOTE — Telephone Encounter (Signed)
FYI

## 2019-01-20 ENCOUNTER — Other Ambulatory Visit: Payer: Self-pay | Admitting: Family Medicine

## 2019-01-23 ENCOUNTER — Other Ambulatory Visit: Payer: Self-pay | Admitting: Family Medicine

## 2019-01-30 DIAGNOSIS — H18612 Keratoconus, stable, left eye: Secondary | ICD-10-CM | POA: Diagnosis not present

## 2019-01-30 DIAGNOSIS — H2511 Age-related nuclear cataract, right eye: Secondary | ICD-10-CM | POA: Diagnosis not present

## 2019-01-30 DIAGNOSIS — Z947 Corneal transplant status: Secondary | ICD-10-CM | POA: Diagnosis not present

## 2019-01-30 DIAGNOSIS — Z961 Presence of intraocular lens: Secondary | ICD-10-CM | POA: Diagnosis not present

## 2019-02-05 ENCOUNTER — Encounter: Payer: Self-pay | Admitting: Neurology

## 2019-02-09 ENCOUNTER — Ambulatory Visit (INDEPENDENT_AMBULATORY_CARE_PROVIDER_SITE_OTHER): Payer: Medicare Other | Admitting: Neurology

## 2019-02-09 ENCOUNTER — Other Ambulatory Visit: Payer: Self-pay

## 2019-02-09 ENCOUNTER — Encounter: Payer: Self-pay | Admitting: Neurology

## 2019-02-09 VITALS — BP 134/69 | HR 85 | Ht 70.0 in | Wt 257.0 lb

## 2019-02-09 DIAGNOSIS — Z9989 Dependence on other enabling machines and devices: Secondary | ICD-10-CM

## 2019-02-09 DIAGNOSIS — G4733 Obstructive sleep apnea (adult) (pediatric): Secondary | ICD-10-CM

## 2019-02-09 NOTE — Progress Notes (Signed)
Subjective:    Patient ID: [name] [name] is a 68 y.o. male.  HPI     Interim history:   [name] [name] is a 68 year old right-handed gentleman with an underlying medical history of hypertension, cardiomegaly, hyperlipidemia, depression, degenerative disc disease, BPH, allergic rhinitis, type 2 diabetes, and obesity, who presents for follow-up consultation of his obstructive sleep apnea , on CPAP therapy. The patient is accompanied by his wife today. I last saw him on 08/11/2018, at which time we talked about his baseline sleep study from 04/20/2018 as well as his CPAP titration study from 05/11/2018. He had adapted well to treatment with his new CPAP machine. He had prior difficulty tolerating CPAP but was doing rather well with his new equipment and mask.   Today, 02/09/2019: I reviewed his CPAP compliance data from 01/07/2019 through 02/05/2019 which is a total of 30 days, during which time he used his CPAP every night with percent used days greater than 4 hours at 97%, indicating excellent compliance with an average usage of 5 hours and 31 minutes, residual AHI at goal at 0.7 per hour, leaked very low with the 95th percentile at 0 L/m on a pressure of 16 cm with EPR of 2. He reports doing well, pressure at 16 is working well for him. We reduced it from 17 last time. He has turned off the ramp time which is better for him. Sometimes he sleeps an hour so without the mask on when he feels like he needs a break. Generally, he is able to sleep very well for the first 3 hours and then he has sleep disruption, has to go to the bathroom more than once for the rest of the night. He likes the cooler temperature and not too much moisture. He has adjusted the settings to his liking. He uses a fullface mask and tries to sleep on his sides. He is working on weight loss, goes to the weight management clinic, wife has been able to lose quite a bit of weight as well. She also has sleep apnea. Because of her  sleep apnea mask leaking, he gets disturbed sometimes, they typically sleep in separate bedrooms.    The patient's allergies, current medications, family history, past medical history, past social history, past surgical history and problem list were reviewed and updated as appropriate.    Previously:    I first met him on 03/29/2018 at the request of his primary care physician, at which time the patient reported a prior diagnosis of obstructive sleep apnea and was complaining of difficulty with concentration his memory as well as daytime somnolence. He was advised to proceed with sleep study testing. He was willing to reconsider CPAP therapy but had previously had difficulty with tolerance including nasal congestion and claustrophobia. He had a baseline sleep study, followed by a CPAP titration study. I went over his test results with him in detail today. Baseline sleep study from 04/20/2018 showed a sleep efficiency of 80.1%, sleep latency of 29 minutes, REM latency delayed at 171 minutes. He had an increased percentage of stage II sleep, slow-wave sleep was 3.3%, REM sleep mildly reduced at 14.4%, total AHI was 14.4 per hour, REM AHI 17.1 per hour, supine AHI was high but supine sleep was minimal. Average oxygen saturation was suboptimal, there were also problems with CO2 sensor, O2 nadir was 79%, he had severe PLMS with moderate arousals. He was advised to proceed with a CPAP titration study, which he had on 05/11/2018, sleep efficiency was 75.1%,  sleep latency 31 minutes, REM sleep was absent. He was titrated from CPAP of 5 cm to 17 cm and requested a full facemask. On the final pressure his AHI was 0 per hour with supine non-REM sleep achieved and O2 nadir of 93%. He had severe PLMS with moderate arousals. Based on his test results I prescribed CPAP therapy for home use at a pressure of 17 cm.    I reviewed his CPAP compliance data from 07/11/2018 through 08/09/2018 which is a total of 30 days, during  which time he used his CPAP every night with percent used days greater than 4 hours at 100%, indicating superb compliance with an average usage of 5 hours and 53 minutes, residual AHI at goal at 1.5 per hour, leak acceptable on the low side with the 95th percentile at 1 L/m on a pressure of 17 cm with EPR of 2.  03/29/2018: (He) was previously diagnosed with obstructive sleep apnea. Prior sleep study results are not available for my review today. He reports snoring and excessive daytime somnolence, difficulty with concentration and memory. I reviewed your office note from 02/04/2018. He has been followed by medical weight management for his obesity. He estimates that his sleep study was about 15 or 20 years ago even, he was given a CPAP machine, he tried it but could not tolerate it due to claustrophobia had issues with nasal congestion. Of note, he had nasal surgery in the recent past, inferior turbinate reduction. He suspects that he has allergies but was tested recently for allergies and did not have any significant allergens. He would be willing to try CPAP again and be tested for sleep apnea. He had some memory issues. His wife successfully uses a CPAP machine and has benefited from it which encouraged him. He snores, he has breathing pauses while asleep less so since he has started avoiding sleep on his back. He had back surgery as well.  His Epworth sleepiness score is 12 out of 24 today, fatigue score is 55 out of 63. He is married and lives with his wife. He does not smoke, drinks alcohol rarely, drinks caffeine in the form of tea, 2 cups per day on average. He has had a long-standing history of restless leg syndrome, I does not very bothersome and certainly fluctuates in degree. Sometimes it affects only one leg, sometimes both, he does move in his sleep per wife's report and he has vivid dreams. He has had occasional calling out of dreams.  His Past Medical History Is Significant For: Past Medical  History:  Diagnosis Date  . Allergic rhinitis, unspecified    Allergy testing via allergist 04/2017--mostly neg.  Dr. Donneta Romberg recommended referral to ENT so i did this.  . Back pain   . BPH (benign prostatic hypertrophy)   . Chronic nasal congestion   . DDD (degenerative disc disease), lumbar 2010   laminectomy 01/2009  . Depression    Hospitalized for suicidal ideation 40.  Has been on lexapro since 10/2008.  . Diabetes mellitus type II    Dx'd approximately 06/2008.  No retinopathy as of 02/2017 ophth.  . Erectile dysfunction   . Erectile dysfunction   . History of colon polyps 01/2016   Recall 3 yrs  . History of pneumonia 2008   Hospitalized  . History of scarlet fever    age 7  . Hyperlipidemia, mixed 1993   myalgias on pravastatin  . Hypertension 2009   "marked chronic cardiomegaly" on CXR 01/2009 per old  records.  . 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 Insomnia   . Keratoconus    dx'd 1973  . Memory changes   . OSA on CPAP 2004; 2019   Back on CPAP 04/2018  . Osteoarthritis of both knees    Mild plain film changes in medial compartment bilat  . Restless leg   . Rhinitis, chronic   . 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 APPENDECTOMY  1972  . COLONOSCOPY  X 4   Most recent was 2007 [facility], [place]), with removal of polyps in the first two.  02/21/16: tubular adenoma.  Recall 3 yrs (Dr. Ardis Hughs).  . 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 ELECTROCARDIOGRAM  01/29/2009   NORMAL  . LUMBAR LAMINECTOMY  01/2009  . TONSILLECTOMY AND ADENOIDECTOMY     age 61  . URETHRAL DILATION     2 as a child, one as an adult  . VASECTOMY  1994  . VITRECTOMY      His Family History Is Significant For: Family History  Problem Relation Age of Onset  . Heart disease Mother   . Hyperlipidemia Mother   . Stroke Mother    . Diabetes Mother   . Depression Mother   . Alzheimer's disease Mother   . Stroke Father   . Hyperlipidemia Father   . Heart disease Father   . Diabetes Father   . Hypertension Father   . Obesity Father   . Alcohol abuse Sister   . Depression Sister     His Social History Is Significant For: Social History   Socioeconomic History  . Marital status: Married    Spouse name: Mickey Esguerra  . Number of children: Not on file  . Years of education: Not on file  . Highest education level: Not on file  Occupational History  . Occupation: Retired  Scientific laboratory technician  . Financial resource strain: Not on file  . Food insecurity:    Worry: Not on file    Inability: Not on file  . Transportation needs:    Medical: Not on file    Non-medical: Not on file  Tobacco Use  . Smoking status: Former Research scientist (life sciences)  . Smokeless tobacco: Never Used  Substance and Sexual Activity  . Alcohol use: Yes    Comment: social, 3-4 drinks month  . Drug use: No  . Sexual activity: Yes    Partners: Female  Lifestyle  . Physical activity:    Days per week: Not on file    Minutes per session: Not on file  . Stress: Not on file  Relationships  . Social connections:    Talks on phone: Not on file    Gets together: Not on file    Attends religious service: Not on file    Active member of club or organization: Not on file    Attends meetings of clubs or organizations: Not on file    Relationship status: Not on file  Other Topics Concern  . Not on file  Social History Narrative   Divorced, then remarried.  Has 3 sons, no grandchildren.   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   No tobacco.  Rare ETOH.  No drug abuse.  Enjoys reading and spending time with his 2 dogs.    His Allergies Are:  Allergies  Allergen Reactions  . Simvastatin Other (See Comments)    myalgias  . Antihistamines, Diphenhydramine-Type Other (See Comments)     depression  . Codeine Nausea Only  . Penicillins Hives  . Sulfa Antibiotics Hives  :   His Current Medications Are:  Outpatient Encounter Medications as of 02/09/2019  Medication Sig  . albuterol (PROVENTIL HFA;VENTOLIN HFA) 108 (90 Base) MCG/ACT inhaler Inhale into the lungs every 6 (six) hours as needed for wheezing or shortness of breath.  [name] aspirin 325 MG tablet Take 325 mg by mouth daily.    [name] atorvastatin (LIPITOR) 80 MG tablet TAKE 1 TABLET BY MOUTH ONCE DAILY.  [name] Choline Fenofibrate (FENOFIBRIC ACID) 135 MG CPDR TAKE 1 CAPSULE BY MOUTH ONCE DAILY  . escitalopram (LEXAPRO) 20 MG tablet TAKE 1 TABLET(20 MG) BY MOUTH DAILY  . finasteride (PROSCAR) 5 MG tablet TAKE 1 TABLET BY MOUTH ONCE DAILY  . fluticasone (FLONASE) 50 MCG/ACT nasal spray Place into both nostrils daily.  . insulin detemir (LEVEMIR) 100 UNIT/ML injection Inject 56 Units into the skin daily.  . Insulin Pen Needle (BD PEN NEEDLE NANO U/F) 32G X 4 MM MISC USE FOR INSULIN INJECTION FOUR TIMES A DAY  . liraglutide (VICTOZA) 18 MG/3ML SOPN Inject 0.3 mLs (1.8 mg total) into the skin every morning.  [name] lisinopril (PRINIVIL,ZESTRIL) 5 MG tablet Take 1 tablet (5 mg total) by mouth daily.  [name] MELATONIN PO Take by mouth.  . metFORMIN (GLUCOPHAGE) 500 MG tablet TAKE 2 TABLETS BY MOUTH TWICE A DAY WITH FOOD  . Naproxen Sodium (ALEVE) 220 MG CAPS Take 2 capsules by mouth at bedtime as needed.    . Omega-3 Fatty Acids (FISH OIL) 1000 MG CAPS Take 1 capsule by mouth 2 (two) times daily.  . ONE TOUCH ULTRA TEST test strip TEST THREE TIMES DAILY  . SALINE NASAL SPRAY NA Place into the nose daily as needed.  . tamsulosin (FLOMAX) 0.4 MG CAPS capsule TAKE 2 CAPSULES BY MOUTH AT BEDTIME  . vitamin B-12 (CYANOCOBALAMIN) 1000 MCG tablet Take 1,000 mcg by mouth daily.    . [DISCONTINUED] LEVEMIR FLEXTOUCH 100 UNIT/ML Pen INJECT 60 UNITS UNDER THE SKIN DAILY EVERY NIGHT AT BEDTIME AT 10:00 PM (Patient taking differently: 56 Units. )   No  facility-administered encounter medications on file as of 02/09/2019.   :  Review of Systems:  Out of a complete 14 point review of systems, all are reviewed and negative with the exception of these symptoms as listed below: Review of Systems  Neurological:       Pt presents today to discuss his cpap. Pt reports that his cpap is going well.    Objective:  Neurological Exam  Physical Exam Physical Examination:   Vitals:   02/09/19 1401  BP: 134/69  Pulse: 85   General Examination: The patient is a very pleasant 68 y.o. male in no acute distress. He appears well-developed and well-nourished and well groomed.   HEENT:Normocephalic, atraumatic, pupils are equal, round and reactive to light, extraocular tracking is good without limitation to gaze excursion or nystagmus noted. Normal smooth pursuit is noted. Hearing is grossly intact. Face is symmetric with normal facial animation and normal facial sensation. Speech is clear with no dysarthria noted. There is no hypophonia. There is no lip, neck/head, jaw or voice tremor. Neck shows FROM. Oropharynx exam reveals: mildmouth dryness, adequatedental hygiene and moderateairway crowding.  Chest:Clear to auscultation without wheezing, rhonchi or crackles noted.  Heart:S1+S2+0, regular and normal without murmurs, rubs or gallops noted.   Abdomen:Soft, non-tender and non-distended.  Extremities:There istracepitting edema in the distal lower extremities bilaterally.   Skin: Warm and dry without trophic changes noted.  Musculoskeletal: exam reveals no obvious joint deformities, tenderness or joint swelling or erythema.   Neurologically:  Mental status: The patient is awake, alert and oriented in all 4 spheres.Hisimmediate and remote memory, attention, language skills and fund of knowledge are appropriate. There is no evidence of aphasia, agnosia, apraxia or anomia. Speech is clear with normal prosody and enunciation. Thought  process is linear. Mood is normaland affect is normal.  Cranial nerves II - XII are as described above under HEENT exam.  Motor exam: Normal bulk, strength and tone is noted. There is no drift, tremor or rebound. Fine motor skills and coordination: grossly intact.  Cerebellar testing: No dysmetria or intention tremor. There is no truncal or gait ataxia.  Sensory exam: intact to light touchin the upper and lower extremities.  Gait, station and balance:Hestands easily. No veering to one side is noted. No leaning to one side is noted. Posture is age-appropriate and stance is narrow based. Gait showsnormalstride length and normalpace. No problems turning are noted.  Assessmentand Plan:  In summary,[name] Kwiatkowskiis a very pleasant 67 year oldmalewith an underlying medical history of hypertension, cardiomegaly, hyperlipidemia, depression, degenerative disc disease, BPH, allergic rhinitis, type 2 diabetes, and obesity, who presents for follow-up consultation of his obstructive sleep apnea, well established on CPAP therapy at a pressure of 16 cm via full facemask with excellent compliance and very good tolerance reported. He has done rather well. He is commended for his treatment adherence. We reviewed his most recent compliance data together. Of note, he had a baseline sleep study in May 2019, followed by a CPAP titration study in June 2019. May reduce the pressure in September 2019 from 17 cm to 16 cm. We can address restless leg symptoms and PLMS if needed in the future. Currently he feels stable. I Asked him to follow-up routinely in one year, he can see one of our nurse practitioners. I answered all their questions today and the patient and his wife were in agreement. I spent 20 minutes in total face-to-face time with the patient, more than 50% of which was spent in counseling and coordination of care, reviewing test results, reviewing medication and discussing or reviewing the diagnosis of  POSA, its prognosis and treatment options. Pertinent laboratory and imaging test results that were available during this visit with the patient were reviewed by me and considered in my medical decision making (see chart for details).

## 2019-02-09 NOTE — Patient Instructions (Signed)

## 2019-02-13 ENCOUNTER — Ambulatory Visit (INDEPENDENT_AMBULATORY_CARE_PROVIDER_SITE_OTHER): Payer: Medicare Other | Admitting: Family Medicine

## 2019-02-13 ENCOUNTER — Other Ambulatory Visit: Payer: Self-pay

## 2019-02-13 ENCOUNTER — Encounter (INDEPENDENT_AMBULATORY_CARE_PROVIDER_SITE_OTHER): Payer: Self-pay

## 2019-02-13 ENCOUNTER — Ambulatory Visit (INDEPENDENT_AMBULATORY_CARE_PROVIDER_SITE_OTHER): Payer: Medicare Other

## 2019-02-13 ENCOUNTER — Encounter: Payer: Self-pay | Admitting: Family Medicine

## 2019-02-13 VITALS — BP 116/64 | HR 76 | Temp 98.4°F | Resp 16 | Ht 70.0 in | Wt 254.8 lb

## 2019-02-13 DIAGNOSIS — M4727 Other spondylosis with radiculopathy, lumbosacral region: Secondary | ICD-10-CM

## 2019-02-13 DIAGNOSIS — E118 Type 2 diabetes mellitus with unspecified complications: Secondary | ICD-10-CM

## 2019-02-13 DIAGNOSIS — M533 Sacrococcygeal disorders, not elsewhere classified: Secondary | ICD-10-CM | POA: Diagnosis not present

## 2019-02-13 DIAGNOSIS — M4800 Spinal stenosis, site unspecified: Secondary | ICD-10-CM

## 2019-02-13 DIAGNOSIS — E782 Mixed hyperlipidemia: Secondary | ICD-10-CM | POA: Diagnosis not present

## 2019-02-13 DIAGNOSIS — I10 Essential (primary) hypertension: Secondary | ICD-10-CM

## 2019-02-13 DIAGNOSIS — M4807 Spinal stenosis, lumbosacral region: Secondary | ICD-10-CM | POA: Diagnosis not present

## 2019-02-13 IMAGING — DX LUMBAR SPINE - COMPLETE 4+ VIEW
4 series · 4 of 4 positions shown · non-contrast
Comparison: None.

CLINICAL DATA: 68-year-old male low back pain for several months.
Initial encounter.

EXAM:
LUMBAR SPINE - COMPLETE 4+ VIEW

[lumbar spine ap]
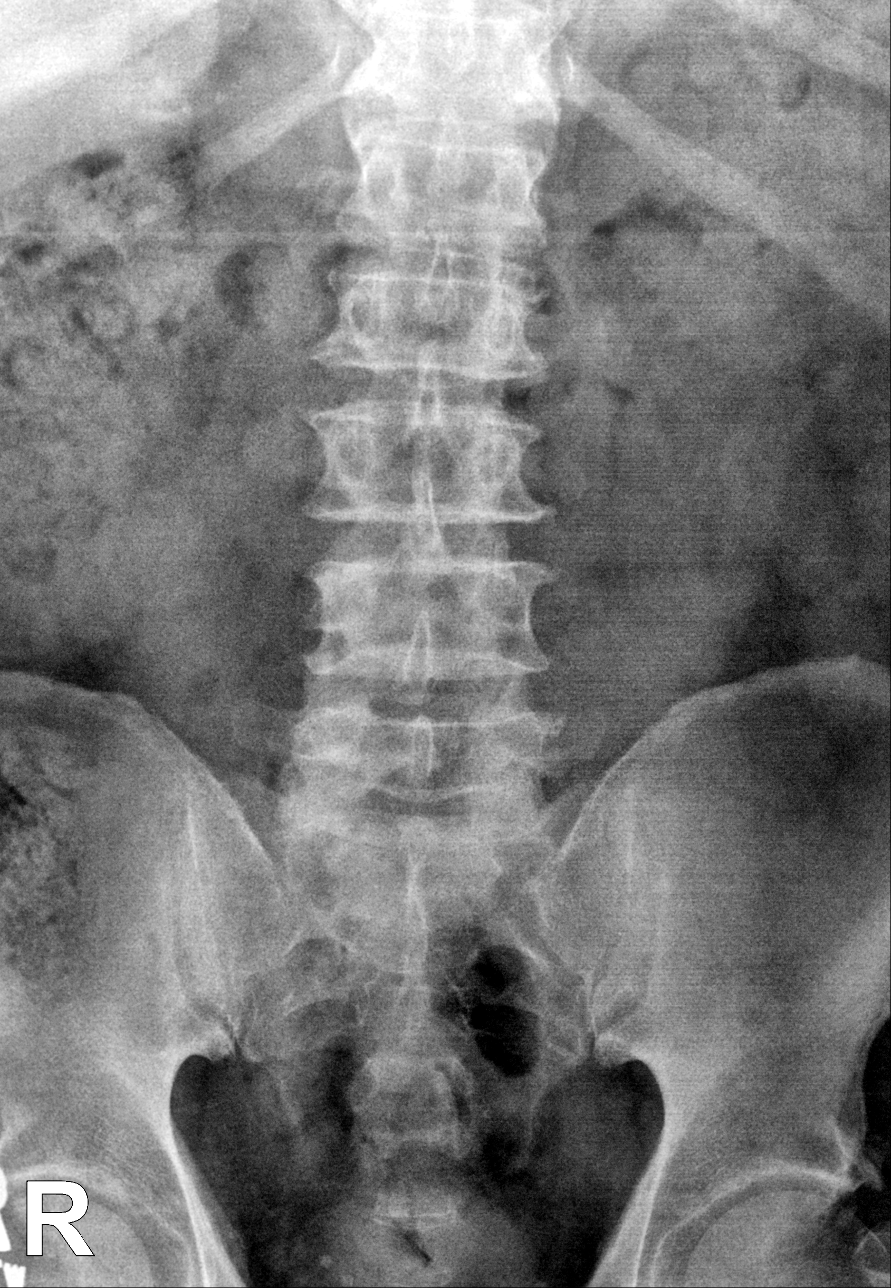

[lumbar spine oblique (1 of 2)]
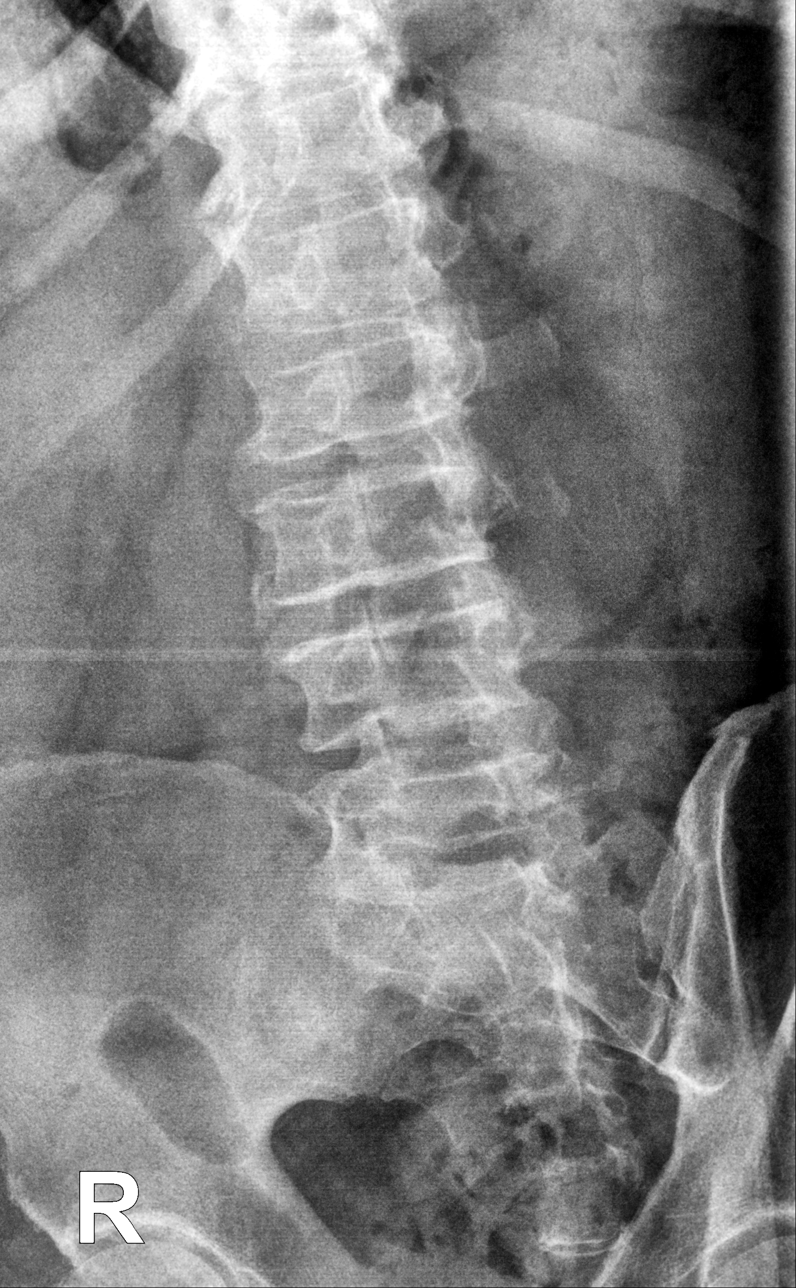

[lumbar spine oblique (2 of 2)]
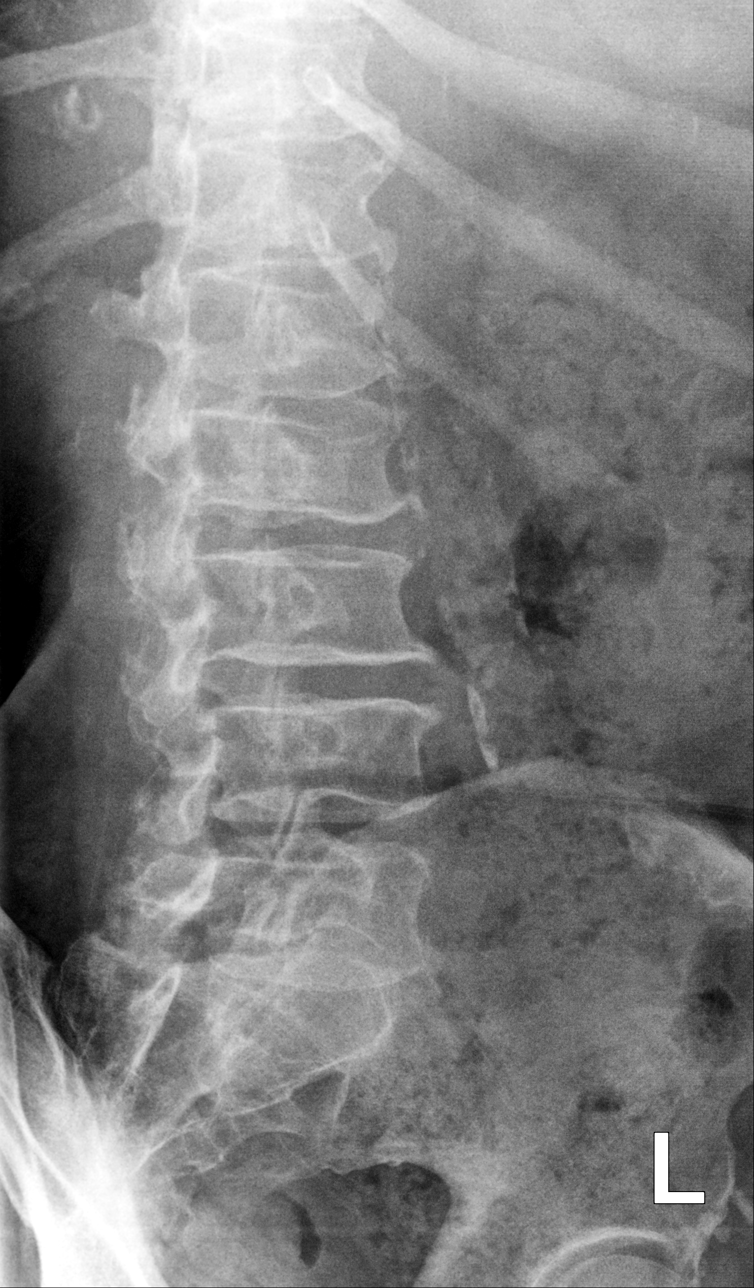

[lumbar spine lat]
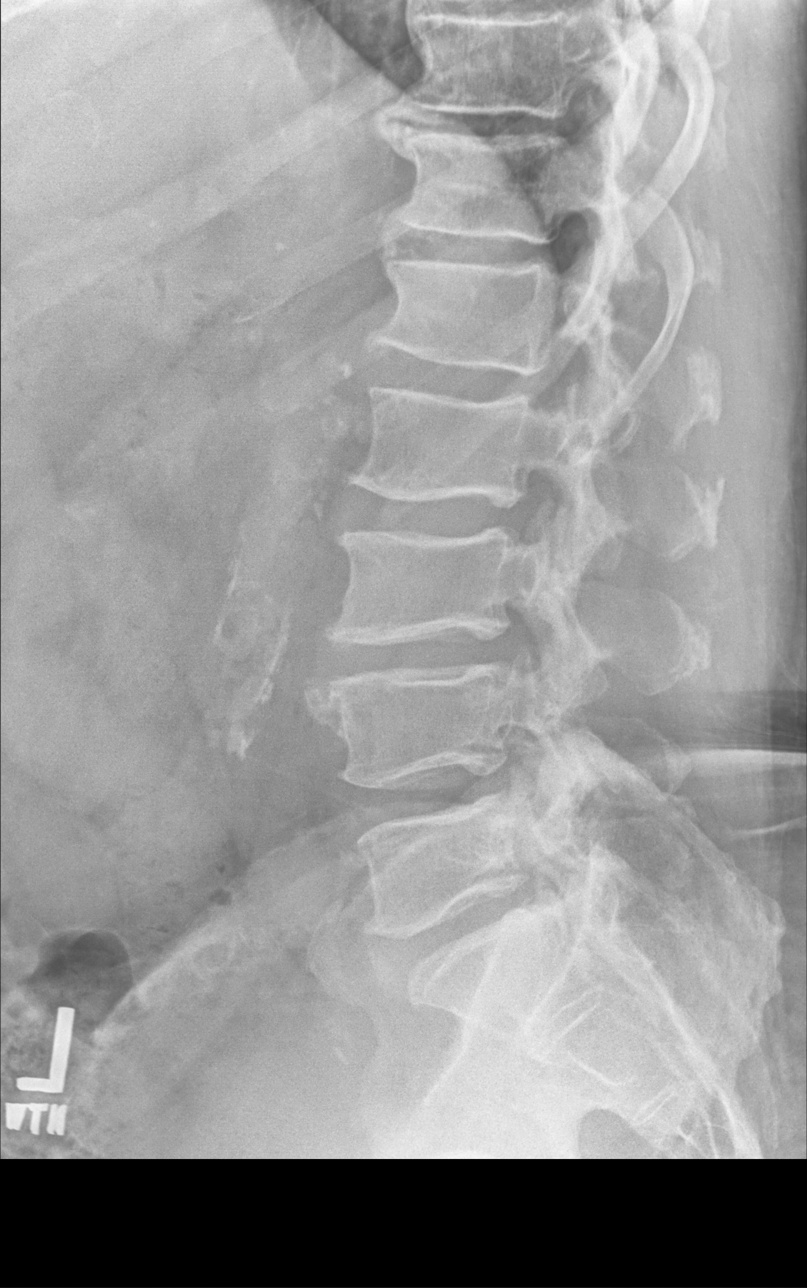

[4 of 4 positions shown; findings below may reference images not displayed]

FINDINGS: Normal alignment lumbar spine without compression fracture or pars
defect.

Schmorl's node deformity with mild narrowing of the posterior aspect
of the disc space L2-3 through L5-S1.

T11-12 mild disc space narrowing with anterior osteophyte.

Anterior osteophyte T12-L1 and L1-2.

Vascular calcifications.

Sacroiliac joints appear to be grossly intact.
IMPRESSION: 1. Schmorl's node deformity with mild narrowing of the posterior
aspect of the disc space L2-3 through L5-S1.
2. Mild narrowing T11-12 disc space with anterior osteophyte.
3. Aortic Atherosclerosis ([03]-[03]). Branch vessel
calcifications.

## 2019-02-13 MED ORDER — INSULIN DETEMIR 100 UNIT/ML ~~LOC~~ SOLN: 56.0000 [IU] | Freq: Every day | SUBCUTANEOUS | 6 refills | Status: DC

## 2019-02-13 MED ORDER — METFORMIN HCL 1000 MG PO TABS: 1000.0000 mg | ORAL_TABLET | Freq: Two times a day (BID) | ORAL | 1 refills | Status: DC

## 2019-02-13 MED ORDER — ATORVASTATIN CALCIUM 80 MG PO TABS: 80.0000 mg | ORAL_TABLET | Freq: Every day | ORAL | 1 refills | Status: DC

## 2019-02-13 MED ORDER — TAMSULOSIN HCL 0.4 MG PO CAPS: 0.8000 mg | ORAL_CAPSULE | Freq: Every day | ORAL | 1 refills | Status: DC

## 2019-02-13 MED ORDER — FENOFIBRIC ACID 135 MG PO CPDR: 1.0000 | DELAYED_RELEASE_CAPSULE | Freq: Every day | ORAL | 1 refills | Status: DC

## 2019-02-13 MED ORDER — FLUTICASONE PROPIONATE 50 MCG/ACT NA SUSP: 2.0000 | Freq: Every day | NASAL | 1 refills | Status: DC

## 2019-02-13 MED ORDER — LISINOPRIL 5 MG PO TABS: 5.0000 mg | ORAL_TABLET | Freq: Every day | ORAL | 1 refills | Status: DC

## 2019-02-13 NOTE — Progress Notes (Signed)
OFFICE VISIT  02/13/2019   CC:  Chief Complaint  Patient presents with  . Follow-up    RCI, pt is not fasting.   HPI:    Patient is a 68 y.o. Caucasian male who presents for 3 mo f/u DM 2, mixed hyperlipidemia, and HTN.  DM: his fasting norm is about 115.   Taking 56 U levemir, victoza daily, metformin 1000 mg bid.  HTN: no home bp checks.  At bariatric clinic his bp is checked and it is normal.  HLD: taking atorva and fenofib daily w/out problems.  Working on dietary and exercise habits under the monitoring of bariatric clinic.   His only exercise is walking.  He has continued pain in low back and both hamstrings that affect is walking.  Trying to do some more stretching at home.  The hamstring pain starts after only 1-2 steps. Denies LE weakness per se.  No saddle anesthesia.  No radicular pain or paresthesias.  No calf pain with walking. Leaning over on counter or grocery cart alleviates the discomfort some. Massages no help.  ROS: no CP, no SOB, no wheezing, no cough, no dizziness, no HAs, no rashes, no melena/hematochezia.  No polyuria or polydipsia.  No swollen joints.   Past Medical History:  Diagnosis Date  . Allergic rhinitis, unspecified    Allergy testing via allergist 04/2017--mostly neg.  Dr. Donneta Romberg recommended referral to ENT so i did this.  . Back pain   . BPH (benign prostatic hypertrophy)   . Chronic nasal congestion   . DDD (degenerative disc disease), lumbar 2010   laminectomy 01/2009  . Depression    Hospitalized for suicidal ideation 67.  Has been on lexapro since 10/2008.  . Diabetes mellitus type II    Dx'd approximately 06/2008.  No retinopathy as of 02/2017 ophth.  . Erectile dysfunction   . Erectile dysfunction   . History of colon polyps 01/2016   Recall 3 yrs  . History of pneumonia 2008   Hospitalized  . History of scarlet fever    age 75  . Hyperlipidemia, mixed 1993   myalgias on pravastatin  . Hypertension 2009   "marked chronic  cardiomegaly" on CXR 01/2009 per old records.  . 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 Insomnia   . Keratoconus    dx'd 1973  . Memory changes   . OSA on CPAP 2004; 2019   Back on CPAP 04/2018  . Osteoarthritis of both knees    Mild plain film changes in medial compartment bilat  . Restless leg   . Rhinitis, chronic   . 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 APPENDECTOMY  1972  . COLONOSCOPY  X 4   Most recent was 2007 [place], [place]), with removal of polyps in the first two.  02/21/16: tubular adenoma.  Recall 3 yrs (Dr. Ardis Hughs).  . 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 ELECTROCARDIOGRAM  01/29/2009   NORMAL  . LUMBAR LAMINECTOMY  01/2009  . TONSILLECTOMY AND ADENOIDECTOMY     age 23  . URETHRAL DILATION     2 as a child, one as an adult  . VASECTOMY  1994  . VITRECTOMY      Outpatient Medications Prior to Visit  Medication Sig Dispense Refill  . aspirin 325 MG tablet Take 325 mg by mouth  daily.      . escitalopram (LEXAPRO) 20 MG tablet TAKE 1 TABLET(20 MG) BY MOUTH DAILY 30 tablet 0  . finasteride (PROSCAR) 5 MG tablet TAKE 1 TABLET BY MOUTH ONCE DAILY 90 tablet 1  . Insulin Pen Needle (BD PEN NEEDLE NANO U/F) 32G X 4 MM MISC USE FOR INSULIN INJECTION FOUR TIMES A DAY 100 each 12  . liraglutide (VICTOZA) 18 MG/3ML SOPN Inject 0.3 mLs (1.8 mg total) into the skin every morning. 3 pen 0  . MELATONIN PO Take by mouth.    . Naproxen Sodium (ALEVE) 220 MG CAPS Take 2 capsules by mouth at bedtime as needed.      . Omega-3 Fatty Acids (FISH OIL) 1000 MG CAPS Take 1 capsule by mouth 2 (two) times daily.    . ONE TOUCH ULTRA TEST test strip TEST THREE TIMES DAILY 100 each 11  . SALINE NASAL SPRAY NA Place into the nose daily as needed.    . vitamin B-12 (CYANOCOBALAMIN) 1000 MCG tablet Take 1,000 mcg by mouth daily.      [name] atorvastatin (LIPITOR) 80 MG tablet  TAKE 1 TABLET BY MOUTH ONCE DAILY. 90 tablet 1  . Choline Fenofibrate (FENOFIBRIC ACID) 135 MG CPDR TAKE 1 CAPSULE BY MOUTH ONCE DAILY 30 capsule 0  . fluticasone (FLONASE) 50 MCG/ACT nasal spray Place into both nostrils daily.    . insulin detemir (LEVEMIR) 100 UNIT/ML injection Inject 56 Units into the skin daily.    [name] lisinopril (PRINIVIL,ZESTRIL) 5 MG tablet Take 1 tablet (5 mg total) by mouth daily. 30 tablet 3  . metFORMIN (GLUCOPHAGE) 500 MG tablet TAKE 2 TABLETS BY MOUTH TWICE A DAY WITH FOOD 360 tablet 1  . tamsulosin (FLOMAX) 0.4 MG CAPS capsule TAKE 2 CAPSULES BY MOUTH AT BEDTIME 180 capsule 1  . albuterol (PROVENTIL HFA;VENTOLIN HFA) 108 (90 Base) MCG/ACT inhaler Inhale into the lungs every 6 (six) hours as needed for wheezing or shortness of breath.     No facility-administered medications prior to visit.     Allergies  Allergen Reactions  . Simvastatin Other (See Comments)    myalgias  . Antihistamines, Diphenhydramine-Type Other (See Comments)    depression  . Codeine Nausea Only  . Penicillins Hives  . Sulfa Antibiotics Hives    ROS As per HPI  PE: Blood pressure 116/64, pulse 76, temperature 98.4 F (36.9 C), temperature source Oral, resp. rate 16, height 5\' 10"  (1.778 m), weight 254 lb 12.8 oz (115.6 kg), SpO2 95 %. Gen: Alert, well appearing.  Patient is oriented to person, place, time, and situation. AFFECT: pleasant, lucid thought and speech. Moves slowly out of chair and onto exam table. ROM of back quite limited in all motions. He has some tenderness of SI joints bilat but otherwise back is nontender.  Sitting SLR neg bilat.  Hips ROM w/out pain but a bit limited due to tight muscles.  No greater troch tenderness on either side.  No tenderness of hamstrings. LE strength 5/5 prox and dist bilat.   FABER's elicits pain on ipsilateral side when performed on each leg. Patellar DTRs 1+ bilat.  No achilles DTR on either side. He walks a bit stooped over, with  short steps and mildly wide stance with diminished knee extension with each step. Foot exam - bilateral normal; no swelling, tenderness or skin or vascular lesions. Color and temperature is normal. Sensation is intact. Peripheral pulses are palpable. Toenails are normal.   LABS:  Lab Results  Component  Value Date   TSH 2.030 02/14/2018   Lab Results  Component Value Date   WBC 7.2 02/14/2018   HGB 13.4 02/14/2018   HCT 39.3 02/14/2018   MCV 85 02/14/2018   PLT 211.0 05/21/2014   Lab Results  Component Value Date   CREATININE 0.97 08/10/2018   BUN 25 (H) 08/10/2018   NA 137 08/10/2018   K 4.2 08/10/2018   CL 103 08/10/2018   CO2 26 08/10/2018   Lab Results  Component Value Date   ALT 23 02/14/2018   AST 16 02/14/2018   ALKPHOS 35 (L) 02/14/2018   BILITOT 0.4 02/14/2018   Lab Results  Component Value Date   CHOL 117 08/10/2018   Lab Results  Component Value Date   HDL 44.60 08/10/2018   Lab Results  Component Value Date   LDLCALC 55 08/10/2018   Lab Results  Component Value Date   TRIG 91.0 08/10/2018   Lab Results  Component Value Date   CHOLHDL 3 08/10/2018   Lab Results  Component Value Date   PSA 0.04 (L) 05/09/2018   PSA 0.03 (L) 07/04/2015   PSA 0.02 (L) 05/21/2014   Lab Results  Component Value Date   HGBA1C 5.8 (A) 11/14/2018    IMPRESSION AND PLAN:  1) DM 2:  He'll be getting labs done at his wt loss clinic tomorrow, so I did not get BMET or HbA1c today. Feet exam normal today. Reminded pt of past due for diab retpthy screening. He needs a final pneumovax 23 but due to discussing his back/hamstrings so much today we did not get to this topic.  2) HTN: The current medical regimen is effective;  continue present plan and medications. Lytes/cr tomorrow at wt loss clinic.  3) HLD: tolerating high dose lipitor as well as fenofibrate. Excellent lipid panel 07/2018.  Plan repeat 6 months.  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  He does have a history of lumbar laminectomy back in 2010, and he could have now developed some spinal stenosis that is causing his current pain. Will check plain films of L/S spine to eval for SI abnormalities/sacroiliitis.   Will refer to Dr. Vertell Limber in neurosurgery for further evaluation.  5) Preventative health: he is due for repeat colonoscopy with Dr. Rennis Golden pt of this today.  He should be getting a recall letter from GI.  An After Visit Summary was printed and given to the patient.  FOLLOW UP: Return in about 3 months (around 05/16/2019) for routine chronic illness f/u (30 min).  Signed:  Crissie Sickles, MD           02/13/2019

## 2019-02-14 ENCOUNTER — Ambulatory Visit (INDEPENDENT_AMBULATORY_CARE_PROVIDER_SITE_OTHER): Payer: Medicare Other | Admitting: Physician Assistant

## 2019-02-14 ENCOUNTER — Encounter (INDEPENDENT_AMBULATORY_CARE_PROVIDER_SITE_OTHER): Payer: Self-pay | Admitting: Physician Assistant

## 2019-02-14 ENCOUNTER — Other Ambulatory Visit: Payer: Self-pay

## 2019-02-14 VITALS — BP 107/65 | HR 77 | Temp 97.9°F | Ht 70.0 in | Wt 247.0 lb

## 2019-02-14 DIAGNOSIS — E559 Vitamin D deficiency, unspecified: Secondary | ICD-10-CM

## 2019-02-14 DIAGNOSIS — Z794 Long term (current) use of insulin: Secondary | ICD-10-CM

## 2019-02-14 DIAGNOSIS — E7849 Other hyperlipidemia: Secondary | ICD-10-CM

## 2019-02-14 DIAGNOSIS — E119 Type 2 diabetes mellitus without complications: Secondary | ICD-10-CM | POA: Diagnosis not present

## 2019-02-14 DIAGNOSIS — Z6836 Body mass index (BMI) 36.0-36.9, adult: Secondary | ICD-10-CM

## 2019-02-14 MED ORDER — LIRAGLUTIDE 18 MG/3ML ~~LOC~~ SOPN: 1.8000 mg | PEN_INJECTOR | Freq: Every morning | SUBCUTANEOUS | 0 refills | Status: DC

## 2019-02-14 NOTE — Progress Notes (Signed)
Office: [phone]  /  Fax: [phone]   HPI:   Chief Complaint: OBESITY [name] [name] is here to discuss his progress with his obesity treatment plan. He is on the Category 4 plan and is following his eating plan approximately 75% of the time. He states he is exercising 0 minutes 0 times per week. [name] [name] did well with weight loss. He reports being slightly bored with dinner. He states that his back pain has increased and he is seeing his neurosurgeon in the next week. His weight is 247 lb (112 kg) today and has had a weight loss of 2 pounds over a period of 4 weeks since his last visit. He has lost 25 lbs since starting treatment with [name].  Hyperlipidemia [name] [name] has hyperlipidemia and is on atorvastatin. He has been trying to improve his cholesterol levels with intensive lifestyle modification including a low saturated fat diet, exercise and weight loss. He denies any chest pain.  Diabetes II [name] [name] has a diagnosis of diabetes type II and is currently on Levemir, Victoza, and metformin. [name] [name] states fasting blood sugars range between 105 and 125. He denies any hypoglycemic episodes. Last A1c was 5.8 on 11/14/2018. He has been working on intensive lifestyle modifications including diet, exercise, and weight loss to help control his blood glucose levels. He denies nausea, vomiting, or diarrhea.  Vitamin D deficiency [name] [name] has a diagnosis of Vitamin D deficiency and is currently on no medication.  ASSESSMENT AND PLAN:  Other hyperlipidemia - Plan: Lipid Panel With LDL/HDL Ratio  Type 2 diabetes mellitus without complication, with long-term current use of insulin ([name] [name]) - Plan: Comprehensive metabolic panel, Hemoglobin A1c, liraglutide (VICTOZA) 18 MG/3ML SOPN  Vitamin D deficiency - Plan: VITAMIN D 25 Hydroxy (Vit-D Deficiency, Fractures)  Class 2 severe obesity with serious comorbidity and body mass index (BMI) of 36.0 to 36.9 in adult, unspecified obesity type ([place])  PLAN:   Hyperlipidemia [name] [name] was informed of the American Heart Association Guidelines emphasizing intensive lifestyle modifications as the first line treatment for hyperlipidemia. We discussed many lifestyle modifications today in depth, and [name] [name] will continue to work on decreasing saturated fats such as fatty red meat, butter and many fried foods. He will continue his medication, increase vegetables and lean protein in his diet, and continue to work on exercise and weight loss efforts.  Diabetes II [name] [name] has been given extensive diabetes education by myself today including ideal fasting and post-prandial blood glucose readings, individual ideal HgA1c goals  and hypoglycemia prevention. We discussed the importance of good blood sugar control to decrease the likelihood of diabetic complications such as nephropathy, neuropathy, limb loss, blindness, coronary artery disease, and death. We discussed the importance of intensive lifestyle modification including diet, exercise and weight loss as the first line treatment for diabetes. [name] [name] will continue his weight loss efforts and was given a refill on Victoza #3 pens and agrees to follow-up with our clinic in 4 weeks.  Vitamin D Deficiency [name] [name] was informed that low Vitamin D levels contributes to fatigue and are associated with obesity, breast, and colon cancer. He is currently on no medication and will have labs drawn today. [name] [name] agrees to follow-up with our clinic in 2 weeks.  Obesity [name] [name] is currently in the action stage of change. As such, his goal is to continue with weight loss efforts. He has agreed to follow the Category 4 plan and journaling 550-700 calories + 45 grams of protein. [name] [name] has been instructed to work up to a goal of  150 minutes of combined cardio and strengthening exercise per week for weight loss and overall health benefits. We discussed the following Behavioral Modification Strategies today: work on meal planning and easy  cooking plans.  [name] [name] has agreed to follow-up with our clinic in 4 weeks. He was informed of the importance of frequent follow-up visits to maximize his success with intensive lifestyle modifications for his multiple health conditions.  ALLERGIES: Allergies  Allergen Reactions  . Simvastatin Other (See Comments)    myalgias  . Antihistamines, Diphenhydramine-Type Other (See Comments)    depression  . Codeine Nausea Only  . Penicillins Hives  . Sulfa Antibiotics Hives    MEDICATIONS: Current Outpatient Medications on File Prior to Visit  Medication Sig Dispense Refill  . aspirin 325 MG tablet Take 325 mg by mouth daily.      [name] atorvastatin (LIPITOR) 80 MG tablet Take 1 tablet (80 mg total) by mouth daily. 90 tablet 1  . Choline Fenofibrate (FENOFIBRIC ACID) 135 MG CPDR Take 1 capsule by mouth daily. 90 capsule 1  . escitalopram (LEXAPRO) 20 MG tablet TAKE 1 TABLET(20 MG) BY MOUTH DAILY 30 tablet 0  . finasteride (PROSCAR) 5 MG tablet TAKE 1 TABLET BY MOUTH ONCE DAILY 90 tablet 1  . fluticasone (FLONASE) 50 MCG/ACT nasal spray Place 2 sprays into both nostrils daily. 48 g 1  . insulin detemir (LEVEMIR) 100 UNIT/ML injection Inject 0.56 mLs (56 Units total) into the skin daily. 10 mL 6  . Insulin Pen Needle (BD PEN NEEDLE NANO U/F) 32G X 4 MM MISC USE FOR INSULIN INJECTION FOUR TIMES A DAY 100 each 12  . liraglutide (VICTOZA) 18 MG/3ML SOPN Inject 0.3 mLs (1.8 mg total) into the skin every morning. 3 pen 0  . lisinopril (PRINIVIL,ZESTRIL) 5 MG tablet Take 1 tablet (5 mg total) by mouth daily. 90 tablet 1  . MELATONIN PO Take by mouth.    . metFORMIN (GLUCOPHAGE) 1000 MG tablet Take 1 tablet (1,000 mg total) by mouth 2 (two) times daily with a meal. 180 tablet 1  . Naproxen Sodium (ALEVE) 220 MG CAPS Take 2 capsules by mouth at bedtime as needed.      . Omega-3 Fatty Acids (FISH OIL) 1000 MG CAPS Take 1 capsule by mouth 2 (two) times daily.    . ONE TOUCH ULTRA TEST test strip TEST  THREE TIMES DAILY 100 each 11  . SALINE NASAL SPRAY NA Place into the nose daily as needed.    . tamsulosin (FLOMAX) 0.4 MG CAPS capsule Take 2 capsules (0.8 mg total) by mouth at bedtime. 180 capsule 1  . vitamin B-12 (CYANOCOBALAMIN) 1000 MCG tablet Take 1,000 mcg by mouth daily.       No current facility-administered medications on file prior to visit.     PAST MEDICAL HISTORY: Past Medical History:  Diagnosis Date  . Allergic rhinitis, unspecified    Allergy testing via allergist 04/2017--mostly neg.  Dr. Donneta Romberg recommended referral to ENT so i did this.  . Back pain   . BPH (benign prostatic hypertrophy)   . Chronic nasal congestion   . DDD (degenerative disc disease), lumbar 2010   laminectomy 01/2009  . Depression    Hospitalized for suicidal ideation 30.  Has been on lexapro since 10/2008.  . Diabetes mellitus type II    Dx'd approximately 06/2008.  No retinopathy as of 02/2017 ophth.  . Erectile dysfunction   . Erectile dysfunction   . History of colon polyps 01/2016  Recall 3 yrs  . History of pneumonia 2008   Hospitalized  . History of scarlet fever    age 19  . Hyperlipidemia, mixed 1993   myalgias on pravastatin  . Hypertension 2009   "marked chronic cardiomegaly" on CXR 01/2009 per old records.  . 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 Insomnia   . Keratoconus    dx'd 1973  . Memory changes   . OSA on CPAP 2004; 2019   Back on CPAP 04/2018  . Osteoarthritis of both knees    Mild plain film changes in medial compartment bilat  . Restless leg   . Rhinitis, chronic   . 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 APPENDECTOMY  1972  . COLONOSCOPY  X 4   Most recent was 2007 [place], [place]), with removal of polyps in the first two.  02/21/16: tubular adenoma.  Recall 3 yrs (Dr. Ardis Hughs).  . 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 ELECTROCARDIOGRAM  01/29/2009   NORMAL  . LUMBAR LAMINECTOMY  01/2009  . TONSILLECTOMY AND ADENOIDECTOMY     age 27  . URETHRAL DILATION     2 as a child, one as an adult  . VASECTOMY  1994  . VITRECTOMY      SOCIAL HISTORY: Social History   Tobacco Use  . Smoking status: Former Research scientist (life sciences)  . Smokeless tobacco: Never Used  Substance Use Topics  . Alcohol use: Yes    Comment: social, 3-4 drinks month  . Drug use: No    FAMILY HISTORY: Family History  Problem Relation Age of Onset  . Heart disease Mother   . Hyperlipidemia Mother   . Stroke Mother   . Diabetes Mother   . Depression Mother   . Alzheimer's disease Mother   . Stroke Father   . Hyperlipidemia Father   . Heart disease Father   . Diabetes Father   . Hypertension Father   . Obesity Father   . Alcohol abuse Sister   . Depression Sister    ROS: Review of Systems  Constitutional: Positive for weight loss.  Cardiovascular: Negative for chest pain.  Gastrointestinal: Negative for diarrhea, nausea and vomiting.  Endo/Heme/Allergies:       Negative for hypoglycemia.   PHYSICAL EXAM: Blood pressure 107/65, pulse 77, temperature 97.9 F (36.6 C), height 5\' 10"  (1.778 m), weight 247 lb (112 kg), SpO2 94 %. Body mass index is 35.44 kg/m. Physical Exam Vitals signs reviewed.  Constitutional:      Appearance: Normal appearance. He is obese.  Cardiovascular:     Rate and Rhythm: Normal rate.     Pulses: Normal pulses.  Pulmonary:     Effort: Pulmonary effort is normal.     Breath sounds: Normal breath sounds.  Musculoskeletal: Normal range of motion.  Skin:    General: Skin is warm and dry.  Neurological:     Mental Status: He is alert and oriented to person, place, and time.  Psychiatric:        Behavior: Behavior normal.   RECENT LABS AND TESTS: BMET    Component Value Date/Time   NA 137 08/10/2018 1033   NA 136 02/14/2018 0848   K 4.2 08/10/2018 1033   CL 103 08/10/2018 1033   CO2 26  08/10/2018 1033   GLUCOSE  113 (H) 08/10/2018 1033   BUN 25 (H) 08/10/2018 1033   BUN 26 02/14/2018 0848   CREATININE 0.97 08/10/2018 1033   CREATININE 0.94 01/18/2017 0832   CALCIUM 9.8 08/10/2018 1033   GFRNONAA 79 02/14/2018 0848   GFRAA 91 02/14/2018 0848   Lab Results  Component Value Date   HGBA1C 5.8 (A) 11/14/2018   HGBA1C 6.3 08/10/2018   HGBA1C 6.5 05/09/2018   HGBA1C 7.6 (H) 02/04/2018   HGBA1C 7.1 11/05/2017   No results found for: INSULIN CBC    Component Value Date/Time   WBC 7.2 02/14/2018 0848   WBC 6.2 05/21/2014 1541   RBC 4.63 02/14/2018 0848   RBC 4.45 05/21/2014 1541   HGB 13.4 02/14/2018 0848   HCT 39.3 02/14/2018 0848   PLT 211.0 05/21/2014 1541   MCV 85 02/14/2018 0848   MCH 28.9 02/14/2018 0848   MCHC 34.1 02/14/2018 0848   MCHC 33.3 05/21/2014 1541   RDW 14.1 02/14/2018 0848   LYMPHSABS 2.5 02/14/2018 0848   MONOABS 0.4 05/21/2014 1541   EOSABS 0.2 02/14/2018 0848   BASOSABS 0.0 02/14/2018 0848   Iron/TIBC/Ferritin/ %Sat No results found for: IRON, TIBC, FERRITIN, IRONPCTSAT Lipid Panel     Component Value Date/Time   CHOL 117 08/10/2018 1033   TRIG 91.0 08/10/2018 1033   HDL 44.60 08/10/2018 1033   CHOLHDL 3 08/10/2018 1033   VLDL 18.2 08/10/2018 1033   LDLCALC 55 08/10/2018 1033   LDLDIRECT 112.0 07/31/2016 1116   Hepatic Function Panel     Component Value Date/Time   PROT 6.8 02/14/2018 0848   ALBUMIN 4.4 02/14/2018 0848   AST 16 02/14/2018 0848   ALT 23 02/14/2018 0848   ALKPHOS 35 (L) 02/14/2018 0848   BILITOT 0.4 02/14/2018 0848      Component Value Date/Time   TSH 2.030 02/14/2018 0848   TSH 1.46 05/21/2014 1541   TSH 2.50 05/10/2013 0832   Results for [name] [name] "TOM" (MRN [id number]) as of 02/14/2019 09:39  Ref. Range 08/11/2018 14:17  Vitamin D, 25-Hydroxy Latest Ref Range: 30.0 - 100.0 ng/mL 18.2 (L)   OBESITY BEHAVIORAL INTERVENTION VISIT  Today's visit was #17  Starting weight: 272 lbs Starting  date: 02/14/2018 Today's weight: 274 lbs  Today's date: 02/14/2019 Total lbs lost to date: 25 At least 15 minutes were spent on discussing the following behavioral intervention visit.    02/14/2019  Height 5\' 10"  (1.778 m)  Weight 247 lb (112 kg)  BMI (Calculated) 35.44  BLOOD PRESSURE - SYSTOLIC 751  BLOOD PRESSURE - DIASTOLIC 65   Body Fat % 02.5 %  Total Body Water (lbs) 116.8 lbs   ASK: We discussed the diagnosis of obesity with [name] [name] today and [name] [name] agreed to give [name] permission to discuss obesity behavioral modification therapy today.  ASSESS: [name] [name] has the diagnosis of obesity and his BMI today is 35.44. [name] [name] is in the action stage of change.   ADVISE: [name] [name] was educated on the multiple health risks of obesity as well as the benefit of weight loss to improve his health. He was advised of the need for long term treatment and the importance of lifestyle modifications to improve his current health and to decrease his risk of future health problems.  AGREE: Multiple dietary modification options and treatment options were discussed and  [place] agreed to follow the recommendations documented in the above note.  ARRANGE: [name] [name] was educated on the importance of frequent visits to treat obesity as outlined per CMS  and USPSTF guidelines and agreed to schedule his next follow up appointment today.  Migdalia Dk, am acting as transcriptionist for Abby Potash, PA-C I, Abby Potash, PA-C have reviewed above note and agree with its content

## 2019-02-15 LAB — COMPREHENSIVE METABOLIC PANEL
ALT: 17 IU/L (ref 0–44)
AST: 9 IU/L (ref 0–40)
Albumin/Globulin Ratio: 2.1 (ref 1.2–2.2)
Albumin: 4.5 g/dL (ref 3.8–4.8)
Alkaline Phosphatase: 35 IU/L — ABNORMAL LOW (ref 39–117)
BUN/Creatinine Ratio: 29 — ABNORMAL HIGH (ref 10–24)
BUN: 28 mg/dL — ABNORMAL HIGH (ref 8–27)
Bilirubin Total: 0.4 mg/dL (ref 0.0–1.2)
CO2: 23 mmol/L (ref 20–29)
Calcium: 9.9 mg/dL (ref 8.6–10.2)
Chloride: 102 mmol/L (ref 96–106)
Creatinine, Ser: 0.95 mg/dL (ref 0.76–1.27)
GFR calc Af Amer: 96 mL/min/{1.73_m2} (ref >59)
GFR calc non Af Amer: 83 mL/min/{1.73_m2} (ref >59)
Globulin, Total: 2.1 g/dL (ref 1.5–4.5)
Glucose: 109 mg/dL — ABNORMAL HIGH (ref 65–99)
Potassium: 4.5 mmol/L (ref 3.5–5.2)
Sodium: 144 mmol/L (ref 134–144)
Total Protein: 6.6 g/dL (ref 6.0–8.5)

## 2019-02-15 LAB — HEMOGLOBIN A1C
Est. average glucose Bld gHb Est-mCnc: 128 mg/dL
Hgb A1c MFr Bld: 6.1 % — ABNORMAL HIGH (ref 4.8–5.6)

## 2019-02-15 LAB — LIPID PANEL WITH LDL/HDL RATIO
Cholesterol, Total: 135 mg/dL (ref 100–199)
HDL: 45 mg/dL (ref >39)
LDL Calculated: 72 mg/dL (ref 0–99)
LDl/HDL Ratio: 1.6 ratio (ref 0.0–3.6)
Triglycerides: 88 mg/dL (ref 0–149)
VLDL Cholesterol Cal: 18 mg/dL (ref 5–40)

## 2019-02-15 LAB — VITAMIN D 25 HYDROXY (VIT D DEFICIENCY, FRACTURES): Vit D, 25-Hydroxy: 12 ng/mL — ABNORMAL LOW (ref 30.0–100.0)

## 2019-02-17 ENCOUNTER — Encounter: Payer: Self-pay | Admitting: *Deleted

## 2019-02-17 ENCOUNTER — Other Ambulatory Visit: Payer: Self-pay | Admitting: Family Medicine

## 2019-02-21 ENCOUNTER — Encounter (INDEPENDENT_AMBULATORY_CARE_PROVIDER_SITE_OTHER): Payer: Self-pay

## 2019-03-10 ENCOUNTER — Encounter: Payer: Self-pay | Admitting: Family Medicine

## 2019-03-10 ENCOUNTER — Encounter (INDEPENDENT_AMBULATORY_CARE_PROVIDER_SITE_OTHER): Payer: Self-pay | Admitting: Physician Assistant

## 2019-03-13 ENCOUNTER — Encounter (INDEPENDENT_AMBULATORY_CARE_PROVIDER_SITE_OTHER): Payer: Self-pay

## 2019-03-13 ENCOUNTER — Telehealth: Payer: Self-pay

## 2019-03-13 ENCOUNTER — Encounter: Payer: Self-pay | Admitting: Family Medicine

## 2019-03-13 NOTE — Telephone Encounter (Signed)
Stop lisinopril.  Make sure he's drinking at least 65 ounces of fluids per day.  Check bp AND heart rate every morning and every evening for the next 4d and write them down; needs virtual visit in 4d.-thx

## 2019-03-13 NOTE — Telephone Encounter (Signed)
Spoke with patient regarding PCP recommendations and pt scheduled for Friday 4/17 to follow up.

## 2019-03-14 ENCOUNTER — Other Ambulatory Visit: Payer: Self-pay

## 2019-03-14 ENCOUNTER — Ambulatory Visit (INDEPENDENT_AMBULATORY_CARE_PROVIDER_SITE_OTHER): Payer: Medicare Other | Admitting: Physician Assistant

## 2019-03-14 ENCOUNTER — Encounter (INDEPENDENT_AMBULATORY_CARE_PROVIDER_SITE_OTHER): Payer: Self-pay | Admitting: Physician Assistant

## 2019-03-14 DIAGNOSIS — F3289 Other specified depressive episodes: Secondary | ICD-10-CM

## 2019-03-14 DIAGNOSIS — Z794 Long term (current) use of insulin: Secondary | ICD-10-CM | POA: Diagnosis not present

## 2019-03-14 DIAGNOSIS — E119 Type 2 diabetes mellitus without complications: Secondary | ICD-10-CM

## 2019-03-14 DIAGNOSIS — Z6835 Body mass index (BMI) 35.0-35.9, adult: Secondary | ICD-10-CM

## 2019-03-14 MED ORDER — LIRAGLUTIDE 18 MG/3ML ~~LOC~~ SOPN: 1.8000 mg | PEN_INJECTOR | Freq: Every morning | SUBCUTANEOUS | 0 refills | Status: DC

## 2019-03-14 MED ORDER — ESCITALOPRAM OXALATE 20 MG PO TABS: ORAL_TABLET | ORAL | 1 refills | Status: DC

## 2019-03-14 NOTE — Progress Notes (Signed)
Office: [phone]  /  Fax: [phone] TeleHealth Visit:  [name] [name] has verbally consented to this TeleHealth visit today. The patient is located at home, the provider is located at the [facility] office. The participants in this visit include the listed provider, patient, and patient's wife, [name] [name]. The visit was conducted today via Webex.  HPI:   Chief Complaint: OBESITY [name] [name] is here to discuss his progress with his obesity treatment plan. He is on the Category 4 plan and journaling 550-700 calories + 45 grams of protein and is following his eating plan approximately 80% of the time. He states he is walking 15 minutes 3 times per week. [name] [name] reports that he has been doing some comfort eating due to the recent death of his dog. He states he is snacking a lot at night. He states he was lightheaded and contacted his PCP. He has discontinued lisinopril and has a TeleHealth follow-up visit on 04/17. We were unable to weigh the patient today for this TeleHealth visit. He feels as if he has maintained his weight since his last visit. He has lost 25 lbs since starting treatment with [name].  Diabetes Mellitus [name] [name] has a diagnosis of diabetes mellitus and is on insulin, Victoza, and metformin. [name] [name] states fasting blood sugars are in the range of 108 and 118. Last A1c was reported to be 6.1 on 02/14/2019. He has been working on intensive lifestyle modifications including diet, exercise, and weight loss to help control his blood glucose levels.  Depression with emotional eating behaviors [name] [name] is struggling with emotional eating and using food for comfort to the extent that it is negatively impacting his health. He often snacks when he is not hungry. [name] [name] sometimes feels he is out of control and then feels guilty that he made poor food choices. He has been working on behavior modification techniques to help reduce his emotional eating and has been somewhat successful. He  shows no sign of suicidal or homicidal ideations. He does report cravings recently due to the death of his dog.  Depression screen [name] [name] 2/9 08/10/2018 02/14/2018 02/04/2018 11/05/2017 07/08/2017  Decreased Interest 0 2 0 0 0  Down, Depressed, Hopeless 0 1 1 1  0  PHQ - 2 Score 0 3 1 1  0  Altered sleeping 1 1 3 3  -  Tired, decreased energy 2 3 1 2  -  Change in appetite 0 2 0 1 -  Feeling bad or failure about yourself  0 1 0 1 -  Trouble concentrating 1 3 0 1 -  Moving slowly or fidgety/restless 0 1 0 0 -  Suicidal thoughts 0 0 0 0 -  PHQ-9 Score 4 14 5 9  -  Difficult doing work/chores Not difficult at all Not difficult at all Not difficult at all Not difficult at all -   ASSESSMENT AND PLAN:  Type 2 diabetes mellitus without complication, with long-term current use of insulin ([name] [name]) - Plan: liraglutide (VICTOZA) 18 MG/3ML SOPN  Other depression - with emotional eating - Plan: escitalopram (LEXAPRO) 20 MG tablet  Class 2 severe obesity with serious comorbidity and body mass index (BMI) of 35.0 to 35.9 in adult, unspecified obesity type (HCC)  PLAN:  Diabetes Mellitus [name] [name] has been given extensive diabetes education by myself today including ideal fasting and post-prandial blood glucose readings, individual ideal HgA1c goals  and hypoglycemia prevention. We discussed the importance of good blood sugar control to decrease the likelihood of diabetic complications such as nephropathy, neuropathy, limb loss,  blindness, coronary artery disease, and death. We discussed the importance of intensive lifestyle modification including diet, exercise and weight loss as the first line treatment for diabetes. [name] [name] was given a refill on his Victoza #3 with 0 refills and agrees to follow-up with our clinic in 2 weeks.  Depression with Emotional Eating Behaviors We discussed behavior modification techniques today to help [name] [name] deal with his emotional eating and depression. He was given a refill on his Lexapro  #30 with 0 refills and agrees to follow-up with our clinic in 2 weeks.  Obesity [name] [name] is currently in the action stage of change. As such, his goal is to continue with weight loss efforts. He has agreed to follow the Category 4 plan. [name] [name] has been instructed to work up to a goal of 150 minutes of combined cardio and strengthening exercise per week for weight loss and overall health benefits. We discussed the following Behavioral Modification Strategies today: work on meal planning, easy cooking plans, and keeping healthy foods in the home.  [name] [name] has agreed to follow-up with our clinic in 2 weeks. He was informed of the importance of frequent follow-up visits to maximize his success with intensive lifestyle modifications for his multiple health conditions.  ALLERGIES: Allergies  Allergen Reactions   Simvastatin Other (See Comments)    myalgias   Antihistamines, Diphenhydramine-Type Other (See Comments)    depression   Codeine Nausea Only   Penicillins Hives   Sulfa Antibiotics Hives    MEDICATIONS: Current Outpatient Medications on File Prior to Visit  Medication Sig Dispense Refill   aspirin 325 MG tablet Take 325 mg by mouth daily.       atorvastatin (LIPITOR) 80 MG tablet Take 1 tablet (80 mg total) by mouth daily. 90 tablet 1   Choline Fenofibrate (FENOFIBRIC ACID) 135 MG CPDR Take 1 capsule by mouth daily. 90 capsule 1   finasteride (PROSCAR) 5 MG tablet TAKE 1 TABLET BY MOUTH ONCE DAILY 90 tablet 1   fluticasone (FLONASE) 50 MCG/ACT nasal spray Place 2 sprays into both nostrils daily. 48 g 1   insulin detemir (LEVEMIR) 100 UNIT/ML injection Inject 0.56 mLs (56 Units total) into the skin daily. 10 mL 6   Insulin Pen Needle (BD PEN NEEDLE NANO U/F) 32G X 4 MM MISC USE FOR INSULIN INJECTION FOUR TIMES A DAY 100 each 12   lisinopril (PRINIVIL,ZESTRIL) 5 MG tablet Take 1 tablet (5 mg total) by mouth daily. 90 tablet 1   MELATONIN PO Take by mouth.     metFORMIN  (GLUCOPHAGE) 1000 MG tablet Take 1 tablet (1,000 mg total) by mouth 2 (two) times daily with a meal. 180 tablet 1   Naproxen Sodium (ALEVE) 220 MG CAPS Take 2 capsules by mouth at bedtime as needed.       Omega-3 Fatty Acids (FISH OIL) 1000 MG CAPS Take 1 capsule by mouth 2 (two) times daily.     ONE TOUCH ULTRA TEST test strip TEST THREE TIMES DAILY 100 each 11   SALINE NASAL SPRAY NA Place into the nose daily as needed.     tamsulosin (FLOMAX) 0.4 MG CAPS capsule Take 2 capsules (0.8 mg total) by mouth at bedtime. 180 capsule 1   vitamin B-12 (CYANOCOBALAMIN) 1000 MCG tablet Take 1,000 mcg by mouth daily.       No current facility-administered medications on file prior to visit.     PAST MEDICAL HISTORY: Past Medical History:  Diagnosis Date   Allergic rhinitis, unspecified  Allergy testing via allergist 04/2017--mostly neg.  Dr. Donneta Romberg recommended referral to ENT so i did this.   Back pain    BPH (benign prostatic hypertrophy)    Chronic nasal congestion    DDD (degenerative disc disease), lumbar 2010   laminectomy 01/2009   Depression    Hospitalized for suicidal ideation 1992.  Has been on lexapro since 10/2008.   Diabetes mellitus type II    Dx'd approximately 06/2008.  No retinopathy as of 02/2017 ophth.   Erectile dysfunction    History of colon polyps 01/2016   Recall 3 yrs   History of pneumonia 2008   Hospitalized   History of scarlet fever    age 71   Hyperlipidemia, mixed 1993   myalgias on pravastatin   Hypertension 2009   "marked chronic cardiomegaly" on CXR 01/2009 per old records.   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  02/21/16: tubular adenoma.  Recall 3 yrs (Dr. Ardis Hughs).   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   ELECTROCARDIOGRAM  01/29/2009   NORMAL   LUMBAR LAMINECTOMY  01/2009   TONSILLECTOMY AND ADENOIDECTOMY     age 33   URETHRAL DILATION     2 as a child, one as an adult   VASECTOMY  1994   VITRECTOMY      SOCIAL HISTORY: Social History   Tobacco Use   Smoking status: Former Smoker   Smokeless tobacco: Never Used  Substance Use Topics   Alcohol use: Yes    Comment: social, 3-4 drinks month   Drug use: No    FAMILY HISTORY: Family History  Problem Relation Age of Onset   Heart disease Mother    Hyperlipidemia Mother    Stroke Mother    Diabetes Mother    Depression Mother    Alzheimer's disease Mother    Stroke Father    Hyperlipidemia Father    Heart disease Father    Diabetes Father    Hypertension Father    Obesity Father    Alcohol abuse Sister    Depression Sister    ROS: Review of Systems  Psychiatric/Behavioral: Positive for depression (emotional eating). Negative for suicidal ideas.       Negative for homicidal ideas.   PHYSICAL EXAM: Pt in no acute distress  RECENT LABS AND TESTS: BMET    Component Value Date/Time   NA 144 02/14/2019 1019   K 4.5 02/14/2019 1019   CL 102 02/14/2019 1019   CO2 23 02/14/2019 1019   GLUCOSE 109 (H) 02/14/2019 1019   GLUCOSE 113 (H) 08/10/2018 1033   BUN 28 (H) 02/14/2019 1019   CREATININE 0.95 02/14/2019 1019   CREATININE 0.94 01/18/2017 0832   CALCIUM 9.9 02/14/2019 1019   GFRNONAA 83 02/14/2019 1019   GFRAA 96 02/14/2019  1019   Lab Results  Component Value Date   HGBA1C 6.1 (H) 02/14/2019   HGBA1C 5.8 (A) 11/14/2018   HGBA1C 6.3 08/10/2018   HGBA1C 6.5 05/09/2018    HGBA1C 7.6 (H) 02/04/2018   No results found for: INSULIN CBC    Component Value Date/Time   WBC 7.2 02/14/2018 0848   WBC 6.2 05/21/2014 1541   RBC 4.63 02/14/2018 0848   RBC 4.45 05/21/2014 1541   HGB 13.4 02/14/2018 0848   HCT 39.3 02/14/2018 0848   PLT 211.0 05/21/2014 1541   MCV 85 02/14/2018 0848   MCH 28.9 02/14/2018 0848   MCHC 34.1 02/14/2018 0848   MCHC 33.3 05/21/2014 1541   RDW 14.1 02/14/2018 0848   LYMPHSABS 2.5 02/14/2018 0848   MONOABS 0.4 05/21/2014 1541   EOSABS 0.2 02/14/2018 0848   BASOSABS 0.0 02/14/2018 0848   Iron/TIBC/Ferritin/ %Sat No results found for: IRON, TIBC, FERRITIN, IRONPCTSAT Lipid Panel     Component Value Date/Time   CHOL 135 02/14/2019 1019   TRIG 88 02/14/2019 1019   HDL 45 02/14/2019 1019   CHOLHDL 3 08/10/2018 1033   VLDL 18.2 08/10/2018 1033   LDLCALC 72 02/14/2019 1019   LDLDIRECT 112.0 07/31/2016 1116   Hepatic Function Panel     Component Value Date/Time   PROT 6.6 02/14/2019 1019   ALBUMIN 4.5 02/14/2019 1019   AST 9 02/14/2019 1019   ALT 17 02/14/2019 1019   ALKPHOS 35 (L) 02/14/2019 1019   BILITOT 0.4 02/14/2019 1019      Component Value Date/Time   TSH 2.030 02/14/2018 0848   TSH 1.46 05/21/2014 1541   TSH 2.50 05/10/2013 0832   Results for TALYN, DESSERT "TOM" (MRN [id number]) as of 03/14/2019 12:25  Ref. Range 02/14/2019 10:19  Vitamin D, 25-Hydroxy Latest Ref Range: 30.0 - 100.0 ng/mL 12.0 (L)   I, Michaelene Song, am acting as Location manager for [employer], PA-C I, Abby Potash, PA-C have reviewed above note and agree with its content

## 2019-03-17 ENCOUNTER — Encounter: Payer: Self-pay | Admitting: Family Medicine

## 2019-03-17 ENCOUNTER — Ambulatory Visit (INDEPENDENT_AMBULATORY_CARE_PROVIDER_SITE_OTHER): Payer: Medicare Other | Admitting: Family Medicine

## 2019-03-17 ENCOUNTER — Other Ambulatory Visit: Payer: Self-pay

## 2019-03-17 VITALS — BP 120/67 | HR 82

## 2019-03-17 DIAGNOSIS — I959 Hypotension, unspecified: Secondary | ICD-10-CM

## 2019-03-17 DIAGNOSIS — R42 Dizziness and giddiness: Secondary | ICD-10-CM | POA: Diagnosis not present

## 2019-03-17 DIAGNOSIS — R5383 Other fatigue: Secondary | ICD-10-CM

## 2019-03-17 NOTE — Progress Notes (Signed)
Virtual Visit via Video Note  I connected with pt  on 03/17/19 at 11:20 AM EDT by a video enabled telemedicine application and verified that I am speaking with the correct person using two identifiers.  Location patient: home Location provider:work or home office Persons participating in the virtual visit: patient, provider  I discussed the limitations of evaluation and management by telemedicine and the availability of in person appointments. The patient expressed understanding and agreed to proceed.  Telemedicine visit is a necessity given the COVID-19 restrictions in place at the current time.  HPI: 68 y/o WM being seen for 2 week hx of periods of fatigue and lightheadedness. Describes the feeling as "woozy", slight feeling of warmth prior.  No presyncope. Not posturally or positionally induced.  Felt a little nausea a few times.  No vertigo. Usually lasts 5-10 minutes.  No palp's, CP, or SOB. Says fluid intake has been BETTER than usual.  No new meds recently, no otc meds. Between episodes he feels well--no recent melena, fevers, vomiting, diarrhea, CP, or focal weakness.  BP checks around these times were low but pt doesn't have numbers (some in 85I systolic he recalls).. We told him to d/c his lisinopril and start checking bp bid.  Since then: BP 108-132/60s. Mostly around 110-120/60s. P 70-103. He still has the feelings of episodes of lightheadedness and fatigue but not feeling them as often. They seem to still be occurring a few times a day.  Glucoses: every morning checks it: 102-118. No checks during these episodes.  +Extra stress lately with a sick dog that he had to put down.   ROS: See pertinent positives and negatives per HPI.  Past Medical History:  Diagnosis Date  . Allergic rhinitis, unspecified    Allergy testing via allergist 04/2017--mostly neg.  Dr. Donneta Romberg recommended referral to ENT so i did this.  . Back pain   . BPH (benign prostatic hypertrophy)   .  Chronic nasal congestion   . DDD (degenerative disc disease), lumbar 2010   laminectomy 01/2009  . Depression    Hospitalized for suicidal ideation 70.  Has been on lexapro since 10/2008.  . Diabetes mellitus type II    Dx'd approximately 06/2008.  No retinopathy as of 02/2017 ophth.  . Erectile dysfunction   . History of colon polyps 01/2016   Recall 3 yrs  . History of pneumonia 2008   Hospitalized  . History of scarlet fever    age 14  . Hyperlipidemia, mixed 1993   myalgias on pravastatin  . Hypertension 2009   "marked chronic cardiomegaly" on CXR 01/2009 per old records.  . 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 Insomnia   . Keratoconus    dx'd 1973  . Memory changes   . OSA on CPAP 2004; 2019   Back on CPAP 04/2018  . Osteoarthritis of both knees    Mild plain film changes in medial compartment bilat  . Restless leg   . Rhinitis, chronic   . 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 APPENDECTOMY  1972  . COLONOSCOPY  X 4   Most recent was 2007 [place], [place]), with removal of polyps in the first two.  02/21/16: tubular adenoma.  Recall 3 yrs (Dr. Ardis Hughs).  . 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 ELECTROCARDIOGRAM  01/29/2009  NORMAL  . LUMBAR LAMINECTOMY  01/2009  . TONSILLECTOMY AND ADENOIDECTOMY     age 48  . URETHRAL DILATION     2 as a child, one as an adult  . VASECTOMY  1994  . VITRECTOMY      Family History  Problem Relation Age of Onset  . Heart disease Mother   . Hyperlipidemia Mother   . Stroke Mother   . Diabetes Mother   . Depression Mother   . Alzheimer's disease Mother   . Stroke Father   . Hyperlipidemia Father   . Heart disease Father   . Diabetes Father   . Hypertension Father   . Obesity Father   . Alcohol abuse Sister   . Depression Sister       Current Outpatient Medications:  .  aspirin 325 MG tablet, Take  325 mg by mouth daily.  , Disp: , Rfl:  .  atorvastatin (LIPITOR) 80 MG tablet, Take 1 tablet (80 mg total) by mouth daily., Disp: 90 tablet, Rfl: 1 .  Choline Fenofibrate (FENOFIBRIC ACID) 135 MG CPDR, Take 1 capsule by mouth daily., Disp: 90 capsule, Rfl: 1 .  escitalopram (LEXAPRO) 20 MG tablet, TAKE 1 TABLET(20 MG) BY MOUTH DAILY, Disp: 30 tablet, Rfl: 1 .  finasteride (PROSCAR) 5 MG tablet, TAKE 1 TABLET BY MOUTH ONCE DAILY, Disp: 90 tablet, Rfl: 1 .  fluticasone (FLONASE) 50 MCG/ACT nasal spray, Place 2 sprays into both nostrils daily., Disp: 48 g, Rfl: 1 .  insulin detemir (LEVEMIR) 100 UNIT/ML injection, Inject 0.56 mLs (56 Units total) into the skin daily., Disp: 10 mL, Rfl: 6 .  Insulin Pen Needle (BD PEN NEEDLE NANO U/F) 32G X 4 MM MISC, USE FOR INSULIN INJECTION FOUR TIMES A DAY, Disp: 100 each, Rfl: 12 .  liraglutide (VICTOZA) 18 MG/3ML SOPN, Inject 0.3 mLs (1.8 mg total) into the skin every morning., Disp: 3 pen, Rfl: 0 .  MELATONIN PO, Take by mouth., Disp: , Rfl:  .  metFORMIN (GLUCOPHAGE) 1000 MG tablet, Take 1 tablet (1,000 mg total) by mouth 2 (two) times daily with a meal., Disp: 180 tablet, Rfl: 1 .  Naproxen Sodium (ALEVE) 220 MG CAPS, Take 2 capsules by mouth at bedtime as needed.  , Disp: , Rfl:  .  Omega-3 Fatty Acids (FISH OIL) 1000 MG CAPS, Take 1 capsule by mouth 2 (two) times daily., Disp: , Rfl:  .  ONE TOUCH ULTRA TEST test strip, TEST THREE TIMES DAILY, Disp: 100 each, Rfl: 11 .  SALINE NASAL SPRAY NA, Place into the nose daily as needed., Disp: , Rfl:  .  tamsulosin (FLOMAX) 0.4 MG CAPS capsule, Take 2 capsules (0.8 mg total) by mouth at bedtime., Disp: 180 capsule, Rfl: 1 .  vitamin B-12 (CYANOCOBALAMIN) 1000 MCG tablet, Take 1,000 mcg by mouth daily.  , Disp: , Rfl:  .  lisinopril (PRINIVIL,ZESTRIL) 5 MG tablet, Take 1 tablet (5 mg total) by mouth daily. (Patient not taking: Reported on 03/17/2019), Disp: 90 tablet, Rfl: 1  EXAM:  VITALS per patient if  applicable: BP 563/87 (BP Location: Left Arm, Patient Position: Sitting, Cuff Size: Normal)   Pulse 82    GENERAL: alert, oriented, appears well and in no acute distress  HEENT: atraumatic, conjunttiva clear, no obvious abnormalities on inspection of external nose and ears  NECK: normal movements of the head and neck  LUNGS: on inspection no signs of respiratory distress, breathing rate appears normal, no obvious gross SOB, gasping or wheezing  CV: no obvious cyanosis  MS: moves all visible extremities without noticeable abnormality  PSYCH/NEURO: pleasant and cooperative, no obvious depression or anxiety, speech and thought processing grossly intact  LABS: none today  Lab Results  Component Value Date   HGBA1C 6.1 (H) 02/14/2019     Chemistry      Component Value Date/Time   NA 144 02/14/2019 1019   K 4.5 02/14/2019 1019   CL 102 02/14/2019 1019   CO2 23 02/14/2019 1019   BUN 28 (H) 02/14/2019 1019   CREATININE 0.95 02/14/2019 1019   CREATININE 0.94 01/18/2017 0832      Component Value Date/Time   CALCIUM 9.9 02/14/2019 1019   ALKPHOS 35 (L) 02/14/2019 1019   AST 9 02/14/2019 1019   ALT 17 02/14/2019 1019   BILITOT 0.4 02/14/2019 1019     Lab Results  Component Value Date   TSH 2.030 02/14/2018    ASSESSMENT AND PLAN:  Discussed the following assessment and plan:  1) Episodic disequilibrium/fatigue. Slightly improved off of lisinopril, and bp's are more consistently up near normal since off this med. Unclear why this would suddenly become a prob on this med for him, though. Stress could certainly be playing a role. No red flags for ominous cardiopulmonary problem at this time. Watchful waiting approach for the next 1 wk: stay off lisinopril, continue to monitor bp and HR.  I encouraged him to try to check a glucose level DURING one of his episodes. If he is not improved at f/u in 1 wk then plan is to get holter monitor or event monitor, depending on how  frequent the episodes are occurring at that time. Would also check BMET, mag, TSH at that time.  Spent 25 min with pt today, with >50% of this time spent in counseling and care coordination regarding the above problems.    I discussed the assessment and treatment plan with the patient. The patient was provided an opportunity to ask questions and all were answered. The patient agreed with the plan and demonstrated an understanding of the instructions.   The patient was advised to call back or seek an in-person evaluation if the symptoms worsen or if the condition fails to improve as anticipated.   F/u: 1 week virtual visit.  Signed:  Crissie Sickles, MD           03/17/2019

## 2019-03-27 ENCOUNTER — Other Ambulatory Visit: Payer: Self-pay

## 2019-03-27 ENCOUNTER — Ambulatory Visit (INDEPENDENT_AMBULATORY_CARE_PROVIDER_SITE_OTHER): Payer: Medicare Other | Admitting: Family Medicine

## 2019-03-27 ENCOUNTER — Encounter: Payer: Self-pay | Admitting: Family Medicine

## 2019-03-27 VITALS — BP 143/71 | HR 93

## 2019-03-27 DIAGNOSIS — I10 Essential (primary) hypertension: Secondary | ICD-10-CM | POA: Diagnosis not present

## 2019-03-27 DIAGNOSIS — R42 Dizziness and giddiness: Secondary | ICD-10-CM | POA: Diagnosis not present

## 2019-03-27 MED ORDER — INSULIN DETEMIR 100 UNIT/ML FLEXPEN: PEN_INJECTOR | SUBCUTANEOUS | 11 refills | Status: DC

## 2019-03-27 NOTE — Progress Notes (Signed)
Virtual Visit via Video Note  I connected with [name] [name]  on 03/27/19 at  1:20 PM EDT by a video enabled telemedicine application and verified that I am speaking with the correct person using two identifiers.  Location patient: home Location provider:work or home office Persons participating in the virtual visit: patient, provider  I discussed the limitations of evaluation and management by telemedicine and the availability of in person appointments. The patient expressed understanding and agreed to proceed.   HPI: 68 y/o WM being seen for 10d f/u periodic fatigue and lightheadedness. BP low during these periods for the most part. I d/c'd his lisinopril and subsequent bp checks were in low normal range, P 70s-103. No glucose checks during one of these episodes. The episodes improved/were less frequent but were still occurring as of 03/17/19 visit.  Plan was to stay off lisinopril, try to check gluc during an episode, continue to monitor bp/hr. If not improving at today's f/u then plan is to get CBC, BMET, mag, TSH and set up Holter or event monitor.  ++stress lately in his life.  Interim hx:  He has not had any further periods of symptoms since I saw him last! Feeling well.  He has NOT been taking the lisinopril.  BPs: 120/70 avg, HR avg 80. Lowest syst was 106, lowest diast 61.  Glucoses: fasting 100 is lowest. Evening 180 highest but this was an outlier. ROS: See pertinent positives and negatives per HPI.  Past Medical History:  Diagnosis Date  . Allergic rhinitis, unspecified    Allergy testing via allergist 04/2017--mostly neg.  Dr. Donneta Romberg recommended referral to ENT so i did this.  . Back pain   . BPH (benign prostatic hypertrophy)   . Chronic nasal congestion   . DDD (degenerative disc disease), lumbar 2010   laminectomy 01/2009  . Depression    Hospitalized for suicidal ideation 45.  Has been on lexapro since 10/2008.  . Diabetes mellitus type II    Dx'd approximately  06/2008.  No retinopathy as of 02/2017 ophth.  . Erectile dysfunction   . History of colon polyps 01/2016   Recall 3 yrs  . History of pneumonia 2008   Hospitalized  . History of scarlet fever    age 36  . Hyperlipidemia, mixed 1993   myalgias on pravastatin  . Hypertension 2009   "marked chronic cardiomegaly" on CXR 01/2009 per old records.  . 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 Insomnia   . Keratoconus    dx'd 1973  . Memory changes   . OSA on CPAP 2004; 2019   Back on CPAP 04/2018  . Osteoarthritis of both knees    Mild plain film changes in medial compartment bilat  . Restless leg   . Rhinitis, chronic   . 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 APPENDECTOMY  1972  . COLONOSCOPY  X 4   Most recent was 2007 [place], [place]), with removal of polyps in the first two.  02/21/16: tubular adenoma.  Recall 3 yrs (Dr. Ardis Hughs).  . 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 ELECTROCARDIOGRAM  01/29/2009   NORMAL  . LUMBAR LAMINECTOMY  01/2009  . TONSILLECTOMY AND ADENOIDECTOMY     age 39  . TRANSTHORACIC ECHOCARDIOGRAM  02/22/2018   EF 55-60%, grd I DD  . URETHRAL DILATION  2 as a child, one as an adult  . VASECTOMY  1994  . VITRECTOMY      Family History  Problem Relation Age of Onset  . Heart disease Mother   . Hyperlipidemia Mother   . Stroke Mother   . Diabetes Mother   . Depression Mother   . Alzheimer's disease Mother   . Stroke Father   . Hyperlipidemia Father   . Heart disease Father   . Diabetes Father   . Hypertension Father   . Obesity Father   . Alcohol abuse Sister   . Depression Sister      Current Outpatient Medications:  .  aspirin 325 MG tablet, Take 325 mg by mouth daily.  , Disp: , Rfl:  .  atorvastatin (LIPITOR) 80 MG tablet, Take 1 tablet (80 mg total) by mouth daily., Disp: 90 tablet, Rfl: 1 .  Choline Fenofibrate  (FENOFIBRIC ACID) 135 MG CPDR, Take 1 capsule by mouth daily., Disp: 90 capsule, Rfl: 1 .  escitalopram (LEXAPRO) 20 MG tablet, TAKE 1 TABLET(20 MG) BY MOUTH DAILY, Disp: 30 tablet, Rfl: 1 .  finasteride (PROSCAR) 5 MG tablet, TAKE 1 TABLET BY MOUTH ONCE DAILY, Disp: 90 tablet, Rfl: 1 .  fluticasone (FLONASE) 50 MCG/ACT nasal spray, Place 2 sprays into both nostrils daily., Disp: 48 g, Rfl: 1 .  insulin detemir (LEVEMIR) 100 UNIT/ML injection, Inject 0.56 mLs (56 Units total) into the skin daily., Disp: 10 mL, Rfl: 6 .  Insulin Pen Needle (BD PEN NEEDLE NANO U/F) 32G X 4 MM MISC, USE FOR INSULIN INJECTION FOUR TIMES A DAY, Disp: 100 each, Rfl: 12 .  liraglutide (VICTOZA) 18 MG/3ML SOPN, Inject 0.3 mLs (1.8 mg total) into the skin every morning., Disp: 3 pen, Rfl: 0 .  lisinopril (PRINIVIL,ZESTRIL) 5 MG tablet, Take 1 tablet (5 mg total) by mouth daily., Disp: 90 tablet, Rfl: 1 .  MELATONIN PO, Take by mouth., Disp: , Rfl:  .  metFORMIN (GLUCOPHAGE) 1000 MG tablet, Take 1 tablet (1,000 mg total) by mouth 2 (two) times daily with a meal., Disp: 180 tablet, Rfl: 1 .  Naproxen Sodium (ALEVE) 220 MG CAPS, Take 2 capsules by mouth at bedtime as needed.  , Disp: , Rfl:  .  Omega-3 Fatty Acids (FISH OIL) 1000 MG CAPS, Take 1 capsule by mouth 2 (two) times daily., Disp: , Rfl:  .  ONE TOUCH ULTRA TEST test strip, TEST THREE TIMES DAILY, Disp: 100 each, Rfl: 11 .  SALINE NASAL SPRAY NA, Place into the nose daily as needed., Disp: , Rfl:  .  tamsulosin (FLOMAX) 0.4 MG CAPS capsule, Take 2 capsules (0.8 mg total) by mouth at bedtime., Disp: 180 capsule, Rfl: 1 .  vitamin B-12 (CYANOCOBALAMIN) 1000 MCG tablet, Take 1,000 mcg by mouth daily.  , Disp: , Rfl:   EXAM:  VITALS per patient if applicable: BP (!) 761/95 (BP Location: Right Arm, Patient Position: Sitting, Cuff Size: Normal)   Pulse 93    GENERAL: alert, oriented, appears well and in no acute distress  HEENT: atraumatic, conjunttiva clear, no  obvious abnormalities on inspection of external nose and ears  NECK: normal movements of the head and neck  LUNGS: on inspection no signs of respiratory distress, breathing rate appears normal, no obvious gross SOB, gasping or wheezing  CV: no obvious cyanosis  MS: moves all visible extremities without noticeable abnormality  PSYCH/NEURO: pleasant and cooperative, no obvious depression or anxiety, speech and thought processing grossly intact  LABS:  none today    Chemistry      Component Value Date/Time   NA 144 02/14/2019 1019   K 4.5 02/14/2019 1019   CL 102 02/14/2019 1019   CO2 23 02/14/2019 1019   BUN 28 (H) 02/14/2019 1019   CREATININE 0.95 02/14/2019 1019   CREATININE 0.94 01/18/2017 0832      Component Value Date/Time   CALCIUM 9.9 02/14/2019 1019   ALKPHOS 35 (L) 02/14/2019 1019   AST 9 02/14/2019 1019   ALT 17 02/14/2019 1019   BILITOT 0.4 02/14/2019 1019     Lab Results  Component Value Date   WBC 7.2 02/14/2018   HGB 13.4 02/14/2018   HCT 39.3 02/14/2018   MCV 85 02/14/2018   PLT 211.0 05/21/2014   Lab Results  Component Value Date   HGBA1C 6.1 (H) 02/14/2019   Lab Results  Component Value Date   TSH 2.030 02/14/2018   ASSESSMENT AND PLAN:  Discussed the following assessment and plan:  1) Episodic disequilibrium/fatigue: no further episodes since getting completely off lisinopril. He has lost 25 lbs purposefully recently and this may have been enough to get bp down some and NOT need the lisinopril anymore (at least at this point in time). Will d/c lisinopril altogether at this time, continue home bp and HR monitoring.  2) DM 2, good control. Checking glucoses bid every day, sometimes up to 4 times per day due to occasional need to have this information for self-titration purposes. Changed his insulin from levemir VIALS to levemir flextouch pen today per his request (56 U SQ qd).    I discussed the assessment and treatment plan with the patient.  The patient was provided an opportunity to ask questions and all were answered. The patient agreed with the plan and demonstrated an understanding of the instructions.   The patient was advised to call back or seek an in-person evaluation if the symptoms worsen or if the condition fails to improve as anticipated.  F/u: keep next [facility] f/u  Signed:  Crissie Sickles, MD           03/27/2019

## 2019-03-29 ENCOUNTER — Ambulatory Visit (INDEPENDENT_AMBULATORY_CARE_PROVIDER_SITE_OTHER): Payer: Medicare Other | Admitting: Physician Assistant

## 2019-03-29 ENCOUNTER — Encounter (INDEPENDENT_AMBULATORY_CARE_PROVIDER_SITE_OTHER): Payer: Self-pay | Admitting: Physician Assistant

## 2019-03-29 ENCOUNTER — Other Ambulatory Visit: Payer: Self-pay

## 2019-03-29 DIAGNOSIS — E119 Type 2 diabetes mellitus without complications: Secondary | ICD-10-CM

## 2019-03-29 DIAGNOSIS — Z6835 Body mass index (BMI) 35.0-35.9, adult: Secondary | ICD-10-CM

## 2019-03-30 NOTE — Progress Notes (Signed)
Office: [phone]  /  Fax: [phone] TeleHealth Visit:  [name] [name] has verbally consented to this TeleHealth visit today. The patient is located at home, the provider is located at the [facility] office. The participants in this visit include the listed provider, patient, and the patient's wife, [name]. The visit was conducted today via Webex.  HPI:   Chief Complaint: OBESITY [name] [name] is here to discuss his progress with his obesity treatment plan. He is on the Category 4 plan and is following his eating plan approximately 85% of the time. He states he is walking 15-20 minutes 2 times per week. [name] [name] reports that his weight today was 245.6 lbs. He has been more focused on the plan and has been eating out less. He reports boredom eating in the evening. We were unable to weigh the patient today for this TeleHealth visit. He feels as if he has lost 3 lbs since his last visit. He has lost 25 lbs since starting treatment with [name].  Diabetes II [name] has a diagnosis of diabetes type II and is on insulin, metformin, and Victoza. Masson states fasting blood sugars are in the range of 85 and 94. He denies any hypoglycemic episodes. Last A1c was 6.1 on 02/14/2019. He has been working on intensive lifestyle modifications including diet, exercise, and weight loss to help control his blood glucose levels. [name] [name] reports polyphagia in the evening but thinks it is more due to boredom.  ASSESSMENT AND PLAN:  Diabetes mellitus without complication (HCC)  Class 2 severe obesity with serious comorbidity and body mass index (BMI) of 35.0 to 35.9 in adult, unspecified obesity type ([place])  PLAN:  Diabetes II [name] [name] has been given extensive diabetes education by myself today including ideal fasting and post-prandial blood glucose readings, individual ideal HgA1c goals  and hypoglycemia prevention. We discussed the importance of good blood sugar control to decrease the likelihood of  diabetic complications such as nephropathy, neuropathy, limb loss, blindness, coronary artery disease, and death. We discussed the importance of intensive lifestyle modification including diet, exercise and weight loss as the first line treatment for diabetes. Adarian agrees to continue his diabetes medications, weight loss, and will follow-up at the agreed upon time.  Obesity [name] [name] is currently in the action stage of change. As such, his goal is to continue with weight loss efforts. He has agreed to follow the Category 4 plan. [name] has been instructed to work up to a goal of 150 minutes of combined cardio and strengthening exercise per week for weight loss and overall health benefits. We discussed the following Behavioral Modification Strategies today: work on meal planning, easy cooking plans, and keeping healthy foods in the home.  [name] [name] has agreed to follow-up with our clinic in 2 weeks. He was informed of the importance of frequent follow-up visits to maximize his success with intensive lifestyle modifications for his multiple health conditions.  ALLERGIES: Allergies  Allergen Reactions   Simvastatin Other (See Comments)    myalgias   Antihistamines, Diphenhydramine-Type Other (See Comments)    depression   Codeine Nausea Only   Penicillins Hives   Sulfa Antibiotics Hives    MEDICATIONS: Current Outpatient Medications on File Prior to Visit  Medication Sig Dispense Refill   aspirin 325 MG tablet Take 325 mg by mouth daily.       atorvastatin (LIPITOR) 80 MG tablet Take 1 tablet (80 mg total) by mouth daily. 90 tablet 1   Choline Fenofibrate (FENOFIBRIC ACID) 135 MG CPDR Take  1 capsule by mouth daily. 90 capsule 1   escitalopram (LEXAPRO) 20 MG tablet TAKE 1 TABLET(20 MG) BY MOUTH DAILY 30 tablet 1   finasteride (PROSCAR) 5 MG tablet TAKE 1 TABLET BY MOUTH ONCE DAILY 90 tablet 1   fluticasone (FLONASE) 50 MCG/ACT nasal spray Place 2 sprays into both nostrils daily. 48  g 1   Insulin Detemir (LEVEMIR FLEXTOUCH) 100 UNIT/ML Pen 56 U SQ once every day 15 mL 11   Insulin Pen Needle (BD PEN NEEDLE NANO U/F) 32G X 4 MM MISC USE FOR INSULIN INJECTION FOUR TIMES A DAY 100 each 12   liraglutide (VICTOZA) 18 MG/3ML SOPN Inject 0.3 mLs (1.8 mg total) into the skin every morning. 3 pen 0   MELATONIN PO Take by mouth.     metFORMIN (GLUCOPHAGE) 1000 MG tablet Take 1 tablet (1,000 mg total) by mouth 2 (two) times daily with a meal. 180 tablet 1   Naproxen Sodium (ALEVE) 220 MG CAPS Take 2 capsules by mouth at bedtime as needed.       Omega-3 Fatty Acids (FISH OIL) 1000 MG CAPS Take 1 capsule by mouth 2 (two) times daily.     ONE TOUCH ULTRA TEST test strip TEST THREE TIMES DAILY 100 each 11   SALINE NASAL SPRAY NA Place into the nose daily as needed.     tamsulosin (FLOMAX) 0.4 MG CAPS capsule Take 2 capsules (0.8 mg total) by mouth at bedtime. 180 capsule 1   vitamin B-12 (CYANOCOBALAMIN) 1000 MCG tablet Take 1,000 mcg by mouth daily.       No current facility-administered medications on file prior to visit.     PAST MEDICAL HISTORY: Past Medical History:  Diagnosis Date   Allergic rhinitis, unspecified    Allergy testing via allergist 04/2017--mostly neg.  Dr. Donneta Romberg recommended referral to ENT so i did this.   Back pain    BPH (benign prostatic hypertrophy)    Chronic nasal congestion    DDD (degenerative disc disease), lumbar 2010   laminectomy 01/2009   Depression    Hospitalized for suicidal ideation 1992.  Has been on lexapro since 10/2008.   Diabetes mellitus type II    Dx'd approximately 06/2008.  No retinopathy as of 02/2017 ophth.   Erectile dysfunction    History of colon polyps 01/2016   Recall 3 yrs   History of pneumonia 2008   Hospitalized   History of scarlet fever    age 68   Hyperlipidemia, mixed 1993   myalgias on pravastatin   Hypertension 2009   "marked chronic cardiomegaly" on CXR 01/2009 per old records.    Hypogonadism, male 01/11/2012   Axiron trial started 03/2012   Insomnia    Keratoconus    dx'd 1973   Memory changes    OSA on CPAP 2004; 2019   Back on CPAP 04/2018   Osteoarthritis of both knees    Mild plain film changes in medial compartment bilat   Restless leg    Rhinitis, chronic    Urethral stricture    dilated X 2 as a child and surgery for this (?) around 1990    PAST SURGICAL HISTORY: Past Surgical History:  Procedure Laterality Date   APPENDECTOMY  1972   COLONOSCOPY  X 4   Most recent was 2007 [place], [place]), with removal of polyps in the first two.  02/21/16: tubular adenoma.  Recall 3 yrs (Dr. Ardis Hughs).   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  ELECTROCARDIOGRAM  01/29/2009   NORMAL   LUMBAR LAMINECTOMY  01/2009   TONSILLECTOMY AND ADENOIDECTOMY     age 34   TRANSTHORACIC ECHOCARDIOGRAM  02/22/2018   EF 55-60%, grd I DD   URETHRAL DILATION     2 as a child, one as an adult   VASECTOMY  1994   VITRECTOMY      SOCIAL HISTORY: Social History   Tobacco Use   Smoking status: Former Smoker   Smokeless tobacco: Never Used  Substance Use Topics   Alcohol use: Yes    Comment: social, 3-4 drinks month   Drug use: No    FAMILY HISTORY: Family History  Problem Relation Age of Onset   Heart disease Mother    Hyperlipidemia Mother    Stroke Mother    Diabetes Mother    Depression Mother    Alzheimer's disease Mother    Stroke Father    Hyperlipidemia Father    Heart disease Father    Diabetes Father    Hypertension Father    Obesity Father    Alcohol abuse Sister    Depression Sister    ROS: Review of Systems  Endo/Heme/Allergies:       Negative for hypoglycemia.    PHYSICAL EXAM: Pt in no acute distress  RECENT LABS AND TESTS: BMET    Component Value Date/Time   NA 144 02/14/2019 1019   K 4.5 02/14/2019 1019   CL 102 02/14/2019 1019   CO2 23 02/14/2019 1019   GLUCOSE 109 (H) 02/14/2019 1019   GLUCOSE 113  (H) 08/10/2018 1033   BUN 28 (H) 02/14/2019 1019   CREATININE 0.95 02/14/2019 1019   CREATININE 0.94 01/18/2017 0832   CALCIUM 9.9 02/14/2019 1019   GFRNONAA 83 02/14/2019 1019   GFRAA 96 02/14/2019 1019   Lab Results  Component Value Date   HGBA1C 6.1 (H) 02/14/2019   HGBA1C 5.8 (A) 11/14/2018   HGBA1C 6.3 08/10/2018   HGBA1C 6.5 05/09/2018   HGBA1C 7.6 (H) 02/04/2018   No results found for: INSULIN CBC    Component Value Date/Time   WBC 7.2 02/14/2018 0848   WBC 6.2 05/21/2014 1541   RBC 4.63 02/14/2018 0848   RBC 4.45 05/21/2014 1541   HGB 13.4 02/14/2018 0848   HCT 39.3 02/14/2018 0848   PLT 211.0 05/21/2014 1541   MCV 85 02/14/2018 0848   MCH 28.9 02/14/2018 0848   MCHC 34.1 02/14/2018 0848   MCHC 33.3 05/21/2014 1541   RDW 14.1 02/14/2018 0848   LYMPHSABS 2.5 02/14/2018 0848   MONOABS 0.4 05/21/2014 1541   EOSABS 0.2 02/14/2018 0848   BASOSABS 0.0 02/14/2018 0848   Iron/TIBC/Ferritin/ %Sat No results found for: IRON, TIBC, FERRITIN, IRONPCTSAT Lipid Panel     Component Value Date/Time   CHOL 135 02/14/2019 1019   TRIG 88 02/14/2019 1019   HDL 45 02/14/2019 1019   CHOLHDL 3 08/10/2018 1033   VLDL 18.2 08/10/2018 1033   LDLCALC 72 02/14/2019 1019   LDLDIRECT 112.0 07/31/2016 1116   Hepatic Function Panel     Component Value Date/Time   PROT 6.6 02/14/2019 1019   ALBUMIN 4.5 02/14/2019 1019   AST 9 02/14/2019 1019   ALT 17 02/14/2019 1019   ALKPHOS 35 (L) 02/14/2019 1019   BILITOT 0.4 02/14/2019 1019      Component Value Date/Time   TSH 2.030 02/14/2018 0848   TSH 1.46 05/21/2014 1541   TSH 2.50 05/10/2013 2703   Results for Snare, Eulon "TOM" (  MRN [id number]) as of 03/30/2019 08:38  Ref. Range 02/14/2019 10:19  Vitamin D, 25-Hydroxy Latest Ref Range: 30.0 - 100.0 ng/mL 12.0 (L)   I, Michaelene Song, am acting as Location manager for [employer], PA-C I, Abby Potash, PA-C have reviewed above note and agree with its content

## 2019-04-13 ENCOUNTER — Encounter (INDEPENDENT_AMBULATORY_CARE_PROVIDER_SITE_OTHER): Payer: Self-pay | Admitting: Physician Assistant

## 2019-04-13 ENCOUNTER — Other Ambulatory Visit: Payer: Self-pay

## 2019-04-13 ENCOUNTER — Ambulatory Visit (INDEPENDENT_AMBULATORY_CARE_PROVIDER_SITE_OTHER): Payer: Medicare Other | Admitting: Physician Assistant

## 2019-04-13 DIAGNOSIS — E119 Type 2 diabetes mellitus without complications: Secondary | ICD-10-CM | POA: Diagnosis not present

## 2019-04-13 DIAGNOSIS — F3289 Other specified depressive episodes: Secondary | ICD-10-CM | POA: Diagnosis not present

## 2019-04-13 DIAGNOSIS — Z6835 Body mass index (BMI) 35.0-35.9, adult: Secondary | ICD-10-CM

## 2019-04-13 DIAGNOSIS — Z794 Long term (current) use of insulin: Secondary | ICD-10-CM

## 2019-04-13 MED ORDER — ESCITALOPRAM OXALATE 20 MG PO TABS: ORAL_TABLET | ORAL | 1 refills | Status: DC

## 2019-04-13 MED ORDER — LIRAGLUTIDE 18 MG/3ML ~~LOC~~ SOPN: 1.8000 mg | PEN_INJECTOR | Freq: Every morning | SUBCUTANEOUS | 0 refills | Status: DC

## 2019-04-13 NOTE — Telephone Encounter (Signed)
FYI

## 2019-04-17 NOTE — Progress Notes (Signed)
Office: [phone]  /  Fax: [phone] TeleHealth Visit:  [name] [name] has verbally consented to this TeleHealth visit today. The patient is located at home, the provider is located at the [facility] office. The participants in this visit include the listed provider and patient. The visit was conducted today via Webex.  HPI:   Chief Complaint: OBESITY [name] [name] is here to discuss his progress with his obesity treatment plan. He is on the Category 4 plan and is following his eating plan approximately 85-90% of the time. He states he is walking 15-20 minutes 2 times per week. [name] [name] reports his weight today was 245 lbs. He is still excessively snacking in the afternoon and evening and is overeating his snack calories.  We were unable to weigh the patient today for this TeleHealth visit. He states his weight today was 245.8 lbs. He has lost 25 lbs since starting treatment with [name].  Diabetes II [name] [name] has a diagnosis of diabetes type II and is on insulin, metformin, and Victoza. Brigg states fasting blood sugars range between 90 and 138. Last A1c was 6.1 on 02/14/2019. He has been working on intensive lifestyle modifications including diet, exercise, and weight loss to help control his blood glucose levels.  Depression with emotional eating behaviors [name] [name] is struggling with emotional eating and using food for comfort to the extent that it is negatively impacting his health. He often snacks when he is not hungry. [name] [name] sometimes feels he is out of control and then feels guilty that he made poor food choices. He has been working on behavior modification techniques to help reduce his emotional eating and has been somewhat successful. He shows no sign of suicidal or homicidal ideations. [name] [name] does report cravings intermittently in the evening.  Depression screen [facility] 2/9 08/10/2018 02/14/2018 02/04/2018 11/05/2017 07/08/2017  Decreased Interest 0 2 0 0 0  Down, Depressed, Hopeless  0 1 1 1  0  PHQ - 2 Score 0 3 1 1  0  Altered sleeping 1 1 3 3  -  Tired, decreased energy 2 3 1 2  -  Change in appetite 0 2 0 1 -  Feeling bad or failure about yourself  0 1 0 1 -  Trouble concentrating 1 3 0 1 -  Moving slowly or fidgety/restless 0 1 0 0 -  Suicidal thoughts 0 0 0 0 -  PHQ-9 Score 4 14 5 9  -  Difficult doing work/chores Not difficult at all Not difficult at all Not difficult at all Not difficult at all -   ASSESSMENT AND PLAN:  Type 2 diabetes mellitus without complication, with long-term current use of insulin ([place]) - Plan: liraglutide (VICTOZA) 18 MG/3ML SOPN  Other depression - with emotional eating - Plan: escitalopram (LEXAPRO) 20 MG tablet  Class 2 severe obesity with serious comorbidity and body mass index (BMI) of 35.0 to 35.9 in adult, unspecified obesity type ([place])  PLAN:  Diabetes II [name] [name] has been given extensive diabetes education by myself today including ideal fasting and post-prandial blood glucose readings, individual ideal HgA1c goals  and hypoglycemia prevention. We discussed the importance of good blood sugar control to decrease the likelihood of diabetic complications such as nephropathy, neuropathy, limb loss, blindness, coronary artery disease, and death. We discussed the importance of intensive lifestyle modification including diet, exercise and weight loss as the first line treatment for diabetes. [name] [name] was given a refill on his Victoza #3 pens with 0 refills and agrees to follow-up with our clinic in 2  weeks.  Depression with Emotional Eating Behaviors We discussed behavior modification techniques today to help [name] [name] deal with his emotional eating and depression. [name] [name] was given a refill on his Lexapro #30 with 0 refills and agrees to follow-up with our clinic in 2 weeks.  Obesity [name] [name] is currently in the action stage of change. As such, his goal is to continue with weight loss efforts. He has agreed to follow the Category 4 plan. [name] [name]  has been instructed to work up to a goal of 150 minutes of combined cardio and strengthening exercise per week for weight loss and overall health benefits. We discussed the following Behavioral Modification Strategies today: work on meal planning, easy cooking plans, and keeping healthy foods in the home.  [name] [name] has agreed to follow-up with our clinic in 2 weeks. He was informed of the importance of frequent follow-up visits to maximize his success with intensive lifestyle modifications for his multiple health conditions.  ALLERGIES: Allergies  Allergen Reactions  . Simvastatin Other (See Comments)    myalgias  . Antihistamines, Diphenhydramine-Type Other (See Comments)    depression  . Codeine Nausea Only  . Penicillins Hives  . Sulfa Antibiotics Hives    MEDICATIONS: Current Outpatient Medications on File Prior to Visit  Medication Sig Dispense Refill  . aspirin 325 MG tablet Take 325 mg by mouth daily.      [name] atorvastatin (LIPITOR) 80 MG tablet Take 1 tablet (80 mg total) by mouth daily. 90 tablet 1  . Choline Fenofibrate (FENOFIBRIC ACID) 135 MG CPDR Take 1 capsule by mouth daily. 90 capsule 1  . finasteride (PROSCAR) 5 MG tablet TAKE 1 TABLET BY MOUTH ONCE DAILY 90 tablet 1  . fluticasone (FLONASE) 50 MCG/ACT nasal spray Place 2 sprays into both nostrils daily. 48 g 1  . Insulin Detemir (LEVEMIR FLEXTOUCH) 100 UNIT/ML Pen 56 U SQ once every day 15 mL 11  . Insulin Pen Needle (BD PEN NEEDLE NANO U/F) 32G X 4 MM MISC USE FOR INSULIN INJECTION FOUR TIMES A DAY 100 each 12  . MELATONIN PO Take by mouth.    . metFORMIN (GLUCOPHAGE) 1000 MG tablet Take 1 tablet (1,000 mg total) by mouth 2 (two) times daily with a meal. 180 tablet 1  . Naproxen Sodium (ALEVE) 220 MG CAPS Take 2 capsules by mouth at bedtime as needed.      . Omega-3 Fatty Acids (FISH OIL) 1000 MG CAPS Take 1 capsule by mouth 2 (two) times daily.    . ONE TOUCH ULTRA TEST test strip TEST THREE TIMES DAILY 100 each 11  .  SALINE NASAL SPRAY NA Place into the nose daily as needed.    . tamsulosin (FLOMAX) 0.4 MG CAPS capsule Take 2 capsules (0.8 mg total) by mouth at bedtime. 180 capsule 1  . vitamin B-12 (CYANOCOBALAMIN) 1000 MCG tablet Take 1,000 mcg by mouth daily.       No current facility-administered medications on file prior to visit.     PAST MEDICAL HISTORY: Past Medical History:  Diagnosis Date  . Allergic rhinitis, unspecified    Allergy testing via allergist 04/2017--mostly neg.  Dr. Donneta Romberg recommended referral to ENT so i did this.  . Back pain   . BPH (benign prostatic hypertrophy)   . Chronic nasal congestion   . DDD (degenerative disc disease), lumbar 2010   laminectomy 01/2009  . Depression    Hospitalized for suicidal ideation 26.  Has been on lexapro since 10/2008.  . Diabetes mellitus type  II    Dx'd approximately 06/2008.  No retinopathy as of 02/2017 ophth.  . Erectile dysfunction   . History of colon polyps 01/2016   Recall 3 yrs  . History of pneumonia 2008   Hospitalized  . History of scarlet fever    age 41  . Hyperlipidemia, mixed 1993   myalgias on pravastatin  . Hypertension 2009   "marked chronic cardiomegaly" on CXR 01/2009 per old records.  . 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 Insomnia   . Keratoconus    dx'd 1973  . Memory changes   . OSA on CPAP 2004; 2019   Back on CPAP 04/2018  . Osteoarthritis of both knees    Mild plain film changes in medial compartment bilat  . Restless leg   . Rhinitis, chronic   . 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 APPENDECTOMY  1972  . COLONOSCOPY  X 4   Most recent was 2007 [place], [place]), with removal of polyps in the first two.  02/21/16: tubular adenoma.  Recall 3 yrs (Dr. Ardis Hughs).  . 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 ELECTROCARDIOGRAM  01/29/2009   NORMAL  . LUMBAR  LAMINECTOMY  01/2009  . TONSILLECTOMY AND ADENOIDECTOMY     age 25  . TRANSTHORACIC ECHOCARDIOGRAM  02/22/2018   EF 55-60%, grd I DD  . URETHRAL DILATION     2 as a child, one as an adult  . VASECTOMY  1994  . VITRECTOMY      SOCIAL HISTORY: Social History   Tobacco Use  . Smoking status: Former Research scientist (life sciences)  . Smokeless tobacco: Never Used  Substance Use Topics  . Alcohol use: Yes    Comment: social, 3-4 drinks month  . Drug use: No    FAMILY HISTORY: Family History  Problem Relation Age of Onset  . Heart disease Mother   . Hyperlipidemia Mother   . Stroke Mother   . Diabetes Mother   . Depression Mother   . Alzheimer's disease Mother   . Stroke Father   . Hyperlipidemia Father   . Heart disease Father   . Diabetes Father   . Hypertension Father   . Obesity Father   . Alcohol abuse Sister   . Depression Sister    ROS: Review of Systems  Psychiatric/Behavioral: Positive for depression (emotional eating). Negative for suicidal ideas.       Negative for homicidal ideas.   PHYSICAL EXAM: Pt in no acute distress  RECENT LABS AND TESTS: BMET    Component Value Date/Time   NA 144 02/14/2019 1019   K 4.5 02/14/2019 1019   CL 102 02/14/2019 1019   CO2 23 02/14/2019 1019   GLUCOSE 109 (H) 02/14/2019 1019   GLUCOSE 113 (H) 08/10/2018 1033   BUN 28 (H) 02/14/2019 1019   CREATININE 0.95 02/14/2019 1019   CREATININE 0.94 01/18/2017 0832   CALCIUM 9.9 02/14/2019 1019   GFRNONAA 83 02/14/2019 1019   GFRAA 96 02/14/2019 1019   Lab Results  Component Value Date   HGBA1C 6.1 (H) 02/14/2019   HGBA1C 5.8 (A) 11/14/2018   HGBA1C 6.3 08/10/2018   HGBA1C 6.5 05/09/2018   HGBA1C 7.6 (H) 02/04/2018   No results found for: INSULIN CBC    Component Value Date/Time   WBC 7.2 02/14/2018 0848  WBC 6.2 05/21/2014 1541   RBC 4.63 02/14/2018 0848   RBC 4.45 05/21/2014 1541   HGB 13.4 02/14/2018 0848   HCT 39.3 02/14/2018 0848   PLT 211.0 05/21/2014 1541   MCV 85  02/14/2018 0848   MCH 28.9 02/14/2018 0848   MCHC 34.1 02/14/2018 0848   MCHC 33.3 05/21/2014 1541   RDW 14.1 02/14/2018 0848   LYMPHSABS 2.5 02/14/2018 0848   MONOABS 0.4 05/21/2014 1541   EOSABS 0.2 02/14/2018 0848   BASOSABS 0.0 02/14/2018 0848   Iron/TIBC/Ferritin/ %Sat No results found for: IRON, TIBC, FERRITIN, IRONPCTSAT Lipid Panel     Component Value Date/Time   CHOL 135 02/14/2019 1019   TRIG 88 02/14/2019 1019   HDL 45 02/14/2019 1019   CHOLHDL 3 08/10/2018 1033   VLDL 18.2 08/10/2018 1033   LDLCALC 72 02/14/2019 1019   LDLDIRECT 112.0 07/31/2016 1116   Hepatic Function Panel     Component Value Date/Time   PROT 6.6 02/14/2019 1019   ALBUMIN 4.5 02/14/2019 1019   AST 9 02/14/2019 1019   ALT 17 02/14/2019 1019   ALKPHOS 35 (L) 02/14/2019 1019   BILITOT 0.4 02/14/2019 1019      Component Value Date/Time   TSH 2.030 02/14/2018 0848   TSH 1.46 05/21/2014 1541   TSH 2.50 05/10/2013 0832   Results for MANNY, VITOLO "TOM" (MRN [id number]) as of 04/17/2019 09:32  Ref. Range 02/14/2019 10:19  Vitamin D, 25-Hydroxy Latest Ref Range: 30.0 - 100.0 ng/mL 12.0 (L)    I, Michaelene Song, am acting as Location manager for [employer], PA-C I, Abby Potash, PA-C have reviewed above note and agree with its content

## 2019-05-01 ENCOUNTER — Ambulatory Visit (INDEPENDENT_AMBULATORY_CARE_PROVIDER_SITE_OTHER): Payer: Medicare Other | Admitting: Physician Assistant

## 2019-05-01 ENCOUNTER — Encounter (INDEPENDENT_AMBULATORY_CARE_PROVIDER_SITE_OTHER): Payer: Self-pay | Admitting: Physician Assistant

## 2019-05-01 ENCOUNTER — Other Ambulatory Visit: Payer: Self-pay

## 2019-05-01 DIAGNOSIS — E559 Vitamin D deficiency, unspecified: Secondary | ICD-10-CM | POA: Diagnosis not present

## 2019-05-01 DIAGNOSIS — Z6835 Body mass index (BMI) 35.0-35.9, adult: Secondary | ICD-10-CM

## 2019-05-01 DIAGNOSIS — Z794 Long term (current) use of insulin: Secondary | ICD-10-CM | POA: Diagnosis not present

## 2019-05-01 DIAGNOSIS — E119 Type 2 diabetes mellitus without complications: Secondary | ICD-10-CM

## 2019-05-01 MED ORDER — VITAMIN D (ERGOCALCIFEROL) 1.25 MG (50000 UNIT) PO CAPS: 50000.0000 [IU] | ORAL_CAPSULE | ORAL | 0 refills | Status: DC

## 2019-05-01 MED ORDER — LIRAGLUTIDE 18 MG/3ML ~~LOC~~ SOPN: 1.8000 mg | PEN_INJECTOR | Freq: Every morning | SUBCUTANEOUS | 0 refills | Status: DC

## 2019-05-01 NOTE — Progress Notes (Signed)
Office: [phone]  /  Fax: [phone] TeleHealth Visit:  [name] [name] has verbally consented to this TeleHealth visit today. The patient is located at home, the provider is located at the [facility] office. The participants in this visit include the listed provider and patient. The visit was conducted today via webex.  HPI:   Chief Complaint: OBESITY [name] [name] is here to discuss his progress with his obesity treatment plan. He is on the  follow the Category 4 plan and is following his eating plan approximately 0 % of the time (unsure). He states he is exercising 0 minutes 0 times per week. [name] [name]'s most recent weight is 243 lbs. He states that he is still snacking a lot in the afternoon and after dinner. Otherwise, he is doing very well on the plan. He states his blood pressure is 117/71. We were unable to weigh the patient today for this TeleHealth visit. He feels as if he has lost 2 lbs since his last visit. He has lost 25-27 lbs since starting treatment with [name].  Diabetes II [name] [name] has a diagnosis of diabetes type II. [name] [name] is on metformin and Victoza, and he denies nausea, vomiting, or diarrhea. He states his BGs is 111 and he denies polyphagia or hypoglycemia. Last A1c was 6.1. He has been working on intensive lifestyle modifications including diet, exercise, and weight loss to help control his blood glucose levels.  Vitamin D Deficiency [name] [name] has a diagnosis of vitamin D deficiency. He is not currently taking Vit D. He denies nausea, vomiting or muscle weakness.  ASSESSMENT AND PLAN:  Type 2 diabetes mellitus without complication, with long-term current use of insulin ([place]) - Plan: liraglutide (VICTOZA) 18 MG/3ML SOPN  Vitamin D deficiency - Plan: Vitamin D, Ergocalciferol, (DRISDOL) 1.25 MG (50000 UT) CAPS capsule  Class 2 severe obesity with serious comorbidity and body mass index (BMI) of 35.0 to 35.9 in adult, unspecified obesity type ([place])  PLAN:   Diabetes II [name] [name] has been given extensive diabetes education by myself today including ideal fasting and post-prandial blood glucose readings, individual ideal Hgb A1c goals and hypoglycemia prevention. We discussed the importance of good blood sugar control to decrease the likelihood of diabetic complications such as nephropathy, neuropathy, limb loss, blindness, coronary artery disease, and death. We discussed the importance of intensive lifestyle modification including diet, exercise and weight loss as the first line treatment for diabetes. [name] [name] agrees to continue taking metformin, and he agrees to continue Victoza 1.8 mg SubQ AM #3 pens and we will refill for 1 month. [name] [name] agrees to follow up with our clinic in 2 weeks.  Vitamin D Deficiency [name] [name] was informed that low vitamin D levels contributes to fatigue and are associated with obesity, breast, and colon cancer. [name] [name] agrees to start prescription Vit D @50 ,000 IU every week #4 with no refills. He will follow up for routine testing of vitamin D, at least 2-3 times per year. He was informed of the risk of over-replacement of vitamin D and agrees to not increase his dose unless he discusses this with [name] first. [name] agrees to follow up with our clinic in 2 weeks.  Obesity [name] [name] is currently in the action stage of change. As such, his goal is to continue with weight loss efforts He has agreed to follow the Category 4 plan [name] [name] has been instructed to work up to a goal of 150 minutes of combined cardio and strengthening exercise per week for weight loss and overall health benefits.  We discussed the following Behavioral Modification Strategies today: work on meal planning and easy cooking plans and ways to avoid night time snacking   [name] [name] has agreed to follow up with our clinic in 2 weeks. He was informed of the importance of frequent follow up visits to maximize his success with intensive lifestyle modifications for his multiple health  conditions.  ALLERGIES: Allergies  Allergen Reactions  . Simvastatin Other (See Comments)    myalgias  . Antihistamines, Diphenhydramine-Type Other (See Comments)    depression  . Codeine Nausea Only  . Penicillins Hives  . Sulfa Antibiotics Hives    MEDICATIONS: Current Outpatient Medications on File Prior to Visit  Medication Sig Dispense Refill  . aspirin 325 MG tablet Take 325 mg by mouth daily.      [name] [name] atorvastatin (LIPITOR) 80 MG tablet Take 1 tablet (80 mg total) by mouth daily. 90 tablet 1  . Choline Fenofibrate (FENOFIBRIC ACID) 135 MG CPDR Take 1 capsule by mouth daily. 90 capsule 1  . escitalopram (LEXAPRO) 20 MG tablet TAKE 1 TABLET(20 MG) BY MOUTH DAILY 30 tablet 1  . finasteride (PROSCAR) 5 MG tablet TAKE 1 TABLET BY MOUTH ONCE DAILY 90 tablet 1  . fluticasone (FLONASE) 50 MCG/ACT nasal spray Place 2 sprays into both nostrils daily. 48 g 1  . Insulin Detemir (LEVEMIR FLEXTOUCH) 100 UNIT/ML Pen 56 U SQ once every day 15 mL 11  . Insulin Pen Needle (BD PEN NEEDLE NANO U/F) 32G X 4 MM MISC USE FOR INSULIN INJECTION FOUR TIMES A DAY 100 each 12  . MELATONIN PO Take by mouth.    . metFORMIN (GLUCOPHAGE) 1000 MG tablet Take 1 tablet (1,000 mg total) by mouth 2 (two) times daily with a meal. 180 tablet 1  . Naproxen Sodium (ALEVE) 220 MG CAPS Take 2 capsules by mouth at bedtime as needed.      . Omega-3 Fatty Acids (FISH OIL) 1000 MG CAPS Take 1 capsule by mouth 2 (two) times daily.    . ONE TOUCH ULTRA TEST test strip TEST THREE TIMES DAILY 100 each 11  . SALINE NASAL SPRAY NA Place into the nose daily as needed.    . tamsulosin (FLOMAX) 0.4 MG CAPS capsule Take 2 capsules (0.8 mg total) by mouth at bedtime. 180 capsule 1  . vitamin B-12 (CYANOCOBALAMIN) 1000 MCG tablet Take 1,000 mcg by mouth daily.       No current facility-administered medications on file prior to visit.     PAST MEDICAL HISTORY: Past Medical History:  Diagnosis Date  . Allergic rhinitis, unspecified     Allergy testing via allergist 04/2017--mostly neg.  Dr. Donneta Romberg recommended referral to ENT so i did this.  . Back pain   . BPH (benign prostatic hypertrophy)   . Chronic nasal congestion   . DDD (degenerative disc disease), lumbar 2010   laminectomy 01/2009  . Depression    Hospitalized for suicidal ideation 84.  Has been on lexapro since 10/2008.  . Diabetes mellitus type II    Dx'd approximately 06/2008.  No retinopathy as of 02/2017 ophth.  . Erectile dysfunction   . History of colon polyps 01/2016   Recall 3 yrs  . History of pneumonia 2008   Hospitalized  . History of scarlet fever    age 42  . Hyperlipidemia, mixed 1993   myalgias on pravastatin  . Hypertension 2009   "marked chronic cardiomegaly" on CXR 01/2009 per old records.  . Hypogonadism, male 01/11/2012  Axiron trial started 03/2012  . Insomnia   . Keratoconus    dx'd 1973  . Memory changes   . OSA on CPAP 2004; 2019   Back on CPAP 04/2018  . Osteoarthritis of both knees    Mild plain film changes in medial compartment bilat  . Restless leg   . Rhinitis, chronic   . 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 APPENDECTOMY  1972  . COLONOSCOPY  X 4   Most recent was 2007 [place], [place]), with removal of polyps in the first two.  02/21/16: tubular adenoma.  Recall 3 yrs (Dr. Ardis Hughs).  . 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 ELECTROCARDIOGRAM  01/29/2009   NORMAL  . LUMBAR LAMINECTOMY  01/2009  . TONSILLECTOMY AND ADENOIDECTOMY     age 65  . TRANSTHORACIC ECHOCARDIOGRAM  02/22/2018   EF 55-60%, grd I DD  . URETHRAL DILATION     2 as a child, one as an adult  . VASECTOMY  1994  . VITRECTOMY      SOCIAL HISTORY: Social History   Tobacco Use  . Smoking status: Former Research scientist (life sciences)  . Smokeless tobacco: Never Used  Substance Use Topics  . Alcohol use: Yes    Comment: social, 3-4 drinks month   . Drug use: No    FAMILY HISTORY: Family History  Problem Relation Age of Onset  . Heart disease Mother   . Hyperlipidemia Mother   . Stroke Mother   . Diabetes Mother   . Depression Mother   . Alzheimer's disease Mother   . Stroke Father   . Hyperlipidemia Father   . Heart disease Father   . Diabetes Father   . Hypertension Father   . Obesity Father   . Alcohol abuse Sister   . Depression Sister     ROS: Review of Systems  Constitutional: Positive for weight loss.  Gastrointestinal: Negative for diarrhea, nausea and vomiting.  Musculoskeletal:       Negative muscle weakness  Endo/Heme/Allergies:       Negative polyphagia Negative hypoglycemia    PHYSICAL EXAM: Pt in no acute distress  RECENT LABS AND TESTS: BMET    Component Value Date/Time   NA 144 02/14/2019 1019   K 4.5 02/14/2019 1019   CL 102 02/14/2019 1019   CO2 23 02/14/2019 1019   GLUCOSE 109 (H) 02/14/2019 1019   GLUCOSE 113 (H) 08/10/2018 1033   BUN 28 (H) 02/14/2019 1019   CREATININE 0.95 02/14/2019 1019   CREATININE 0.94 01/18/2017 0832   CALCIUM 9.9 02/14/2019 1019   GFRNONAA 83 02/14/2019 1019   GFRAA 96 02/14/2019 1019   Lab Results  Component Value Date   HGBA1C 6.1 (H) 02/14/2019   HGBA1C 5.8 (A) 11/14/2018   HGBA1C 6.3 08/10/2018   HGBA1C 6.5 05/09/2018   HGBA1C 7.6 (H) 02/04/2018   No results found for: INSULIN CBC    Component Value Date/Time   WBC 7.2 02/14/2018 0848   WBC 6.2 05/21/2014 1541   RBC 4.63 02/14/2018 0848   RBC 4.45 05/21/2014 1541   HGB 13.4 02/14/2018 0848   HCT 39.3 02/14/2018 0848   PLT 211.0 05/21/2014 1541   MCV 85 02/14/2018 0848   MCH 28.9 02/14/2018 0848   MCHC 34.1 02/14/2018 0848   MCHC 33.3 05/21/2014 1541   RDW 14.1 02/14/2018  0848   LYMPHSABS 2.5 02/14/2018 0848   MONOABS 0.4 05/21/2014 1541   EOSABS 0.2 02/14/2018 0848   BASOSABS 0.0 02/14/2018 0848   Iron/TIBC/Ferritin/ %Sat No results found for: IRON, TIBC, FERRITIN, IRONPCTSAT  Lipid Panel     Component Value Date/Time   CHOL 135 02/14/2019 1019   TRIG 88 02/14/2019 1019   HDL 45 02/14/2019 1019   CHOLHDL 3 08/10/2018 1033   VLDL 18.2 08/10/2018 1033   LDLCALC 72 02/14/2019 1019   LDLDIRECT 112.0 07/31/2016 1116   Hepatic Function Panel     Component Value Date/Time   PROT 6.6 02/14/2019 1019   ALBUMIN 4.5 02/14/2019 1019   AST 9 02/14/2019 1019   ALT 17 02/14/2019 1019   ALKPHOS 35 (L) 02/14/2019 1019   BILITOT 0.4 02/14/2019 1019      Component Value Date/Time   TSH 2.030 02/14/2018 0848   TSH 1.46 05/21/2014 1541   TSH 2.50 05/10/2013 0832      I, Trixie Dredge, am acting as transcriptionist for Abby Potash, PA-C I, Abby Potash, PA-C have reviewed above note and agree with its content

## 2019-05-15 ENCOUNTER — Encounter (INDEPENDENT_AMBULATORY_CARE_PROVIDER_SITE_OTHER): Payer: Self-pay | Admitting: Physician Assistant

## 2019-05-15 ENCOUNTER — Ambulatory Visit (INDEPENDENT_AMBULATORY_CARE_PROVIDER_SITE_OTHER): Payer: Medicare Other | Admitting: Physician Assistant

## 2019-05-15 ENCOUNTER — Other Ambulatory Visit: Payer: Self-pay

## 2019-05-15 DIAGNOSIS — Z6835 Body mass index (BMI) 35.0-35.9, adult: Secondary | ICD-10-CM

## 2019-05-15 DIAGNOSIS — E119 Type 2 diabetes mellitus without complications: Secondary | ICD-10-CM

## 2019-05-15 DIAGNOSIS — E559 Vitamin D deficiency, unspecified: Secondary | ICD-10-CM | POA: Diagnosis not present

## 2019-05-15 DIAGNOSIS — Z794 Long term (current) use of insulin: Secondary | ICD-10-CM

## 2019-05-15 MED ORDER — SAXENDA 18 MG/3ML ~~LOC~~ SOPN: 3.0000 mg | PEN_INJECTOR | Freq: Every day | SUBCUTANEOUS | 0 refills | Status: DC

## 2019-05-15 MED ORDER — VITAMIN D (ERGOCALCIFEROL) 1.25 MG (50000 UNIT) PO CAPS: 50000.0000 [IU] | ORAL_CAPSULE | ORAL | 0 refills | Status: DC

## 2019-05-16 DIAGNOSIS — H2511 Age-related nuclear cataract, right eye: Secondary | ICD-10-CM | POA: Diagnosis not present

## 2019-05-16 DIAGNOSIS — E113291 Type 2 diabetes mellitus with mild nonproliferative diabetic retinopathy without macular edema, right eye: Secondary | ICD-10-CM | POA: Diagnosis not present

## 2019-05-16 DIAGNOSIS — H35352 Cystoid macular degeneration, left eye: Secondary | ICD-10-CM | POA: Diagnosis not present

## 2019-05-16 DIAGNOSIS — E113292 Type 2 diabetes mellitus with mild nonproliferative diabetic retinopathy without macular edema, left eye: Secondary | ICD-10-CM | POA: Diagnosis not present

## 2019-05-16 LAB — HM DIABETES EYE EXAM

## 2019-05-17 ENCOUNTER — Other Ambulatory Visit (HOSPITAL_COMMUNITY): Payer: Self-pay | Admitting: Neurosurgery

## 2019-05-17 ENCOUNTER — Other Ambulatory Visit: Payer: Self-pay | Admitting: Neurosurgery

## 2019-05-17 DIAGNOSIS — M5416 Radiculopathy, lumbar region: Secondary | ICD-10-CM | POA: Diagnosis not present

## 2019-05-17 DIAGNOSIS — M47816 Spondylosis without myelopathy or radiculopathy, lumbar region: Secondary | ICD-10-CM | POA: Diagnosis not present

## 2019-05-17 DIAGNOSIS — M545 Low back pain: Secondary | ICD-10-CM | POA: Diagnosis not present

## 2019-05-17 NOTE — Progress Notes (Signed)
Office: [phone]  /  Fax: [phone] TeleHealth Visit:  [name] [name] has verbally consented to this TeleHealth visit today. The patient is located at home, the provider is located at the [facility] office. The participants in this visit include the listed provider and patient. The visit was conducted today via Webex.  HPI:   Chief Complaint: OBESITY [name] [name] is here to discuss his progress with his obesity treatment plan. He is on the Category 4 plan and is following his eating plan approximately 75% of the time. He states he is working in his garden 1 hour 1 time per week. Zenon reports his weight today to be 249 lbs. He reports doing a lot of emotional eating with current world events and reports overeating his snack calories. We were unable to weigh the patient today for this TeleHealth visit. He feels as if he has gained weight since his last visit. He has lost 25 lbs since starting treatment with [name].  Vitamin D deficiency [name] has a diagnosis of Vitamin D deficiency. He is currently taking prescription Vit D and denies nausea, vomiting or muscle weakness.  Diabetes II [name] has a diagnosis of diabetes type II and is on Victoza and metformin. [name] does not report checking his blood sugars. He denies any hypoglycemic episodes. Last A1c was 6.1 on 02/14/2019. He has been working on intensive lifestyle modifications including diet, exercise, and weight loss to help control his blood glucose levels. No nausea, vomiting, or diarrhea. He does report polyphagia.  ASSESSMENT AND PLAN:  Vitamin D deficiency - Plan: Vitamin D, Ergocalciferol, (DRISDOL) 1.25 MG (50000 UT) CAPS capsule  Class 2 severe obesity with serious comorbidity and body mass index (BMI) of 35.0 to 35.9 in adult, unspecified obesity type ([place]) - Plan: Liraglutide -Weight Management (SAXENDA) 18 MG/3ML SOPN  PLAN:  Vitamin D Deficiency [name] was informed that low Vitamin D levels  contributes to fatigue and are associated with obesity, breast, and colon cancer. He agrees to continue to take prescription Vit D @ 50,000 IU every week #4 with 0 refills and will follow-up for routine testing of Vitamin D, at least 2-3 times per year. He was informed of the risk of over-replacement of Vitamin D and agrees to not increase his dose unless he discusses this with [name] first. [name] agrees to follow-up with our clinic in 2 weeks.  Diabetes II [name] has been given extensive diabetes education by myself today including ideal fasting and post-prandial blood glucose readings, individual ideal HgA1c goals  and hypoglycemia prevention. We discussed the importance of good blood sugar control to decrease the likelihood of diabetic complications such as nephropathy, neuropathy, limb loss, blindness, coronary artery disease, and death. We discussed the importance of intensive lifestyle modification including diet, exercise and weight loss as the first line treatment for diabetes. [name] will discontinue Victoza and start Saxenda 2.4 mg #5 pens with 0 refills. He agrees to follow-up with our clinic in 2 weeks.  Obesity [name] is currently in the action stage of change. As such, his goal is to continue with weight loss efforts. He has agreed to follow the Category 4 plan. [name] has been instructed to work up to a goal of 150 minutes of combined cardio and strengthening exercise per week for weight loss and overall health benefits. We discussed the following Behavioral Modification Strategies today: work on meal planning, easy cooking plans, and keeping healthy foods in the home.  [name] has agreed to follow-up with our clinic in  2 weeks. He was informed of the importance of frequent follow-up visits to maximize his success with intensive lifestyle modifications for his multiple health conditions.  ALLERGIES: Allergies  Allergen Reactions   Simvastatin Other (See Comments)    myalgias    Antihistamines, Diphenhydramine-Type Other (See Comments)    depression   Codeine Nausea Only   Penicillins Hives   Sulfa Antibiotics Hives    MEDICATIONS: Current Outpatient Medications on File Prior to Visit  Medication Sig Dispense Refill   aspirin 325 MG tablet Take 325 mg by mouth daily.       Choline Fenofibrate (FENOFIBRIC ACID) 135 MG CPDR Take 1 capsule by mouth daily. 90 capsule 1   escitalopram (LEXAPRO) 20 MG tablet TAKE 1 TABLET(20 MG) BY MOUTH DAILY 30 tablet 1   finasteride (PROSCAR) 5 MG tablet TAKE 1 TABLET BY MOUTH ONCE DAILY 90 tablet 1   fluticasone (FLONASE) 50 MCG/ACT nasal spray Place 2 sprays into both nostrils daily. 48 g 1   Insulin Detemir (LEVEMIR FLEXTOUCH) 100 UNIT/ML Pen 56 U SQ once every day 15 mL 11   Insulin Pen Needle (BD PEN NEEDLE NANO U/F) 32G X 4 MM MISC USE FOR INSULIN INJECTION FOUR TIMES A DAY 100 each 12   MELATONIN PO Take by mouth.     metFORMIN (GLUCOPHAGE) 1000 MG tablet Take 1 tablet (1,000 mg total) by mouth 2 (two) times daily with a meal. 180 tablet 1   Naproxen Sodium (ALEVE) 220 MG CAPS Take 2 capsules by mouth at bedtime as needed.       Omega-3 Fatty Acids (FISH OIL) 1000 MG CAPS Take 1 capsule by mouth 2 (two) times daily.     ONE TOUCH ULTRA TEST test strip TEST THREE TIMES DAILY 100 each 11   SALINE NASAL SPRAY NA Place into the nose daily as needed.     tamsulosin (FLOMAX) 0.4 MG CAPS capsule Take 2 capsules (0.8 mg total) by mouth at bedtime. 180 capsule 1   vitamin B-12 (CYANOCOBALAMIN) 1000 MCG tablet Take 1,000 mcg by mouth daily.       atorvastatin (LIPITOR) 80 MG tablet Take 1 tablet (80 mg total) by mouth daily. (Patient not taking: Reported on 05/15/2019) 90 tablet 1   No current facility-administered medications on file prior to visit.     PAST MEDICAL HISTORY: Past Medical History:  Diagnosis Date   Allergic rhinitis, unspecified    Allergy testing via allergist 04/2017--mostly neg.  Dr. Donneta Romberg  recommended referral to ENT so i did this.   Back pain    BPH (benign prostatic hypertrophy)    Chronic nasal congestion    DDD (degenerative disc disease), lumbar 2010   laminectomy 01/2009   Depression    Hospitalized for suicidal ideation 1992.  Has been on lexapro since 10/2008.   Diabetes mellitus type II    Dx'd approximately 06/2008.  No retinopathy as of 02/2017 ophth.   Erectile dysfunction    History of colon polyps 01/2016   Recall 3 yrs   History of pneumonia 2008   Hospitalized   History of scarlet fever    age 41   Hyperlipidemia, mixed 1993   myalgias on pravastatin   Hypertension 2009   "marked chronic cardiomegaly" on CXR 01/2009 per old records.   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  02/21/16: tubular adenoma.  Recall 3 yrs (Dr. Ardis Hughs).   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   ELECTROCARDIOGRAM  01/29/2009   NORMAL   LUMBAR LAMINECTOMY  01/2009   TONSILLECTOMY AND ADENOIDECTOMY     age 51   TRANSTHORACIC ECHOCARDIOGRAM  02/22/2018   EF 55-60%, grd I DD   URETHRAL DILATION     2 as a child, one as an adult   VASECTOMY  1994   VITRECTOMY      SOCIAL HISTORY: Social History   Tobacco Use   Smoking status: Former Smoker   Smokeless tobacco: Never Used  Substance Use Topics   Alcohol use: Yes    Comment: social, 3-4 drinks month   Drug use: No    FAMILY HISTORY: Family History  Problem  Relation Age of Onset   Heart disease Mother    Hyperlipidemia Mother    Stroke Mother    Diabetes Mother    Depression Mother    Alzheimer's disease Mother    Stroke Father    Hyperlipidemia Father    Heart disease Father    Diabetes Father    Hypertension Father    Obesity Father    Alcohol abuse Sister    Depression Sister    ROS: Review of Systems  Gastrointestinal: Negative for diarrhea, nausea and vomiting.  Musculoskeletal:       Negative for muscle weakness.  Endo/Heme/Allergies:       Negative for hypoglycemia. Positive for polyphagia.   PHYSICAL EXAM: Pt in no acute distress  RECENT LABS AND TESTS: BMET    Component Value Date/Time   NA 144 02/14/2019 1019   K 4.5 02/14/2019 1019   CL 102 02/14/2019 1019   CO2 23 02/14/2019 1019   GLUCOSE 109 (H) 02/14/2019 1019   GLUCOSE 113 (H) 08/10/2018 1033   BUN 28 (H) 02/14/2019 1019   CREATININE 0.95 02/14/2019 1019   CREATININE 0.94 01/18/2017 0832   CALCIUM 9.9 02/14/2019 1019   GFRNONAA 83 02/14/2019 1019   GFRAA 96 02/14/2019 1019   Lab Results  Component Value Date   HGBA1C 6.1 (H) 02/14/2019   HGBA1C 5.8 (A) 11/14/2018   HGBA1C 6.3 08/10/2018   HGBA1C 6.5 05/09/2018   HGBA1C 7.6 (H) 02/04/2018   No results found for: INSULIN CBC    Component Value Date/Time   WBC 7.2 02/14/2018 0848   WBC 6.2 05/21/2014 1541   RBC 4.63 02/14/2018 0848   RBC 4.45 05/21/2014 1541   HGB 13.4 02/14/2018 0848   HCT 39.3 02/14/2018 0848   PLT 211.0 05/21/2014 1541   MCV 85 02/14/2018 0848   MCH 28.9 02/14/2018 0848   MCHC 34.1 02/14/2018 0848   MCHC 33.3 05/21/2014 1541   RDW 14.1 02/14/2018 0848   LYMPHSABS 2.5 02/14/2018 0848   MONOABS 0.4 05/21/2014 1541   EOSABS 0.2 02/14/2018 0848   BASOSABS 0.0 02/14/2018 0848   Iron/TIBC/Ferritin/ %Sat No results found for: IRON, TIBC,  FERRITIN, IRONPCTSAT Lipid Panel     Component Value Date/Time   CHOL 135 02/14/2019 1019   TRIG 88 02/14/2019 1019    HDL 45 02/14/2019 1019   CHOLHDL 3 08/10/2018 1033   VLDL 18.2 08/10/2018 1033   LDLCALC 72 02/14/2019 1019   LDLDIRECT 112.0 07/31/2016 1116   Hepatic Function Panel     Component Value Date/Time   PROT 6.6 02/14/2019 1019   ALBUMIN 4.5 02/14/2019 1019   AST 9 02/14/2019 1019   ALT 17 02/14/2019 1019   ALKPHOS 35 (L) 02/14/2019 1019   BILITOT 0.4 02/14/2019 1019      Component Value Date/Time   TSH 2.030 02/14/2018 0848   TSH 1.46 05/21/2014 1541   TSH 2.50 05/10/2013 0832    Results for NAYTHEN, HEIKKILA "TOM" (MRN [id number]) as of 05/17/2019 11:07  Ref. Range 02/14/2019 10:19  Vitamin D, 25-Hydroxy Latest Ref Range: 30.0 - 100.0 ng/mL 12.0 (L)    I, Michaelene Song, am acting as Location manager for [employer], PA-C I, Abby Potash, PA-C have reviewed above note and agree with its conten

## 2019-05-18 ENCOUNTER — Encounter: Payer: Self-pay | Admitting: Family Medicine

## 2019-05-18 ENCOUNTER — Other Ambulatory Visit: Payer: Self-pay

## 2019-05-18 ENCOUNTER — Ambulatory Visit (INDEPENDENT_AMBULATORY_CARE_PROVIDER_SITE_OTHER): Payer: Medicare Other | Admitting: Family Medicine

## 2019-05-18 VITALS — BP 96/58 | HR 86 | Wt 247.2 lb

## 2019-05-18 DIAGNOSIS — E118 Type 2 diabetes mellitus with unspecified complications: Secondary | ICD-10-CM

## 2019-05-18 DIAGNOSIS — I10 Essential (primary) hypertension: Secondary | ICD-10-CM

## 2019-05-18 DIAGNOSIS — G8929 Other chronic pain: Secondary | ICD-10-CM | POA: Diagnosis not present

## 2019-05-18 DIAGNOSIS — Z125 Encounter for screening for malignant neoplasm of prostate: Secondary | ICD-10-CM | POA: Diagnosis not present

## 2019-05-18 DIAGNOSIS — M545 Low back pain: Secondary | ICD-10-CM

## 2019-05-18 DIAGNOSIS — E7800 Pure hypercholesterolemia, unspecified: Secondary | ICD-10-CM | POA: Diagnosis not present

## 2019-05-18 NOTE — Progress Notes (Signed)
Virtual Visit via Video Note  I connected with pt on 05/18/19 at 10:00 AM EDT by a video enabled telemedicine application and verified that I am speaking with the correct person using two identifiers.  Location patient: home Location provider:work or home office Persons participating in the virtual visit: patient, provider  I discussed the limitations of evaluation and management by telemedicine and the availability of in person appointments. The patient expressed understanding and agreed to proceed.  Telemedicine visit is a necessity given the COVID-19 restrictions in place at the current time.  HPI: 68 y/o WM being seen today for 3 mo f/u DM, HTN, HLD. He continues to be followed by the nonsurgical bariatric clinic for wt loss mgmt.   DM: recent diab eye exam w/out changes. Glucoses usually 90s to 115.   56 U levemir, also on highest dose levemir and max dose metformin. Says bariatric provider may be switching him to a different GLP-1 inhib.  HTN: only occ bp checks and these are normal.  HLD: tolerating statin.  Additionally, I referred him to Dr. Vertell Limber in [place] last visit for significant LB/SI joint/bilat hamstring pain that I thought may be coming from spinal stenosis.  He has an MRI scheduled for next week.  Dr. Vertell Limber thinks it may be lumbar spondylosis/stenosis.  He rx'd gabapentin but pt hasn't picked it up yet.  ROS: See pertinent positives and negatives per HPI.  Past Medical History:  Diagnosis Date  . Allergic rhinitis, unspecified    Allergy testing via allergist 04/2017--mostly neg.  Dr. Donneta Romberg recommended referral to ENT so i did this.  . Back pain   . BPH (benign prostatic hypertrophy)   . Chronic nasal congestion   . DDD (degenerative disc disease), lumbar 2010   laminectomy 01/2009  . Depression    Hospitalized for suicidal ideation 30.  Has been on lexapro since 10/2008.  . Diabetes mellitus type II    Dx'd approximately 06/2008.  No retinopathy as of 02/2017  ophth.  . Erectile dysfunction   . History of colon polyps 01/2016   Recall 3 yrs  . History of pneumonia 2008   Hospitalized  . History of scarlet fever    age 81  . Hyperlipidemia, mixed 1993   myalgias on pravastatin  . Hypertension 2009   "marked chronic cardiomegaly" on CXR 01/2009 per old records.  . 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 Insomnia   . Keratoconus    dx'd 1973  . Memory changes   . OSA on CPAP 2004; 2019   Back on CPAP 04/2018  . Osteoarthritis of both knees    Mild plain film changes in medial compartment bilat  . Restless leg   . Rhinitis, chronic   . 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 APPENDECTOMY  1972  . COLONOSCOPY  X 4   Most recent was 2007 [place], [place]), with removal of polyps in the first two.  02/21/16: tubular adenoma.  Recall 3 yrs (Dr. Ardis Hughs).  . 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 ELECTROCARDIOGRAM  01/29/2009   NORMAL  . LUMBAR LAMINECTOMY  01/2009  . TONSILLECTOMY AND ADENOIDECTOMY     age 65  . TRANSTHORACIC ECHOCARDIOGRAM  02/22/2018   EF 55-60%, grd I DD  . URETHRAL DILATION     2 as a child, one  as an adult  . VASECTOMY  1994  . VITRECTOMY      Family History  Problem Relation Age of Onset  . Heart disease Mother   . Hyperlipidemia Mother   . Stroke Mother   . Diabetes Mother   . Depression Mother   . Alzheimer's disease Mother   . Stroke Father   . Hyperlipidemia Father   . Heart disease Father   . Diabetes Father   . Hypertension Father   . Obesity Father   . Alcohol abuse Sister   . Depression Sister      Current Outpatient Medications:  .  aspirin 325 MG tablet, Take 325 mg by mouth daily.  , Disp: , Rfl:  .  atorvastatin (LIPITOR) 80 MG tablet, Take 1 tablet (80 mg total) by mouth daily., Disp: 90 tablet, Rfl: 1 .  Choline Fenofibrate (FENOFIBRIC ACID) 135 MG CPDR, Take 1 capsule  by mouth daily., Disp: 90 capsule, Rfl: 1 .  escitalopram (LEXAPRO) 20 MG tablet, TAKE 1 TABLET(20 MG) BY MOUTH DAILY, Disp: 30 tablet, Rfl: 1 .  finasteride (PROSCAR) 5 MG tablet, TAKE 1 TABLET BY MOUTH ONCE DAILY, Disp: 90 tablet, Rfl: 1 .  fluticasone (FLONASE) 50 MCG/ACT nasal spray, Place 2 sprays into both nostrils daily., Disp: 48 g, Rfl: 1 .  Insulin Detemir (LEVEMIR FLEXTOUCH) 100 UNIT/ML Pen, 56 U SQ once every day, Disp: 15 mL, Rfl: 11 .  Insulin Pen Needle (BD PEN NEEDLE NANO U/F) 32G X 4 MM MISC, USE FOR INSULIN INJECTION FOUR TIMES A DAY, Disp: 100 each, Rfl: 12 .  Liraglutide -Weight Management (SAXENDA) 18 MG/3ML SOPN, Inject 3 mg into the skin daily., Disp: 5 pen, Rfl: 0 .  MELATONIN PO, Take by mouth., Disp: , Rfl:  .  metFORMIN (GLUCOPHAGE) 1000 MG tablet, Take 1 tablet (1,000 mg total) by mouth 2 (two) times daily with a meal., Disp: 180 tablet, Rfl: 1 .  Naproxen Sodium (ALEVE) 220 MG CAPS, Take 2 capsules by mouth at bedtime as needed.  , Disp: , Rfl:  .  Omega-3 Fatty Acids (FISH OIL) 1000 MG CAPS, Take 1 capsule by mouth 2 (two) times daily., Disp: , Rfl:  .  ONE TOUCH ULTRA TEST test strip, TEST THREE TIMES DAILY, Disp: 100 each, Rfl: 11 .  tamsulosin (FLOMAX) 0.4 MG CAPS capsule, Take 2 capsules (0.8 mg total) by mouth at bedtime., Disp: 180 capsule, Rfl: 1 .  vitamin B-12 (CYANOCOBALAMIN) 1000 MCG tablet, Take 1,000 mcg by mouth daily.  , Disp: , Rfl:  .  Vitamin D, Ergocalciferol, (DRISDOL) 1.25 MG (50000 UT) CAPS capsule, Take 1 capsule (50,000 Units total) by mouth every 7 (seven) days., Disp: 4 capsule, Rfl: 0 .  SALINE NASAL SPRAY NA, Place into the nose daily as needed., Disp: , Rfl:   EXAM:  VITALS per patient if applicable: BP (!) 69/67 (BP Location: Left Arm, Patient Position: Sitting, Cuff Size: Large)   Pulse 86   Wt 247 lb 3.2 oz (112.1 kg)   BMI 35.47 kg/m    GENERAL: alert, oriented, appears well and in no acute distress  HEENT: atraumatic,  conjunttiva clear, no obvious abnormalities on inspection of external nose and ears  NECK: normal movements of the head and neck  LUNGS: on inspection no signs of respiratory distress, breathing rate appears normal, no obvious gross SOB, gasping or wheezing  CV: no obvious cyanosis  MS: moves all visible extremities without noticeable abnormality  PSYCH/NEURO: pleasant and cooperative, no  obvious depression or anxiety, speech and thought processing grossly intact  LABS: none today  Lab Results  Component Value Date   HGBA1C 6.1 (H) 02/14/2019     Chemistry      Component Value Date/Time   NA 144 02/14/2019 1019   K 4.5 02/14/2019 1019   CL 102 02/14/2019 1019   CO2 23 02/14/2019 1019   BUN 28 (H) 02/14/2019 1019   CREATININE 0.95 02/14/2019 1019   CREATININE 0.94 01/18/2017 0832      Component Value Date/Time   CALCIUM 9.9 02/14/2019 1019   ALKPHOS 35 (L) 02/14/2019 1019   AST 9 02/14/2019 1019   ALT 17 02/14/2019 1019   BILITOT 0.4 02/14/2019 1019     Lab Results  Component Value Date   CHOL 135 02/14/2019   HDL 45 02/14/2019   LDLCALC 72 02/14/2019   LDLDIRECT 112.0 07/31/2016   TRIG 88 02/14/2019   CHOLHDL 3 08/10/2018   Lab Results  Component Value Date   WBC 7.2 02/14/2018   HGB 13.4 02/14/2018   HCT 39.3 02/14/2018   MCV 85 02/14/2018   PLT 211.0 05/21/2014   Lab Results  Component Value Date   TSH 2.030 02/14/2018   Lab Results  Component Value Date   PSA 0.04 (L) 05/09/2018   PSA 0.03 (L) 07/04/2015   PSA 0.02 (L) 05/21/2014    ASSESSMENT AND PLAN:  Discussed the following assessment and plan:  1) DM 2: doing great. Diab eye exam UTD, will get record. No changes in med regimen today. HbA1c -future.  2) HTN: The current medical regimen is effective;  continue present plan and medications. Hist of this but bp actually low normal consistently OFF med. Last lytes/cr great 3 mo ago.  3) HLD: tolerating statin. Recent lipid panel and  hepatic panel great 3 mo ago.  4) chronic LBP: undergoing eval with Dr. Vertell Limber in neurosurgery. Starting gabapentin any day now. MRI scheduled for next week.  5) Due for prostate ca screening: PSA--future.   Pneumovax 23 needed-will give him this at an upcoming in-office follow up visit.   I discussed the assessment and treatment plan with the patient. The patient was provided an opportunity to ask questions and all were answered. The patient agreed with the plan and demonstrated an understanding of the instructions.   The patient was advised to call back or seek an in-person evaluation if the symptoms worsen or if the condition fails to improve as anticipated.  F/u: 6 mo  Signed:  Crissie Sickles, MD           05/18/2019

## 2019-05-19 ENCOUNTER — Ambulatory Visit (INDEPENDENT_AMBULATORY_CARE_PROVIDER_SITE_OTHER): Payer: Medicare Other | Admitting: Family Medicine

## 2019-05-19 DIAGNOSIS — Z125 Encounter for screening for malignant neoplasm of prostate: Secondary | ICD-10-CM | POA: Diagnosis not present

## 2019-05-19 DIAGNOSIS — E118 Type 2 diabetes mellitus with unspecified complications: Secondary | ICD-10-CM

## 2019-05-19 LAB — PSA, MEDICARE: PSA: 0.04 ng/ml — ABNORMAL LOW (ref 0.10–4.00)

## 2019-05-19 LAB — HEMOGLOBIN A1C: Hgb A1c MFr Bld: 6.4 % (ref 4.6–6.5)

## 2019-05-22 ENCOUNTER — Encounter: Payer: Self-pay | Admitting: Family Medicine

## 2019-05-22 ENCOUNTER — Telehealth: Payer: Self-pay

## 2019-05-22 NOTE — Telephone Encounter (Signed)
My Chart message sent

## 2019-05-24 ENCOUNTER — Ambulatory Visit (HOSPITAL_COMMUNITY)
Admission: RE | Admit: 2019-05-24 | Discharge: 2019-05-24 | Disposition: A | Discharge status: Home / Self Care | Payer: Medicare Other | Source: Ambulatory Visit | Attending: Neurosurgery | Admitting: Neurosurgery

## 2019-05-24 ENCOUNTER — Other Ambulatory Visit: Payer: Self-pay

## 2019-05-24 ENCOUNTER — Encounter: Payer: Self-pay | Admitting: Family Medicine

## 2019-05-24 DIAGNOSIS — M545 Low back pain: Secondary | ICD-10-CM | POA: Diagnosis not present

## 2019-05-24 DIAGNOSIS — M5416 Radiculopathy, lumbar region: Secondary | ICD-10-CM | POA: Diagnosis not present

## 2019-05-24 IMAGING — MR MRI LUMBAR SPINE WITHOUT CONTRAST
4 of 5 series · 15 of 48 positions shown · non-contrast
Comparison: Radiography [DATE]

CLINICAL DATA: Low back pain radiating to both hips, worse on the
right

EXAM:
MRI LUMBAR SPINE WITHOUT CONTRAST
TECHNIQUE: Multiplanar, multisequence MR imaging of the lumbar spine was
performed. No intravenous contrast was administered.

[Series 3: T2 · sagittal · 4.0mm · 0.70mm/px · 6 of 17 slices shown (1 of 2)]
[im 1/17]
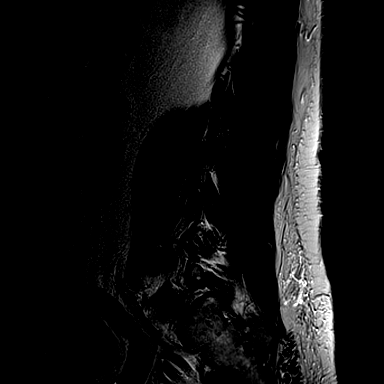
[im 4/17]
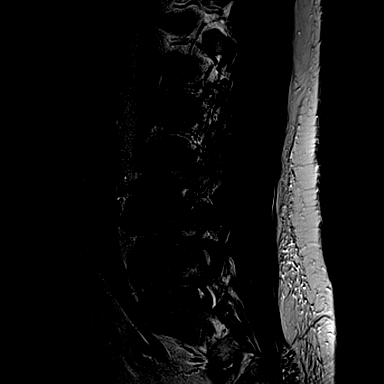
[im 7/17]
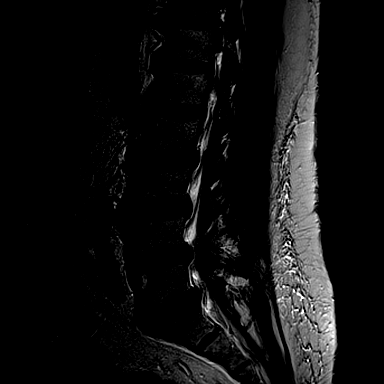
[im 10/17]
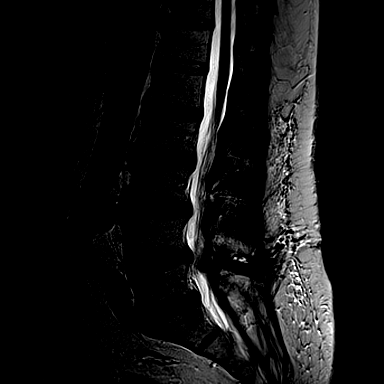
[im 13/17]
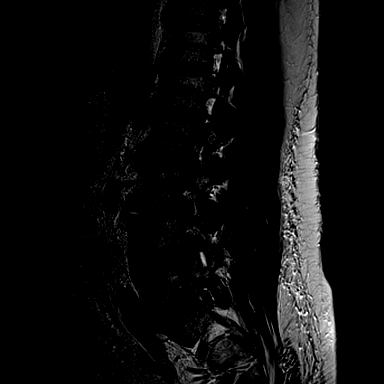
[im 17/17]
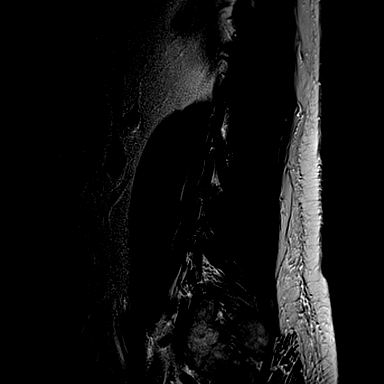

[Series 4: T1 · sagittal · 4.0mm · 0.35mm/px · 3 of 17 slices shown (1 of 2)]
[im 4/17]
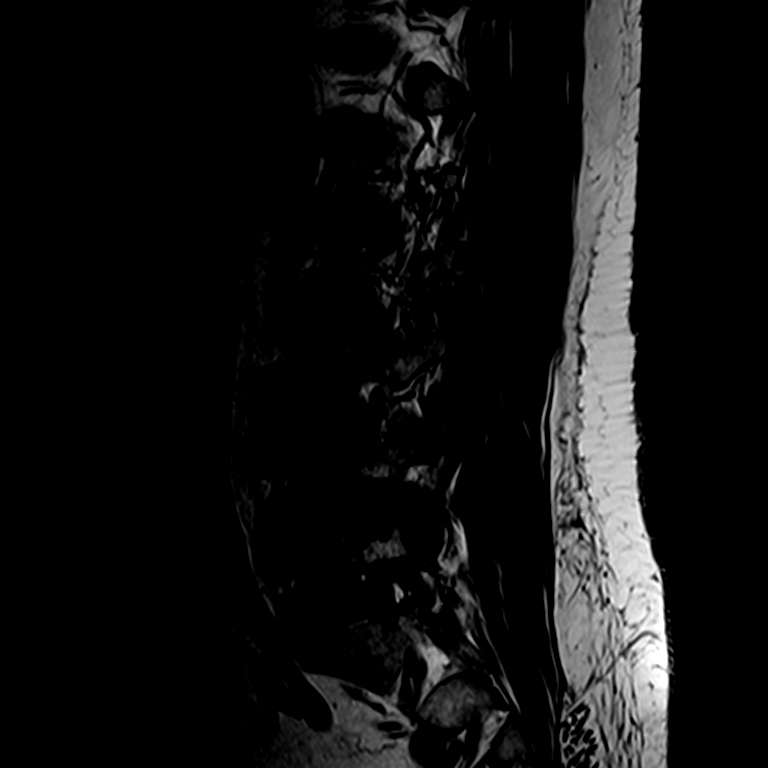
[im 10/17]
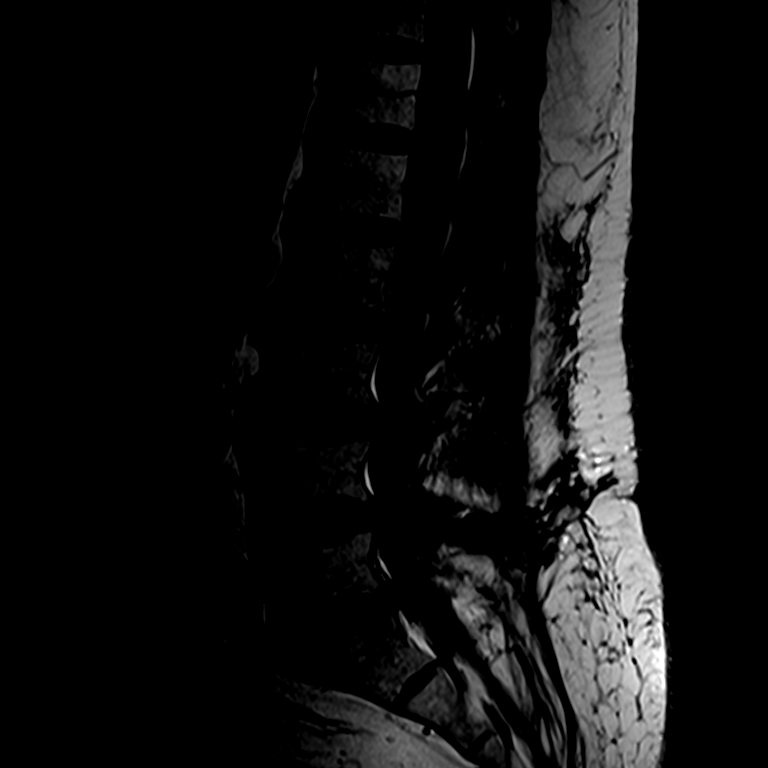
[im 17/17]
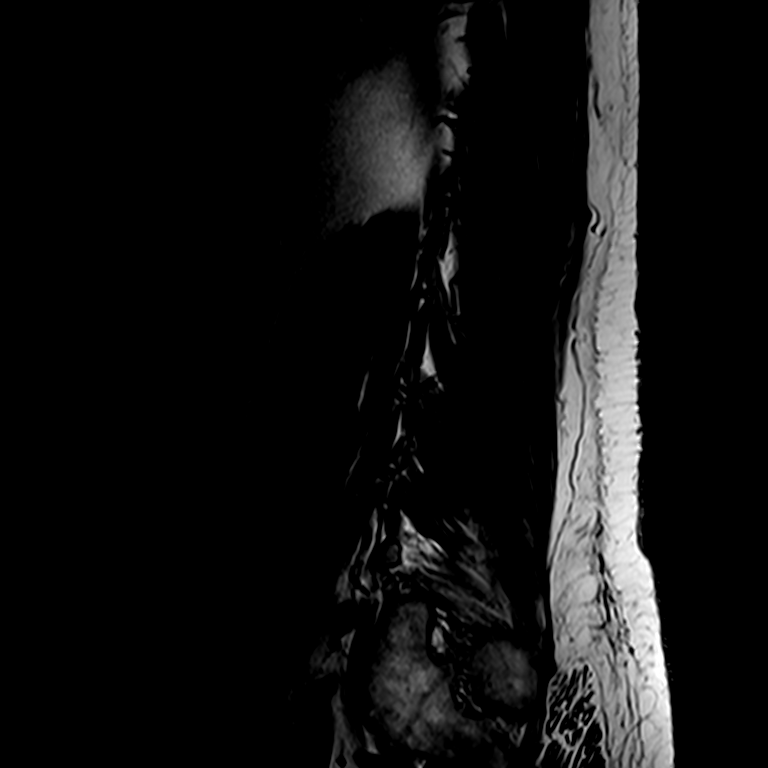

[Series 6: T2 · axial · 4.0mm · 0.26mm/px · z∈[-90,+42]mm · 3 of 43 slices shown (2 of 2)]
[im 7/43]
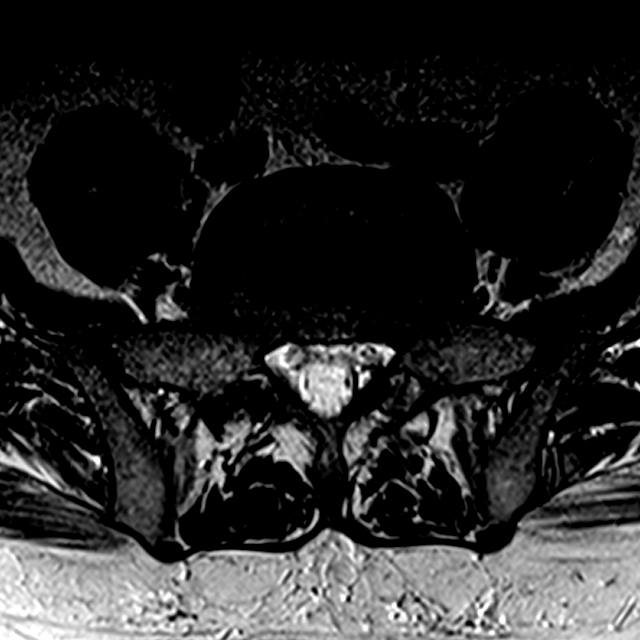
[im 22/43]
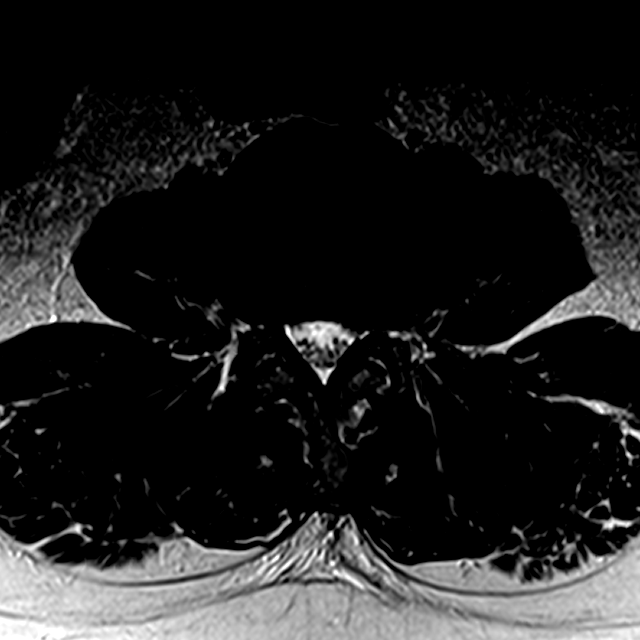
[im 37/43]
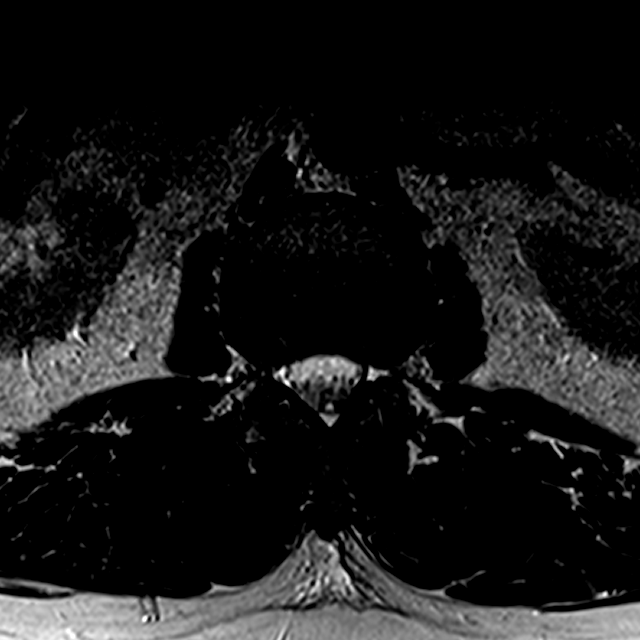

[Series 7: T1 · axial · 4.0mm · 0.26mm/px · z∈[-90,+42]mm · 3 of 43 slices shown (2 of 2)]
[im 7/43]
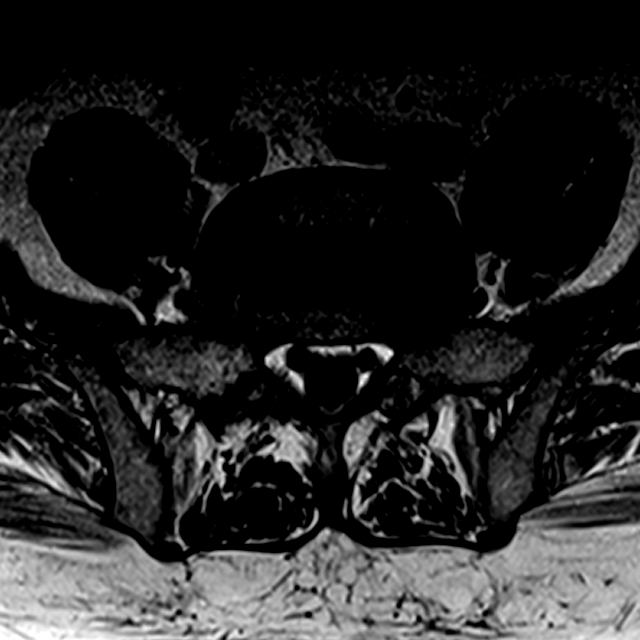
[im 22/43]
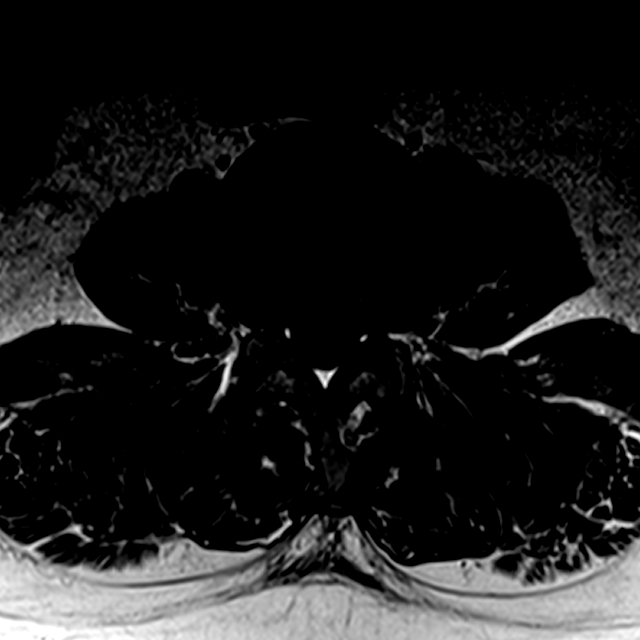
[im 37/43]
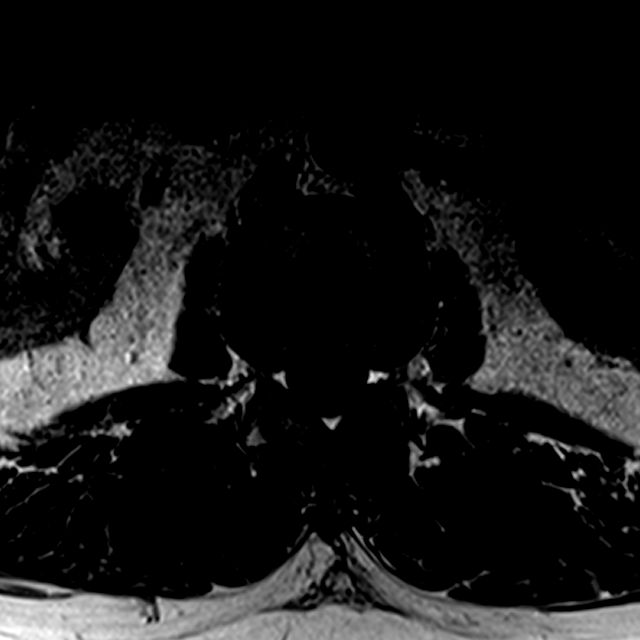

[15 of 48 positions shown; findings below may reference images not displayed]

FINDINGS: Segmentation:  Standard lumbar numbering

Alignment:  Physiologic.

Vertebrae:  No fracture, evidence of discitis, or bone lesion.

Conus medullaris and cauda equina: Conus extends to the L1-2 level.
Conus and cauda equina appear normal.

Paraspinal and other soft tissues: Negative

Disc levels:

T12- L1: Unremarkable.

L1-L2: Mild disc bulging.  No impingement

L2-L3: Mild disc bulging crowding the foramina. No neural
compression

L3-L4: Disc bulging with moderate bilateral foraminal narrowing.
Probable degenerative midline cyst in the ligamentum flavum without
neural compression

L4-L5: Postoperative level based on posterior scarring. There is
facet arthropathy with joint effusions and interspinous bursa
formation. Disc is bulging and spinal stenosis is moderate to
advanced. Biforaminal L4 compression. "Vocal for right para median
degenerative ganglion along the ligamentum flavum measuring 8 mm,
see sagittal T2 weighted imaging

L5-S1:Biforaminal disc bulging and right more than left facet
hypertrophy with foraminal impingement deforming the L5 nerve roots.
Patent spinal canal
IMPRESSION: 1. L4-5 biforaminal compression due to disc bulge and hypertrophic
facets. There is gaping facet joints and interspinous bursa,
question instability at this postoperative level. Spinal stenosis is
moderate to advanced.
2. L5-S1 biforaminal impingement due to disc bulging.
3. L3-4 moderate bilateral foraminal narrowing.

## 2019-05-31 ENCOUNTER — Other Ambulatory Visit: Payer: Self-pay

## 2019-05-31 ENCOUNTER — Ambulatory Visit (INDEPENDENT_AMBULATORY_CARE_PROVIDER_SITE_OTHER): Payer: Medicare Other | Admitting: Physician Assistant

## 2019-05-31 ENCOUNTER — Encounter: Payer: Self-pay | Admitting: Family Medicine

## 2019-05-31 ENCOUNTER — Encounter (INDEPENDENT_AMBULATORY_CARE_PROVIDER_SITE_OTHER): Payer: Self-pay | Admitting: Physician Assistant

## 2019-05-31 VITALS — BP 107/66 | HR 66 | Temp 97.9°F | Ht 70.0 in | Wt 251.0 lb

## 2019-05-31 DIAGNOSIS — F3289 Other specified depressive episodes: Secondary | ICD-10-CM

## 2019-05-31 DIAGNOSIS — Z9189 Other specified personal risk factors, not elsewhere classified: Secondary | ICD-10-CM

## 2019-05-31 DIAGNOSIS — E119 Type 2 diabetes mellitus without complications: Secondary | ICD-10-CM | POA: Diagnosis not present

## 2019-05-31 DIAGNOSIS — Z794 Long term (current) use of insulin: Secondary | ICD-10-CM

## 2019-05-31 DIAGNOSIS — E559 Vitamin D deficiency, unspecified: Secondary | ICD-10-CM | POA: Diagnosis not present

## 2019-05-31 MED ORDER — BUPROPION HCL ER (SR) 150 MG PO TB12: 150.0000 mg | ORAL_TABLET | Freq: Every day | ORAL | 0 refills | Status: DC

## 2019-05-31 MED ORDER — LIRAGLUTIDE 18 MG/3ML ~~LOC~~ SOPN: 1.8000 mg | PEN_INJECTOR | Freq: Every morning | SUBCUTANEOUS | 0 refills | Status: DC

## 2019-05-31 MED ORDER — BD PEN NEEDLE NANO U/F 32G X 4 MM MISC: 12 refills | Status: DC

## 2019-05-31 MED ORDER — GLUCOSE BLOOD VI STRP: ORAL_STRIP | 0 refills | Status: DC

## 2019-05-31 MED ORDER — ESCITALOPRAM OXALATE 20 MG PO TABS: ORAL_TABLET | ORAL | 0 refills | Status: DC

## 2019-05-31 MED ORDER — VITAMIN D (ERGOCALCIFEROL) 1.25 MG (50000 UNIT) PO CAPS: 50000.0000 [IU] | ORAL_CAPSULE | ORAL | 0 refills | Status: DC

## 2019-05-31 NOTE — Progress Notes (Signed)
Office: [phone]  /  Fax: [phone]   HPI:   Chief Complaint: OBESITY [name] [name] is here to discuss his progress with his obesity treatment plan. He is on the Category 4 plan and is following his eating plan approximately 85% of the time. He states he is doing yard work. [name] reports that he is following the plan well but is having terrible cravings in the afternoon and evening causing him to overeat his snack calories. His weight is 251 lb (113.9 kg) today and has had a weight gain of 4 lbs since his last in office visit. He has lost 21 lbs since starting treatment with [name].  Diabetes II [name] [name] has a diagnosis of diabetes type II and is on metformin and Victoza. [name] [name] states fasting blood sugars average 115. Last A1c was 6.4 on 05/19/2019. [name] is supposed to be on Saxenda but the pharmacy has not approved this. He has been working on intensive lifestyle modifications including diet, exercise, and weight loss to help control his blood glucose levels. No nausea, vomiting, or diarrhea but he does report polyphagia.  At risk for cardiovascular disease [name] [name] is at a higher than average risk for cardiovascular disease due to obesity. He currently denies any chest pain.  Depression with emotional eating behaviors [name] [name] is struggling with emotional eating and using food for comfort to the extent that it is negatively impacting his health. He often snacks when he is not hungry. [name] [name] sometimes feels he is out of control and then feels guilty that he made poor food choices. He has been working on behavior modification techniques to help reduce his emotional eating and has been somewhat successful. [name] [name] is on Lexapro. He reports cravings in the afternoon and evening. He shows no sign of suicidal or homicidal ideations.  Depression screen [name] 2/9 08/10/2018 02/14/2018 02/04/2018 11/05/2017 07/08/2017  Decreased Interest 0 2 0 0 0  Down, Depressed, Hopeless 0 1 1 1  0  PHQ - 2 Score 0 3 1 1  0    Altered sleeping 1 1 3 3  -  Tired, decreased energy 2 3 1 2  -  Change in appetite 0 2 0 1 -  Feeling bad or failure about yourself  0 1 0 1 -  Trouble concentrating 1 3 0 1 -  Moving slowly or fidgety/restless 0 1 0 0 -  Suicidal thoughts 0 0 0 0 -  PHQ-9 Score 4 14 5 9  -  Difficult doing work/chores Not difficult at all Not difficult at all Not difficult at all Not difficult at all -   Vitamin D deficiency Camarion has a diagnosis of Vitamin D deficiency. He is currently taking prescription Vit D and denies nausea, vomiting or muscle weakness.  ASSESSMENT AND PLAN:  Type 2 diabetes mellitus without complication, with long-term current use of insulin ([place]) - Plan: Insulin Pen Needle (BD PEN NEEDLE NANO U/F) 32G X 4 MM MISC, glucose blood test strip, liraglutide (VICTOZA) 18 MG/3ML SOPN  Other depression - Plan: buPROPion (WELLBUTRIN SR) 150 MG 12 hr tablet, escitalopram (LEXAPRO) 20 MG tablet  At risk for heart disease  Other depression - with emotional eating - Plan: buPROPion (WELLBUTRIN SR) 150 MG 12 hr tablet, escitalopram (LEXAPRO) 20 MG tablet  Vitamin D deficiency - Plan: Vitamin D, Ergocalciferol, (DRISDOL) 1.25 MG (50000 UT) CAPS capsule  PLAN:  Diabetes II [name] [name] has been given extensive diabetes education by myself today including ideal fasting and post-prandial blood glucose readings, individual ideal Hgb A1c goals  and  hypoglycemia prevention. We discussed the importance of good blood sugar control to decrease the likelihood of diabetic complications such as nephropathy, neuropathy, limb loss, blindness, coronary artery disease, and death. We discussed the importance of intensive lifestyle modification including diet, exercise and weight loss as the first line treatment for diabetes. [name] [name] agrees to continue his diabetes medications and weight loss. He was given a refill on his needles #100 with 0 refills, strips #100 with 0 refills, and Victoza #3 pens with 0 refills. He  agrees to follow-up with our clinic in 2 weeks.  Cardiovascular risk counseling [name] [name] was given extended (15 minutes) coronary artery disease prevention counseling today. He is 68 y.o. male and has risk factors for heart disease including obesity. We discussed intensive lifestyle modifications today with an emphasis on specific weight loss instructions and strategies. Pt was also informed of the importance of increasing exercise and decreasing saturated fats to help prevent heart disease.  Depression with Emotional Eating Behaviors We discussed behavior modification techniques today to help [name] [name] deal with his emotional eating and depression. [name] [name] will start Wellbutrin 150 SR 1 PO QAM #30 with 0 refills. He was also given a refill on his Lexapro #30 with 1 refill and he agrees to follow-up with our clinic in 2 weeks.  Vitamin D Deficiency [name] [name] was informed that low Vitamin D levels contributes to fatigue and are associated with obesity, breast, and colon cancer. He agrees to continue to take prescription Vit D @ 50,000 IU every week #4 with 0 refills and will follow-up for routine testing of Vitamin D, at least 2-3 times per year. He was informed of the risk of over-replacement of Vitamin D and agrees to not increase his dose unless he discusses this with [name] first. [name] [name] agrees to follow-up with our clinic in 2 weeks.  Obesity [name] [name] is currently in the action stage of change. As such, his goal is to continue with weight loss efforts. He has agreed to follow the Category 4 plan. [name] [name] has been instructed to work up to a goal of 150 minutes of combined cardio and strengthening exercise per week for weight loss and overall health benefits. We discussed the following Behavioral Modification Strategies today: work on meal planning, easy cooking plans, and better snacking choices.  [name] [name] has agreed to follow-up with our clinic in 2 weeks. He was informed of the importance of frequent follow-up  visits to maximize his success with intensive lifestyle modifications for his multiple health conditions.  ALLERGIES: Allergies  Allergen Reactions   Simvastatin Other (See Comments)    myalgias   Antihistamines, Diphenhydramine-Type Other (See Comments)    depression   Codeine Nausea Only   Penicillins Hives   Sulfa Antibiotics Hives    MEDICATIONS: Current Outpatient Medications on File Prior to Visit  Medication Sig Dispense Refill   aspirin 325 MG tablet Take 325 mg by mouth daily.       atorvastatin (LIPITOR) 80 MG tablet Take 1 tablet (80 mg total) by mouth daily. 90 tablet 1   Choline Fenofibrate (FENOFIBRIC ACID) 135 MG CPDR Take 1 capsule by mouth daily. 90 capsule 1   finasteride (PROSCAR) 5 MG tablet TAKE 1 TABLET BY MOUTH ONCE DAILY 90 tablet 1   fluticasone (FLONASE) 50 MCG/ACT nasal spray Place 2 sprays into both nostrils daily. 48 g 1   Insulin Detemir (LEVEMIR FLEXTOUCH) 100 UNIT/ML Pen 56 U SQ once every day 15 mL 11   Liraglutide -Weight Management (SAXENDA) 18 MG/3ML SOPN Inject  3 mg into the skin daily. 5 pen 0   MELATONIN PO Take by mouth.     metFORMIN (GLUCOPHAGE) 1000 MG tablet Take 1 tablet (1,000 mg total) by mouth 2 (two) times daily with a meal. 180 tablet 1   Naproxen Sodium (ALEVE) 220 MG CAPS Take 2 capsules by mouth at bedtime as needed.       Omega-3 Fatty Acids (FISH OIL) 1000 MG CAPS Take 1 capsule by mouth 2 (two) times daily.     ONE TOUCH ULTRA TEST test strip TEST THREE TIMES DAILY 100 each 11   SALINE NASAL SPRAY NA Place into the nose daily as needed.     tamsulosin (FLOMAX) 0.4 MG CAPS capsule Take 2 capsules (0.8 mg total) by mouth at bedtime. 180 capsule 1   vitamin B-12 (CYANOCOBALAMIN) 1000 MCG tablet Take 1,000 mcg by mouth daily.       No current facility-administered medications on file prior to visit.     PAST MEDICAL HISTORY: Past Medical History:  Diagnosis Date   Allergic rhinitis, unspecified    Allergy  testing via allergist 04/2017--mostly neg.  Dr. Donneta Romberg recommended referral to ENT so i did this.   Back pain    LBP + bilat hip and thigh pain-->Dr. Vertell Limber to do MRI as of 05/2019   BPH (benign prostatic hypertrophy)    Chronic nasal congestion    DDD (degenerative disc disease), lumbar 2010   laminectomy 01/2009   Depression    Hospitalized for suicidal ideation 1992.  Has been on lexapro since 10/2008.   Diabetes mellitus type II    Dx'd approximately 06/2008.  No retinopathy as of 02/2017 ophth.   Erectile dysfunction    History of colon polyps 01/2016   Recall 3 yrs   History of pneumonia 2008   Hospitalized   History of scarlet fever    age 62   Hyperlipidemia, mixed 1993   myalgias on pravastatin   Hypertension 2009   "marked chronic cardiomegaly" on CXR 01/2009 per old records.   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  02/21/16: tubular adenoma.  Recall 3 yrs (Dr. Ardis Hughs).   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   ELECTROCARDIOGRAM  01/29/2009   NORMAL   LUMBAR LAMINECTOMY  01/2009   TONSILLECTOMY AND ADENOIDECTOMY     age 63   TRANSTHORACIC ECHOCARDIOGRAM  02/22/2018   EF 55-60%, grd I DD   URETHRAL DILATION     2 as a child, one as an adult   VASECTOMY  1994   VITRECTOMY      SOCIAL HISTORY: Social History   Tobacco Use   Smoking status: Former Smoker   Smokeless tobacco: Never Used  Substance Use Topics    Alcohol use: Yes    Comment: social, 3-4  drinks month   Drug use: No    FAMILY HISTORY: Family History  Problem Relation Age of Onset   Heart disease Mother    Hyperlipidemia Mother    Stroke Mother    Diabetes Mother    Depression Mother    Alzheimer's disease Mother    Stroke Father    Hyperlipidemia Father    Heart disease Father    Diabetes Father    Hypertension Father    Obesity Father    Alcohol abuse Sister    Depression Sister    ROS: Review of Systems  Gastrointestinal: Negative for nausea and vomiting.  Musculoskeletal:       Negative for muscle weakness.  Endo/Heme/Allergies:       Positive for polyphagia.  Psychiatric/Behavioral: Positive for depression (emotional eating). Negative for suicidal ideas.       Negative for homicidal ideas.   PHYSICAL EXAM: Blood pressure 107/66, pulse 66, temperature 97.9 F (36.6 C), height 5\' 10"  (1.778 m), weight 251 lb (113.9 kg), SpO2 98 %. Body mass index is 36.01 kg/m. Physical Exam Vitals signs reviewed.  Constitutional:      Appearance: Normal appearance. He is obese.  Cardiovascular:     Rate and Rhythm: Normal rate.     Pulses: Normal pulses.  Pulmonary:     Effort: Pulmonary effort is normal.     Breath sounds: Normal breath sounds.  Musculoskeletal: Normal range of motion.  Skin:    General: Skin is warm and dry.  Neurological:     Mental Status: He is alert and oriented to person, place, and time.  Psychiatric:        Behavior: Behavior normal.   RECENT LABS AND TESTS: BMET    Component Value Date/Time   NA 144 02/14/2019 1019   K 4.5 02/14/2019 1019   CL 102 02/14/2019 1019   CO2 23 02/14/2019 1019   GLUCOSE 109 (H) 02/14/2019 1019   GLUCOSE 113 (H) 08/10/2018 1033   BUN 28 (H) 02/14/2019 1019   CREATININE 0.95 02/14/2019 1019   CREATININE 0.94 01/18/2017 0832   CALCIUM 9.9 02/14/2019 1019   GFRNONAA 83 02/14/2019 1019   GFRAA 96 02/14/2019 1019   Lab Results  Component  Value Date   HGBA1C 6.4 05/19/2019   HGBA1C 6.1 (H) 02/14/2019   HGBA1C 5.8 (A) 11/14/2018   HGBA1C 6.3 08/10/2018   HGBA1C 6.5 05/09/2018   No results found for: INSULIN CBC    Component Value Date/Time   WBC 7.2 02/14/2018 0848   WBC 6.2 05/21/2014 1541   RBC 4.63 02/14/2018 0848   RBC 4.45 05/21/2014 1541   HGB 13.4 02/14/2018 0848   HCT 39.3 02/14/2018 0848   PLT 211.0 05/21/2014 1541   MCV 85 02/14/2018 0848   MCH 28.9 02/14/2018 0848   MCHC 34.1 02/14/2018 0848   MCHC 33.3 05/21/2014 1541   RDW 14.1 02/14/2018 0848   LYMPHSABS 2.5 02/14/2018 0848   MONOABS 0.4 05/21/2014 1541   EOSABS 0.2 02/14/2018 0848   BASOSABS 0.0 02/14/2018 0848   Iron/TIBC/Ferritin/ %Sat No results found for: IRON, TIBC, FERRITIN, IRONPCTSAT Lipid Panel     Component Value Date/Time   CHOL 135 02/14/2019 1019   TRIG 88 02/14/2019 1019   HDL 45 02/14/2019 1019   CHOLHDL 3 08/10/2018 1033   VLDL 18.2 08/10/2018 1033   LDLCALC 72 02/14/2019 1019   LDLDIRECT 112.0 07/31/2016 1116   Hepatic Function Panel     Component Value Date/Time   PROT 6.6  02/14/2019 1019   ALBUMIN 4.5 02/14/2019 1019   AST 9 02/14/2019 1019   ALT 17 02/14/2019 1019   ALKPHOS 35 (L) 02/14/2019 1019   BILITOT 0.4 02/14/2019 1019      Component Value Date/Time   TSH 2.030 02/14/2018 0848   TSH 1.46 05/21/2014 1541   TSH 2.50 05/10/2013 0832   Results for DESHAWN, WITTY "TOM" (MRN [id number]) as of 05/31/2019 15:04  Ref. Range 02/14/2019 10:19  Vitamin D, 25-Hydroxy Latest Ref Range: 30.0 - 100.0 ng/mL 12.0 (L)   OBESITY BEHAVIORAL INTERVENTION VISIT  Today's visit was #23  Starting weight: 272 lbs Starting date: 02/14/2018 Today's weight: 251 lbs  Today's date: 05/31/2019 Total lbs lost to date: 21    05/31/2019  Height 5\' 10"  (1.778 m)  Weight 251 lb (113.9 kg)  BMI (Calculated) 36.01  BLOOD PRESSURE - SYSTOLIC 372  BLOOD PRESSURE - DIASTOLIC 66   Body Fat % 90.2 %  Total Body Water (lbs) 122.61  lbs   ASK: We discussed the diagnosis of obesity with Margarita Rana today and Carlyn agreed to give [name] permission to discuss obesity behavioral modification therapy today.  ASSESS: Jimie has the diagnosis of obesity and his BMI today is 36.1. Moishy is in the action stage of change.   ADVISE: Clifford was educated on the multiple health risks of obesity as well as the benefit of weight loss to improve his health. He was advised of the need for long term treatment and the importance of lifestyle modifications to improve his current health and to decrease his risk of future health problems.  AGREE: Multiple dietary modification options and treatment options were discussed and  Kip agreed to follow the recommendations documented in the above note.  ARRANGE: Kyel was educated on the importance of frequent visits to treat obesity as outlined per CMS and USPSTF guidelines and agreed to schedule his next follow up appointment today.  IMichaelene Song, am acting as Location manager for [employer], PA-C

## 2019-06-01 ENCOUNTER — Other Ambulatory Visit: Payer: Self-pay

## 2019-06-01 NOTE — Telephone Encounter (Signed)
(  Brittanae,pls abstract gabapentin 300mg , 1 tid into pt's chart (note that Dr. Vertell Limber rx's this).  Tom, glad the gabapentin is helping some. Yes, the MRI looks bad. Looks like some injections and/or surgical intervention may be in the future for you (but I don't want to put words into Dr. Melven Sartorius mouth!). Thanks for the update. --PM

## 2019-06-14 ENCOUNTER — Other Ambulatory Visit: Payer: Self-pay

## 2019-06-15 ENCOUNTER — Encounter (INDEPENDENT_AMBULATORY_CARE_PROVIDER_SITE_OTHER): Payer: Self-pay | Admitting: Physician Assistant

## 2019-06-15 ENCOUNTER — Ambulatory Visit (INDEPENDENT_AMBULATORY_CARE_PROVIDER_SITE_OTHER): Payer: Medicare Other | Admitting: Physician Assistant

## 2019-06-15 ENCOUNTER — Other Ambulatory Visit: Payer: Self-pay

## 2019-06-15 VITALS — BP 117/70 | HR 75 | Temp 97.8°F | Ht 70.0 in | Wt 250.0 lb

## 2019-06-15 DIAGNOSIS — Z6835 Body mass index (BMI) 35.0-35.9, adult: Secondary | ICD-10-CM | POA: Diagnosis not present

## 2019-06-15 DIAGNOSIS — F3289 Other specified depressive episodes: Secondary | ICD-10-CM

## 2019-06-15 DIAGNOSIS — E119 Type 2 diabetes mellitus without complications: Secondary | ICD-10-CM

## 2019-06-15 DIAGNOSIS — Z9189 Other specified personal risk factors, not elsewhere classified: Secondary | ICD-10-CM | POA: Diagnosis not present

## 2019-06-15 DIAGNOSIS — Z794 Long term (current) use of insulin: Secondary | ICD-10-CM | POA: Diagnosis not present

## 2019-06-19 ENCOUNTER — Encounter (INDEPENDENT_AMBULATORY_CARE_PROVIDER_SITE_OTHER): Payer: Self-pay | Admitting: Physician Assistant

## 2019-06-19 MED ORDER — BUPROPION HCL ER (SR) 150 MG PO TB12: 150.0000 mg | ORAL_TABLET | Freq: Every day | ORAL | 0 refills | Status: DC

## 2019-06-19 MED ORDER — ESCITALOPRAM OXALATE 20 MG PO TABS: ORAL_TABLET | ORAL | 1 refills | Status: DC

## 2019-06-19 MED ORDER — LIRAGLUTIDE 18 MG/3ML ~~LOC~~ SOPN: 1.8000 mg | PEN_INJECTOR | Freq: Every morning | SUBCUTANEOUS | 0 refills | Status: DC

## 2019-06-19 NOTE — Progress Notes (Signed)
Office: [phone]  /  Fax: [phone]   HPI:   Chief Complaint: OBESITY [name] [name] is here to discuss his progress with his obesity treatment plan. He is on the Category 4 plan and is following his eating plan approximately 90% of the time. He states he is exercising 0 minutes 0 times per week. [name] [name] reports that his cravings have improved since starting Wellbutrin and, therefore, he has been snacking less. His weight is 250 lb (113.4 kg) today and has had a weight loss of 1 pound over a period of 2 weeks since his last visit. He has lost 22 lbs since starting treatment with [name].  Diabetes II [name] [name] has a diagnosis of diabetes type II and is on Victoza and metformin. [name] [name] does not report checking his blood sugars. Last A1c was 6.4 on 05/19/2019. He has been working on intensive lifestyle modifications including diet, exercise, and weight loss to help control his blood glucose levels. No nausea, vomiting, diarrhea, or polyphagia.  At risk for cardiovascular disease [place] is at a higher than average risk for cardiovascular disease due to obesity. He currently denies any chest pain.  Depression with emotional eating behaviors Daaron is struggling with emotional eating and using food for comfort to the extent that it is negatively impacting his health. He often snacks when he is not hungry. [name] sometimes feels he is out of control and then feels guilty that he made poor food choices. He has been working on behavior modification techniques to help reduce his emotional eating and has been somewhat successful. [name] reports his cravings have improved and his blood pressure is normal. He shows no sign of suicidal or homicidal ideations.  Depression screen [name] [name] 2/9 08/10/2018 02/14/2018 02/04/2018 11/05/2017 07/08/2017  Decreased Interest 0 2 0 0 0  Down, Depressed, Hopeless 0 1 1 1  0  PHQ - 2 Score 0 3 1 1  0  Altered sleeping 1 1 3 3  -  Tired, decreased energy 2 3 1 2  -  Change in appetite 0 2 0  1 -  Feeling bad or failure about yourself  0 1 0 1 -  Trouble concentrating 1 3 0 1 -  Moving slowly or fidgety/restless 0 1 0 0 -  Suicidal thoughts 0 0 0 0 -  PHQ-9 Score 4 14 5 9  -  Difficult doing work/chores Not difficult at all Not difficult at all Not difficult at all Not difficult at all -   ASSESSMENT AND PLAN:  Type 2 diabetes mellitus without complication, with long-term current use of insulin (HCC) - Plan: liraglutide (VICTOZA) 18 MG/3ML SOPN  Other depression - Plan: buPROPion (WELLBUTRIN SR) 150 MG 12 hr tablet, escitalopram (LEXAPRO) 20 MG tablet  At risk for heart disease  Class 2 severe obesity with serious comorbidity and body mass index (BMI) of 35.0 to 35.9 in adult, unspecified obesity type (HCC)  PLAN:  Diabetes II [name] [name] has been given extensive diabetes education by myself today including ideal fasting and post-prandial blood glucose readings, individual ideal HgA1c goals  and hypoglycemia prevention. We discussed the importance of good blood sugar control to decrease the likelihood of diabetic complications such as nephropathy, neuropathy, limb loss, blindness, coronary artery disease, and death. We discussed the importance of intensive lifestyle modification including diet, exercise and weight loss as the first line treatment for diabetes. [name] [name] was given a refill on his Victoza #3 with 0 refills and agrees to follow-up with our clinic in 3 weeks.   Cardiovascular risk counseling  [name] [name] was given extended (15 minutes) coronary artery disease prevention counseling today. He is 68 y.o. male and has risk factors for heart disease including obesity. We discussed intensive lifestyle modifications today with an emphasis on specific weight loss instructions and strategies. Pt was also informed of the importance of increasing exercise and decreasing saturated fats to help prevent heart disease.  Depression with Emotional Eating Behaviors We discussed behavior  modification techniques today to help Clearance deal with his emotional eating and depression. [name] [name] was given a refill on his bupropion #30 with 0 refills and a refill on his Lexapro #30 with 1 refill. He agrees to follow-up with our clinic in 3 weeks.  Obesity [name] [name] is currently in the action stage of change. As such, his goal is to continue with weight loss efforts. He has agreed to follow the Category 4 plan. [name] [name] has been instructed to work up to a goal of 150 minutes of combined cardio and strengthening exercise per week for weight loss and overall health benefits. We discussed the following Behavioral Modification Strategies today: work on meal planning and easy cooking plans, and keeping healthy foods in the home.  [name] [name] has agreed to follow-up with our clinic in 3 weeks. He was informed of the importance of frequent follow-up visits to maximize his success with intensive lifestyle modifications for his multiple health conditions.  ALLERGIES: Allergies  Allergen Reactions   Simvastatin Other (See Comments)    myalgias   Antihistamines, Diphenhydramine-Type Other (See Comments)    depression   Codeine Nausea Only   Penicillins Hives   Sulfa Antibiotics Hives    MEDICATIONS: Current Outpatient Medications on File Prior to Visit  Medication Sig Dispense Refill   aspirin 325 MG tablet Take 325 mg by mouth daily.       atorvastatin (LIPITOR) 80 MG tablet Take 1 tablet (80 mg total) by mouth daily. 90 tablet 1   buPROPion (WELLBUTRIN SR) 150 MG 12 hr tablet Take 1 tablet (150 mg total) by mouth daily. 30 tablet 0   Choline Fenofibrate (FENOFIBRIC ACID) 135 MG CPDR Take 1 capsule by mouth daily. 90 capsule 1   escitalopram (LEXAPRO) 20 MG tablet TAKE 1 TABLET(20 MG) BY MOUTH DAILY 30 tablet 0   finasteride (PROSCAR) 5 MG tablet TAKE 1 TABLET BY MOUTH ONCE DAILY 90 tablet 1   fluticasone (FLONASE) 50 MCG/ACT nasal spray Place 2 sprays into both nostrils daily. 48 g 1    gabapentin (NEURONTIN) 300 MG capsule Take 300 mg by mouth 3 (three) times daily.     glucose blood test strip Use as instructed 100 each 0   Insulin Detemir (LEVEMIR FLEXTOUCH) 100 UNIT/ML Pen 56 U SQ once every day 15 mL 11   Insulin Pen Needle (BD PEN NEEDLE NANO U/F) 32G X 4 MM MISC USE FOR INSULIN INJECTION FOUR TIMES A DAY 100 each 12   liraglutide (VICTOZA) 18 MG/3ML SOPN Inject 0.3 mLs (1.8 mg total) into the skin every morning. 3 pen 0   MELATONIN PO Take by mouth.     metFORMIN (GLUCOPHAGE) 1000 MG tablet Take 1 tablet (1,000 mg total) by mouth 2 (two) times daily with a meal. 180 tablet 1   Naproxen Sodium (ALEVE) 220 MG CAPS Take 2 capsules by mouth at bedtime as needed.       Omega-3 Fatty Acids (FISH OIL) 1000 MG CAPS Take 1 capsule by mouth 2 (two) times daily.     ONE TOUCH ULTRA TEST test strip TEST THREE  TIMES DAILY 100 each 11   SALINE NASAL SPRAY NA Place into the nose daily as needed.     tamsulosin (FLOMAX) 0.4 MG CAPS capsule Take 2 capsules (0.8 mg total) by mouth at bedtime. 180 capsule 1   vitamin B-12 (CYANOCOBALAMIN) 1000 MCG tablet Take 1,000 mcg by mouth daily.       Vitamin D, Ergocalciferol, (DRISDOL) 1.25 MG (50000 UT) CAPS capsule Take 1 capsule (50,000 Units total) by mouth every 7 (seven) days. 4 capsule 0   No current facility-administered medications on file prior to visit.     PAST MEDICAL HISTORY: Past Medical History:  Diagnosis Date   Allergic rhinitis, unspecified    Allergy testing via allergist 04/2017--mostly neg.  Dr. Donneta Romberg recommended referral to ENT so i did this.   Back pain    LBP + bilat hip and thigh pain-->Dr. Vertell Limber to do MRI as of 05/2019   BPH (benign prostatic hypertrophy)    Chronic nasal congestion    DDD (degenerative disc disease), lumbar 2010   laminectomy 01/2009   Depression    Hospitalized for suicidal ideation 1992.  Has been on lexapro since 10/2008.   Diabetes mellitus type II    Dx'd approximately  06/2008.  No retinopathy as of 02/2017 ophth.   Erectile dysfunction    History of colon polyps 01/2016   Recall 3 yrs   History of pneumonia 2008   Hospitalized   History of scarlet fever    age 34   Hyperlipidemia, mixed 1993   myalgias on pravastatin   Hypertension 2009   "marked chronic cardiomegaly" on CXR 01/2009 per old records.   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  02/21/16: tubular adenoma.  Recall 3 yrs (Dr. Ardis Hughs).   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   ELECTROCARDIOGRAM  01/29/2009   NORMAL   LUMBAR LAMINECTOMY  01/2009   TONSILLECTOMY AND ADENOIDECTOMY     age 51   TRANSTHORACIC ECHOCARDIOGRAM  02/22/2018   EF 55-60%, grd I DD   URETHRAL DILATION     2 as a child, one as an adult   VASECTOMY  1994   VITRECTOMY      SOCIAL HISTORY: Social History   Tobacco Use   Smoking status: Former Smoker   Smokeless tobacco: Never Used  Substance Use Topics   Alcohol use: Yes    Comment: social, 3-4 drinks month   Drug use: No    FAMILY HISTORY: Family History  Problem Relation Age of Onset   Heart disease Mother    Hyperlipidemia Mother    Stroke Mother    Diabetes Mother    Depression Mother    Alzheimer's disease Mother    Stroke Father    Hyperlipidemia Father    Heart disease Father  Diabetes Father    Hypertension Father    Obesity Father    Alcohol abuse Sister    Depression  Sister    ROS: Review of Systems  Gastrointestinal: Negative for diarrhea, nausea and vomiting.  Endo/Heme/Allergies:       Negative for polyphagia.  Psychiatric/Behavioral: Positive for depression (emotional eating). Negative for suicidal ideas.       Negative for homicidal ideas.   PHYSICAL EXAM: Blood pressure 117/70, pulse 75, temperature 97.8 F (36.6 C), temperature source Oral, height 5\' 10"  (1.778 m), weight 250 lb (113.4 kg), SpO2 97 %. Body mass index is 35.87 kg/m. Physical Exam Vitals signs reviewed.  Constitutional:      Appearance: Normal appearance. He is obese.  Cardiovascular:     Rate and Rhythm: Normal rate.     Pulses: Normal pulses.  Pulmonary:     Effort: Pulmonary effort is normal.     Breath sounds: Normal breath sounds.  Musculoskeletal: Normal range of motion.  Skin:    General: Skin is warm and dry.  Neurological:     Mental Status: He is alert and oriented to person, place, and time.  Psychiatric:        Behavior: Behavior normal.   RECENT LABS AND TESTS: BMET    Component Value Date/Time   NA 144 02/14/2019 1019   K 4.5 02/14/2019 1019   CL 102 02/14/2019 1019   CO2 23 02/14/2019 1019   GLUCOSE 109 (H) 02/14/2019 1019   GLUCOSE 113 (H) 08/10/2018 1033   BUN 28 (H) 02/14/2019 1019   CREATININE 0.95 02/14/2019 1019   CREATININE 0.94 01/18/2017 0832   CALCIUM 9.9 02/14/2019 1019   GFRNONAA 83 02/14/2019 1019   GFRAA 96 02/14/2019 1019   Lab Results  Component Value Date   HGBA1C 6.4 05/19/2019   HGBA1C 6.1 (H) 02/14/2019   HGBA1C 5.8 (A) 11/14/2018   HGBA1C 6.3 08/10/2018   HGBA1C 6.5 05/09/2018   No results found for: INSULIN CBC    Component Value Date/Time   WBC 7.2 02/14/2018 0848   WBC 6.2 05/21/2014 1541   RBC 4.63 02/14/2018 0848   RBC 4.45 05/21/2014 1541   HGB 13.4 02/14/2018 0848   HCT 39.3 02/14/2018 0848   PLT 211.0 05/21/2014 1541   MCV 85 02/14/2018 0848   MCH 28.9 02/14/2018 0848   MCHC 34.1 02/14/2018  0848   MCHC 33.3 05/21/2014 1541   RDW 14.1 02/14/2018 0848   LYMPHSABS 2.5 02/14/2018 0848   MONOABS 0.4 05/21/2014 1541   EOSABS 0.2 02/14/2018 0848   BASOSABS 0.0 02/14/2018 0848   Iron/TIBC/Ferritin/ %Sat No results found for: IRON, TIBC, FERRITIN, IRONPCTSAT Lipid Panel     Component Value Date/Time   CHOL 135 02/14/2019 1019   TRIG 88 02/14/2019 1019   HDL 45 02/14/2019 1019   CHOLHDL 3 08/10/2018 1033   VLDL 18.2 08/10/2018 1033   LDLCALC 72 02/14/2019 1019   LDLDIRECT 112.0 07/31/2016 1116   Hepatic Function Panel     Component Value Date/Time   PROT 6.6 02/14/2019 1019   ALBUMIN 4.5 02/14/2019 1019   AST 9 02/14/2019 1019   ALT 17 02/14/2019 1019   ALKPHOS 35 (L) 02/14/2019 1019   BILITOT 0.4 02/14/2019 1019      Component Value Date/Time   TSH 2.030 02/14/2018 0848   TSH 1.46 05/21/2014 1541   TSH 2.50 05/10/2013 0832   Results for JOVANTE, HAMMITT "TOM" (MRN [id number]) as of 06/19/2019 07:51  Ref. Range 02/14/2019 10:19  Vitamin D, 25-Hydroxy Latest Ref Range: 30.0 - 100.0 ng/mL 12.0 (L)   OBESITY BEHAVIORAL INTERVENTION VISIT  Today's visit was #24  Starting weight: 272 lbs Starting date: 02/14/2018 Today's weight: 250 lbs Today's date: 06/15/2019 Total lbs lost to date: 22    06/15/2019  Height 5\' 10"  (1.778 m)  Weight 250 lb (113.4 kg)  BMI (Calculated) 35.87  BLOOD PRESSURE - SYSTOLIC 409  BLOOD PRESSURE - DIASTOLIC 70   Body Fat % 81.1 %  Total Body Water (lbs) 124.6 lbs   ASK: We discussed the diagnosis of obesity with Margarita Rana today and Djimon agreed to give [name] permission to discuss obesity behavioral modification therapy today.  ASSESS: Jakhai has the diagnosis of obesity and his BMI today is 35.9. Hussein is in the action stage of change.   ADVISE: Leondre was educated on the multiple health risks of obesity as well as the benefit of weight loss to improve his health. He was advised of the need for long term treatment and  the importance of lifestyle modifications to improve his current health and to decrease his risk of future health problems.  AGREE: Multiple dietary modification options and treatment options were discussed and  Birl agreed to follow the recommendations documented in the above note.  ARRANGE: Anjel was educated on the importance of frequent visits to treat obesity as outlined per CMS and USPSTF guidelines and agreed to schedule his next follow up appointment today.  Migdalia Dk, am acting as transcriptionist for Abby Potash, PA-C I, Abby Potash, PA-C have reviewed above note and agree with its content

## 2019-07-02 ENCOUNTER — Encounter (INDEPENDENT_AMBULATORY_CARE_PROVIDER_SITE_OTHER): Payer: Self-pay | Admitting: Physician Assistant

## 2019-07-05 ENCOUNTER — Ambulatory Visit (INDEPENDENT_AMBULATORY_CARE_PROVIDER_SITE_OTHER): Payer: Medicare Other | Admitting: Physician Assistant

## 2019-07-05 DIAGNOSIS — M47816 Spondylosis without myelopathy or radiculopathy, lumbar region: Secondary | ICD-10-CM | POA: Diagnosis not present

## 2019-07-05 DIAGNOSIS — M545 Low back pain: Secondary | ICD-10-CM | POA: Diagnosis not present

## 2019-07-05 DIAGNOSIS — M5416 Radiculopathy, lumbar region: Secondary | ICD-10-CM | POA: Diagnosis not present

## 2019-07-05 DIAGNOSIS — M48061 Spinal stenosis, lumbar region without neurogenic claudication: Secondary | ICD-10-CM | POA: Diagnosis not present

## 2019-07-12 ENCOUNTER — Other Ambulatory Visit: Payer: Self-pay | Admitting: Family Medicine

## 2019-07-13 ENCOUNTER — Ambulatory Visit (HOSPITAL_COMMUNITY): Payer: Medicare Other | Attending: Neurosurgery

## 2019-07-13 ENCOUNTER — Other Ambulatory Visit: Payer: Self-pay

## 2019-07-13 ENCOUNTER — Encounter (HOSPITAL_COMMUNITY): Payer: Self-pay

## 2019-07-13 DIAGNOSIS — M5441 Lumbago with sciatica, right side: Secondary | ICD-10-CM | POA: Insufficient documentation

## 2019-07-13 DIAGNOSIS — M5442 Lumbago with sciatica, left side: Secondary | ICD-10-CM | POA: Insufficient documentation

## 2019-07-13 DIAGNOSIS — R791 Abnormal coagulation profile: Secondary | ICD-10-CM | POA: Diagnosis not present

## 2019-07-13 DIAGNOSIS — G8929 Other chronic pain: Secondary | ICD-10-CM | POA: Diagnosis not present

## 2019-07-13 DIAGNOSIS — M6281 Muscle weakness (generalized): Secondary | ICD-10-CM | POA: Diagnosis not present

## 2019-07-13 NOTE — Therapy (Signed)
[place], [place], [address] Phone: [phone]   Fax:  [phone]  Physical Therapy Evaluation  Patient Details  Name: [name] [name] MRN: [id number] Date of Birth: 12/31/1950 Referring Provider (PT): Erline Levine, MD   Encounter Date: 07/13/2019  PT End of Session - 07/13/19 1228    Visit Number  1    Number of Visits  12    Date for PT Re-Evaluation  08/24/19    Authorization Type  Medicare Part A and B    Authorization Time Period  07/13/19-08/24/19    Authorization - Visit Number  1    Authorization - Number of Visits  10    PT Start Time  1116    PT Stop Time  1207    PT Time Calculation (min)  51 min    Activity Tolerance  Patient tolerated treatment well    Behavior During Therapy  [facility] for tasks assessed/performed       Past Medical History:  Diagnosis Date  . Allergic rhinitis, unspecified    Allergy testing via allergist 04/2017--mostly neg.  Dr. Donneta Romberg recommended referral to ENT so i did this.  . Back pain    LBP + bilat hip and thigh pain-->Dr. Vertell Limber to do MRI as of 05/2019  . BPH (benign prostatic hypertrophy)   . Chronic nasal congestion   . DDD (degenerative disc disease), lumbar 2010   laminectomy 01/2009  . Depression    Hospitalized for suicidal ideation 40.  Has been on lexapro since 10/2008.  . Diabetes mellitus type II    Dx'd approximately 06/2008.  No retinopathy as of 02/2017 ophth.  . Erectile dysfunction   . History of colon polyps 01/2016   Recall 3 yrs  . History of pneumonia 2008   Hospitalized  . History of scarlet fever    age 42  . Hyperlipidemia, mixed 1993   myalgias on pravastatin  . Hypertension 2009   "marked chronic cardiomegaly" on CXR 01/2009 per old records.  . 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 Insomnia   . Keratoconus    dx'd 1973  . Memory changes   . OSA on CPAP 2004; 2019   Back on CPAP 04/2018  . Osteoarthritis of  both knees    Mild plain film changes in medial compartment bilat  . Restless leg   . Rhinitis, chronic   . 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 APPENDECTOMY  1972  . COLONOSCOPY  X 4   Most recent was 2007 [place], [place]), with removal of polyps in the first two.  02/21/16: tubular adenoma.  Recall 3 yrs (Dr. Ardis Hughs).  . 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 ELECTROCARDIOGRAM  01/29/2009   NORMAL  . LUMBAR LAMINECTOMY  01/2009  . TONSILLECTOMY AND ADENOIDECTOMY     age 41  . TRANSTHORACIC ECHOCARDIOGRAM  02/22/2018   EF 55-60%, grd I DD  . URETHRAL DILATION     2 as a child, one as an adult  . VASECTOMY  1994  . VITRECTOMY      There were no vitals filed for this visit.   Subjective Assessment - 07/13/19 1116    Subjective  Patient reports a chronic history of low back pain with gradual worsening over the last 9 months. In 2010  underwent L4-5 laminectomy and reports this helped resolve his pain back then. He reports currently his pain is centralized in his low back and is a deep ache that sometimes spreads across the Rt/Lt lower back and he develops a burning sensation in his lateral thighs. He reports the gabapentin has helped with the burning and now he has more of a muscle ache/soreness. He states he is also concerned about his balance and had a fall on steps ~ 2 weeks ago while vacationing in the [place]. He states this is somewhat attributed to vision problems with his depth perception but he would like to work on his balance as well.    Pertinent History  L4-5 laminectomy    Limitations  Walking;Standing    How long can you sit comfortably?  not limited (likes to have back support)    How long can you stand comfortably?  1 hour    How long can you walk comfortably?  20 minutes    Patient Stated Goals  to be able to walk his dogs again and cook/prepare large meals when  entertaining friends/family    Currently in Pain?  No/denies   last 24 hours worst pain = 4/10, best is 0/10   Pain Score  0-No pain    Pain Location  Back    Pain Orientation  Lower;Right;Left    Pain Descriptors / Indicators  Sharp;Aching   like something twisting , LBP deep   Pain Type  Chronic pain    Pain Radiating Towards  radiates down lateral legs)    Pain Onset  More than a month ago    Pain Frequency  Constant    Aggravating Factors   walking (up hill worse than down), standing for prolonged period more than 1 hour    Pain Relieving Factors  sitting down helps, laying down on back helps bu can't sleep in this position    Effect of Pain on Daily Activities  can't walk dogs       [facility] PT Assessment - 07/13/19 0001      Assessment   Medical Diagnosis  Low Back Pain    Referring Provider (PT)  Erline Levine, MD    Onset Date/Surgical Date  --   worsening over the last 9 months   Prior Therapy  Several years ago for back after surgery in 2010.      Precautions   Precautions  None      Restrictions   Weight Bearing Restrictions  No      Balance Screen   Has the patient fallen in the past 6 months  Yes    How many times?  1    Has the patient had a decrease in activity level because of a fear of falling?   Yes    Is the patient reluctant to leave their home because of a fear of falling?   No      Home Environment   Living Environment  Private residence    Living Arrangements  Spouse/significant other    Available Help at Discharge  Family    Type of [place]      Prior Function   Level of [place]  Pt enjoys doing the cooking at home and walking his dogs. He has been limited by back pain with walking his dogs.       Cognition   Overall Cognitive Status  Within Functional Limits for tasks assessed  Observation/Other Assessments   Focus on Therapeutic Outcomes (FOTO)   57% limited      Functional Tests   Functional tests   Single leg stance      Single Leg Stance   Comments  Rt LE = 3 sec; Lt LE = 3 sec      ROM / Strength   AROM / PROM / Strength  AROM;Strength      AROM   AROM Assessment Site  Lumbar    Lumbar Flexion  60    Lumbar Extension  20    Lumbar - Right Side Bend  20    Lumbar - Left Side Bend  25    Lumbar - Right Rotation  NT    Lumbar - Left Rotation  NT      Strength   Overall Strength Comments  all painful around lower back    Strength Assessment Site  Hip    Right Hip Flexion  5/5    Right Hip Extension  4/5    Right Hip ABduction  4/5    Left Hip Flexion  5/5    Left Hip Extension  4+/5    Left Hip ABduction  4/5      Flexibility   Soft Tissue Assessment /Muscle Length  yes    Hamstrings  Rt LE = 90*/160*;  Lt LE = 90*/160*      Ambulation/Gait   Ambulation/Gait  Yes    Ambulation/Gait Assistance  7: Independent    Ambulation Distance (Feet)  546 Feet   2MWT   Gait velocity  1.38 m/s    Stairs  Yes    Stairs Assistance  7: Independent    Stair Management Technique  Two rails;One rail Left;Forwards;Alternating pattern    Number of Stairs  8    Height of Stairs  6       [facility] Adult PT Treatment/Exercise - 07/13/19 0001      Lumbar Exercises: Stretches   Standing Extension  5 reps;10 seconds    Standing Extension Limitations  educated for HEP       Objective measurements completed on examination: See above findings.     PT Education - 07/13/19 1218    Education Details  Educated on exam findings and appropriate POC for therapy. Educated on initial HEP and purpose of exercises.    Person(s) Educated  Patient    Methods  Explanation;Handout;Demonstration    Comprehension  Verbalized understanding;Returned demonstration       PT Short Term Goals - 07/13/19 1239      PT SHORT TERM GOAL #1   Title  Patient will be independent with HEP, updated PRN, to improve activity tolerance, balance, and strength.    Time  2    Period  Weeks    Status  New    Target  Date  07/27/19      PT SHORT TERM GOAL #2   Title  Patient will improve SLS to 10 seconds or greater for bil LE, to improve stability and safety with gait and stair negotiation.    Time  3    Period  Weeks    Status  New    Target Date  08/03/19      PT SHORT TERM GOAL #3   Title  Patient will improve hip strength bil by 1/2 grade or more to indicate significant improvement in strength for greater ease of gait and stair mobility.    Time  3    Period  Weeks    Status  New        PT Long Term Goals - 07/13/19 1241      PT LONG TERM GOAL #1   Title  Patient will report being able to walk his dogs for 30 minutes or greater with no increase in back pain to improve QOL.    Time  6    Period  Weeks    Status  New    Target Date  08/24/19      PT LONG TERM GOAL #2   Title  Patient will be able to prepare/cook a meal for greater than 1 hour to improve QOL and participation in enjoyable activities.    Time  6    Period  Weeks    Status  New      PT LONG TERM GOAL #3   Title  Patient will improve FOTO score by 10 points or greater to indicate significant reduction in self reported limitations for mobility related to low back pain.    Time  6    Period  Weeks    Status  New           Plan - 07/13/19 1230    Clinical Impression Statement  Mr. Nepomuceno presents to physical therapy for evaluation of chronic low back pain. He has recently become more limited by central low back pain, weakness in hips, and pain along bil lateral thighs while walking. He is now unable to participate in pleasurable recreational activities such as walking his dog and cooking meals. He has reported greater pain while walking uphill versus down and objective testing reveals greater pain with AROM into lumbar flexion versus extension. He was provided lumbar extension stretch and reported some relief with this exercise. Patient is presenting with likely lumbar dysfunction and weakness of bil hips. He will  benefit from skilled PT interventions to address impairments and improve mobility and QOL.    Personal Factors and Comorbidities  Age;Comorbidity 2    Comorbidities  HTN, DM    Examination-Activity Limitations  Stand;Stairs;Locomotion Level    Examination-Participation Restrictions  Meal Prep;Community Activity;Other   walking his dogs   Stability/Clinical Decision Making  Stable/Uncomplicated    Clinical Decision Making  Low    Rehab Potential  Good    PT Frequency  2x / week    PT Duration  6 weeks    PT Treatment/Interventions  ADLs/Self Care Home Management;Aquatic Therapy;Cryotherapy;Electrical Stimulation;Moist Heat;DME Instruction;Traction;Gait training;Stair training;Functional mobility training;Therapeutic activities;Therapeutic exercise;Balance training;Neuromuscular re-education;Patient/family education;Manual techniques;Passive range of motion;Dry needling;Joint Manipulations;Spinal Manipulations    PT Next Visit Plan  Review goals and progress lumbar mobility into extension. Gradually progress to dead lift teaching hip hinge technique. Begin hip ext/abd strengthening    PT Home Exercise Plan  07/13/19 - lumbar extension standing;    Consulted and Agree with Plan of Care  Patient       Patient will benefit from skilled therapeutic intervention in order to improve the following deficits and impairments:  Decreased activity tolerance, Decreased balance, Decreased range of motion, Decreased mobility, Difficulty walking, Decreased strength  Visit Diagnosis: 1. Chronic midline low back pain with bilateral sciatica   2. Muscle weakness (generalized)        Problem List Patient Active Problem List   Diagnosis Date Noted  . Other fatigue 02/14/2018  . Shortness of breath on exertion 02/14/2018  . Type 2 diabetes mellitus with hyperglycemia, with long-term current use of insulin ([place]) 02/14/2018  . Abnormal EKG  02/14/2018  . Maxillary sinusitis, acute 12/17/2015  . Cough  12/17/2015  . Fever 12/17/2015  . Diabetes mellitus without complication ([place]) 24/19/9144  . Tooth pain 06/21/2013  . Orthostatic lightheadedness 05/17/2013  . Health maintenance examination 05/17/2013  . Groin strain 11/10/2012  . Hypogonadism, male 01/11/2012  . Type II or unspecified type diabetes mellitus without mention of complication, not stated as uncontrolled 07/08/2011  . HTN (hypertension), benign 07/08/2011  . Hyperlipidemia 07/08/2011  . BPH (benign prostatic hypertrophy) 07/08/2011  . Hx of adenomatous colonic polyps 07/08/2011    Kipp Brood, PT, DPT, [facility] Physical Therapist with [facility]  07/13/2019 12:46 PM    [place] [address] [place], [place], [address] Phone: [phone]   Fax:  [phone]  Name: Wes Lezotte MRN: [id number] Date of Birth: 14-Jul-1951

## 2019-07-13 NOTE — Patient Instructions (Signed)
Standing Lumbar Extension with Counter reps: 10 sets: 1 hold: 10 seconds daily: 5  weekly: 7      Exercise image step 1   Exercise image step 2 Setup  Begin in a standing upright position in front of a counter, with your hands resting on your hips. Movement  Slowly arch your trunk backwards and hold. Tip  Make sure to use the counter for balance as needed and do not bend your knees during the exercise.

## 2019-07-14 ENCOUNTER — Other Ambulatory Visit: Payer: Self-pay | Admitting: Family Medicine

## 2019-07-18 ENCOUNTER — Other Ambulatory Visit: Payer: Self-pay

## 2019-07-18 ENCOUNTER — Ambulatory Visit (HOSPITAL_COMMUNITY): Payer: Medicare Other | Admitting: Physical Therapy

## 2019-07-18 DIAGNOSIS — G8929 Other chronic pain: Secondary | ICD-10-CM | POA: Diagnosis not present

## 2019-07-18 DIAGNOSIS — M5442 Lumbago with sciatica, left side: Secondary | ICD-10-CM | POA: Diagnosis not present

## 2019-07-18 DIAGNOSIS — M6281 Muscle weakness (generalized): Secondary | ICD-10-CM | POA: Diagnosis not present

## 2019-07-18 DIAGNOSIS — M5441 Lumbago with sciatica, right side: Secondary | ICD-10-CM | POA: Diagnosis not present

## 2019-07-18 DIAGNOSIS — R791 Abnormal coagulation profile: Secondary | ICD-10-CM | POA: Diagnosis not present

## 2019-07-18 NOTE — Therapy (Signed)
[place], [place], [address] Phone: [phone]   Fax:  [phone]  Physical Therapy Treatment  Patient Details  Name: [name] [name] MRN: [id number] Date of Birth: 07/31/1951 Referring Provider (PT): Erline Levine, MD   Encounter Date: 07/18/2019  PT End of Session - 07/18/19 1622    Visit Number  2    Number of Visits  12    Date for PT Re-Evaluation  08/24/19    Authorization Type  Medicare Part A and B    Authorization Time Period  07/13/19-08/24/19    Authorization - Visit Number  2    Authorization - Number of Visits  10    PT Start Time  6195    PT Stop Time  1620    PT Time Calculation (min)  42 min    Activity Tolerance  Patient tolerated treatment well    Behavior During Therapy  [facility] for tasks assessed/performed       Past Medical History:  Diagnosis Date  . Allergic rhinitis, unspecified    Allergy testing via allergist 04/2017--mostly neg.  Dr. Donneta Romberg recommended referral to ENT so i did this.  . Back pain    LBP + bilat hip and thigh pain-->Dr. Vertell Limber to do MRI as of 05/2019  . BPH (benign prostatic hypertrophy)   . Chronic nasal congestion   . DDD (degenerative disc disease), lumbar 2010   laminectomy 01/2009  . Depression    Hospitalized for suicidal ideation 80.  Has been on lexapro since 10/2008.  . Diabetes mellitus type II    Dx'd approximately 06/2008.  No retinopathy as of 02/2017 ophth.  . Erectile dysfunction   . History of colon polyps 01/2016   Recall 3 yrs  . History of pneumonia 2008   Hospitalized  . History of scarlet fever    age 7  . Hyperlipidemia, mixed 1993   myalgias on pravastatin  . Hypertension 2009   "marked chronic cardiomegaly" on CXR 01/2009 per old records.  . 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 Insomnia   . Keratoconus    dx'd 1973  . Memory changes   . OSA on CPAP 2004; 2019   Back on CPAP 04/2018  . Osteoarthritis of  both knees    Mild plain film changes in medial compartment bilat  . Restless leg   . Rhinitis, chronic   . 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 APPENDECTOMY  1972  . COLONOSCOPY  X 4   Most recent was 2007 [place], [place]), with removal of polyps in the first two.  02/21/16: tubular adenoma.  Recall 3 yrs (Dr. Ardis Hughs).  . 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 ELECTROCARDIOGRAM  01/29/2009   NORMAL  . LUMBAR LAMINECTOMY  01/2009  . TONSILLECTOMY AND ADENOIDECTOMY     age 18  . TRANSTHORACIC ECHOCARDIOGRAM  02/22/2018   EF 55-60%, grd I DD  . URETHRAL DILATION     2 as a child, one as an adult  . VASECTOMY  1994  . VITRECTOMY      There were no vitals filed for this visit.  Subjective Assessment - 07/18/19 1627    Subjective  PT states he was really sore after last session and still having increased symptoms into Lt hip and wound to  knee.  States he feels it is mostly soreness.    Currently in Pain?  Yes    Pain Score  6     Pain Location  Hip    Pain Orientation  Left    Pain Descriptors / Indicators  Sore;Aching                       [facility] Adult PT Treatment/Exercise - 07/18/19 0001      Lumbar Exercises: Stretches   Lower Trunk Rotation  5 reps;10 seconds    Lower Trunk Rotation Limitations  both sides    Standing Extension  5 reps;10 seconds    Standing Extension Limitations  educated for HEP    Piriformis Stretch  3 reps;20 seconds    Piriformis Stretch Limitations  both in sitting             PT Education - 07/18/19 1621    Education Details  reveiwed goals, HEP and POC moving forward.    Person(s) Educated  Patient    Methods  Explanation    Comprehension  Verbalized understanding       PT Short Term Goals - 07/18/19 1615      PT SHORT TERM GOAL #1   Title  Patient will be independent with HEP, updated PRN, to improve activity  tolerance, balance, and strength.    Time  2    Period  Weeks    Status  On-going    Target Date  07/27/19      PT SHORT TERM GOAL #2   Title  Patient will improve SLS to 10 seconds or greater for bil LE, to improve stability and safety with gait and stair negotiation.    Time  3    Period  Weeks    Status  On-going    Target Date  08/03/19      PT SHORT TERM GOAL #3   Title  Patient will improve hip strength bil by 1/2 grade or more to indicate significant improvement in strength for greater ease of gait and stair mobility.    Time  3    Period  Weeks    Status  On-going        PT Long Term Goals - 07/18/19 1616      PT LONG TERM GOAL #1   Title  Patient will report being able to walk his dogs for 30 minutes or greater with no increase in back pain to improve QOL.    Time  6    Period  Weeks    Status  On-going      PT LONG TERM GOAL #2   Title  Patient will be able to prepare/cook a meal for greater than 1 hour to improve QOL and participation in enjoyable activities.    Time  6    Period  Weeks    Status  On-going      PT LONG TERM GOAL #3   Title  Patient will improve FOTO score by 10 points or greater to indicate significant reduction in self reported limitations for mobility related to low back pain.    Time  6    Period  Weeks    Status  On-going            Plan - 07/18/19 1622    Clinical Impression Statement  Reveiewed goals, HEP and POC moving foward. Pt with increased soreness today, esp in Lt hip and into lumbar region.  Gentle strengtheniing/stab exercises and stretches added this session only.  Pt requested these stretches to add to HEP as they reduced his symptoms, making him feel better.  Pt given copy for home.  Educated to complete exercises 2X day (he was doing 5X day). Pt also educated to complete exercises to pain tolerance and not push beyond.    Personal Factors and Comorbidities  Age;Comorbidity 2    Comorbidities  HTN, DM     Examination-Activity Limitations  Stand;Stairs;Locomotion Level    Examination-Participation Restrictions  Meal Prep;Community Activity;Other   walking his dogs   Stability/Clinical Decision Making  Stable/Uncomplicated    Rehab Potential  Good    PT Frequency  2x / week    PT Duration  6 weeks    PT Treatment/Interventions  ADLs/Self Care Home Management;Aquatic Therapy;Cryotherapy;Electrical Stimulation;Moist Heat;DME Instruction;Traction;Gait training;Stair training;Functional mobility training;Therapeutic activities;Therapeutic exercise;Balance training;Neuromuscular re-education;Patient/family education;Manual techniques;Passive range of motion;Dry needling;Joint Manipulations;Spinal Manipulations    PT Next Visit Plan  Review goals and progress lumbar mobility into extension. Gradually progress to dead lift teaching hip hinge technique. Begin standing functional exercises and hip ext/abd strengthening when sorenss subsides.    PT Home Exercise Plan  07/13/19 - lumbar extension standing;    Consulted and Agree with Plan of Care  Patient       Patient will benefit from skilled therapeutic intervention in order to improve the following deficits and impairments:  Decreased activity tolerance, Decreased balance, Decreased range of motion, Decreased mobility, Difficulty walking, Decreased strength  Visit Diagnosis: 1. Chronic midline low back pain with bilateral sciatica   2. Muscle weakness (generalized)        Problem List Patient Active Problem List   Diagnosis Date Noted  . Other fatigue 02/14/2018  . Shortness of breath on exertion 02/14/2018  . Type 2 diabetes mellitus with hyperglycemia, with long-term current use of insulin ([place]) 02/14/2018  . Abnormal EKG 02/14/2018  . Maxillary sinusitis, acute 12/17/2015  . Cough 12/17/2015  . Fever 12/17/2015  . Diabetes mellitus without complication ([place]) 96/29/5284  . Tooth pain 06/21/2013  . Orthostatic lightheadedness 05/17/2013  .  Health maintenance examination 05/17/2013  . Groin strain 11/10/2012  . Hypogonadism, male 01/11/2012  . Type II or unspecified type diabetes mellitus without mention of complication, not stated as uncontrolled 07/08/2011  . HTN (hypertension), benign 07/08/2011  . Hyperlipidemia 07/08/2011  . BPH (benign prostatic hypertrophy) 07/08/2011  . Hx of adenomatous colonic polyps 07/08/2011   Teena Irani, PTA/CLT [phone]  Teena Irani 07/18/2019, 4:29 PM  [place] [address] [place], [place], [address] Phone: [phone]   Fax:  [phone]  Name: [name] [name] MRN: [id number] Date of Birth: Nov 25, 1951

## 2019-07-18 NOTE — Patient Instructions (Addendum)
On Elbows (Prone)    Rise up on elbows as high as possible, keeping hips on floor. Hold _60___ seconds.   Piriformis Stretch, Sitting    Sit, one ankle on opposite knee, same-side hand on crossed knee. Push down on knee, keeping spine straight. Lean torso forward, with flat back, until tension is felt in hamstrings and gluteals of crossed-leg side. Hold _20-30__ seconds.  Repeat _3__ times per session. Do _2__ sessions per day.  Abduction / Adduction: Controlled Motion (Supine)    Hold _5__ seconds. Repeat _10__ times. Repeat with other leg. Do _2__ sessions per day. Variation:    [employer] small of back into mat, maintain pelvic tilt, roll up one vertebrae at a time. Focus on engaging posterior hip muscles. Hold for _5___ seconds. Repeat _10___ times.   Supine Hamstring Stretch    Lift one leg, placing hands behind thigh. Gently pull leg toward torso until a stretch is felt. Other leg is bent and back is flat against floor. Repeat with other leg. Repeat _3___ times. Do __5__ sessions per day.

## 2019-07-19 ENCOUNTER — Ambulatory Visit (HOSPITAL_COMMUNITY): Payer: Medicare Other | Admitting: Physical Therapy

## 2019-07-20 ENCOUNTER — Other Ambulatory Visit: Payer: Self-pay

## 2019-07-20 ENCOUNTER — Encounter (INDEPENDENT_AMBULATORY_CARE_PROVIDER_SITE_OTHER): Payer: Self-pay | Admitting: Physician Assistant

## 2019-07-20 ENCOUNTER — Ambulatory Visit (INDEPENDENT_AMBULATORY_CARE_PROVIDER_SITE_OTHER): Payer: Medicare Other | Admitting: Physician Assistant

## 2019-07-20 VITALS — BP 113/66 | HR 71 | Temp 97.7°F | Ht 70.0 in | Wt 246.0 lb

## 2019-07-20 DIAGNOSIS — F3289 Other specified depressive episodes: Secondary | ICD-10-CM

## 2019-07-20 DIAGNOSIS — E559 Vitamin D deficiency, unspecified: Secondary | ICD-10-CM

## 2019-07-20 DIAGNOSIS — Z6835 Body mass index (BMI) 35.0-35.9, adult: Secondary | ICD-10-CM

## 2019-07-20 DIAGNOSIS — E66812 Obesity, class 2: Secondary | ICD-10-CM

## 2019-07-20 DIAGNOSIS — E119 Type 2 diabetes mellitus without complications: Secondary | ICD-10-CM

## 2019-07-20 DIAGNOSIS — Z794 Long term (current) use of insulin: Secondary | ICD-10-CM | POA: Diagnosis not present

## 2019-07-20 MED ORDER — VITAMIN D (ERGOCALCIFEROL) 1.25 MG (50000 UNIT) PO CAPS: 50000.0000 [IU] | ORAL_CAPSULE | ORAL | 0 refills | Status: DC

## 2019-07-20 MED ORDER — BUPROPION HCL ER (SR) 150 MG PO TB12: 150.0000 mg | ORAL_TABLET | Freq: Every day | ORAL | 0 refills | Status: DC

## 2019-07-20 MED ORDER — LIRAGLUTIDE 18 MG/3ML ~~LOC~~ SOPN: 1.8000 mg | PEN_INJECTOR | Freq: Every morning | SUBCUTANEOUS | 0 refills | Status: DC

## 2019-07-20 NOTE — Progress Notes (Signed)
Office: [phone]  /  Fax: [phone]   HPI:   Chief Complaint: OBESITY [name] [name] is here to discuss his progress with his obesity treatment plan. He is on the  follow the Category 4 plan and is following his eating plan approximately 55 % of the time. He states he is exercising by doing PT exercises 10-15 minutes 2 times per week. [name] [name] reports that he has had a great deal of back pain with sciatica and has been overeating his calories.  His weight is 246 lb (111.6 kg) today and has had a weight loss of 4 pounds over a period of 5 weeks since his last visit. He has lost 26 lbs since starting treatment with [name].  Diabetes II [name] [name] has a diagnosis of diabetes type II. [name] [name] denies any hypoglycemic episodes, nausea, vomiting, or diarrhea. Last A1c was 6.1.  He has been working on intensive lifestyle modifications including diet, exercise, and weight loss to help control his blood glucose levels. She is on Metformin and Victoza.   Vitamin D deficiency [name] [name] has a diagnosis of vitamin D deficiency. He is currently taking vit D and denies nausea, vomiting or muscle weakness.  Depression with emotional eating behaviors [name] [name] is struggling with emotional eating and using food for comfort to the extent that it is negatively impacting his health. He often snacks when he is not hungry. [name] [name] sometimes feels he is out of control and then feels guilty that he made poor food choices. He has been working on behavior modification techniques to help reduce his emotional eating and has been somewhat successful. He shows no sign of suicidal or homicidal ideations. He is on Wellbutrin and blood pressure is normal.   Depression screen [name] [name] 2/9 08/10/2018 02/14/2018 02/04/2018 11/05/2017 07/08/2017  Decreased Interest 0 2 0 0 0  Down, Depressed, Hopeless 0 1 1 1  0  PHQ - 2 Score 0 3 1 1  0  Altered sleeping 1 1 3 3  -  Tired, decreased energy 2 3 1 2  -  Change in appetite 0 2 0 1 -  Feeling bad or failure  about yourself  0 1 0 1 -  Trouble concentrating 1 3 0 1 -  Moving slowly or fidgety/restless 0 1 0 0 -  Suicidal thoughts 0 0 0 0 -  PHQ-9 Score 4 14 5 9  -  Difficult doing work/chores Not difficult at all Not difficult at all Not difficult at all Not difficult at all -      ASSESSMENT AND PLAN:  Type 2 diabetes mellitus without complication, with long-term current use of insulin (HCC) - Plan: liraglutide (VICTOZA) 18 MG/3ML SOPN  Vitamin D deficiency - Plan: Vitamin D, Ergocalciferol, (DRISDOL) 1.25 MG (50000 UT) CAPS capsule  Other depression - Plan: buPROPion (WELLBUTRIN SR) 150 MG 12 hr tablet  Class 2 severe obesity with serious comorbidity and body mass index (BMI) of 35.0 to 35.9 in adult, unspecified obesity type (HCC)  PLAN: Diabetes II [name] [name] has been given extensive diabetes education by myself today including ideal fasting and post-prandial blood glucose readings, individual ideal HgA1c goals  and hypoglycemia prevention. We discussed the importance of good blood sugar control to decrease the likelihood of diabetic complications such as nephropathy, neuropathy, limb loss, blindness, coronary artery disease, and death. We discussed the importance of intensive lifestyle modification including diet, exercise and weight loss as the first line treatment for diabetes. [name] [name] agrees to continue his diabetes medications Victoza 1.8 mg daily #3 pens with no refills  sent today and will follow up at the agreed upon time.  Vitamin D Deficiency [name] [name] was informed that low vitamin D levels contributes to fatigue and are associated with obesity, breast, and colon cancer. He agrees to continue to take prescription Vit D @50 ,000 IU every week #4 with no refills and will follow up for routine testing of vitamin D, at least 2-3 times per year. He was informed of the risk of over-replacement of vitamin D and agrees to not increase his dose unless he discusses this with [name] first. Agrees to follow  up with our clinic as directed.   Depression with Emotional Eating Behaviors We discussed behavior modification techniques today to help [name] [name] deal with his emotional eating and depression. He has agreed to take Wellbutrin SR 150 mg qd #30 with no refills sent today and agreed to follow up as directed.  Obesity [name] [name] is currently in the action stage of change. As such, his goal is to continue with weight loss efforts He has agreed to follow the Category 4 plan [name] [name] has been instructed to work up to a goal of 150 minutes of combined cardio and strengthening exercise per week for weight loss and overall health benefits. We discussed the following Behavioral Modification Strategies today: work on meal planning and easy cooking plans and keeping healthy foods in the home.    [name] [name] has agreed to follow up with our clinic in 5 weeks. He was informed of the importance of frequent follow up visits to maximize his success with intensive lifestyle modifications for his multiple health conditions.  ALLERGIES: Allergies  Allergen Reactions  . Simvastatin Other (See Comments)    myalgias  . Antihistamines, Diphenhydramine-Type Other (See Comments)    depression  . Codeine Nausea Only  . Penicillins Hives  . Sulfa Antibiotics Hives    MEDICATIONS: Current Outpatient Medications on File Prior to Visit  Medication Sig Dispense Refill  . aspirin 325 MG tablet Take 325 mg by mouth daily.      [name] atorvastatin (LIPITOR) 80 MG tablet Take 1 tablet (80 mg total) by mouth daily. 90 tablet 1  . Choline Fenofibrate (FENOFIBRIC ACID) 135 MG CPDR Take 1 capsule by mouth daily. 90 capsule 1  . escitalopram (LEXAPRO) 20 MG tablet TAKE 1 TABLET(20 MG) BY MOUTH DAILY 30 tablet 1  . finasteride (PROSCAR) 5 MG tablet TAKE 1 TABLET BY MOUTH EVERY DAY 90 tablet 1  . fluticasone (FLONASE) 50 MCG/ACT nasal spray Place 2 sprays into both nostrils daily. 48 g 1  . gabapentin (NEURONTIN) 300 MG capsule Take 300 mg  by mouth 3 (three) times daily.    [name] glucose blood test strip Use as instructed 100 each 0  . Insulin Detemir (LEVEMIR FLEXTOUCH) 100 UNIT/ML Pen 56 U SQ once every day 15 mL 11  . Insulin Pen Needle (BD PEN NEEDLE NANO U/F) 32G X 4 MM MISC USE FOR INSULIN INJECTION FOUR TIMES A DAY 100 each 12  . MELATONIN PO Take by mouth.    . metFORMIN (GLUCOPHAGE) 1000 MG tablet TAKE 1 TABLET BY MOUTH TWICE DAILY WITH A MEAL 180 tablet 1  . Naproxen Sodium (ALEVE) 220 MG CAPS Take 2 capsules by mouth at bedtime as needed.      . Omega-3 Fatty Acids (FISH OIL) 1000 MG CAPS Take 1 capsule by mouth 2 (two) times daily.    . ONE TOUCH ULTRA TEST test strip TEST THREE TIMES DAILY 100 each 11  . SALINE NASAL SPRAY NA  Place into the nose daily as needed.    . tamsulosin (FLOMAX) 0.4 MG CAPS capsule Take 2 capsules (0.8 mg total) by mouth at bedtime. 180 capsule 1  . vitamin B-12 (CYANOCOBALAMIN) 1000 MCG tablet Take 1,000 mcg by mouth daily.       No current facility-administered medications on file prior to visit.     PAST MEDICAL HISTORY: Past Medical History:  Diagnosis Date  . Allergic rhinitis, unspecified    Allergy testing via allergist 04/2017--mostly neg.  Dr. Donneta Romberg recommended referral to ENT so i did this.  . Back pain    LBP + bilat hip and thigh pain-->Dr. Vertell Limber to do MRI as of 05/2019  . BPH (benign prostatic hypertrophy)   . Chronic nasal congestion   . DDD (degenerative disc disease), lumbar 2010   laminectomy 01/2009  . Depression    Hospitalized for suicidal ideation 71.  Has been on lexapro since 10/2008.  . Diabetes mellitus type II    Dx'd approximately 06/2008.  No retinopathy as of 02/2017 ophth.  . Erectile dysfunction   . History of colon polyps 01/2016   Recall 3 yrs  . History of pneumonia 2008   Hospitalized  . History of scarlet fever    age 69  . Hyperlipidemia, mixed 1993   myalgias on pravastatin  . Hypertension 2009   "marked chronic cardiomegaly" on CXR 01/2009 per  old records.  . 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 Insomnia   . Keratoconus    dx'd 1973  . Memory changes   . OSA on CPAP 2004; 2019   Back on CPAP 04/2018  . Osteoarthritis of both knees    Mild plain film changes in medial compartment bilat  . Restless leg   . Rhinitis, chronic   . 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 APPENDECTOMY  1972  . COLONOSCOPY  X 4   Most recent was 2007 [place], [place]), with removal of polyps in the first two.  02/21/16: tubular adenoma.  Recall 3 yrs (Dr. Ardis Hughs).  . 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 ELECTROCARDIOGRAM  01/29/2009   NORMAL  . LUMBAR LAMINECTOMY  01/2009  . TONSILLECTOMY AND ADENOIDECTOMY     age 51  . TRANSTHORACIC ECHOCARDIOGRAM  02/22/2018   EF 55-60%, grd I DD  . URETHRAL DILATION     2 as a child, one as an adult  . VASECTOMY  1994  . VITRECTOMY      SOCIAL HISTORY: Social History   Tobacco Use  . Smoking status: Former Research scientist (life sciences)  . Smokeless tobacco: Never Used  Substance Use Topics  . Alcohol use: Yes    Comment: social, 3-4 drinks month  . Drug use: No    FAMILY HISTORY: Family History  Problem Relation Age of Onset  . Heart disease Mother   . Hyperlipidemia Mother   . Stroke Mother   . Diabetes Mother   . Depression Mother   . Alzheimer's disease Mother   . Stroke Father   . Hyperlipidemia Father   . Heart disease Father   . Diabetes Father   . Hypertension Father   . Obesity Father   . Alcohol abuse Sister   . Depression Sister     ROS: Review of Systems  Constitutional: Positive for weight loss.  Gastrointestinal: Negative for diarrhea, nausea and vomiting.  Musculoskeletal:       Negative for muscle weakness  Endo/Heme/Allergies:       Negative for hypoglycemia   Psychiatric/Behavioral: Positive for depression. Negative for suicidal ideas.     PHYSICAL EXAM: Blood pressure 113/66, pulse 71, temperature 97.7 F (36.5 C), temperature source Oral, height 5\' 10"  (1.778 m), weight 246 lb (111.6 kg), SpO2 97 %. Body mass index is 35.3 kg/m. Physical Exam Vitals signs reviewed.  Constitutional:      Appearance: Normal appearance. He is obese.  HENT:     Head: Normocephalic.     Nose: Nose normal.  Neck:     Musculoskeletal: Normal range of motion.  Cardiovascular:     Rate and Rhythm: Normal rate.  Pulmonary:     Effort: Pulmonary effort is normal.  Musculoskeletal: Normal range of motion.  Skin:    General: Skin is warm and dry.  Neurological:     Mental Status: He is alert and oriented to person, place, and time.  Psychiatric:        Mood and Affect: Mood normal.        Behavior: Behavior normal.     RECENT LABS AND TESTS: BMET    Component Value Date/Time   NA 144 02/14/2019 1019   K 4.5 02/14/2019 1019   CL 102 02/14/2019 1019   CO2 23 02/14/2019 1019   GLUCOSE 109 (H) 02/14/2019 1019   GLUCOSE 113 (H) 08/10/2018 1033   BUN 28 (H) 02/14/2019 1019   CREATININE 0.95 02/14/2019 1019   CREATININE 0.94 01/18/2017 0832   CALCIUM 9.9 02/14/2019 1019   GFRNONAA 83 02/14/2019 1019   GFRAA 96 02/14/2019 1019   Lab Results  Component Value Date   HGBA1C 6.4 05/19/2019   HGBA1C 6.1 (H) 02/14/2019   HGBA1C 5.8 (A) 11/14/2018   HGBA1C 6.3 08/10/2018   HGBA1C 6.5 05/09/2018   No results found for: INSULIN CBC    Component Value Date/Time   WBC 7.2 02/14/2018 0848   WBC 6.2 05/21/2014 1541   RBC 4.63 02/14/2018 0848   RBC 4.45 05/21/2014 1541   HGB 13.4 02/14/2018 0848   HCT 39.3 02/14/2018 0848   PLT 211.0 05/21/2014 1541   MCV 85 02/14/2018 0848   MCH 28.9 02/14/2018 0848   MCHC 34.1 02/14/2018 0848   MCHC 33.3 05/21/2014 1541   RDW 14.1 02/14/2018 0848   LYMPHSABS 2.5 02/14/2018 0848   MONOABS 0.4 05/21/2014 1541   EOSABS 0.2 02/14/2018 0848   BASOSABS 0.0 02/14/2018 0848    Iron/TIBC/Ferritin/ %Sat No results found for: IRON, TIBC, FERRITIN, IRONPCTSAT Lipid Panel     Component Value Date/Time   CHOL 135 02/14/2019 1019   TRIG 88 02/14/2019 1019   HDL 45 02/14/2019 1019   CHOLHDL 3 08/10/2018 1033   VLDL 18.2 08/10/2018 1033   LDLCALC 72 02/14/2019 1019   LDLDIRECT 112.0 07/31/2016 1116   Hepatic Function Panel     Component Value Date/Time   PROT 6.6 02/14/2019 1019   ALBUMIN 4.5 02/14/2019 1019   AST 9 02/14/2019 1019   ALT 17 02/14/2019 1019   ALKPHOS 35 (L) 02/14/2019 1019   BILITOT 0.4 02/14/2019 1019      Component Value Date/Time   TSH 2.030 02/14/2018 0848   TSH 1.46 05/21/2014 1541   TSH 2.50 05/10/2013 0832     Ref. Range 02/14/2019 10:19  Vitamin D, 25-Hydroxy Latest Ref Range: 30.0 - 100.0 ng/mL 12.0 (L)  OBESITY BEHAVIORAL INTERVENTION VISIT  Today's visit was # 25  Starting weight: 272 lbs Starting date: 02/14/18 Today's weight : Weight: 246 lb (111.6 kg)  Today's date: 07/20/2019 Total lbs lost to date: 26 lbs At least 15 minutes were spent on discussing the following behavioral intervention visit.   ASK: We discussed the diagnosis of obesity with [name] [name] today and [name] [name] agreed to give [name] permission to discuss obesity behavioral modification therapy today.  ASSESS: [name] [name] has the diagnosis of obesity and his BMI today is 35.3 [name] [name] is in the action stage of change   ADVISE: [name] [name] was educated on the multiple health risks of obesity as well as the benefit of weight loss to improve his health. He was advised of the need for long term treatment and the importance of lifestyle modifications to improve his current health and to decrease his risk of future health problems.  AGREE: Multiple dietary modification options and treatment options were discussed and  [name] [name] agreed to follow the recommendations documented in the above note.  ARRANGE: [name] [name] was educated on the importance of frequent visits to  treat obesity as outlined per CMS and USPSTF guidelines and agreed to schedule his next follow up appointment today.  [name] [name], am acting as transcriptionist for Abby Potash, PA-C  I, Abby Potash, PA-C have reviewed above note and agree with its content

## 2019-07-21 ENCOUNTER — Ambulatory Visit (HOSPITAL_COMMUNITY): Payer: Medicare Other

## 2019-07-21 ENCOUNTER — Encounter (HOSPITAL_COMMUNITY): Payer: Self-pay

## 2019-07-21 DIAGNOSIS — M6281 Muscle weakness (generalized): Secondary | ICD-10-CM | POA: Diagnosis not present

## 2019-07-21 DIAGNOSIS — G8929 Other chronic pain: Secondary | ICD-10-CM | POA: Diagnosis not present

## 2019-07-21 DIAGNOSIS — M5441 Lumbago with sciatica, right side: Secondary | ICD-10-CM | POA: Diagnosis not present

## 2019-07-21 DIAGNOSIS — R791 Abnormal coagulation profile: Secondary | ICD-10-CM | POA: Diagnosis not present

## 2019-07-21 DIAGNOSIS — M5442 Lumbago with sciatica, left side: Secondary | ICD-10-CM | POA: Diagnosis not present

## 2019-07-21 NOTE — Patient Instructions (Signed)
Supine Bridge with Mini Swiss Ball Between Knees reps: 10 sets: 3 hold: 5 seconds daily: 1  weekly: 7      Exercise image step 1   Exercise image step 2   Exercise image step 3 Setup  Begin lying on your back with your legs bent, feet resting on the floor, and a ball between your knees. Movement  Engage your abdominals as you gently squeeze the ball between your knees and lift your hips off the ground into a bridge position. Hold briefly, then lower back down to the ground and repeat. Tip  Make sure to keep your core engaged and your movements slow and controlled. Do not let your hips rotate to either side during the exercise.

## 2019-07-21 NOTE — Therapy (Signed)
[place], [place], [address] Phone: [phone]   Fax:  [phone]  Physical Therapy Treatment  Patient Details  Name: [name] [name] MRN: [id number] Date of Birth: Apr 01, 1951 Referring Provider (PT): Erline Levine, MD   Encounter Date: 07/21/2019  PT End of Session - 07/21/19 1717    Visit Number  3    Number of Visits  12    Date for PT Re-Evaluation  08/24/19    Authorization Type  Medicare Part A and B    Authorization Time Period  07/13/19-08/24/19    Authorization - Visit Number  3    Authorization - Number of Visits  10    PT Start Time  [id number]    PT Stop Time  1401    PT Time Calculation (min)  44 min    Activity Tolerance  Patient tolerated treatment well    Behavior During Therapy  [facility] for tasks assessed/performed       Past Medical History:  Diagnosis Date  . Allergic rhinitis, unspecified    Allergy testing via allergist 04/2017--mostly neg.  Dr. Donneta Romberg recommended referral to ENT so i did this.  . Back pain    LBP + bilat hip and thigh pain-->Dr. Vertell Limber to do MRI as of 05/2019  . BPH (benign prostatic hypertrophy)   . Chronic nasal congestion   . DDD (degenerative disc disease), lumbar 2010   laminectomy 01/2009  . Depression    Hospitalized for suicidal ideation 30.  Has been on lexapro since 10/2008.  . Diabetes mellitus type II    Dx'd approximately 06/2008.  No retinopathy as of 02/2017 ophth.  . Erectile dysfunction   . History of colon polyps 01/2016   Recall 3 yrs  . History of pneumonia 2008   Hospitalized  . History of scarlet fever    age 63  . Hyperlipidemia, mixed 1993   myalgias on pravastatin  . Hypertension 2009   "marked chronic cardiomegaly" on CXR 01/2009 per old records.  . 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 Insomnia   . Keratoconus    dx'd 1973  . Memory changes   . OSA on CPAP 2004; 2019   Back on CPAP 04/2018  . Osteoarthritis of  both knees    Mild plain film changes in medial compartment bilat  . Restless leg   . Rhinitis, chronic   . 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 APPENDECTOMY  1972  . COLONOSCOPY  X 4   Most recent was 2007 [place], [place]), with removal of polyps in the first two.  02/21/16: tubular adenoma.  Recall 3 yrs (Dr. Ardis Hughs).  . 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 ELECTROCARDIOGRAM  01/29/2009   NORMAL  . LUMBAR LAMINECTOMY  01/2009  . TONSILLECTOMY AND ADENOIDECTOMY     age 55  . TRANSTHORACIC ECHOCARDIOGRAM  02/22/2018   EF 55-60%, grd I DD  . URETHRAL DILATION     2 as a child, one as an adult  . VASECTOMY  1994  . VITRECTOMY      There were no vitals filed for this visit.  Subjective Assessment - 07/21/19 1321    Subjective  Pt reports continued pain in Lt hip/leg. He reports it is painful along his Lt groin and along the outside  of the Lt hip also. He states it gets worse with transitional movement and when initially sitting down it bothers him.    Pertinent History  L4-5 laminectomy    Limitations  Walking;Standing    Patient Stated Goals  to be able to walk his dogs again and cook/prepare large meals when entertaining friends/family    Currently in Pain?  Yes    Pain Score  7     Pain Location  Hip    Pain Orientation  Left;Medial;Lateral;Proximal    Pain Descriptors / Indicators  Aching    Pain Type  Chronic pain    Pain Onset  More than a month ago    Pain Frequency  Constant         [facility] PT Assessment - 07/21/19 0001      Palpation   Palpation comment  pt with tenderness to palpation at Lt greater trochanter and ischial tuberosity.       Special Tests    Special Tests  Hip Special Tests    Hip Special Tests   Saralyn Pilar (FABER) Test;Hip Scouring;Other      Saralyn Pilar [facility]) Test   Findings  Positive    Side  Left    Comments  slight positive, however pt resisting  some and unclear if it is       Hip Scouring   Findings  Negative    Side  Left      other   Findings  Positive    Side  Left    Comments  FADDIR         [facility] Adult PT Treatment/Exercise - 07/21/19 0001      Ambulation/Gait   Gait Comments  Gait training with SPC for sequencing steps, cane in Rt hand, cues to decreased pressure on cane to improve midline posture (100')      Lumbar Exercises: Stretches   Lower Trunk Rotation  5 reps;10 seconds    Lower Trunk Rotation Limitations  both sides    Piriformis Stretch  Left;2 reps;20 seconds    Piriformis Stretch Limitations  supine      Knee/Hip Exercises: Supine   Hip Adduction Isometric  Both;1 set;10 reps;Limitations    Hip Adduction Isometric Limitations  hooklying with ball    Bridges  Strengthening;Both;1 set;10 reps    Bridges Limitations  5 sec holds    Bridges with [employer]  Both;1 set;10 reps;Limitations    Other Supine Knee/Hip Exercises  heel dig for hip extensor activation, isometric, 10 x 5 sec holds      Manual Therapy   Manual Therapy  Soft tissue mobilization    Manual therapy comments  performed seperate from other interventions    Soft tissue mobilization  IASTM with massage stick to gluteus maximus on Lt        PT Education - 07/21/19 1725    Education Details  Educated on possible source of pain as patient's tenderness is at muscle insertion points and he had some pain with resisted hip external rotation. Educated on exercises and provided handout.    Person(s) Educated  Patient    Methods  Explanation;Handout    Comprehension  Verbalized understanding       PT Short Term Goals - 07/18/19 1615      PT SHORT TERM GOAL #1   Title  Patient will be independent with HEP, updated PRN, to improve activity tolerance, balance, and strength.    Time  2    Period  Weeks  Status  On-going    Target Date  07/27/19      PT SHORT TERM GOAL #2   Title  Patient will improve SLS to 10 seconds or greater  for bil LE, to improve stability and safety with gait and stair negotiation.    Time  3    Period  Weeks    Status  On-going    Target Date  08/03/19      PT SHORT TERM GOAL #3   Title  Patient will improve hip strength bil by 1/2 grade or more to indicate significant improvement in strength for greater ease of gait and stair mobility.    Time  3    Period  Weeks    Status  On-going        PT Long Term Goals - 07/18/19 1616      PT LONG TERM GOAL #1   Title  Patient will report being able to walk his dogs for 30 minutes or greater with no increase in back pain to improve QOL.    Time  6    Period  Weeks    Status  On-going      PT LONG TERM GOAL #2   Title  Patient will be able to prepare/cook a meal for greater than 1 hour to improve QOL and participation in enjoyable activities.    Time  6    Period  Weeks    Status  On-going      PT LONG TERM GOAL #3   Title  Patient will improve FOTO score by 10 points or greater to indicate significant reduction in self reported limitations for mobility related to low back pain.    Time  6    Period  Weeks    Status  On-going        Plan - 07/21/19 1727    Clinical Impression Statement  Patient arrived with continued Lt hip pain however reports the exercises appear to be helping. Assessment was done at start of session and pt was negative for intra-articular testing with Lt hip. He did have tenderness to greater trochanter and ischial tuberosity on Lt side and had pain with resistance testing of hip ext and external rotation likely indicating gluteus med/max involvement. Session continued with stretches for mobility and soft tissue mobilization initiated to address pain. Pt also performed [facility] training for gait and had improved quality with reduced antalgia. He will continue to benefit from skilled PT interventions to progress towards goals.    Personal Factors and Comorbidities  Age;Comorbidity 2    Comorbidities  HTN, DM     Examination-Activity Limitations  Stand;Stairs;Locomotion Level    Examination-Participation Restrictions  Meal Prep;Community Activity;Other   walking his dogs   Stability/Clinical Decision Making  Stable/Uncomplicated    Rehab Potential  Good    PT Frequency  2x / week    PT Duration  6 weeks    PT Treatment/Interventions  ADLs/Self Care Home Management;Aquatic Therapy;Cryotherapy;Electrical Stimulation;Moist Heat;DME Instruction;Traction;Gait training;Stair training;Functional mobility training;Therapeutic activities;Therapeutic exercise;Balance training;Neuromuscular re-education;Patient/family education;Manual techniques;Passive range of motion;Dry needling;Joint Manipulations;Spinal Manipulations    PT Next Visit Plan  Continue to progress glut strengthening and stretching. Perform manual PRN for pain. Gradually progress to dead lift teaching hip hinge technique. Begin standing functional exercises and hip ext/abd strengthening when sorenss subsides.    PT Home Exercise Plan  07/13/19 - lumbar extension standing;    Consulted and Agree with Plan of Care  Patient  Patient will benefit from skilled therapeutic intervention in order to improve the following deficits and impairments:  Decreased activity tolerance, Decreased balance, Decreased range of motion, Decreased mobility, Difficulty walking, Decreased strength  Visit Diagnosis: Muscle weakness (generalized)  Chronic midline low back pain with bilateral sciatica     Problem List Patient Active Problem List   Diagnosis Date Noted  . Other fatigue 02/14/2018  . Shortness of breath on exertion 02/14/2018  . Type 2 diabetes mellitus with hyperglycemia, with long-term current use of insulin ([place]) 02/14/2018  . Abnormal EKG 02/14/2018  . Maxillary sinusitis, acute 12/17/2015  . Cough 12/17/2015  . Fever 12/17/2015  . Diabetes mellitus without complication ([place]) [id number]  . Tooth pain 06/21/2013  . Orthostatic  lightheadedness 05/17/2013  . Health maintenance examination 05/17/2013  . Groin strain 11/10/2012  . Hypogonadism, male 01/11/2012  . Type II or unspecified type diabetes mellitus without mention of complication, not stated as uncontrolled 07/08/2011  . HTN (hypertension), benign 07/08/2011  . Hyperlipidemia 07/08/2011  . BPH (benign prostatic hypertrophy) 07/08/2011  . Hx of adenomatous colonic polyps 07/08/2011    [name] [name], PT, DPT, [facility] Physical Therapist with [facility]  07/21/2019 5:27 PM    [place] [address] [place], [place], [address] Phone: [phone]   Fax:  [phone]  Name: [name] [name] MRN: [id number] Date of Birth: 1951-07-30

## 2019-07-26 ENCOUNTER — Ambulatory Visit (HOSPITAL_COMMUNITY): Payer: Medicare Other | Admitting: Physical Therapy

## 2019-07-26 ENCOUNTER — Other Ambulatory Visit: Payer: Self-pay

## 2019-07-26 DIAGNOSIS — M5442 Lumbago with sciatica, left side: Secondary | ICD-10-CM | POA: Diagnosis not present

## 2019-07-26 DIAGNOSIS — M5441 Lumbago with sciatica, right side: Secondary | ICD-10-CM | POA: Diagnosis not present

## 2019-07-26 DIAGNOSIS — G8929 Other chronic pain: Secondary | ICD-10-CM | POA: Diagnosis not present

## 2019-07-26 DIAGNOSIS — R791 Abnormal coagulation profile: Secondary | ICD-10-CM | POA: Diagnosis not present

## 2019-07-26 DIAGNOSIS — M6281 Muscle weakness (generalized): Secondary | ICD-10-CM

## 2019-07-26 NOTE — Therapy (Signed)
[place], [place], [address] Phone: [phone]   Fax:  [phone]  Physical Therapy Treatment  Patient Details  Name: [name] [name] MRN: [id number] Date of Birth: [birth year] Referring Provider (PT): Erline Levine, MD   Encounter Date: 07/26/2019  PT End of Session - 07/26/19 1136    Visit Number  4    Number of Visits  12    Date for PT Re-Evaluation  08/24/19    Authorization Type  Medicare Part A and B    Authorization Time Period  07/13/19-08/24/19    Authorization - Visit Number  4    Authorization - Number of Visits  10    PT Start Time  1105    PT Stop Time  1150    PT Time Calculation (min)  45 min    Activity Tolerance  Patient tolerated treatment well    Behavior During Therapy  [facility] for tasks assessed/performed       Past Medical History:  Diagnosis Date  . Allergic rhinitis, unspecified    Allergy testing via allergist 04/2017--mostly neg.  Dr. Donneta Romberg recommended referral to ENT so i did this.  . Back pain    LBP + bilat hip and thigh pain-->Dr. Vertell Limber to do MRI as of 05/2019  . BPH (benign prostatic hypertrophy)   . Chronic nasal congestion   . DDD (degenerative disc disease), lumbar 2010   laminectomy 01/2009  . Depression    Hospitalized for suicidal ideation 28.  Has been on lexapro since 10/2008.  . Diabetes mellitus type II    Dx'd approximately 06/2008.  No retinopathy as of 02/2017 ophth.  . Erectile dysfunction   . History of colon polyps 01/2016   Recall 3 yrs  . History of pneumonia 2008   Hospitalized  . History of scarlet fever    age 30  . Hyperlipidemia, mixed 1993   myalgias on pravastatin  . Hypertension 2009   "marked chronic cardiomegaly" on CXR 01/2009 per old records.  . 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 Insomnia   . Keratoconus    dx'd 1973  . Memory changes   . OSA on CPAP 2004; 2019   Back on CPAP 04/2018  . Osteoarthritis of  both knees    Mild plain film changes in medial compartment bilat  . Restless leg   . Rhinitis, chronic   . 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 APPENDECTOMY  1972  . COLONOSCOPY  X 4   Most recent was 2007 [place], [place]), with removal of polyps in the first two.  02/21/16: tubular adenoma.  Recall 3 yrs (Dr. Ardis Hughs).  . 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 ELECTROCARDIOGRAM  01/29/2009   NORMAL  . LUMBAR LAMINECTOMY  01/2009  . TONSILLECTOMY AND ADENOIDECTOMY     age 52  . TRANSTHORACIC ECHOCARDIOGRAM  02/22/2018   EF 55-60%, grd I DD  . URETHRAL DILATION     2 as a child, one as an adult  . VASECTOMY  1994  . VITRECTOMY      There were no vitals filed for this visit.  Subjective Assessment - 07/26/19 1109    Subjective  pt states he was still sore after last session with increased pain; took sat and sun off from exericses and  resumed on Monday with much improvement.  States he is overall pleased with his progress.  Still slight discomfort but not the sharp stabbing pain he was having.    Currently in Pain?  Yes    Pain Score  2     Pain Location  Hip    Pain Orientation  Right    Pain Descriptors / Indicators  Sore;Tender                       [facility] Adult PT Treatment/Exercise - 07/26/19 0001      Lumbar Exercises: Stretches   Active Hamstring Stretch  Right;Left    Active Hamstring Stretch Limitations  shown in seated, standing and supine    Lower Trunk Rotation  5 reps;10 seconds    Lower Trunk Rotation Limitations  both sides    Piriformis Stretch Limitations  reviewed      Knee/Hip Exercises: Standing   Heel Raises  10 reps    Heel Raises Limitations  toeraises 10 reps    Hip Abduction  15 reps    Hip Extension  15 reps    SLS with Vectors  5x3"       Knee/Hip Exercises: Supine   Hip Adduction Isometric  Both;1 set;10 reps;Limitations    Hip  Adduction Isometric Limitations  hooklying with ball    Bridges  Strengthening;Both;1 set;10 reps    Bridges Limitations  5 sec holds    Bridges with [employer]  Both;1 set;10 reps;Limitations      Manual Therapy   Manual Therapy  Soft tissue mobilization    Manual therapy comments  performed seperate from other interventions    Soft tissue mobilization  IASTM with massage ball to gluteus maximus on Lt               PT Short Term Goals - 07/18/19 1615      PT SHORT TERM GOAL #1   Title  Patient will be independent with HEP, updated PRN, to improve activity tolerance, balance, and strength.    Time  2    Period  Weeks    Status  On-going    Target Date  07/27/19      PT SHORT TERM GOAL #2   Title  Patient will improve SLS to 10 seconds or greater for bil LE, to improve stability and safety with gait and stair negotiation.    Time  3    Period  Weeks    Status  On-going    Target Date  08/03/19      PT SHORT TERM GOAL #3   Title  Patient will improve hip strength bil by 1/2 grade or more to indicate significant improvement in strength for greater ease of gait and stair mobility.    Time  3    Period  Weeks    Status  On-going        PT Long Term Goals - 07/18/19 1616      PT LONG TERM GOAL #1   Title  Patient will report being able to walk his dogs for 30 minutes or greater with no increase in back pain to improve QOL.    Time  6    Period  Weeks    Status  On-going      PT LONG TERM GOAL #2   Title  Patient will be able to prepare/cook a meal for greater than 1 hour to improve QOL and participation in enjoyable activities.  Time  6    Period  Weeks    Status  On-going      PT LONG TERM GOAL #3   Title  Patient will improve FOTO score by 10 points or greater to indicate significant reduction in self reported limitations for mobility related to low back pain.    Time  6    Period  Weeks    Status  On-going            Plan - 07/26/19 1211     Clinical Impression Statement  Pt overall improving with reduced symptoms today.  Pt with questions regarding returning to walking program.  Advised to start at 1/4 his normal and work his way back up week by week according to sorness/pain.  Added standing strengthening exercises this session all without issues or reports of pain.  Instructed with various hamstring stretches so can be completed in different positions.  pt with tightn hamstrings.  No tenderness or trigger points located with manual this session.    Personal Factors and Comorbidities  Age;Comorbidity 2    Comorbidities  HTN, DM    Examination-Activity Limitations  Stand;Stairs;Locomotion Level    Examination-Participation Restrictions  Meal Prep;Community Activity;Other   walking his dogs   Stability/Clinical Decision Making  Stable/Uncomplicated    Rehab Potential  Good    PT Frequency  2x / week    PT Duration  6 weeks    PT Treatment/Interventions  ADLs/Self Care Home Management;Aquatic Therapy;Cryotherapy;Electrical Stimulation;Moist Heat;DME Instruction;Traction;Gait training;Stair training;Functional mobility training;Therapeutic activities;Therapeutic exercise;Balance training;Neuromuscular re-education;Patient/family education;Manual techniques;Passive range of motion;Dry needling;Joint Manipulations;Spinal Manipulations    PT Next Visit Plan  Continue to progress glut strengthening and stretching. Perform manual PRN for pain. Gradually progress to dead lift teaching hip hinge technique. Begin standing functional exercises and hip ext/abd strengthening when sorenss subsides.    PT Home Exercise Plan  07/13/19 - lumbar extension standing;    Consulted and Agree with Plan of Care  Patient       Patient will benefit from skilled therapeutic intervention in order to improve the following deficits and impairments:  Decreased activity tolerance, Decreased balance, Decreased range of motion, Decreased mobility, Difficulty walking,  Decreased strength  Visit Diagnosis: Muscle weakness (generalized)  Chronic midline low back pain with bilateral sciatica     Problem List Patient Active Problem List   Diagnosis Date Noted  . Other fatigue 02/14/2018  . Shortness of breath on exertion 02/14/2018  . Type 2 diabetes mellitus with hyperglycemia, with long-term current use of insulin ([place]) 02/14/2018  . Abnormal EKG 02/14/2018  . Maxillary sinusitis, acute 12/17/2015  . Cough 12/17/2015  . Fever 12/17/2015  . Diabetes mellitus without complication ([place]) [id number]  . Tooth pain 06/21/2013  . Orthostatic lightheadedness 05/17/2013  . Health maintenance examination 05/17/2013  . Groin strain 11/10/2012  . Hypogonadism, male 01/11/2012  . Type II or unspecified type diabetes mellitus without mention of complication, not stated as uncontrolled 07/08/2011  . HTN (hypertension), benign 07/08/2011  . Hyperlipidemia 07/08/2011  . BPH (benign prostatic hypertrophy) 07/08/2011  . Hx of adenomatous colonic polyps 07/08/2011   Teena Irani, PTA/CLT [phone]  Teena Irani 07/26/2019, 12:30 PM  [place] [address] [place], [place], [address] Phone: [phone]   Fax:  [phone]  Name: [name] [name] MRN: [id number] Date of Birth: July 09, 1951

## 2019-07-28 ENCOUNTER — Encounter (HOSPITAL_COMMUNITY): Payer: Self-pay

## 2019-07-28 ENCOUNTER — Other Ambulatory Visit: Payer: Self-pay

## 2019-07-28 ENCOUNTER — Telehealth (HOSPITAL_COMMUNITY): Payer: Self-pay

## 2019-07-28 ENCOUNTER — Ambulatory Visit (HOSPITAL_COMMUNITY): Payer: Medicare Other

## 2019-07-28 DIAGNOSIS — M6281 Muscle weakness (generalized): Secondary | ICD-10-CM

## 2019-07-28 DIAGNOSIS — M5442 Lumbago with sciatica, left side: Secondary | ICD-10-CM | POA: Diagnosis not present

## 2019-07-28 DIAGNOSIS — G8929 Other chronic pain: Secondary | ICD-10-CM

## 2019-07-28 DIAGNOSIS — R791 Abnormal coagulation profile: Secondary | ICD-10-CM | POA: Diagnosis not present

## 2019-07-28 DIAGNOSIS — M5441 Lumbago with sciatica, right side: Secondary | ICD-10-CM | POA: Diagnosis not present

## 2019-07-28 NOTE — Telephone Encounter (Signed)
No show, called and left message concerning missed apt.  Reminded next apt date and time as well as details with no show policy.  Pt showed for apt at 1136, 20 minutes late.  [address] [place]; [facility] [phone]

## 2019-07-28 NOTE — Therapy (Signed)
[place], [place], [address] Phone: [phone]   Fax:  [phone]  Physical Therapy Treatment  Patient Details  Name: [name] [name] MRN: [id number] Date of Birth: 01-Nov-1951 Referring Provider (PT): Erline Levine, MD   Encounter Date: 07/28/2019  PT End of Session - 07/28/19 1145    Visit Number  5    Number of Visits  12    Date for PT Re-Evaluation  08/24/19    Authorization Type  Medicare Part A and B    Authorization Time Period  07/13/19-08/24/19    Authorization - Visit Number  5    Authorization - Number of Visits  10    PT Start Time  1140   pt late for apt   PT Stop Time  1205    PT Time Calculation (min)  25 min    Activity Tolerance  Patient tolerated treatment well    Behavior During Therapy  [facility] for tasks assessed/performed       Past Medical History:  Diagnosis Date  . Allergic rhinitis, unspecified    Allergy testing via allergist 04/2017--mostly neg.  Dr. Donneta Romberg recommended referral to ENT so i did this.  . Back pain    LBP + bilat hip and thigh pain-->Dr. Vertell Limber to do MRI as of 05/2019  . BPH (benign prostatic hypertrophy)   . Chronic nasal congestion   . DDD (degenerative disc disease), lumbar 2010   laminectomy 01/2009  . Depression    Hospitalized for suicidal ideation 45.  Has been on lexapro since 10/2008.  . Diabetes mellitus type II    Dx'd approximately 06/2008.  No retinopathy as of 02/2017 ophth.  . Erectile dysfunction   . History of colon polyps 01/2016   Recall 3 yrs  . History of pneumonia 2008   Hospitalized  . History of scarlet fever    age 69  . Hyperlipidemia, mixed 1993   myalgias on pravastatin  . Hypertension 2009   "marked chronic cardiomegaly" on CXR 01/2009 per old records.  . 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 Insomnia   . Keratoconus    dx'd 1973  . Memory changes   . OSA on CPAP 2004; 2019   Back on CPAP 04/2018  .  Osteoarthritis of both knees    Mild plain film changes in medial compartment bilat  . Restless leg   . Rhinitis, chronic   . 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 APPENDECTOMY  1972  . COLONOSCOPY  X 4   Most recent was 2007 [place], [place]), with removal of polyps in the first two.  02/21/16: tubular adenoma.  Recall 3 yrs (Dr. Ardis Hughs).  . 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 ELECTROCARDIOGRAM  01/29/2009   NORMAL  . LUMBAR LAMINECTOMY  01/2009  . TONSILLECTOMY AND ADENOIDECTOMY     age 56  . TRANSTHORACIC ECHOCARDIOGRAM  02/22/2018   EF 55-60%, grd I DD  . URETHRAL DILATION     2 as a child, one as an adult  . VASECTOMY  1994  . VITRECTOMY      There were no vitals filed for this visit.  Subjective Assessment - 07/28/19 1142    Subjective  Pt late for apt, stated he lost time this morning.  Pt reports dull pain  following standing for longer than 10 minutes.  Reports compliance with HEP.    Patient Stated Goals  to be able to walk his dogs again and cook/prepare large meals when entertaining friends/family    Currently in Pain?  Yes    Pain Score  1     Pain Location  Hip    Pain Orientation  Right    Pain Descriptors / Indicators  Dull    Pain Type  Chronic pain    Pain Onset  More than a month ago    Pain Frequency  Constant         [facility] PT Assessment - 07/28/19 0001      Assessment   Medical Diagnosis  Low Back Pain    Referring Provider (PT)  Erline Levine, MD    Next MD Visit  End of September    Prior Therapy  Several years ago for back after surgery in 2010.                   [facility] Adult PT Treatment/Exercise - 07/28/19 0001      Knee/Hip Exercises: Standing   Hip Abduction  15 reps    Hip Extension  15 reps    Functional Squat  10 reps    Functional Squat Limitations  front of chair for mechanics    SLS with Vectors  5x3" 1 HHA    Other  Standing Knee Exercises  tandem stance 2x 30"    Other Standing Knee Exercises  Controlled STS 10x no HHA               PT Short Term Goals - 07/18/19 1615      PT SHORT TERM GOAL #1   Title  Patient will be independent with HEP, updated PRN, to improve activity tolerance, balance, and strength.    Time  2    Period  Weeks    Status  On-going    Target Date  07/27/19      PT SHORT TERM GOAL #2   Title  Patient will improve SLS to 10 seconds or greater for bil LE, to improve stability and safety with gait and stair negotiation.    Time  3    Period  Weeks    Status  On-going    Target Date  08/03/19      PT SHORT TERM GOAL #3   Title  Patient will improve hip strength bil by 1/2 grade or more to indicate significant improvement in strength for greater ease of gait and stair mobility.    Time  3    Period  Weeks    Status  On-going        PT Long Term Goals - 07/18/19 1616      PT LONG TERM GOAL #1   Title  Patient will report being able to walk his dogs for 30 minutes or greater with no increase in back pain to improve QOL.    Time  6    Period  Weeks    Status  On-going      PT LONG TERM GOAL #2   Title  Patient will be able to prepare/cook a meal for greater than 1 hour to improve QOL and participation in enjoyable activities.    Time  6    Period  Weeks    Status  On-going      PT LONG TERM GOAL #3   Title  Patient will improve FOTO score  by 10 points or greater to indicate significant reduction in self reported limitations for mobility related to low back pain.    Time  6    Period  Weeks    Status  On-going            Plan - 07/28/19 1210    Clinical Impression Statement  Pt late for apt, unable to complete whole POC.  Session focus on functional strengthening for core and gluteal strengthening.  Added functional squats with min cueing for mechanics for gluteal strengthening as well as tandem stance for core and glut med.  No reports of increased  pain through session.    Personal Factors and Comorbidities  Age;Comorbidity 2    Comorbidities  HTN, DM    Examination-Activity Limitations  Stand;Stairs;Locomotion Level    Examination-Participation Restrictions  Meal Prep;Community Activity;Other   walking his dogs   Stability/Clinical Decision Making  Stable/Uncomplicated    Clinical Decision Making  Low    Rehab Potential  Good    PT Frequency  2x / week    PT Duration  6 weeks    PT Treatment/Interventions  ADLs/Self Care Home Management;Aquatic Therapy;Cryotherapy;Electrical Stimulation;Moist Heat;DME Instruction;Traction;Gait training;Stair training;Functional mobility training;Therapeutic activities;Therapeutic exercise;Balance training;Neuromuscular re-education;Patient/family education;Manual techniques;Passive range of motion;Dry needling;Joint Manipulations;Spinal Manipulations    PT Next Visit Plan  Continue to progress glut strengthening and stretching. Perform manual PRN for pain. Gradually progress to dead lift teaching hip hinge technique. Begin standing functional exercises and hip ext/abd strengthening when sorenss subsides.    PT Home Exercise Plan  07/13/19 - lumbar extension standing;       Patient will benefit from skilled therapeutic intervention in order to improve the following deficits and impairments:  Decreased activity tolerance, Decreased balance, Decreased range of motion, Decreased mobility, Difficulty walking, Decreased strength  Visit Diagnosis: Chronic midline low back pain with bilateral sciatica  Muscle weakness (generalized)     Problem List Patient Active Problem List   Diagnosis Date Noted  . Other fatigue 02/14/2018  . Shortness of breath on exertion 02/14/2018  . Type 2 diabetes mellitus with hyperglycemia, with long-term current use of insulin ([place]) 02/14/2018  . Abnormal EKG 02/14/2018  . Maxillary sinusitis, acute 12/17/2015  . Cough 12/17/2015  . Fever 12/17/2015  . Diabetes mellitus  without complication ([place]) [id number]  . Tooth pain 06/21/2013  . Orthostatic lightheadedness 05/17/2013  . Health maintenance examination 05/17/2013  . Groin strain 11/10/2012  . Hypogonadism, male 01/11/2012  . Type II or unspecified type diabetes mellitus without mention of complication, not stated as uncontrolled 07/08/2011  . HTN (hypertension), benign 07/08/2011  . Hyperlipidemia 07/08/2011  . BPH (benign prostatic hypertrophy) 07/08/2011  . Hx of adenomatous colonic polyps 07/08/2011   Ihor Austin, LPTA; CBIS [phone]  Aldona Lento 07/28/2019, 12:15 PM  [place], [place], [address] Phone: [phone]   Fax:  [phone]  Name: Amad Formoso MRN: [id number] Date of Birth: Jul 25, 1951

## 2019-08-01 ENCOUNTER — Other Ambulatory Visit: Payer: Self-pay | Admitting: Family Medicine

## 2019-08-02 ENCOUNTER — Ambulatory Visit (HOSPITAL_COMMUNITY): Payer: Medicare Other | Attending: Neurosurgery

## 2019-08-02 ENCOUNTER — Encounter (HOSPITAL_COMMUNITY): Payer: Self-pay

## 2019-08-02 ENCOUNTER — Other Ambulatory Visit: Payer: Self-pay

## 2019-08-02 DIAGNOSIS — R791 Abnormal coagulation profile: Secondary | ICD-10-CM | POA: Insufficient documentation

## 2019-08-02 DIAGNOSIS — M5441 Lumbago with sciatica, right side: Secondary | ICD-10-CM | POA: Insufficient documentation

## 2019-08-02 DIAGNOSIS — G8929 Other chronic pain: Secondary | ICD-10-CM | POA: Insufficient documentation

## 2019-08-02 DIAGNOSIS — M5442 Lumbago with sciatica, left side: Secondary | ICD-10-CM | POA: Insufficient documentation

## 2019-08-02 DIAGNOSIS — M6281 Muscle weakness (generalized): Secondary | ICD-10-CM | POA: Insufficient documentation

## 2019-08-02 NOTE — Therapy (Signed)
[name], [place], [address] Phone: [phone]   Fax:  [phone]  Physical Therapy Treatment  Patient Details  Name: [name] [name] MRN: [id number] Date of Birth: June 17, 1951 Referring Provider (PT): Erline Levine, MD   Encounter Date: 08/02/2019  PT End of Session - 08/02/19 1106    Visit Number  6    Number of Visits  12    Date for PT Re-Evaluation  08/24/19    Authorization Type  Medicare Part A and B    Authorization Time Period  07/13/19-08/24/19    Authorization - Visit Number  6    Authorization - Number of Visits  10    PT Start Time  [id number]    PT Stop Time  1129    PT Time Calculation (min)  42 min    Activity Tolerance  Patient tolerated treatment well    Behavior During Therapy  [facility] for tasks assessed/performed       Past Medical History:  Diagnosis Date  . Allergic rhinitis, unspecified    Allergy testing via allergist 04/2017--mostly neg.  Dr. Donneta Romberg recommended referral to ENT so i did this.  . Back pain    LBP + bilat hip and thigh pain-->Dr. Vertell Limber to do MRI as of 05/2019  . BPH (benign prostatic hypertrophy)   . Chronic nasal congestion   . DDD (degenerative disc disease), lumbar 2010   laminectomy 01/2009  . Depression    Hospitalized for suicidal ideation 16.  Has been on lexapro since 10/2008.  . Diabetes mellitus type II    Dx'd approximately 06/2008.  No retinopathy as of 02/2017 ophth.  . Erectile dysfunction   . History of colon polyps 01/2016   Recall 3 yrs  . History of pneumonia 2008   Hospitalized  . History of scarlet fever    age 65  . Hyperlipidemia, mixed 1993   myalgias on pravastatin  . Hypertension 2009   "marked chronic cardiomegaly" on CXR 01/2009 per old records.  . 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 Insomnia   . Keratoconus    dx'd 1973  . Memory changes   . OSA on CPAP 2004; 2019   Back on CPAP 04/2018  . Osteoarthritis of both  knees    Mild plain film changes in medial compartment bilat  . Restless leg   . Rhinitis, chronic   . 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 APPENDECTOMY  1972  . COLONOSCOPY  X 4   Most recent was 2007 [place], [place]), with removal of polyps in the first two.  02/21/16: tubular adenoma.  Recall 3 yrs (Dr. Ardis Hughs).  . 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 ELECTROCARDIOGRAM  01/29/2009   NORMAL  . LUMBAR LAMINECTOMY  01/2009  . TONSILLECTOMY AND ADENOIDECTOMY     age 39  . TRANSTHORACIC ECHOCARDIOGRAM  02/22/2018   EF 55-60%, grd I DD  . URETHRAL DILATION     2 as a child, one as an adult  . VASECTOMY  1994  . VITRECTOMY      There were no vitals filed for this visit.  Subjective Assessment - 08/02/19 1048    Subjective  Patient reports he is achy form his HEP last night but no major pain. He took a walk a couple  days ago, about 4-5 blocks and had some pain near the end just in the Lt hip and low back.    Pertinent History  L4-5 laminectomy    Limitations  Walking;Standing    Patient Stated Goals  to be able to walk his dogs again and cook/prepare large meals when entertaining friends/family    Currently in Pain?  Yes    Pain Score  1     Pain Location  Hip    Pain Orientation  Left    Pain Onset  More than a month ago         [facility] Adult PT Treatment/Exercise - 08/02/19 0001      Lumbar Exercises: Stretches   Active Hamstring Stretch  Right;Left;2 reps;30 seconds    Active Hamstring Stretch Limitations  supine with rope    Lower Trunk Rotation  5 reps;10 seconds    Lower Trunk Rotation Limitations  both sides    Piriformis Stretch  Right;Left;2 reps;20 seconds      Knee/Hip Exercises: Standing   Lateral Step Up  Both;1 set;15 reps;Hand Hold: 0;Step Height: 6"    Functional Squat  2 sets;15 reps    Functional Squat Limitations  in front of table, taps for eccentric  control with lowering    SLS with Vectors  5x bil with no UE support, 3 way vector, 3x bil with fingertip support 1 UE, 3 way vector, 3 sec holds    Other Standing Knee Exercises  Hip Hinge Progression: 2x 10 reps wtih dowel at back, 1x 10 reps with 5 sec isometric hold in hip hinge position, 1x 10 reps with scapular retraction at end of hip hinge before standing       PT Education - 08/02/19 1052    Education Details  Educated on form with squats and initial hip hinge training. Educated on updated HEP with hip hinge exercise    Person(s) Educated  Patient    Methods  Explanation;Tactile cues;Verbal cues    Comprehension  Verbalized understanding;Need further instruction       PT Short Term Goals - 07/18/19 1615      PT SHORT TERM GOAL #1   Title  Patient will be independent with HEP, updated PRN, to improve activity tolerance, balance, and strength.    Time  2    Period  Weeks    Status  On-going    Target Date  07/27/19      PT SHORT TERM GOAL #2   Title  Patient will improve SLS to 10 seconds or greater for bil LE, to improve stability and safety with gait and stair negotiation.    Time  3    Period  Weeks    Status  On-going    Target Date  08/03/19      PT SHORT TERM GOAL #3   Title  Patient will improve hip strength bil by 1/2 grade or more to indicate significant improvement in strength for greater ease of gait and stair mobility.    Time  3    Period  Weeks    Status  On-going        PT Long Term Goals - 07/18/19 1616      PT LONG TERM GOAL #1   Title  Patient will report being able to walk his dogs for 30 minutes or greater with no increase in back pain to improve QOL.    Time  6    Period  Weeks    Status  On-going      PT LONG TERM GOAL #2   Title  Patient will be able to prepare/cook a meal for greater than 1 hour to improve QOL and participation in enjoyable activities.    Time  6    Period  Weeks    Status  On-going      PT LONG TERM GOAL #3   Title   Patient will improve FOTO score by 10 points or greater to indicate significant reduction in self reported limitations for mobility related to low back pain.    Time  6    Period  Weeks    Status  On-going        Plan - 08/02/19 1107    Clinical Impression Statement  Patient reports low pain with mild aching from exercises and has returned to walking more at home. He was able to progress today with exercises and initiated hip hinge technique training. Dowel used along spine at start and pat was able to advance away from tactile cue. Tactile cue required for scapular retraction with return to stand form hip hinge posture. Squat training was also advanced with cues for form and eccentric lowering for controled taps to table. Hip strengthening was advanced today as well with lateral step ups to target glut medius. Pt struggled with Rt step ups greater than Lt and was challenged in SLS activity without UE support. Fingertip support was enough for pt to complete vector stance with no LOB. Pt will continue to require review on form prior to loading deadlift.    Personal Factors and Comorbidities  Age;Comorbidity 2    Comorbidities  HTN, DM    Examination-Activity Limitations  Stand;Stairs;Locomotion Level    Examination-Participation Restrictions  Meal Prep;Community Activity;Other   walking his dogs   Stability/Clinical Decision Making  Stable/Uncomplicated    Rehab Potential  Good    PT Frequency  2x / week    PT Duration  6 weeks    PT Treatment/Interventions  ADLs/Self Care Home Management;Aquatic Therapy;Cryotherapy;Electrical Stimulation;Moist Heat;DME Instruction;Traction;Gait training;Stair training;Functional mobility training;Therapeutic activities;Therapeutic exercise;Balance training;Neuromuscular re-education;Patient/family education;Manual techniques;Passive range of motion;Dry needling;Joint Manipulations;Spinal Manipulations    PT Next Visit Plan  Continue to progress hip hinge  exercises and progress to loaded dead lift as able. Begin standing functional exercises and hip ext/abd strengthening when sorenss subsides. Provde hip hinge exercise for HEP next session.    PT Home Exercise Plan  07/13/19 - lumbar extension standing; hamstring stretch    Consulted and Agree with Plan of Care  Patient       Patient will benefit from skilled therapeutic intervention in order to improve the following deficits and impairments:  Decreased activity tolerance, Decreased balance, Decreased range of motion, Decreased mobility, Difficulty walking, Decreased strength  Visit Diagnosis: Chronic midline low back pain with bilateral sciatica  Muscle weakness (generalized)     Problem List Patient Active Problem List   Diagnosis Date Noted  . Other fatigue 02/14/2018  . Shortness of breath on exertion 02/14/2018  . Type 2 diabetes mellitus with hyperglycemia, with long-term current use of insulin ([place]) 02/14/2018  . Abnormal EKG 02/14/2018  . Maxillary sinusitis, acute 12/17/2015  . Cough 12/17/2015  . Fever 12/17/2015  . Diabetes mellitus without complication ([place]) [id number]  . Tooth pain 06/21/2013  . Orthostatic lightheadedness 05/17/2013  . Health maintenance examination 05/17/2013  . Groin strain 11/10/2012  . Hypogonadism, male 01/11/2012  . Type II or unspecified type diabetes mellitus without mention of complication, not stated  as uncontrolled 07/08/2011  . HTN (hypertension), benign 07/08/2011  . Hyperlipidemia 07/08/2011  . BPH (benign prostatic hypertrophy) 07/08/2011  . Hx of adenomatous colonic polyps 07/08/2011    Kipp Brood, PT, DPT, [facility] Physical Therapist with [facility]  08/02/2019 11:09 AM    [place] [address] [place], [place], [address] Phone: [phone]   Fax:  [phone]  Name: Ryheem Ferroni MRN: [id number] Date of Birth: 18-Dec-1950

## 2019-08-04 ENCOUNTER — Encounter (HOSPITAL_COMMUNITY): Payer: Self-pay

## 2019-08-04 ENCOUNTER — Ambulatory Visit (HOSPITAL_COMMUNITY): Payer: Medicare Other

## 2019-08-04 ENCOUNTER — Other Ambulatory Visit: Payer: Self-pay

## 2019-08-04 DIAGNOSIS — G8929 Other chronic pain: Secondary | ICD-10-CM | POA: Diagnosis not present

## 2019-08-04 DIAGNOSIS — M5441 Lumbago with sciatica, right side: Secondary | ICD-10-CM | POA: Diagnosis not present

## 2019-08-04 DIAGNOSIS — R791 Abnormal coagulation profile: Secondary | ICD-10-CM | POA: Diagnosis not present

## 2019-08-04 DIAGNOSIS — M6281 Muscle weakness (generalized): Secondary | ICD-10-CM

## 2019-08-04 DIAGNOSIS — M5442 Lumbago with sciatica, left side: Secondary | ICD-10-CM | POA: Diagnosis not present

## 2019-08-04 NOTE — Therapy (Signed)
[place], [place], [address] Phone: [phone]   Fax:  [phone]  Physical Therapy Treatment  & Progress Note  Patient Details  Name: [name] [name] MRN: [id number] Date of Birth: [birth year] Referring Provider (PT): Erline Levine, MD   Encounter Date: 08/04/2019  Progress Note Reporting Period 07/13/19 to 08/04/19  See note below for Objective Data and Assessment of Progress/Goals.    PT End of Session - 08/04/19 1113    Visit Number  7    Number of Visits  12    Date for PT Re-Evaluation  08/24/19    Authorization Type  Medicare Part A and B    Authorization Time Period  07/13/19-08/24/19    Authorization - Visit Number  1    Authorization - Number of Visits  10    PT Start Time  1048    PT Stop Time  1130    PT Time Calculation (min)  42 min    Activity Tolerance  Patient tolerated treatment well    Behavior During Therapy  WFL for tasks assessed/performed       Past Medical History:  Diagnosis Date  . Allergic rhinitis, unspecified    Allergy testing via allergist 04/2017--mostly neg.  Dr. Donneta Romberg recommended referral to ENT so i did this.  . Back pain    LBP + bilat hip and thigh pain-->Dr. Vertell Limber to do MRI as of 05/2019  . BPH (benign prostatic hypertrophy)   . Chronic nasal congestion   . DDD (degenerative disc disease), lumbar 2010   laminectomy 01/2009  . Depression    Hospitalized for suicidal ideation 77.  Has been on lexapro since 10/2008.  . Diabetes mellitus type II    Dx'd approximately 06/2008.  No retinopathy as of 02/2017 ophth.  . Erectile dysfunction   . History of colon polyps 01/2016   Recall 3 yrs  . History of pneumonia 2008   Hospitalized  . History of scarlet fever    age 30  . Hyperlipidemia, mixed 1993   myalgias on pravastatin  . Hypertension 2009   "marked chronic cardiomegaly" on CXR 01/2009 per old records.  . Hypogonadism, male 01/11/2012   Axiron trial started  03/2012  . Insomnia   . Keratoconus    dx'd 1973  . Memory changes   . OSA on CPAP 2004; 2019   Back on CPAP 04/2018  . Osteoarthritis of both knees    Mild plain film changes in medial compartment bilat  . Restless leg   . Rhinitis, chronic   . 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 APPENDECTOMY  1972  . COLONOSCOPY  X 4   Most recent was 2007 [place], [place]), with removal of polyps in the first two.  02/21/16: tubular adenoma.  Recall 3 yrs (Dr. Ardis Hughs).  . 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 ELECTROCARDIOGRAM  01/29/2009   NORMAL  . LUMBAR LAMINECTOMY  01/2009  . TONSILLECTOMY AND ADENOIDECTOMY     age 23  . TRANSTHORACIC ECHOCARDIOGRAM  02/22/2018   EF 55-60%, grd I DD  . URETHRAL DILATION     2 as a child, one as an adult  . VASECTOMY  1994  . VITRECTOMY      There were no vitals filed for this visit.  Subjective Assessment - 08/04/19 1050  Subjective  Patient reports he was sore yesterday after his therapy session and he did exerises which seemed to decrease his soreness. He reports yesterday he worked in the kitchen for 1.5 hours and did not notice any increase in his pain.    Pertinent History  L4-5 laminectomy    Limitations  Walking;Standing    Patient Stated Goals  to be able to walk his dogs again and cook/prepare large meals when entertaining friends/family    Currently in Pain?  No/denies         [facility] PT Assessment - 08/04/19 0001      Assessment   Medical Diagnosis  Low Back Pain    Referring Provider (PT)  Erline Levine, MD    Next MD Visit  End of September    Prior Therapy  Several years ago for back after surgery in 2010.      Single Leg Stance   Comments  Rt LE = 7 sec; Lt LE = 5 sec   was Rt LE = 3 sec; Lt LE = 3 sec     Strength   Right Hip Flexion  5/5   5   Right Hip Extension  5/5   4   Right Hip ABduction  4+/5   4   Left Hip  Flexion  5/5   5   Left Hip Extension  5/5   4+   Left Hip ABduction  4+/5   4   Right/Left Knee  Right;Left    Right Knee Flexion  5/5    Right Knee Extension  5/5    Left Knee Flexion  5/5    Left Knee Extension  5/5    Right Ankle Dorsiflexion  5/5    Left Ankle Dorsiflexion  5/5      Palpation   Palpation comment  No tenderness to Lt hamstring, piriformins, glut max or med with palpation        [facility] Adult PT Treatment/Exercise - 08/04/19 0001      Lumbar Exercises: Stretches   Active Hamstring Stretch  Right;Left;2 reps;30 seconds    Active Hamstring Stretch Limitations  supine with rope      Lumbar Exercises: Seated   Other Seated Lumbar Exercises  Hip hinge setaed with pt holding dowel against back, 2x 10 reps      Knee/Hip Exercises: Standing   Functional Squat  2 sets;15 reps    Functional Squat Limitations  mat table taps, pt with improved carryover and form    SLS  5x bil LE, 2 cone taps, no UE support    Other Standing Knee Exercises  Hip Hinge Progression: 2x 10 reps at mat table for table tap, 1x 10 reps with wall at backa nd chair infront to deter knee felxion and stretch hamstrings with hip hinge, 1x 10 reps standing with pt holding dowel on back      Knee/Hip Exercises: Supine   Bridges  Strengthening;Both;10 reps;2 sets    Bridges Limitations  knee with decreaed felxion and heel drive to facilitate hamstrign contraction   5 sec holds       PT Education - 08/04/19 1111    Education Details  Educated on progress since evaluation and on plan for remainder of therapy. Educated on exercise form throughout and updated HEP.    Person(s) Educated  Patient    Methods  Explanation;Handout;Demonstration;Tactile cues    Comprehension  Verbalized understanding;Returned demonstration;Need further instruction       PT Short Term Goals -  07/18/19 1615      PT SHORT TERM GOAL #1   Title  Patient will be independent with HEP, updated PRN, to improve activity  tolerance, balance, and strength.    Time  2    Period  Weeks    Status  On-going    Target Date  07/27/19      PT SHORT TERM GOAL #2   Title  Patient will improve SLS to 10 seconds or greater for bil LE, to improve stability and safety with gait and stair negotiation.    Time  3    Period  Weeks    Status  On-going    Target Date  08/03/19      PT SHORT TERM GOAL #3   Title  Patient will improve hip strength bil by 1/2 grade or more to indicate significant improvement in strength for greater ease of gait and stair mobility.    Time  3    Period  Weeks    Status  On-going        PT Long Term Goals - 07/18/19 1616      PT LONG TERM GOAL #1   Title  Patient will report being able to walk his dogs for 30 minutes or greater with no increase in back pain to improve QOL.    Time  6    Period  Weeks    Status  On-going      PT LONG TERM GOAL #2   Title  Patient will be able to prepare/cook a meal for greater than 1 hour to improve QOL and participation in enjoyable activities.    Time  6    Period  Weeks    Status  On-going      PT LONG TERM GOAL #3   Title  Patient will improve FOTO score by 10 points or greater to indicate significant reduction in self reported limitations for mobility related to low back pain.    Time  6    Period  Weeks    Status  On-going         Plan - 08/04/19 1113    Clinical Impression Statement  Patient re-assessed today and noted to have improved strength for bil hips and reported improved tolerance to standing for prolonged periods with no increase in hip or back pain. He has made slight improvement in balance but remains greatly limited in SLS. Remainder of session focused on progression of hip hinge exercises with form to advance to deadlift at later session. His form was improved with pt holding dowel in seated position for hip hinge and this was added to HEP. He continues to report tightness in hamstrings bil and continued with hamstring  stretching, no tenderness reported with palpation this date. He will continue to benefit from skilled PT interventions to address ongoing impairments and progress towards goals.    Personal Factors and Comorbidities  Age;Comorbidity 2    Comorbidities  HTN, DM    Examination-Activity Limitations  Stand;Stairs;Locomotion Level    Examination-Participation Restrictions  Meal Prep;Community Activity;Other   walking his dogs   Stability/Clinical Decision Making  Stable/Uncomplicated    Rehab Potential  Good    PT Frequency  2x / week    PT Duration  6 weeks    PT Treatment/Interventions  ADLs/Self Care Home Management;Aquatic Therapy;Cryotherapy;Electrical Stimulation;Moist Heat;DME Instruction;Traction;Gait training;Stair training;Functional mobility training;Therapeutic activities;Therapeutic exercise;Balance training;Neuromuscular re-education;Patient/family education;Manual techniques;Passive range of motion;Dry needling;Joint Manipulations;Spinal Manipulations    PT Next Visit Plan  Continue to progress hip hinge exercises and progress to loaded dead lift as able. Progress to loaded hip hinge next session. Pt has 2 more session b/f vacation and might want to make follow up for after vacation.    PT Home Exercise Plan  07/13/19 - lumbar extension standing; hamstring stretch; 08/04/19 - hip hinge seated and standing with dowel, chair taps/squat, hamstring stretch with rope    Consulted and Agree with Plan of Care  Patient       Patient will benefit from skilled therapeutic intervention in order to improve the following deficits and impairments:  Decreased activity tolerance, Decreased balance, Decreased range of motion, Decreased mobility, Difficulty walking, Decreased strength  Visit Diagnosis: Chronic midline low back pain with bilateral sciatica  Muscle weakness (generalized)     Problem List Patient Active Problem List   Diagnosis Date Noted  . Other fatigue 02/14/2018  . Shortness of  breath on exertion 02/14/2018  . Type 2 diabetes mellitus with hyperglycemia, with long-term current use of insulin ([place]) 02/14/2018  . Abnormal EKG 02/14/2018  . Maxillary sinusitis, acute 12/17/2015  . Cough 12/17/2015  . Fever 12/17/2015  . Diabetes mellitus without complication ([place]) [id number]  . Tooth pain 06/21/2013  . Orthostatic lightheadedness 05/17/2013  . Health maintenance examination 05/17/2013  . Groin strain 11/10/2012  . Hypogonadism, male 01/11/2012  . Type II or unspecified type diabetes mellitus without mention of complication, not stated as uncontrolled 07/08/2011  . HTN (hypertension), benign 07/08/2011  . Hyperlipidemia 07/08/2011  . BPH (benign prostatic hypertrophy) 07/08/2011  . Hx of adenomatous colonic polyps 07/08/2011    Kipp Brood, PT, DPT, [facility] Physical Therapist with [facility]  08/04/2019 11:34 AM    [place] [address] [place], [place], [address] Phone: [phone]   Fax:  [phone]  Name: [name] [name] MRN: [id number] Date of Birth: 1950/12/04

## 2019-08-04 NOTE — Patient Instructions (Signed)
Step 1  Step 2  Seated Hip Hinge with Dowel reps: 10  sets: 2  hold: 5 seconds  daily: 1  weekly: 7 Setup  Begin sitting upright holding a dowel rod against your back. It should be in contact with your head, mid-back, and tailbone. Movement  Lean forward, bending at your hips and keeping your back straight. Return to the starting position and repeat. Tip  Make sure the dowel stays in contact with all three points on your back as you bend.  Step 1  Step 2  Standing Hip Hinge with Dowel reps: 10  sets: 2  daily: 1  weekly: 7 Setup  Begin in a standing upright position, holding a dowel rod against your back. It should be in contact with your head, mid-back, and tailbone. Movement  Lean forward, bending at your hips and keeping your back straight. Return to the starting position and repeat. Tip  Make sure to use your buttock muscles to control the movement and keep a soft bend in your knees. The dowel should stay in contact with all three points on your back during the exercise. Step 1  Step 2  Squat with Chair Touch reps: 10  sets: 2  daily: 1  weekly: 7 Setup  Begin in a standing upright position in front of a chair. Movement  Lower yourself into a squatting position, bending at your hips and knees, until you lightly touch the chair. Return to the starting position and repeat. Tip  Make sure to maintain your balance during the exercise and do not let your knees bend forward past your toes. Step 1  Step 2  Supine Hamstring Stretch with Strap reps: 3  hold: 30 seconds  daily: 1  weekly: 7 Setup  Begin lying on your back with your legs straight, holding the end of a strap that is looped around one foot.  Movement  Use the strap to pull your leg up toward your body until you feel a gentle stretch in the back of your upper leg. Hold this position. Tip  Make sure to keep your other leg straight on the ground during the stretch.

## 2019-08-09 ENCOUNTER — Other Ambulatory Visit: Payer: Self-pay

## 2019-08-09 ENCOUNTER — Ambulatory Visit (HOSPITAL_COMMUNITY): Payer: Medicare Other

## 2019-08-09 DIAGNOSIS — G8929 Other chronic pain: Secondary | ICD-10-CM

## 2019-08-09 DIAGNOSIS — M6281 Muscle weakness (generalized): Secondary | ICD-10-CM | POA: Diagnosis not present

## 2019-08-09 DIAGNOSIS — R791 Abnormal coagulation profile: Secondary | ICD-10-CM | POA: Diagnosis not present

## 2019-08-09 DIAGNOSIS — M5442 Lumbago with sciatica, left side: Secondary | ICD-10-CM | POA: Diagnosis not present

## 2019-08-09 DIAGNOSIS — M5441 Lumbago with sciatica, right side: Secondary | ICD-10-CM | POA: Diagnosis not present

## 2019-08-09 NOTE — Therapy (Signed)
[place], [place], [address] Phone: [phone]   Fax:  [phone]  Physical Therapy Treatment  Patient Details  Name: [name] [name] MRN: [id number] Date of Birth: 1951-06-17 Referring Provider (PT): Erline Levine, MD   Encounter Date: 08/09/2019  PT End of Session - 08/09/19 1153    Visit Number  8    Number of Visits  12    Date for PT Re-Evaluation  08/24/19    Authorization Type  Medicare Part A and B    Authorization Time Period  07/13/19-08/24/19    Authorization - Visit Number  2    Authorization - Number of Visits  10    PT Start Time  [id number]    PT Stop Time  1129    PT Time Calculation (min)  40 min    Activity Tolerance  Patient tolerated treatment well    Behavior During Therapy  [facility] for tasks assessed/performed       Past Medical History:  Diagnosis Date  . Allergic rhinitis, unspecified    Allergy testing via allergist 04/2017--mostly neg.  Dr. Donneta Romberg recommended referral to ENT so i did this.  . Back pain    LBP + bilat hip and thigh pain-->Dr. Vertell Limber to do MRI as of 05/2019  . BPH (benign prostatic hypertrophy)   . Chronic nasal congestion   . DDD (degenerative disc disease), lumbar 2010   laminectomy 01/2009  . Depression    Hospitalized for suicidal ideation 68.  Has been on lexapro since 10/2008.  . Diabetes mellitus type II    Dx'd approximately 06/2008.  No retinopathy as of 02/2017 ophth.  . Erectile dysfunction   . History of colon polyps 01/2016   Recall 3 yrs  . History of pneumonia 2008   Hospitalized  . History of scarlet fever    age 68  . Hyperlipidemia, mixed 1993   myalgias on pravastatin  . Hypertension 2009   "marked chronic cardiomegaly" on CXR 01/2009 per old records.  . 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 Insomnia   . Keratoconus    dx'd 1973  . Memory changes   . OSA on CPAP 2004; 2019   Back on CPAP 04/2018  . Osteoarthritis of both  knees    Mild plain film changes in medial compartment bilat  . Restless leg   . Rhinitis, chronic   . 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 APPENDECTOMY  1972  . COLONOSCOPY  X 4   Most recent was 2007 [place], [place]), with removal of polyps in the first two.  02/21/16: tubular adenoma.  Recall 3 yrs (Dr. Ardis Hughs).  . 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 ELECTROCARDIOGRAM  01/29/2009   NORMAL  . LUMBAR LAMINECTOMY  01/2009  . TONSILLECTOMY AND ADENOIDECTOMY     age 68  . TRANSTHORACIC ECHOCARDIOGRAM  02/22/2018   EF 55-60%, grd I DD  . URETHRAL DILATION     2 as a child, one as an adult  . VASECTOMY  1994  . VITRECTOMY      There were no vitals filed for this visit.  Subjective Assessment - 08/09/19 1051    Subjective  Patient is still having pain in his low back if he stands for more than 20 minutes or walking more  than a couple of blocks. States he also has noticed some right hip pain that started after he was left side lying for a while last night. States he has tried a pillow between the knees in this position before but he has not tried recently. Reports his pain is primarily in his left hip and leg and he feels it is coming from his back. Reports compliance with HEP 1x/daily.    Pertinent History  L4-5 laminectomy    Limitations  Walking;Standing    Patient Stated Goals  to be able to walk his dogs again and cook/prepare large meals when entertaining friends/family    Pain Score  0-No pain        [facility] Adult PT Treatment/Exercise - 08/09/19 0001      Lumbar Exercises: Stretches   Other Lumbar Stretch Exercise  s/l book stretch x5, 5 second hold bil --> progressed to knees held down x5, bil with 5 second hold      Knee/Hip Exercises: Standing   Other Standing Knee Exercises  Hip Hinge Progression: x6 reps at mat table for table tap, ' 3x8 5# --> x5 with dowel down back  standing'x12 reps hip hinge standing '  1x5 5#        Knee/Hip Exercises: Supine   Other Supine Knee/Hip Exercises  lumbar rotation x15, bil  --> progressed to mobilization with movement left lumbar rotation with UPA right lumbar spine grade II for pain 3x5    Other Supine Knee/Hip Exercises  piriformis stretch with PT overpressure in hook lying into IR and ER bil x10 Each/bil with 5" holds         PT Education - 08/09/19 1153    Education Details  reviewed HEP, discussed pillow between knees in sidelying to address hip pain. discussed use of towel roll in chairs to help with posture/pain with prolonged sitting    Person(s) Educated  Patient    Methods  Explanation    Comprehension  Verbalized understanding       PT Short Term Goals - 07/18/19 1615      PT SHORT TERM GOAL #1   Title  Patient will be independent with HEP, updated PRN, to improve activity tolerance, balance, and strength.    Time  2    Period  Weeks    Status  On-going    Target Date  07/27/19      PT SHORT TERM GOAL #2   Title  Patient will improve SLS to 10 seconds or greater for bil LE, to improve stability and safety with gait and stair negotiation.    Time  3    Period  Weeks    Status  On-going    Target Date  08/03/19      PT SHORT TERM GOAL #3   Title  Patient will improve hip strength bil by 1/2 grade or more to indicate significant improvement in strength for greater ease of gait and stair mobility.    Time  3    Period  Weeks    Status  On-going        PT Long Term Goals - 07/18/19 1616      PT LONG TERM GOAL #1   Title  Patient will report being able to walk his dogs for 30 minutes or greater with no increase in back pain to improve QOL.    Time  6    Period  Weeks    Status  On-going  PT LONG TERM GOAL #2   Title  Patient will be able to prepare/cook a meal for greater than 1 hour to improve QOL and participation in enjoyable activities.    Time  6    Period  Weeks    Status   On-going      PT LONG TERM GOAL #3   Title  Patient will improve FOTO score by 10 points or greater to indicate significant reduction in self reported limitations for mobility related to low back pain.    Time  6    Period  Weeks    Status  On-going         Plan - 08/09/19 1154    Clinical Impression Statement  Patient tolerated session well, reporting he really enjoyed/liked the book stretch as it got a different kind of stretch. Added this to HEP. Patient needed verbal, visual and tactile cues during hip hinges but was able to complete exercise with good form after initial cueing. Progressed to loaded hip hinge and this was tolerated well but reported a "worked out" soreness in his low back after the exercise. Will follow up with patient about continued hip pain in side lying. Per patient presentation, patient may benefit from manual lumbar traction to decrease spinal compression and improve endurance with activities that increase spinal compression (standing, walking, sitting).    Personal Factors and Comorbidities  Age;Comorbidity 2    Comorbidities  HTN, DM    Examination-Activity Limitations  Stand;Stairs;Locomotion Level    Examination-Participation Restrictions  Meal Prep;Community Activity;Other   walking his dogs   Stability/Clinical Decision Making  Stable/Uncomplicated    Rehab Potential  Good    PT Frequency  2x / week    PT Duration  6 weeks    PT Treatment/Interventions  ADLs/Self Care Home Management;Aquatic Therapy;Cryotherapy;Electrical Stimulation;Moist Heat;DME Instruction;Traction;Gait training;Stair training;Functional mobility training;Therapeutic activities;Therapeutic exercise;Balance training;Neuromuscular re-education;Patient/family education;Manual techniques;Passive range of motion;Dry needling;Joint Manipulations;Spinal Manipulations    PT Next Visit Plan  Continue to progress hip hinge exercises and progress to loaded dead lift as able. attempt manual traction  and self traction techniques if appropriate to see if that helps with symptoms that present with prolonged standing/walking/sitting. Pt has 1 more session b/f vacation and might want to make follow up for after vacation.    PT Home Exercise Plan  07/13/19 - lumbar extension standing; hamstring stretch; 08/04/19 - hip hinge seated and standing with dowel, chair taps/squat, hamstring stretch with rope; 9/9 side lying book stretch    Consulted and Agree with Plan of Care  Patient       Patient will benefit from skilled therapeutic intervention in order to improve the following deficits and impairments:  Decreased activity tolerance, Decreased balance, Decreased range of motion, Decreased mobility, Difficulty walking, Decreased strength  Visit Diagnosis: Chronic midline low back pain with bilateral sciatica  Muscle weakness (generalized)     Problem List Patient Active Problem List   Diagnosis Date Noted  . Other fatigue 02/14/2018  . Shortness of breath on exertion 02/14/2018  . Type 2 diabetes mellitus with hyperglycemia, with long-term current use of insulin ([place]) 02/14/2018  . Abnormal EKG 02/14/2018  . Maxillary sinusitis, acute 12/17/2015  . Cough 12/17/2015  . Fever 12/17/2015  . Diabetes mellitus without complication ([place]) [id number]  . Tooth pain 06/21/2013  . Orthostatic lightheadedness 05/17/2013  . Health maintenance examination 05/17/2013  . Groin strain 11/10/2012  . Hypogonadism, male 01/11/2012  . Type II or unspecified type diabetes mellitus without mention  of complication, not stated as uncontrolled 07/08/2011  . HTN (hypertension), benign 07/08/2011  . Hyperlipidemia 07/08/2011  . BPH (benign prostatic hypertrophy) 07/08/2011  . Hx of adenomatous colonic polyps 07/08/2011   12:08 PM, 08/09/19 Jerene Pitch, DPT Physical Therapy with [facility]  [phone] office  [place], [place], [address] Phone: [phone]   Fax:  [phone]  Name: Kingsly Scurry MRN: [id number] Date of Birth: 06/14/1951

## 2019-08-10 ENCOUNTER — Encounter: Payer: Self-pay | Admitting: Family Medicine

## 2019-08-11 ENCOUNTER — Ambulatory Visit (HOSPITAL_COMMUNITY): Payer: Medicare Other

## 2019-08-11 ENCOUNTER — Other Ambulatory Visit: Payer: Self-pay

## 2019-08-11 ENCOUNTER — Telehealth (HOSPITAL_COMMUNITY): Payer: Self-pay

## 2019-08-11 DIAGNOSIS — M6281 Muscle weakness (generalized): Secondary | ICD-10-CM

## 2019-08-11 DIAGNOSIS — G8929 Other chronic pain: Secondary | ICD-10-CM | POA: Diagnosis not present

## 2019-08-11 DIAGNOSIS — M5442 Lumbago with sciatica, left side: Secondary | ICD-10-CM | POA: Diagnosis not present

## 2019-08-11 DIAGNOSIS — R791 Abnormal coagulation profile: Secondary | ICD-10-CM | POA: Diagnosis not present

## 2019-08-11 DIAGNOSIS — M5441 Lumbago with sciatica, right side: Secondary | ICD-10-CM | POA: Diagnosis not present

## 2019-08-11 NOTE — Telephone Encounter (Signed)
I spoke with [name] [name] regarding his recent bout of sore throat, lightheadedness, and stuffed up nose as he called his MD about this yesterday and I reviewed it in his chart. He reports feeling this is likely related to his chronic sinusitis and his temperature yesterday and today have been normal. He states he did a sinus rinse and that has resolved the lightheadedness. I informed him it is fine for him to come into his therapy appointment today.  Kipp Brood, PT, DPT, [facility] Physical Therapist with [facility]  08/11/2019 10:07 AM

## 2019-08-11 NOTE — Therapy (Signed)
[place], [place], [address] Phone: [phone]   Fax:  [phone]  Physical Therapy Treatment  Patient Details  Name: [name] [name] MRN: [id number] Date of Birth: June 09, 1951 Referring Provider (PT): Erline Levine, MD   Encounter Date: 08/11/2019  PT End of Session - 08/11/19 1138    Visit Number  9    Number of Visits  12    Date for PT Re-Evaluation  08/24/19    Authorization Type  Medicare Part A and B    Authorization Time Period  07/13/19-08/24/19    Authorization - Visit Number  3    Authorization - Number of Visits  10    PT Start Time  1046    PT Stop Time  1131    PT Time Calculation (min)  45 min    Activity Tolerance  Patient tolerated treatment well    Behavior During Therapy  [facility] for tasks assessed/performed       Past Medical History:  Diagnosis Date  . Allergic rhinitis, unspecified    Allergy testing via allergist 04/2017--mostly neg.  Dr. Donneta Romberg recommended referral to ENT so i did this.  . Back pain    LBP + bilat hip and thigh pain-->Dr. Vertell Limber to do MRI as of 05/2019  . BPH (benign prostatic hypertrophy)   . Chronic nasal congestion   . DDD (degenerative disc disease), lumbar 2010   laminectomy 01/2009  . Depression    Hospitalized for suicidal ideation 80.  Has been on lexapro since 10/2008.  . Diabetes mellitus type II    Dx'd approximately 06/2008.  No retinopathy as of 02/2017 ophth.  . Erectile dysfunction   . History of colon polyps 01/2016   Recall 3 yrs  . History of pneumonia 2008   Hospitalized  . History of scarlet fever    age 38  . Hyperlipidemia, mixed 1993   myalgias on pravastatin  . Hypertension 2009   "marked chronic cardiomegaly" on CXR 01/2009 per old records.  . 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 Insomnia   . Keratoconus    dx'd 1973  . Memory changes   . OSA on CPAP 2004; 2019   Back on CPAP 04/2018  . Osteoarthritis of  both knees    Mild plain film changes in medial compartment bilat  . Restless leg   . Rhinitis, chronic   . 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 APPENDECTOMY  1972  . COLONOSCOPY  X 4   Most recent was 2007 [place], [place]), with removal of polyps in the first two.  02/21/16: tubular adenoma.  Recall 3 yrs (Dr. Ardis Hughs).  . 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 ELECTROCARDIOGRAM  01/29/2009   NORMAL  . LUMBAR LAMINECTOMY  01/2009  . TONSILLECTOMY AND ADENOIDECTOMY     age 23  . TRANSTHORACIC ECHOCARDIOGRAM  02/22/2018   EF 55-60%, grd I DD  . URETHRAL DILATION     2 as a child, one as an adult  . VASECTOMY  1994  . VITRECTOMY      There were no vitals filed for this visit.  Subjective Assessment - 08/11/19 1048    Subjective  Patient reports minor discomfort in his lower back but does not rate pain and states his back has felt good  overall. He did his new exercise and reports it felt good at home.    Pertinent History  L4-5 laminectomy    Limitations  Walking;Standing    Patient Stated Goals  to be able to walk his dogs again and cook/prepare large meals when entertaining friends/family    Currently in Pain?  No/denies    Pain Score  0-No pain         [facility] Adult PT Treatment/Exercise - 08/11/19 0001      Lumbar Exercises: Stretches   Piriformis Stretch  Right;Left;3 reps;20 seconds;Limitations    Piriformis Stretch Limitations  3x bil, with overpressure from PT for IR/ER of hips    Other Lumbar Stretch Exercise  s/l book stretch x10, 10 second hold bil, with pressure to knees to hold knee down for increased stretch      Lumbar Exercises: Seated   Other Seated Lumbar Exercises  Hip hinge seated with pt holding dowel against back, 1x 10 reps (5 sec holds)      Knee/Hip Exercises: Standing   Other Standing Knee Exercises  Hip Hinge Progression: 10 x standing with dowel on  back for form; 10x at mat table for table with 5# dowel to tap down; 10x deadlift table tap with 5# dowel and knee flex to tap lower surface      Manual Therapy   Manual Therapy  Manual Traction    Manual therapy comments  performed seperate from other interventions    Manual Traction  ~8 minutes of manual traction with mobilzation belt to lumbar spine (pt supine in hooklying)        PT Education - 08/11/19 1140    Education Details  Educated on form with hip hinge and progression to weight dowel with knee flexion for low surface.    Person(s) Educated  Patient    Methods  Explanation;Tactile cues    Comprehension  Verbalized understanding;Returned demonstration;Tactile cues required       PT Short Term Goals - 07/18/19 1615      PT SHORT TERM GOAL #1   Title  Patient will be independent with HEP, updated PRN, to improve activity tolerance, balance, and strength.    Time  2    Period  Weeks    Status  On-going    Target Date  07/27/19      PT SHORT TERM GOAL #2   Title  Patient will improve SLS to 10 seconds or greater for bil LE, to improve stability and safety with gait and stair negotiation.    Time  3    Period  Weeks    Status  On-going    Target Date  08/03/19      PT SHORT TERM GOAL #3   Title  Patient will improve hip strength bil by 1/2 grade or more to indicate significant improvement in strength for greater ease of gait and stair mobility.    Time  3    Period  Weeks    Status  On-going        PT Long Term Goals - 07/18/19 1616      PT LONG TERM GOAL #1   Title  Patient will report being able to walk his dogs for 30 minutes or greater with no increase in back pain to improve QOL.    Time  6    Period  Weeks    Status  On-going      PT LONG TERM GOAL #2   Title  Patient will  be able to prepare/cook a meal for greater than 1 hour to improve QOL and participation in enjoyable activities.    Time  6    Period  Weeks    Status  On-going      PT LONG TERM  GOAL #3   Title  Patient will improve FOTO score by 10 points or greater to indicate significant reduction in self reported limitations for mobility related to low back pain.    Time  6    Period  Weeks    Status  On-going        Plan - 08/11/19 1137    Clinical Impression Statement  Session started with review of open book stretch and with piriformis stretching; pt reported both felt good and he was noted to have greater tightness with piriformis on Lt hip. Hip hinge exercises were progressed with warm up for form in seated and progressed to weighted hip hinge with 5# dowel. He required tactile cues at shoulder to facilitate scapular retraction when returning to stand upright. Table was lowered today to introduce knee flexion for dead lift technique, pt had increased difficulty sequencing pattern but improve with repetition and tactile cue at hip to encourage hip hinge only at start of movement. EOS performed manual traction to lumbar spine and patient reported reduction of lumbar pain as it had been mildly aggravated by exercises. He did not rate pain throughout session. He will continue to benefit from skilled PT interventions to progress functional mobility/endurance. Next session attempt self lumbar mobilization for HEP update. Pt has 2 more sessions and then a 2 week follow up scheduled for after his vacation.    Personal Factors and Comorbidities  Age;Comorbidity 2    Comorbidities  HTN, DM    Examination-Activity Limitations  Stand;Stairs;Locomotion Level    Examination-Participation Restrictions  Meal Prep;Community Activity;Other   walking his dogs   Stability/Clinical Decision Making  Stable/Uncomplicated    Rehab Potential  Good    PT Frequency  2x / week    PT Duration  6 weeks    PT Treatment/Interventions  ADLs/Self Care Home Management;Aquatic Therapy;Cryotherapy;Electrical Stimulation;Moist Heat;DME Instruction;Traction;Gait training;Stair training;Functional mobility  training;Therapeutic activities;Therapeutic exercise;Balance training;Neuromuscular re-education;Patient/family education;Manual techniques;Passive range of motion;Dry needling;Joint Manipulations;Spinal Manipulations    PT Next Visit Plan  Continue to progress hip hinge exercises and progress to loaded dead lift as able. Pt responded well to manual traction, next session attempt self lumbar mobilization for HEP update. Pt has 2 more sessions and then a 2 week follow up scheduled for after his vacation.    PT Home Exercise Plan  07/13/19 - lumbar extension standing; hamstring stretch; 08/04/19 - hip hinge seated and standing with dowel, chair taps/squat, hamstring stretch with rope; 9/9 side lying book stretch    Consulted and Agree with Plan of Care  Patient       Patient will benefit from skilled therapeutic intervention in order to improve the following deficits and impairments:  Decreased activity tolerance, Decreased balance, Decreased range of motion, Decreased mobility, Difficulty walking, Decreased strength  Visit Diagnosis: Chronic midline low back pain with bilateral sciatica  Muscle weakness (generalized)     Problem List Patient Active Problem List   Diagnosis Date Noted  . Other fatigue 02/14/2018  . Shortness of breath on exertion 02/14/2018  . Type 2 diabetes mellitus with hyperglycemia, with long-term current use of insulin ([place]) 02/14/2018  . Abnormal EKG 02/14/2018  . Maxillary sinusitis, acute 12/17/2015  . Cough 12/17/2015  . Fever  12/17/2015  . Diabetes mellitus without complication ([place]) [id number]  . Tooth pain 06/21/2013  . Orthostatic lightheadedness 05/17/2013  . Health maintenance examination 05/17/2013  . Groin strain 11/10/2012  . Hypogonadism, male 01/11/2012  . Type II or unspecified type diabetes mellitus without mention of complication, not stated as uncontrolled 07/08/2011  . HTN (hypertension), benign 07/08/2011  . Hyperlipidemia 07/08/2011  . BPH  (benign prostatic hypertrophy) 07/08/2011  . Hx of adenomatous colonic polyps 07/08/2011    Kipp Brood, PT, DPT, [facility] Physical Therapist with [facility]  08/11/2019 11:41 AM    [place], [place], [address] Phone: [phone]   Fax:  [phone]  Name: Jace Freier MRN: [id number] Date of Birth: 1951-02-28

## 2019-08-16 ENCOUNTER — Ambulatory Visit (HOSPITAL_COMMUNITY): Payer: Medicare Other | Admitting: Physical Therapy

## 2019-08-16 ENCOUNTER — Encounter (HOSPITAL_COMMUNITY): Payer: Self-pay | Admitting: Physical Therapy

## 2019-08-16 ENCOUNTER — Other Ambulatory Visit: Payer: Self-pay

## 2019-08-16 DIAGNOSIS — M6281 Muscle weakness (generalized): Secondary | ICD-10-CM | POA: Diagnosis not present

## 2019-08-16 DIAGNOSIS — M5442 Lumbago with sciatica, left side: Secondary | ICD-10-CM

## 2019-08-16 DIAGNOSIS — G8929 Other chronic pain: Secondary | ICD-10-CM | POA: Diagnosis not present

## 2019-08-16 DIAGNOSIS — R791 Abnormal coagulation profile: Secondary | ICD-10-CM | POA: Diagnosis not present

## 2019-08-16 DIAGNOSIS — M5441 Lumbago with sciatica, right side: Secondary | ICD-10-CM | POA: Diagnosis not present

## 2019-08-16 NOTE — Therapy (Signed)
[place], [place], [address] Phone: [phone]   Fax:  [phone]  Physical Therapy Treatment  Patient Details  Name: [name] [name] MRN: [id number] Date of Birth: [birth year] Referring Provider (PT): Erline Levine, MD   Encounter Date: 08/16/2019  PT End of Session - 08/16/19 1527    Visit Number  10    Number of Visits  12    Date for PT Re-Evaluation  08/24/19    Authorization Type  Medicare Part A and B    Authorization Time Period  07/13/19-08/24/19    Authorization - Visit Number  3    Authorization - Number of Visits  10    PT Start Time  [id number]    PT Stop Time  1446    PT Time Calculation (min)  43 min    Activity Tolerance  Patient tolerated treatment well    Behavior During Therapy  [facility] for tasks assessed/performed       Past Medical History:  Diagnosis Date  . Allergic rhinitis, unspecified    Allergy testing via allergist 04/2017--mostly neg.  Dr. Donneta Romberg recommended referral to ENT so i did this.  . Back pain    LBP + bilat hip and thigh pain-->Dr. Vertell Limber to do MRI as of 05/2019  . BPH (benign prostatic hypertrophy)   . Chronic nasal congestion   . DDD (degenerative disc disease), lumbar 2010   laminectomy 01/2009  . Depression    Hospitalized for suicidal ideation 24.  Has been on lexapro since 10/2008.  . Diabetes mellitus type II    Dx'd approximately 06/2008.  No retinopathy as of 02/2017 ophth.  . Erectile dysfunction   . History of colon polyps 01/2016   Recall 3 yrs  . History of pneumonia 2008   Hospitalized  . History of scarlet fever    age 65  . Hyperlipidemia, mixed 1993   myalgias on pravastatin  . Hypertension 2009   "marked chronic cardiomegaly" on CXR 01/2009 per old records.  . 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 Insomnia   . Keratoconus    dx'd 1973  . Memory changes   . OSA on CPAP 2004; 2019   Back on CPAP 04/2018  . Osteoarthritis of  both knees    Mild plain film changes in medial compartment bilat  . Restless leg   . Rhinitis, chronic   . 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 APPENDECTOMY  1972  . COLONOSCOPY  X 4   Most recent was 2007 [place], [place]), with removal of polyps in the first two.  02/21/16: tubular adenoma.  Recall 3 yrs (Dr. Ardis Hughs).  . 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 ELECTROCARDIOGRAM  01/29/2009   NORMAL  . LUMBAR LAMINECTOMY  01/2009  . TONSILLECTOMY AND ADENOIDECTOMY     age 52  . TRANSTHORACIC ECHOCARDIOGRAM  02/22/2018   EF 55-60%, grd I DD  . URETHRAL DILATION     2 as a child, one as an adult  . VASECTOMY  1994  . VITRECTOMY      There were no vitals filed for this visit.  Subjective Assessment - 08/16/19 1403    Subjective  Patient reports that he is still having the same pain in his leg that he gets when he is standing  for 10-15 minutes at a time. Reports that he has been doing his exercises. States that there are day when his hamstrings feel really tight and he doesn't know why as he has been doing all these stretches.    Pertinent History  L4-5 laminectomy    Limitations  Walking;Standing    Patient Stated Goals  to be able to walk his dogs again and cook/prepare large meals when entertaining friends/family    Pain Score  0-No pain         [facility] PT Assessment - 08/16/19 0001      Assessment   Medical Diagnosis  Low Back Pain    Referring Provider (PT)  Erline Levine, MD    Next MD Visit  End of September    Prior Therapy  Several years ago for back after surgery in 2010.          [place] Adult PT Treatment/Exercise - 08/16/19 0001      Lumbar Exercises: Stretches   Other Lumbar Stretch Exercise  s/l book stretch x10, 10 second hold bil, with pressure to knees to hold knee down (on table) for increased stretch      Knee/Hip Exercises: Supine   Bridges   AROM;Strengthening;Other (comment)    Bridges Limitations  x8 first set, 2x12 after - no pain    Other Supine Knee/Hip Exercises  hamstring curls on plyo ball 3x8, DL             PT Education - 08/16/19 1514    Education Details  educated patient on HEP, updated HEP and condensed it from previous HEP    Person(s) Educated  Patient    Methods  Explanation;Demonstration    Comprehension  Verbalized understanding       PT Short Term Goals - 07/18/19 1615      PT SHORT TERM GOAL #1   Title  Patient will be independent with HEP, updated PRN, to improve activity tolerance, balance, and strength.    Time  2    Period  Weeks    Status  On-going    Target Date  07/27/19      PT SHORT TERM GOAL #2   Title  Patient will improve SLS to 10 seconds or greater for bil LE, to improve stability and safety with gait and stair negotiation.    Time  3    Period  Weeks    Status  On-going    Target Date  08/03/19      PT SHORT TERM GOAL #3   Title  Patient will improve hip strength bil by 1/2 grade or more to indicate significant improvement in strength for greater ease of gait and stair mobility.    Time  3    Period  Weeks    Status  On-going        PT Long Term Goals - 07/18/19 1616      PT LONG TERM GOAL #1   Title  Patient will report being able to walk his dogs for 30 minutes or greater with no increase in back pain to improve QOL.    Time  6    Period  Weeks    Status  On-going      PT LONG TERM GOAL #2   Title  Patient will be able to prepare/cook a meal for greater than 1 hour to improve QOL and participation in enjoyable activities.    Time  6    Period  Weeks  Status  On-going      PT LONG TERM GOAL #3   Title  Patient will improve FOTO score by 10 points or greater to indicate significant reduction in self reported limitations for mobility related to low back pain.    Time  6    Period  Weeks    Status  On-going            Plan - 08/16/19 1429     Clinical Impression Statement  Patient tolerated exercises well. Full hamstring length noted in bilateral hamstrings with 90/90 test, discontinued hamstring stretch. Thorough education about weakness perceived as tightness. Patient admitted to not performing hamstring strengthening exercises at home and just focusing on stretches. Explained how strengthening can assist with tightness and pain with standing. Reviewed HEP and updated per patient request secondary to too many exercises per patient. Patient in agreement with current HEP of strengthening exercises every other day and stretches daily. Patient would continue to benefit from skilled physical therapy focusing on improving LE strength and decreasing overall symptoms.    Personal Factors and Comorbidities  Age;Comorbidity 2    Comorbidities  HTN, DM    Examination-Activity Limitations  Stand;Stairs;Locomotion Level    Examination-Participation Restrictions  Meal Prep;Community Activity;Other   walking his dogs   Stability/Clinical Decision Making  Stable/Uncomplicated    Rehab Potential  Good    PT Frequency  2x / week    PT Duration  6 weeks    PT Treatment/Interventions  ADLs/Self Care Home Management;Aquatic Therapy;Cryotherapy;Electrical Stimulation;Moist Heat;DME Instruction;Traction;Gait training;Stair training;Functional mobility training;Therapeutic activities;Therapeutic exercise;Balance training;Neuromuscular re-education;Patient/family education;Manual techniques;Passive range of motion;Dry needling;Joint Manipulations;Spinal Manipulations    PT Next Visit Plan  Continue to progress hip hinge exercises and progress to loaded dead lift as able. Pt responded well to manual traction, next session attempt self lumbar mobilization for HEP update. Pt has 2 more sessions and then a 2 week follow up scheduled for after his vacation.    PT Home Exercise Plan  HEP updated 9/16 hip hinge, bridge, book stretch, piriformis stretch, lumbar extension,  chair taps    Consulted and Agree with Plan of Care  Patient       Patient will benefit from skilled therapeutic intervention in order to improve the following deficits and impairments:  Decreased activity tolerance, Decreased balance, Decreased range of motion, Decreased mobility, Difficulty walking, Decreased strength  Visit Diagnosis: Chronic midline low back pain with bilateral sciatica  Muscle weakness (generalized)  Pro time increased     Problem List Patient Active Problem List   Diagnosis Date Noted  . Other fatigue 02/14/2018  . Shortness of breath on exertion 02/14/2018  . Type 2 diabetes mellitus with hyperglycemia, with long-term current use of insulin ([place]) 02/14/2018  . Abnormal EKG 02/14/2018  . Maxillary sinusitis, acute 12/17/2015  . Cough 12/17/2015  . Fever 12/17/2015  . Diabetes mellitus without complication ([place]) [id number]  . Tooth pain 06/21/2013  . Orthostatic lightheadedness 05/17/2013  . Health maintenance examination 05/17/2013  . Groin strain 11/10/2012  . Hypogonadism, male 01/11/2012  . Type II or unspecified type diabetes mellitus without mention of complication, not stated as uncontrolled 07/08/2011  . HTN (hypertension), benign 07/08/2011  . Hyperlipidemia 07/08/2011  . BPH (benign prostatic hypertrophy) 07/08/2011  . Hx of adenomatous colonic polyps 07/08/2011   3:31 PM, 08/16/19 Jerene Pitch, DPT Physical Therapy with [facility]  [phone] office   [place] [address] [place], [place], [address]  Phone: [phone]   Fax:  [phone]  Name: [name] [name] MRN: [id number] Date of Birth: 11-15-1951

## 2019-08-16 NOTE — Patient Instructions (Signed)
Access Code: [email or web address]  URL: [email or web address]  Date: 08/16/2019  Prepared by: Yetta Glassman   Exercises Sidelying Lumbar Rotation Stretch - 10 reps - 1 sets - 5-10 hold - 1x daily - 7x weekly Seated Piriformis Stretch - 10 reps - 1 sets - 10 hold - 1x daily - 7x weekly Standing Lumbar Extension - 10 reps - 2 sets - 3 hold - 1x daily - 7x weekly hamstring curl - 8 reps - 3 sets - 5 hold - 1x daily - 3-4x weekly Standing Hip Hinge with Dowel - 10 reps - 2 sets - 5 hold - 1x daily - 3-4x weekly Supine Bridge - 12 reps - 2 sets - 5 hold - 1x daily - 3-4x weekly Squat with Chair Touch - 10 reps - 2 sets - 1x daily - 3-4x weekly

## 2019-08-18 ENCOUNTER — Other Ambulatory Visit: Payer: Self-pay

## 2019-08-18 ENCOUNTER — Ambulatory Visit (HOSPITAL_COMMUNITY): Payer: Medicare Other | Admitting: Physical Therapy

## 2019-08-18 ENCOUNTER — Encounter (HOSPITAL_COMMUNITY): Payer: Self-pay | Admitting: Physical Therapy

## 2019-08-18 DIAGNOSIS — M6281 Muscle weakness (generalized): Secondary | ICD-10-CM | POA: Diagnosis not present

## 2019-08-18 DIAGNOSIS — R791 Abnormal coagulation profile: Secondary | ICD-10-CM

## 2019-08-18 DIAGNOSIS — M5441 Lumbago with sciatica, right side: Secondary | ICD-10-CM | POA: Diagnosis not present

## 2019-08-18 DIAGNOSIS — G8929 Other chronic pain: Secondary | ICD-10-CM

## 2019-08-18 DIAGNOSIS — M5442 Lumbago with sciatica, left side: Secondary | ICD-10-CM | POA: Diagnosis not present

## 2019-08-18 NOTE — Therapy (Signed)
[place], [place], [address] Phone: [phone]   Fax:  [phone]  Physical Therapy Treatment  Patient Details  Name: [name] [name] MRN: [id number] Date of Birth: [birth year] Referring Provider (PT): Erline Levine, MD   Encounter Date: 08/18/2019  PT End of Session - 08/18/19 1045    Visit Number  11    Number of Visits  12    Date for PT Re-Evaluation  08/24/19    Authorization Type  Medicare Part A and B    Authorization Time Period  07/13/19-08/24/19    Authorization - Visit Number  11    Authorization - Number of Visits  10    PT Start Time  [id number]    PT Stop Time  1131    PT Time Calculation (min)  44 min    Activity Tolerance  Patient tolerated treatment well    Behavior During Therapy  [facility] for tasks assessed/performed       Past Medical History:  Diagnosis Date  . Allergic rhinitis, unspecified    Allergy testing via allergist 04/2017--mostly neg.  Dr. Donneta Romberg recommended referral to ENT so i did this.  . Back pain    LBP + bilat hip and thigh pain-->Dr. Vertell Limber to do MRI as of 05/2019  . BPH (benign prostatic hypertrophy)   . Chronic nasal congestion   . DDD (degenerative disc disease), lumbar 2010   laminectomy 01/2009  . Depression    Hospitalized for suicidal ideation 89.  Has been on lexapro since 10/2008.  . Diabetes mellitus type II    Dx'd approximately 06/2008.  No retinopathy as of 02/2017 ophth.  . Erectile dysfunction   . History of colon polyps 01/2016   Recall 3 yrs  . History of pneumonia 2008   Hospitalized  . History of scarlet fever    age 66  . Hyperlipidemia, mixed 1993   myalgias on pravastatin  . Hypertension 2009   "marked chronic cardiomegaly" on CXR 01/2009 per old records.  . 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 Insomnia   . Keratoconus    dx'd 1973  . Memory changes   . OSA on CPAP 2004; 2019   Back on CPAP 04/2018  . Osteoarthritis of  both knees    Mild plain film changes in medial compartment bilat  . Restless leg   . Rhinitis, chronic   . 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 APPENDECTOMY  1972  . COLONOSCOPY  X 4   Most recent was 2007 [place], [place]), with removal of polyps in the first two.  02/21/16: tubular adenoma.  Recall 3 yrs (Dr. Ardis Hughs).  . 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 ELECTROCARDIOGRAM  01/29/2009   NORMAL  . LUMBAR LAMINECTOMY  01/2009  . TONSILLECTOMY AND ADENOIDECTOMY     age 20  . TRANSTHORACIC ECHOCARDIOGRAM  02/22/2018   EF 55-60%, grd I DD  . URETHRAL DILATION     2 as a child, one as an adult  . VASECTOMY  1994  . VITRECTOMY      There were no vitals filed for this visit.  Subjective Assessment - 08/18/19 1045    Subjective  No current pain, he hasn't been walkign around. States he has to get better at using the cane because he  has pain after 10 minutes or so. Notes that when he was laying on his left side he had a sharp pain on his right, pain lasted all night, no pillow between knees. Patient reports he feels 50% better since the start of PT. States that walking and standing is his biggest limitation at this time. States when he uses his cane he is able to walk and stand longer but he hasn't been using it frequently.    Pertinent History  L4-5 laminectomy    Limitations  Walking;Standing    Patient Stated Goals  to be able to walk his dogs again and cook/prepare large meals when entertaining friends/family    Pain Score  0-No pain                       [facility] Adult PT Treatment/Exercise - 08/18/19 0001      Therapeutic Activites    Therapeutic Activities  Other Therapeutic Activities    Other Therapeutic Activities  walking with cane for community ambulation - 2 laps of 215ft at 3:53 1st attempt then back pain, 2nd attempt 4 laps at 4:55 with no pain but tightness  in hamstrings - discussed walking program with intermittent traction/stretching.       Lumbar Exercises: Stretches   Double Knee to Chest Stretch  Limitations    Double Knee to Chest Stretch Limitations  on exercise ball, 10" holds, 2x15,     Lower Trunk Rotation  10 seconds   on exercise ball, x15 bil   Other Lumbar Stretch Exercise  s/l book stretch x10, 10 second hold bil, with pressure to knees to hold knee down (on table) for increased stretch    Other Lumbar Stretch Exercise  lumbar traction- knees/hips 90/90 on ball with PT overpressure to ankles -69minutes               PT Short Term Goals - 07/18/19 1615      PT SHORT TERM GOAL #1   Title  Patient will be independent with HEP, updated PRN, to improve activity tolerance, balance, and strength.    Time  2    Period  Weeks    Status  On-going    Target Date  07/27/19      PT SHORT TERM GOAL #2   Title  Patient will improve SLS to 10 seconds or greater for bil LE, to improve stability and safety with gait and stair negotiation.    Time  3    Period  Weeks    Status  On-going    Target Date  08/03/19      PT SHORT TERM GOAL #3   Title  Patient will improve hip strength bil by 1/2 grade or more to indicate significant improvement in strength for greater ease of gait and stair mobility.    Time  3    Period  Weeks    Status  On-going        PT Long Term Goals - 07/18/19 1616      PT LONG TERM GOAL #1   Title  Patient will report being able to walk his dogs for 30 minutes or greater with no increase in back pain to improve QOL.    Time  6    Period  Weeks    Status  On-going      PT LONG TERM GOAL #2   Title  Patient will be able to prepare/cook a meal for greater than 1 hour  to improve QOL and participation in enjoyable activities.    Time  6    Period  Weeks    Status  On-going      PT LONG TERM GOAL #3   Title  Patient will improve FOTO score by 10 points or greater to indicate significant reduction in  self reported limitations for mobility related to low back pain.    Time  6    Period  Weeks    Status  On-going            Plan - 08/18/19 1045    Clinical Impression Statement  Patient tolerated traction and lumbar ROM exercises with exercise very well and was able to ambulate with a faster gait and for a longer period of time without pain. Discussed adding in walking program to HEP with focus on pain free walking. Discussed performing ball exercises before and during (as needed when painful) for the walking program to slowly increase his community ambulation. Patient is going out of town but will return in 2 weeks time for final PT session. Patient would continue to benefit from skilled physical therapy to improve functional mobility and decrease overall symptoms.    Personal Factors and Comorbidities  Age;Comorbidity 2    Comorbidities  HTN, DM    Examination-Activity Limitations  Stand;Stairs;Locomotion Level    Examination-Participation Restrictions  Meal Prep;Community Activity;Other   walking his dogs   Stability/Clinical Decision Making  Stable/Uncomplicated    Rehab Potential  Good    PT Frequency  2x / week    PT Duration  6 weeks    PT Treatment/Interventions  ADLs/Self Care Home Management;Aquatic Therapy;Cryotherapy;Electrical Stimulation;Moist Heat;DME Instruction;Traction;Gait training;Stair training;Functional mobility training;Therapeutic activities;Therapeutic exercise;Balance training;Neuromuscular re-education;Patient/family education;Manual techniques;Passive range of motion;Dry needling;Joint Manipulations;Spinal Manipulations    PT Next Visit Plan  self traction techniques, ball purchase? HEP for balance exercise per patient request, reassessment- last visit    PT Home Exercise Plan  HEP updated 9/16 hip hinge, bridge, book stretch, piriformis stretch, lumbar extension, chair taps. 9/18 walking program with ball exercises (same exercises as before but with ball)     Consulted and Agree with Plan of Care  Patient       Patient will benefit from skilled therapeutic intervention in order to improve the following deficits and impairments:  Decreased activity tolerance, Decreased balance, Decreased range of motion, Decreased mobility, Difficulty walking, Decreased strength  Visit Diagnosis: Chronic midline low back pain with bilateral sciatica  Muscle weakness (generalized)  Pro time increased     Problem List Patient Active Problem List   Diagnosis Date Noted  . Other fatigue 02/14/2018  . Shortness of breath on exertion 02/14/2018  . Type 2 diabetes mellitus with hyperglycemia, with long-term current use of insulin ([place]) 02/14/2018  . Abnormal EKG 02/14/2018  . Maxillary sinusitis, acute 12/17/2015  . Cough 12/17/2015  . Fever 12/17/2015  . Diabetes mellitus without complication ([place]) [id number]  . Tooth pain 06/21/2013  . Orthostatic lightheadedness 05/17/2013  . Health maintenance examination 05/17/2013  . Groin strain 11/10/2012  . Hypogonadism, male 01/11/2012  . Type II or unspecified type diabetes mellitus without mention of complication, not stated as uncontrolled 07/08/2011  . HTN (hypertension), benign 07/08/2011  . Hyperlipidemia 07/08/2011  . BPH (benign prostatic hypertrophy) 07/08/2011  . Hx of adenomatous colonic polyps 07/08/2011    12:28 PM, 08/18/19 Jerene Pitch, DPT Physical Therapy with [facility]  [phone] office  [place] [address]  [place], [place], [address] Phone: [phone]   Fax:  [phone]  Name: [name] [name] MRN: [id number] Date of Birth: 03-Feb-1951

## 2019-08-21 ENCOUNTER — Encounter: Payer: Self-pay | Admitting: Family Medicine

## 2019-08-22 ENCOUNTER — Other Ambulatory Visit: Payer: Self-pay

## 2019-08-22 ENCOUNTER — Ambulatory Visit (INDEPENDENT_AMBULATORY_CARE_PROVIDER_SITE_OTHER): Payer: Medicare Other | Admitting: Physician Assistant

## 2019-08-22 ENCOUNTER — Encounter (INDEPENDENT_AMBULATORY_CARE_PROVIDER_SITE_OTHER): Payer: Self-pay | Admitting: Physician Assistant

## 2019-08-22 VITALS — BP 108/64 | HR 64 | Temp 98.2°F | Ht 70.0 in | Wt 243.0 lb

## 2019-08-22 DIAGNOSIS — Z6834 Body mass index (BMI) 34.0-34.9, adult: Secondary | ICD-10-CM

## 2019-08-22 DIAGNOSIS — F3289 Other specified depressive episodes: Secondary | ICD-10-CM

## 2019-08-22 DIAGNOSIS — E559 Vitamin D deficiency, unspecified: Secondary | ICD-10-CM

## 2019-08-22 DIAGNOSIS — E669 Obesity, unspecified: Secondary | ICD-10-CM

## 2019-08-22 DIAGNOSIS — E7849 Other hyperlipidemia: Secondary | ICD-10-CM

## 2019-08-22 DIAGNOSIS — Z794 Long term (current) use of insulin: Secondary | ICD-10-CM

## 2019-08-22 DIAGNOSIS — E119 Type 2 diabetes mellitus without complications: Secondary | ICD-10-CM | POA: Diagnosis not present

## 2019-08-22 MED ORDER — VITAMIN D (ERGOCALCIFEROL) 1.25 MG (50000 UNIT) PO CAPS: 50000.0000 [IU] | ORAL_CAPSULE | ORAL | 0 refills | Status: DC

## 2019-08-22 MED ORDER — LIRAGLUTIDE 18 MG/3ML ~~LOC~~ SOPN: 1.8000 mg | PEN_INJECTOR | Freq: Every morning | SUBCUTANEOUS | 0 refills | Status: DC

## 2019-08-22 MED ORDER — BD PEN NEEDLE NANO U/F 32G X 4 MM MISC: 0 refills | Status: DC

## 2019-08-22 MED ORDER — ESCITALOPRAM OXALATE 20 MG PO TABS: ORAL_TABLET | ORAL | 0 refills | Status: DC

## 2019-08-22 MED ORDER — BUPROPION HCL ER (SR) 150 MG PO TB12: 150.0000 mg | ORAL_TABLET | Freq: Every day | ORAL | 0 refills | Status: DC

## 2019-08-23 LAB — LIPID PANEL WITH LDL/HDL RATIO
Cholesterol, Total: 127 mg/dL (ref 100–199)
HDL: 44 mg/dL (ref >39)
LDL Chol Calc (NIH): 67 mg/dL (ref 0–99)
LDL/HDL Ratio: 1.5 ratio (ref 0.0–3.6)
Triglycerides: 82 mg/dL (ref 0–149)
VLDL Cholesterol Cal: 16 mg/dL (ref 5–40)

## 2019-08-23 LAB — COMPREHENSIVE METABOLIC PANEL
ALT: 19 IU/L (ref 0–44)
AST: 11 IU/L (ref 0–40)
Albumin/Globulin Ratio: 2 (ref 1.2–2.2)
Albumin: 4.3 g/dL (ref 3.8–4.8)
Alkaline Phosphatase: 36 IU/L — ABNORMAL LOW (ref 39–117)
BUN/Creatinine Ratio: 26 — ABNORMAL HIGH (ref 10–24)
BUN: 24 mg/dL (ref 8–27)
Bilirubin Total: 0.4 mg/dL (ref 0.0–1.2)
CO2: 23 mmol/L (ref 20–29)
Calcium: 9.6 mg/dL (ref 8.6–10.2)
Chloride: 104 mmol/L (ref 96–106)
Creatinine, Ser: 0.92 mg/dL (ref 0.76–1.27)
GFR calc Af Amer: 100 mL/min/{1.73_m2} (ref >59)
GFR calc non Af Amer: 86 mL/min/{1.73_m2} (ref >59)
Globulin, Total: 2.2 g/dL (ref 1.5–4.5)
Glucose: 105 mg/dL — ABNORMAL HIGH (ref 65–99)
Potassium: 4.2 mmol/L (ref 3.5–5.2)
Sodium: 141 mmol/L (ref 134–144)
Total Protein: 6.5 g/dL (ref 6.0–8.5)

## 2019-08-23 LAB — HEMOGLOBIN A1C
Est. average glucose Bld gHb Est-mCnc: 128 mg/dL
Hgb A1c MFr Bld: 6.1 % — ABNORMAL HIGH (ref 4.8–5.6)

## 2019-08-23 LAB — VITAMIN D 25 HYDROXY (VIT D DEFICIENCY, FRACTURES): Vit D, 25-Hydroxy: 57.1 ng/mL (ref 30.0–100.0)

## 2019-08-23 NOTE — Progress Notes (Signed)
Office: [phone]  /  Fax: [phone]   HPI:   Chief Complaint: OBESITY [name] [name] is here to discuss his progress with his obesity treatment plan. He is on the Category 4 plan and is following his eating plan approximately 85 % of the time. He states he is doing physical therapy exercises 30 minutes 7 times per week. [name] [name] reports that he continues to have issues with excessive snacking, especially after eating "trigger foods". He is having a lot of back pain and he is going to see his neurosurgeon soon. His weight is 243 lb (110.2 kg) today and has had a weight loss of 3 pounds over a period of 5 weeks since his last visit. He has lost 29 lbs since starting treatment with [name].  Diabetes II [name] has a diagnosis of diabetes type II. He is on Victoza, Metformin and Insulin. [name] states fasting BGs range between 100 and 110 and she denies any hypoglycemic episodes. Last A1c was at 6.4 He has been working on intensive lifestyle modifications including diet, exercise, and weight loss to help control his blood glucose levels. Kadian denies nausea, vomiting or diarrhea.  Depression with emotional eating behaviors [name] is on Bupropion and Lexapro. He is struggling with emotional eating and using food for comfort to the extent that it is negatively impacting his health. He often snacks when he is not hungry. [name] sometimes feels he is out of control and then feels guilty that he made poor food choices. He has been working on behavior modification techniques to help reduce his emotional eating and has been somewhat successful. He shows no sign of suicidal or homicidal ideations.  Vitamin D deficiency [name] has a diagnosis of vitamin D deficiency. [name] is currently taking vit D and he denies nausea, vomiting or muscle weakness.  Hyperlipidemia [name] has hyperlipidemia and he is on Atorvastatin. He has been trying to improve his cholesterol levels with intensive lifestyle modification  including a low saturated fat diet, exercise and weight loss. He denies any chest pain.  ASSESSMENT AND PLAN:  Type 2 diabetes mellitus without complication, with long-term current use of insulin (HCC) - Plan: Hemoglobin A1c, Comprehensive metabolic panel, liraglutide (VICTOZA) 18 MG/3ML SOPN, Insulin Pen Needle (BD PEN NEEDLE NANO U/F) 32G X 4 MM MISC  Other hyperlipidemia - Plan: Lipid Panel With LDL/HDL Ratio  Vitamin D deficiency - Plan: VITAMIN D 25 Hydroxy (Vit-D Deficiency, Fractures), Vitamin D, Ergocalciferol, (DRISDOL) 1.25 MG (50000 UT) CAPS capsule  Other depression - with emotional eating - Plan: buPROPion (WELLBUTRIN SR) 150 MG 12 hr tablet, escitalopram (LEXAPRO) 20 MG tablet  Class 1 obesity with serious comorbidity and body mass index (BMI) of 34.0 to 34.9 in adult, unspecified obesity type  Other depression - Plan: buPROPion (WELLBUTRIN SR) 150 MG 12 hr tablet, escitalopram (LEXAPRO) 20 MG tablet  PLAN:  Diabetes II [name] [name] has been given extensive diabetes education by myself today including ideal fasting and post-prandial blood glucose readings, individual ideal Hgb A1c goals and hypoglycemia prevention. We discussed the importance of good blood sugar control to decrease the likelihood of diabetic complications such as nephropathy, neuropathy, limb loss, blindness, coronary artery disease, and death. We discussed the importance of intensive lifestyle modification including diet, exercise and weight loss as the first line treatment for diabetes. [name] agrees to continue Victoza 1.8 mg sub Q daily #3 pens with no refills and nano needles #100 with no refills and follow up at the agreed upon time.  Depression with Emotional Eating  Behaviors We discussed behavior modification techniques today to help [name] [name] deal with his emotional eating and depression. He has agreed to continue Wellbutrin SR 150 mg qd #30 with no refills and Lexapro 20 mg daily #30 with no refills and follow up  as directed.  Vitamin D Deficiency [name] [name] was informed that low vitamin D levels contributes to fatigue and are associated with obesity, breast, and colon cancer. [name] [name] agrees to continue to take prescription Vit D @50 ,000 IU every week #4 with no refills and he will follow up for routine testing of vitamin D, at least 2-3 times per year. He was informed of the risk of over-replacement of vitamin D and agrees to not increase his dose unless he discusses this with [name] first. We will check labs and he will follow up as directed.  Hyperlipidemia [name] [name] was informed of the American Heart Association Guidelines emphasizing intensive lifestyle modifications as the first line treatment for hyperlipidemia. We discussed many lifestyle modifications today in depth, and [name] [name] will continue to work on decreasing saturated fats such as fatty red meat, butter and many fried foods. He will also increase vegetables and lean protein in his diet and continue to work on exercise and weight loss efforts. We will check labs and [name] [name] will follow up as directed.  Obesity [name] [name] is currently in the action stage of change. As such, his goal is to continue with weight loss efforts He has agreed to follow the Category 4 plan [name] [name] has been instructed to work up to a goal of 150 minutes of combined cardio and strengthening exercise per week for weight loss and overall health benefits. We discussed the following Behavioral Modification Strategies today: keeping healthy foods in the home, better snacking choices, work on meal planning and easy cooking plans and ways to avoid night time snacking  Willliam has agreed to follow up with our clinic in 4 weeks. He was informed of the importance of frequent follow up visits to maximize his success with intensive lifestyle modifications for his multiple health conditions.  ALLERGIES: Allergies  Allergen Reactions   Simvastatin Other (See Comments)    myalgias   Antihistamines,  Diphenhydramine-Type Other (See Comments)    depression   Codeine Nausea Only   Penicillins Hives   Sulfa Antibiotics Hives    MEDICATIONS: Current Outpatient Medications on File Prior to Visit  Medication Sig Dispense Refill   aspirin 325 MG tablet Take 325 mg by mouth daily.       atorvastatin (LIPITOR) 80 MG tablet TAKE 1 TABLET BY MOUTH EVERY DAY 90 tablet 1   Choline Fenofibrate (FENOFIBRIC ACID) 135 MG CPDR TAKE ONE CAPSULE BY MOUTH EVERY DAY 90 capsule 1   finasteride (PROSCAR) 5 MG tablet TAKE 1 TABLET BY MOUTH EVERY DAY 90 tablet 1   fluticasone (FLONASE) 50 MCG/ACT nasal spray Place 2 sprays into both nostrils daily. 48 g 1   gabapentin (NEURONTIN) 300 MG capsule Take 300 mg by mouth 3 (three) times daily.     glucose blood test strip Use as instructed 100 each 0   Insulin Detemir (LEVEMIR FLEXTOUCH) 100 UNIT/ML Pen 56 U SQ once every day 15 mL 11   MELATONIN PO Take by mouth.     metFORMIN (GLUCOPHAGE) 1000 MG tablet TAKE 1 TABLET BY MOUTH TWICE DAILY WITH A MEAL 180 tablet 1   Naproxen Sodium (ALEVE) 220 MG CAPS Take 2 capsules by mouth at bedtime as needed.       Omega-3 Fatty Acids (FISH  OIL) 1000 MG CAPS Take 1 capsule by mouth 2 (two) times daily.     ONE TOUCH ULTRA TEST test strip TEST THREE TIMES DAILY 100 each 11   SALINE NASAL SPRAY NA Place into the nose daily as needed.     tamsulosin (FLOMAX) 0.4 MG CAPS capsule TAKE 2 CAPSULES BY MOUTH AT BEDTIME 180 capsule 1   vitamin B-12 (CYANOCOBALAMIN) 1000 MCG tablet Take 1,000 mcg by mouth daily.       No current facility-administered medications on file prior to visit.     PAST MEDICAL HISTORY: Past Medical History:  Diagnosis Date   Allergic rhinitis, unspecified    Allergy testing via allergist 04/2017--mostly neg.  Dr. Donneta Romberg recommended referral to ENT so i did this.   Back pain    LBP->DDD/spondylosis w/ intermittent radiulopathy-type leg pains, + bilat hip and thigh pain-->Dr. Vertell Limber to do  MRI as of 05/2019   BPH (benign prostatic hypertrophy)    Chronic nasal congestion    DDD (degenerative disc disease), lumbar 2010   laminectomy 01/2009.  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   Depression    Hospitalized for suicidal ideation 62.  Has been on lexapro since 10/2008.   Diabetes mellitus type II    Dx'd approximately 06/2008.  No retinopathy as of 02/2017 ophth.   Erectile dysfunction    History of colon polyps 01/2016   Recall 3 yrs   History of pneumonia 2008   Hospitalized   History of scarlet fever    age 80   Hyperlipidemia, mixed 1993   myalgias on pravastatin   Hypertension 2009   "marked chronic cardiomegaly" on CXR 01/2009 per old records.   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  02/21/16: tubular adenoma.  Recall 3 yrs (Dr. Ardis Hughs).   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   ELECTROCARDIOGRAM  01/29/2009   NORMAL   LUMBAR LAMINECTOMY  01/2009   TONSILLECTOMY AND ADENOIDECTOMY     age 52   TRANSTHORACIC ECHOCARDIOGRAM  02/22/2018   EF 55-60%, grd I DD   URETHRAL DILATION     2 as a child, one as an adult   VASECTOMY  1994   VITRECTOMY      SOCIAL HISTORY: Social History   Tobacco Use   Smoking status: Former Smoker   Smokeless tobacco: Never Used  Substance Use Topics   Alcohol use: Yes    Comment: social, 3-4 drinks month     Drug use: No    FAMILY HISTORY: Family History  Problem Relation Age of Onset   Heart disease Mother    Hyperlipidemia Mother    Stroke Mother    Diabetes Mother    Depression Mother    Alzheimer's disease Mother  Stroke Father    Hyperlipidemia Father    Heart disease Father    Diabetes Father    Hypertension Father    Obesity Father    Alcohol abuse Sister    Depression Sister     ROS: Review of Systems  Constitutional: Positive for weight loss.  Cardiovascular: Negative for chest pain.  Gastrointestinal: Negative for diarrhea, nausea and vomiting.  Musculoskeletal: Positive for back pain.       Negative for muscle weakness  Endo/Heme/Allergies:       Negative for hypoglycemia  Psychiatric/Behavioral: Positive for depression. Negative for suicidal ideas.    PHYSICAL EXAM: Blood pressure 108/64, pulse 64, temperature 98.2 F (36.8 C), temperature source Oral, height 5\' 10"  (1.778 m), weight 243 lb (110.2 kg), SpO2 97 %. Body mass index is 34.87 kg/m. Physical Exam Vitals signs reviewed.  Constitutional:      Appearance: Normal appearance. He is well-developed. He is obese.  Cardiovascular:     Rate and Rhythm: Normal rate.  Pulmonary:     Effort: Pulmonary effort is normal.  Musculoskeletal: Normal range of motion.  Skin:    General: Skin is warm and dry.  Neurological:     Mental Status: He is alert and oriented to person, place, and time.  Psychiatric:        Mood and Affect: Mood normal.        Behavior: Behavior normal.        Thought Content: Thought content does not include homicidal or suicidal ideation.     RECENT LABS AND TESTS: BMET    Component Value Date/Time   NA 141 08/22/2019 0935   K 4.2 08/22/2019 0935   CL 104 08/22/2019 0935   CO2 23 08/22/2019 0935   GLUCOSE 105 (H) 08/22/2019 0935   GLUCOSE 113 (H) 08/10/2018 1033   BUN 24 08/22/2019 0935   CREATININE 0.92 08/22/2019 0935   CREATININE 0.94 01/18/2017 0832    CALCIUM 9.6 08/22/2019 0935   GFRNONAA 86 08/22/2019 0935   GFRAA 100 08/22/2019 0935   Lab Results  Component Value Date   HGBA1C 6.1 (H) 08/22/2019   HGBA1C 6.4 05/19/2019   HGBA1C 6.1 (H) 02/14/2019   HGBA1C 5.8 (A) 11/14/2018   HGBA1C 6.3 08/10/2018   No results found for: INSULIN CBC    Component Value Date/Time   WBC 7.2 02/14/2018 0848   WBC 6.2 05/21/2014 1541   RBC 4.63 02/14/2018 0848   RBC 4.45 05/21/2014 1541   HGB 13.4 02/14/2018 0848   HCT 39.3 02/14/2018 0848   PLT 211.0 05/21/2014 1541   MCV 85 02/14/2018 0848   MCH 28.9 02/14/2018 0848   MCHC 34.1 02/14/2018 0848   MCHC 33.3 05/21/2014 1541   RDW 14.1 02/14/2018 0848   LYMPHSABS 2.5 02/14/2018 0848   MONOABS 0.4 05/21/2014 1541   EOSABS 0.2 02/14/2018 0848   BASOSABS 0.0 02/14/2018 0848   Iron/TIBC/Ferritin/ %Sat No results found for: IRON, TIBC, FERRITIN, IRONPCTSAT Lipid Panel     Component Value Date/Time   CHOL 127 08/22/2019 0935   TRIG 82 08/22/2019 0935   HDL 44 08/22/2019 0935   CHOLHDL 3 08/10/2018 1033   VLDL 18.2 08/10/2018 1033   LDLCALC 72 02/14/2019 1019   LDLDIRECT 112.0 07/31/2016 1116   Hepatic Function Panel     Component Value Date/Time   PROT 6.5 08/22/2019 0935   ALBUMIN 4.3 08/22/2019 0935   AST 11 08/22/2019 0935   ALT 19 08/22/2019 0935   ALKPHOS 36 (L)  08/22/2019 0935   BILITOT 0.4 08/22/2019 0935      Component Value Date/Time   TSH 2.030 02/14/2018 0848   TSH 1.46 05/21/2014 1541   TSH 2.50 05/10/2013 0832     Ref. Range 02/14/2019 10:19  Vitamin D, 25-Hydroxy Latest Ref Range: 30.0 - 100.0 ng/mL 12.0 (L)    OBESITY BEHAVIORAL INTERVENTION VISIT  Today's visit was # 26  Starting weight: 272 lbs Starting date: 02/14/2018 Today's weight : 243 lbs Today's date: 08/22/2019 Total lbs lost to date: 29    08/22/2019  Height 5\' 10"  (1.778 m)  Weight 243 lb (110.2 kg)  BMI (Calculated) 34.87  BLOOD PRESSURE - SYSTOLIC [id number]  BLOOD PRESSURE - DIASTOLIC 64    Body Fat % 37.3 %  Total Body Water (lbs) 116 lbs    ASK: We discussed the diagnosis of obesity with Margarita Rana today and Reis agreed to give [name] permission to discuss obesity behavioral modification therapy today.  ASSESS: Hassel has the diagnosis of obesity and his BMI today is 34.87 Lynton is in the action stage of change   ADVISE: Daneil was educated on the multiple health risks of obesity as well as the benefit of weight loss to improve his health. He was advised of the need for long term treatment and the importance of lifestyle modifications to improve his current health and to decrease his risk of future health problems.  AGREE: Multiple dietary modification options and treatment options were discussed and  Demontre agreed to follow the recommendations documented in the above note.  ARRANGE: Tyden was educated on the importance of frequent visits to treat obesity as outlined per CMS and USPSTF guidelines and agreed to schedule his next follow up appointment today.  Corey Skains, am acting as transcriptionist for Abby Potash, PA-C I, Abby Potash, PA-C have reviewed above note and agree with its content

## 2019-09-01 ENCOUNTER — Encounter (HOSPITAL_COMMUNITY): Payer: Self-pay | Admitting: Physical Therapy

## 2019-09-01 ENCOUNTER — Ambulatory Visit (HOSPITAL_COMMUNITY): Payer: Medicare Other | Attending: Neurosurgery | Admitting: Physical Therapy

## 2019-09-01 ENCOUNTER — Other Ambulatory Visit: Payer: Self-pay

## 2019-09-01 DIAGNOSIS — M5442 Lumbago with sciatica, left side: Secondary | ICD-10-CM | POA: Diagnosis not present

## 2019-09-01 DIAGNOSIS — G8929 Other chronic pain: Secondary | ICD-10-CM | POA: Insufficient documentation

## 2019-09-01 DIAGNOSIS — M5441 Lumbago with sciatica, right side: Secondary | ICD-10-CM | POA: Diagnosis not present

## 2019-09-01 DIAGNOSIS — M6281 Muscle weakness (generalized): Secondary | ICD-10-CM | POA: Diagnosis not present

## 2019-09-01 DIAGNOSIS — R791 Abnormal coagulation profile: Secondary | ICD-10-CM | POA: Diagnosis not present

## 2019-09-01 NOTE — Patient Instructions (Signed)
Tandem Stance reps: 3 sets: 1 hold: 30 daily: 1  weekly: 4      Exercise image step 1   Exercise image step 2  stand near sturdy surface   Setup  Begin in a standing upright position with your arms resting at your sides.  Movement  Place one foot directly in front of the other, so you are standing in a heel-to-toe position. Maintain your balance in this stance. Tip  Try not to move your arms away from your body and make sure to keep your back straight. Disclaimer: This program provides exercises related to your condition that you can perform at home. As there is a risk of injury with any activity, use caution when performing exercises. If you experience any pain or discomfort, discontinue the exercises and contact your health care provider.  Login URL: [email or web address] . Access Code: [email or web address] . Date printed: 09/01/2019 Page 1  Half Tandem Stance Balance with Head Rotation reps: 5 sets: 3 daily: 1 weekly: 4   Exercise image step 1   Exercise image step 2   Exercise image step 3  stand near sturdy surface and start with 2 finger support - work towards decreasing support   Setup  Begin standing in an upright position with one foot staggered approximately 1/2 of its length back from your other foot. Movement  Keep your eyes open and slowly turn your head from side to side. Continue for the prescribed time, then repeat with your opposite foot forward. Tip  Make sure to maintain your balance during the exercise. Seated Hip Hinge with Dowel reps: 10 sets: 1   Exercise image step 1   Exercise image step 2  throughout the day   Setup  Begin sitting upright holding a dowel rod against your back. It should be in contact with your head, mid-back, and tailbone. Movement  Lean forward, bending at your hips and keeping your back straight. Return to the starting position and repeat. Tip  Make sure the dowel stays in contact with all three points on your back as you bend.   Disclaimer: This program provides exercises related to your condition that you can perform at home. As there is a risk of injury with any activity, use caution when performing exercises. If you experience any pain or discomfort, discontinue the exercises and contact your health care provider.  Login URL: [email or web address] . Access Code: [email or web address] . Date printed: 09/01/2019 Page 2  Single Leg Stance with Support reps: 3 sets: 1 hold: 30 daily: 1  weekly: 4      Exercise image step 1   Exercise image step 2  one finger down on sturdy surface   Setup  Begin in a standing upright position holding on to a stable object for support. Movement  Lift one foot off the floor and hold this position. Tip  Make sure to maintain your balance during the exercise. Backward Walking with Counter Support reps: 10 sets: 3 weekly: 4   Exercise image step 1   Exercise image step 2  hold onto something, if counter, inside leg is moving back and perform with both sides, focus on keeping foot straight and not twisting about the lumbar spine. think tall   Setup  Begin in a standing upright position with your hand resting on a counter at your side. Movement  Walk backwards along the side of the counter. When you reach the end of the counter, turn around, and return to the starting position. Tip  Make sure to maintain an upright posture and use the counter to help you balance as needed.

## 2019-09-01 NOTE — Therapy (Signed)
[place], [place], [address] Phone: [phone]   Fax:  [phone]  Physical Therapy Treatment  Patient Details  Name: [name] [name] MRN: [id number] Date of Birth: 1951/10/17 Referring Provider (PT): Erline Levine, MD  PHYSICAL THERAPY DISCHARGE SUMMARY  Visits from Start of Care: 12  Current functional level related to goals / functional outcomes: 2/3 STG met, 0/3 LTG met   Remaining deficits: Balance, tightness in hamstrings and walking endurance   Education / Equipment: Educated patient on HEP, progress made and to be made with continued HEP.  Plan: Patient agrees to discharge.  Patient goals were partially met. Patient is being discharged due to being pleased with the current functional level.  ?????       Encounter Date: 09/01/2019  PT End of Session - 09/01/19 1049    Visit Number  12    Number of Visits  12    Date for PT Re-Evaluation  08/24/19    Authorization Type  Medicare Part A and B    Authorization Time Period  07/13/19-08/24/19    Authorization - Visit Number  6    Authorization - Number of Visits  10    PT Start Time  1050    PT Stop Time  1135    PT Time Calculation (min)  45 min    Activity Tolerance  Patient tolerated treatment well    Behavior During Therapy  WFL for tasks assessed/performed       Past Medical History:  Diagnosis Date  . Allergic rhinitis, unspecified    Allergy testing via allergist 04/2017--mostly neg.  Dr. Donneta Romberg recommended referral to ENT so i did this.  . Back pain    LBP->DDD/spondylosis w/ intermittent radiulopathy-type leg pains, + bilat hip and thigh pain-->Dr. Vertell Limber to do MRI as of 05/2019  . BPH (benign prostatic hypertrophy)   . Chronic nasal congestion   . DDD (degenerative disc disease), lumbar 2010   laminectomy 01/2009.  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  . Depression    Hospitalized for  suicidal ideation 1992.  Has been on lexapro since 10/2008.  . Diabetes mellitus type II    Dx'd approximately 06/2008.  No retinopathy as of 02/2017 ophth.  . Erectile dysfunction   . History of colon polyps 01/2016   Recall 3 yrs  . History of pneumonia 2008   Hospitalized  . History of scarlet fever    age 35  . Hyperlipidemia, mixed 1993   myalgias on pravastatin  . Hypertension 2009   "marked chronic cardiomegaly" on CXR 01/2009 per old records.  . 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 Insomnia   . Keratoconus    dx'd 1973  . Memory changes   . OSA on CPAP 2004; 2019   Back on CPAP 04/2018  . Osteoarthritis of both knees    Mild plain film changes in medial compartment bilat  . Restless leg    gabapentin helpful  . Rhinitis, chronic   . 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 APPENDECTOMY  1972  . COLONOSCOPY  X 4   Most recent was 2007 [place], [place]), with removal of polyps in the first two.  02/21/16: tubular adenoma.  Recall 3 yrs (Dr. Ardis Hughs).  . 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 ELECTROCARDIOGRAM  01/29/2009   NORMAL  . LUMBAR LAMINECTOMY  01/2009  . TONSILLECTOMY AND ADENOIDECTOMY     age 43  . TRANSTHORACIC ECHOCARDIOGRAM  02/22/2018   EF 55-60%, grd I DD  . URETHRAL DILATION     2 as a child, one as an adult  . VASECTOMY  1994  . VITRECTOMY      There were no vitals filed for this visit.  Subjective Assessment - 09/01/19 1054    Subjective  Reports in the last 24 hours he had pain in the small of his back and in his hamstrings. States it was at a 5/10. Reports he was able to go up and down ladders while [place] without difficulty, but later in the week he had difficulty with stairs and walking around. States he found out one thing, states he thinks he had his cane too short and he raised the cane and it helped.  Reports no current symptoms.    Pertinent History  L4-5 laminectomy    Limitations  Walking;Standing    Patient Stated Goals  to be able to walk his dogs again and cook/prepare large meals when entertaining friends/family    Currently in Pain?  No/denies         [facility] PT Assessment - 09/01/19 0001      Observation/Other Assessments   Focus on Therapeutic Outcomes (FOTO)   50% limited      Strength   Right Hip Flexion  5/5   5   Right Hip Extension  5/5   4   Right Hip ABduction  5/5   4   Left Hip Flexion  5/5   5   Left Hip Extension  5/5   4+   Left Hip ABduction  4+/5   4   Right Knee Flexion  5/5    Right Knee Extension  5/5    Left Knee Flexion  5/5    Left Knee Extension  5/5    Right Ankle Dorsiflexion  5/5    Left Ankle Dorsiflexion  5/5      Balance   Balance Assessed  Yes      Static Standing Balance   Static Standing Balance -  Activities   Single Leg Stance - Right Leg;Single Leg Stance - Left Leg;Tandam Stance - Right Leg;Tandam Stance - Left Leg   SLS R 11 seconds, SLS L 4 seconds, tandem 30 seconds bilat                  [facility] Adult PT Treatment/Exercise - 09/01/19 0001      Lumbar Exercises: Seated   Other Seated Lumbar Exercises  hip hinge x10 - performed 3 times during session       Knee/Hip Exercises: Standing   Other Standing Knee Exercises  SLS with one finger support 3x30" Bilat, tandem no UE support 3x30" bilat, tandem with cervical rotation 4x5 bilat with 2 finger support    Other Standing Knee Exercises  backwards walking no support (excessive lumbar rotation noted --> Ue support 3x 10 steps Each             PT Education - 09/01/19 1152    Education Details  educated patient on HEP, movement patterns, importance of focsuing on hip and pelvis mobility, progress made and anticipated progress to make    Person(s) Educated  Patient    Methods  Explanation;Handout    Comprehension  Verbalized understanding  PT  Short Term Goals - 09/01/19 1057      PT SHORT TERM GOAL #1   Title  Patient will be independent with HEP, updated PRN, to improve activity tolerance, balance, and strength.    Time  2    Period  Weeks    Status  Achieved    Target Date  07/27/19      PT SHORT TERM GOAL #2   Title  Patient will improve SLS to 10 seconds or greater for bil LE, to improve stability and safety with gait and stair negotiation.    Baseline  R 11 seconds, L 4 seconds    Time  3    Period  Weeks    Status  Partially Met    Target Date  08/03/19      PT SHORT TERM GOAL #3   Title  Patient will improve hip strength bil by 1/2 grade or more to indicate significant improvement in strength for greater ease of gait and stair mobility.    Time  3    Period  Weeks    Status  Achieved        PT Long Term Goals - 09/01/19 1059      PT LONG TERM GOAL #1   Title  Patient will report being able to walk his dogs for 30 minutes or greater with no increase in back pain to improve QOL.    Baseline  10-15 minutes    Time  6    Period  Weeks    Status  Not Met      PT LONG TERM GOAL #2   Title  Patient will be able to prepare/cook a meal for greater than 1 hour to improve QOL and participation in enjoyable activities.    Baseline  can stand about 20 minutes    Time  6    Period  Weeks    Status  Not Met      PT LONG TERM GOAL #3   Title  Patient will improve FOTO score by 10 points or greater to indicate significant reduction in self reported limitations for mobility related to low back pain.    Baseline  improved 7 points    Time  6    Period  Weeks    Status  Not Met            Plan - 09/01/19 1155    Clinical Impression Statement  Patient present for a reassessment on this date. He has made great progress and is satisfied with progress made thus far. Patient is independent in HEP and agrees with discharge. Answered all questions and provided patient with basic balance program to work on per patient  request. No long term goals have been met at this time but patient has met 2/3 short term goals and is making progress towards the remaining goals. Patient to discharge from physical therapy at this time to HEP.    Personal Factors and Comorbidities  Age;Comorbidity 2    Comorbidities  HTN, DM    Examination-Activity Limitations  Stand;Stairs;Locomotion Level    Examination-Participation Restrictions  Meal Prep;Community Activity;Other   walking his dogs   Stability/Clinical Decision Making  Stable/Uncomplicated    Rehab Potential  Good    PT Frequency  2x / week    PT Duration  6 weeks    PT Treatment/Interventions  ADLs/Self Care Home Management;Aquatic Therapy;Cryotherapy;Electrical Stimulation;Moist Heat;DME Instruction;Traction;Gait training;Stair training;Functional mobility training;Therapeutic activities;Therapeutic exercise;Balance training;Neuromuscular re-education;Patient/family education;Manual techniques;Passive range  of motion;Dry needling;Joint Manipulations;Spinal Manipulations    PT Next Visit Plan  patient to DC from PT    PT Home Exercise Plan  HEP updated 9/16 hip hinge, bridge, book stretch, piriformis stretch, lumbar extension, chair taps. 9/18 walking program with ball exercises (same exercises as before but with ball)    Consulted and Agree with Plan of Care  Patient       Patient will benefit from skilled therapeutic intervention in order to improve the following deficits and impairments:  Decreased activity tolerance, Decreased balance, Decreased range of motion, Decreased mobility, Difficulty walking, Decreased strength  Visit Diagnosis: Chronic midline low back pain with bilateral sciatica  Muscle weakness (generalized)  Pro time increased     Problem List Patient Active Problem List   Diagnosis Date Noted  . Other fatigue 02/14/2018  . Shortness of breath on exertion 02/14/2018  . Type 2 diabetes mellitus with hyperglycemia, with long-term current use  of insulin ([place]) 02/14/2018  . Abnormal EKG 02/14/2018  . Maxillary sinusitis, acute 12/17/2015  . Cough 12/17/2015  . Fever 12/17/2015  . Diabetes mellitus without complication ([place]) 42/99/8069  . Tooth pain 06/21/2013  . Orthostatic lightheadedness 05/17/2013  . Health maintenance examination 05/17/2013  . Groin strain 11/10/2012  . Hypogonadism, male 01/11/2012  . Type II or unspecified type diabetes mellitus without mention of complication, not stated as uncontrolled 07/08/2011  . HTN (hypertension), benign 07/08/2011  . Hyperlipidemia 07/08/2011  . BPH (benign prostatic hypertrophy) 07/08/2011  . Hx of adenomatous colonic polyps 07/08/2011   11:59 AM, 09/01/19 Jerene Pitch, DPT Physical Therapy with [facility]  [phone] office  [place] [address] [place], [place], [address] Phone: [phone]   Fax:  [phone]  Name: [name] [name] MRN: [id number] Date of Birth: 08/09/1951

## 2019-09-06 DIAGNOSIS — M47816 Spondylosis without myelopathy or radiculopathy, lumbar region: Secondary | ICD-10-CM | POA: Diagnosis not present

## 2019-09-06 DIAGNOSIS — M545 Low back pain: Secondary | ICD-10-CM | POA: Diagnosis not present

## 2019-09-06 DIAGNOSIS — Z6836 Body mass index (BMI) 36.0-36.9, adult: Secondary | ICD-10-CM | POA: Diagnosis not present

## 2019-09-06 DIAGNOSIS — M48061 Spinal stenosis, lumbar region without neurogenic claudication: Secondary | ICD-10-CM | POA: Diagnosis not present

## 2019-09-06 DIAGNOSIS — M5416 Radiculopathy, lumbar region: Secondary | ICD-10-CM | POA: Diagnosis not present

## 2019-09-06 DIAGNOSIS — I10 Essential (primary) hypertension: Secondary | ICD-10-CM | POA: Diagnosis not present

## 2019-09-12 DIAGNOSIS — Z23 Encounter for immunization: Secondary | ICD-10-CM | POA: Diagnosis not present

## 2019-09-15 DIAGNOSIS — M6281 Muscle weakness (generalized): Secondary | ICD-10-CM | POA: Diagnosis not present

## 2019-09-15 DIAGNOSIS — M5416 Radiculopathy, lumbar region: Secondary | ICD-10-CM | POA: Diagnosis not present

## 2019-09-15 DIAGNOSIS — M545 Low back pain: Secondary | ICD-10-CM | POA: Diagnosis not present

## 2019-09-19 ENCOUNTER — Encounter (INDEPENDENT_AMBULATORY_CARE_PROVIDER_SITE_OTHER): Payer: Self-pay | Admitting: Physician Assistant

## 2019-09-19 ENCOUNTER — Ambulatory Visit (INDEPENDENT_AMBULATORY_CARE_PROVIDER_SITE_OTHER): Payer: Medicare Other | Admitting: Physician Assistant

## 2019-09-19 ENCOUNTER — Other Ambulatory Visit: Payer: Self-pay

## 2019-09-19 VITALS — BP 117/76 | HR 67 | Temp 97.6°F | Ht 70.0 in | Wt 244.0 lb

## 2019-09-19 DIAGNOSIS — E119 Type 2 diabetes mellitus without complications: Secondary | ICD-10-CM | POA: Diagnosis not present

## 2019-09-19 DIAGNOSIS — E559 Vitamin D deficiency, unspecified: Secondary | ICD-10-CM | POA: Diagnosis not present

## 2019-09-19 DIAGNOSIS — F3289 Other specified depressive episodes: Secondary | ICD-10-CM

## 2019-09-19 DIAGNOSIS — Z794 Long term (current) use of insulin: Secondary | ICD-10-CM

## 2019-09-19 DIAGNOSIS — Z6835 Body mass index (BMI) 35.0-35.9, adult: Secondary | ICD-10-CM | POA: Diagnosis not present

## 2019-09-19 MED ORDER — LIRAGLUTIDE 18 MG/3ML ~~LOC~~ SOPN: 1.8000 mg | PEN_INJECTOR | Freq: Every morning | SUBCUTANEOUS | 0 refills | Status: DC

## 2019-09-19 MED ORDER — ESCITALOPRAM OXALATE 20 MG PO TABS: ORAL_TABLET | ORAL | 0 refills | Status: DC

## 2019-09-19 MED ORDER — BUPROPION HCL ER (SR) 200 MG PO TB12: 200.0000 mg | ORAL_TABLET | Freq: Every day | ORAL | 0 refills | Status: DC

## 2019-09-20 DIAGNOSIS — M6281 Muscle weakness (generalized): Secondary | ICD-10-CM | POA: Diagnosis not present

## 2019-09-20 DIAGNOSIS — M545 Low back pain: Secondary | ICD-10-CM | POA: Diagnosis not present

## 2019-09-20 DIAGNOSIS — M5416 Radiculopathy, lumbar region: Secondary | ICD-10-CM | POA: Diagnosis not present

## 2019-09-20 NOTE — Progress Notes (Signed)
Office: [phone]  /  Fax: [phone]   HPI:   Chief Complaint: OBESITY [name] [name] is here to discuss his progress with his obesity treatment plan. He is on the Category 4 plan and is following his eating plan approximately 80 % of the time. He states he is doing stretch exercises 20 minutes 7 times per week. [name] [name] reports that he does well with his meals, but he continues with some excessive snacking at night. His weight is 244 lb (110.7 kg) today and has had a weight gain of 1 pound over a period of 4 weeks since his last visit. He has lost 28 lbs since starting treatment with [name].  Diabetes II [name] [name] has a diagnosis of diabetes type II. He is on Victoza and Metformin and he denies nausea, vomiting or diarrhea. [name] [name] states fasting BGs average between 100 and 110 and he denies any hypoglycemic episodes. He has been working on intensive lifestyle modifications including diet, exercise, and weight loss to help control his blood glucose levels.  Vitamin D deficiency [name] [name] has a diagnosis of vitamin D deficiency. [name] [name] is on vitamin D and his last level was at goal. He denies nausea, vomiting or muscle weakness.  Depression with emotional eating behaviors [name] [name] is on Wellbutrin and Lexapro and he reports cravings. He is struggling with emotional eating and using food for comfort to the extent that it is negatively impacting his health. He often snacks when he is not hungry. [name] [name] sometimes feels he is out of control and then feels guilty that he made poor food choices. He has been working on behavior modification techniques to help reduce his emotional eating and has been somewhat successful. He shows no sign of suicidal or homicidal ideations.  ASSESSMENT AND PLAN:  Type 2 diabetes mellitus without complication, with long-term current use of insulin (HCC) - Plan: liraglutide (VICTOZA) 18 MG/3ML SOPN  Vitamin D deficiency  Other depression - with emotional eating  - Plan:  escitalopram (LEXAPRO) 20 MG tablet, buPROPion (WELLBUTRIN SR) 200 MG 12 hr tablet  Class 2 severe obesity with serious comorbidity and body mass index (BMI) of 35.0 to 35.9 in adult, unspecified obesity type ([place])  PLAN:  Diabetes II [name] [name] has been given extensive diabetes education by myself today including ideal fasting and post-prandial blood glucose readings, individual ideal Hgb A1c goals and hypoglycemia prevention. We discussed the importance of good blood sugar control to decrease the likelihood of diabetic complications such as nephropathy, neuropathy, limb loss, blindness, coronary artery disease, and death. We discussed the importance of intensive lifestyle modification including diet, exercise and weight loss as the first line treatment for diabetes. [name] [name] agrees to continue Victoza 1.8 mg sub Q #3 pens with no refills and follow up at the agreed upon time.  Vitamin D Deficiency [name] [name] was informed that low vitamin D levels contributes to fatigue and are associated with obesity, breast, and colon cancer. He agrees to change to Vit D @2 ,000 IU daily and discontinue Vit D @50 ,000 IU weekly. He will follow up for routine testing of vitamin D, at least 2-3 times per year. He was informed of the risk of over-replacement of vitamin D and agrees to not increase his dose unless he discusses this with [name] first. [name] [name] agrees to follow up with our clinic in 4 to 5 weeks.  Depression with Emotional Eating Behaviors We discussed behavior modification techniques today to help [name] [name] deal with his emotional eating and depression. He has agreed to continue Wellbutrin SR 200  mg daily with no refills and Lexapro 20 mg daily #30 with no refills and follow up as directed.  Obesity [name] [name] is currently in the action stage of change. As such, his goal is to continue with weight loss efforts He has agreed to keep a food journal with 550 to 700 calories and 45 grams of protein daily at supper and follow the  Category [name] [name] has been instructed to work up to a goal of 150 minutes of combined cardio and strengthening exercise per week for weight loss and overall health benefits. We discussed the following Behavioral Modification Strategies today: keeping healthy foods in the home and work on meal planning and easy cooking plans  We will check indirect calorimetry at the next visit.  [name] [name] has agreed to follow up with our clinic in 4 to 5 weeks. He was informed of the importance of frequent follow up visits to maximize his success with intensive lifestyle modifications for his multiple health conditions.  ALLERGIES: Allergies  Allergen Reactions   Simvastatin Other (See Comments)    myalgias   Antihistamines, Diphenhydramine-Type Other (See Comments)    depression   Codeine Nausea Only   Penicillins Hives   Sulfa Antibiotics Hives    MEDICATIONS: Current Outpatient Medications on File [name] to Visit  Medication Sig Dispense Refill   aspirin 325 MG tablet Take 325 mg by mouth daily.       atorvastatin (LIPITOR) 80 MG tablet TAKE 1 TABLET BY MOUTH EVERY DAY 90 tablet 1   Choline Fenofibrate (FENOFIBRIC ACID) 135 MG CPDR TAKE ONE CAPSULE BY MOUTH EVERY DAY 90 capsule 1   finasteride (PROSCAR) 5 MG tablet TAKE 1 TABLET BY MOUTH EVERY DAY 90 tablet 1   fluticasone (FLONASE) 50 MCG/ACT nasal spray Place 2 sprays into both nostrils daily. 48 g 1   gabapentin (NEURONTIN) 300 MG capsule Take 300 mg by mouth 3 (three) times daily.     glucose blood test strip Use as instructed 100 each 0   Insulin Detemir (LEVEMIR FLEXTOUCH) 100 UNIT/ML Pen 56 U SQ once every day 15 mL 11   Insulin Pen Needle (BD PEN NEEDLE NANO U/F) 32G X 4 MM MISC USE FOR INSULIN INJECTION FOUR TIMES A DAY 100 each 0   MELATONIN PO Take by mouth.     metFORMIN (GLUCOPHAGE) 1000 MG tablet TAKE 1 TABLET BY MOUTH TWICE DAILY WITH A MEAL 180 tablet 1   Naproxen Sodium (ALEVE) 220 MG CAPS Take 2 capsules by  mouth at bedtime as needed.       Omega-3 Fatty Acids (FISH OIL) 1000 MG CAPS Take 1 capsule by mouth 2 (two) times daily.     ONE TOUCH ULTRA TEST test strip TEST THREE TIMES DAILY 100 each 11   SALINE NASAL SPRAY NA Place into the nose daily as needed.     tamsulosin (FLOMAX) 0.4 MG CAPS capsule TAKE 2 CAPSULES BY MOUTH AT BEDTIME 180 capsule 1   vitamin B-12 (CYANOCOBALAMIN) 1000 MCG tablet Take 1,000 mcg by mouth daily.       No current facility-administered medications on file [name] to visit.     PAST MEDICAL HISTORY: Past Medical History:  Diagnosis Date   Allergic rhinitis, unspecified    Allergy testing via allergist 04/2017--mostly neg.  Dr. Donneta Romberg recommended referral to ENT so i did this.   Back pain    LBP->DDD/spondylosis w/ intermittent radiulopathy-type leg pains, + bilat hip and thigh pain-->Dr. Vertell Limber to do MRI as of  05/2019   BPH (benign prostatic hypertrophy)    Chronic nasal congestion    DDD (degenerative disc disease), lumbar 2010   laminectomy 01/2009.  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   Depression    Hospitalized for suicidal ideation 42.  Has been on lexapro since 10/2008.   Diabetes mellitus type II    Dx'd approximately 06/2008.  No retinopathy as of 02/2017 ophth.   Erectile dysfunction    History of colon polyps 01/2016   Recall 3 yrs   History of pneumonia 2008   Hospitalized   History of scarlet fever    age 48   Hyperlipidemia, mixed 1993   myalgias on pravastatin   Hypertension 2009   "marked chronic cardiomegaly" on CXR 01/2009 per old records.   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  02/21/16: tubular adenoma.  Recall 3 yrs (Dr. Ardis Hughs).   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   ELECTROCARDIOGRAM  01/29/2009   NORMAL   LUMBAR LAMINECTOMY  01/2009   TONSILLECTOMY AND ADENOIDECTOMY     age 4   TRANSTHORACIC ECHOCARDIOGRAM  02/22/2018   EF 55-60%, grd I DD   URETHRAL DILATION     2 as a child, one as an adult   VASECTOMY  1994   VITRECTOMY      SOCIAL HISTORY: Social History   Tobacco Use   Smoking status: Former Smoker   Smokeless tobacco: Never Used  Substance Use Topics   Alcohol use: Yes    Comment: social, 3-4 drinks month   Drug use: No    FAMILY HISTORY: Family History  Problem Relation Age of Onset   Heart disease Mother    Hyperlipidemia Mother    Stroke Mother    Diabetes Mother    Depression Mother    Alzheimer's disease Mother    Stroke Father    Hyperlipidemia Father    Heart disease Father    Diabetes Father    Hypertension Father    Obesity Father    Alcohol abuse Sister    Depression Sister     ROS: Review of Systems  Constitutional: Negative for weight loss.  Gastrointestinal: Negative for diarrhea, nausea and vomiting.  Musculoskeletal:       Negative for muscle weakness  Endo/Heme/Allergies:       Negative for hypoglycemia Positive for cravings  Psychiatric/Behavioral: Positive for depression. Negative for suicidal ideas.    PHYSICAL EXAM: Blood pressure 117/76, pulse 67, temperature 97.6 F (36.4 C), temperature source Oral, height 5\' 10"  (1.778 m), weight 244 lb (110.7 kg), SpO2 96 %. Body mass index is 35.01 kg/m. Physical Exam Vitals signs reviewed.  Constitutional:      Appearance: Normal appearance. He is  well-developed. He is obese.  Cardiovascular:     Rate and Rhythm: Normal rate.  Pulmonary:     Effort: Pulmonary effort is normal.  Musculoskeletal: Normal range of motion.  Skin:    General: Skin is warm and dry.  Neurological:     Mental Status: He is alert and oriented to person, place, and time.  Psychiatric:        Mood and Affect: Mood normal.        Behavior: Behavior normal.        Thought Content: Thought content does not include homicidal or suicidal ideation.     RECENT LABS AND TESTS: BMET    Component Value Date/Time   NA 141 08/22/2019 0935   K 4.2 08/22/2019 0935   CL 104 08/22/2019 0935   CO2 23 08/22/2019 0935   GLUCOSE 105 (H) 08/22/2019 0935   GLUCOSE 113 (H) 08/10/2018 1033   BUN 24 08/22/2019 0935   CREATININE 0.92 08/22/2019 0935   CREATININE 0.94 01/18/2017 0832   CALCIUM 9.6 08/22/2019 0935   GFRNONAA 86 08/22/2019 0935   GFRAA 100 08/22/2019 0935   Lab Results  Component Value Date   HGBA1C 6.1 (H) 08/22/2019   HGBA1C 6.4 05/19/2019   HGBA1C 6.1 (H) 02/14/2019   HGBA1C 5.8 (A) 11/14/2018   HGBA1C 6.3 08/10/2018   No results found for: INSULIN CBC    Component Value Date/Time   WBC 7.2 02/14/2018 0848   WBC 6.2 05/21/2014 1541   RBC 4.63 02/14/2018 0848   RBC 4.45 05/21/2014 1541   HGB 13.4 02/14/2018 0848   HCT 39.3 02/14/2018 0848   PLT 211.0 05/21/2014 1541   MCV 85 02/14/2018 0848   MCH 28.9 02/14/2018 0848   MCHC 34.1 02/14/2018 0848   MCHC 33.3 05/21/2014 1541   RDW 14.1 02/14/2018 0848   LYMPHSABS 2.5 02/14/2018 0848   MONOABS 0.4 05/21/2014 1541   EOSABS 0.2 02/14/2018 0848   BASOSABS 0.0 02/14/2018 0848   Iron/TIBC/Ferritin/ %Sat No results found for: IRON, TIBC, FERRITIN, IRONPCTSAT Lipid Panel     Component Value Date/Time   CHOL 127 08/22/2019 0935   TRIG 82 08/22/2019 0935   HDL 44 08/22/2019 0935   CHOLHDL 3 08/10/2018 1033   VLDL 18.2 08/10/2018 1033   LDLCALC 67 08/22/2019 0935   LDLDIRECT 112.0  07/31/2016 1116   Hepatic Function Panel     Component Value Date/Time   PROT 6.5 08/22/2019 0935   ALBUMIN 4.3 08/22/2019 0935   AST 11 08/22/2019 0935   ALT 19 08/22/2019 0935   ALKPHOS 36 (L) 08/22/2019 0935   BILITOT 0.4 08/22/2019 0935      Component Value Date/Time   TSH 2.030 02/14/2018 0848   TSH 1.46 05/21/2014 1541   TSH 2.50 05/10/2013 0832     Ref. Range 08/22/2019 09:35  Vitamin D, 25-Hydroxy Latest Ref Range: 30.0 - 100.0 ng/mL 57.1   OBESITY BEHAVIORAL INTERVENTION VISIT  Today's visit was # 27  Starting weight: 272 lbs Starting date: 02/14/2018 Today's weight : 244 lbs Today's date: 09/19/2019 Total lbs lost to date: 28    09/19/2019  Height 5\' 10"  (1.778 m)  Weight 244 lb (110.7 kg)  BMI (Calculated) 35.01  BLOOD PRESSURE - SYSTOLIC [id number]  BLOOD PRESSURE - DIASTOLIC 76   Body Fat % [id number] %  Total Body Water (lbs) 117 lbs    ASK: We discussed the diagnosis of obesity with Margarita Rana today and Maximilien agreed to give [name] permission to discuss obesity behavioral modification therapy today.  ASSESS: Mankirat has the diagnosis of obesity and his BMI today is 35.01 [place] is in the action stage of change   ADVISE: Eldrige was educated on the multiple health risks of obesity as well as the benefit of weight loss to improve his health. He was advised of the need for long term treatment and the importance of lifestyle modifications to improve his current health and to decrease his risk of future health problems.  AGREE: Multiple dietary modification options and treatment options were discussed and  Nigil agreed to follow the recommendations documented in the above note.  ARRANGE: Cyron was educated on the importance of frequent visits to treat obesity as outlined per CMS and USPSTF guidelines and agreed to schedule his next follow up appointment today.  Corey Skains, am acting as transcriptionist for Abby Potash, PA-C I, Abby Potash, PA-C  have reviewed above note and agree with its content

## 2019-09-25 DIAGNOSIS — M545 Low back pain: Secondary | ICD-10-CM | POA: Diagnosis not present

## 2019-09-25 DIAGNOSIS — M6281 Muscle weakness (generalized): Secondary | ICD-10-CM | POA: Diagnosis not present

## 2019-09-25 DIAGNOSIS — M5416 Radiculopathy, lumbar region: Secondary | ICD-10-CM | POA: Diagnosis not present

## 2019-09-27 DIAGNOSIS — M545 Low back pain: Secondary | ICD-10-CM | POA: Diagnosis not present

## 2019-09-27 DIAGNOSIS — M6281 Muscle weakness (generalized): Secondary | ICD-10-CM | POA: Diagnosis not present

## 2019-09-27 DIAGNOSIS — M5416 Radiculopathy, lumbar region: Secondary | ICD-10-CM | POA: Diagnosis not present

## 2019-10-01 ENCOUNTER — Other Ambulatory Visit: Payer: Self-pay

## 2019-10-01 ENCOUNTER — Emergency Department (HOSPITAL_COMMUNITY)
Admission: EM | Admit: 2019-10-01 | Discharge: 2019-10-01 | Disposition: A | Discharge status: Home / Self Care | Payer: Medicare Other | Attending: Emergency Medicine | Admitting: Emergency Medicine

## 2019-10-01 DIAGNOSIS — Z79899 Other long term (current) drug therapy: Secondary | ICD-10-CM | POA: Diagnosis not present

## 2019-10-01 DIAGNOSIS — E119 Type 2 diabetes mellitus without complications: Secondary | ICD-10-CM | POA: Diagnosis not present

## 2019-10-01 DIAGNOSIS — I10 Essential (primary) hypertension: Secondary | ICD-10-CM | POA: Diagnosis not present

## 2019-10-01 DIAGNOSIS — Y999 Unspecified external cause status: Secondary | ICD-10-CM | POA: Diagnosis not present

## 2019-10-01 DIAGNOSIS — W274XXA Contact with kitchen utensil, initial encounter: Secondary | ICD-10-CM | POA: Insufficient documentation

## 2019-10-01 DIAGNOSIS — Y929 Unspecified place or not applicable: Secondary | ICD-10-CM | POA: Diagnosis not present

## 2019-10-01 DIAGNOSIS — Z87891 Personal history of nicotine dependence: Secondary | ICD-10-CM | POA: Diagnosis not present

## 2019-10-01 DIAGNOSIS — Z7982 Long term (current) use of aspirin: Secondary | ICD-10-CM | POA: Diagnosis not present

## 2019-10-01 DIAGNOSIS — S61209A Unspecified open wound of unspecified finger without damage to nail, initial encounter: Secondary | ICD-10-CM

## 2019-10-01 DIAGNOSIS — Z7984 Long term (current) use of oral hypoglycemic drugs: Secondary | ICD-10-CM | POA: Insufficient documentation

## 2019-10-01 DIAGNOSIS — S61002A Unspecified open wound of left thumb without damage to nail, initial encounter: Secondary | ICD-10-CM | POA: Insufficient documentation

## 2019-10-01 DIAGNOSIS — Y93G1 Activity, food preparation and clean up: Secondary | ICD-10-CM | POA: Insufficient documentation

## 2019-10-01 MED ORDER — LIDOCAINE-EPINEPHRINE-TETRACAINE (LET) SOLUTION
3.0000 mL | Freq: Once | NASAL | Status: AC
  Administered 2019-10-01: 3 mL via TOPICAL
  Filled 2019-10-01: qty 3

## 2019-10-01 NOTE — Discharge Instructions (Signed)
Keep dressing on for the next 24 hours. After 24 hours, remove dressing.  If it is sticking, wet the dressing with water instead of pulling to prevent reopening the wound. Rewrap after washing with soap and water and do this daily until skin has healed. Monitor for signs of infection.  If you have increased pain, redness, pus, numbness, fevers, change in her blood sugars return to the emergency room. Follow-up with your primary care doctor as needed for wound recheck. Return to the emergency room with any new, worsening, concerning symptoms.

## 2019-10-01 NOTE — ED Provider Notes (Signed)
[facility] EMERGENCY DEPARTMENT Provider Note   CSN: [id number] Arrival date & time: 10/01/19  1343     History   Chief Complaint Chief Complaint  Patient presents with  . Laceration    HPI [name] [name] is a 68 y.o. male presenting for evaluation of left thumb injury.  Patient states just prior to arrival he was using a vegetable peeler when he slipped and cut the distal tip of his left thumb.  He reports no pain.  Believes his tetanus is up-to-date.  He has had persistent bleeding from the site despite wrapping it.  He has a history of diabetes, though sugars are well controlled.  He is not on blood thinners.  He denies injury elsewhere.  He denies numbness or tingling.      HPI  Past Medical History:  Diagnosis Date  . Allergic rhinitis, unspecified    Allergy testing via allergist 04/2017--mostly neg.  Dr. Donneta Romberg recommended referral to ENT so i did this.  . Back pain    LBP->DDD/spondylosis w/ intermittent radiulopathy-type leg pains, + bilat hip and thigh pain-->Dr. Vertell Limber to do MRI as of 05/2019  . BPH (benign prostatic hypertrophy)   . Chronic nasal congestion   . DDD (degenerative disc disease), lumbar 2010   laminectomy 01/2009.  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  . Depression    Hospitalized for suicidal ideation 1992.  Has been on lexapro since 10/2008.  . Diabetes mellitus type II    Dx'd approximately 06/2008.  No retinopathy as of 02/2017 ophth.  . Erectile dysfunction   . History of colon polyps 01/2016   Recall 3 yrs  . History of pneumonia 2008   Hospitalized  . History of scarlet fever    age 44  . Hyperlipidemia, mixed 1993   myalgias on pravastatin  . Hypertension 2009   "marked chronic cardiomegaly" on CXR 01/2009 per old records.  . 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 Insomnia   . Keratoconus    dx'd 1973  . Memory changes   . OSA on CPAP 2004; 2019   Back on CPAP  04/2018  . Osteoarthritis of both knees    Mild plain film changes in medial compartment bilat  . Restless leg    gabapentin helpful  . Rhinitis, chronic   . Urethral stricture    dilated X 2 as a child and surgery for this (?) around 1990    Patient Active Problem List   Diagnosis Date Noted  . Other fatigue 02/14/2018  . Shortness of breath on exertion 02/14/2018  . Type 2 diabetes mellitus with hyperglycemia, with long-term current use of insulin ([place]) 02/14/2018  . Abnormal EKG 02/14/2018  . Maxillary sinusitis, acute 12/17/2015  . Cough 12/17/2015  . Fever 12/17/2015  . Diabetes mellitus without complication ([place]) [id number]  . Tooth pain 06/21/2013  . Orthostatic lightheadedness 05/17/2013  . Health maintenance examination 05/17/2013  . Groin strain 11/10/2012  . Hypogonadism, male 01/11/2012  . Type II or unspecified type diabetes mellitus without mention of complication, not stated as uncontrolled 07/08/2011  . HTN (hypertension), benign 07/08/2011  . Hyperlipidemia 07/08/2011  . BPH (benign prostatic hypertrophy) 07/08/2011  . Hx of adenomatous colonic polyps 07/08/2011    Past Surgical History:  Procedure Laterality Date  . APPENDECTOMY  1972  . COLONOSCOPY  X 4   Most recent was 2007 [place], [place]), with removal of polyps in the first two.  02/21/16: tubular  adenoma.  Recall 3 yrs (Dr. Ardis Hughs).  . 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 ELECTROCARDIOGRAM  01/29/2009   NORMAL  . LUMBAR LAMINECTOMY  01/2009  . TONSILLECTOMY AND ADENOIDECTOMY     age 69  . TRANSTHORACIC ECHOCARDIOGRAM  02/22/2018   EF 55-60%, grd I DD  . URETHRAL DILATION     2 as a child, one as an adult  . VASECTOMY  1994  . VITRECTOMY          Home Medications    Prior to Admission medications   Medication Sig Start Date End Date Taking? Authorizing Provider  aspirin 325 MG tablet Take 325 mg by mouth daily.      [provider]  atorvastatin (LIPITOR) 80 MG tablet TAKE 1  TABLET BY MOUTH EVERY DAY 08/01/19   McGowen, Adrian Blackwater, MD  buPROPion (WELLBUTRIN SR) 200 MG 12 hr tablet Take 1 tablet (200 mg total) by mouth daily. 09/19/19   Abby Potash, PA-C  Choline Fenofibrate (FENOFIBRIC ACID) 135 MG CPDR TAKE ONE CAPSULE BY MOUTH EVERY DAY 08/01/19   McGowen, Adrian Blackwater, MD  escitalopram (LEXAPRO) 20 MG tablet TAKE 1 TABLET(20 MG) BY MOUTH DAILY 09/19/19   Abby Potash, PA-C  finasteride (PROSCAR) 5 MG tablet TAKE 1 TABLET BY MOUTH EVERY DAY 07/13/19   McGowen, Adrian Blackwater, MD  fluticasone (FLONASE) 50 MCG/ACT nasal spray Place 2 sprays into both nostrils daily. 02/13/19   McGowen, Adrian Blackwater, MD  gabapentin (NEURONTIN) 300 MG capsule Take 300 mg by mouth 3 (three) times daily. 05/17/19   Erline Levine, MD  glucose blood test strip Use as instructed 05/31/19   Abby Potash, PA-C  Insulin Detemir (LEVEMIR FLEXTOUCH) 100 UNIT/ML Pen 56 U SQ once every day 03/27/19   Tammi Sou, MD  Insulin Pen Needle (BD PEN NEEDLE NANO U/F) 32G X 4 MM MISC USE FOR INSULIN INJECTION FOUR TIMES A DAY 08/22/19   Abby Potash, PA-C  liraglutide (VICTOZA) 18 MG/3ML SOPN Inject 0.3 mLs (1.8 mg total) into the skin every morning. 09/19/19   Abby Potash, PA-C  MELATONIN PO Take by mouth.    [provider]  metFORMIN (GLUCOPHAGE) 1000 MG tablet TAKE 1 TABLET BY MOUTH TWICE DAILY WITH A MEAL 07/14/19   McGowen, Adrian Blackwater, MD  Naproxen Sodium (ALEVE) 220 MG CAPS Take 2 capsules by mouth at bedtime as needed.      [provider]  Omega-3 Fatty Acids (FISH OIL) 1000 MG CAPS Take 1 capsule by mouth 2 (two) times daily.    [provider]  ONE TOUCH ULTRA TEST test strip TEST THREE TIMES DAILY 03/07/18   McGowen, Adrian Blackwater, MD  SALINE NASAL SPRAY NA Place into the nose daily as needed.    [provider]  tamsulosin (FLOMAX) 0.4 MG CAPS capsule TAKE 2 CAPSULES BY MOUTH AT BEDTIME 08/01/19   McGowen, Adrian Blackwater, MD  vitamin B-12 (CYANOCOBALAMIN) 1000 MCG tablet Take  1,000 mcg by mouth daily.      [provider]    Family History Family History  Problem Relation Age of Onset  . Heart disease Mother   . Hyperlipidemia Mother   . Stroke Mother   . Diabetes Mother   . Depression Mother   . Alzheimer's disease Mother   . Stroke Father   . Hyperlipidemia Father   . Heart disease Father   . Diabetes Father   . Hypertension Father   . Obesity Father   .  Alcohol abuse Sister   . Depression Sister     Social History Social History   Tobacco Use  . Smoking status: Former Research scientist (life sciences)  . Smokeless tobacco: Never Used  Substance Use Topics  . Alcohol use: Yes    Comment: social, 3-4 drinks month  . Drug use: No     Allergies   Simvastatin; Antihistamines, diphenhydramine-type; Codeine; Penicillins; and Sulfa antibiotics   Review of Systems Review of Systems  Skin: Positive for wound.  Hematological: Does not bruise/bleed easily.     Physical Exam Updated Vital Signs BP 128/79 (BP Location: Right Arm)   Pulse 74   Temp 98.2 F (36.8 C) (Oral)   Resp 18   Ht 5\' 10"  (1.778 m)   Wt 110.2 kg   SpO2 97%   BMI 34.87 kg/m   Physical Exam Vitals signs and nursing note reviewed.  Constitutional:      General: He is not in acute distress.    Appearance: He is well-developed.  HENT:     Head: Normocephalic and atraumatic.  Neck:     Musculoskeletal: Normal range of motion.  Pulmonary:     Effort: Pulmonary effort is normal.  Abdominal:     General: There is no distension.  Musculoskeletal: Normal range of motion.       Hands:     Comments: Left distal thumb complete avulsion of the skin, approximately 1 cm long and 4 mm wide.  Minimal oozing.  No injury elsewhere to the thumb.  Good distal cap refill.  Good distal sensation.  Full active range of motion of the thumb without difficulty.  Skin:    General: Skin is warm.     Findings: No rash.  Neurological:     Mental Status: He is alert and oriented to person, place, and  time.      ED Treatments / Results  Labs (all labs ordered are listed, but only abnormal results are displayed) Labs Reviewed - No data to display  EKG None  Radiology No results found.  Procedures Procedures (including critical care time)  Medications Ordered in ED Medications  lidocaine-EPINEPHrine-tetracaine (LET) solution (3 mLs Topical Given 10/01/19 1510)     Initial Impression / Assessment and Plan / ED Course  I have reviewed the triage vital signs and the nursing notes.  Pertinent labs & imaging results that were available during my care of the patient were reviewed by me and considered in my medical decision making (see chart for details).        Patient presenting for evaluation of thumb injury.  Physical examination, he is neurovascularly intact.  Skin was avulsed, as such unable to repair with sutures.  Will apply let and bulky dressing for bleeding control.  Discussed close monitoring for signs of infection and daily changing of the dressing until skin has healed.  At this time, patient appears safe for discharge.  Return precautions given.  Patient states he understands and agrees to plan.  Final Clinical Impressions(s) / ED Diagnoses   Final diagnoses:  Avulsion of finger tip, initial encounter    ED Discharge Orders    None       Franchot Heidelberg, PA-C 10/01/19 1608    Nat Christen, MD 10/02/19 [phone]

## 2019-10-01 NOTE — ED Triage Notes (Addendum)
Pt reports was using a vegetable peeler and reports cut left thumd. Deep laceration noted around left nailbed of thumb. Pressure and dressing applied, moderate bleeding still noted. Pt denies being on blood thinners.

## 2019-10-02 ENCOUNTER — Encounter: Payer: Self-pay | Admitting: Family Medicine

## 2019-10-02 ENCOUNTER — Encounter (INDEPENDENT_AMBULATORY_CARE_PROVIDER_SITE_OTHER): Payer: Self-pay | Admitting: Physician Assistant

## 2019-10-02 NOTE — Telephone Encounter (Signed)
Please advise recommendations

## 2019-10-04 DIAGNOSIS — M6281 Muscle weakness (generalized): Secondary | ICD-10-CM | POA: Diagnosis not present

## 2019-10-04 DIAGNOSIS — M5416 Radiculopathy, lumbar region: Secondary | ICD-10-CM | POA: Diagnosis not present

## 2019-10-04 DIAGNOSIS — M545 Low back pain: Secondary | ICD-10-CM | POA: Diagnosis not present

## 2019-10-06 DIAGNOSIS — M545 Low back pain: Secondary | ICD-10-CM | POA: Diagnosis not present

## 2019-10-06 DIAGNOSIS — M5416 Radiculopathy, lumbar region: Secondary | ICD-10-CM | POA: Diagnosis not present

## 2019-10-06 DIAGNOSIS — M6281 Muscle weakness (generalized): Secondary | ICD-10-CM | POA: Diagnosis not present

## 2019-10-09 DIAGNOSIS — M6281 Muscle weakness (generalized): Secondary | ICD-10-CM | POA: Diagnosis not present

## 2019-10-09 DIAGNOSIS — M5416 Radiculopathy, lumbar region: Secondary | ICD-10-CM | POA: Diagnosis not present

## 2019-10-09 DIAGNOSIS — M545 Low back pain: Secondary | ICD-10-CM | POA: Diagnosis not present

## 2019-10-11 ENCOUNTER — Encounter (INDEPENDENT_AMBULATORY_CARE_PROVIDER_SITE_OTHER): Payer: Self-pay | Admitting: Physician Assistant

## 2019-10-11 DIAGNOSIS — M545 Low back pain: Secondary | ICD-10-CM | POA: Diagnosis not present

## 2019-10-11 DIAGNOSIS — M6281 Muscle weakness (generalized): Secondary | ICD-10-CM | POA: Diagnosis not present

## 2019-10-11 DIAGNOSIS — M5416 Radiculopathy, lumbar region: Secondary | ICD-10-CM | POA: Diagnosis not present

## 2019-10-12 NOTE — Telephone Encounter (Signed)
Please advise 

## 2019-10-16 DIAGNOSIS — M6281 Muscle weakness (generalized): Secondary | ICD-10-CM | POA: Diagnosis not present

## 2019-10-16 DIAGNOSIS — M545 Low back pain: Secondary | ICD-10-CM | POA: Diagnosis not present

## 2019-10-16 DIAGNOSIS — M5416 Radiculopathy, lumbar region: Secondary | ICD-10-CM | POA: Diagnosis not present

## 2019-10-17 ENCOUNTER — Ambulatory Visit (INDEPENDENT_AMBULATORY_CARE_PROVIDER_SITE_OTHER): Payer: Medicare Other | Admitting: Physician Assistant

## 2019-10-17 ENCOUNTER — Encounter (INDEPENDENT_AMBULATORY_CARE_PROVIDER_SITE_OTHER): Payer: Self-pay | Admitting: Physician Assistant

## 2019-10-17 ENCOUNTER — Encounter: Payer: Self-pay | Admitting: Physician Assistant

## 2019-10-17 ENCOUNTER — Other Ambulatory Visit: Payer: Self-pay

## 2019-10-17 VITALS — BP 125/70 | HR 71 | Temp 98.0°F | Ht 70.0 in | Wt 241.0 lb

## 2019-10-17 DIAGNOSIS — F3289 Other specified depressive episodes: Secondary | ICD-10-CM | POA: Diagnosis not present

## 2019-10-17 DIAGNOSIS — Z6834 Body mass index (BMI) 34.0-34.9, adult: Secondary | ICD-10-CM | POA: Diagnosis not present

## 2019-10-17 DIAGNOSIS — E669 Obesity, unspecified: Secondary | ICD-10-CM

## 2019-10-17 DIAGNOSIS — R0602 Shortness of breath: Secondary | ICD-10-CM

## 2019-10-17 DIAGNOSIS — Z794 Long term (current) use of insulin: Secondary | ICD-10-CM

## 2019-10-17 DIAGNOSIS — E119 Type 2 diabetes mellitus without complications: Secondary | ICD-10-CM | POA: Diagnosis not present

## 2019-10-17 MED ORDER — BUPROPION HCL ER (SR) 200 MG PO TB12: 200.0000 mg | ORAL_TABLET | Freq: Every day | ORAL | 0 refills | Status: DC

## 2019-10-17 MED ORDER — LIRAGLUTIDE 18 MG/3ML ~~LOC~~ SOPN: 1.8000 mg | PEN_INJECTOR | Freq: Every morning | SUBCUTANEOUS | 0 refills | Status: DC

## 2019-10-17 MED ORDER — ESCITALOPRAM OXALATE 20 MG PO TABS: ORAL_TABLET | ORAL | 1 refills | Status: DC

## 2019-10-18 DIAGNOSIS — M545 Low back pain: Secondary | ICD-10-CM | POA: Diagnosis not present

## 2019-10-18 DIAGNOSIS — M6281 Muscle weakness (generalized): Secondary | ICD-10-CM | POA: Diagnosis not present

## 2019-10-18 DIAGNOSIS — M5416 Radiculopathy, lumbar region: Secondary | ICD-10-CM | POA: Diagnosis not present

## 2019-10-18 NOTE — Progress Notes (Signed)
Office: [phone]  /  Fax: [phone]   HPI:   Chief Complaint: OBESITY [name] [name] is here to discuss his progress with his obesity treatment plan. He is on the Category 4 plan and is following his eating plan approximately 90 % of the time. He states he is exercising 15 minutes 5 times per week. [name] [name] reports that his hunger is better controlled, as he is choosing more protein snacks. He is also eating high protein at dinner with his wife, who is on the low carb plan. His weight is 241 lb (109.3 kg) today and has had a weight loss of 3 pounds over a period of 4 weeks since his last visit. He has lost 31 lbs since starting treatment with [name].  Shortness of Breath  [name] [name] notes shortness of breath with exertion. He has no dizziness or lightheadedness. We will check indirect calorimetry today.  Diabetes II [name] [name] has a diagnosis of diabetes type II. He is on Metformin and Victoza. [name] [name] denies nausea, vomiting, diarrhea or polyphagia. Last A1c was at 6.1 on 08/22/19. He has been working on intensive lifestyle modifications including diet, exercise, and weight loss to help control his blood glucose levels. [name] [name] denies hypoglycemia.  Depression with emotional eating behaviors [name] [name] is struggling with emotional eating and using food for comfort to the extent that it is negatively impacting his health. He often snacks when he is not hungry. [name] [name] sometimes feels he is out of control and then feels guilty that he made poor food choices. He has been working on behavior modification techniques to help reduce his emotional eating and has been somewhat successful. [name] [name] is on Wellbutrin and Lexapro. He has no cravings. His blood pressure is normal. He shows no sign of suicidal or homicidal ideations.  ASSESSMENT AND PLAN:  Shortness of breath on exertion  Type 2 diabetes mellitus without complication, with long-term current use of insulin (HCC) - Plan: liraglutide (VICTOZA) 18 MG/3ML  SOPN  Other depression - with emotional eating  - Plan: escitalopram (LEXAPRO) 20 MG tablet, buPROPion (WELLBUTRIN SR) 200 MG 12 hr tablet  Class 1 obesity with serious comorbidity and body mass index (BMI) of 34.0 to 34.9 in adult, unspecified obesity type  PLAN:  Shortness of Breath [name] [name]'s shortness of breath appears to be obesity related and exercise induced. The indirect calorimeter results showed VO2 of 367 and a REE of 2553. He will continue to work on weight loss and gradually increase exercise to treat his exercise induced shortness of breath.   Diabetes II [name] [name] has been given extensive diabetes education by myself today including ideal fasting and post-prandial blood glucose readings, individual ideal Hgb A1c goals and hypoglycemia prevention. We discussed the importance of good blood sugar control to decrease the likelihood of diabetic complications such as nephropathy, neuropathy, limb loss, blindness, coronary artery disease, and death. We discussed the importance of intensive lifestyle modification including diet, exercise and weight loss as the first line treatment for diabetes. [name] [name] agrees to continue Victoza 1.8 mg subQ every morning #3 with no refills and follow up at the agreed upon time.  Depression with Emotional Eating Behaviors We discussed behavior modification techniques today to help [name] [name] deal with his emotional eating and depression. He agreed to continue Lexapro 20 mg daily #30 with 1 refill and Wellbutrin SR 200 mg daily #30 with no refills and follow up as directed.  Obesity [name] [name] is currently in the action stage of change. As such, his goal is to continue with weight  loss efforts He has agreed to follow the Category 4 plan [name] [name] has been instructed to work up to a goal of 150 minutes of combined cardio and strengthening exercise per week for weight loss and overall health benefits. We discussed the following Behavioral Modification Strategies today:  keeping healthy foods in the home and work on meal planning and easy cooking plans  [name] has agreed to follow up with our clinic in 3 to 4 weeks. He was informed of the importance of frequent follow up visits to maximize his success with intensive lifestyle modifications for his multiple health conditions.  ALLERGIES: Allergies  Allergen Reactions   Simvastatin Other (See Comments)    myalgias   Antihistamines, Diphenhydramine-Type Other (See Comments)    depression   Codeine Nausea Only   Penicillins Hives   Sulfa Antibiotics Hives    MEDICATIONS: Current Outpatient Medications on File Prior to Visit  Medication Sig Dispense Refill   aspirin 325 MG tablet Take 325 mg by mouth daily.       atorvastatin (LIPITOR) 80 MG tablet TAKE 1 TABLET BY MOUTH EVERY DAY 90 tablet 1   Choline Fenofibrate (FENOFIBRIC ACID) 135 MG CPDR TAKE ONE CAPSULE BY MOUTH EVERY DAY 90 capsule 1   finasteride (PROSCAR) 5 MG tablet TAKE 1 TABLET BY MOUTH EVERY DAY 90 tablet 1   fluticasone (FLONASE) 50 MCG/ACT nasal spray Place 2 sprays into both nostrils daily. 48 g 1   gabapentin (NEURONTIN) 300 MG capsule Take 300 mg by mouth 3 (three) times daily.     glucose blood test strip Use as instructed 100 each 0   Insulin Detemir (LEVEMIR FLEXTOUCH) 100 UNIT/ML Pen 56 U SQ once every day 15 mL 11   Insulin Pen Needle (BD PEN NEEDLE NANO U/F) 32G X 4 MM MISC USE FOR INSULIN INJECTION FOUR TIMES A DAY 100 each 0   MELATONIN PO Take by mouth.     metFORMIN (GLUCOPHAGE) 1000 MG tablet TAKE 1 TABLET BY MOUTH TWICE DAILY WITH A MEAL 180 tablet 1   Naproxen Sodium (ALEVE) 220 MG CAPS Take 2 capsules by mouth at bedtime as needed.       Omega-3 Fatty Acids (FISH OIL) 1000 MG CAPS Take 1 capsule by mouth 2 (two) times daily.     ONE TOUCH ULTRA TEST test strip TEST THREE TIMES DAILY 100 each 11   SALINE NASAL SPRAY NA Place into the nose daily as needed.     tamsulosin (FLOMAX) 0.4 MG CAPS capsule  TAKE 2 CAPSULES BY MOUTH AT BEDTIME 180 capsule 1   vitamin B-12 (CYANOCOBALAMIN) 1000 MCG tablet Take 1,000 mcg by mouth daily.       No current facility-administered medications on file prior to visit.     PAST MEDICAL HISTORY: Past Medical History:  Diagnosis Date   Allergic rhinitis, unspecified    Allergy testing via allergist 04/2017--mostly neg.  Dr. Donneta Romberg recommended referral to ENT so i did this.   Back pain    LBP->DDD/spondylosis w/ intermittent radiulopathy-type leg pains, + bilat hip and thigh pain-->Dr. Vertell Limber to do MRI as of 05/2019   BPH (benign prostatic hypertrophy)    Chronic nasal congestion    DDD (degenerative disc disease), lumbar 2010   laminectomy 01/2009.  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   Depression    Hospitalized for suicidal ideation 49.  Has been on lexapro since 10/2008.   Diabetes mellitus type II    Dx'd approximately 06/2008.  No retinopathy as of 02/2017 ophth.   Erectile dysfunction    History of colon polyps 01/2016   Recall 3 yrs   History of pneumonia 2008   Hospitalized   History of scarlet fever    age 46   Hyperlipidemia, mixed 1993   myalgias on pravastatin   Hypertension 2009   "marked chronic cardiomegaly" on CXR 01/2009 per old records.   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  02/21/16: tubular adenoma.  Recall  3 yrs (Dr. Ardis Hughs).   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   ELECTROCARDIOGRAM  01/29/2009   NORMAL   LUMBAR LAMINECTOMY  01/2009   TONSILLECTOMY AND ADENOIDECTOMY     age 89   TRANSTHORACIC ECHOCARDIOGRAM  02/22/2018   EF 55-60%, grd I DD   URETHRAL DILATION     2 as a child, one as an adult   VASECTOMY  1994   VITRECTOMY      SOCIAL HISTORY: Social History   Tobacco Use   Smoking status: Former Smoker   Smokeless tobacco: Never Used  Substance Use Topics   Alcohol use: Yes    Comment: social, 3-4 drinks month   Drug use: No    FAMILY HISTORY: Family History  Problem Relation Age of Onset   Heart disease Mother    Hyperlipidemia Mother    Stroke Mother    Diabetes Mother    Depression Mother    Alzheimer's disease Mother    Stroke Father    Hyperlipidemia Father    Heart disease Father    Diabetes Father    Hypertension Father    Obesity Father    Alcohol abuse Sister    Depression Sister     ROS: Review of Systems  Constitutional: Positive for weight loss.  Gastrointestinal: Negative for diarrhea, nausea and vomiting.  Neurological: Negative for dizziness.       Negative for lightheadedness  Endo/Heme/Allergies:       Negative for hypoglycemia Negative for polyphagia Negative for cravings  Psychiatric/Behavioral: Positive for depression. Negative for suicidal ideas.    PHYSICAL EXAM: Blood pressure 125/70, pulse 71, temperature 98 F (36.7 C), temperature source Oral, height 5\' 10"  (1.778 m), weight 241 lb (109.3 kg), SpO2 97 %. Body mass index is 34.58 kg/m. Physical Exam Vitals signs reviewed.  Constitutional:      Appearance: Normal appearance. He is well-developed. He is obese.  Cardiovascular:     Rate and Rhythm: Normal rate.  Pulmonary:     Effort: Pulmonary effort is normal.  Musculoskeletal: Normal range of motion.  Skin:    General: Skin is warm and dry.  Neurological:     Mental Status: He is alert and  oriented to person, place, and time.  Psychiatric:  Mood and Affect: Mood normal.        Behavior: Behavior normal.        Thought Content: Thought content does not include homicidal or suicidal ideation.     RECENT LABS AND TESTS: BMET    Component Value Date/Time   NA 141 08/22/2019 0935   K 4.2 08/22/2019 0935   CL 104 08/22/2019 0935   CO2 23 08/22/2019 0935   GLUCOSE 105 (H) 08/22/2019 0935   GLUCOSE 113 (H) 08/10/2018 1033   BUN 24 08/22/2019 0935   CREATININE 0.92 08/22/2019 0935   CREATININE 0.94 01/18/2017 0832   CALCIUM 9.6 08/22/2019 0935   GFRNONAA 86 08/22/2019 0935   GFRAA 100 08/22/2019 0935   Lab Results  Component Value Date   HGBA1C 6.1 (H) 08/22/2019   HGBA1C 6.4 05/19/2019   HGBA1C 6.1 (H) 02/14/2019   HGBA1C 5.8 (A) 11/14/2018   HGBA1C 6.3 08/10/2018   No results found for: INSULIN CBC    Component Value Date/Time   WBC 7.2 02/14/2018 0848   WBC 6.2 05/21/2014 1541   RBC 4.63 02/14/2018 0848   RBC 4.45 05/21/2014 1541   HGB 13.4 02/14/2018 0848   HCT 39.3 02/14/2018 0848   PLT 211.0 05/21/2014 1541   MCV 85 02/14/2018 0848   MCH 28.9 02/14/2018 0848   MCHC 34.1 02/14/2018 0848   MCHC 33.3 05/21/2014 1541   RDW 14.1 02/14/2018 0848   LYMPHSABS 2.5 02/14/2018 0848   MONOABS 0.4 05/21/2014 1541   EOSABS 0.2 02/14/2018 0848   BASOSABS 0.0 02/14/2018 0848   Iron/TIBC/Ferritin/ %Sat No results found for: IRON, TIBC, FERRITIN, IRONPCTSAT Lipid Panel     Component Value Date/Time   CHOL 127 08/22/2019 0935   TRIG 82 08/22/2019 0935   HDL 44 08/22/2019 0935   CHOLHDL 3 08/10/2018 1033   VLDL 18.2 08/10/2018 1033   LDLCALC 67 08/22/2019 0935   LDLDIRECT 112.0 07/31/2016 1116   Hepatic Function Panel     Component Value Date/Time   PROT 6.5 08/22/2019 0935   ALBUMIN 4.3 08/22/2019 0935   AST 11 08/22/2019 0935   ALT 19 08/22/2019 0935   ALKPHOS 36 (L) 08/22/2019 0935   BILITOT 0.4 08/22/2019 0935      Component Value  Date/Time   TSH 2.030 02/14/2018 0848   TSH 1.46 05/21/2014 1541   TSH 2.50 05/10/2013 0832     Ref. Range 08/22/2019 09:35  Vitamin D, 25-Hydroxy Latest Ref Range: 30.0 - 100.0 ng/mL 57.1    OBESITY BEHAVIORAL INTERVENTION VISIT  Today's visit was # 28  Starting weight: 272 lbs Starting date: 02/14/2018 Today's weight : 241 lbs Today's date: 10/17/2019 Total lbs lost to date: 31    10/17/2019  Height 5\' 10"  (1.778 m)  Weight 241 lb (109.3 kg)  BMI (Calculated) 34.58  BLOOD PRESSURE - SYSTOLIC 0000000  BLOOD PRESSURE - DIASTOLIC 70   Body Fat % 0000000 %  Total Body Water (lbs) 116.6 lbs  RMR 2553    ASK: We discussed the diagnosis of obesity with Margarita Rana today and Ekansh agreed to give [name] permission to discuss obesity behavioral modification therapy today.  ASSESS: Tor has the diagnosis of obesity and his BMI today is 34.58 Jakaylen is in the action stage of change   ADVISE: Lyfe was educated on the multiple health risks of obesity as well as the benefit of weight loss to improve his health. He was advised of the need for long term treatment and the importance of  lifestyle modifications to improve his current health and to decrease his risk of future health problems.  AGREE: Multiple dietary modification options and treatment options were discussed and  Aj agreed to follow the recommendations documented in the above note.  ARRANGE: Rueger was educated on the importance of frequent visits to treat obesity as outlined per CMS and USPSTF guidelines and agreed to schedule his next follow up appointment today.  Corey Skains, am acting as transcriptionist for Abby Potash, PA-C I, Abby Potash, PA-C have reviewed above note and agree with its content

## 2019-10-23 DIAGNOSIS — M5416 Radiculopathy, lumbar region: Secondary | ICD-10-CM | POA: Diagnosis not present

## 2019-10-23 DIAGNOSIS — M545 Low back pain: Secondary | ICD-10-CM | POA: Diagnosis not present

## 2019-10-23 DIAGNOSIS — M6281 Muscle weakness (generalized): Secondary | ICD-10-CM | POA: Diagnosis not present

## 2019-10-25 DIAGNOSIS — M5416 Radiculopathy, lumbar region: Secondary | ICD-10-CM | POA: Diagnosis not present

## 2019-10-25 DIAGNOSIS — M545 Low back pain: Secondary | ICD-10-CM | POA: Diagnosis not present

## 2019-10-25 DIAGNOSIS — M6281 Muscle weakness (generalized): Secondary | ICD-10-CM | POA: Diagnosis not present

## 2019-10-27 ENCOUNTER — Encounter (INDEPENDENT_AMBULATORY_CARE_PROVIDER_SITE_OTHER): Payer: Self-pay | Admitting: Physician Assistant

## 2019-10-30 DIAGNOSIS — M6281 Muscle weakness (generalized): Secondary | ICD-10-CM | POA: Diagnosis not present

## 2019-10-30 DIAGNOSIS — M5416 Radiculopathy, lumbar region: Secondary | ICD-10-CM | POA: Diagnosis not present

## 2019-10-30 DIAGNOSIS — M545 Low back pain: Secondary | ICD-10-CM | POA: Diagnosis not present

## 2019-10-30 NOTE — Telephone Encounter (Signed)
Please review

## 2019-10-31 DIAGNOSIS — R011 Cardiac murmur, unspecified: Secondary | ICD-10-CM

## 2019-10-31 HISTORY — DX: Cardiac murmur, unspecified: R01.1

## 2019-11-03 DIAGNOSIS — M6281 Muscle weakness (generalized): Secondary | ICD-10-CM | POA: Diagnosis not present

## 2019-11-03 DIAGNOSIS — M545 Low back pain: Secondary | ICD-10-CM | POA: Diagnosis not present

## 2019-11-03 DIAGNOSIS — M5416 Radiculopathy, lumbar region: Secondary | ICD-10-CM | POA: Diagnosis not present

## 2019-11-09 ENCOUNTER — Other Ambulatory Visit (INDEPENDENT_AMBULATORY_CARE_PROVIDER_SITE_OTHER): Payer: Self-pay | Admitting: Physician Assistant

## 2019-11-09 DIAGNOSIS — E119 Type 2 diabetes mellitus without complications: Secondary | ICD-10-CM

## 2019-11-09 DIAGNOSIS — Z794 Long term (current) use of insulin: Secondary | ICD-10-CM

## 2019-11-09 MED ORDER — BD PEN NEEDLE NANO U/F 32G X 4 MM MISC: 0 refills | Status: DC

## 2019-11-10 ENCOUNTER — Encounter: Payer: Self-pay | Admitting: Family Medicine

## 2019-11-10 ENCOUNTER — Encounter (INDEPENDENT_AMBULATORY_CARE_PROVIDER_SITE_OTHER): Payer: Self-pay | Admitting: Physician Assistant

## 2019-11-13 ENCOUNTER — Other Ambulatory Visit: Payer: Self-pay

## 2019-11-13 MED ORDER — FINASTERIDE 5 MG PO TABS: 5.0000 mg | ORAL_TABLET | Freq: Every day | ORAL | 0 refills | Status: DC

## 2019-11-14 ENCOUNTER — Ambulatory Visit (INDEPENDENT_AMBULATORY_CARE_PROVIDER_SITE_OTHER): Payer: Medicare Other | Admitting: Physician Assistant

## 2019-11-14 ENCOUNTER — Encounter (INDEPENDENT_AMBULATORY_CARE_PROVIDER_SITE_OTHER): Payer: Self-pay | Admitting: Physician Assistant

## 2019-11-14 ENCOUNTER — Other Ambulatory Visit: Payer: Self-pay

## 2019-11-14 VITALS — BP 126/66 | HR 77 | Temp 97.9°F | Ht 70.0 in | Wt 234.0 lb

## 2019-11-14 DIAGNOSIS — Z794 Long term (current) use of insulin: Secondary | ICD-10-CM | POA: Diagnosis not present

## 2019-11-14 DIAGNOSIS — Z6833 Body mass index (BMI) 33.0-33.9, adult: Secondary | ICD-10-CM | POA: Diagnosis not present

## 2019-11-14 DIAGNOSIS — E669 Obesity, unspecified: Secondary | ICD-10-CM | POA: Diagnosis not present

## 2019-11-14 DIAGNOSIS — E119 Type 2 diabetes mellitus without complications: Secondary | ICD-10-CM | POA: Diagnosis not present

## 2019-11-14 DIAGNOSIS — F3289 Other specified depressive episodes: Secondary | ICD-10-CM

## 2019-11-14 MED ORDER — BUPROPION HCL ER (SR) 200 MG PO TB12: 200.0000 mg | ORAL_TABLET | Freq: Every day | ORAL | 0 refills | Status: DC

## 2019-11-14 MED ORDER — LIRAGLUTIDE 18 MG/3ML ~~LOC~~ SOPN: 1.8000 mg | PEN_INJECTOR | Freq: Every morning | SUBCUTANEOUS | 0 refills | Status: DC

## 2019-11-16 NOTE — Progress Notes (Signed)
Office: [phone]  /  Fax: [phone]   HPI:  Chief Complaint: OBESITY Lorenz is here to discuss his progress with his obesity treatment plan. He is on the Category 4 plan and states he is following his eating plan approximately 90 % of the time. He states he is doing physical therapy 45 minutes 2 times per week.  Davud states that he is following the plan much more closely, though there are still times he overeats his snack calories.  Diabetes II Shoaib has a diagnosis of diabetes type II. His last A1c was at 6.1 (08/22/19). He is on Victoza and Metformin. Jadus has no nausea, vomiting, diarrhea or hypoglycemia..     Depression with emotional eating behaviors Demitrios struggles with emotional eating and he continues to report some cravings. He is on Lexapro and Bupropion. Evrett has no suicidal or homicidal ideations.    Today's visit was # 7 Starting weight: 272 lbs Starting date: 02/14/2018 Today's weight : 234 lbs Today's date: 11/15/2019 Total lbs lost to date: 65 Total lbs lost since last in-office visit: 7  ASSESSMENT AND PLAN:  Type 2 diabetes mellitus without complication, with long-term current use of insulin ([place]) - Plan: liraglutide (VICTOZA) 18 MG/3ML SOPN  Other depression, with emotional eating  - Plan: buPROPion (WELLBUTRIN SR) 200 MG 12 hr tablet  Class 1 obesity with serious comorbidity and body mass index (BMI) of 33.0 to 33.9 in adult, unspecified obesity type  PLAN:  Diabetes II Brooker has been given diabetes education by myself today. Good blood sugar control is important to decrease the likelihood of diabetic complications such as nephropathy, neuropathy, limb loss, blindness, coronary artery disease, and death. Intensive lifestyle modification including diet, exercise and weight loss were discussed as the first line treatment for diabetes. Garson agreed to continue Victoza 1.8 mg sub Q daily #3 pens with no refills and follow up as  directed.  Emotional Eating Behaviors (other depression) Behavior modification techniques were discussed today to help Durwood deal with his emotional/non-hunger eating behaviors. Bawi agrees to continue Bupropion 200 mg daily #30 with no refills and follow up as directed. We will continue to follow and monitor his progress.  Obesity Juanluis is currently in the action stage of change. As such, his goal is to continue with weight loss efforts He has agreed to follow the Category 4 plan Kekoa has been instructed to work up to a goal of 150 minutes of combined cardio and strengthening exercise per week for weight loss and overall health benefits. We discussed the following Behavioral Modification Strategies today: work on meal planning and easy cooking plans and ways to avoid night time snacking  Pratyush has agreed to follow up with our clinic in 3 to 4 weeks. He was informed of the importance of frequent follow up visits to maximize his success with intensive lifestyle modifications for his multiple health conditions.  ALLERGIES: Allergies  Allergen Reactions  . Simvastatin Other (See Comments)    myalgias  . Antihistamines, Diphenhydramine-Type Other (See Comments)    depression  . Codeine Nausea Only  . Penicillins Hives  . Sulfa Antibiotics Hives    MEDICATIONS: Current Outpatient Medications on File Prior to Visit  Medication Sig Dispense Refill  . aspirin 325 MG tablet Take 325 mg by mouth daily.      [name] atorvastatin (LIPITOR) 80 MG tablet TAKE 1 TABLET BY MOUTH EVERY DAY 90 tablet 1  . Choline Fenofibrate (FENOFIBRIC ACID) 135 MG CPDR TAKE ONE CAPSULE BY MOUTH  EVERY DAY 90 capsule 1  . escitalopram (LEXAPRO) 20 MG tablet TAKE 1 TABLET(20 MG) BY MOUTH DAILY 30 tablet 1  . finasteride (PROSCAR) 5 MG tablet Take 1 tablet (5 mg total) by mouth daily. 90 tablet 0  . fluticasone (FLONASE) 50 MCG/ACT nasal spray Place 2 sprays into both nostrils daily. 48 g 1  . gabapentin (NEURONTIN)  300 MG capsule Take 300 mg by mouth 3 (three) times daily.    [name] glucose blood test strip Use as instructed 100 each 0  . Insulin Detemir (LEVEMIR FLEXTOUCH) 100 UNIT/ML Pen 56 U SQ once every day 15 mL 11  . Insulin Pen Needle (BD PEN NEEDLE NANO U/F) 32G X 4 MM MISC USE FOR INSULIN INJECTION FOUR TIMES A DAY 100 each 0  . MELATONIN PO Take by mouth.    . metFORMIN (GLUCOPHAGE) 1000 MG tablet TAKE 1 TABLET BY MOUTH TWICE DAILY WITH A MEAL 180 tablet 1  . Naproxen Sodium (ALEVE) 220 MG CAPS Take 2 capsules by mouth at bedtime as needed.      . Omega-3 Fatty Acids (FISH OIL) 1000 MG CAPS Take 1 capsule by mouth 2 (two) times daily.    . ONE TOUCH ULTRA TEST test strip TEST THREE TIMES DAILY 100 each 11  . SALINE NASAL SPRAY NA Place into the nose daily as needed.    . tamsulosin (FLOMAX) 0.4 MG CAPS capsule TAKE 2 CAPSULES BY MOUTH AT BEDTIME 180 capsule 1  . vitamin B-12 (CYANOCOBALAMIN) 1000 MCG tablet Take 1,000 mcg by mouth daily.       No current facility-administered medications on file prior to visit.    PAST MEDICAL HISTORY: Past Medical History:  Diagnosis Date  . Allergic rhinitis, unspecified    Allergy testing via allergist 04/2017--mostly neg.  Dr. Donneta Romberg recommended referral to ENT so i did this.  . Back pain    LBP->DDD/spondylosis w/ intermittent radiulopathy-type leg pains, + bilat hip and thigh pain-->Dr. Vertell Limber to do MRI as of 05/2019  . BPH (benign prostatic hypertrophy)   . Chronic nasal congestion   . DDD (degenerative disc disease), lumbar 2010   laminectomy 01/2009.  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  . Depression    Hospitalized for suicidal ideation 1992.  Has been on lexapro since 10/2008.  . Diabetes mellitus type II    Dx'd approximately 06/2008.  No retinopathy as of 02/2017 ophth.  . Erectile dysfunction   . History of colon polyps 01/2016   Recall 3 yrs  . History of pneumonia 2008   Hospitalized  . History of  scarlet fever    age 53  . Hyperlipidemia, mixed 1993   myalgias on pravastatin  . Hypertension 2009   "marked chronic cardiomegaly" on CXR 01/2009 per old records.  . 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 Insomnia   . Keratoconus    dx'd 1973  . Memory changes   . OSA on CPAP 2004; 2019   Back on CPAP 04/2018  . Osteoarthritis of both knees    Mild plain film changes in medial compartment bilat  . Restless leg    gabapentin helpful  . Rhinitis, chronic   . 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 APPENDECTOMY  1972  . COLONOSCOPY  X 4   Most  recent was 2007 [place], [place]), with removal of polyps in the first two.  02/21/16: tubular adenoma.  Recall 3 yrs (Dr. Ardis Hughs).  . 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 ELECTROCARDIOGRAM  01/29/2009   NORMAL  . LUMBAR LAMINECTOMY  01/2009  . TONSILLECTOMY AND ADENOIDECTOMY     age 88  . TRANSTHORACIC ECHOCARDIOGRAM  02/22/2018   EF 55-60%, grd I DD  . URETHRAL DILATION     2 as a child, one as an adult  . VASECTOMY  1994  . VITRECTOMY      SOCIAL HISTORY: Social History   Tobacco Use  . Smoking status: Former Research scientist (life sciences)  . Smokeless tobacco: Never Used  Substance Use Topics  . Alcohol use: Yes    Comment: social, 3-4 drinks month  . Drug use: No    FAMILY HISTORY: Family History  Problem Relation Age of Onset  . Heart disease Mother   . Hyperlipidemia Mother   . Stroke Mother   . Diabetes Mother   . Depression Mother   . Alzheimer's disease Mother   . Stroke Father   . Hyperlipidemia Father   . Heart disease Father   . Diabetes Father   . Hypertension Father   . Obesity Father   . Alcohol abuse Sister   . Depression Sister     ROS: Review of Systems  Constitutional: Positive for weight loss.  Gastrointestinal: Negative for diarrhea, nausea and vomiting.    Endo/Heme/Allergies:       Negative for hypoglycemia Positive for cravings  Psychiatric/Behavioral: Positive for depression. Negative for suicidal ideas.    PHYSICAL EXAM: Blood pressure 126/66, pulse 77, temperature 97.9 F (36.6 C), temperature source Oral, height 5\' 10"  (1.778 m), weight 234 lb (106.1 kg), SpO2 96 %. Body mass index is 33.58 kg/m. Physical Exam Vitals reviewed.  Constitutional:      General: He is not in acute distress.    Appearance: Normal appearance. He is well-developed. He is obese.  Cardiovascular:     Rate and Rhythm: Normal rate.  Pulmonary:     Effort: Pulmonary effort is normal.  Musculoskeletal:        General: Normal range of motion.  Skin:    General: Skin is warm and dry.  Neurological:     Mental Status: He is alert and oriented to person, place, and time.  Psychiatric:        Mood and Affect: Mood normal.        Behavior: Behavior normal.        Thought Content: Thought content does not include homicidal or suicidal ideation.     RECENT LABS AND TESTS: BMET    Component Value Date/Time   NA 141 08/22/2019 0935   K 4.2 08/22/2019 0935   CL 104 08/22/2019 0935   CO2 23 08/22/2019 0935   GLUCOSE 105 (H) 08/22/2019 0935   GLUCOSE 113 (H) 08/10/2018 1033   BUN 24 08/22/2019 0935   CREATININE 0.92 08/22/2019 0935   CREATININE 0.94 01/18/2017 0832   CALCIUM 9.6 08/22/2019 0935   GFRNONAA 86 08/22/2019 0935   GFRAA 100 08/22/2019 0935   Lab Results  Component Value Date   HGBA1C 6.1 (H) 08/22/2019   HGBA1C 6.4 05/19/2019   HGBA1C 6.1 (H) 02/14/2019   HGBA1C 5.8 (A) 11/14/2018   HGBA1C 6.3 08/10/2018   No results found for: INSULIN CBC    Component Value Date/Time   WBC 7.2 02/14/2018 0848   WBC 6.2 05/21/2014 1541  RBC 4.63 02/14/2018 0848   RBC 4.45 05/21/2014 1541   HGB 13.4 02/14/2018 0848   HCT 39.3 02/14/2018 0848   PLT 211.0 05/21/2014 1541   MCV 85 02/14/2018 0848   MCH 28.9 02/14/2018 0848   MCHC 34.1  02/14/2018 0848   MCHC 33.3 05/21/2014 1541   RDW 14.1 02/14/2018 0848   LYMPHSABS 2.5 02/14/2018 0848   MONOABS 0.4 05/21/2014 1541   EOSABS 0.2 02/14/2018 0848   BASOSABS 0.0 02/14/2018 0848   Iron/TIBC/Ferritin/ %Sat No results found for: IRON, TIBC, FERRITIN, IRONPCTSAT Lipid Panel     Component Value Date/Time   CHOL 127 08/22/2019 0935   TRIG 82 08/22/2019 0935   HDL 44 08/22/2019 0935   CHOLHDL 3 08/10/2018 1033   VLDL 18.2 08/10/2018 1033   LDLCALC 67 08/22/2019 0935   LDLDIRECT 112.0 07/31/2016 1116   Hepatic Function Panel     Component Value Date/Time   PROT 6.5 08/22/2019 0935   ALBUMIN 4.3 08/22/2019 0935   AST 11 08/22/2019 0935   ALT 19 08/22/2019 0935   ALKPHOS 36 (L) 08/22/2019 0935   BILITOT 0.4 08/22/2019 0935      Component Value Date/Time   TSH 2.030 02/14/2018 0848   TSH 1.46 05/21/2014 1541   TSH 2.50 05/10/2013 0832     OBESITY BEHAVIORAL INTERVENTION VISIT DOCUMENTATION FOR INSURANCE (~15 minutes)    ASK: We discussed the diagnosis of obesity with Margarita Rana today and Amritpal agreed to give [name] permission to discuss obesity behavioral modification therapy today.  ASSESS: Jai has the diagnosis of obesity and his BMI today is 33.58 Carthel is in the action stage of change   ADVISE: Johnphilip was educated on the multiple health risks of obesity as well as the benefit of weight loss to improve his health. He was advised of the need for long term treatment and the importance of lifestyle modifications to improve his current health and to decrease his risk of future health problems.  AGREE: Multiple dietary modification options and treatment options were discussed and  Gaberial agreed to follow the recommendations documented in the above note.  ARRANGE: Rakeen was educated on the importance of frequent visits to treat obesity as outlined per CMS and USPSTF guidelines and agreed to schedule his next follow up appointment today.   Corey Skains, am acting as transcriptionist for Abby Potash, PA-C I, Abby Potash, PA-C have reviewed above note and agree with its content

## 2019-11-17 ENCOUNTER — Ambulatory Visit (INDEPENDENT_AMBULATORY_CARE_PROVIDER_SITE_OTHER): Payer: Medicare Other | Admitting: Family Medicine

## 2019-11-17 ENCOUNTER — Other Ambulatory Visit: Payer: Self-pay

## 2019-11-17 ENCOUNTER — Encounter: Payer: Self-pay | Admitting: Family Medicine

## 2019-11-17 VITALS — BP 122/76 | HR 79 | Temp 98.2°F | Resp 16 | Ht 70.0 in | Wt 243.4 lb

## 2019-11-17 DIAGNOSIS — I10 Essential (primary) hypertension: Secondary | ICD-10-CM | POA: Diagnosis not present

## 2019-11-17 DIAGNOSIS — E7800 Pure hypercholesterolemia, unspecified: Secondary | ICD-10-CM

## 2019-11-17 DIAGNOSIS — E119 Type 2 diabetes mellitus without complications: Secondary | ICD-10-CM

## 2019-11-17 DIAGNOSIS — Z23 Encounter for immunization: Secondary | ICD-10-CM | POA: Diagnosis not present

## 2019-11-17 DIAGNOSIS — R413 Other amnesia: Secondary | ICD-10-CM

## 2019-11-17 DIAGNOSIS — Z0000 Encounter for general adult medical examination without abnormal findings: Secondary | ICD-10-CM

## 2019-11-17 DIAGNOSIS — R011 Cardiac murmur, unspecified: Secondary | ICD-10-CM

## 2019-11-17 NOTE — Progress Notes (Addendum)
OFFICE VISIT  11/17/2019   CC:  Chief Complaint  Patient presents with  . Follow-up    RCI, pt is not fasting    HPI:    Patient is a 68 y.o. Caucasian male who presents for 6 mo f/u DM, HTN, HLD. He continues to be followed regularly by nonsurgical bariatric clinic for wt loss mgmt.  A/P as of last visit with me: "1) DM 2: doing great. Diab eye exam UTD, will get record. No changes in med regimen today. HbA1c -future.  2) HTN: The current medical regimen is effective;  continue present plan and medications. Hist of this but bp actually low normal consistently OFF med. Last lytes/cr great 3 mo ago.  3) HLD: tolerating statin. Recent lipid panel and hepatic panel great 3 mo ago.  4) chronic LBP: undergoing eval with Dr. Vertell Limber in neurosurgery. Starting gabapentin any day now. MRI scheduled for next week.  5) Due for prostate ca screening: PSA--future. Pneumovax 23 needed-will give him this at an upcoming in-office follow up visit."  Notes a sweet smell of urine, strong.  Pale yellow color. Couple weeks now. NO dysuria, urgency, or frequency.  Rare straining.  Interim hx:  LBP: Low back: Dr. Vertell Limber has him on 2nd round of PT--about 50%, but only temporary improvement. Right now surgery is not recommended.  Has f/u with Dr. Vertell Limber 12/15/19.  Pertinent imaging: MRI Lumbar w/out contrast 05/25/19: IMPRESSION: 1. L4-5 biforaminal compression due to disc bulge and hypertrophic facets. There is gaping facet joints and interspinous bursa, question instability at this postoperative level. Spinal stenosis is moderate to advanced. 2. L5-S1 biforaminal impingement due to disc bulging. 3. L3-4 moderate bilateral foraminal narrowing.  DM: "very good".  The last week or so 104-114 range, prior to that he was in the 90s--all fastings.  HTN: not monitoring at home.  At bariatric clinic weigh-ins q4 wks always <130/80.  HLD: tolerating atorva 80mg  qd.  ROS: no CP, no SOB, no  wheezing, no cough, no dizziness, no HAs, no rashes, no melena/hematochezia.  No polyuria or polydipsia.  No myalgias or arthralgias. Says his memory problems continue, not improved since getting his sleep study and getting on CPAP. Meds possibly associated: gabapentin started 05/2019.  His neuro eval for memory probs was about 1 yr ago.  Past Medical History:  Diagnosis Date  . Allergic rhinitis, unspecified    Allergy testing via allergist 04/2017--mostly neg.  Dr. Donneta Romberg recommended referral to ENT so i did this.  . Back pain    LBP->DDD/spondylosis w/ intermittent radiulopathy-type leg pains, + bilat hip and thigh pain-->Dr. Vertell Limber to do MRI as of 05/2019  . BPH (benign prostatic hypertrophy)   . Chronic nasal congestion   . DDD (degenerative disc disease), lumbar 2010   laminectomy 01/2009.  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  . Depression    Hospitalized for suicidal ideation 1992.  Has been on lexapro since 10/2008.  . Diabetes mellitus type II    Dx'd approximately 06/2008.  No retinopathy as of 02/2017 ophth.  . Erectile dysfunction   . History of colon polyps 01/2016   Recall 3 yrs  . History of pneumonia 2008   Hospitalized  . History of scarlet fever    age 69  . Hyperlipidemia, mixed 1993   myalgias on pravastatin  . Hypertension 2009   "marked chronic cardiomegaly" on CXR 01/2009 per old records.  . 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  Insomnia   . Keratoconus    dx'd 1973  . Memory changes   . OSA on CPAP 2004; 2019   Back on CPAP 04/2018  . Osteoarthritis of both knees    Mild plain film changes in medial compartment bilat  . Restless leg    gabapentin helpful  . Rhinitis, chronic   . 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 APPENDECTOMY  1972  . COLONOSCOPY  X 4   Most recent was 2007 [place],  [place]), with removal of polyps in the first two.  02/21/16: tubular adenoma.  Recall 3 yrs (Dr. Ardis Hughs).  . 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 ELECTROCARDIOGRAM  01/29/2009   NORMAL  . LUMBAR LAMINECTOMY  01/2009  . TONSILLECTOMY AND ADENOIDECTOMY     age 82  . TRANSTHORACIC ECHOCARDIOGRAM  02/22/2018   EF 55-60%, grd I DD  . URETHRAL DILATION     2 as a child, one as an adult  . VASECTOMY  1994  . VITRECTOMY      Outpatient Medications Prior to Visit  Medication Sig Dispense Refill  . aspirin 325 MG tablet Take 325 mg by mouth daily.      [name] atorvastatin (LIPITOR) 80 MG tablet TAKE 1 TABLET BY MOUTH EVERY DAY 90 tablet 1  . buPROPion (WELLBUTRIN SR) 200 MG 12 hr tablet Take 1 tablet (200 mg total) by mouth daily. 30 tablet 0  . Choline Fenofibrate (FENOFIBRIC ACID) 135 MG CPDR TAKE ONE CAPSULE BY MOUTH EVERY DAY 90 capsule 1  . escitalopram (LEXAPRO) 20 MG tablet TAKE 1 TABLET(20 MG) BY MOUTH DAILY 30 tablet 1  . finasteride (PROSCAR) 5 MG tablet Take 1 tablet (5 mg total) by mouth daily. 90 tablet 0  . gabapentin (NEURONTIN) 300 MG capsule Take 300 mg by mouth 3 (three) times daily.    [name] glucose blood test strip Use as instructed 100 each 0  . Insulin Detemir (LEVEMIR FLEXTOUCH) 100 UNIT/ML Pen 56 U SQ once every day 15 mL 11  . Insulin Pen Needle (BD PEN NEEDLE NANO U/F) 32G X 4 MM MISC USE FOR INSULIN INJECTION FOUR TIMES A DAY 100 each 0  . liraglutide (VICTOZA) 18 MG/3ML SOPN Inject 0.3 mLs (1.8 mg total) into the skin every morning. 3 pen 0  . MELATONIN PO Take by mouth.    . metFORMIN (GLUCOPHAGE) 1000 MG tablet TAKE 1 TABLET BY MOUTH TWICE DAILY WITH A MEAL 180 tablet 1  . Naproxen Sodium (ALEVE) 220 MG CAPS Take 2 capsules by mouth at bedtime as needed.      . Omega-3 Fatty Acids (FISH OIL) 1000 MG CAPS Take 1 capsule by mouth 2 (two) times daily.    . ONE TOUCH ULTRA TEST test strip TEST THREE TIMES DAILY 100 each 11  . SALINE NASAL SPRAY NA Place into the nose daily as  needed.    . tamsulosin (FLOMAX) 0.4 MG CAPS capsule TAKE 2 CAPSULES BY MOUTH AT BEDTIME 180 capsule 1  . vitamin B-12 (CYANOCOBALAMIN) 1000 MCG tablet Take 2,000 mcg by mouth daily.     . fluticasone (FLONASE) 50 MCG/ACT nasal spray Place 2 sprays into both nostrils daily. (Patient not taking: Reported on 11/17/2019) 48 g 1   No facility-administered medications prior to visit.    Allergies  Allergen Reactions  . Simvastatin Other (See  Comments)    myalgias  . Antihistamines, Diphenhydramine-Type Other (See Comments)    depression  . Codeine Nausea Only  . Penicillins Hives  . Sulfa Antibiotics Hives    ROS As per HPI  PE: Blood pressure 122/76, pulse 79, temperature 98.2 F (36.8 C), temperature source Temporal, resp. rate 16, height 5\' 10"  (1.778 m), weight 243 lb 6.4 oz (110.4 kg), SpO2 96 %. Body mass index is 34.92 kg/m.  Gen: Alert, well appearing.  Patient is oriented to person, place, time, and situation. AFFECT: pleasant, lucid thought and speech. CV: RRR, 2/6 systolic murmur, no diastolic murmur. No r/g.   LUNGS: CTA bilat, nonlabored resps, good aeration in all lung fields. EXT: no clubbing or cyanosis.  no edema.     LABS:  Lab Results  Component Value Date   TSH 2.030 02/14/2018  No results found for: VITAMINB12  Lab Results  Component Value Date   WBC 7.2 02/14/2018   HGB 13.4 02/14/2018   HCT 39.3 02/14/2018   MCV 85 02/14/2018   PLT 211.0 05/21/2014   Lab Results  Component Value Date   CREATININE 0.92 08/22/2019   BUN 24 08/22/2019   NA 141 08/22/2019   K 4.2 08/22/2019   CL 104 08/22/2019   CO2 23 08/22/2019   Lab Results  Component Value Date   ALT 19 08/22/2019   AST 11 08/22/2019   ALKPHOS 36 (L) 08/22/2019   BILITOT 0.4 08/22/2019   Lab Results  Component Value Date   CHOL 127 08/22/2019   Lab Results  Component Value Date   HDL 44 08/22/2019   Lab Results  Component Value Date   LDLCALC 67 08/22/2019   Lab Results   Component Value Date   TRIG 82 08/22/2019   Lab Results  Component Value Date   CHOLHDL 3 08/10/2018   Lab Results  Component Value Date   PSA 0.04 (L) 05/19/2019   PSA 0.04 (L) 05/09/2018   PSA 0.03 (L) 07/04/2015   Lab Results  Component Value Date   HGBA1C 6.1 (H) 08/22/2019    IMPRESSION AND PLAN:  1) DM 2, control sounds very good. We'll repeat A1c and other routine labs at next f/u in 6 mo. Urine microalb/cr next visit + UA eval recent c/o abnl urine smell.  2) HTN: The current medical regimen is effective;  continue present plan and medications. LABS next f/u 6 mo.  3) HLD: tolerating statin.  FLP and AST/ALT excellent 3 mo ago.  4) Systolic heart murmur: soft, pt w/out any symptoms. Echo was good 01/2018. Observe for any symptoms and follow with periodic exams to see if it is getting more prominent. Pt expressed understanding and agreement with this plan. Signs/symptoms to call or return for were reviewed and pt expressed understanding.  5) Preventative health care: Pneumovax 23 today.  Flu and shingrix UTD. No labs needed today.  6) Memory problems: what he describes to me as examples seem pretty well wnl but he is concerned. I think the next best step is neuropsych testing. Will arrange this.  An After Visit Summary was printed and given to the patient.  FOLLOW UP: Return in about 6 months (around 05/17/2020).  Signed:  Crissie Sickles, MD           11/17/2019

## 2019-11-20 ENCOUNTER — Other Ambulatory Visit: Payer: Self-pay | Admitting: Family Medicine

## 2019-11-20 ENCOUNTER — Encounter: Payer: Self-pay | Admitting: Neurology

## 2019-11-20 DIAGNOSIS — R413 Other amnesia: Secondary | ICD-10-CM

## 2019-12-04 DIAGNOSIS — M6281 Muscle weakness (generalized): Secondary | ICD-10-CM | POA: Diagnosis not present

## 2019-12-04 DIAGNOSIS — M5416 Radiculopathy, lumbar region: Secondary | ICD-10-CM | POA: Diagnosis not present

## 2019-12-04 DIAGNOSIS — M545 Low back pain: Secondary | ICD-10-CM | POA: Diagnosis not present

## 2019-12-11 ENCOUNTER — Encounter: Payer: Self-pay | Admitting: Family Medicine

## 2019-12-12 ENCOUNTER — Encounter (INDEPENDENT_AMBULATORY_CARE_PROVIDER_SITE_OTHER): Payer: Self-pay | Admitting: Physician Assistant

## 2019-12-12 ENCOUNTER — Other Ambulatory Visit: Payer: Self-pay

## 2019-12-12 ENCOUNTER — Ambulatory Visit (INDEPENDENT_AMBULATORY_CARE_PROVIDER_SITE_OTHER): Payer: Medicare Other | Admitting: Physician Assistant

## 2019-12-12 VITALS — BP 126/70 | HR 75 | Temp 98.1°F | Ht 70.0 in | Wt 233.0 lb

## 2019-12-12 DIAGNOSIS — E669 Obesity, unspecified: Secondary | ICD-10-CM

## 2019-12-12 DIAGNOSIS — E559 Vitamin D deficiency, unspecified: Secondary | ICD-10-CM

## 2019-12-12 DIAGNOSIS — E119 Type 2 diabetes mellitus without complications: Secondary | ICD-10-CM

## 2019-12-12 DIAGNOSIS — F3289 Other specified depressive episodes: Secondary | ICD-10-CM | POA: Diagnosis not present

## 2019-12-12 DIAGNOSIS — E7849 Other hyperlipidemia: Secondary | ICD-10-CM

## 2019-12-12 DIAGNOSIS — Z6833 Body mass index (BMI) 33.0-33.9, adult: Secondary | ICD-10-CM

## 2019-12-12 MED ORDER — BUPROPION HCL ER (SR) 150 MG PO TB12: 150.0000 mg | ORAL_TABLET | Freq: Every day | ORAL | 0 refills | Status: DC

## 2019-12-12 NOTE — Progress Notes (Signed)
Chief Complaint:   OBESITY [name] [name] is here to discuss his progress with his obesity treatment plan along with follow-up of his obesity related diagnoses. [name] [name] is on the Category 4 Plan and states he is following his eating plan approximately 75% of the time. [name] [name] states he is doing physical therapy 45 to 60 minutes 2 times per week.  Today's visit was #: 30 Starting weight: 272 lbs Starting date: 02/14/18 Today's weight: 233 lbs Today's date: 12/12/2019 Total lbs lost to date: 39 Total lbs lost since last in-office visit: 1  Interim History: [name] [name] reports that he indulged some over the holidays and is happy to be down a pound today. He reports a lot of cravings recently, possible due to some anxiety regarding a kitchen renovation.  Subjective:   1. Other hyperlipidemia [name] [name] has hyperlipidemia and is on atorvastatin. His last lipid panel was normal. [name] [name] is also on fenofibrate and Omega 3. He has been trying to improve his cholesterol levels with intensive lifestyle modification including a low saturated fat diet, exercise and weight loss. [name] [name] is due for labs today.  Lab Results  Component Value Date   ALT 19 08/22/2019   AST 11 08/22/2019   ALKPHOS 36 (L) 08/22/2019   BILITOT 0.4 08/22/2019   Lab Results  Component Value Date   CHOL 127 08/22/2019   HDL 44 08/22/2019   LDLCALC 67 08/22/2019   LDLDIRECT 112.0 07/31/2016   TRIG 82 08/22/2019   CHOLHDL 3 08/10/2018    2. Vitamin D deficiency [name] [name]' Vitamin D level was 57.1 on 08/22/19. He is currently taking OTC vit D, but is unsure of the dosage. He is due for labs.  3. Type 2 diabetes mellitus without complication, without long-term current use of insulin (HCC) Medications reviewed. Home glucose monitoring: fasting values average 90 to 110, further diabetic ROS:  no hypoglycemia. [name] [name] is taking Victoza, metformin, and levemir. He is due for labs today.  Lab Results  Component Value Date   HGBA1C 6.1  (H) 08/22/2019   HGBA1C 6.4 05/19/2019   HGBA1C 6.1 (H) 02/14/2019   Lab Results  Component Value Date   MICROALBUR <0.7 07/04/2015   LDLCALC 67 08/22/2019   CREATININE 0.92 08/22/2019   4. Other depression, with emotional eating  [name] [name] is on bupropion and he feels that it is no longer helping him with cravings. His depression is worsening, but his mood is stable. He is struggling with emotional eating and using food for comfort to the extent that it is negatively impacting his health. He often snacks when he is not hungry. [name] [name] sometimes feels he is out of control and then feels guilty that he made poor food choices. He has been working on behavior modification techniques to help reduce his emotional eating and has been somewhat successful. He shows no sign of suicidal or homicidal ideations.  Assessment/Plan:   1. Other hyperlipidemia Cardiovascular risk and specific lipid/LDL goals reviewed.  We discussed several lifestyle modifications today and [name] [name] will continue to taking his medications, work on diet, exercise, and weight loss efforts. Orders and follow up as documented in patient record.   Counseling Intensive lifestyle modifications are the first line treatment for this issue. . Dietary changes: Increase soluble fiber. Decrease simple carbohydrates. . Exercise changes: Moderate to vigorous-intensity aerobic activity 150 minutes per week if tolerated. . Lipid-lowering medications: see documented in medical record.  - Comprehensive Metabolic Panel (CMET) - HgB A1c - Lipid Panel With LDL/HDL Ratio  2. Vitamin D deficiency Low Vitamin D level contributes to fatigue and are associated with obesity, breast, and colon cancer. He agrees to continue to take OTC Vitamin D and will follow-up for routine testing of vitamin D, at least 2-3 times per year to avoid over-replacement.  - Vitamin D (25 hydroxy)  3. Type 2 diabetes mellitus without complication, without long-term current  use of insulin (HCC) [name] [name] has been given diabetes education by myself today. Good blood sugar control is important to decrease the likelihood of diabetic complications such as nephropathy, neuropathy, limb loss, blindness, coronary artery disease, and death. Intensive lifestyle modification including diet, exercise and weight loss were discussed as the first line treatment for diabetes. [name] [name] agrees to continue with his plan.  - Comprehensive Metabolic Panel (CMET) - HgB A1c - Urine Microalbumin w/creat. ratio  4. Other depression, with emotional eating  Behavior modification techniques were discussed today to help [name] [name] deal with his emotional/non-hunger eating behaviors. Bupropion was increased to 150 mg BID #60 with no refills. Orders and follow up as documented in patient record.   - buPROPion (WELLBUTRIN SR) 150 MG 12 hr tablet; Take 1 tablet (150 mg total) BID.  Dispense: 60 tablet; Refill: 0  5. Class 1 obesity with serious comorbidity and body mass index (BMI) of 33.0 to 33.9 in adult, unspecified obesity type  [name] [name] is currently in the action stage of change. As such, his goal is to continue with weight loss efforts. He has agreed to on the Category 4 Plan.   We discussed the following exercise goals today: Older adults should follow the adult guidelines. When older adults cannot meet the adult guidelines, they should be as physically active as their abilities and conditions will allow.  Older adults should do exercises that maintain or improve balance if they are at risk of falling.   We discussed the following behavioral modification strategies today: keeping healthy foods in the home and planning for success.  [name] [name] has agreed to follow-up with our clinic in 4 weeks. He was informed of the importance of frequent follow-up visits to maximize his success with intensive lifestyle modifications for his multiple health conditions.   [name] [name] was informed we would discuss his  lab results at his next visit unless there is a critical issue that needs to be addressed sooner. [name] [name] agreed to keep his next visit at the agreed upon time to discuss these results.  Objective:   Blood pressure 126/70, pulse 75, temperature 98.1 F (36.7 C), temperature source Oral, height 5\' 10"  (1.778 m), weight 233 lb (105.7 kg), SpO2 94 %. Body mass index is 33.43 kg/m.  General: Cooperative, alert, well developed, in no acute distress. HEENT: Conjunctivae and lids unremarkable. Neck: No thyromegaly.  Cardiovascular: Regular rhythm.  Lungs: Normal work of breathing. Extremities: No edema.  Neurologic: No focal deficits.   Lab Results  Component Value Date   CREATININE 0.92 08/22/2019   BUN 24 08/22/2019   NA 141 08/22/2019   K 4.2 08/22/2019   CL 104 08/22/2019   CO2 23 08/22/2019   Lab Results  Component Value Date   ALT 19 08/22/2019   AST 11 08/22/2019   ALKPHOS 36 (L) 08/22/2019   BILITOT 0.4 08/22/2019   Lab Results  Component Value Date   HGBA1C 6.1 (H) 08/22/2019   HGBA1C 6.4 05/19/2019   HGBA1C 6.1 (H) 02/14/2019   HGBA1C 5.8 (A) 11/14/2018   HGBA1C 6.3 08/10/2018   No results found for: INSULIN Lab Results  Component Value Date   TSH 2.030 02/14/2018   Lab Results  Component Value Date   CHOL 127 08/22/2019   HDL 44 08/22/2019   LDLCALC 67 08/22/2019   LDLDIRECT 112.0 07/31/2016   TRIG 82 08/22/2019   CHOLHDL 3 08/10/2018   Lab Results  Component Value Date   WBC 7.2 02/14/2018   HGB 13.4 02/14/2018   HCT 39.3 02/14/2018   MCV 85 02/14/2018   PLT 211.0 05/21/2014   No results found for: IRON, TIBC, FERRITIN  Obesity Behavioral Intervention Documentation for Insurance:   Approximately 15 minutes were spent on the discussion below.  ASK: We discussed the diagnosis of obesity with [name] [name] today and [name] [name] agreed to give [name] permission to discuss obesity behavioral modification therapy today.  ASSESS: [name] [name] has the  diagnosis of obesity and his BMI today is 33.43. [name] [name] is in the action stage of change.   ADVISE: Shu was educated on the multiple health risks of obesity as well as the benefit of weight loss to improve his health. He was advised of the need for long term treatment and the importance of lifestyle modifications to improve his current health and to decrease his risk of future health problems.  AGREE: Multiple dietary modification options and treatment options were discussed and Chayan agreed to follow the recommendations documented in the above note.  ARRANGE: [name] [name] was educated on the importance of frequent visits to treat obesity as outlined per CMS and USPSTF guidelines and agreed to schedule his next follow up appointment today.  Attestation Statements:   Reviewed by clinician on day of visit: allergies, medications, problem list, medical history, surgical history, family history, social history, and previous encounter notes.   [name] [name], CMA, am acting as transcriptionist for Abby Potash, PA-C   I have reviewed the above documentation for accuracy and completeness, and I agree with the above. Abby Potash, PA-C

## 2019-12-12 NOTE — Telephone Encounter (Signed)
Patient takes 24 U of Levemir and 0.57mLof Victoza qd. Please advise, thanks.

## 2019-12-13 ENCOUNTER — Encounter (INDEPENDENT_AMBULATORY_CARE_PROVIDER_SITE_OTHER): Payer: Self-pay

## 2019-12-13 ENCOUNTER — Other Ambulatory Visit (INDEPENDENT_AMBULATORY_CARE_PROVIDER_SITE_OTHER): Payer: Self-pay | Admitting: Physician Assistant

## 2019-12-13 DIAGNOSIS — M5416 Radiculopathy, lumbar region: Secondary | ICD-10-CM | POA: Diagnosis not present

## 2019-12-13 DIAGNOSIS — M6281 Muscle weakness (generalized): Secondary | ICD-10-CM | POA: Diagnosis not present

## 2019-12-13 DIAGNOSIS — F3289 Other specified depressive episodes: Secondary | ICD-10-CM

## 2019-12-13 DIAGNOSIS — M545 Low back pain: Secondary | ICD-10-CM | POA: Diagnosis not present

## 2019-12-13 LAB — LIPID PANEL WITH LDL/HDL RATIO
Cholesterol, Total: 130 mg/dL (ref 100–199)
HDL: 48 mg/dL (ref >39)
LDL Chol Calc (NIH): 68 mg/dL (ref 0–99)
LDL/HDL Ratio: 1.4 ratio (ref 0.0–3.6)
Triglycerides: 69 mg/dL (ref 0–149)
VLDL Cholesterol Cal: 14 mg/dL (ref 5–40)

## 2019-12-13 LAB — COMPREHENSIVE METABOLIC PANEL
ALT: 18 IU/L (ref 0–44)
AST: 14 IU/L (ref 0–40)
Albumin/Globulin Ratio: 2.4 — ABNORMAL HIGH (ref 1.2–2.2)
Albumin: 4.5 g/dL (ref 3.8–4.8)
Alkaline Phosphatase: 34 IU/L — ABNORMAL LOW (ref 39–117)
BUN/Creatinine Ratio: 23 (ref 10–24)
BUN: 22 mg/dL (ref 8–27)
Bilirubin Total: 0.4 mg/dL (ref 0.0–1.2)
CO2: 24 mmol/L (ref 20–29)
Calcium: 9.5 mg/dL (ref 8.6–10.2)
Chloride: 101 mmol/L (ref 96–106)
Creatinine, Ser: 0.95 mg/dL (ref 0.76–1.27)
GFR calc Af Amer: 96 mL/min/{1.73_m2} (ref >59)
GFR calc non Af Amer: 83 mL/min/{1.73_m2} (ref >59)
Globulin, Total: 1.9 g/dL (ref 1.5–4.5)
Glucose: 75 mg/dL (ref 65–99)
Potassium: 4 mmol/L (ref 3.5–5.2)
Sodium: 138 mmol/L (ref 134–144)
Total Protein: 6.4 g/dL (ref 6.0–8.5)

## 2019-12-13 LAB — HEMOGLOBIN A1C
Est. average glucose Bld gHb Est-mCnc: 120 mg/dL
Hgb A1c MFr Bld: 5.8 % — ABNORMAL HIGH (ref 4.8–5.6)

## 2019-12-13 LAB — VITAMIN D 25 HYDROXY (VIT D DEFICIENCY, FRACTURES): Vit D, 25-Hydroxy: 35 ng/mL (ref 30.0–100.0)

## 2019-12-13 LAB — MICROALBUMIN / CREATININE URINE RATIO
Creatinine, Urine: 114 mg/dL
Microalb/Creat Ratio: <3 mg/g creat (ref 0–29)
Microalbumin, Urine: <3 ug/mL

## 2019-12-18 DIAGNOSIS — M545 Low back pain: Secondary | ICD-10-CM | POA: Diagnosis not present

## 2019-12-18 DIAGNOSIS — M6281 Muscle weakness (generalized): Secondary | ICD-10-CM | POA: Diagnosis not present

## 2019-12-18 DIAGNOSIS — M5416 Radiculopathy, lumbar region: Secondary | ICD-10-CM | POA: Diagnosis not present

## 2019-12-20 DIAGNOSIS — M5416 Radiculopathy, lumbar region: Secondary | ICD-10-CM | POA: Diagnosis not present

## 2019-12-20 DIAGNOSIS — M6281 Muscle weakness (generalized): Secondary | ICD-10-CM | POA: Diagnosis not present

## 2019-12-20 DIAGNOSIS — M545 Low back pain: Secondary | ICD-10-CM | POA: Diagnosis not present

## 2019-12-21 ENCOUNTER — Other Ambulatory Visit (INDEPENDENT_AMBULATORY_CARE_PROVIDER_SITE_OTHER): Payer: Self-pay | Admitting: Physician Assistant

## 2019-12-21 DIAGNOSIS — F3289 Other specified depressive episodes: Secondary | ICD-10-CM

## 2019-12-25 DIAGNOSIS — M5416 Radiculopathy, lumbar region: Secondary | ICD-10-CM | POA: Diagnosis not present

## 2019-12-25 DIAGNOSIS — M6281 Muscle weakness (generalized): Secondary | ICD-10-CM | POA: Diagnosis not present

## 2019-12-25 DIAGNOSIS — M545 Low back pain: Secondary | ICD-10-CM | POA: Diagnosis not present

## 2019-12-27 DIAGNOSIS — M545 Low back pain: Secondary | ICD-10-CM | POA: Diagnosis not present

## 2019-12-27 DIAGNOSIS — M6281 Muscle weakness (generalized): Secondary | ICD-10-CM | POA: Diagnosis not present

## 2019-12-27 DIAGNOSIS — M5416 Radiculopathy, lumbar region: Secondary | ICD-10-CM | POA: Diagnosis not present

## 2020-01-01 DIAGNOSIS — M545 Low back pain: Secondary | ICD-10-CM | POA: Diagnosis not present

## 2020-01-01 DIAGNOSIS — M6281 Muscle weakness (generalized): Secondary | ICD-10-CM | POA: Diagnosis not present

## 2020-01-01 DIAGNOSIS — M5416 Radiculopathy, lumbar region: Secondary | ICD-10-CM | POA: Diagnosis not present

## 2020-01-03 DIAGNOSIS — M545 Low back pain: Secondary | ICD-10-CM | POA: Diagnosis not present

## 2020-01-03 DIAGNOSIS — M5416 Radiculopathy, lumbar region: Secondary | ICD-10-CM | POA: Diagnosis not present

## 2020-01-03 DIAGNOSIS — M6281 Muscle weakness (generalized): Secondary | ICD-10-CM | POA: Diagnosis not present

## 2020-01-04 ENCOUNTER — Encounter: Payer: Self-pay | Admitting: Family Medicine

## 2020-01-09 ENCOUNTER — Ambulatory Visit (INDEPENDENT_AMBULATORY_CARE_PROVIDER_SITE_OTHER): Payer: Medicare Other | Admitting: Physician Assistant

## 2020-01-09 ENCOUNTER — Other Ambulatory Visit: Payer: Self-pay

## 2020-01-09 ENCOUNTER — Encounter (INDEPENDENT_AMBULATORY_CARE_PROVIDER_SITE_OTHER): Payer: Self-pay | Admitting: Physician Assistant

## 2020-01-09 VITALS — BP 109/65 | HR 82 | Temp 98.2°F | Ht 70.0 in | Wt 233.0 lb

## 2020-01-09 DIAGNOSIS — E669 Obesity, unspecified: Secondary | ICD-10-CM

## 2020-01-09 DIAGNOSIS — Z794 Long term (current) use of insulin: Secondary | ICD-10-CM

## 2020-01-09 DIAGNOSIS — Z6833 Body mass index (BMI) 33.0-33.9, adult: Secondary | ICD-10-CM

## 2020-01-09 DIAGNOSIS — E119 Type 2 diabetes mellitus without complications: Secondary | ICD-10-CM | POA: Diagnosis not present

## 2020-01-09 DIAGNOSIS — F3289 Other specified depressive episodes: Secondary | ICD-10-CM

## 2020-01-09 DIAGNOSIS — E559 Vitamin D deficiency, unspecified: Secondary | ICD-10-CM

## 2020-01-09 MED ORDER — BUPROPION HCL ER (SR) 150 MG PO TB12: 150.0000 mg | ORAL_TABLET | Freq: Every day | ORAL | 0 refills | Status: DC

## 2020-01-09 MED ORDER — VITAMIN D (ERGOCALCIFEROL) 1.25 MG (50000 UNIT) PO CAPS: 50000.0000 [IU] | ORAL_CAPSULE | ORAL | 0 refills | Status: DC

## 2020-01-09 MED ORDER — BD PEN NEEDLE NANO U/F 32G X 4 MM MISC: 0 refills | Status: DC

## 2020-01-09 NOTE — Progress Notes (Signed)
Chief Complaint:   OBESITY [name] [name] is here to discuss his progress with his obesity treatment plan along with follow-up of his obesity related diagnoses. [name] [name] is on the Category 4 Plan and states he is following his eating plan approximately 70% of the time. [name] [name] states he is doing Physical Therapy 45 minutes 2 times per week.  Today's visit was #: 37 Starting weight: 272 lbs Starting date: 02/14/2018 Today's weight: 233 lbs Today's date: 01/09/2020 Total lbs lost to date: 39  Total lbs lost since last in-office visit: 0  Interim History: [name] [name] reports snacking throughout the day and is exceeding his snack calories. He is getting an epidural injection for his back pain in 2 days.  Subjective:   Type 2 diabetes mellitus without complication, with long-term current use of insulin ([place]). [name] [name] states fasting blood sugars are in the 80's to 90's. He is on Victoza and metformin. No nausea, vomiting, or diarrhea. He reports polyphagia.  Lab Results  Component Value Date   HGBA1C 5.8 (H) 12/12/2019   HGBA1C 6.1 (H) 08/22/2019   HGBA1C 6.4 05/19/2019   Lab Results  Component Value Date   MICROALBUR <0.7 07/04/2015   LDLCALC 68 12/12/2019   CREATININE 0.95 12/12/2019   No results found for: INSULIN  Other depression, with emotional eating. [name] [name] is struggling with emotional eating and using food for comfort to the extent that it is negatively impacting his health. He has been working on behavior modification techniques to help reduce his emotional eating and has been somewhat successful. He shows no sign of suicidal or homicidal ideations. [name] [name] is on bupropion and Lexapro. He is only taking Wellbutrin once daily because he didn't realize he was supposed to be taking twice daily.  Vitamin D deficiency. Last Vitamin D level down to 35.0 on 12/12/2019. [name] [name] is on OTC Vitamin D 2,000 units daily.  Assessment/Plan:   Type 2 diabetes mellitus without complication, with  long-term current use of insulin ([place]). Good blood sugar control is important to decrease the likelihood of diabetic complications such as nephropathy, neuropathy, limb loss, blindness, coronary artery disease, and death. Intensive lifestyle modification including diet, exercise and weight loss are the first line of treatment for diabetes. [name] [name] was given a refill on insulin needles #100.  Other depression, with emotional eating. Behavior modification techniques were discussed today to help [name] [name] deal with his emotional/non-hunger eating behaviors.  Orders and follow up as documented in patient record. [name] [name] will start taking bupropion twice daily and continue with Lexapro as prescribed.  Vitamin D deficiency. Low Vitamin D level contributes to fatigue and are associated with obesity, breast, and colon cancer. He was given a refill on his Vitamin D @50 ,000 IU 1 PO weekly #4 with 0 refills and will follow-up for routine testing of Vitamin D, at least 2-3 times per year to avoid over-replacement.  Class 1 obesity with serious comorbidity and body mass index (BMI) of 33.0 to 33.9 in adult, unspecified obesity type.  [name] [name] is currently in the action stage of change. As such, his goal is to continue with weight loss efforts. He has agreed to the Category 4 Plan.   Exercise goals: Older adults should follow the adult guidelines. When older adults cannot meet the adult guidelines, they should be as physically active as their abilities and conditions will allow.   Behavioral modification strategies: increasing lean protein intake and decreasing simple carbohydrates.  [name] [name] has agreed to follow-up with our clinic in 4 weeks. He  was informed of the importance of frequent follow-up visits to maximize his success with intensive lifestyle modifications for his multiple health conditions.   Objective:   Blood pressure 109/65, pulse 82, temperature 98.2 F (36.8 C), temperature source Oral, height 5\' 10"   (1.778 m), weight 233 lb (105.7 kg), SpO2 96 %. Body mass index is 33.43 kg/m.  General: Cooperative, alert, well developed, in no acute distress. HEENT: Conjunctivae and lids unremarkable. Cardiovascular: Regular rhythm.  Lungs: Normal work of breathing. Neurologic: No focal deficits.   Lab Results  Component Value Date   CREATININE 0.95 12/12/2019   BUN 22 12/12/2019   NA 138 12/12/2019   K 4.0 12/12/2019   CL 101 12/12/2019   CO2 24 12/12/2019   Lab Results  Component Value Date   ALT 18 12/12/2019   AST 14 12/12/2019   ALKPHOS 34 (L) 12/12/2019   BILITOT 0.4 12/12/2019   Lab Results  Component Value Date   HGBA1C 5.8 (H) 12/12/2019   HGBA1C 6.1 (H) 08/22/2019   HGBA1C 6.4 05/19/2019   HGBA1C 6.1 (H) 02/14/2019   HGBA1C 5.8 (A) 11/14/2018   No results found for: INSULIN Lab Results  Component Value Date   TSH 2.030 02/14/2018   Lab Results  Component Value Date   CHOL 130 12/12/2019   HDL 48 12/12/2019   LDLCALC 68 12/12/2019   LDLDIRECT 112.0 07/31/2016   TRIG 69 12/12/2019   CHOLHDL 3 08/10/2018   Lab Results  Component Value Date   WBC 7.2 02/14/2018   HGB 13.4 02/14/2018   HCT 39.3 02/14/2018   MCV 85 02/14/2018   PLT 211.0 05/21/2014   No results found for: IRON, TIBC, FERRITIN  Obesity Behavioral Intervention Documentation for Insurance:   Approximately 15 minutes were spent on the discussion below.  ASK: We discussed the diagnosis of obesity with [name] [name] today and [place] agreed to give [name] permission to discuss obesity behavioral modification therapy today.  ASSESS: [name] [name] has the diagnosis of obesity and his BMI today is 33.5. [name] [name] is in the action stage of change.   ADVISE: [name] [name] was educated on the multiple health risks of obesity as well as the benefit of weight loss to improve his health. He was advised of the need for long term treatment and the importance of lifestyle modifications to improve his current health and to decrease his  risk of future health problems.  AGREE: Multiple dietary modification options and treatment options were discussed and [name] [name] agreed to follow the recommendations documented in the above note.  ARRANGE: [name] [name] was educated on the importance of frequent visits to treat obesity as outlined per CMS and USPSTF guidelines and agreed to schedule his next follow up appointment today.  Attestation Statements:   Reviewed by clinician on day of visit: allergies, medications, problem list, medical history, surgical history, family history, social history, and previous encounter notes.  IMichaelene Song, am acting as transcriptionist for Abby Potash, PA-C   I have reviewed the above documentation for accuracy and completeness, and I agree with the above. Abby Potash, PA-C

## 2020-01-10 DIAGNOSIS — M545 Low back pain: Secondary | ICD-10-CM | POA: Diagnosis not present

## 2020-01-10 DIAGNOSIS — M5416 Radiculopathy, lumbar region: Secondary | ICD-10-CM | POA: Diagnosis not present

## 2020-01-10 DIAGNOSIS — M6281 Muscle weakness (generalized): Secondary | ICD-10-CM | POA: Diagnosis not present

## 2020-01-11 DIAGNOSIS — Z23 Encounter for immunization: Secondary | ICD-10-CM | POA: Diagnosis not present

## 2020-01-11 DIAGNOSIS — M545 Low back pain: Secondary | ICD-10-CM | POA: Diagnosis not present

## 2020-01-11 DIAGNOSIS — M4316 Spondylolisthesis, lumbar region: Secondary | ICD-10-CM | POA: Diagnosis not present

## 2020-01-12 ENCOUNTER — Other Ambulatory Visit (INDEPENDENT_AMBULATORY_CARE_PROVIDER_SITE_OTHER): Payer: Self-pay | Admitting: Physician Assistant

## 2020-01-12 ENCOUNTER — Other Ambulatory Visit: Payer: Self-pay

## 2020-01-12 DIAGNOSIS — M5416 Radiculopathy, lumbar region: Secondary | ICD-10-CM | POA: Diagnosis not present

## 2020-01-12 DIAGNOSIS — M6281 Muscle weakness (generalized): Secondary | ICD-10-CM | POA: Diagnosis not present

## 2020-01-12 DIAGNOSIS — M545 Low back pain: Secondary | ICD-10-CM | POA: Diagnosis not present

## 2020-01-12 DIAGNOSIS — F3289 Other specified depressive episodes: Secondary | ICD-10-CM

## 2020-01-12 MED ORDER — ATORVASTATIN CALCIUM 80 MG PO TABS: 80.0000 mg | ORAL_TABLET | Freq: Every day | ORAL | 0 refills | Status: DC

## 2020-01-12 MED ORDER — METFORMIN HCL 1000 MG PO TABS: ORAL_TABLET | ORAL | 0 refills | Status: DC

## 2020-01-12 MED ORDER — TAMSULOSIN HCL 0.4 MG PO CAPS: 0.8000 mg | ORAL_CAPSULE | Freq: Every day | ORAL | 0 refills | Status: DC

## 2020-01-12 MED ORDER — FENOFIBRIC ACID 135 MG PO CPDR: 1.0000 | DELAYED_RELEASE_CAPSULE | Freq: Every day | ORAL | 0 refills | Status: DC

## 2020-01-15 DIAGNOSIS — M545 Low back pain: Secondary | ICD-10-CM | POA: Diagnosis not present

## 2020-01-15 DIAGNOSIS — M5416 Radiculopathy, lumbar region: Secondary | ICD-10-CM | POA: Diagnosis not present

## 2020-01-15 DIAGNOSIS — M6281 Muscle weakness (generalized): Secondary | ICD-10-CM | POA: Diagnosis not present

## 2020-01-17 DIAGNOSIS — M6281 Muscle weakness (generalized): Secondary | ICD-10-CM | POA: Diagnosis not present

## 2020-01-17 DIAGNOSIS — M5416 Radiculopathy, lumbar region: Secondary | ICD-10-CM | POA: Diagnosis not present

## 2020-01-17 DIAGNOSIS — M545 Low back pain: Secondary | ICD-10-CM | POA: Diagnosis not present

## 2020-01-22 DIAGNOSIS — M6281 Muscle weakness (generalized): Secondary | ICD-10-CM | POA: Diagnosis not present

## 2020-01-22 DIAGNOSIS — M545 Low back pain: Secondary | ICD-10-CM | POA: Diagnosis not present

## 2020-01-22 DIAGNOSIS — M5416 Radiculopathy, lumbar region: Secondary | ICD-10-CM | POA: Diagnosis not present

## 2020-01-23 ENCOUNTER — Other Ambulatory Visit: Payer: Self-pay

## 2020-01-23 MED ORDER — FINASTERIDE 5 MG PO TABS: 5.0000 mg | ORAL_TABLET | Freq: Every day | ORAL | 0 refills | Status: DC

## 2020-01-24 DIAGNOSIS — M545 Low back pain: Secondary | ICD-10-CM | POA: Diagnosis not present

## 2020-01-24 DIAGNOSIS — M6281 Muscle weakness (generalized): Secondary | ICD-10-CM | POA: Diagnosis not present

## 2020-01-24 DIAGNOSIS — M5416 Radiculopathy, lumbar region: Secondary | ICD-10-CM | POA: Diagnosis not present

## 2020-01-29 DIAGNOSIS — M48061 Spinal stenosis, lumbar region without neurogenic claudication: Secondary | ICD-10-CM | POA: Diagnosis not present

## 2020-01-29 DIAGNOSIS — M545 Low back pain: Secondary | ICD-10-CM | POA: Diagnosis not present

## 2020-01-29 DIAGNOSIS — M6281 Muscle weakness (generalized): Secondary | ICD-10-CM | POA: Diagnosis not present

## 2020-01-29 DIAGNOSIS — M5416 Radiculopathy, lumbar region: Secondary | ICD-10-CM | POA: Diagnosis not present

## 2020-01-29 DIAGNOSIS — M4316 Spondylolisthesis, lumbar region: Secondary | ICD-10-CM | POA: Diagnosis not present

## 2020-01-31 DIAGNOSIS — M5416 Radiculopathy, lumbar region: Secondary | ICD-10-CM | POA: Diagnosis not present

## 2020-01-31 DIAGNOSIS — M545 Low back pain: Secondary | ICD-10-CM | POA: Diagnosis not present

## 2020-01-31 DIAGNOSIS — M6281 Muscle weakness (generalized): Secondary | ICD-10-CM | POA: Diagnosis not present

## 2020-02-05 ENCOUNTER — Other Ambulatory Visit: Payer: Self-pay

## 2020-02-05 ENCOUNTER — Other Ambulatory Visit (INDEPENDENT_AMBULATORY_CARE_PROVIDER_SITE_OTHER): Payer: Medicare Other

## 2020-02-05 ENCOUNTER — Ambulatory Visit (INDEPENDENT_AMBULATORY_CARE_PROVIDER_SITE_OTHER): Payer: Medicare Other | Admitting: Neurology

## 2020-02-05 ENCOUNTER — Encounter: Payer: Self-pay | Admitting: Neurology

## 2020-02-05 VITALS — BP 133/76 | HR 77 | Ht 70.0 in | Wt 238.2 lb

## 2020-02-05 DIAGNOSIS — G3184 Mild cognitive impairment, so stated: Secondary | ICD-10-CM

## 2020-02-05 DIAGNOSIS — G4719 Other hypersomnia: Secondary | ICD-10-CM

## 2020-02-05 DIAGNOSIS — R6889 Other general symptoms and signs: Secondary | ICD-10-CM | POA: Diagnosis not present

## 2020-02-05 DIAGNOSIS — M5416 Radiculopathy, lumbar region: Secondary | ICD-10-CM | POA: Diagnosis not present

## 2020-02-05 DIAGNOSIS — M6281 Muscle weakness (generalized): Secondary | ICD-10-CM | POA: Diagnosis not present

## 2020-02-05 DIAGNOSIS — M545 Low back pain: Secondary | ICD-10-CM | POA: Diagnosis not present

## 2020-02-05 LAB — TSH: TSH: 1.25 mIU/L (ref 0.40–4.50)

## 2020-02-05 LAB — VITAMIN B12: Vitamin B-12: 1159 pg/mL — ABNORMAL HIGH (ref 200–1100)

## 2020-02-05 NOTE — Progress Notes (Signed)
NEUROLOGY CONSULTATION NOTE  [name] [name] MRN: [id number] DOB: 04-19-1951  Referring provider: Dr. Shawnie Dapper Primary care provider: Dr. Shawnie Dapper  Reason for consult:  Memory loss  Dear Dr Anitra Lauth:  Thank you for your kind referral of [name] [name] for consultation of the above symptoms. Although his history is well known to you, please allow me to reiterate it for the purpose of our medical record. The patient was accompanied to the clinic by his wife [name] who also provides collateral information. Records and images were personally reviewed where available.   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 He and his wife of 25 years started noticing changes a couple of years ago. He always used to remember names great but now has problems remembering names of neighbors, friends, actors. He is having difficulty remembering and following directions while driving, it is not unusual that he does not remember where to go. This is his wife's greatest concern as well. She is now very used to giving directions even to familiar places. He is having increasing issues with simple directions, she tells him to go left and he wants to turn right. He would not recall instructions she just said a few minutes prior. One time a few months ago he did not know where they were, he could not place himself and was totally confused where he was, which was not far from home. She notes that even calculating the time needed to leave for today's appointment, he could not recall where our building was and was confused with very simple time calculations. He does not remember where things are placed, having a hard time telling his wife where to find things in the kitchen. He loves to read books on history/politics, but for the past several months,  reading certain things are "not clicking" with him, he finishes a paragraph and has to think of what it meant. His wife reports he usually has "phenomonenal comprehension," so this is a big change for him. He forgets his medications around once a week, he swears he took them but sees the pill in the box the next day. His wife has always managed bills. They have also noticed increasing irritability with inanimate objects, he gets very angry and upset at something he drops. No paranoia or hallucinations. He is on Lexapro for sleep, and Wellbutrin started to control cravings. He used to have depression but has been really good the past 10 years overall. He was evaluated at by neurologist Dr. Rexene Alberts in 2019 and was advised to start CPAP first and see if this helped with cognitive issues. He has not noticed any difference, and feels memory has gotten worse. He sleeps much better with his CPAP, most of the time rested in the morning. He still has some daytime drowsiness, but only needs naps 1-2 times a month. No REM behavior symptoms.  He denies any headaches, diplopia, dysarthria, neck pain, bowel dysfunction. He has dizziness when making turns, no falls. He has low back pain and pain behind his thighs, currently doing PT. Nerve block helped. He has occasional numbness in his toes and fingers. He has noticed frequent urination, no incontinence. He has noticed slight reduction in sense of smell. He has occasional right arm tremors, he noticed it while doing PT this morning. He recalls having more of the tremor years ago when under  stress with his ex-wife. His mother had dementia. No history of significant head injuries. He is a social drinker (3-4 drinks a month).   Lab Results  Component Value Date   TSH 2.030 02/14/2018    PAST MEDICAL HISTORY: Past Medical History:  Diagnosis Date  . Allergic rhinitis, unspecified    Allergy testing via allergist 04/2017--mostly neg.  Dr. Donneta Romberg recommended referral to ENT  so i did this.  . Back pain    LBP->DDD/spondylosis w/ intermittent radiulopathy-type leg pains, + bilat hip and thigh pain-->Dr. Vertell Limber to do MRI as of 05/2019  . BPH (benign prostatic hypertrophy)   . Chronic nasal congestion   . DDD (degenerative disc disease), lumbar 2010   laminectomy 01/2009.  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  . Depression    Hospitalized for suicidal ideation 1992.  Has been on lexapro since 10/2008.  . Diabetes mellitus type II    Dx'd approximately 06/2008.  No retinopathy as of 02/2017 ophth.  . Erectile dysfunction   . Heart murmur, systolic [id number]   mild intensity, asymptomatic; echo 01/2018 valves fine->obs  . History of colon polyps 01/2016   Recall 3 yrs  . History of pneumonia 2008   Hospitalized  . History of scarlet fever    age 80  . Hyperlipidemia, mixed 1993   myalgias on pravastatin  . Hypertension 2009   "marked chronic cardiomegaly" on CXR 01/2009 per old records.  . 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 Insomnia   . Keratoconus    dx'd 1973  . Memory changes   . OSA on CPAP 2004; 2019   Back on CPAP 04/2018  . Osteoarthritis of both knees    Mild plain film changes in medial compartment bilat  . Restless leg    gabapentin helpful  . Rhinitis, chronic   . 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 APPENDECTOMY  1972  . COLONOSCOPY  X 4   Most recent was 2007 [place], [place]), with removal of polyps in the first two.  02/21/16: tubular adenoma.  Recall 3 yrs (Dr. Ardis Hughs).  . 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 ELECTROCARDIOGRAM  01/29/2009   NORMAL  . LUMBAR LAMINECTOMY  01/2009  . TONSILLECTOMY AND ADENOIDECTOMY     age 26  . TRANSTHORACIC ECHOCARDIOGRAM  02/22/2018   EF 55-60%, grd I DD  . URETHRAL DILATION     2 as a child, one as an adult    . VASECTOMY  1994  . VITRECTOMY      MEDICATIONS: Current Outpatient Medications on File Prior to Visit  Medication Sig Dispense Refill  . aspirin 325 MG tablet Take 325 mg by mouth daily.      [name] atorvastatin (LIPITOR) 80 MG tablet Take 1 tablet (80 mg total) by mouth daily. 90 tablet 0  . buPROPion (WELLBUTRIN SR) 150 MG 12 hr tablet Take 1 tablet (150 mg total) by mouth daily. (Patient taking differently: Take 300 mg by mouth daily. ) 60 tablet 0  . Choline Fenofibrate (FENOFIBRIC ACID) 135 MG CPDR Take 1 capsule by mouth daily. 90 capsule 0  . escitalopram (LEXAPRO) 20 MG tablet TAKE 1 TABLET(20 MG) BY MOUTH DAILY 30 tablet 1  . finasteride (PROSCAR) 5 MG tablet Take 1 tablet (5 mg total)  by mouth daily. 90 tablet 0  . fluticasone (FLONASE) 50 MCG/ACT nasal spray Place 2 sprays into both nostrils daily. 48 g 1  . gabapentin (NEURONTIN) 300 MG capsule Take 300 mg by mouth 3 (three) times daily.    [name] glucose blood test strip Use as instructed 100 each 0  . Insulin Detemir (LEVEMIR FLEXTOUCH) 100 UNIT/ML Pen 56 U SQ once every day 15 mL 11  . Insulin Pen Needle (BD PEN NEEDLE NANO U/F) 32G X 4 MM MISC USE FOR INSULIN INJECTION FOUR TIMES A DAY 100 each 0  . liraglutide (VICTOZA) 18 MG/3ML SOPN Inject 0.3 mLs (1.8 mg total) into the skin every morning. 3 pen 0  . MELATONIN PO Take by mouth.    . metFORMIN (GLUCOPHAGE) 1000 MG tablet TAKE 1 TABLET BY MOUTH TWICE DAILY WITH A MEAL 180 tablet 0  . Naproxen Sodium (ALEVE) 220 MG CAPS Take 2 capsules by mouth at bedtime as needed.      . Omega-3 Fatty Acids (FISH OIL) 1000 MG CAPS Take 1 capsule by mouth 2 (two) times daily.    . ONE TOUCH ULTRA TEST test strip TEST THREE TIMES DAILY 100 each 11  . SALINE NASAL SPRAY NA Place into the nose daily as needed.    . tamsulosin (FLOMAX) 0.4 MG CAPS capsule Take 2 capsules (0.8 mg total) by mouth at bedtime. 180 capsule 0  . vitamin B-12 (CYANOCOBALAMIN) 1000 MCG tablet Take 2,000 mcg by mouth daily.      . Vitamin D, Ergocalciferol, (DRISDOL) 1.25 MG (50000 UNIT) CAPS capsule Take 1 capsule (50,000 Units total) by mouth every 7 (seven) days. 4 capsule 0   No current facility-administered medications on file prior to visit.    ALLERGIES: Allergies  Allergen Reactions  . Simvastatin Other (See Comments)    myalgias  . Antihistamines, Diphenhydramine-Type Other (See Comments)    depression  . Codeine Nausea Only  . Penicillins Hives  . Sulfa Antibiotics Hives    FAMILY HISTORY: Family History  Problem Relation Age of Onset  . Heart disease Mother   . Hyperlipidemia Mother   . Stroke Mother   . Diabetes Mother   . Depression Mother   . Alzheimer's disease Mother   . Stroke Father   . Hyperlipidemia Father   . Heart disease Father   . Diabetes Father   . Hypertension Father   . Obesity Father   . Alcohol abuse Sister   . Depression Sister     SOCIAL HISTORY: Social History   Socioeconomic History  . Marital status: Married    Spouse name: Jusitn Fei  . Number of children: Not on file  . Years of education: Not on file  . Highest education level: Not on file  Occupational History  . Occupation: Retired  Tobacco Use  . Smoking status: Former Research scientist (life sciences)  . Smokeless tobacco: Never Used  Substance and Sexual Activity  . Alcohol use: Yes    Comment: social, 3-4 drinks month  . Drug use: No  . Sexual activity: Yes    Partners: Female  Other Topics Concern  . Not on file  Social History Narrative   Divorced, then remarried.  Has 3 sons, no grandchildren.   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   No tobacco.  Rare ETOH.  No drug abuse.   Enjoys reading and spending time with his 2 dogs.   Right handed  Social Determinants of Health   Financial Resource Strain:   . Difficulty of Paying Living Expenses: Not on file  Food Insecurity:   . Worried About Charity fundraiser in the Last  Year: Not on file  . Ran Out of Food in the Last Year: Not on file  Transportation Needs:   . Lack of Transportation (Medical): Not on file  . Lack of Transportation (Non-Medical): Not on file  Physical Activity:   . Days of Exercise per Week: Not on file  . Minutes of Exercise per Session: Not on file  Stress:   . Feeling of Stress : Not on file  Social Connections:   . Frequency of Communication with Friends and Family: Not on file  . Frequency of Social Gatherings with Friends and Family: Not on file  . Attends Religious Services: Not on file  . Active Member of Clubs or Organizations: Not on file  . Attends Archivist Meetings: Not on file  . Marital Status: Not on file  Intimate Partner Violence:   . Fear of Current or Ex-Partner: Not on file  . Emotionally Abused: Not on file  . Physically Abused: Not on file  . Sexually Abused: Not on file    REVIEW OF SYSTEMS: Constitutional: No fevers, chills, or sweats, no generalized fatigue, change in appetite Eyes: No visual changes, double vision, eye pain Ear, nose and throat: No hearing loss, ear pain, nasal congestion, sore throat Cardiovascular: No chest pain, palpitations Respiratory:  No shortness of breath at rest or with exertion, wheezes GastrointestinaI: No nausea, vomiting, diarrhea, abdominal pain, fecal incontinence Genitourinary:  No dysuria, urinary retention or frequency Musculoskeletal:  No neck pain, +back pain Integumentary: No rash, pruritus, skin lesions Neurological: as above Psychiatric: No depression, insomnia, anxiety Endocrine: No palpitations, fatigue, diaphoresis, mood swings, change in appetite, change in weight, increased thirst Hematologic/Lymphatic:  No anemia, purpura, petechiae. Allergic/Immunologic: no itchy/runny eyes, nasal congestion, recent allergic reactions, rashes  PHYSICAL EXAM: Vitals:   02/05/20 1013  BP: 133/76  Pulse: 77  SpO2: 98%   General: No acute distress Head:   Normocephalic/atraumatic Skin/Extremities: No rash, no edema Neurological Exam: Mental status: alert and oriented to person, place, and time, no dysarthria or aphasia, Fund of knowledge is appropriate.  Recent and remote memory are intact.  Attention and concentration are normal.    Able to name objects and repeat phrases.  [employer] Mental Exam 02/05/2020  Weekday Correct 1  Current year 1  What state are we in? 1  Amount spent 1  Amount left 2  # of Animals 3  5 objects recall 5  Number series 1  Hour markers 2  Time correct 2  Placed X in triangle correctly 1  Largest Figure 1  Name of male 2  Date back to work 0  Type of work 2  State she lived in 2  Total score 27    Cranial nerves: CN I: not tested CN II: pupils equal, round and reactive to light, visual fields intact CN III, IV, VI:  full range of motion, no nystagmus, no ptosis CN V: facial sensation intact CN VII: upper and lower face symmetric CN VIII: hearing intact to conversation Bulk & Tone: normal, no fasciculations, no cogwheeling Motor: 5/5 throughout with no pronator drift. Sensation: intact to light touch, cold, pin. Decreased vibration sense to knee on left.  No extinction to double simultaneous stimulation.  Romberg test negative Deep Tendon Reflexes: +2 throughout except for  absent ankle jerks bilaterally, no ankle clonus Plantar responses: downgoing bilaterally Cerebellar: no incoordination on finger to nose testing Gait: narrow-based and steady, difficulty with tandem walking Tremor: no resting tremor. 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 His  wife is reporting more driving concerns. We discussed different causes of memory loss, check TSH and B12. MRI brain without contrast will be ordered to assess for underlying structural abnormality and assess vascular load. He will be scheduled for Neurocognitive testing to further evaluate memory concerns. We discussed the importance of control of vascular risk factors, physical exercise, and brain stimulation exercises for brain health. Continue to monitor driving. Follow-up in 6 months, they know to call for any changes.   Thank you for allowing me to participate in the care of this patient. Please do not hesitate to call for any questions or concerns.   Ellouise Newer, M.D.  CC: Dr. Anitra Lauth

## 2020-02-05 NOTE — Progress Notes (Signed)
3

## 2020-02-05 NOTE — Patient Instructions (Signed)
Great meeting both of you!  1. Let's do bloodwork for TSH, B12  2. Schedule MRI brain without contrast  3. Schedule Neurocognitive testing  4. Follow-up in 6 months, call for any changes  FALL PRECAUTIONS: Be cautious when walking. Scan the area for obstacles that may increase the risk of trips and falls. When getting up in the mornings, sit up at the edge of the bed for a few minutes before getting out of bed. Consider elevating the bed at the head end to avoid drop of blood pressure when getting up. Walk always in a well-lit room (use night lights in the walls). Avoid area rugs or power cords from appliances in the middle of the walkways. Use a walker or a cane if necessary and consider physical therapy for balance exercise. Get your eyesight checked regularly.  FINANCIAL OVERSIGHT: Supervision, especially oversight when making financial decisions or transactions is also recommended.  HOME SAFETY: Consider the safety of the kitchen when operating appliances like stoves, microwave oven, and blender. Consider having supervision and share cooking responsibilities until no longer able to participate in those. Accidents with firearms and other hazards in the house should be identified and addressed as well.  DRIVING: Regarding driving, in patients with progressive memory problems, driving will be impaired. We advise to have someone else do the driving if trouble finding directions or if minor accidents are reported. Independent driving assessment is available to determine safety of driving.  ABILITY TO BE LEFT ALONE: If patient is unable to contact 911 operator, consider using LifeLine, or when the need is there, arrange for someone to stay with patients. Smoking is a fire hazard, consider supervision or cessation. Risk of wandering should be assessed by caregiver and if detected at any point, supervision and safe proof recommendations should be instituted.  MEDICATION SUPERVISION: Inability to  self-administer medication needs to be constantly addressed. Implement a mechanism to ensure safe administration of the medications.  RECOMMENDATIONS FOR ALL PATIENTS WITH MEMORY PROBLEMS: 1. Continue to exercise (Recommend 30 minutes of walking everyday, or 3 hours every week) 2. Increase social interactions - continue going to [place] and enjoy social gatherings with friends and family 3. Eat healthy, avoid fried foods and eat more fruits and vegetables 4. Maintain adequate blood pressure, blood sugar, and blood cholesterol level. Reducing the risk of stroke and cardiovascular disease also helps promoting better memory. 5. Avoid stressful situations. Live a simple life and avoid aggravations. Organize your time and prepare for the next day in anticipation. 6. Sleep well, avoid any interruptions of sleep and avoid any distractions in the bedroom that may interfere with adequate sleep quality 7. Avoid sugar, avoid sweets as there is a strong link between excessive sugar intake, diabetes, and cognitive impairment We discussed the Mediterranean diet, which has been shown to help patients reduce the risk of progressive memory disorders and reduces cardiovascular risk. This includes eating fish, eat fruits and green leafy vegetables, nuts like almonds and hazelnuts, walnuts, and also use olive oil. Avoid fast foods and fried foods as much as possible. Avoid sweets and sugar as sugar use has been linked to worsening of memory function.  There is always a concern of gradual progression of memory problems. If this is the case, then we may need to adjust level of care according to patient needs. Support, both to the patient and caregiver, should then be put into place.

## 2020-02-07 ENCOUNTER — Other Ambulatory Visit: Payer: Self-pay

## 2020-02-07 ENCOUNTER — Encounter: Payer: Self-pay | Admitting: Adult Health

## 2020-02-07 ENCOUNTER — Telehealth: Payer: Self-pay

## 2020-02-07 ENCOUNTER — Ambulatory Visit (INDEPENDENT_AMBULATORY_CARE_PROVIDER_SITE_OTHER): Payer: Medicare Other | Admitting: Physician Assistant

## 2020-02-07 ENCOUNTER — Encounter (INDEPENDENT_AMBULATORY_CARE_PROVIDER_SITE_OTHER): Payer: Self-pay | Admitting: Physician Assistant

## 2020-02-07 VITALS — BP 106/62 | HR 91 | Temp 98.3°F | Ht 70.0 in | Wt 227.0 lb

## 2020-02-07 DIAGNOSIS — Z794 Long term (current) use of insulin: Secondary | ICD-10-CM

## 2020-02-07 DIAGNOSIS — E559 Vitamin D deficiency, unspecified: Secondary | ICD-10-CM | POA: Diagnosis not present

## 2020-02-07 DIAGNOSIS — Z6832 Body mass index (BMI) 32.0-32.9, adult: Secondary | ICD-10-CM | POA: Diagnosis not present

## 2020-02-07 DIAGNOSIS — M5416 Radiculopathy, lumbar region: Secondary | ICD-10-CM | POA: Diagnosis not present

## 2020-02-07 DIAGNOSIS — E669 Obesity, unspecified: Secondary | ICD-10-CM | POA: Diagnosis not present

## 2020-02-07 DIAGNOSIS — F3289 Other specified depressive episodes: Secondary | ICD-10-CM | POA: Diagnosis not present

## 2020-02-07 DIAGNOSIS — M545 Low back pain: Secondary | ICD-10-CM | POA: Diagnosis not present

## 2020-02-07 DIAGNOSIS — M6281 Muscle weakness (generalized): Secondary | ICD-10-CM | POA: Diagnosis not present

## 2020-02-07 DIAGNOSIS — E119 Type 2 diabetes mellitus without complications: Secondary | ICD-10-CM

## 2020-02-07 MED ORDER — VITAMIN D (ERGOCALCIFEROL) 1.25 MG (50000 UNIT) PO CAPS: 50000.0000 [IU] | ORAL_CAPSULE | ORAL | 0 refills | Status: DC

## 2020-02-07 MED ORDER — ESCITALOPRAM OXALATE 20 MG PO TABS: 20.0000 mg | ORAL_TABLET | Freq: Every day | ORAL | 0 refills | Status: DC

## 2020-02-07 MED ORDER — OZEMPIC (0.25 OR 0.5 MG/DOSE) 2 MG/1.5ML ~~LOC~~ SOPN: 0.2500 mg | PEN_INJECTOR | SUBCUTANEOUS | 0 refills | Status: DC

## 2020-02-07 NOTE — Telephone Encounter (Signed)
Pt called and voicemail left per DPR informing them that thyroid and B12 level look good

## 2020-02-07 NOTE — Progress Notes (Signed)
Chief Complaint:   OBESITY [name] [name] is here to discuss his progress with his obesity treatment plan along with follow-up of his obesity related diagnoses. [name] [name] is on the Category 4 Plan and states he is following his eating plan approximately 85% of the time. [name] [name] states he is walking/PT 60 minutes 2-4 times per week.  Today's visit was #: 48 Starting weight: 272 lbs Starting date: 02/14/2018 Today's weight: 227 lbs Today's date: 02/07/2020 Total lbs lost to date: 45 Total lbs lost since last in-office visit: 6  Interim History: Aravind reports that he has been eating more protein, especially at dinner. He still notes hunger throughout the day.  Subjective:   Type 2 diabetes mellitus without complication, with long-term current use of insulin ([name] [name]). [name] [name] states that some days he is hungry and some days hunger is controlled.  Lab Results  Component Value Date   HGBA1C 5.8 (H) 12/12/2019   HGBA1C 6.1 (H) 08/22/2019   HGBA1C 6.4 05/19/2019   Lab Results  Component Value Date   MICROALBUR <0.7 07/04/2015   LDLCALC 68 12/12/2019   CREATININE 0.95 12/12/2019   No results found for: INSULIN  Vitamin D deficiency.  Antwian is on Vitamin D. No nausea, vomiting, or muscle weakness. Last Vitamin D 35.0 on 12/12/2019.   Other depression, with emotional eating. [name] [name] is struggling with emotional eating and using food for comfort to the extent that it is negatively impacting his health. He has been working on behavior modification techniques to help reduce his emotional eating and has been somewhat successful. He shows no sign of suicidal or homicidal ideations. [name] [name] continues to have cravings.  Assessment/Plan:   Type 2 diabetes mellitus without complication, with long-term current use of insulin ([name] [name]).  Good blood sugar control is important to decrease the likelihood of diabetic complications such as nephropathy, neuropathy, limb loss, blindness, coronary artery  disease, and death. Intensive lifestyle modification including diet, exercise and weight loss are the first line of treatment for diabetes. [name] [name] will start Semaglutide,0.25 or 0.5MG /DOS, (OZEMPIC, 0.25 OR 0.5 MG/DOSE,) 2 MG/1.5ML SOPN 0.25 mg SQ every week #1 with 0 refills. He will discontinue Victoza.  Vitamin D deficiency. Low Vitamin D level contributes to fatigue and are associated with obesity, breast, and colon cancer. He was given a refill on his Vitamin D, Ergocalciferol, (DRISDOL) 1.25 MG (50000 UNIT) CAPS capsule every week #4 with 0 refills and will follow-up for routine testing of Vitamin D, at least 2-3 times per year to avoid over-replacement.    Other depression, with emotional eating. Behavior modification techniques were discussed today to help [name] [name] deal with his emotional/non-hunger eating behaviors.  Orders and follow up as documented in patient record. [name] [name] was given a refill on his escitalopram (LEXAPRO) 20 MG tablet #90 with 0 refills.  Class 1 obesity with serious comorbidity and body mass index (BMI) of 32.0 to 32.9 in adult, unspecified obesity type.   [name] [name] is currently in the action stage of change. As such, his goal is to continue with weight loss efforts. He has agreed to the Category 4 Plan.   Exercise goals: Older adults should follow the adult guidelines. When older adults cannot meet the adult guidelines, they should be as physically active as their abilities and conditions will allow.   Behavioral modification strategies: increasing lean protein intake and meal planning and cooking strategies.  [name] [name] has agreed to follow-up with our clinic in 3 weeks. He was informed of the importance of frequent  follow-up visits to maximize his success with intensive lifestyle modifications for his multiple health conditions.   Objective:   Blood pressure 106/62, pulse 91, temperature 98.3 F (36.8 C), temperature source Oral, height 5\' 10"  (1.778 m), weight 227 lb (103  kg), SpO2 97 %. Body mass index is 32.57 kg/m.  General: Cooperative, alert, well developed, in no acute distress. HEENT: Conjunctivae and lids unremarkable. Cardiovascular: Regular rhythm.  Lungs: Normal work of breathing. Neurologic: No focal deficits.   Lab Results  Component Value Date   CREATININE 0.95 12/12/2019   BUN 22 12/12/2019   NA 138 12/12/2019   K 4.0 12/12/2019   CL 101 12/12/2019   CO2 24 12/12/2019   Lab Results  Component Value Date   ALT 18 12/12/2019   AST 14 12/12/2019   ALKPHOS 34 (L) 12/12/2019   BILITOT 0.4 12/12/2019   Lab Results  Component Value Date   HGBA1C 5.8 (H) 12/12/2019   HGBA1C 6.1 (H) 08/22/2019   HGBA1C 6.4 05/19/2019   HGBA1C 6.1 (H) 02/14/2019   HGBA1C 5.8 (A) 11/14/2018   No results found for: INSULIN Lab Results  Component Value Date   TSH 1.25 02/05/2020   Lab Results  Component Value Date   CHOL 130 12/12/2019   HDL 48 12/12/2019   LDLCALC 68 12/12/2019   LDLDIRECT 112.0 07/31/2016   TRIG 69 12/12/2019   CHOLHDL 3 08/10/2018   Lab Results  Component Value Date   WBC 7.2 02/14/2018   HGB 13.4 02/14/2018   HCT 39.3 02/14/2018   MCV 85 02/14/2018   PLT 211.0 05/21/2014   No results found for: IRON, TIBC, FERRITIN  Obesity Behavioral Intervention Documentation for Insurance:   Approximately 15 minutes were spent on the discussion below.  ASK: We discussed the diagnosis of obesity with [name] [name] today and [name] [name] agreed to give [name] permission to discuss obesity behavioral modification therapy today.  ASSESS: [name] [name] has the diagnosis of obesity and his BMI today is 32.6. [name] [name] is in the action stage of change.   ADVISE: [name] [name] was educated on the multiple health risks of obesity as well as the benefit of weight loss to improve his health. He was advised of the need for long term treatment and the importance of lifestyle modifications to improve his current health and to decrease his risk of future health  problems.  AGREE: Multiple dietary modification options and treatment options were discussed and [name] [name] agreed to follow the recommendations documented in the above note.  ARRANGE: [name] [name] was educated on the importance of frequent visits to treat obesity as outlined per CMS and USPSTF guidelines and agreed to schedule his next follow up appointment today.  Attestation Statements:   Reviewed by clinician on day of visit: allergies, medications, problem list, medical history, surgical history, family history, social history, and previous encounter notes.  IMichaelene Song, am acting as transcriptionist for Abby Potash, PA-C   I have reviewed the above documentation for accuracy and completeness, and I agree with the above. Abby Potash, PA-C

## 2020-02-08 DIAGNOSIS — Z23 Encounter for immunization: Secondary | ICD-10-CM | POA: Diagnosis not present

## 2020-02-12 ENCOUNTER — Other Ambulatory Visit: Payer: Self-pay

## 2020-02-12 ENCOUNTER — Ambulatory Visit (INDEPENDENT_AMBULATORY_CARE_PROVIDER_SITE_OTHER): Payer: Medicare Other | Admitting: Adult Health

## 2020-02-12 VITALS — BP 104/52 | HR 83 | Temp 97.4°F | Ht 70.0 in | Wt 239.5 lb

## 2020-02-12 DIAGNOSIS — G4733 Obstructive sleep apnea (adult) (pediatric): Secondary | ICD-10-CM

## 2020-02-12 DIAGNOSIS — M5416 Radiculopathy, lumbar region: Secondary | ICD-10-CM | POA: Diagnosis not present

## 2020-02-12 DIAGNOSIS — M545 Low back pain: Secondary | ICD-10-CM | POA: Diagnosis not present

## 2020-02-12 DIAGNOSIS — Z9989 Dependence on other enabling machines and devices: Secondary | ICD-10-CM | POA: Diagnosis not present

## 2020-02-12 DIAGNOSIS — M6281 Muscle weakness (generalized): Secondary | ICD-10-CM | POA: Diagnosis not present

## 2020-02-12 NOTE — Progress Notes (Signed)
PATIENT: [name] [name] DOB: Mar 29, 1951  REASON FOR VISIT: follow up HISTORY FRO   M: patient  HISTORY OF PRESENT ILLNESS: Today 02/12/20:  [name] [name] is a 69 year old male with a history of obstructive sleep apnea on CPAP.  His download indicates that he uses machine nightly for compliance of 100%.  He uses machine greater than 4 hours 26 out of 30 days for compliance of 87%.  His residual AHI is 1.3 on 16 cm of water with EPR 2.  Leak in the 95th percentile is 22.4 L/min.  He reports the machine is working well for him.  He does state that there are some nights he gets up to go the bathroom and when he puts the machine back on it feels as if there is no air blowing.  He has not discussed this with his DME company.   HISTORY 02/09/2019: I reviewed his CPAP compliance data from 01/07/2019 through 02/05/2019 which is a total of 30 days, during which time he used his CPAP every night with percent used days greater than 4 hours at 97%, indicating excellent compliance with an average usage of 5 hours and 31 minutes, residual AHI at goal at 0.7 per hour, leaked very low with the 95th percentile at 0 L/m on a pressure of 16 cm with EPR of 2. He reportsdoing well, pressure at 16 is working well for him. We reduced it from 17 last time. He has turned off the ramp time which is better for him. Sometimes he sleeps an hour so without the mask on when he feels like he needs a break. Generally, he is able to sleep very well for the first 3 hours and then he has sleep disruption, has to go to the bathroom more than once for the rest of the night. He likes the cooler temperature and not too much moisture. He has adjusted the settings to his liking. He uses a fullface mask and tries to sleep on his sides. He is working on weight loss, goes to the weight management clinic, wife has been able to lose quite a bit of weight as well. She also has sleep apnea. Because of her sleep apnea mask leaking, he gets  disturbed sometimes, they typically sleep in separate bedrooms.    REVIEW OF SYSTEMS: Out of a complete 14 system review of symptoms, the patient complains only of the following symptoms, and all other reviewed systems are negative.  ESS 8  ALLERGIES: Allergies  Allergen Reactions  . Simvastatin Other (See Comments)    myalgias  . Antihistamines, Diphenhydramine-Type Other (See Comments)    depression  . Codeine Nausea Only  . Penicillins Hives  . Sulfa Antibiotics Hives    HOME MEDICATIONS: Outpatient Medications Prior to Visit  Medication Sig Dispense Refill  . aspirin 325 MG tablet Take 325 mg by mouth daily.      [name] atorvastatin (LIPITOR) 80 MG tablet Take 1 tablet (80 mg total) by mouth daily. 90 tablet 0  . buPROPion (WELLBUTRIN SR) 150 MG 12 hr tablet Take 1 tablet (150 mg total) by mouth daily. (Patient taking differently: Take 300 mg by mouth 2 (two) times daily. ) 60 tablet 0  . Choline Fenofibrate (FENOFIBRIC ACID) 135 MG CPDR Take 1 capsule by mouth daily. 90 capsule 0  . escitalopram (LEXAPRO) 20 MG tablet Take 1 tablet (20 mg total) by mouth daily. 90 tablet 0  . finasteride (PROSCAR) 5 MG tablet Take 1 tablet (5 mg total) by mouth  daily. 90 tablet 0  . fluticasone (FLONASE) 50 MCG/ACT nasal spray Place 2 sprays into both nostrils daily. 48 g 1  . gabapentin (NEURONTIN) 300 MG capsule Take 300 mg by mouth 3 (three) times daily.    . Insulin Detemir (LEVEMIR FLEXTOUCH) 100 UNIT/ML Pen 56 U SQ once every day 15 mL 11  . Insulin Pen Needle (BD PEN NEEDLE NANO U/F) 32G X 4 MM MISC USE FOR INSULIN INJECTION FOUR TIMES A DAY 100 each 0  . MELATONIN PO Take by mouth.    . metFORMIN (GLUCOPHAGE) 1000 MG tablet TAKE 1 TABLET BY MOUTH TWICE DAILY WITH A MEAL 180 tablet 0  . Naproxen Sodium (ALEVE) 220 MG CAPS Take 2 capsules by mouth at bedtime as needed.      . Omega-3 Fatty Acids (FISH OIL) 1000 MG CAPS Take 1 capsule by mouth 2 (two) times daily.    . ONE TOUCH ULTRA TEST  test strip TEST THREE TIMES DAILY 100 each 11  . SALINE NASAL SPRAY NA Place into the nose daily as needed.    . Semaglutide,0.25 or 0.5MG /DOS, (OZEMPIC, 0.25 OR 0.5 MG/DOSE,) 2 MG/1.5ML SOPN Inject 0.25 mg into the skin once a week. 1 pen 0  . tamsulosin (FLOMAX) 0.4 MG CAPS capsule Take 2 capsules (0.8 mg total) by mouth at bedtime. 180 capsule 0  . vitamin B-12 (CYANOCOBALAMIN) 1000 MCG tablet Take 2,000 mcg by mouth daily.     . Vitamin D, Ergocalciferol, (DRISDOL) 1.25 MG (50000 UNIT) CAPS capsule Take 1 capsule (50,000 Units total) by mouth every 7 (seven) days. 4 capsule 0   No facility-administered medications prior to visit.    PAST MEDICAL HISTORY: Past Medical History:  Diagnosis Date  . Allergic rhinitis, unspecified    Allergy testing via allergist 04/2017--mostly neg.  Dr. Donneta Romberg recommended referral to ENT so i did this.  . Back pain    LBP->DDD/spondylosis w/ intermittent radiulopathy-type leg pains, + bilat hip and thigh pain-->Dr. Vertell Limber to do MRI as of 05/2019  . BPH (benign prostatic hypertrophy)   . Chronic nasal congestion   . DDD (degenerative disc disease), lumbar 2010   laminectomy 01/2009.  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  . Depression    Hospitalized for suicidal ideation 1992.  Has been on lexapro since 10/2008.  . Diabetes mellitus type II    Dx'd approximately 06/2008.  No retinopathy as of 02/2017 ophth.  . Erectile dysfunction   . Heart murmur, systolic 123XX123   mild intensity, asymptomatic; echo 01/2018 valves fine->obs  . History of colon polyps 01/2016   Recall 3 yrs  . History of pneumonia 2008   Hospitalized  . History of scarlet fever    age 21  . Hyperlipidemia, mixed 1993   myalgias on pravastatin  . Hypertension 2009   "marked chronic cardiomegaly" on CXR 01/2009 per old records.  . 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 Insomnia   . Keratoconus    dx'd 1973  . Memory  changes   . OSA on CPAP 2004; 2019   Back on CPAP 04/2018  . Osteoarthritis of both knees    Mild plain film changes in medial compartment bilat  . Restless leg    gabapentin helpful  . Rhinitis, chronic   . 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 APPENDECTOMY  1972  . COLONOSCOPY  X 4   Most recent was 2007 [place], [place]), with removal of polyps in the first two.  02/21/16: tubular adenoma.  Recall 3 yrs (Dr. Ardis Hughs).  . 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 ELECTROCARDIOGRAM  01/29/2009   NORMAL  . LUMBAR LAMINECTOMY  01/2009  . TONSILLECTOMY AND ADENOIDECTOMY     age 22  . TRANSTHORACIC ECHOCARDIOGRAM  02/22/2018   EF 55-60%, grd I DD  . URETHRAL DILATION     2 as a child, one as an adult  . VASECTOMY  1994  . VITRECTOMY      FAMILY HISTORY: Family History  Problem Relation Age of Onset  . Heart disease Mother   . Hyperlipidemia Mother   . Stroke Mother   . Diabetes Mother   . Depression Mother   . Alzheimer's disease Mother   . Stroke Father   . Hyperlipidemia Father   . Heart disease Father   . Diabetes Father   . Hypertension Father   . Obesity Father   . Alcohol abuse Sister   . Depression Sister     SOCIAL HISTORY: Social History   Socioeconomic History  . Marital status: Married    Spouse name: Marris Loran  . Number of children: Not on file  . Years of education: Not on file  . Highest education level: Not on file  Occupational History  . Occupation: Retired  Tobacco Use  . Smoking status: Former Research scientist (life sciences)  . Smokeless tobacco: Never Used  Substance and Sexual Activity  . Alcohol use: Yes    Comment: social, 3-4 drinks month  . Drug use: No  . Sexual activity: Yes    Partners: Female  Other Topics Concern  . Not on file  Social History Narrative   Divorced, then remarried.  Has 3 sons, no grandchildren.   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   No tobacco.  Rare ETOH.  No drug abuse.   Enjoys reading and spending time with his 2 dogs.   Right handed    Social Determinants of Health   Financial Resource Strain:   . Difficulty of Paying Living Expenses:   Food Insecurity:   . Worried About Charity fundraiser in the Last Year:   . Arboriculturist in the Last Year:   Transportation Needs:   . Film/video editor (Medical):   [name] Lack of Transportation (Non-Medical):   Physical Activity:   . Days of Exercise per Week:   . Minutes of Exercise per Session:   Stress:   . Feeling of Stress :   Social Connections:   . Frequency of Communication with Friends and Family:   . Frequency of Social Gatherings with Friends and Family:   . Attends Religious Services:   . Active Member of Clubs or Organizations:   . Attends Archivist Meetings:   [name] Marital Status:   Intimate Partner Violence:   . Fear of Current or Ex-Partner:   . Emotionally Abused:   [name] Physically Abused:   . Sexually Abused:       PHYSICAL EXAM  Vitals:   02/12/20 1404  BP: (!) 104/52  Pulse: 83  Temp: (!) 97.4 F (36.3 C)  SpO2: 94%  Weight: 239 lb 8 oz (108.6 kg)  Height: 5\' 10"  (1.778 m)   Body mass  index is 34.36 kg/m.  Generalized: Well developed, in no acute distress  Chest: Lungs clear to auscultation bilaterally  Neurological examination  Mentation: Alert oriented to time, place, history taking. Follows all commands speech and language fluent Cranial nerve II-XII: Extraocular movements were full, visual field were full on confrontational test Head turning and shoulder shrug  were normal and symmetric. Motor: The motor testing reveals 5 over 5 strength of all 4 extremities. Good symmetric motor tone is noted throughout.  Sensory: Sensory testing is intact to soft touch on all 4 extremities. No evidence of extinction is noted.   Gait and station: Gait is normal.    DIAGNOSTIC DATA (LABS, IMAGING, TESTING) - I reviewed patient records, labs, notes, testing and imaging myself where available.  Lab Results  Component Value Date   WBC 7.2 02/14/2018   HGB 13.4 02/14/2018   HCT 39.3 02/14/2018   MCV 85 02/14/2018   PLT 211.0 05/21/2014      Component Value Date/Time   NA 138 12/12/2019 1303   K 4.0 12/12/2019 1303   CL 101 12/12/2019 1303   CO2 24 12/12/2019 1303   GLUCOSE 75 12/12/2019 1303   GLUCOSE 113 (H) 08/10/2018 1033   BUN 22 12/12/2019 1303   CREATININE 0.95 12/12/2019 1303   CREATININE 0.94 01/18/2017 0832   CALCIUM 9.5 12/12/2019 1303   PROT 6.4 12/12/2019 1303   ALBUMIN 4.5 12/12/2019 1303   AST 14 12/12/2019 1303   ALT 18 12/12/2019 1303   ALKPHOS 34 (L) 12/12/2019 1303   BILITOT 0.4 12/12/2019 1303   GFRNONAA 83 12/12/2019 1303   GFRAA 96 12/12/2019 1303   Lab Results  Component Value Date   CHOL 130 12/12/2019   HDL 48 12/12/2019   LDLCALC 68 12/12/2019   LDLDIRECT 112.0 07/31/2016   TRIG 69 12/12/2019   CHOLHDL 3 08/10/2018   Lab Results  Component Value Date   HGBA1C 5.8 (H) 12/12/2019   Lab Results  Component Value Date   VITAMINB12 1,159 (H) 02/05/2020   Lab Results  Component Value Date   TSH 1.25 02/05/2020      ASSESSMENT AND PLAN 69 y.o. year old male  has a past medical history of Allergic rhinitis, unspecified, Back pain, BPH (benign prostatic hypertrophy), Chronic nasal congestion, DDD (degenerative disc disease), lumbar (2010), Depression, Diabetes mellitus type II, Erectile dysfunction, Heart murmur, systolic (123XX123), History of colon polyps (01/2016), History of pneumonia (2008), History of scarlet fever, Hyperlipidemia, mixed (1993), Hypertension (2009), Hypogonadism, male (01/11/2012), Insomnia, Keratoconus, Memory changes, OSA on CPAP (2004; 2019), Osteoarthritis of both knees, Restless leg, Rhinitis, chronic, and Urethral stricture. here  with:  1. OSA on CPAP  - CPAP compliance excellent - Good treatment of AHI  - Encourage patient to use CPAP nightly and > 4 hours each night -Encouraged patient to reach out to his DME company if he continues to have trouble with his machine - F/U in 1 year or sooner if needed   I spent 15 minutes of face-to-face and non-face-to-face time with patient.  This included previsit chart review, lab review, study review, order entry, electronic health record documentation, patient education.  Ward Givens, MSN, NP-C 02/12/2020, 1:55 PM [facility] [address] [place], [place] [address] [phone]

## 2020-02-12 NOTE — Patient Instructions (Signed)
Your Plan:  Continue using CPAP nightly  Please call [employer] at [phone], and press option 1 when prompted. Their customer service representatives will be glad to assist you. If they are unable to answer, please leave a message and they will call you back. Make sure to leave your name and return phone number.  Thank you for coming to see [name] at [facility]. I hope we have been able to provide you high quality care today.  You may receive a patient satisfaction survey over the next few weeks. We would appreciate your feedback and comments so that we may continue to improve ourselves and the health of our patients.

## 2020-02-13 ENCOUNTER — Encounter: Payer: Self-pay | Admitting: Family Medicine

## 2020-02-14 ENCOUNTER — Ambulatory Visit: Payer: Medicare Other

## 2020-02-14 ENCOUNTER — Other Ambulatory Visit: Payer: Self-pay

## 2020-02-14 ENCOUNTER — Encounter: Payer: Self-pay | Admitting: Counselor

## 2020-02-14 ENCOUNTER — Ambulatory Visit (INDEPENDENT_AMBULATORY_CARE_PROVIDER_SITE_OTHER): Payer: Medicare Other | Admitting: Counselor

## 2020-02-14 DIAGNOSIS — G3184 Mild cognitive impairment, so stated: Secondary | ICD-10-CM | POA: Diagnosis not present

## 2020-02-14 DIAGNOSIS — Z8659 Personal history of other mental and behavioral disorders: Secondary | ICD-10-CM | POA: Diagnosis not present

## 2020-02-14 DIAGNOSIS — M6281 Muscle weakness (generalized): Secondary | ICD-10-CM | POA: Diagnosis not present

## 2020-02-14 DIAGNOSIS — M5416 Radiculopathy, lumbar region: Secondary | ICD-10-CM | POA: Diagnosis not present

## 2020-02-14 DIAGNOSIS — G4733 Obstructive sleep apnea (adult) (pediatric): Secondary | ICD-10-CM

## 2020-02-14 DIAGNOSIS — Z9989 Dependence on other enabling machines and devices: Secondary | ICD-10-CM

## 2020-02-14 DIAGNOSIS — M545 Low back pain: Secondary | ICD-10-CM | POA: Diagnosis not present

## 2020-02-14 NOTE — Progress Notes (Signed)
   Psychometrist Note   Cognitive testing was administered to [name] [name] by Lamar Benes, B.S. (Technician) (psychometrist) under the supervision of Alphonzo Severance, Psy.D., ABN. [name] was able to tolerate all test procedures. Dr. Nicole Kindred met with the patient as needed to manage any emotional reactions to the testing procedures (if applicable). Rest breaks were offered.    The battery of tests administered was selected by Dr. Nicole Kindred with consideration to the patient's current level of functioning, the nature of his symptoms, emotional and behavioral responses during the interview, level of literacy, observed level of motivation/effort, and the nature of the referral question. This battery was communicated to the psychometrist. Communication between Dr. Nicole Kindred and the psychometrist was ongoing throughout the evaluation and Dr. Nicole Kindred was immediately accessible at all times. Dr. Nicole Kindred provided supervision to the technician on the date of this service, to the extent necessary to assure the quality of all services provided.    [name] will return in approximately one week for an interactive feedback session with Dr. Nicole Kindred, at which time male test performance, clinical impressions, and treatment recommendations will be reviewed in detail. The patient understands he can contact our office should he require our assistance before this time.   A total of 105 minutes of billable time were spent with [name] [name] by the technician, including test administration and scoring time. Billing for these services is reflected in Dr. Les Pou note.   This note reflects time spent with the psychometrician and does not include test scores, clinical history, or any interpretations made by Dr. Nicole Kindred. The full report will follow in a separate note.

## 2020-02-14 NOTE — Progress Notes (Signed)
[facility]  Patient Name: [name] [name] MRN: [id number] Date of Birth: 08/31/1951 Age: 69 y.o. Education: 19 years  Referral Circumstances and Background Information  [name] [name] is a 68 y.o., right-hand dominant, married man with a history of problems with thinking and memory over the past 2 to 3 years as extensively detailed in Dr. Amparo Bristol note from just over a week ago. On interview, [name] [name] largely reiterated the history contained therein. He is aware of his problems, which have been occurring particularly over the past two years. He consulted with a neurologist "well over a year ago" who thought his issues were related to sleep apnea but he uses a CPAP and they haven't improved. He stated that he has a hard time remembering names (e.g., of his neighbors or of celebrities). He sometimes forgets things that his wife tells him, but he doesn't forget entire events, it is more details of things. He gets more disoriented and has gotten temporarily lost in familiar areas once or twice. He thinks he has problems with word finding more than in the past but isn't sure if it is more than other people his age. He does fairly well with orientation other than minor issues that sound normal (e.g., losing the day of the week or specific date occasionally). With respect to mood, the patient stated that he thinks he is in fairly good spirits, although his wife perceives him as more irritable. He says he has always had some impatience and isn't sure if it is worse than in the past. He stated that his energy is normal for him. He isn't sleeping well, however, he has problems going to sleep and he wakes up frequently to urinate although he is able to go back to sleep. He estimated that he is sleeping about 6 hours a night and he did not sleep well last night, only a few hours. He isn't acting out his dreams or thrashing around in his sleep but stated that he used  to do that (it sounds like he was going through a hard divorce at the time).   With respect to functioning, he is still driving but there are some minor changes, sometimes things won't look familiar to him or he will get turned around in an area he should know. He stated that he exercises additional caution because he doesn't feel quite as skilled when driving as in the past. It sounds like his wife is particularly worried about his driving, but as per his report there are no clear safety issues. He manages his own medications. His wife manages the finances and she always has because he "hates finances." He has essentially normal function at home and hobbies although he thinks the ability to "follow a writer's reasoning" is not what it once was. He will have to reread things. He does most of the cooking and generally does wellt, and he cooks from memory as opposed to using a recipe. He is still doing chores around the house although his wife has taken some things over related to back problems. He is independent with respect to community utilization and the like.   Past Medical History and Review of Relevant Studies   Patient Active Problem List   Diagnosis Date Noted  . Other fatigue 02/14/2018  . Shortness of breath on exertion 02/14/2018  . Type 2 diabetes mellitus with hyperglycemia, with long-term current use of insulin ([place]) 02/14/2018  . Abnormal EKG 02/14/2018  . Maxillary sinusitis, acute  12/17/2015  . Cough 12/17/2015  . Fever 12/17/2015  . Diabetes mellitus without complication ([place]) [id number]  . Tooth pain 06/21/2013  . Orthostatic lightheadedness 05/17/2013  . Health maintenance examination 05/17/2013  . Groin strain 11/10/2012  . Hypogonadism, male 01/11/2012  . Type II or unspecified type diabetes mellitus without mention of complication, not stated as uncontrolled 07/08/2011  . HTN (hypertension), benign 07/08/2011  . Hyperlipidemia 07/08/2011  . BPH (benign prostatic  hypertrophy) 07/08/2011  . Hx of adenomatous colonic polyps 07/08/2011    Review of Neuroimaging and Relevant Studies:  The patient has no neuroimaging; he has an MRI scheduled in April.   Note from Dr. Delice Lesch reviewed and appreciated; she demonstrated a normal elemental neurological exam and a SLUMS of 28, although he did have with some neuropathy.   Current Outpatient Medications  Medication Sig Dispense Refill  . aspirin 325 MG tablet Take 325 mg by mouth daily.      [name] atorvastatin (LIPITOR) 80 MG tablet Take 1 tablet (80 mg total) by mouth daily. 90 tablet 0  . buPROPion (WELLBUTRIN SR) 150 MG 12 hr tablet Take 1 tablet (150 mg total) by mouth daily. (Patient taking differently: Take 300 mg by mouth 2 (two) times daily. ) 60 tablet 0  . Choline Fenofibrate (FENOFIBRIC ACID) 135 MG CPDR Take 1 capsule by mouth daily. 90 capsule 0  . escitalopram (LEXAPRO) 20 MG tablet Take 1 tablet (20 mg total) by mouth daily. 90 tablet 0  . finasteride (PROSCAR) 5 MG tablet Take 1 tablet (5 mg total) by mouth daily. 90 tablet 0  . fluticasone (FLONASE) 50 MCG/ACT nasal spray Place 2 sprays into both nostrils daily. 48 g 1  . gabapentin (NEURONTIN) 300 MG capsule Take 300 mg by mouth 3 (three) times daily.    . Insulin Detemir (LEVEMIR FLEXTOUCH) 100 UNIT/ML Pen 56 U SQ once every day 15 mL 11  . Insulin Pen Needle (BD PEN NEEDLE NANO U/F) 32G X 4 MM MISC USE FOR INSULIN INJECTION FOUR TIMES A DAY 100 each 0  . MELATONIN PO Take by mouth.    . metFORMIN (GLUCOPHAGE) 1000 MG tablet TAKE 1 TABLET BY MOUTH TWICE DAILY WITH A MEAL 180 tablet 0  . Naproxen Sodium (ALEVE) 220 MG CAPS Take 2 capsules by mouth at bedtime as needed.      . Omega-3 Fatty Acids (FISH OIL) 1000 MG CAPS Take 1 capsule by mouth 2 (two) times daily.    . ONE TOUCH ULTRA TEST test strip TEST THREE TIMES DAILY 100 each 11  . SALINE NASAL SPRAY NA Place into the nose daily as needed.    . Semaglutide,0.25 or 0.5MG /DOS, (OZEMPIC, 0.25 OR  0.5 MG/DOSE,) 2 MG/1.5ML SOPN Inject 0.25 mg into the skin once a week. 1 pen 0  . tamsulosin (FLOMAX) 0.4 MG CAPS capsule Take 2 capsules (0.8 mg total) by mouth at bedtime. 180 capsule 0  . vitamin B-12 (CYANOCOBALAMIN) 1000 MCG tablet Take 2,000 mcg by mouth daily.     . Vitamin D, Ergocalciferol, (DRISDOL) 1.25 MG (50000 UNIT) CAPS capsule Take 1 capsule (50,000 Units total) by mouth every 7 (seven) days. 4 capsule 0   No current facility-administered medications for this visit.    Family History  Problem Relation Age of Onset  . Heart disease Mother   . Hyperlipidemia Mother   . Stroke Mother   . Diabetes Mother   . Depression Mother   . Alzheimer's disease Mother   . Stroke  Father   . Hyperlipidemia Father   . Heart disease Father   . Diabetes Father   . Hypertension Father   . Obesity Father   . Alcohol abuse Sister   . Depression Sister     There is a family history of dementia. He recalls taking his mother to a Neuropsychologist, she started having problems in her mid 65s and it was either "Alzheimer's or dementia." He doesn't recall anyone else with a diagnosis of dementia in his family. There is a family history of psychiatric illness, his mother was depressed most of her life. He stated that his sister has a history of alcoholism and depression.   Psychosocial History  Developmental, Educational and Employment History: The patient reported a normal childhood development. He stated that he did well in school, did not have any learning difficulties, although he was held back an extra semester in kindergarten related to social issues. He went to college at [employer] in [place], he also did law school there and graduated in the top 1/4 to 1/3 of his class. 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   Psychiatric History: The patient has a history of chronic psychiatric issues, since the 25s. 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 He was going through a divorce, having issues with his children (drugs), and was  intermittently involved in psychiatric treatment from the 1980s until the early 2000s. He reported that he has been fairly stable for the past 10 years. He takes Lexapro and Wellbutrin but hasn't been actively engaged in psychiatric care for some time. He has been psychiatrically hospitalized, for only two days, in the early 1990s related to suicidal ideation.   Substance Use History: The patient doesn't currently consume any significant amount of alcohol. He smoked on and off previously during his teenage years but does not smoke now. He doesn't use any drugs.   Relationship History and Living Cimcumstances: The patient and his wife have been married for 25 years, it is his second wife. He had three sons, one of whom unfortunately died of an overdose several years ago. His other sons are alive although it sounds like they sided with his ex-wife in the divorce and so they haven't been particularly close.   Mental Status and Behavioral Observations  Sensorium/Arousal: The patient's level of arousal was awake and alert.  Orientation: [name] [name] was fully oriented to person, place, time, and was well grounded in situation.  Appearance: Dressed appropriately in casual clothing with adequate grooming and hygiene. Behavior: Pleasant, appropriate, interactive, and capable of providing a reasonably detailed personal history and timeline that seemed objective when cross referenced with available information.  Speech/language: Speech was normal in articulation, rate, rhythm, and volume.  Gait/Posture: Gait appeared normal, narrow  based, and stable on observation of ambulation in the clinic.  Movement: No tremors, adventitious movements, or other over signs/symptoms of movement disorder visible on observation.  Social Comportment: Pleasant, appropriate, actively participated in the encounter.  Mood: "Pretty good" Affect: Euthymic Thought process/content: [name] [name] thought process was logical, linear, and goal-oriented. He was able to provide a reasonably concise and detailed personal  history.  Safety: Any thoughts of harming self or others were denied on direct questioning.  Insight: Atlee Abide Cognitive Assessment  02/14/2020  Visuospatial/ Executive (0/5) 4  Naming (0/3) 3  Attention: Read list of digits (0/2) 2  Attention: Read list of letters (0/1) 1  Attention: Serial 7 subtraction starting at 100 (0/3) 3  Language: Repeat phrase (0/2) 2  Language : Fluency (0/1) 1  Abstraction (0/2) 1  Delayed Recall (0/5) 0  Orientation (0/6) 6  Total 23  Adjusted Score (based on education) 23   Test Procedures  Wide Range Achievement Test - 4             Word Reading Doy Mince' Intellectual Screening Test Neuropsychological Assessment Battery  List Learning  Story Learning  Daily Living Memory  Naming  Digit Span Repeatable Battery for the Assessment of Neuropsychological Status (Form A)  Figure Copy  Judgment of Line Orientation  Coding  Figure Recall The Dot Counting Test A Random Letter Test Controlled Oral Word Association (F-A-S) Semantic Fluency (Animals) Trail Making Test A & B Complex Ideational Material Geriatric Depression Scale - Short Form  Plan  [name] [name] was seen for a psychiatric diagnostic evaluation and neuropsychological testing. Full and complete note with impressions, recommendations, and interpretation of test data to follow.   [name] [name] [name] Kindred, PsyD, [place] Clinical Neuropsychologist  Informed Consent and Coding/Compliance  Risks and benefits of the evaluation  were discussed with the patient as were the limits of confidentiality. I conducted a clinical interview and neuropsychological testing (more than two tests) with Margarita Rana and Lamar Benes, B.S. (Technician) assisted me in administering additional test procedures. The patient was able to tolerate the testing procedures and the patient (and/or family if applicable) is likely to benefit from further follow up to receive the diagnosis and treatment recommendations, which will be rendered at the next encounter. Billing below reflects technician time, my direct face-to-face time with the patient, time spent in test administration, and time spent in professional activities including but not limited to: neuropsychological test interpretation, integration of neuropsychological test data with clinical history, report preparation, treatment planning, care coordination, and review of diagnostically pertinent medical history or studies.   Services associated with this encounter: Clinical Interview [phone]) plus 60 minutes [id number]; Neuropsychological Evaluation by Professional)  120 minutes [id number]; Neuropsychological Evaluation by Professional, Adl.) 30 minutes [id number]; Neuropsychological Testing by Technician) 85 minutes [id number]; Neuropsychological Testing by Technician, Adl.)

## 2020-02-16 NOTE — Progress Notes (Signed)
[facility]  Patient Name: [name] [name] MRN: [id number] Date of Birth: [birth year] Age: 69 y.o. Education: 19 years  Measurement properties of test scores: IQ, Index, and Standard Scores (SS): Mean = 100; Standard Deviation = 15 Scaled Scores (Ss): Mean = 10; Standard Deviation = 3 Z scores (Z): Mean = 0; Standard Deviation = 1 T scores (T); Mean = 50; Standard Deviation = 10  TEST SCORES:    Note: This summary of test scores accompanies the interpretive report and should not be considered in isolation without reference to the appropriate sections in the text. Test scores are relative to age, gender, and educational history as available and appropriate.   Performance Validity        A Random Letter Test Raw  Descriptor      Errors 0 Within Expectation  The Dot Counting Test: 10 Within Expectation      Mental Status Screening     Total Score Descriptor  MoCA 23 Mild Cognitive Impairment  MMSE        Expected Functioning        Wide Range Achievement Test: Standard/Scaled Score Percentile      Word Reading 126 96      Reynolds Intellectual Screening Test Standard/T-score Percentile      Guess What 66 95      Odd Item Out 55 70  RIST Index 120 91      Attention/Processing Speed        Neuropsychological Assessment Battery (Attention Module, Form 1): T-score Percentile      Digits Forward 71 98      Digits Backwards 45 31      Repeatable Battery for the Assessment of Neuropsychological Status (Form A): Scaled Score Percentile      Coding 7 16      Language        Neuropsychological Assessment Battery (Language Module, Form 1): T-score Percentile      Naming 55 69      Verbal Fluency: T-score Percentile      Controlled Oral Word Association (F-A-S) 71 98      Semantic Fluency (Animals) 48 42      Memory:        Neuropsychological Assessment Battery (Memory Module, Form 1): T-score Percentile      List Learning         List A Immediate Recall   (6, 8, 10) 51 54         List B Immediate Recall   (5) 54 66         List A Short Delayed Recall   (9) 57 75         List A Long Delayed Recall   (8) 54 66         List A Long Delayed Yes/No Recognition Hits   (12) -- 79         List A Long Delayed Yes/No Recognition False Alarms   (2) -- 66         List A Recognition Discriminability Index -- 75     Story Learning           Immediate Recall   (15, 32) 36 8         Delayed Recall   (32) 49 46      Daily Living Memory            Immediate Recall   (24, 23) 60 84  Delayed Recall   (8, 7) 54 66          Recognition Hits -- 58      Repeatable Battery for the Assessment of Neuropsychological Status (Form A): Scaled Score Percentile         Figure Recall   (13) 10 50      Visuospatial/Constructional Functioning        Repeatable Battery for the Assessment of Neuropsychological Status (Form A): Standard/Scaled Score Percentile     Visuospatial/Constructional Index 112 79         Figure Copy 14 91         Judgment of Line Orientation --- 51-75      Executive Functioning        [employer] Test (MWCST): Standard/T-Score Percentile      Number of Categories Correct 36 9      Number of Perseverative Errors 49 47      Number of Total Errors 44 28      Percent Perseverative Errors 53 62  Executive Function Composite 88 22      Trail Making Test: T-Score Percentile      Part A 40 16      Part B 50 50      [place] Diagnostic Aphasia Exam: Raw Score Scaled Score      Complex Ideational Material 12 12      Clock Drawing Raw Score Descriptor      Command 10 999      Rating Scales         Raw Score Descriptor  Geriatric Depression Scale - Short Form 4 Negative    Peter V. Nicole Kindred PsyD, [place] Clinical Neuropsychologist

## 2020-02-19 DIAGNOSIS — M545 Low back pain: Secondary | ICD-10-CM | POA: Diagnosis not present

## 2020-02-19 DIAGNOSIS — M5416 Radiculopathy, lumbar region: Secondary | ICD-10-CM | POA: Diagnosis not present

## 2020-02-19 DIAGNOSIS — M6281 Muscle weakness (generalized): Secondary | ICD-10-CM | POA: Diagnosis not present

## 2020-02-19 NOTE — Progress Notes (Signed)
[facility]  Patient Name: [name] [name] MRN: [id number] Date of Birth: 20-Feb-1951 Age: 69 y.o. Education: 34 years  Clinical Impressions  [name] [name] is a 69 y.o., right-hand dominant, married man with a history of OSA (on CPAP) and subjective cognitive changes over about the past 2 to 3 years. He notices minor issues, for instance he has gotten disoriented in familiar areas while driving on a few occasions and also has a hard time remembering names. Sometimes he forgets details but there is no rapid forgetting or forgetting of entire events. He has no neuroimaging at present and his elemental neurological exam was normal albeit there was evidence of some neuropathy. On neuropsychological testing, there is suggestion of high average to superior overall intellectual ability. Compared to that stringent standard, it is possible that the very few weak scores [name] [name] demonstrated are representative of some mild diminishment, mostly with respect to executive functioning and speed of processing. With that said the profile is not clearly abnormal and overall his performance was quite good. Measures of memory showed good acquisition and retention of information across time. He screened negative for the presence of depression.   The overall impression is that of essentially normal neuropsychological performance. It is possible that [name] [name] is sensitive to normal age-related cognitive decline or has some very mild cognitive diminishment that is not showing on testing related to very high premorbid ability, although an impression of clear or significant impairment is not warranted given his test data.   Diagnostic Impressions: Cognitive complaints with normal neuropsychological exam Major depressive disorder, recurrent, mild (by history)  Recommendations to be discussed with patient  Your performance and presentation on assessment today  were consistent with essentially normal performance. You did demonstrate very high levels of overall cognitive ability and thus it is possible that you have some subtle mild diminishment of which you are aware that is not being picked up by the test data. Nevertheless, the data you produced are not clearly abnormal. Normal neuropsychological test performance does not rule out the possibility of some mild decline. It does provide strong evidence against dementia or any significant level of impairment.   Some cognitive abilities (such as processing speed) naturally decline with age. I wonder if you are simply very sensitive to normal, age-related cognitive changes and thus perceive these normal changes as signs that you are declining. Often times, individuals with family experience with dementia are particularly aware of and attuned to cognitive changes and worry that they see their demented loved one's behaviors in themselves. I would like to reassure you that your mother getting dementia does not necessarily place you at higher risk for developing dementia yourself. Most individuals with family members who live into old age will have family members who develop dementia.   Avoid overfocusing on cognitive performance. Memory and cognition are notoriously fallible and if you are looking for cognitive problems, you are bound to find them. Once someone gets worried about their memory and thinking, they may overfocus on how they are doing day-to-day, and then when normal day-to-day cognitive errors are made, this becomes a cause for more concern. This concern and anxiety then decreases focus from the task at hand, reducing concentration, causing more cognitive problems, and creating a vicious cycle. Rather than critiquing your performance, I would encourage you to remain present minded and focus on the task at hand. Perhaps most importantly, have reasonable expectations for yourself.  Healthy people forget things,  lose  focus, and do not perform 100% correctly all the time. Some cognitive errors are normal and are not necessarily a sign that there is something wrong with your brain.   As a general recommendation, I would suggest that you engage in health lifestyle behaviors including getting recommended (usually 8 hours) amounts of sleep, eating a healthy diet rich in unprocessed foods such as vegetables and whole grains, assertively monitor and manage underlying medical conditions that can contribute to cognitive impairment (e.g., hypertension, high cholesterol, etc.), getting regular exercise (preferably 20-30 minutes a day), and engaging in satisfying social interactions with others.   Test Findings  Test scores are summarized in additional documentation associated with this encounter. Test scores are relative to age, gender, and educational history as available and appropriate. There were no concerns about performance validity as all findings fell within normal expectations.   General Intellectual Functioning/Achievement:  Performance on measures of overall intellectual ability was very good with word reading falling in the superior range and performance on the RIST index at the margin of the superior and high average ranges. Superior ability was demonstrated on the more verbally oriented subtest and average performance was demonstrated on the more visually oriented subtest. These findings suggest a very stringent standard of comparison is appropriate for [name] [name] cognitive test data.   Attention and Processing Efficiency: Performance on indicators of attention and processing efficiency was generally good. [name] [name] demonstrated very superior digit repetition forward. His digit repetition backward was average. Performance on timed indicators of processing efficiency was a bit weaker but was not clearly impaired, with low average score on timed number-symbol coding and on simple numeric sequencing.    Language: Performance on measures of language function was within normal limits and in fact very strong in the case of phonemic fluency, which was superior. Animal naming was average. Visual object confrontation naming was average, well within normal limits for this patient.   Visuospatial Function: Performance on visuospatial and constructional indicators fell at a good, high average level on the RBANS visuospatial constructional index. High average performance was observed on figure copy whereas judgment of angular line orientations was average to high average.  Learning and Memory: Performance on measures of learning and memory was good and the number of low subtest scores is not unusual. Good acquisition and retention of information across time was demonstrated for both verbal and nonverbal material.   In the verbal realm, [name] [name] immediate recall for material including a 12-item word list and brief daily-living type information was normal. He demonstrated good retention of information across time with average delayed recall performance on both tests. Delayed yes/no recognition discriminability for the words from the list was high average and recognition of the daily-living items was average. Performance was a bit weaker on immediate recall of a short story and fell in the low average range, although he retained the information well and demonstrated average delayed recall performance.   In the visual realm, delayed recall of a modestly complex figure stimulus was average.   Executive Functions: Performance was good on executive measures with normal scores in most areas. Performance fell at a low average level overall on the Modified [employer]. Alternating sequencing of numbers and letters of the alphabet was average. Generation of words in response to the letters F-A-S was superior and performance was errorless on a measure involving attending to and then reasoning  with verbally presented information. Clock drawing was within normal limits with good formation of the  face, number, and hand placement.   Rating Scale(s): [name] [name] screened negative for the presence of depression, consistent with his self-report of feeling fairly cheerful lately.   Viviano Simas Nicole Kindred PsyD, [place] Clinical Neuropsychologist

## 2020-02-21 DIAGNOSIS — M5416 Radiculopathy, lumbar region: Secondary | ICD-10-CM | POA: Diagnosis not present

## 2020-02-21 DIAGNOSIS — M6281 Muscle weakness (generalized): Secondary | ICD-10-CM | POA: Diagnosis not present

## 2020-02-21 DIAGNOSIS — M545 Low back pain: Secondary | ICD-10-CM | POA: Diagnosis not present

## 2020-02-22 ENCOUNTER — Encounter: Payer: Medicare Other | Admitting: Counselor

## 2020-02-26 DIAGNOSIS — M545 Low back pain: Secondary | ICD-10-CM | POA: Diagnosis not present

## 2020-02-26 DIAGNOSIS — M5416 Radiculopathy, lumbar region: Secondary | ICD-10-CM | POA: Diagnosis not present

## 2020-02-26 DIAGNOSIS — M6281 Muscle weakness (generalized): Secondary | ICD-10-CM | POA: Diagnosis not present

## 2020-02-27 ENCOUNTER — Ambulatory Visit (INDEPENDENT_AMBULATORY_CARE_PROVIDER_SITE_OTHER): Payer: Medicare Other | Admitting: Physician Assistant

## 2020-02-27 ENCOUNTER — Other Ambulatory Visit: Payer: Self-pay

## 2020-02-27 MED ORDER — LEVEMIR FLEXTOUCH 100 UNIT/ML ~~LOC~~ SOPN: PEN_INJECTOR | SUBCUTANEOUS | 3 refills | Status: DC

## 2020-02-28 DIAGNOSIS — M6281 Muscle weakness (generalized): Secondary | ICD-10-CM | POA: Diagnosis not present

## 2020-02-28 DIAGNOSIS — M545 Low back pain: Secondary | ICD-10-CM | POA: Diagnosis not present

## 2020-02-28 DIAGNOSIS — M5416 Radiculopathy, lumbar region: Secondary | ICD-10-CM | POA: Diagnosis not present

## 2020-03-01 ENCOUNTER — Ambulatory Visit
Admission: RE | Admit: 2020-03-01 | Discharge: 2020-03-01 | Disposition: A | Discharge status: Home / Self Care | Payer: Medicare Other | Source: Ambulatory Visit | Attending: Neurology | Admitting: Neurology

## 2020-03-01 DIAGNOSIS — G3184 Mild cognitive impairment, so stated: Secondary | ICD-10-CM

## 2020-03-01 IMAGING — MR MR HEAD W/O CM
10 series · 48 of 48 positions shown · non-contrast
Comparison: None.

CLINICAL DATA: Mild cognitive impairment

EXAM:
MRI HEAD WITHOUT CONTRAST
TECHNIQUE: Multiplanar, multiecho pulse sequences of the brain and surrounding
structures were obtained without intravenous contrast.

[Series 2: t1_se_sag · sagittal · 5.0mm · 0.45mm/px · 2 of 23 slices shown]
[im 1/23]
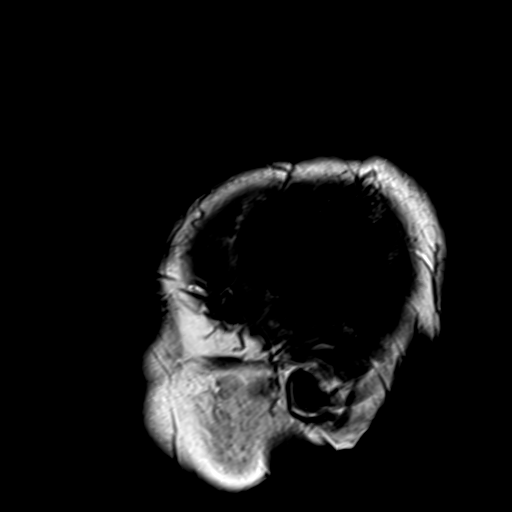
[im 23/23]
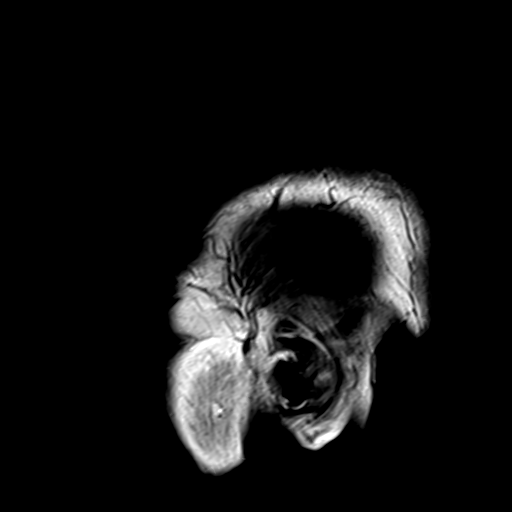

[Series 3: ep2d_diff_3 · axial · 3.0mm · 1.88mm/px · z∈[-67,+85]mm · 8 of 98 slices shown]
[im 1/98]
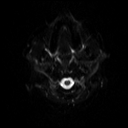
[im 14/98]
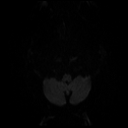
[im 28/98]
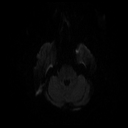
[im 42/98]
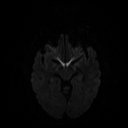
[im 56/98]
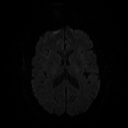
[im 70/98]
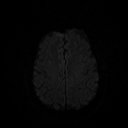
[im 84/98]
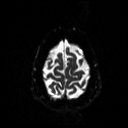
[im 98/98]
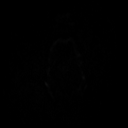

[Series 4: ep2d_diff_3_adc · axial · 3.0mm · 1.88mm/px · z∈[-67,+85]mm · 4 of 50 slices shown]
[im 1/50]
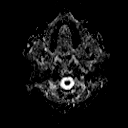
[im 17/50]
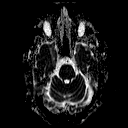
[im 33/50]
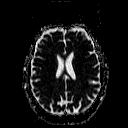
[im 50/50]
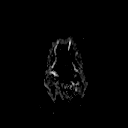

[Series 5: ep2d_diff_cor · coronal · 5.0mm · 1.77mm/px · 5 of 61 slices shown]
[im 1/61]
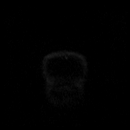
[im 16/61]
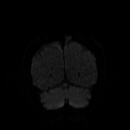
[im 31/61]
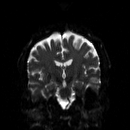
[im 46/61]
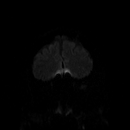
[im 61/61]
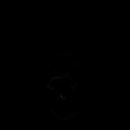

[Series 6: ep2d_diff_cor_adc · coronal · 5.0mm · 1.77mm/px · 3 of 31 slices shown]
[im 1/31]
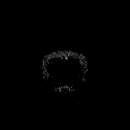
[im 16/31]
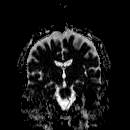
[im 31/31]
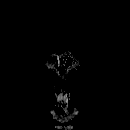

[Series 7: FLAIR · axial · 3.0mm · 0.45mm/px · z∈[-66,+83]mm · 2 of 26 slices shown]
[im 1/26]
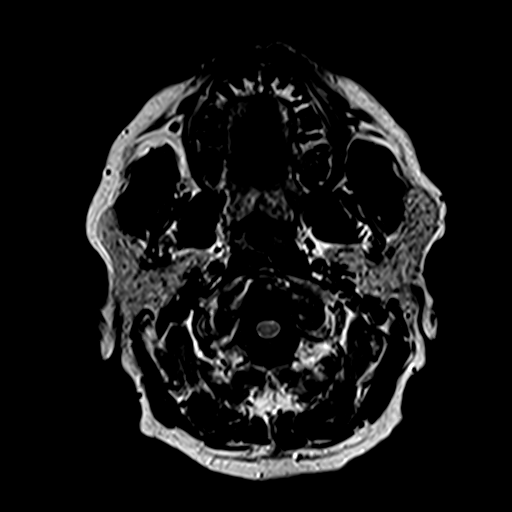
[im 26/26]
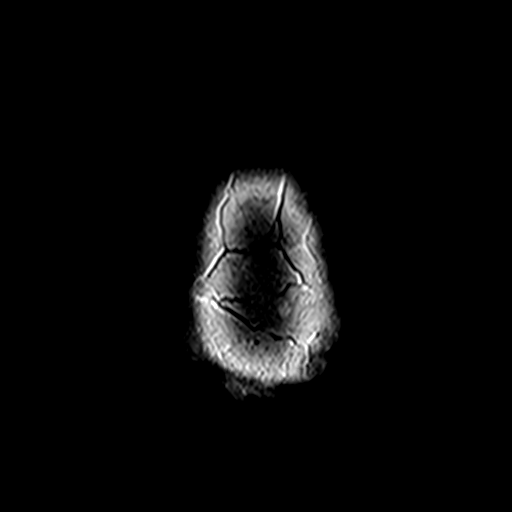

[Series 8: t2_tse_tra · axial · 5.0mm · 0.60mm/px · z∈[-59,+78]mm · 2 of 24 slices shown]
[im 1/24]
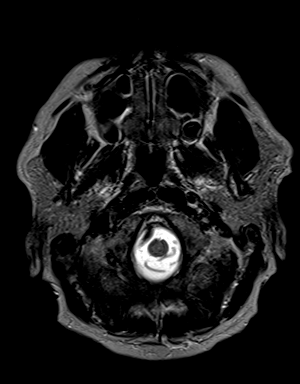
[im 24/24]
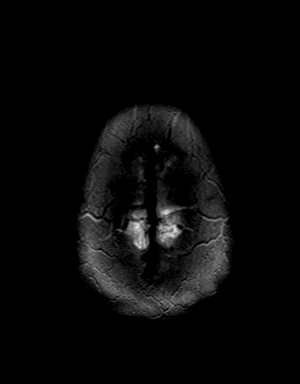

[Series 9: t1_mpr_tra · axial · 1.0mm · 0.75mm/px · z∈[-69,+89]mm · 13 of 160 slices shown]
[im 1/160]
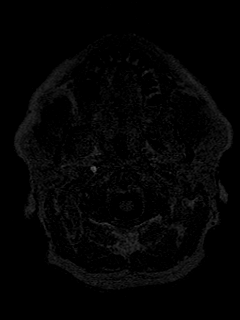
[im 14/160]
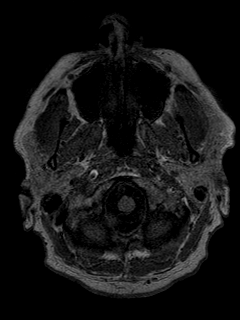
[im 27/160]
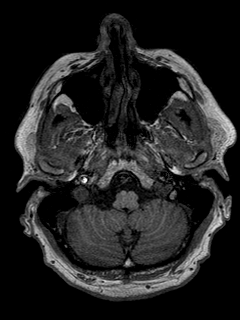
[im 40/160]
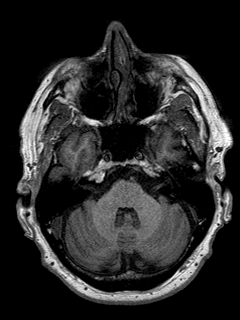
[im 54/160]
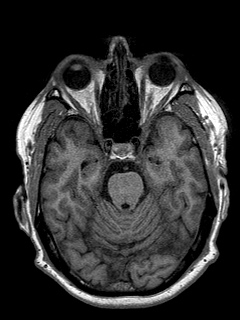
[im 67/160]
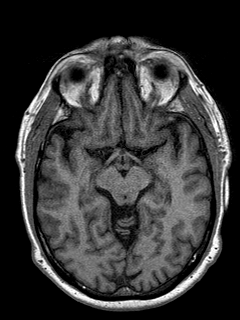
[im 80/160]
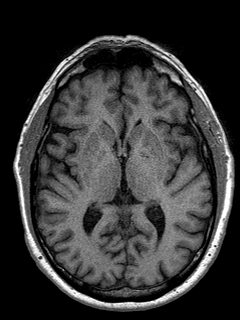
[im 93/160]
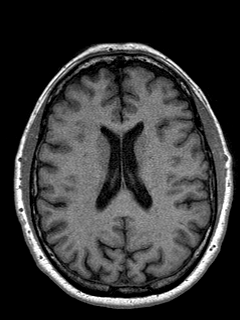
[im 107/160]
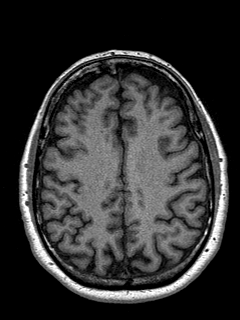
[im 120/160]
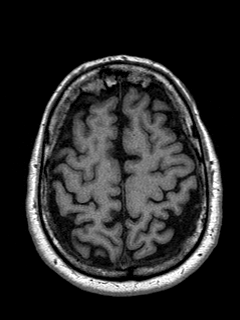
[im 133/160]
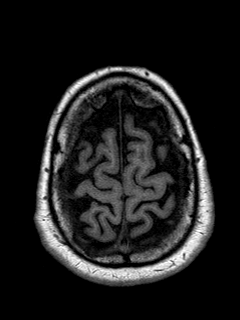
[im 146/160]
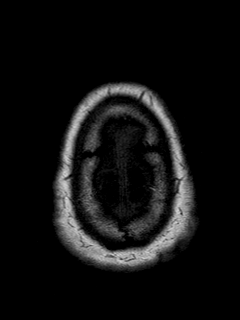
[im 160/160]
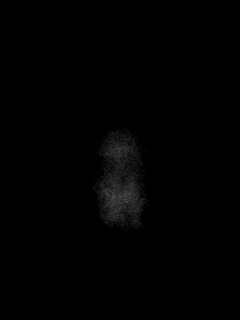

[Series 11: swi_images · axial · 2.0mm · 0.94mm/px · z∈[-69,+88]mm · 7 of 80 slices shown]
[im 1/80]
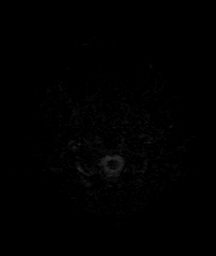
[im 14/80]
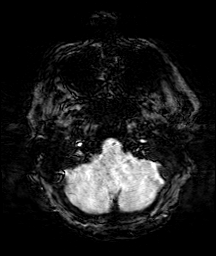
[im 27/80]
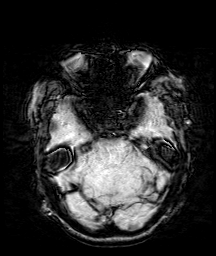
[im 40/80]
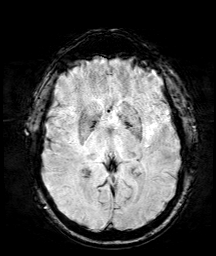
[im 53/80]
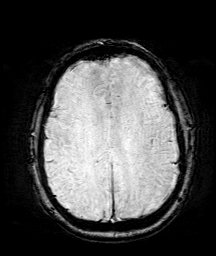
[im 66/80]
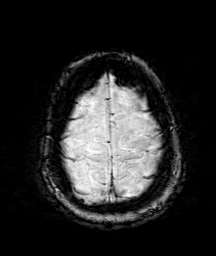
[im 80/80]
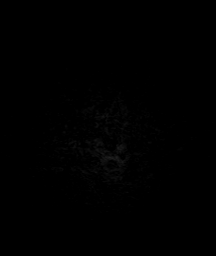

[Series 12: T2 · coronal · 5.0mm · 0.45mm/px · 2 of 30 slices shown]
[im 1/30]
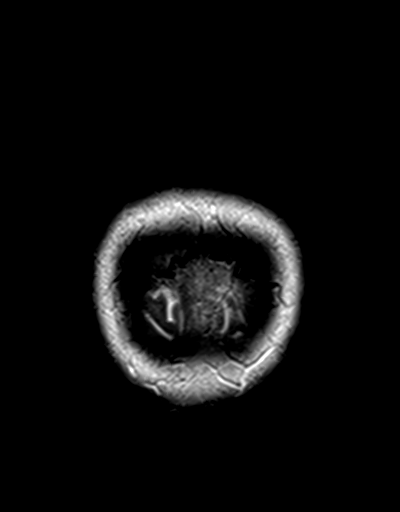
[im 30/30]
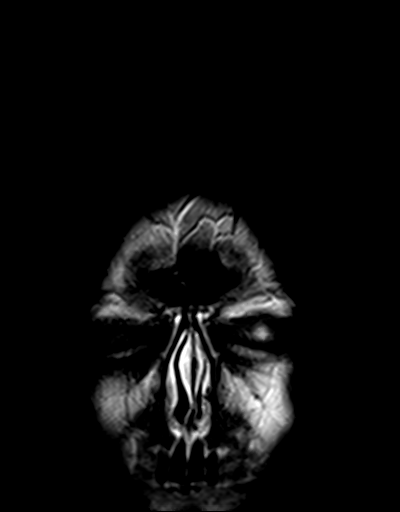

[48 of 48 positions shown; findings below may reference images not displayed]

FINDINGS: BRAIN: No acute infarct, acute hemorrhage or extra-axial collection.
Normal white matter signal for age. Normal volume of brain
parenchyma and CSF spaces. Midline structures are normal.

VASCULAR: Major flow voids are preserved. Susceptibility-sensitive
sequences show no chronic microhemorrhage or superficial siderosis.

SKULL AND UPPER CERVICAL SPINE: Normal calvarium and skull base.
Visualized upper cervical spine and soft tissues are normal.

SINUSES/ORBITS: No paranasal sinus fluid levels or advanced mucosal
thickening. No mastoid or middle ear effusion. Normal orbits.
IMPRESSION: Normal brain MRI.

## 2020-03-04 DIAGNOSIS — M5416 Radiculopathy, lumbar region: Secondary | ICD-10-CM | POA: Diagnosis not present

## 2020-03-04 DIAGNOSIS — M545 Low back pain: Secondary | ICD-10-CM | POA: Diagnosis not present

## 2020-03-04 DIAGNOSIS — M6281 Muscle weakness (generalized): Secondary | ICD-10-CM | POA: Diagnosis not present

## 2020-03-06 DIAGNOSIS — M5416 Radiculopathy, lumbar region: Secondary | ICD-10-CM | POA: Diagnosis not present

## 2020-03-06 DIAGNOSIS — M6281 Muscle weakness (generalized): Secondary | ICD-10-CM | POA: Diagnosis not present

## 2020-03-06 DIAGNOSIS — M545 Low back pain: Secondary | ICD-10-CM | POA: Diagnosis not present

## 2020-03-07 ENCOUNTER — Other Ambulatory Visit (INDEPENDENT_AMBULATORY_CARE_PROVIDER_SITE_OTHER): Payer: Self-pay | Admitting: Physician Assistant

## 2020-03-07 DIAGNOSIS — E559 Vitamin D deficiency, unspecified: Secondary | ICD-10-CM

## 2020-03-07 DIAGNOSIS — Z794 Long term (current) use of insulin: Secondary | ICD-10-CM

## 2020-03-07 DIAGNOSIS — F3289 Other specified depressive episodes: Secondary | ICD-10-CM

## 2020-03-07 DIAGNOSIS — E119 Type 2 diabetes mellitus without complications: Secondary | ICD-10-CM

## 2020-03-07 MED ORDER — OZEMPIC (0.25 OR 0.5 MG/DOSE) 2 MG/1.5ML ~~LOC~~ SOPN: 0.2500 mg | PEN_INJECTOR | SUBCUTANEOUS | 0 refills | Status: DC

## 2020-03-07 MED ORDER — BUPROPION HCL ER (SR) 150 MG PO TB12: 150.0000 mg | ORAL_TABLET | Freq: Every day | ORAL | 0 refills | Status: DC

## 2020-03-07 MED ORDER — VITAMIN D (ERGOCALCIFEROL) 1.25 MG (50000 UNIT) PO CAPS: 50000.0000 [IU] | ORAL_CAPSULE | ORAL | 0 refills | Status: DC

## 2020-03-07 MED ORDER — BD PEN NEEDLE NANO U/F 32G X 4 MM MISC: 0 refills | Status: DC

## 2020-03-08 ENCOUNTER — Ambulatory Visit (INDEPENDENT_AMBULATORY_CARE_PROVIDER_SITE_OTHER): Payer: Medicare Other | Admitting: Counselor

## 2020-03-08 ENCOUNTER — Encounter: Payer: Self-pay | Admitting: Counselor

## 2020-03-08 ENCOUNTER — Other Ambulatory Visit: Payer: Self-pay

## 2020-03-08 ENCOUNTER — Other Ambulatory Visit (INDEPENDENT_AMBULATORY_CARE_PROVIDER_SITE_OTHER): Payer: Self-pay | Admitting: Physician Assistant

## 2020-03-08 DIAGNOSIS — R419 Unspecified symptoms and signs involving cognitive functions and awareness: Secondary | ICD-10-CM

## 2020-03-08 DIAGNOSIS — E119 Type 2 diabetes mellitus without complications: Secondary | ICD-10-CM

## 2020-03-08 NOTE — Progress Notes (Signed)
[facility]  I met with Margarita Rana to review the findings resulting from his neuropsychological evaluation. He was attended by his wife, [name], and I was able to obtain additional history given some discrepancy between his test findings and the level of concern suggested at her visit with Dr. Delice Lesch. She reported that Gershon Mussel is quite functional although she has noticed "more than momentary" disorientation when driving and significant left/right confusion. He reads high level material including history and she feels like he is not quite as facile with it as in the past. She admits the changes are fairly subtle and that he has "always been three steps ahead" of her, so even mild changes seem very significant. She and the patient both have family experience with Alzheimer's.   I had a candid conversation with the Ricketts's about the patient's test data, impression of largely normal function as per testing, but that does not mean there are not subtle changes that are not neuropsychologically detectable at this point in time. I provided education about genetic liability for Alzheimer's and the fact that age is always the biggest risk factor unless there is a causative mutation, which is unlikely the case here. I encouraged them to remain watchful but not to overfocus on cognitive performance. We discussed diet, exercise, and the like as ways to promote cognitive longevity. Interventions provided during this encounter included reassurance, psychoeducation, and other topics as reflected in the patient instructions. I took time to explain the findings and answer all the patient's questions. I encouraged Mr. Arteaga to contact me should he have any further questions or if further follow up is desired.   Current Medications and Medical History   Current Outpatient Medications  Medication Sig Dispense Refill   aspirin 325 MG tablet Take 325 mg by mouth daily.        atorvastatin (LIPITOR) 80 MG tablet Take 1 tablet (80 mg total) by mouth daily. 90 tablet 0   buPROPion (WELLBUTRIN SR) 150 MG 12 hr tablet Take 1 tablet (150 mg total) by mouth daily. 60 tablet 0   Choline Fenofibrate (FENOFIBRIC ACID) 135 MG CPDR Take 1 capsule by mouth daily. 90 capsule 0   escitalopram (LEXAPRO) 20 MG tablet Take 1 tablet (20 mg total) by mouth daily. 90 tablet 0   finasteride (PROSCAR) 5 MG tablet Take 1 tablet (5 mg total) by mouth daily. 90 tablet 0   fluticasone (FLONASE) 50 MCG/ACT nasal spray Place 2 sprays into both nostrils daily. 48 g 1   gabapentin (NEURONTIN) 300 MG capsule Take 300 mg by mouth 3 (three) times daily.     insulin detemir (LEVEMIR FLEXTOUCH) 100 UNIT/ML FlexPen 56 U SQ once every day 15 mL 3   Insulin Pen Needle (BD PEN NEEDLE NANO U/F) 32G X 4 MM MISC USE FOR INSULIN INJECTION FOUR TIMES A DAY 100 each 0   MELATONIN PO Take by mouth.     metFORMIN (GLUCOPHAGE) 1000 MG tablet TAKE 1 TABLET BY MOUTH TWICE DAILY WITH A MEAL 180 tablet 0   Naproxen Sodium (ALEVE) 220 MG CAPS Take 2 capsules by mouth at bedtime as needed.       Omega-3 Fatty Acids (FISH OIL) 1000 MG CAPS Take 1 capsule by mouth 2 (two) times daily.     ONE TOUCH ULTRA TEST test strip TEST THREE TIMES DAILY 100 each 11   SALINE NASAL SPRAY NA Place into the nose daily as needed.     Semaglutide,0.25 or  0.5MG/DOS, (OZEMPIC, 0.25 OR 0.5 MG/DOSE,) 2 MG/1.5ML SOPN Inject 0.25 mg into the skin once a week. 1 pen 0   tamsulosin (FLOMAX) 0.4 MG CAPS capsule Take 2 capsules (0.8 mg total) by mouth at bedtime. 180 capsule 0   vitamin B-12 (CYANOCOBALAMIN) 1000 MCG tablet Take 2,000 mcg by mouth daily.      Vitamin D, Ergocalciferol, (DRISDOL) 1.25 MG (50000 UNIT) CAPS capsule Take 1 capsule (50,000 Units total) by mouth every 7 (seven) days. 2 capsule 0   No current facility-administered medications for this visit.    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 There are also no areas of concerning or significant leukoaraiosis. This is a normal study.   Mental Status and Behavioral Observations  Mr. Redfield was available at the prespectified time for this telephonic appointment and was attended by his wife. He was alert and fully oriented. His speech was clear. His self-reported mood was "pretty good" and his affect was mainly euthymic. His thought process was logical, linear, and goal-directed and his thought content was appropriate. No safety concerns such as thoughts of harming self or others were identified.  Plan  Feedback provided regarding the patient's neuropsychological evaluation. As per Neuropsychological assessment he is doing quite well; his wife has noticed subtle changes but is not terribly concerned. They do both have family history of AD and may be very attune to cognitive changes. They will work on optimizing diet, lifestyle, and cognitive longevity and presented as reassured by the findings.  Yuriy Cui was encouraged to contact me if any  questions arise or if further follow up is desired.   Viviano Simas Nicole Kindred, PsyD, [employer] Clinical Neuropsychologist  Service(s) Provided at This Encounter: 50 minutes [phone]; Conjoint therapy with patient present)

## 2020-03-08 NOTE — Patient Instructions (Signed)
Your performance and presentation on assessment today were consistent with essentially normal performance. You did demonstrate very high levels of overall cognitive ability and thus it is possible that you have some subtle mild diminishment of which you are aware that is not being picked up by the test data. Nevertheless, the data you produced are not clearly abnormal. Normal neuropsychological test performance does not rule out the possibility of some mild decline. It does provide strong evidence against dementia or any significant level of impairment.   Some cognitive abilities (such as processing speed) naturally decline with age. I wonder if you are simply very sensitive to normal, age-related cognitive changes and thus perceive these normal changes as signs that you are declining. Often times, individuals with family experience with dementia are particularly aware of and attuned to cognitive changes and worry that they see their demented loved one's behaviors in themselves. I would like to reassure you that your mother getting dementia does not necessarily place you at higher risk for developing dementia yourself. Most individuals with family members who live into old age will have family members who develop dementia.   Avoid overfocusing on cognitive performance. Memory and cognition are notoriously fallible and if you are looking for cognitive problems, you are bound to find them. Once someone gets worried about their memory and thinking, they may overfocus on how they are doing day-to-day, and then when normal day-to-day cognitive errors are made, this becomes a cause for more concern. This concern and anxiety then decreases focus from the task at hand, reducing concentration, causing more cognitive problems, and creating a vicious cycle. Rather than critiquing your performance, I would encourage you to remain present minded and focus on the task at hand. Perhaps most importantly, have reasonable  expectations for yourself.  Healthy people forget things, lose focus, and do not perform 100% correctly all the time. Some cognitive errors are normal and are not necessarily a sign that there is something wrong with your brain.   As a general recommendation, I would suggest that you engage in health lifestyle behaviors including getting recommended (usually 8 hours) amounts of sleep, eating a healthy diet rich in unprocessed foods such as vegetables and whole grains, assertively monitor and manage underlying medical conditions that can contribute to cognitive impairment (e.g., hypertension, high cholesterol, etc.), getting regular exercise (preferably 20-30 minutes a day), and engaging in satisfying social interactions with others.

## 2020-03-11 DIAGNOSIS — M6281 Muscle weakness (generalized): Secondary | ICD-10-CM | POA: Diagnosis not present

## 2020-03-11 DIAGNOSIS — M545 Low back pain: Secondary | ICD-10-CM | POA: Diagnosis not present

## 2020-03-11 DIAGNOSIS — M5416 Radiculopathy, lumbar region: Secondary | ICD-10-CM | POA: Diagnosis not present

## 2020-03-13 DIAGNOSIS — M6281 Muscle weakness (generalized): Secondary | ICD-10-CM | POA: Diagnosis not present

## 2020-03-13 DIAGNOSIS — M545 Low back pain: Secondary | ICD-10-CM | POA: Diagnosis not present

## 2020-03-13 DIAGNOSIS — M5416 Radiculopathy, lumbar region: Secondary | ICD-10-CM | POA: Diagnosis not present

## 2020-03-14 DIAGNOSIS — H16223 Keratoconjunctivitis sicca, not specified as Sjogren's, bilateral: Secondary | ICD-10-CM | POA: Diagnosis not present

## 2020-03-14 DIAGNOSIS — Z947 Corneal transplant status: Secondary | ICD-10-CM | POA: Diagnosis not present

## 2020-03-14 DIAGNOSIS — H18612 Keratoconus, stable, left eye: Secondary | ICD-10-CM | POA: Diagnosis not present

## 2020-03-14 DIAGNOSIS — H11153 Pinguecula, bilateral: Secondary | ICD-10-CM | POA: Diagnosis not present

## 2020-03-14 DIAGNOSIS — H18413 Arcus senilis, bilateral: Secondary | ICD-10-CM | POA: Diagnosis not present

## 2020-03-14 DIAGNOSIS — H2511 Age-related nuclear cataract, right eye: Secondary | ICD-10-CM | POA: Diagnosis not present

## 2020-03-14 DIAGNOSIS — Z961 Presence of intraocular lens: Secondary | ICD-10-CM | POA: Diagnosis not present

## 2020-03-18 ENCOUNTER — Other Ambulatory Visit: Payer: Self-pay

## 2020-03-18 ENCOUNTER — Ambulatory Visit (INDEPENDENT_AMBULATORY_CARE_PROVIDER_SITE_OTHER): Payer: Medicare Other | Admitting: Physician Assistant

## 2020-03-18 VITALS — BP 106/61 | HR 83 | Temp 98.5°F | Ht 70.0 in | Wt 233.0 lb

## 2020-03-18 DIAGNOSIS — Z6833 Body mass index (BMI) 33.0-33.9, adult: Secondary | ICD-10-CM

## 2020-03-18 DIAGNOSIS — I10 Essential (primary) hypertension: Secondary | ICD-10-CM

## 2020-03-18 DIAGNOSIS — E669 Obesity, unspecified: Secondary | ICD-10-CM

## 2020-03-18 DIAGNOSIS — M6281 Muscle weakness (generalized): Secondary | ICD-10-CM | POA: Diagnosis not present

## 2020-03-18 DIAGNOSIS — E1159 Type 2 diabetes mellitus with other circulatory complications: Secondary | ICD-10-CM | POA: Diagnosis not present

## 2020-03-18 DIAGNOSIS — E7849 Other hyperlipidemia: Secondary | ICD-10-CM | POA: Diagnosis not present

## 2020-03-18 DIAGNOSIS — M545 Low back pain: Secondary | ICD-10-CM | POA: Diagnosis not present

## 2020-03-18 DIAGNOSIS — M5416 Radiculopathy, lumbar region: Secondary | ICD-10-CM | POA: Diagnosis not present

## 2020-03-18 NOTE — Progress Notes (Signed)
Chief Complaint:   OBESITY [name] [name] is here to discuss his progress with his obesity treatment plan along with follow-up of his obesity related diagnoses. [name] [name] is on the Category 4 Plan and states he is following his eating plan approximately 50% of the time. [name] [name] states he is doing Physical Therapy 45 minutes 2 times per week.  Today's visit was #: 61 Starting weight: 272 lbs Starting date: 02/14/2018 Today's weight: 233 lbs Today's date: 03/18/2020 Total lbs lost to date: 39 Total lbs lost since last in-office visit: 0  Interim History: [name] [name] states that his back has been hurting a lot and he has been snacking more than normal. His kitchen renovation will be starting in about 2 weeks.  Subjective:   Hypertension associated with diabetes ([place]). [name] is on insulin, metformin, and Ozempic. No hypoglycemia. He reports polyphagia. Last A1c 5.8 on 12/12/2019.  BP Readings from Last 3 Encounters:  03/18/20 106/61  02/12/20 (!) 104/52  02/07/20 106/62   Lab Results  Component Value Date   CREATININE 0.95 12/12/2019   CREATININE 0.92 08/22/2019   CREATININE 0.95 02/14/2019   Other hyperlipidemia. Makell is on atorvastatin and Omega-3. No chest pain or myalgias.   Lab Results  Component Value Date   CHOL 130 12/12/2019   HDL 48 12/12/2019   LDLCALC 68 12/12/2019   LDLDIRECT 112.0 07/31/2016   TRIG 69 12/12/2019   CHOLHDL 3 08/10/2018   Lab Results  Component Value Date   ALT 18 12/12/2019   AST 14 12/12/2019   ALKPHOS 34 (L) 12/12/2019   BILITOT 0.4 12/12/2019   The 10-year ASCVD risk score [name] Bussing DC Jr., et al., 2013) is: 15.6%   Values used to calculate the score:     Age: 89 years     Sex: Male     Is Non-Hispanic African American: No     Diabetic: Yes     Tobacco smoker: No     Systolic Blood Pressure: [id number] mmHg     Is BP treated: No     HDL Cholesterol: 48 mg/dL     Total Cholesterol: 130 mg/dL  Assessment/Plan:   Hypertension associated  with diabetes ([place]). Nichol is working on healthy weight loss and exercise to improve blood pressure control. We will watch for signs of hypotension as he continues his lifestyle modifications. He will increase Ozempic to 0.5 mg daily #5 pens.  Other hyperlipidemia. Cardiovascular risk and specific lipid/LDL goals reviewed.  We discussed several lifestyle modifications today and Ackeem will continue to work on diet, exercise and weight loss efforts. Orders and follow up as documented in patient record. He will continue his medications as directed.  Counseling Intensive lifestyle modifications are the first line treatment for this issue. . Dietary changes: Increase soluble fiber. Decrease simple carbohydrates. . Exercise changes: Moderate to vigorous-intensity aerobic activity 150 minutes per week if tolerated. . Lipid-lowering medications: see documented in medical record.  Class 1 obesity with serious comorbidity and body mass index (BMI) of 33.0 to 33.9 in adult, unspecified obesity type.  [name] [name] is currently in the action stage of change. As such, his goal is to continue with weight loss efforts. He has agreed to the Category 4 Plan.   Exercise goals: Older adults should follow the adult guidelines. When older adults cannot meet the adult guidelines, they should be as physically active as their abilities and conditions will allow.   Behavioral modification strategies: meal planning and cooking strategies and emotional eating strategies.  [name] has agreed to follow-up with our clinic in 4 weeks. He was informed of the importance of frequent follow-up visits to maximize his success with intensive lifestyle modifications for his multiple health conditions.   Objective:   Blood pressure 106/61, pulse 83, temperature 98.5 F (36.9 C), temperature source Oral, height 5\' 10"  (1.778 m), weight 233 lb (105.7 kg), SpO2 95 %. Body mass index is 33.43 kg/m.  General: Cooperative, alert, well  developed, in no acute distress. HEENT: Conjunctivae and lids unremarkable. Cardiovascular: Regular rhythm.  Lungs: Normal work of breathing. Neurologic: No focal deficits.   Lab Results  Component Value Date   CREATININE 0.95 12/12/2019   BUN 22 12/12/2019   NA 138 12/12/2019   K 4.0 12/12/2019   CL 101 12/12/2019   CO2 24 12/12/2019   Lab Results  Component Value Date   ALT 18 12/12/2019   AST 14 12/12/2019   ALKPHOS 34 (L) 12/12/2019   BILITOT 0.4 12/12/2019   Lab Results  Component Value Date   HGBA1C 5.8 (H) 12/12/2019   HGBA1C 6.1 (H) 08/22/2019   HGBA1C 6.4 05/19/2019   HGBA1C 6.1 (H) 02/14/2019   HGBA1C 5.8 (A) 11/14/2018   No results found for: INSULIN Lab Results  Component Value Date   TSH 1.25 02/05/2020   Lab Results  Component Value Date   CHOL 130 12/12/2019   HDL 48 12/12/2019   LDLCALC 68 12/12/2019   LDLDIRECT 112.0 07/31/2016   TRIG 69 12/12/2019   CHOLHDL 3 08/10/2018   Lab Results  Component Value Date   WBC 7.2 02/14/2018   HGB 13.4 02/14/2018   HCT 39.3 02/14/2018   MCV 85 02/14/2018   PLT 211.0 05/21/2014   No results found for: IRON, TIBC, FERRITIN  Obesity Behavioral Intervention Documentation for Insurance:   Approximately 15 minutes were spent on the discussion below.  ASK: We discussed the diagnosis of obesity with [name] today and [name] agreed to give [name] permission to discuss obesity behavioral modification therapy today.  ASSESS: [name] [name] has the diagnosis of obesity and his BMI today is 33.5. Huynh is in the action stage of change.   ADVISE: [name] [name] was educated on the multiple health risks of obesity as well as the benefit of weight loss to improve his health. He was advised of the need for long term treatment and the importance of lifestyle modifications to improve his current health and to decrease his risk of future health problems.  AGREE: Multiple dietary modification options and treatment options were discussed  and [name] [name] agreed to follow the recommendations documented in the above note.  ARRANGE: [name] was educated on the importance of frequent visits to treat obesity as outlined per CMS and USPSTF guidelines and agreed to schedule his next follow up appointment today.  Attestation Statements:   Reviewed by clinician on day of visit: allergies, medications, problem list, medical history, surgical history, family history, social history, and previous encounter notes.  IMichaelene Song, am acting as transcriptionist for Abby Potash, PA-C   I have reviewed the above documentation for accuracy and completeness, and I agree with the above. Abby Potash, PA-C

## 2020-03-20 DIAGNOSIS — M545 Low back pain: Secondary | ICD-10-CM | POA: Diagnosis not present

## 2020-03-20 DIAGNOSIS — M6281 Muscle weakness (generalized): Secondary | ICD-10-CM | POA: Diagnosis not present

## 2020-03-20 DIAGNOSIS — M5416 Radiculopathy, lumbar region: Secondary | ICD-10-CM | POA: Diagnosis not present

## 2020-03-25 ENCOUNTER — Other Ambulatory Visit (INDEPENDENT_AMBULATORY_CARE_PROVIDER_SITE_OTHER): Payer: Self-pay

## 2020-03-25 ENCOUNTER — Encounter: Payer: Self-pay | Admitting: Family Medicine

## 2020-03-25 ENCOUNTER — Encounter (INDEPENDENT_AMBULATORY_CARE_PROVIDER_SITE_OTHER): Payer: Self-pay | Admitting: Physician Assistant

## 2020-03-25 DIAGNOSIS — E559 Vitamin D deficiency, unspecified: Secondary | ICD-10-CM

## 2020-03-25 DIAGNOSIS — Z794 Long term (current) use of insulin: Secondary | ICD-10-CM

## 2020-03-25 DIAGNOSIS — E119 Type 2 diabetes mellitus without complications: Secondary | ICD-10-CM

## 2020-03-25 MED ORDER — VITAMIN D (ERGOCALCIFEROL) 1.25 MG (50000 UNIT) PO CAPS: 50000.0000 [IU] | ORAL_CAPSULE | ORAL | 0 refills | Status: DC

## 2020-03-25 MED ORDER — OZEMPIC (0.25 OR 0.5 MG/DOSE) 2 MG/1.5ML ~~LOC~~ SOPN: 0.5000 mg | PEN_INJECTOR | SUBCUTANEOUS | 0 refills | Status: DC

## 2020-03-27 ENCOUNTER — Other Ambulatory Visit (INDEPENDENT_AMBULATORY_CARE_PROVIDER_SITE_OTHER): Payer: Self-pay | Admitting: Physician Assistant

## 2020-03-27 DIAGNOSIS — M5416 Radiculopathy, lumbar region: Secondary | ICD-10-CM | POA: Diagnosis not present

## 2020-03-27 DIAGNOSIS — M6281 Muscle weakness (generalized): Secondary | ICD-10-CM | POA: Diagnosis not present

## 2020-03-27 DIAGNOSIS — M545 Low back pain: Secondary | ICD-10-CM | POA: Diagnosis not present

## 2020-03-27 DIAGNOSIS — E559 Vitamin D deficiency, unspecified: Secondary | ICD-10-CM

## 2020-03-28 ENCOUNTER — Encounter: Payer: Self-pay | Admitting: Family Medicine

## 2020-03-28 MED ORDER — GLUCOSE BLOOD VI STRP: ORAL_STRIP | 11 refills | Status: DC

## 2020-04-01 DIAGNOSIS — M5416 Radiculopathy, lumbar region: Secondary | ICD-10-CM | POA: Diagnosis not present

## 2020-04-01 DIAGNOSIS — M545 Low back pain: Secondary | ICD-10-CM | POA: Diagnosis not present

## 2020-04-01 DIAGNOSIS — M6281 Muscle weakness (generalized): Secondary | ICD-10-CM | POA: Diagnosis not present

## 2020-04-03 DIAGNOSIS — M6281 Muscle weakness (generalized): Secondary | ICD-10-CM | POA: Diagnosis not present

## 2020-04-03 DIAGNOSIS — M5416 Radiculopathy, lumbar region: Secondary | ICD-10-CM | POA: Diagnosis not present

## 2020-04-03 DIAGNOSIS — M545 Low back pain: Secondary | ICD-10-CM | POA: Diagnosis not present

## 2020-04-05 ENCOUNTER — Other Ambulatory Visit: Payer: Self-pay

## 2020-04-05 MED ORDER — GLUCOSE BLOOD VI STRP: 1.0000 | ORAL_STRIP | Freq: Three times a day (TID) | 11 refills | Status: DC

## 2020-04-05 NOTE — Progress Notes (Signed)
Received fax that pharmacy needed RX for pts glucose testing strips. Printed and faxed to pharmacy.

## 2020-04-08 ENCOUNTER — Other Ambulatory Visit: Payer: Self-pay

## 2020-04-08 DIAGNOSIS — M6281 Muscle weakness (generalized): Secondary | ICD-10-CM | POA: Diagnosis not present

## 2020-04-08 DIAGNOSIS — M545 Low back pain: Secondary | ICD-10-CM | POA: Diagnosis not present

## 2020-04-08 DIAGNOSIS — M5416 Radiculopathy, lumbar region: Secondary | ICD-10-CM | POA: Diagnosis not present

## 2020-04-08 MED ORDER — FENOFIBRIC ACID 135 MG PO CPDR: 1.0000 | DELAYED_RELEASE_CAPSULE | Freq: Every day | ORAL | 0 refills | Status: DC

## 2020-04-08 MED ORDER — ATORVASTATIN CALCIUM 80 MG PO TABS: 80.0000 mg | ORAL_TABLET | Freq: Every day | ORAL | 0 refills | Status: DC

## 2020-04-08 MED ORDER — FINASTERIDE 5 MG PO TABS: 5.0000 mg | ORAL_TABLET | Freq: Every day | ORAL | 0 refills | Status: DC

## 2020-04-08 MED ORDER — METFORMIN HCL 1000 MG PO TABS: ORAL_TABLET | ORAL | 0 refills | Status: DC

## 2020-04-08 MED ORDER — TAMSULOSIN HCL 0.4 MG PO CAPS: 0.8000 mg | ORAL_CAPSULE | Freq: Every day | ORAL | 0 refills | Status: DC

## 2020-04-09 ENCOUNTER — Encounter: Payer: Self-pay | Admitting: Family Medicine

## 2020-04-10 DIAGNOSIS — M545 Low back pain: Secondary | ICD-10-CM | POA: Diagnosis not present

## 2020-04-10 DIAGNOSIS — M6281 Muscle weakness (generalized): Secondary | ICD-10-CM | POA: Diagnosis not present

## 2020-04-10 DIAGNOSIS — M5416 Radiculopathy, lumbar region: Secondary | ICD-10-CM | POA: Diagnosis not present

## 2020-04-15 ENCOUNTER — Encounter (INDEPENDENT_AMBULATORY_CARE_PROVIDER_SITE_OTHER): Payer: Self-pay | Admitting: Family Medicine

## 2020-04-15 ENCOUNTER — Ambulatory Visit (INDEPENDENT_AMBULATORY_CARE_PROVIDER_SITE_OTHER): Payer: Medicare Other | Admitting: Family Medicine

## 2020-04-15 ENCOUNTER — Other Ambulatory Visit: Payer: Self-pay

## 2020-04-15 VITALS — BP 95/58 | HR 114 | Temp 98.3°F | Ht 70.0 in | Wt 231.0 lb

## 2020-04-15 DIAGNOSIS — M545 Low back pain: Secondary | ICD-10-CM | POA: Diagnosis not present

## 2020-04-15 DIAGNOSIS — M6281 Muscle weakness (generalized): Secondary | ICD-10-CM | POA: Diagnosis not present

## 2020-04-15 DIAGNOSIS — Z6833 Body mass index (BMI) 33.0-33.9, adult: Secondary | ICD-10-CM

## 2020-04-15 DIAGNOSIS — E669 Obesity, unspecified: Secondary | ICD-10-CM

## 2020-04-15 DIAGNOSIS — E559 Vitamin D deficiency, unspecified: Secondary | ICD-10-CM

## 2020-04-15 DIAGNOSIS — F3289 Other specified depressive episodes: Secondary | ICD-10-CM

## 2020-04-15 DIAGNOSIS — M5416 Radiculopathy, lumbar region: Secondary | ICD-10-CM | POA: Diagnosis not present

## 2020-04-15 MED ORDER — BUPROPION HCL ER (SR) 150 MG PO TB12: 150.0000 mg | ORAL_TABLET | Freq: Two times a day (BID) | ORAL | 0 refills | Status: DC

## 2020-04-15 MED ORDER — VITAMIN D (ERGOCALCIFEROL) 1.25 MG (50000 UNIT) PO CAPS: 50000.0000 [IU] | ORAL_CAPSULE | ORAL | 0 refills | Status: DC

## 2020-04-16 ENCOUNTER — Encounter (INDEPENDENT_AMBULATORY_CARE_PROVIDER_SITE_OTHER): Payer: Self-pay | Admitting: Family Medicine

## 2020-04-16 DIAGNOSIS — F32A Depression, unspecified: Secondary | ICD-10-CM | POA: Insufficient documentation

## 2020-04-16 DIAGNOSIS — E559 Vitamin D deficiency, unspecified: Secondary | ICD-10-CM | POA: Insufficient documentation

## 2020-04-16 DIAGNOSIS — E669 Obesity, unspecified: Secondary | ICD-10-CM | POA: Insufficient documentation

## 2020-04-16 NOTE — Progress Notes (Signed)
Chief Complaint:   OBESITY [name] [name] is here to discuss his progress with his obesity treatment plan along with follow-up of his obesity related diagnoses. [name] [name] is on the Category 4 Plan and states he is following his eating plan approximately 80% of the time. [name] [name] states he is doing physical therapy for 45 minutes 2 times per week.  Today's visit was #: 13 Starting weight: 272 lbs Starting date: 02/14/2018 Today's weight: 231 lbs Today's date: 04/15/2020 Total lbs lost to date: 41 Total lbs lost since last in-office visit: 2  Interim History: [name] [name] notes cravings but not hunger. He is eating all of the protein on the plan. He admits to occasionally overeating extra calories.  Subjective:   1. Vitamin D deficiency [name] [name]'s last Vit D level was low at 35. He is on prescription Vit D.  2. Other depression, with emotional eating  [name] [name] is taking bupropion and he feels his cravings are easier to manage with this. His cravings are pronounced at night. He has been taking bupropion BID but his medication list says once daily. He has had issues with sleep related to restless leg syndrome.  Assessment/Plan:   1. Vitamin D deficiency Low Vitamin D level contributes to fatigue and are associated with obesity, breast, and colon cancer. He agrees to continue to take prescription Vitamin D @50 ,000 IU every week and will follow-up for routine testing of Vitamin D, at least 2-3 times per year to avoid over-replacement.  - Vitamin D, Ergocalciferol, (DRISDOL) 1.25 MG (50000 UNIT) CAPS capsule; Take 1 capsule (50,000 Units total) by mouth every 7 (seven) days.  Dispense: 4 capsule; Refill: 0  2. Other depression, with emotional eating  Behavior modification techniques were discussed today to help [name] [name] deal with his emotional/non-hunger eating behaviors. We will refill bupropion for 1 month. He will taking his second dose in the mid-afternoon to see if this is affecting his sleep.. Orders and  follow up as documented in patient record.   - buPROPion (WELLBUTRIN SR) 150 MG 12 hr tablet; Take 1 tablet (150 mg total) by mouth 2 (two) times daily.  Dispense: 60 tablet; Refill: 0  3. Class 1 obesity with serious comorbidity and body mass index (BMI) of 33.0 to 33.9 in adult, unspecified obesity type [name] [name] is currently in the action stage of change. As such, his goal is to continue with weight loss efforts. He has agreed to the Category 4 Plan.   Exercise goals: As is.  Behavioral modification strategies: decreasing simple carbohydrates.  [name] [name] has agreed to follow-up with our clinic in 4 weeks with [name] Potash, PA-C. He was informed of the importance of frequent follow-up visits to maximize his success with intensive lifestyle modifications for his multiple health conditions.   Objective:   Blood pressure (!) 95/58, pulse (!) 114, temperature 98.3 F (36.8 C), temperature source Oral, height 5\' 10"  (1.778 m), weight 231 lb (104.8 kg), SpO2 97 %. Body mass index is 33.15 kg/m.  General: Cooperative, alert, well developed, in no acute distress. HEENT: Conjunctivae and lids unremarkable. Cardiovascular: Regular rhythm.  Lungs: Normal work of breathing. Neurologic: No focal deficits.   Lab Results  Component Value Date   CREATININE 0.95 12/12/2019   BUN 22 12/12/2019   NA 138 12/12/2019   K 4.0 12/12/2019   CL 101 12/12/2019   CO2 24 12/12/2019   Lab Results  Component Value Date   ALT 18 12/12/2019   AST 14 12/12/2019   ALKPHOS 34 (L) 12/12/2019  BILITOT 0.4 12/12/2019   Lab Results  Component Value Date   HGBA1C 5.8 (H) 12/12/2019   HGBA1C 6.1 (H) 08/22/2019   HGBA1C 6.4 05/19/2019   HGBA1C 6.1 (H) 02/14/2019   HGBA1C 5.8 (A) 11/14/2018   No results found for: INSULIN Lab Results  Component Value Date   TSH 1.25 02/05/2020   Lab Results  Component Value Date   CHOL 130 12/12/2019   HDL 48 12/12/2019   LDLCALC 68 12/12/2019   LDLDIRECT 112.0  07/31/2016   TRIG 69 12/12/2019   CHOLHDL 3 08/10/2018   Lab Results  Component Value Date   WBC 7.2 02/14/2018   HGB 13.4 02/14/2018   HCT 39.3 02/14/2018   MCV 85 02/14/2018   PLT 211.0 05/21/2014   No results found for: IRON, TIBC, FERRITIN  Obesity Behavioral Intervention Documentation for Insurance:   Approximately 15 minutes were spent on the discussion below.  ASK: We discussed the diagnosis of obesity with [name] [name] today and [name] [name] agreed to give [name] permission to discuss obesity behavioral modification therapy today.  ASSESS: [name] [name] has the diagnosis of obesity and his BMI today is 33.15. [name] [name] is in the action stage of change.   ADVISE: [name] [name] was educated on the multiple health risks of obesity as well as the benefit of weight loss to improve his health. He was advised of the need for long term treatment and the importance of lifestyle modifications to improve his current health and to decrease his risk of future health problems.  AGREE: Multiple dietary modification options and treatment options were discussed and [name] [name] agreed to follow the recommendations documented in the above note.  ARRANGE: [name] [name] was educated on the importance of frequent visits to treat obesity as outlined per CMS and USPSTF guidelines and agreed to schedule his next follow up appointment today.  Attestation Statements:   Reviewed by clinician on day of visit: allergies, medications, problem list, medical history, surgical history, family history, social history, and previous encounter notes.   Wilhemena Durie, am acting as Location manager for [employer], FNP-C.  I have reviewed the above documentation for accuracy and completeness, and I agree with the above. -  Georgianne Fick, FNP

## 2020-04-17 DIAGNOSIS — M545 Low back pain: Secondary | ICD-10-CM | POA: Diagnosis not present

## 2020-04-17 DIAGNOSIS — M6281 Muscle weakness (generalized): Secondary | ICD-10-CM | POA: Diagnosis not present

## 2020-04-17 DIAGNOSIS — M5416 Radiculopathy, lumbar region: Secondary | ICD-10-CM | POA: Diagnosis not present

## 2020-04-22 DIAGNOSIS — M545 Low back pain: Secondary | ICD-10-CM | POA: Diagnosis not present

## 2020-04-22 DIAGNOSIS — M6281 Muscle weakness (generalized): Secondary | ICD-10-CM | POA: Diagnosis not present

## 2020-04-22 DIAGNOSIS — M5416 Radiculopathy, lumbar region: Secondary | ICD-10-CM | POA: Diagnosis not present

## 2020-04-25 DIAGNOSIS — M545 Low back pain: Secondary | ICD-10-CM | POA: Diagnosis not present

## 2020-04-25 DIAGNOSIS — M6281 Muscle weakness (generalized): Secondary | ICD-10-CM | POA: Diagnosis not present

## 2020-04-25 DIAGNOSIS — M5416 Radiculopathy, lumbar region: Secondary | ICD-10-CM | POA: Diagnosis not present

## 2020-04-30 ENCOUNTER — Other Ambulatory Visit (INDEPENDENT_AMBULATORY_CARE_PROVIDER_SITE_OTHER): Payer: Self-pay | Admitting: Physician Assistant

## 2020-04-30 DIAGNOSIS — M545 Low back pain: Secondary | ICD-10-CM | POA: Diagnosis not present

## 2020-04-30 DIAGNOSIS — E119 Type 2 diabetes mellitus without complications: Secondary | ICD-10-CM

## 2020-04-30 DIAGNOSIS — M6281 Muscle weakness (generalized): Secondary | ICD-10-CM | POA: Diagnosis not present

## 2020-04-30 DIAGNOSIS — M5416 Radiculopathy, lumbar region: Secondary | ICD-10-CM | POA: Diagnosis not present

## 2020-05-02 DIAGNOSIS — M6281 Muscle weakness (generalized): Secondary | ICD-10-CM | POA: Diagnosis not present

## 2020-05-02 DIAGNOSIS — M5416 Radiculopathy, lumbar region: Secondary | ICD-10-CM | POA: Diagnosis not present

## 2020-05-02 DIAGNOSIS — M545 Low back pain: Secondary | ICD-10-CM | POA: Diagnosis not present

## 2020-05-06 DIAGNOSIS — M545 Low back pain: Secondary | ICD-10-CM | POA: Diagnosis not present

## 2020-05-06 DIAGNOSIS — M6281 Muscle weakness (generalized): Secondary | ICD-10-CM | POA: Diagnosis not present

## 2020-05-06 DIAGNOSIS — M5416 Radiculopathy, lumbar region: Secondary | ICD-10-CM | POA: Diagnosis not present

## 2020-05-08 DIAGNOSIS — M5416 Radiculopathy, lumbar region: Secondary | ICD-10-CM | POA: Diagnosis not present

## 2020-05-08 DIAGNOSIS — M6281 Muscle weakness (generalized): Secondary | ICD-10-CM | POA: Diagnosis not present

## 2020-05-08 DIAGNOSIS — M545 Low back pain: Secondary | ICD-10-CM | POA: Diagnosis not present

## 2020-05-09 ENCOUNTER — Encounter (INDEPENDENT_AMBULATORY_CARE_PROVIDER_SITE_OTHER): Payer: Self-pay | Admitting: Physician Assistant

## 2020-05-09 ENCOUNTER — Other Ambulatory Visit: Payer: Self-pay

## 2020-05-09 ENCOUNTER — Ambulatory Visit (INDEPENDENT_AMBULATORY_CARE_PROVIDER_SITE_OTHER): Payer: Medicare Other | Admitting: Physician Assistant

## 2020-05-09 VITALS — BP 99/61 | HR 74 | Temp 98.2°F | Ht 70.0 in | Wt 228.0 lb

## 2020-05-09 DIAGNOSIS — E785 Hyperlipidemia, unspecified: Secondary | ICD-10-CM

## 2020-05-09 DIAGNOSIS — E66811 Obesity, class 1: Secondary | ICD-10-CM

## 2020-05-09 DIAGNOSIS — E1169 Type 2 diabetes mellitus with other specified complication: Secondary | ICD-10-CM

## 2020-05-09 DIAGNOSIS — Z6832 Body mass index (BMI) 32.0-32.9, adult: Secondary | ICD-10-CM | POA: Diagnosis not present

## 2020-05-09 DIAGNOSIS — E669 Obesity, unspecified: Secondary | ICD-10-CM

## 2020-05-09 DIAGNOSIS — E559 Vitamin D deficiency, unspecified: Secondary | ICD-10-CM | POA: Diagnosis not present

## 2020-05-09 DIAGNOSIS — F3289 Other specified depressive episodes: Secondary | ICD-10-CM | POA: Diagnosis not present

## 2020-05-09 MED ORDER — OZEMPIC (0.25 OR 0.5 MG/DOSE) 2 MG/1.5ML ~~LOC~~ SOPN: 1.0000 mg | PEN_INJECTOR | SUBCUTANEOUS | 0 refills | Status: DC

## 2020-05-09 MED ORDER — VITAMIN D (ERGOCALCIFEROL) 1.25 MG (50000 UNIT) PO CAPS: 50000.0000 [IU] | ORAL_CAPSULE | ORAL | 0 refills | Status: DC

## 2020-05-09 MED ORDER — BUPROPION HCL ER (SR) 150 MG PO TB12: 150.0000 mg | ORAL_TABLET | Freq: Two times a day (BID) | ORAL | 0 refills | Status: DC

## 2020-05-09 NOTE — Progress Notes (Signed)
Chief Complaint:   OBESITY [name] [name] is here to discuss his progress with his obesity treatment plan along with follow-up of his obesity related diagnoses. [name] [name] is on the Category 4 Plan and states he is following his eating plan approximately 80% of the time. [name] [name] states he is doing PT 45-60 minutes 2 times per week.  Today's visit was #: 1 Starting weight: 272 lbs Starting date: 02/14/2018 Today's weight: 228 lbs Today's date: 05/09/2020 Total lbs lost to date: 44 Total lbs lost since last in-office visit: 3  Interim History: [name] [name] reports that his snacking is still out of control on some days. He has been out of Ozempic, but when he takes it, it helps control his appetite.  Subjective:   Type 2 diabetes mellitus with hyperlipidemia ([place]). [name] [name] is on Ozempic and metformin. No nausea, vomiting, or diarrhea. No hypoglycemia. He is not checking his blood sugars at home.   Lab Results  Component Value Date   HGBA1C 5.8 (H) 12/12/2019   HGBA1C 6.1 (H) 08/22/2019   HGBA1C 6.4 05/19/2019   Lab Results  Component Value Date   MICROALBUR <0.7 07/04/2015   LDLCALC 68 12/12/2019   CREATININE 0.95 12/12/2019   No results found for: INSULIN  Other depression, with emotional eating. [name] [name] is struggling with emotional eating and using food for comfort to the extent that it is negatively impacting his health. He has been working on behavior modification techniques to help reduce his emotional eating and has been somewhat successful. He shows no sign of suicidal or homicidal ideations. [name] [name] is on bupropion and reports no cravings. Blood pressure is controlled.  Vitamin D deficiency. [name] [name] is on prescription Vitamin D. No nausea, vomiting, or muscle weakness. Last Vitamin D was 35.0 on 12/12/2019.  Assessment/Plan:   Type 2 diabetes mellitus with hyperlipidemia ([place]).  Good blood sugar control is important to decrease the likelihood of diabetic complications such as  nephropathy, neuropathy, limb loss, blindness, coronary artery disease, and death. Intensive lifestyle modification including diet, exercise and weight loss are the first line of treatment for diabetes. [name] [name] will increase his Semaglutide,0.25 or 0.5MG /DOS, (OZEMPIC, 0.25 OR 0.5 MG/DOSE,) 2 MG/1.5ML SOPN to 1 mg, #5 pens with 0 refills. Comprehensive metabolic panel, Hemoglobin A1c, Insulin, random, Lipid Panel With LDL/HDL Ratio labs were ordered today.  Other depression, with emotional eating. Behavior modification techniques were discussed today to help [name] [name] deal with his emotional/non-hunger eating behaviors.  Orders and follow up as documented in patient record. Refill was given for buPROPion (WELLBUTRIN SR) 150 MG 12 hr tablet #60 with 0 refills.  Vitamin D deficiency. Low Vitamin D level contributes to fatigue and are associated with obesity, breast, and colon cancer. He was given a refill on his Vitamin D, Ergocalciferol, (DRISDOL) 1.25 MG (50000 UNIT) CAPS capsule every week #4 with 0 refills and VITAMIN D 25 Hydroxy (Vit-D Deficiency, Fractures) level was ordered today.  Class 1 obesity with serious comorbidity and body mass index (BMI) of 32.0 to 32.9 in adult, unspecified obesity type.  [name] [name] is currently in the action stage of change. As such, his goal is to continue with weight loss efforts. He has agreed to the Category 4 Plan.   Exercise goals: [name] [name] should follow the adult guidelines. When [name] [name] cannot meet the adult guidelines, they should be as physically active as their abilities and conditions will allow.   Behavioral modification strategies: meal planning and cooking strategies and keeping healthy foods in the  home.  [name] [name] has agreed to follow-up with our clinic in 4 weeks. He was informed of the importance of frequent follow-up visits to maximize his success with intensive lifestyle modifications for his multiple health conditions.   [name] [name] was informed we  would discuss his lab results at his next visit unless there is a critical issue that needs to be addressed sooner. [name] [name] agreed to keep his next visit at the agreed upon time to discuss these results.  Objective:   Blood pressure 99/61, pulse 74, temperature 98.2 F (36.8 C), temperature source Oral, height 5\' 10"  (1.778 m), weight 228 lb (103.4 kg), SpO2 95 %. Body mass index is 32.71 kg/m.  General: Cooperative, alert, well developed, in no acute distress. HEENT: Conjunctivae and lids unremarkable. Cardiovascular: Regular rhythm.  Lungs: Normal work of breathing. Neurologic: No focal deficits.   Lab Results  Component Value Date   CREATININE 0.95 12/12/2019   BUN 22 12/12/2019   NA 138 12/12/2019   K 4.0 12/12/2019   CL 101 12/12/2019   CO2 24 12/12/2019   Lab Results  Component Value Date   ALT 18 12/12/2019   AST 14 12/12/2019   ALKPHOS 34 (L) 12/12/2019   BILITOT 0.4 12/12/2019   Lab Results  Component Value Date   HGBA1C 5.8 (H) 12/12/2019   HGBA1C 6.1 (H) 08/22/2019   HGBA1C 6.4 05/19/2019   HGBA1C 6.1 (H) 02/14/2019   HGBA1C 5.8 (A) 11/14/2018   No results found for: INSULIN Lab Results  Component Value Date   TSH 1.25 02/05/2020   Lab Results  Component Value Date   CHOL 130 12/12/2019   HDL 48 12/12/2019   LDLCALC 68 12/12/2019   LDLDIRECT 112.0 07/31/2016   TRIG 69 12/12/2019   CHOLHDL 3 08/10/2018   Lab Results  Component Value Date   WBC 7.2 02/14/2018   HGB 13.4 02/14/2018   HCT 39.3 02/14/2018   MCV 85 02/14/2018   PLT 211.0 05/21/2014   No results found for: IRON, TIBC, FERRITIN  Obesity Behavioral Intervention Documentation for Insurance:   Approximately 15 minutes were spent on the discussion below.  ASK: We discussed the diagnosis of obesity with [name] [name] today and [name] [name] agreed to give [name] permission to discuss obesity behavioral modification therapy today.  ASSESS: [name] [name] has the diagnosis of obesity and his BMI today is 32.8.  [name] [name] is in the action stage of change.   ADVISE: [name] [name] was educated on the multiple health risks of obesity as well as the benefit of weight loss to improve his health. He was advised of the need for long term treatment and the importance of lifestyle modifications to improve his current health and to decrease his risk of future health problems.  AGREE: Multiple dietary modification options and treatment options were discussed and [name] [name] agreed to follow the recommendations documented in the above note.  ARRANGE: [name] [name] was educated on the importance of frequent visits to treat obesity as outlined per CMS and USPSTF guidelines and agreed to schedule his next follow up appointment today.  Attestation Statements:   Reviewed by clinician on day of visit: allergies, medications, problem list, medical history, surgical history, family history, social history, and previous encounter notes.  [name] [name], am acting as transcriptionist for [name] Potash, PA-C   I have reviewed the above documentation for accuracy and completeness, and I agree with the above. [name] Potash, PA-C

## 2020-05-10 LAB — COMPREHENSIVE METABOLIC PANEL
ALT: 13 IU/L (ref 0–44)
AST: 9 IU/L (ref 0–40)
Albumin/Globulin Ratio: 1.8 (ref 1.2–2.2)
Albumin: 4.1 g/dL (ref 3.8–4.8)
Alkaline Phosphatase: 39 IU/L — ABNORMAL LOW (ref 48–121)
BUN/Creatinine Ratio: 22 (ref 10–24)
BUN: 23 mg/dL (ref 8–27)
Bilirubin Total: 0.3 mg/dL (ref 0.0–1.2)
CO2: 26 mmol/L (ref 20–29)
Calcium: 9.4 mg/dL (ref 8.6–10.2)
Chloride: 105 mmol/L (ref 96–106)
Creatinine, Ser: 1.06 mg/dL (ref 0.76–1.27)
GFR calc Af Amer: 84 mL/min/{1.73_m2} (ref >59)
GFR calc non Af Amer: 72 mL/min/{1.73_m2} (ref >59)
Globulin, Total: 2.3 g/dL (ref 1.5–4.5)
Glucose: 85 mg/dL (ref 65–99)
Potassium: 4.1 mmol/L (ref 3.5–5.2)
Sodium: 141 mmol/L (ref 134–144)
Total Protein: 6.4 g/dL (ref 6.0–8.5)

## 2020-05-10 LAB — HEMOGLOBIN A1C
Est. average glucose Bld gHb Est-mCnc: 117 mg/dL
Hgb A1c MFr Bld: 5.7 % — ABNORMAL HIGH (ref 4.8–5.6)

## 2020-05-10 LAB — LIPID PANEL WITH LDL/HDL RATIO
Cholesterol, Total: 112 mg/dL (ref 100–199)
HDL: 42 mg/dL (ref >39)
LDL Chol Calc (NIH): 56 mg/dL (ref 0–99)
LDL/HDL Ratio: 1.3 ratio (ref 0.0–3.6)
Triglycerides: 66 mg/dL (ref 0–149)
VLDL Cholesterol Cal: 14 mg/dL (ref 5–40)

## 2020-05-10 LAB — INSULIN, RANDOM: INSULIN: 5.3 u[IU]/mL (ref 2.6–24.9)

## 2020-05-10 LAB — VITAMIN D 25 HYDROXY (VIT D DEFICIENCY, FRACTURES): Vit D, 25-Hydroxy: 52.3 ng/mL (ref 30.0–100.0)

## 2020-05-13 DIAGNOSIS — M6281 Muscle weakness (generalized): Secondary | ICD-10-CM | POA: Diagnosis not present

## 2020-05-13 DIAGNOSIS — M545 Low back pain: Secondary | ICD-10-CM | POA: Diagnosis not present

## 2020-05-13 DIAGNOSIS — M5416 Radiculopathy, lumbar region: Secondary | ICD-10-CM | POA: Diagnosis not present

## 2020-05-15 DIAGNOSIS — M5416 Radiculopathy, lumbar region: Secondary | ICD-10-CM | POA: Diagnosis not present

## 2020-05-15 DIAGNOSIS — M545 Low back pain: Secondary | ICD-10-CM | POA: Diagnosis not present

## 2020-05-15 DIAGNOSIS — M6281 Muscle weakness (generalized): Secondary | ICD-10-CM | POA: Diagnosis not present

## 2020-05-16 ENCOUNTER — Other Ambulatory Visit: Payer: Self-pay

## 2020-05-16 ENCOUNTER — Ambulatory Visit (INDEPENDENT_AMBULATORY_CARE_PROVIDER_SITE_OTHER): Payer: Medicare Other | Admitting: Family Medicine

## 2020-05-16 ENCOUNTER — Encounter: Payer: Self-pay | Admitting: Family Medicine

## 2020-05-16 VITALS — BP 106/65 | HR 75 | Temp 97.9°F | Resp 16 | Ht 70.0 in | Wt 239.6 lb

## 2020-05-16 DIAGNOSIS — Z87448 Personal history of other diseases of urinary system: Secondary | ICD-10-CM | POA: Diagnosis not present

## 2020-05-16 DIAGNOSIS — N138 Other obstructive and reflux uropathy: Secondary | ICD-10-CM | POA: Diagnosis not present

## 2020-05-16 DIAGNOSIS — N401 Enlarged prostate with lower urinary tract symptoms: Secondary | ICD-10-CM

## 2020-05-16 DIAGNOSIS — E118 Type 2 diabetes mellitus with unspecified complications: Secondary | ICD-10-CM | POA: Diagnosis not present

## 2020-05-16 DIAGNOSIS — M48062 Spinal stenosis, lumbar region with neurogenic claudication: Secondary | ICD-10-CM | POA: Diagnosis not present

## 2020-05-16 DIAGNOSIS — E7800 Pure hypercholesterolemia, unspecified: Secondary | ICD-10-CM | POA: Diagnosis not present

## 2020-05-16 DIAGNOSIS — Z125 Encounter for screening for malignant neoplasm of prostate: Secondary | ICD-10-CM

## 2020-05-16 DIAGNOSIS — M47816 Spondylosis without myelopathy or radiculopathy, lumbar region: Secondary | ICD-10-CM

## 2020-05-16 LAB — MICROALBUMIN / CREATININE URINE RATIO
Creatinine,U: 114.8 mg/dL
Microalb Creat Ratio: 0.6 mg/g (ref 0.0–30.0)
Microalb, Ur: <0.7 mg/dL (ref 0.0–1.9)

## 2020-05-16 LAB — PSA, MEDICARE: PSA: 0.08 ng/ml — ABNORMAL LOW (ref 0.10–4.00)

## 2020-05-16 LAB — PSA SCREENING: PSA: 0.08 ng/mL

## 2020-05-16 MED ORDER — CELECOXIB 200 MG PO CAPS
200.0000 mg | ORAL_CAPSULE | Freq: Two times a day (BID) | ORAL | 6 refills | Status: DC
Start: 2020-05-16 — End: 2020-11-25

## 2020-05-16 NOTE — Progress Notes (Signed)
OFFICE VISIT  05/16/2020   CC:  Chief Complaint  Patient presents with  . Follow-up    RCI, pt is not fasting   HPI:    Patient is a 69 y.o. Caucasian male who presents for 7 mo f/u DM, HLD, obesity class I. He is followed closely by nonsurgical b[facility]ric clinic [facility] Most recent labs were 1 wk ago and all excellent.  His lumbar spinal stenosis pain is worsening (most recent MRI confirmed this as well), L LB region down L leg primarily.  He will be trying to arrange ESI with Dr. Vertell Limber soon.  Last injection gave some relief for about 1 mo.  He is feeling like PT no longer useful.  Aleve no help.  Gabapentin no help for pain but helps RLS. Not sleeping well due to his pain.  Takes melatonin for sleep.  Surgery may be in his future, conversations still to be had with Dr. Vertell Limber per pt.  Urine flow poor: down to a dribbling rate, sits a long time to empty.  Nocturia q2h. No frank/complete urinary retention.  He sits to urinate b/c when stands to urinate he just sprays urine everywhere.  No blood in urine.  No persistent dysuria--mild.  Sometimes strains to get the urine out.  No urinary urgency. Frequency during the day not a big deal.  Has never seen a urologist.   He drinks 1-2 glasses of water before bed in attempt to make more urine/improve flow. In fact, says he's not hydrating in daytime like he should.   ROS: no fevers, no CP, no SOB, no wheezing, no cough, no dizziness, no HAs, no rashes, no melena/hematochezia.  No polyuria or polydipsia.   No focal weakness, paresthesias, or tremors.  No acute vision or hearing abnormalities. No n/v/d or abd pain.  No palpitations.    Past Medical History:  Diagnosis Date  . Allergic rhinitis, unspecified    Allergy testing via allergist 04/2017--mostly neg.  Dr. Donneta Romberg recommended referral to ENT so i did this.  . Back pain    LBP->DDD/spondylosis w/ intermittent radiulopathy-type leg pains, + bilat hip and thigh  pain-->Dr. Vertell Limber to do MRI as of 05/2019  . BPH (benign prostatic hypertrophy)   . Chronic nasal congestion   . DDD (degenerative disc disease), lumbar 2010   laminectomy 01/2009.  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  . Depression    Hospitalized for suicidal ideation 1992.  Has been on lexapro since 10/2008.  . Diabetes mellitus type II    Dx'd approximately 06/2008.  No retinopathy as of 02/2017 ophth.  . Erectile dysfunction   . Heart murmur, systolic 32/9924   mild intensity, asymptomatic; echo 01/2018 valves fine->obs  . History of colon polyps 01/2016   Recall 3 yrs  . History of pneumonia 2008   Hospitalized  . History of scarlet fever    age 35  . Hyperlipidemia, mixed 1993   myalgias on pravastatin  . Hypertension 2009   "marked chronic cardiomegaly" on CXR 01/2009 per old records.  . 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 Insomnia   . Keratoconus    dx'd 1973  . Memory changes   . Mild cognitive impairment with memory loss 01/2020 eval with Dr. Delice Lesch   MRI brain and labs planned  . OSA on CPAP 2004; 2019   Back on CPAP 04/2018.  Compliance great as of 01/2020 neuro/sleep f/u  . Osteoarthritis of both  knees    Mild plain film changes in medial compartment bilat  . Restless leg    gabapentin helpful  . Rhinitis, chronic   . 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 APPENDECTOMY  1972  . COLONOSCOPY  X 4   Most recent was 2007 [place], [place]), with removal of polyps in the first two.  02/21/16: tubular adenoma.  Recall 3 yrs (Dr. Ardis Hughs).  . 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 ELECTROCARDIOGRAM  01/29/2009   NORMAL  . LUMBAR LAMINECTOMY  01/2009  . TONSILLECTOMY AND ADENOIDECTOMY     age 53  . TRANSTHORACIC ECHOCARDIOGRAM  02/22/2018   EF 55-60%, grd I DD  . URETHRAL DILATION     2 as a child, one as an  adult  . VASECTOMY  1994  . VITRECTOMY      Outpatient Medications Prior to Visit  Medication Sig Dispense Refill  . aspirin 325 MG tablet Take 325 mg by mouth daily.      [name] atorvastatin (LIPITOR) 80 MG tablet Take 1 tablet (80 mg total) by mouth daily. 90 tablet 0  . buPROPion (WELLBUTRIN SR) 150 MG 12 hr tablet Take 1 tablet (150 mg total) by mouth 2 (two) times daily. 60 tablet 0  . Choline Fenofibrate (FENOFIBRIC ACID) 135 MG CPDR Take 1 capsule by mouth daily. 90 capsule 0  . escitalopram (LEXAPRO) 20 MG tablet Take 1 tablet (20 mg total) by mouth daily. 90 tablet 0  . finasteride (PROSCAR) 5 MG tablet Take 1 tablet (5 mg total) by mouth daily. 90 tablet 0  . fluticasone (FLONASE) 50 MCG/ACT nasal spray Place 2 sprays into both nostrils daily. 48 g 1  . gabapentin (NEURONTIN) 300 MG capsule Take 300 mg by mouth 3 (three) times daily.    [name] glucose blood (ONE TOUCH ULTRA TEST) test strip 1 each by Other route 3 (three) times daily. Test sugars up to three times daily. ( insurance preference  DX E11.65 E11.9) 100 each 11  . glucose blood test strip Per Insurance Preference- Test sugars up to three times daily DX E11.9 100 each 11  . insulin detemir (LEVEMIR FLEXTOUCH) 100 UNIT/ML FlexPen 56 U SQ once every day 15 mL 3  . Insulin Pen Needle (BD PEN NEEDLE NANO U/F) 32G X 4 MM MISC USE FOR INSULIN INJECTION FOUR TIMES A DAY 100 each 0  . MELATONIN PO Take by mouth.    . metFORMIN (GLUCOPHAGE) 1000 MG tablet TAKE 1 TABLET BY MOUTH TWICE DAILY WITH A MEAL 180 tablet 0  . Naproxen Sodium (ALEVE) 220 MG CAPS Take 2 capsules by mouth at bedtime as needed.      . Omega-3 Fatty Acids (FISH OIL) 1000 MG CAPS Take 1 capsule by mouth 2 (two) times daily.    [name] SALINE NASAL SPRAY NA Place into the nose daily as needed.    . Semaglutide,0.25 or 0.5MG /DOS, (OZEMPIC, 0.25 OR 0.5 MG/DOSE,) 2 MG/1.5ML SOPN Inject 0.75 mLs (1 mg total) into the skin once a week. 5 pen 0  . tamsulosin (FLOMAX) 0.4 MG CAPS  capsule Take 2 capsules (0.8 mg total) by mouth at bedtime. 180 capsule 0  . vitamin B-12 (CYANOCOBALAMIN) 1000 MCG tablet Take 2,000 mcg by mouth daily.     . Vitamin D, Ergocalciferol, (DRISDOL) 1.25 MG (50000 UNIT) CAPS capsule Take  1 capsule (50,000 Units total) by mouth every 7 (seven) days. 4 capsule 0   No facility-administered medications prior to visit.    Allergies  Allergen Reactions  . Simvastatin Other (See Comments)    myalgias  . Antihistamines, Diphenhydramine-Type Other (See Comments)    depression  . Codeine Nausea Only  . Penicillins Hives  . Sulfa Antibiotics Hives    ROS As per HPI  PE: Vitals with BMI 05/16/2020 05/09/2020 04/15/2020  Height 5\' 10"  5\' 10"  5\' 10"   Weight 239 lbs 10 oz 228 lbs 231 lbs  BMI 34.38 43.32 95.18  Systolic 841 99 95  Diastolic 65 61 58  Pulse 75 74 114  O2 sat on RA today is 96%  Gen: Alert, well appearing.  Patient is oriented to person, place, time, and situation. AFFECT: pleasant, lucid thought and speech. Rectal exam: negative without mass, lesions or tenderness, PROSTATE EXAM: smooth and symmetric without nodules or tenderness.   LABS:  Lab Results  Component Value Date   TSH 1.25 02/05/2020   Lab Results  Component Value Date   WBC 7.2 02/14/2018   HGB 13.4 02/14/2018   HCT 39.3 02/14/2018   MCV 85 02/14/2018   PLT 211.0 05/21/2014   Lab Results  Component Value Date   CREATININE 1.06 05/09/2020   BUN 23 05/09/2020   NA 141 05/09/2020   K 4.1 05/09/2020   CL 105 05/09/2020   CO2 26 05/09/2020   Lab Results  Component Value Date   ALT 13 05/09/2020   AST 9 05/09/2020   ALKPHOS 39 (L) 05/09/2020   BILITOT 0.3 05/09/2020   Lab Results  Component Value Date   CHOL 112 05/09/2020   Lab Results  Component Value Date   HDL 42 05/09/2020   Lab Results  Component Value Date   LDLCALC 56 05/09/2020   Lab Results  Component Value Date   TRIG 66 05/09/2020   Lab Results  Component Value Date    CHOLHDL 3 08/10/2018   Lab Results  Component Value Date   PSA 0.08 (L) 05/16/2020   PSA 0.04 (L) 05/19/2019   PSA 0.04 (L) 05/09/2018   Lab Results  Component Value Date   HGBA1C 5.7 (H) 05/09/2020   IMPRESSION AND PLAN:  1) BPH with obstruction/lower urinary tract sx's: not responsive to flomax and finasteride.  No sign of OAB component.  He'd like to delay any urol referral/consideration of TURP, etc until he gets his back situation figured out better.  We'll see if a renal u/s shows any sign of hydronephrosis and if not then we'll just hold off on urol referral for now.  Renal function has been normal. Checking urine microalb/cr today as part of DM routine monitoring. Continue flomax 0.8 mg qhs and finasteride 5mg  qd. 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  Consider recurrence of this as cause of his current sx's.  May benefit from cystourethrography, retrograde urethrogram, etc--advance urologic diagnostic testing.  Will call pt back and present this option to him.  2) Prostate ca screening: DRE unremarkable today. PSA ordered.  3) DM 2: good control.  HbA1c 5.7% 1 wk ago! Urine microalb/cr today. A1c's being followed by his non-surg bariatric clinic. Continue levemir, metformin, and ozempic.  4) HLD: tolerating atorvastatin and fenofibrate. Recent lipids excellent and hepatic panel normal.  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  Ongoing mgmt with Dr.  Judieth Keens do trial of celebrex 200mg  bid for him. No opioids b/c these may cause worsening of his bladder emptying problems.  6) Hx of adenomatous colon polyps: due for repeat colonoscopy as of 2020---he'll put this on the back burner for now.  An After Visit Summary was printed and given to the patient.  FOLLOW UP: Return in about 6 months (around 11/15/2020) for routine chronic illness f/u.  Signed:  Crissie Sickles, MD           05/16/2020

## 2020-05-21 ENCOUNTER — Encounter: Payer: Self-pay | Admitting: Family Medicine

## 2020-05-22 DIAGNOSIS — M6281 Muscle weakness (generalized): Secondary | ICD-10-CM | POA: Diagnosis not present

## 2020-05-22 DIAGNOSIS — M5416 Radiculopathy, lumbar region: Secondary | ICD-10-CM | POA: Diagnosis not present

## 2020-05-22 DIAGNOSIS — M545 Low back pain: Secondary | ICD-10-CM | POA: Diagnosis not present

## 2020-05-27 ENCOUNTER — Other Ambulatory Visit (INDEPENDENT_AMBULATORY_CARE_PROVIDER_SITE_OTHER): Payer: Self-pay | Admitting: Physician Assistant

## 2020-05-27 DIAGNOSIS — E1169 Type 2 diabetes mellitus with other specified complication: Secondary | ICD-10-CM

## 2020-05-29 DIAGNOSIS — M5416 Radiculopathy, lumbar region: Secondary | ICD-10-CM | POA: Diagnosis not present

## 2020-05-29 DIAGNOSIS — M6281 Muscle weakness (generalized): Secondary | ICD-10-CM | POA: Diagnosis not present

## 2020-05-29 DIAGNOSIS — M545 Low back pain: Secondary | ICD-10-CM | POA: Diagnosis not present

## 2020-06-01 ENCOUNTER — Other Ambulatory Visit (INDEPENDENT_AMBULATORY_CARE_PROVIDER_SITE_OTHER): Payer: Self-pay | Admitting: Physician Assistant

## 2020-06-01 DIAGNOSIS — E1169 Type 2 diabetes mellitus with other specified complication: Secondary | ICD-10-CM

## 2020-06-01 DIAGNOSIS — F3289 Other specified depressive episodes: Secondary | ICD-10-CM

## 2020-06-01 DIAGNOSIS — E785 Hyperlipidemia, unspecified: Secondary | ICD-10-CM

## 2020-06-04 ENCOUNTER — Other Ambulatory Visit (INDEPENDENT_AMBULATORY_CARE_PROVIDER_SITE_OTHER): Payer: Self-pay | Admitting: Physician Assistant

## 2020-06-04 DIAGNOSIS — E559 Vitamin D deficiency, unspecified: Secondary | ICD-10-CM

## 2020-06-04 DIAGNOSIS — F3289 Other specified depressive episodes: Secondary | ICD-10-CM

## 2020-06-05 ENCOUNTER — Ambulatory Visit (HOSPITAL_COMMUNITY)
Admission: RE | Admit: 2020-06-05 | Discharge: 2020-06-05 | Disposition: A | Discharge status: Home / Self Care | Payer: Medicare Other | Source: Ambulatory Visit | Attending: Family Medicine | Admitting: Family Medicine

## 2020-06-05 ENCOUNTER — Other Ambulatory Visit: Payer: Self-pay

## 2020-06-05 DIAGNOSIS — N138 Other obstructive and reflux uropathy: Secondary | ICD-10-CM | POA: Insufficient documentation

## 2020-06-05 DIAGNOSIS — N281 Cyst of kidney, acquired: Secondary | ICD-10-CM | POA: Diagnosis not present

## 2020-06-05 DIAGNOSIS — N401 Enlarged prostate with lower urinary tract symptoms: Secondary | ICD-10-CM | POA: Diagnosis not present

## 2020-06-05 DIAGNOSIS — N3289 Other specified disorders of bladder: Secondary | ICD-10-CM | POA: Diagnosis not present

## 2020-06-05 DIAGNOSIS — Q600 Renal agenesis, unilateral: Secondary | ICD-10-CM | POA: Diagnosis not present

## 2020-06-05 IMAGING — US US RENAL
1 series · 14 of 25 positions shown · non-contrast
Comparison: None.

CLINICAL DATA: 69-year-old male with BPH and obstructive symptoms

EXAM:
RENAL / URINARY TRACT ULTRASOUND COMPLETE

[Series 1: us renal · 14 of 51 slices shown]
[im 1/51]
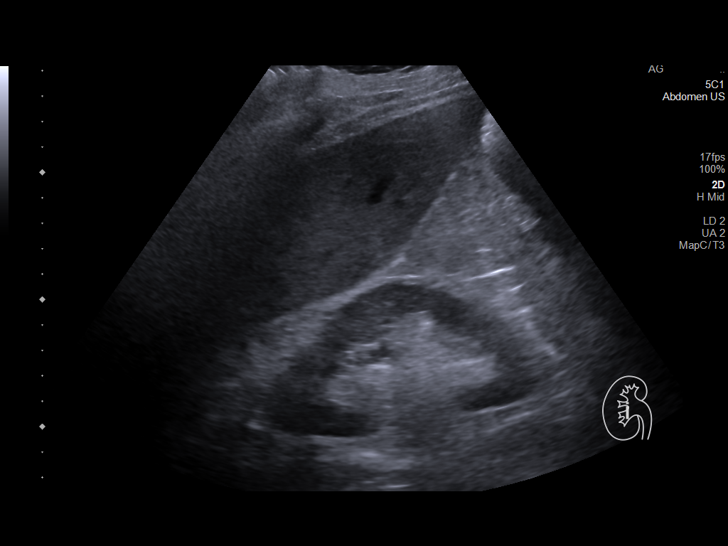
[im 5/51]
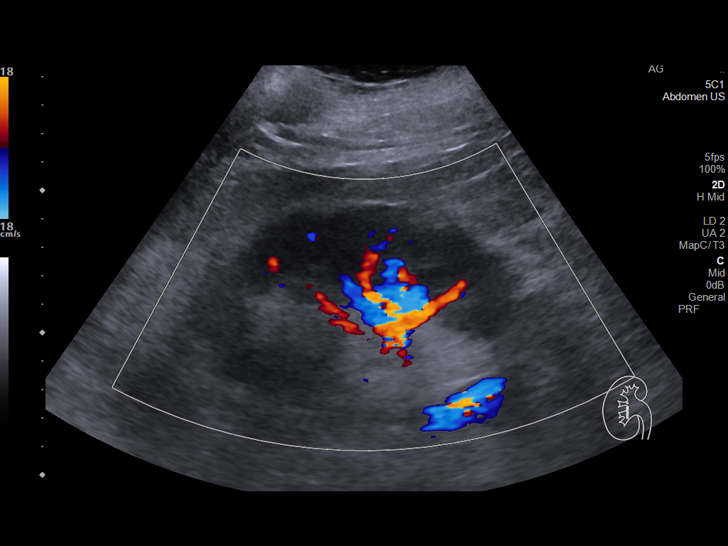
[im 9/51]
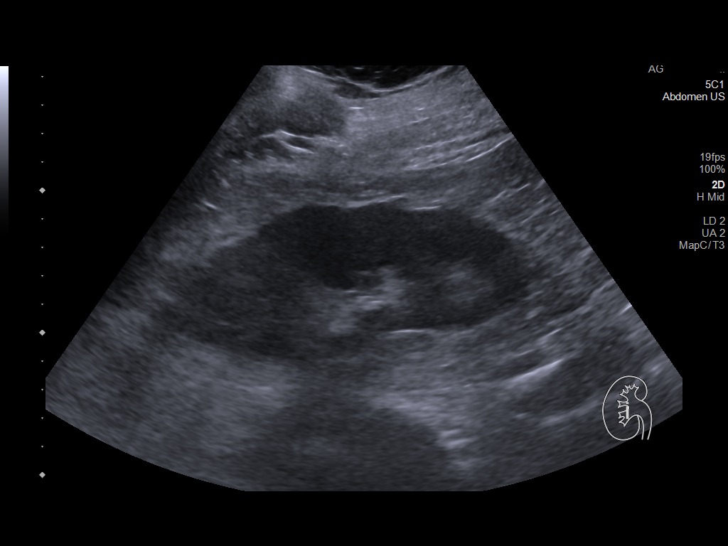
[im 13/51]
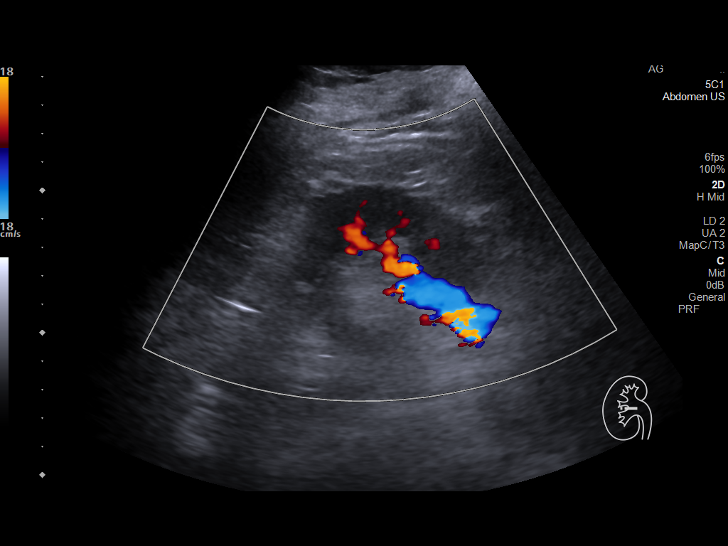
[im 17/51]
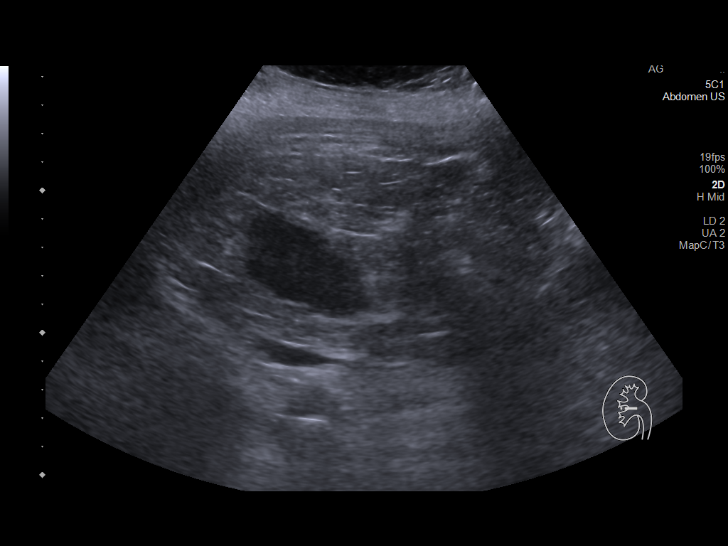
[im 19/51]
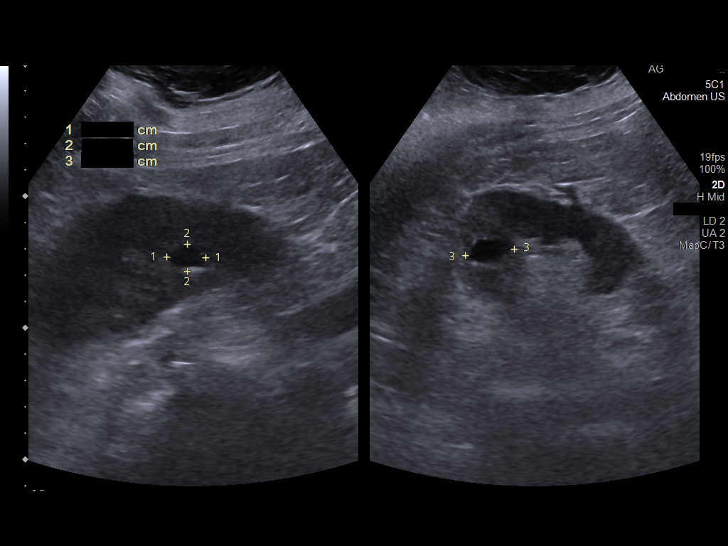
[im 23/51]
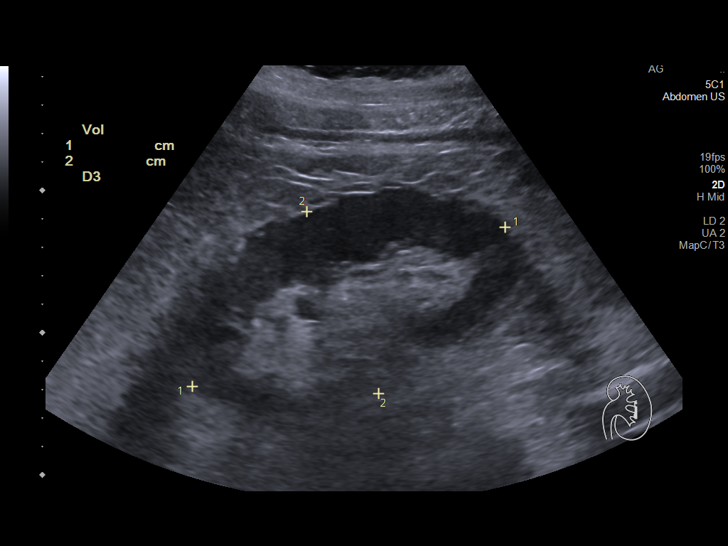
[im 28/51]
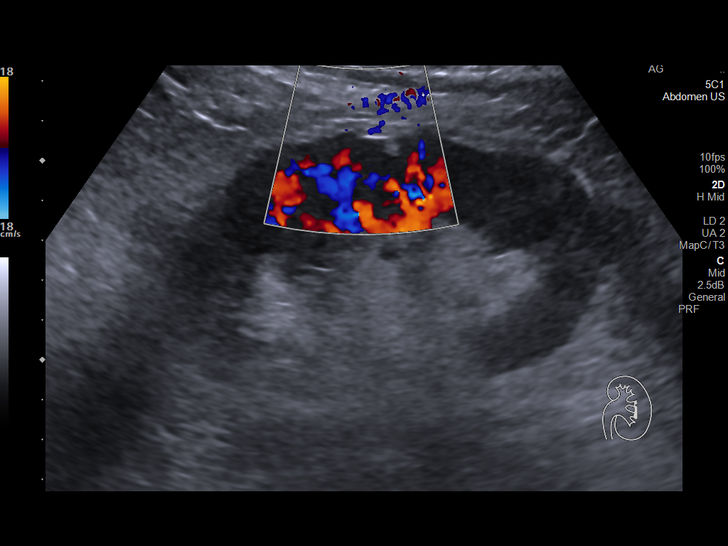
[im 32/51]
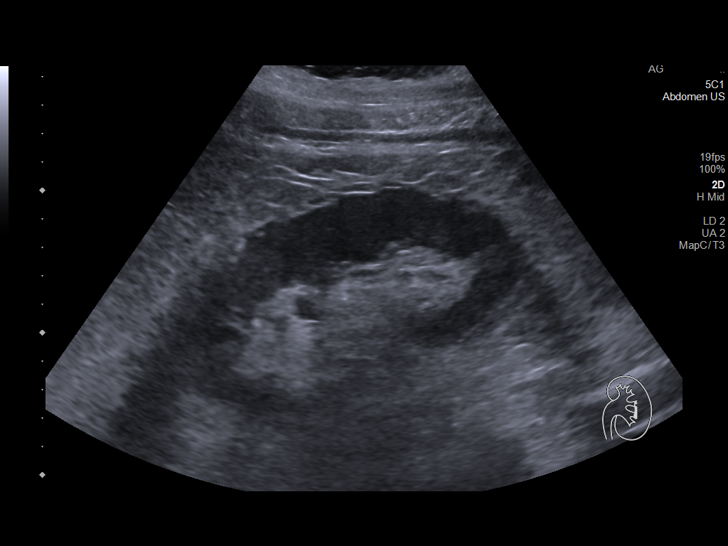
[im 34/51]
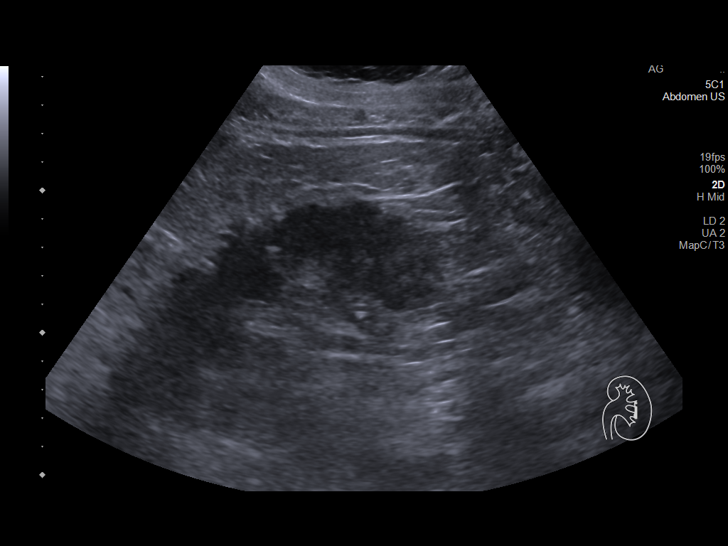
[im 38/51]
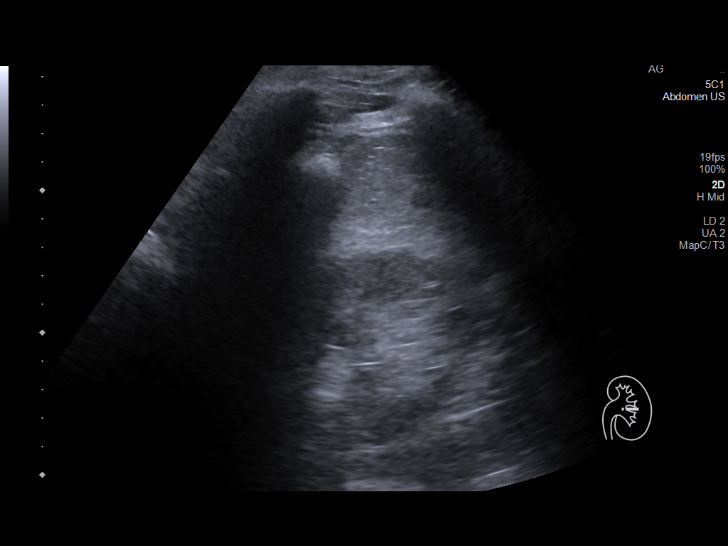
[im 42/51]
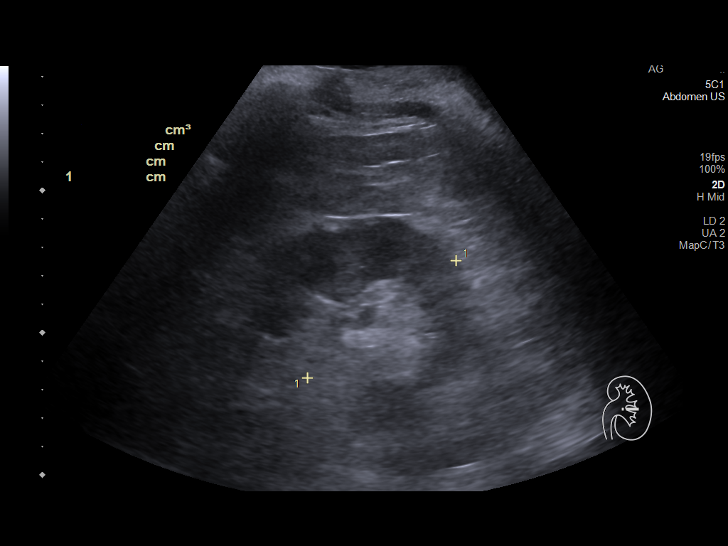
[im 46/51]
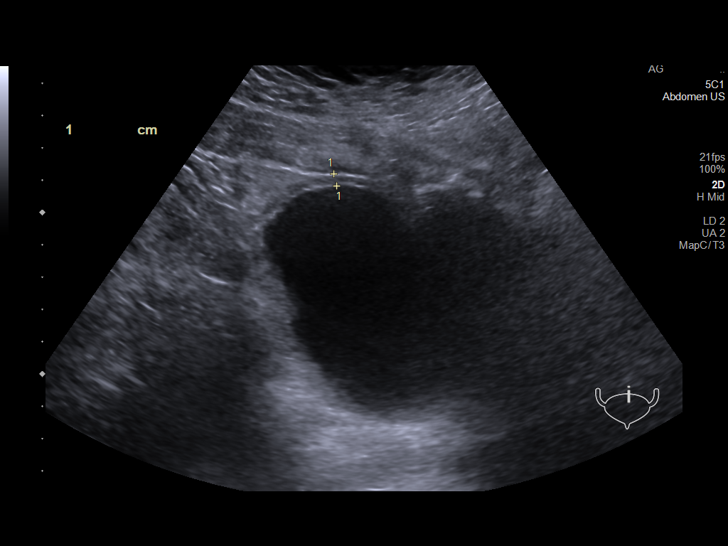
[im 51/51]
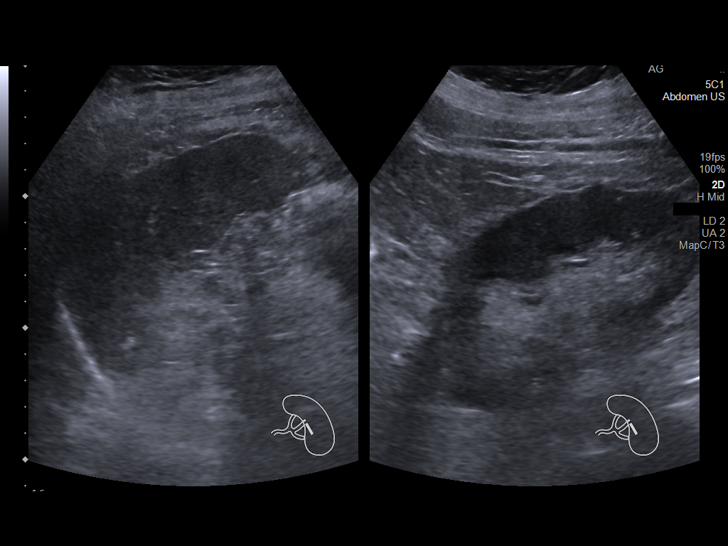

[14 of 25 positions shown; findings below may reference images not displayed]

FINDINGS: Right Kidney:

Length: 12.3 cm x 6.3 cm x 6.2 cm, 251 cc. No hydronephrosis.
Echogenicity unremarkable. Flow in the hilum of the right kidney
confirmed. Anechoic cystic structure with through transmission
measures 1.5 cm x 1.0 cm x 1.9 cm.

Left Kidney:

Length: 12.3 cm x 6.9 cm x 6.7 cm, 296 cc. Echogenicity within
normal limits. No hydronephrosis. Flow confirmed in the left kidney.
There is a small hyperechoic focus on the lateral cortex of the left
kidney measuring 5 mm. No internal flow.

Bladder:

Urinary bladder incompletely distended.  Ureteral jets visualized.
IMPRESSION: No evidence of hydronephrosis.

Bosniak 1 cyst of the right kidney.

Hyperechoic focus on the lateral cortex of the left kidney measures
5 mm, potentially a small AML or pararenal fat within a lobulation.

## 2020-06-06 ENCOUNTER — Encounter (INDEPENDENT_AMBULATORY_CARE_PROVIDER_SITE_OTHER): Payer: Self-pay | Admitting: Physician Assistant

## 2020-06-06 ENCOUNTER — Telehealth (INDEPENDENT_AMBULATORY_CARE_PROVIDER_SITE_OTHER): Payer: Self-pay | Admitting: Physician Assistant

## 2020-06-06 ENCOUNTER — Ambulatory Visit (INDEPENDENT_AMBULATORY_CARE_PROVIDER_SITE_OTHER): Payer: Medicare Other | Admitting: Physician Assistant

## 2020-06-06 VITALS — BP 110/69 | HR 77 | Temp 98.0°F | Ht 70.0 in | Wt 229.0 lb

## 2020-06-06 DIAGNOSIS — E669 Obesity, unspecified: Secondary | ICD-10-CM

## 2020-06-06 DIAGNOSIS — E1169 Type 2 diabetes mellitus with other specified complication: Secondary | ICD-10-CM

## 2020-06-06 DIAGNOSIS — F3289 Other specified depressive episodes: Secondary | ICD-10-CM

## 2020-06-06 DIAGNOSIS — Z6832 Body mass index (BMI) 32.0-32.9, adult: Secondary | ICD-10-CM | POA: Diagnosis not present

## 2020-06-06 DIAGNOSIS — E785 Hyperlipidemia, unspecified: Secondary | ICD-10-CM

## 2020-06-06 MED ORDER — OZEMPIC (0.25 OR 0.5 MG/DOSE) 2 MG/1.5ML ~~LOC~~ SOPN: 1.0000 mg | PEN_INJECTOR | SUBCUTANEOUS | 0 refills | Status: DC

## 2020-06-06 NOTE — Progress Notes (Signed)
Chief Complaint:   OBESITY [name] [name] is here to discuss his progress with his obesity treatment plan along with follow-up of his obesity related diagnoses. [name] [name] is on the Category 4 Plan and states he is following his eating plan approximately 70% of the time. [name] [name] states he is doing PT 45 minutes 2 times per week.  Today's visit was #: 27 Starting weight: 272 lbs Starting date: 02/14/2018 Today's weight: 229 lbs Today's date: 06/06/2020 Total lbs lost to date: 43 Total lbs lost since last in-office visit: 0  Interim History: [name] [name] states that he has been "off [employer]" with his eating. He has been eating more due to stress and pain from sciatica.  Subjective:   Type 2 diabetes mellitus with hyperlipidemia ([name] [name]). [name] [name] is on insulin, Ozempic, and metformin.  Lab Results  Component Value Date   HGBA1C 5.7 (H) 05/09/2020   HGBA1C 5.8 (H) 12/12/2019   HGBA1C 6.1 (H) 08/22/2019   Lab Results  Component Value Date   MICROALBUR <0.7 05/16/2020   LDLCALC 56 05/09/2020   CREATININE 1.06 05/09/2020   Lab Results  Component Value Date   INSULIN 5.3 05/09/2020   Other depression, with emotional eating. [name] [name] is struggling with emotional eating and using food for comfort to the extent that it is negatively impacting his health. He has been working on behavior modification techniques to help reduce his emotional eating and has been somewhat successful. He shows no sign of suicidal or homicidal ideations. [name] [name] is on Lexapro and Wellbutrin.  Assessment/Plan:   Type 2 diabetes mellitus with hyperlipidemia ([name]). Good blood sugar control is important to decrease the likelihood of diabetic complications such as nephropathy, neuropathy, limb loss, blindness, coronary artery disease, and death. Intensive lifestyle modification including diet, exercise and weight loss are the first line of treatment for diabetes. Refill was given for (OZEMPIC, 0.25 OR 0.5 MG/DOSE,) 2  MG/1.5ML SOPN #5 pens with 0 refills. [name] [name] will continue his medications as directed.  Other depression, with emotional eating. Behavior modification techniques were discussed today to help [name] [name] deal with his emotional/non-hunger eating behaviors.  Orders and follow up as documented in patient record. He will continue his medications as directed.  Class 1 obesity with serious comorbidity and body mass index (BMI) of 32.0 to 32.9 in adult, unspecified obesity type.  [name] [name] is currently in the action stage of change. As such, his goal is to continue with weight loss efforts. He has agreed to the Category 4 Plan.   Exercise goals: Older adults should follow the adult guidelines. When older adults cannot meet the adult guidelines, they should be as physically active as their abilities and conditions will allow.   Behavioral modification strategies: meal planning and cooking strategies and keeping healthy foods in the home.  [name] [name] has agreed to follow-up with our clinic in 4 weeks. He was informed of the importance of frequent follow-up visits to maximize his success with intensive lifestyle modifications for his multiple health conditions.   Objective:   Blood pressure 110/69, pulse 77, temperature 98 F (36.7 C), temperature source Oral, height 5\' 10"  (1.778 m), weight 229 lb (103.9 kg), SpO2 96 %. Body mass index is 32.86 kg/m.  General: Cooperative, alert, well developed, in no acute distress. HEENT: Conjunctivae and lids unremarkable. Cardiovascular: Regular rhythm.  Lungs: Normal work of breathing. Neurologic: No focal deficits.   Lab Results  Component Value Date   CREATININE 1.06 05/09/2020   BUN 23 05/09/2020   NA  141 05/09/2020   K 4.1 05/09/2020   CL 105 05/09/2020   CO2 26 05/09/2020   Lab Results  Component Value Date   ALT 13 05/09/2020   AST 9 05/09/2020   ALKPHOS 39 (L) 05/09/2020   BILITOT 0.3 05/09/2020   Lab Results  Component Value Date   HGBA1C 5.7  (H) 05/09/2020   HGBA1C 5.8 (H) 12/12/2019   HGBA1C 6.1 (H) 08/22/2019   HGBA1C 6.4 05/19/2019   HGBA1C 6.1 (H) 02/14/2019   Lab Results  Component Value Date   INSULIN 5.3 05/09/2020   Lab Results  Component Value Date   TSH 1.25 02/05/2020   Lab Results  Component Value Date   CHOL 112 05/09/2020   HDL 42 05/09/2020   LDLCALC 56 05/09/2020   LDLDIRECT 112.0 07/31/2016   TRIG 66 05/09/2020   CHOLHDL 3 08/10/2018   Lab Results  Component Value Date   WBC 7.2 02/14/2018   HGB 13.4 02/14/2018   HCT 39.3 02/14/2018   MCV 85 02/14/2018   PLT 211.0 05/21/2014   No results found for: IRON, TIBC, FERRITIN  Obesity Behavioral Intervention Documentation for Insurance:   Approximately 15 minutes were spent on the discussion below.  ASK: We discussed the diagnosis of obesity with [name] [name] today and [name] [name] agreed to give [name] permission to discuss obesity behavioral modification therapy today.  ASSESS: [name] has the diagnosis of obesity and his BMI today is 32.9. [name] is in the action stage of change.   ADVISE: [name] was educated on the multiple health risks of obesity as well as the benefit of weight loss to improve his health. He was advised of the need for long term treatment and the importance of lifestyle modifications to improve his current health and to decrease his risk of future health problems.  AGREE: Multiple dietary modification options and treatment options were discussed and [name] agreed to follow the recommendations documented in the above note.  ARRANGE: [name] was educated on the importance of frequent visits to treat obesity as outlined per CMS and USPSTF guidelines and agreed to schedule his next follow up appointment today.  Attestation Statements:   Reviewed by clinician on day of visit: allergies, medications, problem list, medical history, surgical history, family history, social history, and previous encounter notes.  IMichaelene Song, am acting as  transcriptionist for Abby Potash, PA-C   I have reviewed the above documentation for accuracy and completeness, and I agree with the above. Abby Potash, PA-C

## 2020-06-06 NOTE — Telephone Encounter (Signed)
Tom returned a call from a CMA but didn't have their name.

## 2020-06-06 NOTE — Telephone Encounter (Signed)
PT advised that his insurance will not cover the increase in Ozempic dose. Advised to continue on current dose. PT verbalized understanding and requested a RF. Refill to be sent to pharmacy on file. Waylan Rocher LPN

## 2020-06-12 DIAGNOSIS — M5416 Radiculopathy, lumbar region: Secondary | ICD-10-CM | POA: Diagnosis not present

## 2020-06-12 DIAGNOSIS — M6281 Muscle weakness (generalized): Secondary | ICD-10-CM | POA: Diagnosis not present

## 2020-06-12 DIAGNOSIS — M545 Low back pain: Secondary | ICD-10-CM | POA: Diagnosis not present

## 2020-06-15 ENCOUNTER — Other Ambulatory Visit: Payer: Self-pay | Admitting: Family Medicine

## 2020-06-18 ENCOUNTER — Encounter (INDEPENDENT_AMBULATORY_CARE_PROVIDER_SITE_OTHER): Payer: Self-pay | Admitting: Physician Assistant

## 2020-06-18 ENCOUNTER — Other Ambulatory Visit (INDEPENDENT_AMBULATORY_CARE_PROVIDER_SITE_OTHER): Payer: Self-pay

## 2020-06-18 DIAGNOSIS — F3289 Other specified depressive episodes: Secondary | ICD-10-CM

## 2020-06-18 DIAGNOSIS — E559 Vitamin D deficiency, unspecified: Secondary | ICD-10-CM

## 2020-06-18 MED ORDER — VITAMIN D (ERGOCALCIFEROL) 1.25 MG (50000 UNIT) PO CAPS: 50000.0000 [IU] | ORAL_CAPSULE | ORAL | 0 refills | Status: DC

## 2020-06-18 MED ORDER — BUPROPION HCL ER (SR) 150 MG PO TB12: 150.0000 mg | ORAL_TABLET | Freq: Two times a day (BID) | ORAL | 0 refills | Status: DC

## 2020-06-18 MED ORDER — ESCITALOPRAM OXALATE 20 MG PO TABS: 20.0000 mg | ORAL_TABLET | Freq: Every day | ORAL | 0 refills | Status: DC

## 2020-06-19 DIAGNOSIS — M5416 Radiculopathy, lumbar region: Secondary | ICD-10-CM | POA: Diagnosis not present

## 2020-06-19 DIAGNOSIS — M6281 Muscle weakness (generalized): Secondary | ICD-10-CM | POA: Diagnosis not present

## 2020-06-19 DIAGNOSIS — M545 Low back pain: Secondary | ICD-10-CM | POA: Diagnosis not present

## 2020-06-25 ENCOUNTER — Other Ambulatory Visit (INDEPENDENT_AMBULATORY_CARE_PROVIDER_SITE_OTHER): Payer: Self-pay | Admitting: Physician Assistant

## 2020-06-25 DIAGNOSIS — Z794 Long term (current) use of insulin: Secondary | ICD-10-CM

## 2020-06-25 DIAGNOSIS — E119 Type 2 diabetes mellitus without complications: Secondary | ICD-10-CM

## 2020-06-27 NOTE — Telephone Encounter (Signed)
Please advise 

## 2020-07-03 ENCOUNTER — Ambulatory Visit (INDEPENDENT_AMBULATORY_CARE_PROVIDER_SITE_OTHER): Payer: Medicare Other | Admitting: Physician Assistant

## 2020-07-11 ENCOUNTER — Other Ambulatory Visit (INDEPENDENT_AMBULATORY_CARE_PROVIDER_SITE_OTHER): Payer: Self-pay | Admitting: Physician Assistant

## 2020-07-11 DIAGNOSIS — E1169 Type 2 diabetes mellitus with other specified complication: Secondary | ICD-10-CM

## 2020-07-14 ENCOUNTER — Other Ambulatory Visit (INDEPENDENT_AMBULATORY_CARE_PROVIDER_SITE_OTHER): Payer: Self-pay | Admitting: Physician Assistant

## 2020-07-14 ENCOUNTER — Other Ambulatory Visit: Payer: Self-pay | Admitting: Family Medicine

## 2020-07-14 DIAGNOSIS — F3289 Other specified depressive episodes: Secondary | ICD-10-CM

## 2020-07-15 ENCOUNTER — Other Ambulatory Visit (INDEPENDENT_AMBULATORY_CARE_PROVIDER_SITE_OTHER): Payer: Self-pay | Admitting: Physician Assistant

## 2020-07-15 DIAGNOSIS — E1169 Type 2 diabetes mellitus with other specified complication: Secondary | ICD-10-CM

## 2020-07-16 ENCOUNTER — Encounter (INDEPENDENT_AMBULATORY_CARE_PROVIDER_SITE_OTHER): Payer: Self-pay | Admitting: Physician Assistant

## 2020-07-16 ENCOUNTER — Ambulatory Visit (INDEPENDENT_AMBULATORY_CARE_PROVIDER_SITE_OTHER): Payer: Medicare Other | Admitting: Physician Assistant

## 2020-07-16 ENCOUNTER — Other Ambulatory Visit: Payer: Self-pay

## 2020-07-16 VITALS — BP 130/76 | HR 79 | Temp 98.2°F | Ht 70.0 in | Wt 237.0 lb

## 2020-07-16 DIAGNOSIS — F3289 Other specified depressive episodes: Secondary | ICD-10-CM

## 2020-07-16 DIAGNOSIS — E1169 Type 2 diabetes mellitus with other specified complication: Secondary | ICD-10-CM

## 2020-07-16 DIAGNOSIS — Z6834 Body mass index (BMI) 34.0-34.9, adult: Secondary | ICD-10-CM

## 2020-07-16 DIAGNOSIS — E785 Hyperlipidemia, unspecified: Secondary | ICD-10-CM | POA: Diagnosis not present

## 2020-07-16 DIAGNOSIS — E669 Obesity, unspecified: Secondary | ICD-10-CM

## 2020-07-16 DIAGNOSIS — E559 Vitamin D deficiency, unspecified: Secondary | ICD-10-CM | POA: Diagnosis not present

## 2020-07-16 MED ORDER — OZEMPIC (1 MG/DOSE) 2 MG/1.5ML ~~LOC~~ SOPN: 1.0000 mg | PEN_INJECTOR | SUBCUTANEOUS | 0 refills | Status: DC

## 2020-07-16 MED ORDER — ESCITALOPRAM OXALATE 20 MG PO TABS: 20.0000 mg | ORAL_TABLET | Freq: Every day | ORAL | 0 refills | Status: DC

## 2020-07-16 MED ORDER — VITAMIN D (ERGOCALCIFEROL) 1.25 MG (50000 UNIT) PO CAPS: 50000.0000 [IU] | ORAL_CAPSULE | ORAL | 0 refills | Status: DC

## 2020-07-16 MED ORDER — BUPROPION HCL ER (SR) 150 MG PO TB12: 150.0000 mg | ORAL_TABLET | Freq: Two times a day (BID) | ORAL | 0 refills | Status: DC

## 2020-07-17 NOTE — Progress Notes (Signed)
Chief Complaint:   OBESITY [name] [name] is here to discuss his progress with his obesity treatment plan along with follow-up of his obesity related diagnoses. [name] is on the Category 4 Plan and states he is following his eating plan approximately 60% of the time. [name] states he is exercising 0 minutes 0 times per week.  Today's visit was #: 71 Starting weight: 272 lbs Starting date: 02/14/2018 Today's weight: 237 lbs Today's date: 07/16/2020 Total lbs lost to date: 35 Total lbs lost since last in-office visit: 0  Interim History: [name] [name] reports that his back pain has worsened and he is more sedentary, which makes him want to eat more. He is overeating his snack calories.  Subjective:   Type 2 diabetes mellitus with hyperlipidemia ([place]). Last A1c was 5.7 on 05/09/2020.  Lab Results  Component Value Date   HGBA1C 5.7 (H) 05/09/2020   HGBA1C 5.8 (H) 12/12/2019   HGBA1C 6.1 (H) 08/22/2019   Lab Results  Component Value Date   MICROALBUR <0.7 05/16/2020   LDLCALC 56 05/09/2020   CREATININE 1.06 05/09/2020   Lab Results  Component Value Date   INSULIN 5.3 05/09/2020   Vitamin D deficiency. [place] is on prescription Vitamin D supplementation. No nausea, vomiting, or muscle weakness.    Ref. Range 05/09/2020 16:06  Vitamin D, 25-Hydroxy Latest Ref Range: 30.0 - 100.0 ng/mL 52.3   Other depression, with emotional eating. [name] [name] is struggling with emotional eating and using food for comfort to the extent that it is negatively impacting his health. He has been working on behavior modification techniques to help reduce his emotional eating and has been somewhat successful. He shows no sign of suicidal or homicidal ideations.  Assessment/Plan:   Type 2 diabetes mellitus with hyperlipidemia ([name] [name]). Good blood sugar control is important to decrease the likelihood of diabetic complications such as nephropathy, neuropathy, limb loss, blindness, coronary artery disease, and  death. Intensive lifestyle modification including diet, exercise and weight loss are the first line of treatment for diabetes. Harvey will increase Semaglutide, 1 MG/DOSE, (OZEMPIC, 1 MG/DOSE,) 2 MG/1.5ML SOPN to 1 mg #5 pens with 0 refills.  Vitamin D deficiency. Low Vitamin D level contributes to fatigue and are associated with obesity, breast, and colon cancer. He was given a refill on his Vitamin D, Ergocalciferol, (DRISDOL) 1.25 MG (50000 UNIT) CAPS capsule every week #4 with 0 refills and will follow-up for routine testing of Vitamin D, at least 2-3 times per year to avoid over-replacement.   Other depression, with emotional eating.  Behavior modification techniques were discussed today to help [name] deal with his emotional/non-hunger eating behaviors.  Orders and follow up as documented in patient record. Refills were given for escitalopram (LEXAPRO) 20 MG tablet #90 with 0 refills and buPROPion (WELLBUTRIN SR) 150 MG 12 hr tablet #60 with 0 refills.  Class 1 obesity with serious comorbidity and body mass index (BMI) of 34.0 to 34.9 in adult, unspecified obesity type.    [name] [name] is currently in the action stage of change. As such, his goal is to continue with weight loss efforts. He has agreed to the Category 4 Plan.   Exercise goals: Older adults should follow the adult guidelines. When older adults cannot meet the adult guidelines, they should be as physically active as their abilities and conditions will allow.   Behavioral modification strategies: ways to avoid boredom eating and better snacking choices.  [name] [name] has agreed to follow-up with our clinic in 3 weeks. He  was informed of the importance of frequent follow-up visits to maximize his success with intensive lifestyle modifications for his multiple health conditions.   Objective:   Blood pressure 130/76, pulse 79, temperature 98.2 F (36.8 C), temperature source Oral, height 5\' 10"  (1.778 m), weight 237 lb (107.5 kg), SpO2 95  %. Body mass index is 34.01 kg/m.  General: Cooperative, alert, well developed, in no acute distress. HEENT: Conjunctivae and lids unremarkable. Cardiovascular: Regular rhythm.  Lungs: Normal work of breathing. Neurologic: No focal deficits.   Lab Results  Component Value Date   CREATININE 1.06 05/09/2020   BUN 23 05/09/2020   NA 141 05/09/2020   K 4.1 05/09/2020   CL 105 05/09/2020   CO2 26 05/09/2020   Lab Results  Component Value Date   ALT 13 05/09/2020   AST 9 05/09/2020   ALKPHOS 39 (L) 05/09/2020   BILITOT 0.3 05/09/2020   Lab Results  Component Value Date   HGBA1C 5.7 (H) 05/09/2020   HGBA1C 5.8 (H) 12/12/2019   HGBA1C 6.1 (H) 08/22/2019   HGBA1C 6.4 05/19/2019   HGBA1C 6.1 (H) 02/14/2019   Lab Results  Component Value Date   INSULIN 5.3 05/09/2020   Lab Results  Component Value Date   TSH 1.25 02/05/2020   Lab Results  Component Value Date   CHOL 112 05/09/2020   HDL 42 05/09/2020   LDLCALC 56 05/09/2020   LDLDIRECT 112.0 07/31/2016   TRIG 66 05/09/2020   CHOLHDL 3 08/10/2018   Lab Results  Component Value Date   WBC 7.2 02/14/2018   HGB 13.4 02/14/2018   HCT 39.3 02/14/2018   MCV 85 02/14/2018   PLT 211.0 05/21/2014   No results found for: IRON, TIBC, FERRITIN  Obesity Behavioral Intervention Documentation for Insurance:   Approximately 15 minutes were spent on the discussion below.  ASK: We discussed the diagnosis of obesity with [name] [name] today and [name] [name] agreed to give [name] [name] permission to discuss obesity behavioral modification therapy today.  ASSESS: Pao has the diagnosis of obesity and his BMI today is 34.1. [name] [name] is in the action stage of change.   ADVISE: [name] [name] was educated on the multiple health risks of obesity as well as the benefit of weight loss to improve his health. He was advised of the need for long term treatment and the importance of lifestyle modifications to improve his current health and to decrease his risk of  future health problems.  AGREE: Multiple dietary modification options and treatment options were discussed and [name] [name] agreed to follow the recommendations documented in the above note.  ARRANGE: [name] [name] was educated on the importance of frequent visits to treat obesity as outlined per CMS and USPSTF guidelines and agreed to schedule his next follow up appointment today.  Attestation Statements:   Reviewed by clinician on day of visit: allergies, medications, problem list, medical history, surgical history, family history, social history, and previous encounter notes.  IMichaelene Song, am acting as transcriptionist for Abby Potash, PA-C   I have reviewed the above documentation for accuracy and completeness, and I agree with the above. Abby Potash, PA-C

## 2020-07-25 DIAGNOSIS — M5416 Radiculopathy, lumbar region: Secondary | ICD-10-CM | POA: Diagnosis not present

## 2020-08-08 ENCOUNTER — Ambulatory Visit (INDEPENDENT_AMBULATORY_CARE_PROVIDER_SITE_OTHER): Payer: Medicare Other | Admitting: Physician Assistant

## 2020-08-08 ENCOUNTER — Other Ambulatory Visit: Payer: Self-pay

## 2020-08-08 ENCOUNTER — Encounter (INDEPENDENT_AMBULATORY_CARE_PROVIDER_SITE_OTHER): Payer: Self-pay | Admitting: Physician Assistant

## 2020-08-08 VITALS — BP 121/64 | HR 81 | Temp 98.3°F | Ht 70.0 in | Wt 229.0 lb

## 2020-08-08 DIAGNOSIS — Z6832 Body mass index (BMI) 32.0-32.9, adult: Secondary | ICD-10-CM

## 2020-08-08 DIAGNOSIS — F3289 Other specified depressive episodes: Secondary | ICD-10-CM

## 2020-08-08 DIAGNOSIS — E559 Vitamin D deficiency, unspecified: Secondary | ICD-10-CM

## 2020-08-08 DIAGNOSIS — E669 Obesity, unspecified: Secondary | ICD-10-CM | POA: Diagnosis not present

## 2020-08-08 MED ORDER — BUPROPION HCL ER (SR) 150 MG PO TB12: 150.0000 mg | ORAL_TABLET | Freq: Two times a day (BID) | ORAL | 0 refills | Status: DC

## 2020-08-08 MED ORDER — VITAMIN D (ERGOCALCIFEROL) 1.25 MG (50000 UNIT) PO CAPS: 50000.0000 [IU] | ORAL_CAPSULE | ORAL | 0 refills | Status: DC

## 2020-08-11 NOTE — Progress Notes (Signed)
Chief Complaint:   OBESITY [name] [name] is here to discuss his progress with his obesity treatment plan along with follow-up of his obesity related diagnoses. [name] [name] is on the Category 4 Plan and states he is following his eating plan approximately 80% of the time. [name] [name] states he is exercising for 0 minutes 0 times per week.  Today's visit was #: 16 Starting weight: 272 lbs Starting date: 02/14/2018 Today's weight: 229 lbs Today's date: 08/08/2020 Total lbs lost to date: 43 lbs Total lbs lost since last in-office visit: 8 lbs  Interim History: [name] [name] feels like the [name] [name] is helping decrease his cravings.  He continues to be hungry between meals.  He is going to see the neurosurgeon on October 4 regarding his back pain.  Subjective:   1. Vitamin D deficiency [name] [name]'s Vitamin D level was 52.3 on 05/09/2020. He is currently taking prescription vitamin D 50,000 IU each week. He denies nausea, vomiting or muscle weakness.  Tolerating well.  2. Other depression, with emotional eating  No SI/HI.  On bupropion.  He has seen a decrease in cravings.  Assessment/Plan:   1. Vitamin D deficiency Low Vitamin D level contributes to fatigue and are associated with obesity, breast, and colon cancer. He agrees to continue to take prescription Vitamin D @50 ,000 IU every week and will follow-up for routine testing of Vitamin D, at least 2-3 times per year to avoid over-replacement.  -Refill Vitamin D, Ergocalciferol, (DRISDOL) 1.25 MG (50000 UNIT) CAPS capsule; Take 1 capsule (50,000 Units total) by mouth every 7 (seven) days.  Dispense: 4 capsule; Refill: 0  2. Other depression, with emotional eating  Continue bupropion.  Refill provided today.  -Refill buPROPion (WELLBUTRIN SR) 150 MG 12 hr tablet; Take 1 tablet (150 mg total) by mouth 2 (two) times daily.  Dispense: 60 tablet; Refill: 0  3. Class 1 obesity with serious comorbidity and body mass index (BMI) of 32.0 to 32.9 in adult, unspecified  obesity type [name] [name] is currently in the action stage of change. As such, his goal is to continue with weight loss efforts. He has agreed to the Category 4 Plan.  Add 2 ounces of protein at lunch and may use condiment calories for protein.  Exercise goals: Older adults should follow the adult guidelines. When older adults cannot meet the adult guidelines, they should be as physically active as their abilities and conditions will allow.  Older adults should do exercises that maintain or improve balance if they are at risk of falling.   Behavioral modification strategies: increasing lean protein intake.  Fasting labs at next visit.  [name] [name] has agreed to follow-up with our clinic in 3 weeks. He was informed of the importance of frequent follow-up visits to maximize his success with intensive lifestyle modifications for his multiple health conditions.   Objective:   Blood pressure 121/64, pulse 81, temperature 98.3 F (36.8 C), temperature source Oral, height 5\' 10"  (1.778 m), weight 229 lb (103.9 kg), SpO2 94 %. Body mass index is 32.86 kg/m.  General: Cooperative, alert, well developed, in no acute distress. HEENT: Conjunctivae and lids unremarkable. Cardiovascular: Regular rhythm.  Lungs: Normal work of breathing. Neurologic: No focal deficits.   Lab Results  Component Value Date   CREATININE 1.06 05/09/2020   BUN 23 05/09/2020   NA 141 05/09/2020   K 4.1 05/09/2020   CL 105 05/09/2020   CO2 26 05/09/2020   Lab Results  Component Value Date   ALT 13 05/09/2020  AST 9 05/09/2020   ALKPHOS 39 (L) 05/09/2020   BILITOT 0.3 05/09/2020   Lab Results  Component Value Date   HGBA1C 5.7 (H) 05/09/2020   HGBA1C 5.8 (H) 12/12/2019   HGBA1C 6.1 (H) 08/22/2019   HGBA1C 6.4 05/19/2019   HGBA1C 6.1 (H) 02/14/2019   Lab Results  Component Value Date   INSULIN 5.3 05/09/2020   Lab Results  Component Value Date   TSH 1.25 02/05/2020   Lab Results  Component Value Date   CHOL  112 05/09/2020   HDL 42 05/09/2020   LDLCALC 56 05/09/2020   LDLDIRECT 112.0 07/31/2016   TRIG 66 05/09/2020   CHOLHDL 3 08/10/2018   Lab Results  Component Value Date   WBC 7.2 02/14/2018   HGB 13.4 02/14/2018   HCT 39.3 02/14/2018   MCV 85 02/14/2018   PLT 211.0 05/21/2014   Obesity Behavioral Intervention:   Approximately 15 minutes were spent on the discussion below.  ASK: We discussed the diagnosis of obesity with [name] [name] today and [name] [name] agreed to give [name] permission to discuss obesity behavioral modification therapy today.  ASSESS: [name] [name] has the diagnosis of obesity and his BMI today is 32.9. [name] [name] is in the action stage of change.   ADVISE: [name] [name] was educated on the multiple health risks of obesity as well as the benefit of weight loss to improve his health. He was advised of the need for long term treatment and the importance of lifestyle modifications to improve his current health and to decrease his risk of future health problems.  AGREE: Multiple dietary modification options and treatment options were discussed and [name] [name] agreed to follow the recommendations documented in the above note.  ARRANGE: [name] [name] was educated on the importance of frequent visits to treat obesity as outlined per CMS and USPSTF guidelines and agreed to schedule his next follow up appointment today.  Attestation Statements:   Reviewed by clinician on day of visit: allergies, medications, problem list, medical history, surgical history, family history, social history, and previous encounter notes.  I, Water quality scientist, CMA, am acting as transcriptionist for Abby Potash, PA-C  I have reviewed the above documentation for accuracy and completeness, and I agree with the above. Abby Potash, PA-C

## 2020-08-13 ENCOUNTER — Encounter: Payer: Self-pay | Admitting: Neurology

## 2020-08-13 ENCOUNTER — Other Ambulatory Visit: Payer: Self-pay

## 2020-08-13 ENCOUNTER — Ambulatory Visit (INDEPENDENT_AMBULATORY_CARE_PROVIDER_SITE_OTHER): Payer: Medicare Other | Admitting: Neurology

## 2020-08-13 VITALS — BP 138/72 | HR 94 | Ht 70.0 in | Wt 236.4 lb

## 2020-08-13 DIAGNOSIS — R413 Other amnesia: Secondary | ICD-10-CM

## 2020-08-13 NOTE — Patient Instructions (Signed)
Good to see you. Follow-up as needed, call for any changes.  There are some activities which have therapeutic value and can be useful in keeping you cognitively stimulated. You can try this website: https://www.[facility].org/get-to-know-barrow/centers-programs/neurorehabilitation-center/neuro-rehab-apps-and-games/ which has options, categorized by level of difficulty.   RECOMMENDATIONS FOR ALL PATIENTS WITH MEMORY PROBLEMS: 1. Continue to exercise (Recommend 30 minutes of walking everyday, or 3 hours every week) 2. Increase social interactions - continue going to [place] and enjoy social gatherings with friends and family 3. Eat healthy, avoid fried foods and eat more fruits and vegetables 4. Maintain adequate blood pressure, blood sugar, and blood cholesterol level. Reducing the risk of stroke and cardiovascular disease also helps promoting better memory. 5. Avoid stressful situations. Live a simple life and avoid aggravations. Organize your time and prepare for the next day in anticipation. 6. Sleep well, avoid any interruptions of sleep and avoid any distractions in the bedroom that may interfere with adequate sleep quality 7. Avoid sugar, avoid sweets as there is a strong link between excessive sugar intake, diabetes, and cognitive impairment We discussed the Mediterranean diet, which has been shown to help patients reduce the risk of progressive memory disorders and reduces cardiovascular risk. This includes eating fish, eat fruits and green leafy vegetables, nuts like almonds and hazelnuts, walnuts, and also use olive oil. Avoid fast foods and fried foods as much as possible. Avoid sweets and sugar as sugar use has been linked to worsening of memory function.

## 2020-08-13 NOTE — Progress Notes (Signed)
NEUROLOGY FOLLOW UP OFFICE NOTE  [name] THOMANN [id number] [birth year]  HISTORY OF PRESENT ILLNESS: I had the pleasure of seeing [name] [name] in follow-up in the neurology clinic on 08/13/2020.  The patient was last seen 6 months ago for memory changes. He is alone in the office today.  Records and images were personally reviewed where available.  I personally reviewed MRI brain without contrast done 02/2020 which did not show any acute changes. There was mild diffuse atrophy. No evidence of chronic microvascular disease. He underwent Neuropsychological testing in March 2021 with normal results. It was noted that it is possible he is sensitive to normal age-related cognitive decline or has some very mild cognitive diminishment not showing on testing related to very high premorbid ability, although an impression of clear or significant impairment is not warranted given his test data. TSH and B12 normal.  He has not noticed much change in his memory since last visit, except for extremely short term memory. He goes back and forth when he needs a number of things from the kitchen, because he forgets something. He denies getting lost driving or missing medications. His wife manages finances. He denies any headaches. He has occasional dizziness, but mostly balance issues due to his back problem. Walking is an issue currently, he did physical therapy but it has plateaud. Nerve blocks help only briefly. Sleep is generally okay. Mood is okay. They are currently on a diet through the weight loss clinic.     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 He and his wife of 25 years started noticing changes a couple of years ago. He always used to remember names great but now has problems remembering names of neighbors, friends, actors.  He is having difficulty remembering and following directions while driving, it is not unusual that he does not remember where to go. This is his wife's greatest concern as well. She is now very used to giving directions even to familiar places. He is having increasing issues with simple directions, she tells him to go left and he wants to turn right. He would not recall instructions she just said a few minutes prior. One time a few months ago he did not know where they were, he could not place himself and was totally confused where he was, which was not far from home. She notes that even calculating the time needed to leave for today's appointment, he could not recall where our building was and was confused with very simple time calculations. He does not remember where things are placed, having a hard time telling his wife where to find things in the kitchen. He loves to read books on history/politics, but for the past several months, reading certain things are "not clicking" with him, he finishes a paragraph and has to think of what it meant. His wife reports he usually has "phenomonenal comprehension," so this is a big change for him. He forgets his medications around once a week, he swears he took them but sees the pill in the box the next day. His wife has always managed bills. They have also noticed increasing irritability with inanimate objects, he gets very angry and upset at something he drops. No paranoia or hallucinations. He is on Lexapro for sleep, and Wellbutrin started to control cravings. He used to have depression but has been really  good the past 10 years overall. He was evaluated at by neurologist Dr. Rexene [name] in 2019 and was advised to start CPAP first and see if this helped with cognitive issues. He has not noticed any difference, and feels memory has gotten worse. He sleeps much better with his CPAP, most of the time rested in the morning. He still has some daytime drowsiness, but only needs naps  1-2 times a month. No REM behavior symptoms.  He denies any headaches, diplopia, dysarthria, neck pain, bowel dysfunction. He has dizziness when making turns, no falls. He has low back pain and pain behind his thighs, currently doing PT. Nerve block helped. He has occasional numbness in his toes and fingers. He has noticed frequent urination, no incontinence. He has noticed slight reduction in sense of smell. He has occasional right arm tremors, he noticed it while doing PT this morning. He recalls having more of the tremor years ago when under stress with his ex-wife. His mother had dementia. No history of significant head injuries. He is a social drinker (3-4 drinks a month).   Lab Results  Component Value Date   TSH 1.25 02/05/2020    PAST MEDICAL HISTORY: Past Medical History:  Diagnosis Date  . Allergic rhinitis, unspecified    Allergy testing via allergist 04/2017--mostly neg.  Dr. Donneta Romberg recommended referral to ENT so i did this.  . Back pain    LBP->DDD/spondylosis w/ intermittent radiulopathy-type leg pains, + bilat hip and thigh pain-->Dr. Vertell Limber to do MRI as of 05/2019  . BPH (benign prostatic hypertrophy)    no hydro on renal u/s 05/2020  . Chronic nasal congestion   . DDD (degenerative disc disease), lumbar 2010   laminectomy 01/2009.  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  . Depression    Hospitalized for suicidal ideation 1992.  Has been on lexapro since 10/2008.  . Diabetes mellitus type II    Dx'd approximately 06/2008.  No retinopathy as of 02/2017 ophth.  . Erectile dysfunction   . Heart murmur, systolic 42/5956   mild intensity, asymptomatic; echo 01/2018 valves fine->obs  . History of colon polyps 01/2016   Recall 3 yrs  . History of pneumonia 2008   Hospitalized  . History of scarlet fever    age 69  . Hyperlipidemia, mixed 1993   myalgias on pravastatin  . Hypertension 2009   "marked chronic cardiomegaly" on CXR 01/2009 per  old records.  . 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 Insomnia   . Keratoconus    dx'd 1973  . Memory changes   . Mild cognitive impairment with memory loss 01/2020 eval with Dr. Delice Lesch   MRI brain 03/01/20 NORMAL  . OSA on CPAP 2004; 2019   Back on CPAP 04/2018.  Compliance great as of 01/2020 neuro/sleep f/u  . Osteoarthritis of both knees    Mild plain film changes in medial compartment bilat  . Restless leg    gabapentin helpful  . Rhinitis, chronic   . 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 aspirin 325 MG tablet Take 325 mg by mouth daily.      [name] atorvastatin (LIPITOR) 80 MG tablet TAKE 1 TABLET(80 MG) BY MOUTH DAILY 90 tablet 0  . BD PEN NEEDLE NANO 2ND GEN 32G X 4  MM MISC USE TO INJECT 4 TIMES DAILY 100 each 0  . buPROPion (WELLBUTRIN SR) 150 MG 12 hr tablet Take 1 tablet (150 mg total) by mouth 2 (two) times daily. 60 tablet 0  . celecoxib (CELEBREX) 200 MG capsule Take 1 capsule (200 mg total) by mouth 2 (two) times daily. 60 capsule 6  . Choline Fenofibrate (FENOFIBRIC ACID) 135 MG CPDR TAKE 1 CAPSULE BY MOUTH DAILY 90 capsule 0  . escitalopram (LEXAPRO) 20 MG tablet Take 1 tablet (20 mg total) by mouth daily. 90 tablet 0  . finasteride (PROSCAR) 5 MG tablet TAKE 1 TABLET(5 MG) BY MOUTH DAILY 90 tablet 0  . fluticasone (FLONASE) 50 MCG/ACT nasal spray Place 2 sprays into both nostrils daily. 48 g 1  . gabapentin (NEURONTIN) 300 MG capsule Take 300 mg by mouth 3 (three) times daily.    [name] glucose blood (ONE TOUCH ULTRA TEST) test strip 1 each by Other route 3 (three) times daily. Test sugars up to three times daily. ( insurance preference  DX E11.65 E11.9) 100 each 11  . glucose blood test strip Per Insurance Preference- Test sugars up to three times daily DX E11.9 100 each 11  . insulin detemir (LEVEMIR  FLEXTOUCH) 100 UNIT/ML FlexPen ADMINISTER 56 UNITS UNDER THE SKIN EVERY DAY 15 mL 3  . MELATONIN PO Take by mouth.    . metFORMIN (GLUCOPHAGE) 1000 MG tablet TAKE 1 TABLET BY MOUTH TWICE DAILY WITH A MEAL 180 tablet 0  . Naproxen Sodium (ALEVE) 220 MG CAPS Take 2 capsules by mouth at bedtime as needed.      . Omega-3 Fatty Acids (FISH OIL) 1000 MG CAPS Take 1 capsule by mouth daily.     [name] SALINE NASAL SPRAY NA Place into the nose daily as needed.    . Semaglutide, 1 MG/DOSE, (OZEMPIC, 1 MG/DOSE,) 2 MG/1.5ML SOPN Inject 0.75 mLs (1 mg total) into the skin once a week. 7.5 mL 0  . tamsulosin (FLOMAX) 0.4 MG CAPS capsule TAKE 2 CAPSULES(0.8 MG) BY MOUTH AT BEDTIME 180 capsule 0  . vitamin B-12 (CYANOCOBALAMIN) 1000 MCG tablet Take 2,000 mcg by mouth daily.     . Vitamin D, Ergocalciferol, (DRISDOL) 1.25 MG (50000 UNIT) CAPS capsule Take 1 capsule (50,000 Units total) by mouth every 7 (seven) days. 4 capsule 0   No current facility-administered medications on file prior to visit.    ALLERGIES: Allergies  Allergen Reactions  . Simvastatin Other (See Comments)    myalgias  . Antihistamines, Diphenhydramine-Type Other (See Comments)    depression  . Codeine Nausea Only  . Penicillins Hives  . Sulfa Antibiotics Hives    FAMILY HISTORY: Family History  Problem Relation Age of Onset  . Heart disease Mother   . Hyperlipidemia Mother   . Stroke Mother   . Diabetes Mother   . Depression Mother   . Alzheimer's disease Mother   . Stroke Father   . Hyperlipidemia Father   . Heart disease Father   . Diabetes Father   . Hypertension Father   . Obesity Father   . Alcohol abuse Sister   . Depression Sister     SOCIAL HISTORY: Social History   Socioeconomic History  . Marital status: Married    Spouse name: Russ Looper  . Number of children: Not on file  . Years of education: Not on file  . Highest education level: Not on file  Occupational History  . Occupation: Retired    Tobacco Use  .  Smoking status: Former Research scientist (life sciences)  . Smokeless tobacco: Never Used  Vaping Use  . Vaping Use: Never used  Substance and Sexual Activity  . Alcohol use: Yes    Comment: social, 3-4 drinks month  . Drug use: No  . Sexual activity: Yes    Partners: Female  Other Topics Concern  . Not on file  Social History Narrative   Divorced, then remarried.  Has 3 sons, no grandchildren.   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   No tobacco.  Rare ETOH.  No drug abuse.   Enjoys reading and spending time with his 2 dogs.   Right handed    Social Determinants of Health   Financial Resource Strain:   . Difficulty of Paying Living Expenses: Not on file  Food Insecurity:   . Worried About Charity fundraiser in the Last Year: Not on file  . Ran Out of Food in the Last Year: Not on file  Transportation Needs:   . Lack of Transportation (Medical): Not on file  . Lack of Transportation (Non-Medical): Not on file  Physical Activity:   . Days of Exercise per Week: Not on file  . Minutes of Exercise per Session: Not on file  Stress:   . Feeling of Stress : Not on file  Social Connections:   . Frequency of Communication with Friends and Family: Not on file  . Frequency of Social Gatherings with Friends and Family: Not on file  . Attends Religious Services: Not on file  . Active Member of Clubs or Organizations: Not on file  . Attends Archivist Meetings: Not on file  . Marital Status: Not on file  Intimate Partner Violence:   . Fear of Current or Ex-Partner: Not on file  . Emotionally Abused: Not on file  . Physically Abused: Not on file  . Sexually Abused: Not on file    PHYSICAL EXAM: Vitals:   08/13/20 1115  BP: 138/72  Pulse: 94  SpO2: 96%   General: No acute distress Head:  Normocephalic/atraumatic Skin/Extremities: No rash, no edema Neurological Exam: alert and oriented to person, place,  and time. No aphasia or dysarthria. Fund of knowledge is appropriate.  Recent and remote memory are intact.  Attention and concentration are normal.    Cranial nerves: Pupils equal. Extraocular movements intact with no nystagmus. Visual fields full. No facial asymmetry. Motor: Bulk and tone normal, muscle strength 5/5 throughout with no pronator drift. Finger to nose testing intact.  Gait slightly wide-based, no ataxia.   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 MRI brain unremarkable. Neuropsychological evaluation normal, no evidence of a neurodegenerative process seen. Findings were discussed today, we reviewed his MRI brain. We discussed how he may be more sensitive to normal age-related cognitive decline,as well as symptoms where he would need to be more concerned about. We discussed the importance of control of vascular risk factors, physical exercise, and brain stimulation exercises for brain health. Follow-up as needed, he knows to call for any changes.   Thank you for allowing me to participate in his care.  Please do not hesitate to call for any questions or concerns.   Ellouise Newer, M.D.   CC: Dr. Anitra Lauth

## 2020-08-17 ENCOUNTER — Other Ambulatory Visit: Payer: Self-pay | Admitting: Family Medicine

## 2020-08-19 MED ORDER — METFORMIN HCL 1000 MG PO TABS: ORAL_TABLET | ORAL | 0 refills | Status: DC

## 2020-08-29 ENCOUNTER — Encounter (INDEPENDENT_AMBULATORY_CARE_PROVIDER_SITE_OTHER): Payer: Self-pay | Admitting: Physician Assistant

## 2020-08-29 ENCOUNTER — Ambulatory Visit (INDEPENDENT_AMBULATORY_CARE_PROVIDER_SITE_OTHER): Payer: Medicare Other | Admitting: Physician Assistant

## 2020-08-29 ENCOUNTER — Other Ambulatory Visit: Payer: Self-pay

## 2020-08-29 VITALS — BP 118/67 | HR 78 | Temp 98.2°F | Ht 70.0 in | Wt 229.0 lb

## 2020-08-29 DIAGNOSIS — F3289 Other specified depressive episodes: Secondary | ICD-10-CM | POA: Diagnosis not present

## 2020-08-29 DIAGNOSIS — E559 Vitamin D deficiency, unspecified: Secondary | ICD-10-CM | POA: Diagnosis not present

## 2020-08-29 DIAGNOSIS — E1169 Type 2 diabetes mellitus with other specified complication: Secondary | ICD-10-CM | POA: Diagnosis not present

## 2020-08-29 DIAGNOSIS — E7800 Pure hypercholesterolemia, unspecified: Secondary | ICD-10-CM | POA: Diagnosis not present

## 2020-08-29 DIAGNOSIS — E669 Obesity, unspecified: Secondary | ICD-10-CM

## 2020-08-29 DIAGNOSIS — E785 Hyperlipidemia, unspecified: Secondary | ICD-10-CM | POA: Diagnosis not present

## 2020-08-29 DIAGNOSIS — Z6834 Body mass index (BMI) 34.0-34.9, adult: Secondary | ICD-10-CM

## 2020-08-29 MED ORDER — OZEMPIC (1 MG/DOSE) 2 MG/1.5ML ~~LOC~~ SOPN: 1.0000 mg | PEN_INJECTOR | SUBCUTANEOUS | 0 refills | Status: DC

## 2020-08-29 MED ORDER — VITAMIN D (ERGOCALCIFEROL) 1.25 MG (50000 UNIT) PO CAPS: 50000.0000 [IU] | ORAL_CAPSULE | ORAL | 0 refills | Status: DC

## 2020-08-29 MED ORDER — BUPROPION HCL ER (SR) 150 MG PO TB12: 150.0000 mg | ORAL_TABLET | Freq: Two times a day (BID) | ORAL | 0 refills | Status: DC

## 2020-08-29 NOTE — Progress Notes (Signed)
Chief Complaint:   OBESITY [name] [name] is here to discuss his progress with his obesity treatment plan along with follow-up of his obesity related diagnoses. [name] [name] is on the Category 4 Plan and states he is following his eating plan approximately 80% of the time. [name] [name] states he is doing 0 minutes 0 times per week.  Today's visit was #: 76 Starting weight: 272 lbs Starting date: 02/14/2018 Today's weight: 229 lbs Today's date: 08/29/2020 Total lbs lost to date: 43 Total lbs lost since last in-office visit: 0  Interim History: [name] [name] has an appointment with the Neurosurgeon next week  regarding the plan for his severe back pain. He states that sometimes he  "falls off the wagon", especially when his pain is worse.  Subjective:   1. Vitamin D deficiency [name] [name] is on Vit D weekly, and he notes fatigue due to pain and sedentary.  2. Type 2 diabetes mellitus with hyperlipidemia ([name] [name]) [name] [name] is on Ozempic and Levemir. Last A1c was <7, and he denies hypoglycemia.  3. Other depression, with emotional eating  [name] [name] is on Wellbutrin, and he denies suicidal or homicidal ideas. He reports cravings when he is in pain or bored.  Assessment/Plan:   1. Vitamin D deficiency Low Vitamin D level contributes to fatigue and are associated with obesity, breast, and colon cancer. We will check labs today, and we will refill prescription Vitamin D for 1 month. [name] [name] will follow-up for routine testing of Vitamin D, at least 2-3 times per year to avoid over-replacement.  - VITAMIN D 25 Hydroxy (Vit-D Deficiency, Fractures) - Vitamin D, Ergocalciferol, (DRISDOL) 1.25 MG (50000 UNIT) CAPS capsule; Take 1 capsule (50,000 Units total) by mouth every 7 (seven) days.  Dispense: 4 capsule; Refill: 0  2. Type 2 diabetes mellitus with hyperlipidemia (HCC) Good blood sugar control is important to decrease the likelihood of diabetic complications such as nephropathy, neuropathy, limb loss, blindness, coronary  artery disease, and death. Intensive lifestyle modification including diet, exercise and weight loss are the first line of treatment for diabetes. We will check labs today. We will refill Ozempic with a 90 day supply, with no refill.  - Hemoglobin A1c - Insulin, random - Lipid panel - Comprehensive metabolic panel - Semaglutide, 1 MG/DOSE, (OZEMPIC, 1 MG/DOSE,) 2 MG/1.5ML SOPN; Inject 1 mg into the skin once a week.  Dispense: 7.5 mL; Refill: 0  3. Other depression, with emotional eating  Behavior modification techniques were discussed today to help [name] [name] deal with his emotional/non-hunger eating behaviors. We will refill Wellbutrin SR for 1 month. Orders and follow up as documented in patient record.   - buPROPion (WELLBUTRIN SR) 150 MG 12 hr tablet; Take 1 tablet (150 mg total) by mouth 2 (two) times daily.  Dispense: 60 tablet; Refill: 0  4. Class 1 obesity with serious comorbidity and body mass index (BMI) of 34.0 to 34.9 in adult, unspecified obesity type [name] [name] is currently in the action stage of change. As such, his goal is to continue with weight loss efforts. He has agreed to the Category 4 Plan.   Exercise goals: No exercise has been prescribed at this time.  Behavioral modification strategies: meal planning and cooking strategies and planning for success.  [name] [name] has agreed to follow-up with our clinic in 4 weeks. He was informed of the importance of frequent follow-up visits to maximize his success with intensive lifestyle modifications for his multiple health conditions.   [name] [name] was informed we would discuss his lab results at his  next visit unless there is a critical issue that needs to be addressed sooner. [name] [name] agreed to keep his next visit at the agreed upon time to discuss these results.  Objective:   Blood pressure 118/67, pulse 78, temperature 98.2 F (36.8 C), temperature source Oral, height 5\' 10"  (1.778 m), weight 229 lb (103.9 kg), SpO2 95 %. Body mass index is  32.86 kg/m.  General: Cooperative, alert, well developed, in no acute distress. HEENT: Conjunctivae and lids unremarkable. Cardiovascular: Regular rhythm.  Lungs: Normal work of breathing. Neurologic: No focal deficits.   Lab Results  Component Value Date   CREATININE 1.06 05/09/2020   BUN 23 05/09/2020   NA 141 05/09/2020   K 4.1 05/09/2020   CL 105 05/09/2020   CO2 26 05/09/2020   Lab Results  Component Value Date   ALT 13 05/09/2020   AST 9 05/09/2020   ALKPHOS 39 (L) 05/09/2020   BILITOT 0.3 05/09/2020   Lab Results  Component Value Date   HGBA1C 5.7 (H) 05/09/2020   HGBA1C 5.8 (H) 12/12/2019   HGBA1C 6.1 (H) 08/22/2019   HGBA1C 6.4 05/19/2019   HGBA1C 6.1 (H) 02/14/2019   Lab Results  Component Value Date   INSULIN 5.3 05/09/2020   Lab Results  Component Value Date   TSH 1.25 02/05/2020   Lab Results  Component Value Date   CHOL 112 05/09/2020   HDL 42 05/09/2020   LDLCALC 56 05/09/2020   LDLDIRECT 112.0 07/31/2016   TRIG 66 05/09/2020   CHOLHDL 3 08/10/2018   Lab Results  Component Value Date   WBC 7.2 02/14/2018   HGB 13.4 02/14/2018   HCT 39.3 02/14/2018   MCV 85 02/14/2018   PLT 211.0 05/21/2014   No results found for: IRON, TIBC, FERRITIN  Obesity Behavioral Intervention:   Approximately 15 minutes were spent on the discussion below.  ASK: We discussed the diagnosis of obesity with [name] [name] today and [name] [name] agreed to give [name] permission to discuss obesity behavioral modification therapy today.  ASSESS: [name] [name] has the diagnosis of obesity and his BMI today is 32.86. [name] [name] is in the action stage of change.   ADVISE: [name] [name] was educated on the multiple health risks of obesity as well as the benefit of weight loss to improve his health. He was advised of the need for long term treatment and the importance of lifestyle modifications to improve his current health and to decrease his risk of future health problems.  AGREE: Multiple dietary  modification options and treatment options were discussed and [name] [name] agreed to follow the recommendations documented in the above note.  ARRANGE: [name] [name] was educated on the importance of frequent visits to treat obesity as outlined per CMS and USPSTF guidelines and agreed to schedule his next follow up appointment today.  Attestation Statements:   Reviewed by clinician on day of visit: allergies, medications, problem list, medical history, surgical history, family history, social history, and previous encounter notes.   Wilhemena Durie, am acting as transcriptionist for [employer], PA-C.  I have reviewed the above documentation for accuracy and completeness, and I agree with the above. Abby Potash, PA-C

## 2020-08-30 LAB — LIPID PANEL
Chol/HDL Ratio: 2.9 ratio (ref 0.0–5.0)
Cholesterol, Total: 141 mg/dL (ref 100–199)
HDL: 49 mg/dL (ref >39)
LDL Chol Calc (NIH): 77 mg/dL (ref 0–99)
Triglycerides: 78 mg/dL (ref 0–149)
VLDL Cholesterol Cal: 15 mg/dL (ref 5–40)

## 2020-08-30 LAB — COMPREHENSIVE METABOLIC PANEL
ALT: 23 IU/L (ref 0–44)
AST: 17 IU/L (ref 0–40)
Albumin/Globulin Ratio: 1.9 (ref 1.2–2.2)
Albumin: 4.4 g/dL (ref 3.8–4.8)
Alkaline Phosphatase: 29 IU/L — ABNORMAL LOW (ref 44–121)
BUN/Creatinine Ratio: 25 — ABNORMAL HIGH (ref 10–24)
BUN: 26 mg/dL (ref 8–27)
Bilirubin Total: 0.3 mg/dL (ref 0.0–1.2)
CO2: 26 mmol/L (ref 20–29)
Calcium: 10 mg/dL (ref 8.6–10.2)
Chloride: 101 mmol/L (ref 96–106)
Creatinine, Ser: 1.02 mg/dL (ref 0.76–1.27)
GFR calc Af Amer: 88 mL/min/{1.73_m2} (ref >59)
GFR calc non Af Amer: 76 mL/min/{1.73_m2} (ref >59)
Globulin, Total: 2.3 g/dL (ref 1.5–4.5)
Glucose: 82 mg/dL (ref 65–99)
Potassium: 4.5 mmol/L (ref 3.5–5.2)
Sodium: 139 mmol/L (ref 134–144)
Total Protein: 6.7 g/dL (ref 6.0–8.5)

## 2020-08-30 LAB — HEMOGLOBIN A1C
Est. average glucose Bld gHb Est-mCnc: 123 mg/dL
Hgb A1c MFr Bld: 5.9 % — ABNORMAL HIGH (ref 4.8–5.6)

## 2020-08-30 LAB — INSULIN, RANDOM: INSULIN: 5.8 u[IU]/mL (ref 2.6–24.9)

## 2020-08-30 LAB — VITAMIN D 25 HYDROXY (VIT D DEFICIENCY, FRACTURES): Vit D, 25-Hydroxy: 54.6 ng/mL (ref 30.0–100.0)

## 2020-09-02 DIAGNOSIS — M5441 Lumbago with sciatica, right side: Secondary | ICD-10-CM | POA: Diagnosis not present

## 2020-09-02 DIAGNOSIS — M4316 Spondylolisthesis, lumbar region: Secondary | ICD-10-CM | POA: Diagnosis not present

## 2020-09-02 DIAGNOSIS — G8929 Other chronic pain: Secondary | ICD-10-CM | POA: Diagnosis not present

## 2020-09-02 DIAGNOSIS — M48061 Spinal stenosis, lumbar region without neurogenic claudication: Secondary | ICD-10-CM | POA: Diagnosis not present

## 2020-09-02 DIAGNOSIS — M5442 Lumbago with sciatica, left side: Secondary | ICD-10-CM | POA: Diagnosis not present

## 2020-09-02 DIAGNOSIS — M5416 Radiculopathy, lumbar region: Secondary | ICD-10-CM | POA: Diagnosis not present

## 2020-09-11 ENCOUNTER — Other Ambulatory Visit (INDEPENDENT_AMBULATORY_CARE_PROVIDER_SITE_OTHER): Payer: Self-pay | Admitting: Physician Assistant

## 2020-09-11 ENCOUNTER — Other Ambulatory Visit (HOSPITAL_COMMUNITY): Payer: Self-pay | Admitting: Neurosurgery

## 2020-09-11 DIAGNOSIS — M4316 Spondylolisthesis, lumbar region: Secondary | ICD-10-CM

## 2020-09-11 DIAGNOSIS — E119 Type 2 diabetes mellitus without complications: Secondary | ICD-10-CM

## 2020-09-11 DIAGNOSIS — Z794 Long term (current) use of insulin: Secondary | ICD-10-CM

## 2020-09-12 NOTE — Telephone Encounter (Signed)
Can I refill needles or would you like a refill protocol sheet on these? Please advise.

## 2020-09-16 ENCOUNTER — Encounter (INDEPENDENT_AMBULATORY_CARE_PROVIDER_SITE_OTHER): Payer: Self-pay

## 2020-09-16 NOTE — Telephone Encounter (Signed)
Message sent to pt.

## 2020-09-16 NOTE — Telephone Encounter (Signed)
You can refill these needles. Thanks

## 2020-09-18 ENCOUNTER — Ambulatory Visit (HOSPITAL_COMMUNITY)
Admission: RE | Admit: 2020-09-18 | Discharge: 2020-09-18 | Disposition: A | Discharge status: Home / Self Care | Payer: Medicare Other | Source: Ambulatory Visit | Attending: Neurosurgery | Admitting: Neurosurgery

## 2020-09-18 ENCOUNTER — Other Ambulatory Visit: Payer: Self-pay

## 2020-09-18 DIAGNOSIS — M545 Low back pain, unspecified: Secondary | ICD-10-CM | POA: Diagnosis not present

## 2020-09-18 DIAGNOSIS — M4316 Spondylolisthesis, lumbar region: Secondary | ICD-10-CM | POA: Insufficient documentation

## 2020-09-18 IMAGING — MR MR LUMBAR SPINE WO/W CM
6 of 7 series · 31 of 48 positions shown · IV contrast (gadavist)
Comparison: [DATE]

CLINICAL DATA: Chronic low back pain and left hip pain over the
last several years. Previous L4-5 laminectomy [7I].

EXAM:
MRI LUMBAR SPINE WITHOUT AND WITH CONTRAST
TECHNIQUE: Multiplanar and multiecho pulse sequences of the lumbar spine were
obtained without and with intravenous contrast.
CONTRAST:  10mL GADAVIST GADOBUTROL 1 MMOL/ML IV SOLN

[Series 5: T1 · sagittal · 4.0mm · 0.81mm/px · 4 of 15 slices shown (1 of 2)]
[im 1/15]
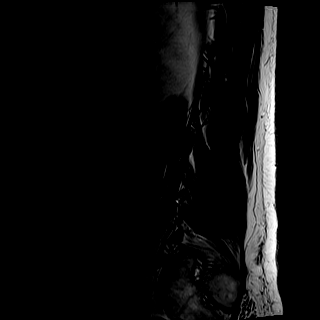
[im 5/15]
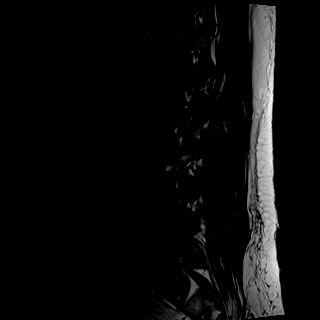
[im 10/15]
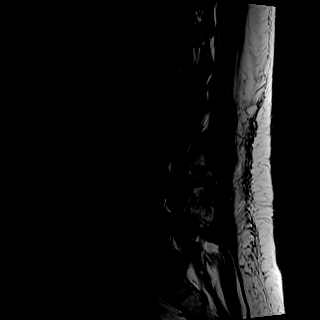
[im 15/15]
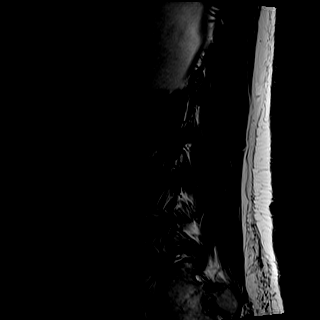

[Series 6: STIR · sagittal · 4.0mm · 0.51mm/px · 1 of 15 slices shown]
[im 1/15]
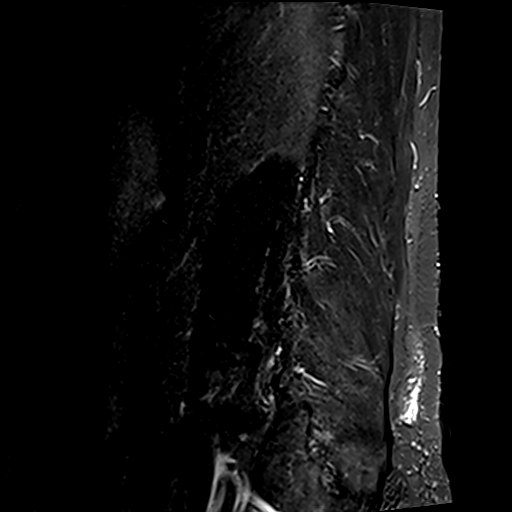

[Series 7: T2 · axial · 4.0mm · 0.70mm/px · z∈[-61,+120]mm · 8 of 33 slices shown (1 of 2)]
[im 1/33]
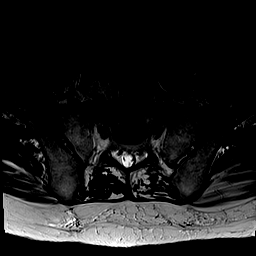
[im 4/33]
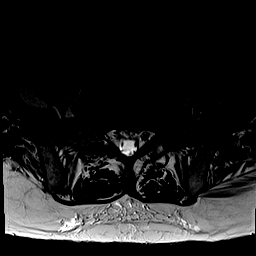
[im 11/33]
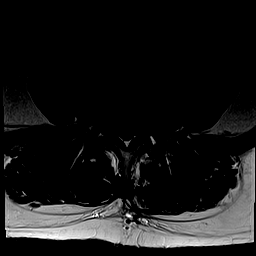
[im 15/33]
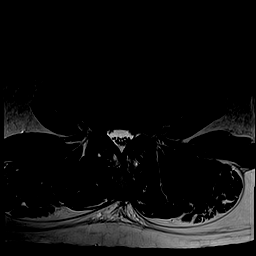
[im 18/33]
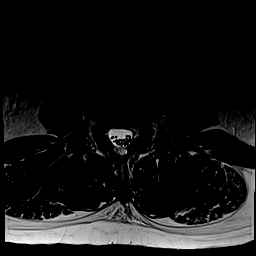
[im 22/33]
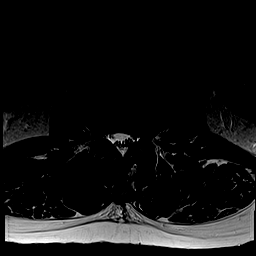
[im 29/33]
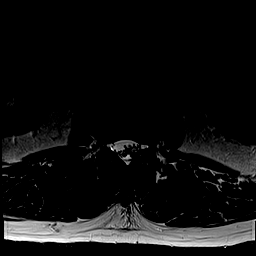
[im 33/33]
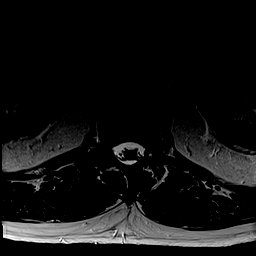

[Series 8: T1 · axial · 4.0mm · 0.35mm/px · z∈[-61,+120]mm · 8 of 33 slices shown (2 of 2)]
[im 1/33]
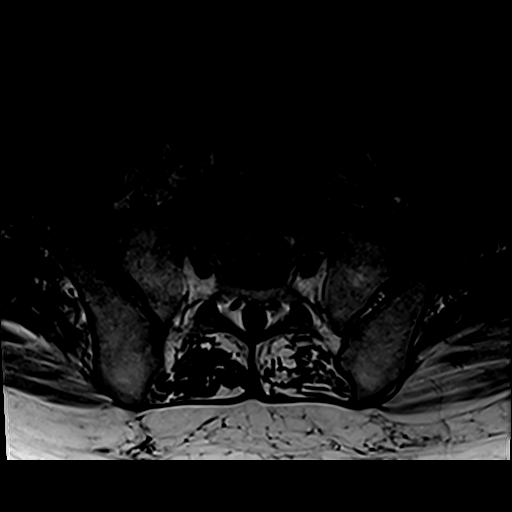
[im 4/33]
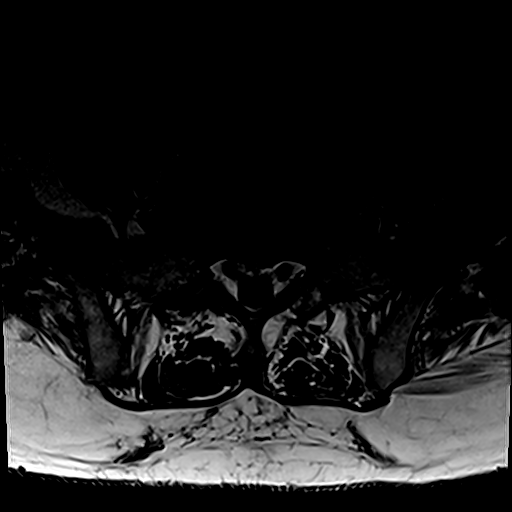
[im 11/33]
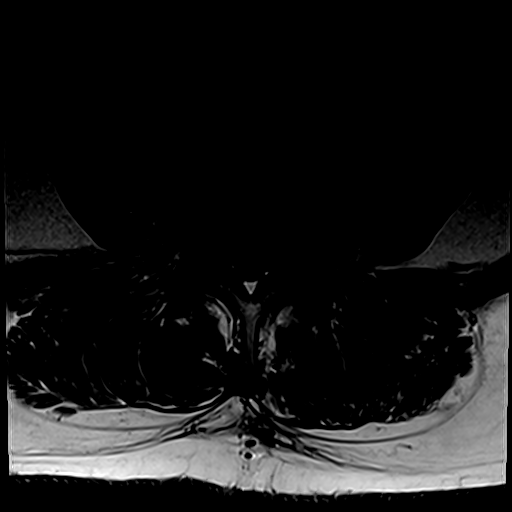
[im 15/33]
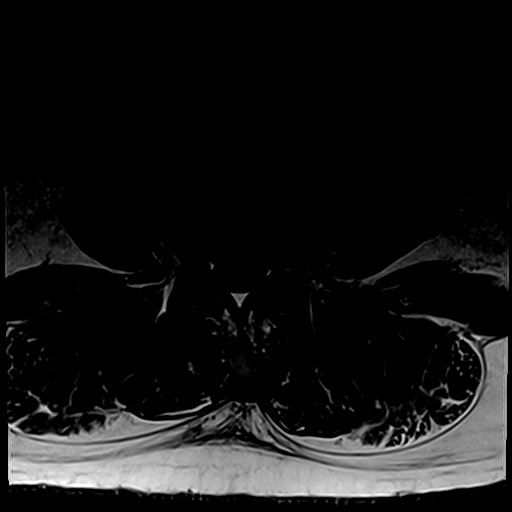
[im 18/33]
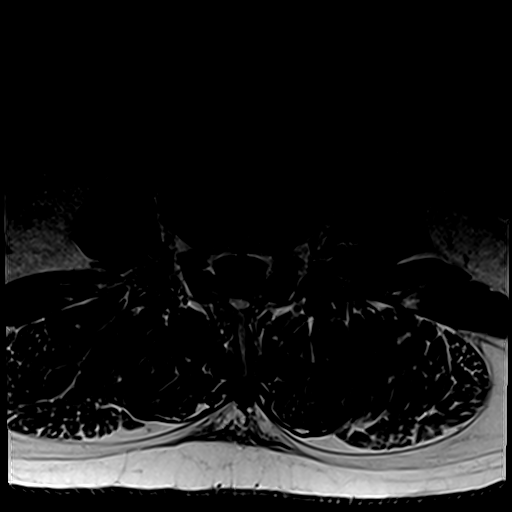
[im 22/33]
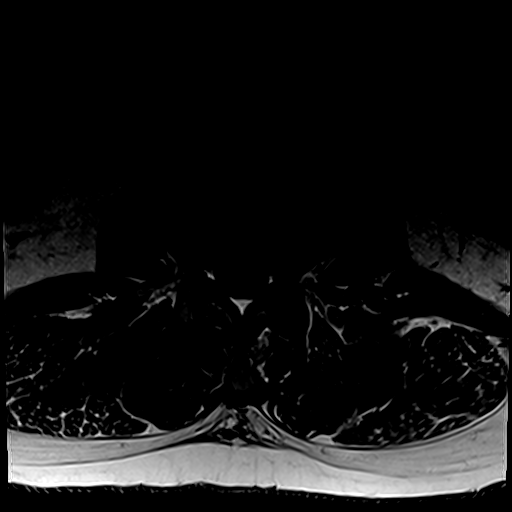
[im 29/33]
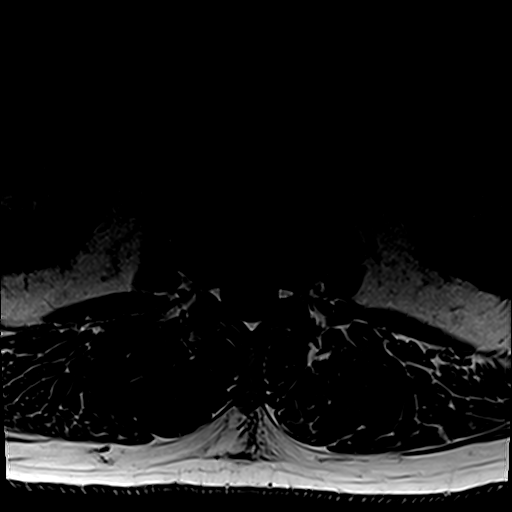
[im 33/33]
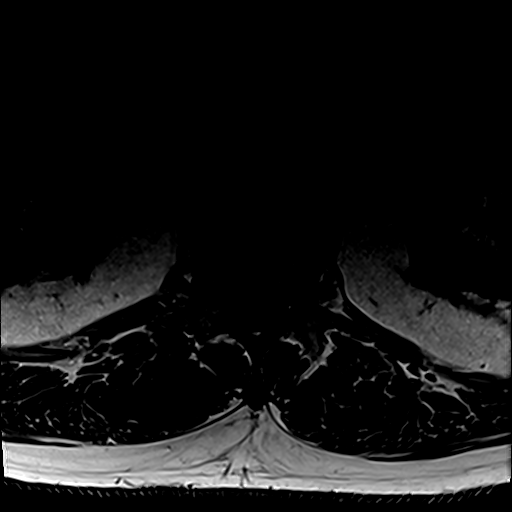

[Series 9: T2 · sagittal · 4.0mm · 0.68mm/px · 5 of 15 slices shown (2 of 2)]
[im 1/15]
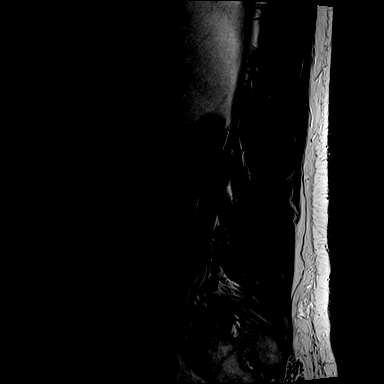
[im 4/15]
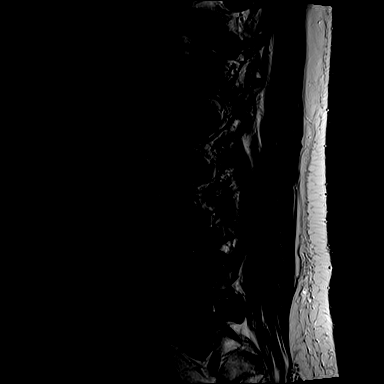
[im 8/15]
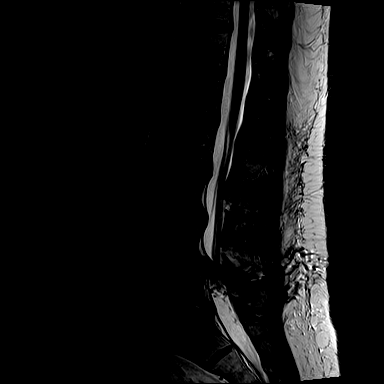
[im 11/15]
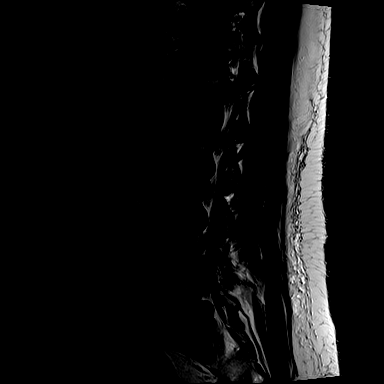
[im 15/15]
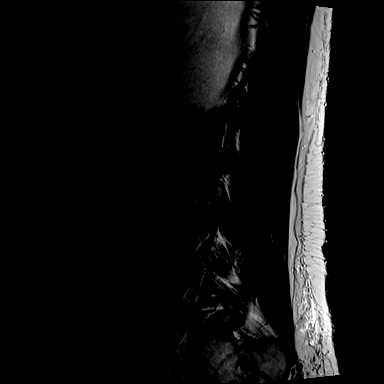

[Series 10: T1 fat-sat post-contrast · sagittal · 4.0mm · 0.81mm/px · 5 of 15 slices shown]
[im 1/15]
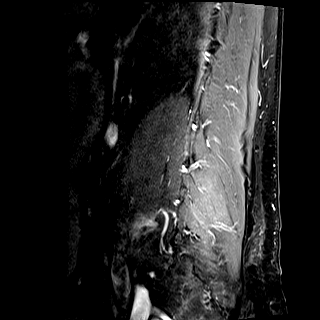
[im 4/15]
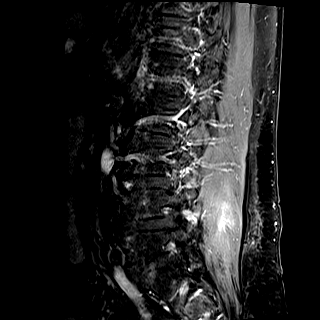
[im 8/15]
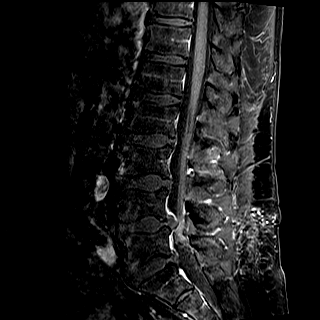
[im 11/15]
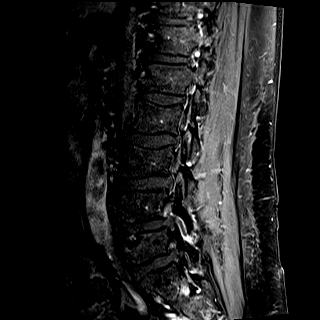
[im 15/15]
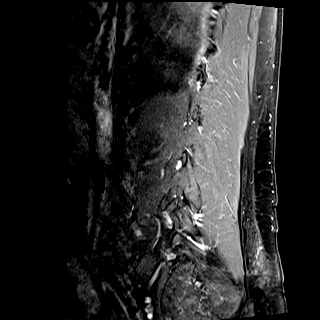

[31 of 48 positions shown; findings below may reference images not displayed]

FINDINGS: Segmentation: 5 lumbar type vertebral bodies as numbered previously.

Alignment:  2 mm degenerative anterolisthesis L4-5.

Vertebrae:  No fracture or primary bone lesion.

Conus medullaris and cauda equina: Conus extends to the L1-2 level.
Conus and cauda equina appear normal.

Paraspinal and other soft tissues: Negative

Disc levels:

No abnormality at L1-2 or above.

L2-3: Bulging of the disc slightly more prominent towards the left.
Mild narrowing of the lateral recesses but no apparent neural
compression. Slight worsening since [7I].

L3-4: Moderate bulging of the disc. Mild stenosis of both lateral
recesses and neural foramina. Similar appearance to the study of
last year. Definite neural compression is not established, but
neural irritation could occur.

L4-5: Bilateral facet arthropathy with 2 mm of anterolisthesis. Disc
protrusion more prominent towards the left. This has worsened since
last year. Severe stenosis of the canal and both neural foramina
that could cause neural compression on either or both sides.

L5-S1: Shallow protrusion of the disc. Bilateral facet degeneration
and hypertrophy. No canal stenosis. Bilateral foraminal stenosis
that could affect either exiting L5 nerve.
IMPRESSION: 1. Worsening of the findings at the L4-5 level. Bilateral facet
arthropathy with 2 mm of anterolisthesis. Protrusion of the disc
more prominent towards the left. Severe stenosis of the canal and
both neural foramina that could cause neural compression on either
or both sides.
2. L5-S1: Shallow protrusion of the disc. Bilateral facet
degeneration and hypertrophy. Bilateral foraminal stenosis that
could affect either exiting L5 nerve.
3. L3-4: Disc bulge. Bilateral lateral recess and foraminal
narrowing. No definite neural compression, but neural irritation
could occur.
4. L2-3: Disc bulge more prominent towards the left. Mild stenosis
of the lateral recesses but no distinct neural compression. Slight
worsening since [DATE].

## 2020-09-18 MED ORDER — GADOBUTROL 1 MMOL/ML IV SOLN
10.0000 mL | Freq: Once | INTRAVENOUS | Status: AC | PRN
  Administered 2020-09-18: 10 mL via INTRAVENOUS

## 2020-09-20 DIAGNOSIS — Z6834 Body mass index (BMI) 34.0-34.9, adult: Secondary | ICD-10-CM | POA: Diagnosis not present

## 2020-09-24 ENCOUNTER — Ambulatory Visit (INDEPENDENT_AMBULATORY_CARE_PROVIDER_SITE_OTHER): Payer: Medicare Other | Admitting: Physician Assistant

## 2020-09-29 ENCOUNTER — Other Ambulatory Visit: Payer: Self-pay | Admitting: Family Medicine

## 2020-09-30 ENCOUNTER — Other Ambulatory Visit: Payer: Self-pay

## 2020-09-30 ENCOUNTER — Ambulatory Visit (INDEPENDENT_AMBULATORY_CARE_PROVIDER_SITE_OTHER): Payer: Medicare Other | Admitting: Physician Assistant

## 2020-09-30 ENCOUNTER — Encounter (INDEPENDENT_AMBULATORY_CARE_PROVIDER_SITE_OTHER): Payer: Self-pay | Admitting: Physician Assistant

## 2020-09-30 VITALS — BP 109/69 | HR 78 | Temp 98.0°F | Ht 70.0 in | Wt 224.0 lb

## 2020-09-30 DIAGNOSIS — E559 Vitamin D deficiency, unspecified: Secondary | ICD-10-CM

## 2020-09-30 DIAGNOSIS — Z794 Long term (current) use of insulin: Secondary | ICD-10-CM

## 2020-09-30 DIAGNOSIS — F3289 Other specified depressive episodes: Secondary | ICD-10-CM | POA: Diagnosis not present

## 2020-09-30 DIAGNOSIS — E1169 Type 2 diabetes mellitus with other specified complication: Secondary | ICD-10-CM

## 2020-09-30 DIAGNOSIS — E669 Obesity, unspecified: Secondary | ICD-10-CM

## 2020-09-30 DIAGNOSIS — Z6832 Body mass index (BMI) 32.0-32.9, adult: Secondary | ICD-10-CM | POA: Diagnosis not present

## 2020-09-30 MED ORDER — VITAMIN D (ERGOCALCIFEROL) 1.25 MG (50000 UNIT) PO CAPS: 50000.0000 [IU] | ORAL_CAPSULE | ORAL | 0 refills | Status: DC

## 2020-09-30 MED ORDER — BUPROPION HCL ER (SR) 150 MG PO TB12: 150.0000 mg | ORAL_TABLET | Freq: Two times a day (BID) | ORAL | 0 refills | Status: DC

## 2020-09-30 MED ORDER — ESCITALOPRAM OXALATE 20 MG PO TABS: 20.0000 mg | ORAL_TABLET | Freq: Every day | ORAL | 0 refills | Status: DC

## 2020-09-30 MED ORDER — OZEMPIC (1 MG/DOSE) 2 MG/1.5ML ~~LOC~~ SOPN: 1.0000 mg | PEN_INJECTOR | SUBCUTANEOUS | 0 refills | Status: DC

## 2020-09-30 MED ORDER — BD PEN NEEDLE NANO 2ND GEN 32G X 4 MM MISC: 2 refills | Status: DC

## 2020-09-30 NOTE — Progress Notes (Signed)
Chief Complaint:   OBESITY [name] WITUCKI is here to discuss his progress with his obesity treatment plan along with follow-up of his obesity related diagnoses. [name] [name] is on the Category 4 Plan and states he is following his eating plan approximately 80% of the time. [name] [name] states he is exercising 0 minutes 0 times per week.  Today's visit was #: 93 Starting weight: 272 lbs Starting date: 02/14/2018 Today's weight: 224 lbs Today's date: 09/30/2020 Total lbs lost to date: 48 Total lbs lost since last in-office visit: 5  Interim History: [name] [name] states that he does not know how he lost 5 lbs eating the way he has the last few weeks. He journaled 2 weeks and was averaging 1900-2000 calories. He has added avocado and deviled eggs into his day.  Subjective:   Other depression, with emotional eating. [name] [name] is struggling with emotional eating and using food for comfort to the extent that it is negatively impacting his health. He has been working on behavior modification techniques to help reduce his emotional eating and has been somewhat successful. He shows no sign of suicidal or homicidal ideations. [name] [name] is on Wellbutrin and Lexapro. He reports intermittent cravings.  Vitamin D deficiency. [name] [name] is on prescription Vitamin D weekly. Last level was at goal.   Ref. Range 08/29/2020 09:43  Vitamin D, 25-Hydroxy Latest Ref Range: 30.0 - 100.0 ng/mL 54.6   Type 2 diabetes mellitus with other specified complication, with long-term current use of insulin ([place]). Last A1c 5.9. [name] [name] is on metformin and Ozempic and denies hypoglycemia.   Lab Results  Component Value Date   HGBA1C 5.9 (H) 08/29/2020   HGBA1C 5.7 (H) 05/09/2020   HGBA1C 5.8 (H) 12/12/2019   Lab Results  Component Value Date   MICROALBUR <0.7 05/16/2020   LDLCALC 77 08/29/2020   CREATININE 1.02 08/29/2020   Lab Results  Component Value Date   INSULIN 5.8 08/29/2020   INSULIN 5.3 05/09/2020   Assessment/Plan:    Other depression, with emotional eating. Behavior modification techniques were discussed today to help [name] [name] deal with his emotional/non-hunger eating behaviors.  Orders and follow up as documented in patient record. Refills were given for buPROPion (WELLBUTRIN SR) 150 MG 12 hr tablet #60 with 0 refills and escitalopram (LEXAPRO) 20 MG tablet #90 with 0 refills.  Vitamin D deficiency. Low Vitamin D level contributes to fatigue and are associated with obesity, breast, and colon cancer. He was given a refill on his Vitamin D, Ergocalciferol, (DRISDOL) 1.25 MG (50000 UNIT) CAPS capsule every week #4 with 0 refills and will follow-up for routine testing of Vitamin D, at least 2-3 times per year to avoid over-replacement.   Type 2 diabetes mellitus with other specified complication, with long-term current use of insulin ([place]). Good blood sugar control is important to decrease the likelihood of diabetic complications such as nephropathy, neuropathy, limb loss, blindness, coronary artery disease, and death. Intensive lifestyle modification including diet, exercise and weight loss are the first line of treatment for diabetes. Refills were given for Semaglutide, 1 MG/DOSE, (OZEMPIC, 1 MG/DOSE,) 2 MG/1.5ML SOPN #12 (90 days) and Insulin Pen Needle (BD PEN NEEDLE NANO 2ND GEN) 32G X 4 MM MISC #100 with 2 refills.  Class 1 obesity with serious comorbidity and body mass index (BMI) of 32.0 to 32.9 in adult, unspecified obesity type.  [name] [name] is currently in the action stage of change. As such, his goal is to continue with weight loss efforts. He has agreed to keeping  a food journal and adhering to recommended goals of 1800-1900 calories and 115 grams of protein daily.   Exercise goals: Older adults should follow the adult guidelines. When older adults cannot meet the adult guidelines, they should be as physically active as their abilities and conditions will allow.   Behavioral modification strategies: meal planning  and cooking strategies and better snacking choices.  [name] [name] has agreed to follow-up with our clinic in 4 weeks. He was informed of the importance of frequent follow-up visits to maximize his success with intensive lifestyle modifications for his multiple health conditions.   Objective:   Blood pressure 109/69, pulse 78, temperature 98 F (36.7 C), height 5\' 10"  (1.778 m), weight 224 lb (101.6 kg), SpO2 97 %. Body mass index is 32.14 kg/m.  General: Cooperative, alert, well developed, in no acute distress. HEENT: Conjunctivae and lids unremarkable. Cardiovascular: Regular rhythm.  Lungs: Normal work of breathing. Neurologic: No focal deficits.   Lab Results  Component Value Date   CREATININE 1.02 08/29/2020   BUN 26 08/29/2020   NA 139 08/29/2020   K 4.5 08/29/2020   CL 101 08/29/2020   CO2 26 08/29/2020   Lab Results  Component Value Date   ALT 23 08/29/2020   AST 17 08/29/2020   ALKPHOS 29 (L) 08/29/2020   BILITOT 0.3 08/29/2020   Lab Results  Component Value Date   HGBA1C 5.9 (H) 08/29/2020   HGBA1C 5.7 (H) 05/09/2020   HGBA1C 5.8 (H) 12/12/2019   HGBA1C 6.1 (H) 08/22/2019   HGBA1C 6.4 05/19/2019   Lab Results  Component Value Date   INSULIN 5.8 08/29/2020   INSULIN 5.3 05/09/2020   Lab Results  Component Value Date   TSH 1.25 02/05/2020   Lab Results  Component Value Date   CHOL 141 08/29/2020   HDL 49 08/29/2020   LDLCALC 77 08/29/2020   LDLDIRECT 112.0 07/31/2016   TRIG 78 08/29/2020   CHOLHDL 2.9 08/29/2020   Lab Results  Component Value Date   WBC 7.2 02/14/2018   HGB 13.4 02/14/2018   HCT 39.3 02/14/2018   MCV 85 02/14/2018   PLT 211.0 05/21/2014   No results found for: IRON, TIBC, FERRITIN  Obesity Behavioral Intervention:   Approximately 15 minutes were spent on the discussion below.  ASK: We discussed the diagnosis of obesity with [name] [name] today and [name] [name] agreed to give [name] permission to discuss obesity behavioral modification therapy  today.  ASSESS: [name] [name] has the diagnosis of obesity and his BMI today is 32.3. [name] [name] is in the action stage of change.   ADVISE: [name] [name] was educated on the multiple health risks of obesity as well as the benefit of weight loss to improve his health. He was advised of the need for long term treatment and the importance of lifestyle modifications to improve his current health and to decrease his risk of future health problems.  AGREE: Multiple dietary modification options and treatment options were discussed and [name] [name] agreed to follow the recommendations documented in the above note.  ARRANGE: [name] [name] was educated on the importance of frequent visits to treat obesity as outlined per CMS and USPSTF guidelines and agreed to schedule his next follow up appointment today.  Attestation Statements:   Reviewed by clinician on day of visit: allergies, medications, problem list, medical history, surgical history, family history, social history, and previous encounter notes.  IMichaelene Song, am acting as transcriptionist for Abby Potash, PA-C   I have reviewed the above documentation for accuracy and completeness,  and I agree with the above. Abby Potash, PA-C

## 2020-10-07 DIAGNOSIS — Z23 Encounter for immunization: Secondary | ICD-10-CM | POA: Diagnosis not present

## 2020-10-13 ENCOUNTER — Other Ambulatory Visit: Payer: Self-pay | Admitting: Family Medicine

## 2020-10-14 ENCOUNTER — Encounter (INDEPENDENT_AMBULATORY_CARE_PROVIDER_SITE_OTHER): Payer: Self-pay

## 2020-10-14 ENCOUNTER — Other Ambulatory Visit: Payer: Self-pay | Admitting: Neurosurgery

## 2020-10-23 DIAGNOSIS — M48061 Spinal stenosis, lumbar region without neurogenic claudication: Secondary | ICD-10-CM | POA: Diagnosis not present

## 2020-10-23 DIAGNOSIS — M5416 Radiculopathy, lumbar region: Secondary | ICD-10-CM | POA: Diagnosis not present

## 2020-10-23 DIAGNOSIS — Z6833 Body mass index (BMI) 33.0-33.9, adult: Secondary | ICD-10-CM | POA: Diagnosis not present

## 2020-10-23 DIAGNOSIS — M5442 Lumbago with sciatica, left side: Secondary | ICD-10-CM | POA: Diagnosis not present

## 2020-10-23 DIAGNOSIS — M4316 Spondylolisthesis, lumbar region: Secondary | ICD-10-CM | POA: Diagnosis not present

## 2020-10-23 DIAGNOSIS — I10 Essential (primary) hypertension: Secondary | ICD-10-CM | POA: Diagnosis not present

## 2020-10-28 ENCOUNTER — Ambulatory Visit (INDEPENDENT_AMBULATORY_CARE_PROVIDER_SITE_OTHER): Payer: Medicare Other | Admitting: Physician Assistant

## 2020-11-05 ENCOUNTER — Encounter: Payer: Self-pay | Admitting: Family Medicine

## 2020-11-07 ENCOUNTER — Other Ambulatory Visit (INDEPENDENT_AMBULATORY_CARE_PROVIDER_SITE_OTHER): Payer: Self-pay | Admitting: Physician Assistant

## 2020-11-07 DIAGNOSIS — F3289 Other specified depressive episodes: Secondary | ICD-10-CM

## 2020-11-07 NOTE — Telephone Encounter (Signed)
This patient was last seen by Abby Potash, PA-C, and currently does not have an upcoming appt scheduled with her.

## 2020-11-12 ENCOUNTER — Other Ambulatory Visit: Payer: Self-pay | Admitting: Family Medicine

## 2020-11-19 ENCOUNTER — Encounter: Payer: Self-pay | Admitting: Family Medicine

## 2020-11-19 ENCOUNTER — Ambulatory Visit (INDEPENDENT_AMBULATORY_CARE_PROVIDER_SITE_OTHER): Payer: Medicare Other | Admitting: Physician Assistant

## 2020-11-19 ENCOUNTER — Encounter (INDEPENDENT_AMBULATORY_CARE_PROVIDER_SITE_OTHER): Payer: Self-pay | Admitting: Physician Assistant

## 2020-11-19 ENCOUNTER — Other Ambulatory Visit: Payer: Self-pay

## 2020-11-19 VITALS — BP 111/67 | HR 82 | Temp 98.0°F | Ht 70.0 in | Wt 224.0 lb

## 2020-11-19 DIAGNOSIS — F3289 Other specified depressive episodes: Secondary | ICD-10-CM

## 2020-11-19 DIAGNOSIS — Z6832 Body mass index (BMI) 32.0-32.9, adult: Secondary | ICD-10-CM

## 2020-11-19 DIAGNOSIS — E669 Obesity, unspecified: Secondary | ICD-10-CM

## 2020-11-19 DIAGNOSIS — E559 Vitamin D deficiency, unspecified: Secondary | ICD-10-CM

## 2020-11-19 MED ORDER — VITAMIN D (ERGOCALCIFEROL) 1.25 MG (50000 UNIT) PO CAPS: 50000.0000 [IU] | ORAL_CAPSULE | ORAL | 0 refills | Status: DC

## 2020-11-19 MED ORDER — BUPROPION HCL ER (SR) 150 MG PO TB12: 150.0000 mg | ORAL_TABLET | Freq: Two times a day (BID) | ORAL | 0 refills | Status: DC

## 2020-11-19 NOTE — Telephone Encounter (Signed)
Noted.  EMR updated.

## 2020-11-20 NOTE — Progress Notes (Signed)
Chief Complaint:   OBESITY [name] [name] is here to discuss his progress with his obesity treatment plan along with follow-up of his obesity related diagnoses. [name] [name] is on keeping a food journal and adhering to recommended goals of 1800-1900 calories and 115 grams of protein and states he is following his eating plan approximately 85% of the time. [name] [name] states he is not exercising regularly at this time.  Today's visit was #: 40 Starting weight: 272 lbs Starting date: 02/14/2018 Today's weight: 224 lbs Today's date: 11/19/2020 Total lbs lost to date: 48 lbs Total lbs lost since last in-office visit: 0  Interim History: [name] [name] states that he gained a few pounds trying to reach his protein goal the past few weeks and since has lost that weight.  He is not drinking enough water.  He is having back surgery on January 4.  Subjective:   1. Vitamin D deficiency [name] [name]'s Vitamin D level was 54.6 on 08/29/2020. He is currently taking prescription vitamin D 50,000 IU each week. He denies nausea, vomiting or muscle weakness.  2. Other depression, with emotional eating  No SI/HI.  Continues to report cravings.  Assessment/Plan:   1. Vitamin D deficiency Low Vitamin D level contributes to fatigue and are associated with obesity, breast, and colon cancer. He agrees to continue to take prescription Vitamin D @50 ,000 IU every week and will follow-up for routine testing of Vitamin D, at least 2-3 times per year to avoid over-replacement.  -Refill Vitamin D, Ergocalciferol, (DRISDOL) 1.25 MG (50000 UNIT) CAPS capsule; Take 1 capsule (50,000 Units total) by mouth every 7 (seven) days.  Dispense: 12 capsule; Refill: 0  2. Other depression, with emotional eating  Will refill Wellbutrin 150 mg today.  Behavior modification techniques were discussed today to help [name] [name] deal with his emotional/non-hunger eating behaviors.  Orders and follow up as documented in patient record.   -Refill buPROPion  (WELLBUTRIN SR) 150 MG 12 hr tablet; Take 1 tablet (150 mg total) by mouth 2 (two) times daily.  Dispense: 180 tablet; Refill: 0  3. Class 1 obesity with serious comorbidity and body mass index (BMI) of 32.0 to 32.9 in adult, unspecified obesity type  [name] [name] is currently in the action stage of change. As such, his goal is to continue with weight loss efforts. He has agreed to keeping a food journal and adhering to recommended goals of 1800-2000 calories and 120 protein.   Exercise goals: Older adults should follow the adult guidelines. When older adults cannot meet the adult guidelines, they should be as physically active as their abilities and conditions will allow.  Older adults should do exercises that maintain or improve balance if they are at risk of falling.   Behavioral modification strategies: increasing water intake and holiday eating strategies .  [name] [name] has agreed to follow-up with our clinic in 6 weeks. He was informed of the importance of frequent follow-up visits to maximize his success with intensive lifestyle modifications for his multiple health conditions.   Objective:   Blood pressure 111/67, pulse 82, temperature 98 F (36.7 C), height 5\' 10"  (1.778 m), weight 224 lb (101.6 kg), SpO2 98 %. Body mass index is 32.14 kg/m.  General: Cooperative, alert, well developed, in no acute distress. HEENT: Conjunctivae and lids unremarkable. Cardiovascular: Regular rhythm.  Lungs: Normal work of breathing. Neurologic: No focal deficits.   Lab Results  Component Value Date   CREATININE 1.02 08/29/2020   BUN 26 08/29/2020   NA 139 08/29/2020  K 4.5 08/29/2020   CL 101 08/29/2020   CO2 26 08/29/2020   Lab Results  Component Value Date   ALT 23 08/29/2020   AST 17 08/29/2020   ALKPHOS 29 (L) 08/29/2020   BILITOT 0.3 08/29/2020   Lab Results  Component Value Date   HGBA1C 5.9 (H) 08/29/2020   HGBA1C 5.7 (H) 05/09/2020   HGBA1C 5.8 (H) 12/12/2019   HGBA1C 6.1 (H)  08/22/2019   HGBA1C 6.4 05/19/2019   Lab Results  Component Value Date   INSULIN 5.8 08/29/2020   INSULIN 5.3 05/09/2020   Lab Results  Component Value Date   TSH 1.25 02/05/2020   Lab Results  Component Value Date   CHOL 141 08/29/2020   HDL 49 08/29/2020   LDLCALC 77 08/29/2020   LDLDIRECT 112.0 07/31/2016   TRIG 78 08/29/2020   CHOLHDL 2.9 08/29/2020   Lab Results  Component Value Date   WBC 7.2 02/14/2018   HGB 13.4 02/14/2018   HCT 39.3 02/14/2018   MCV 85 02/14/2018   PLT 211.0 05/21/2014   Obesity Behavioral Intervention:   Approximately 15 minutes were spent on the discussion below.  ASK: We discussed the diagnosis of obesity with [name] [name] today and [name] [name] agreed to give [name] permission to discuss obesity behavioral modification therapy today.  ASSESS: [name] [name] has the diagnosis of obesity and his BMI today is 32.3. [name] [name] is in the action stage of change.   ADVISE: [name] [name] was educated on the multiple health risks of obesity as well as the benefit of weight loss to improve his health. He was advised of the need for long term treatment and the importance of lifestyle modifications to improve his current health and to decrease his risk of future health problems.  AGREE: Multiple dietary modification options and treatment options were discussed and [name] [name] agreed to follow the recommendations documented in the above note.  ARRANGE: [name] [name] was educated on the importance of frequent visits to treat obesity as outlined per CMS and USPSTF guidelines and agreed to schedule his next follow up appointment today.  Attestation Statements:   Reviewed by clinician on day of visit: allergies, medications, problem list, medical history, surgical history, family history, social history, and previous encounter notes.  I, Water quality scientist, CMA, am acting as transcriptionist for Abby Potash, PA-C  I have reviewed the above documentation for accuracy and completeness, and I agree with  the above. Abby Potash, PA-C

## 2020-11-21 ENCOUNTER — Encounter: Payer: Self-pay | Admitting: Family Medicine

## 2020-11-21 ENCOUNTER — Other Ambulatory Visit: Payer: Self-pay

## 2020-11-21 ENCOUNTER — Ambulatory Visit (INDEPENDENT_AMBULATORY_CARE_PROVIDER_SITE_OTHER): Payer: Medicare Other | Admitting: Family Medicine

## 2020-11-21 VITALS — BP 109/68 | HR 76 | Temp 97.4°F | Resp 16 | Ht 70.0 in | Wt 230.4 lb

## 2020-11-21 DIAGNOSIS — E118 Type 2 diabetes mellitus with unspecified complications: Secondary | ICD-10-CM

## 2020-11-21 DIAGNOSIS — F411 Generalized anxiety disorder: Secondary | ICD-10-CM

## 2020-11-21 DIAGNOSIS — M545 Low back pain, unspecified: Secondary | ICD-10-CM

## 2020-11-21 DIAGNOSIS — E782 Mixed hyperlipidemia: Secondary | ICD-10-CM

## 2020-11-21 DIAGNOSIS — G8929 Other chronic pain: Secondary | ICD-10-CM

## 2020-11-21 DIAGNOSIS — Z0000 Encounter for general adult medical examination without abnormal findings: Secondary | ICD-10-CM

## 2020-11-21 DIAGNOSIS — J300 Vasomotor rhinitis: Secondary | ICD-10-CM

## 2020-11-21 DIAGNOSIS — F339 Major depressive disorder, recurrent, unspecified: Secondary | ICD-10-CM

## 2020-11-21 MED ORDER — ASPIRIN 81 MG PO TBEC: 81.0000 mg | DELAYED_RELEASE_TABLET | Freq: Every day | ORAL | 12 refills | Status: DC

## 2020-11-21 MED ORDER — AZELASTINE HCL 0.1 % NA SOLN: 2.0000 | Freq: Two times a day (BID) | NASAL | 11 refills | Status: DC

## 2020-11-21 NOTE — Progress Notes (Signed)
OFFICE VISIT  11/21/2020  CC:  Chief Complaint  Patient presents with  . Follow-up    RCI, pt is fasting.     HPI:    Patient is a 69 y.o. Caucasian male who presents for 6 mo f/u DM 2, mixed HLD, recurrent MDD + anxiety. A/P as of last visit: "1) BPH with obstruction/lower urinary tract sx's: not responsive to flomax and finasteride.  No sign of OAB component.  He'd like to delay any urol referral/consideration of TURP, etc until he gets his back situation figured out better.  We'll see if a renal u/s shows any sign of hydronephrosis and if not then we'll just hold off on urol referral for now.  Renal function has been normal. Checking urine microalb/cr today as part of DM routine monitoring. Continue flomax 0.8 mg qhs and finasteride 5mg  qd. 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  Consider recurrence of this as cause of his current sx's.  May benefit from cystourethrography, retrograde urethrogram, etc--advance urologic diagnostic testing.  Will call pt back and present this option to him.  2) Prostate ca screening: DRE unremarkable today. PSA ordered.  3) DM 2: good control.  HbA1c 5.7% 1 wk ago! Urine microalb/cr today. A1c's being followed by his non-surg bariatric clinic. Continue levemir, metformin, and ozempic.  4) HLD: tolerating atorvastatin and fenofibrate. Recent lipids excellent and hepatic panel normal.  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  Ongoing mgmt with Dr. Vertell Limber. I'll do trial of celebrex 200mg  bid for him. No opioids b/c these may cause worsening of his bladder emptying problems.  6) Hx of adenomatous colon polyps: due for repeat colonoscopy as of 2020---he'll put this on the back burner for now."  INTERIM HX: L spine surgery planned for 12/03/20 (Dr. Vertell Limber) and pt looking forward to this. Labs from 08/29/20 done by his non-bariatric surg  clinic provider all excellent (see labs section below). Pt has plans for pre-op labs through his neurosurgeon.  BACK PAIN: chronic, not on any opioids. Rarely takes naproxen. Celebrex 400 mg qhs --somewhat helpful, esp in regard to allowing him to rest/sleep better.    Activity: limited by his back pain.  He occ walks up stairs, gets winded with lots of steps but NO CP and he has good recovery of breathing with brief rest. No dizziness. Using CPAP nightly. No home bp monitoring.  DM: fasting gluc's 70s-90s.  Occ up to 150s. Levemir 56. Ozempic 1mg  q week. Metformin 1000 mg bid. HLD: tolerating atorva and fenofibric acid.  Anx/dep: anxiety level is fine, mood stable--no depression.  Obstructive urinary sx's: nocturia x 1 usually. Poor stream chronically, has to sit to urinate, drips a long time to empty. Occ concentrated urine. Cut back on diet soda.  Not good at water intake.   Past Medical History:  Diagnosis Date  . Allergic rhinitis, unspecified    Allergy testing via allergist 04/2017--mostly neg.  Dr. Donneta Romberg recommended referral to ENT so i did this.  . Back pain    LBP->DDD/spondylosis w/ intermittent radiulopathy-type leg pains, + bilat hip and thigh pain-->spinal stenosis L4-5, L5-S1->Dr. Vertell Limber to do decomp and fusion surgery, scheduled for 12/03/20  . BPH (benign prostatic hypertrophy)    no hydro on renal u/s 05/2020  . Chronic nasal congestion   . DDD (degenerative disc disease), lumbar 2010   laminectomy 01/2009.  MR rpt 05/2019->advanced DDD/spondylosis L3-S1, with mod/sev  L4-5 spinal stenosis-->plan for surg per Dr. Vertell Limber as of 09/2020  . Depression    Hospitalized for suicidal ideation 79.  Has been on lexapro since 10/2008.  . Diabetes mellitus type II    Dx'd approximately 06/2008.  No retinopathy as of 02/2017 ophth.  . Erectile dysfunction   . Heart murmur, systolic [id number]   mild intensity, asymptomatic; echo 01/2018 valves fine->obs  . History of colon polyps  01/2016   Recall 3 yrs  . History of pneumonia 2008   Hospitalized  . History of scarlet fever    age 6  . Hyperlipidemia, mixed 1993   myalgias on pravastatin  . Hypertension 2009   "marked chronic cardiomegaly" on CXR 01/2009 per old records.  . 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 Insomnia   . Keratoconus    dx'd 1973  . Memory changes   . Mild cognitive impairment with memory loss 01/2020 eval with Dr. Delice Lesch   MRI brain 03/01/20 NORMAL  . OSA on CPAP 2004; 2019   Back on CPAP 04/2018.  Compliance great as of 01/2020 neuro/sleep f/u  . Osteoarthritis of both knees    Mild plain film changes in medial compartment bilat  . Restless leg    gabapentin helpful  . Rhinitis, chronic   . 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 APPENDECTOMY  1972  . COLONOSCOPY  X 4   Most recent was 2007 [place], [place]), with removal of polyps in the first two.  02/21/16: tubular adenoma.  Recall 3 yrs (Dr. Ardis Hughs).  . 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 ELECTROCARDIOGRAM  01/29/2009   NORMAL  . LUMBAR LAMINECTOMY  01/2009  . TONSILLECTOMY AND ADENOIDECTOMY     age 70  . TRANSTHORACIC ECHOCARDIOGRAM  02/22/2018   EF 55-60%, grd I DD  . URETHRAL DILATION     2 as a child, one as an adult  . VASECTOMY  1994  . VITRECTOMY      Outpatient Medications Prior to Visit  Medication Sig Dispense Refill  . atorvastatin (LIPITOR) 80 MG tablet TAKE 1 TABLET(80 MG) BY MOUTH DAILY 90 tablet 0  . buPROPion (WELLBUTRIN SR) 150 MG 12 hr tablet Take 1 tablet (150 mg total) by mouth 2 (two) times daily. 180 tablet 0  . celecoxib (CELEBREX) 200 MG capsule Take 1 capsule (200 mg total) by mouth 2 (two) times daily. 60 capsule 6  . Choline Fenofibrate (FENOFIBRIC ACID) 135 MG CPDR TAKE 1 CAPSULE BY MOUTH DAILY 90 capsule 0  . escitalopram (LEXAPRO) 20 MG tablet Take 1 tablet (20 mg total) by  mouth daily. 90 tablet 0  . finasteride (PROSCAR) 5 MG tablet TAKE 1 TABLET(5 MG) BY MOUTH DAILY 90 tablet 0  . fluticasone (FLONASE) 50 MCG/ACT nasal spray Place 2 sprays into both nostrils daily. 48 g 1  . gabapentin (NEURONTIN) 300 MG capsule Take 300 mg by mouth 3 (three) times daily.    [name] glucose blood (ONE TOUCH ULTRA TEST) test strip 1 each by Other route 3 (three) times daily. Test sugars up to three times daily. ( insurance preference  DX E11.65 E11.9) 100 each 11  . glucose blood test strip Per Insurance Preference- Test sugars up to three times daily DX E11.9 100 each 11  . Insulin Pen Needle (BD PEN NEEDLE NANO  2ND GEN) 32G X 4 MM MISC USE TO INJECT 4 TIMES DAILY 100 each 2  . LEVEMIR FLEXTOUCH 100 UNIT/ML FlexPen ADMINISTER 56 UNITS UNDER THE SKIN EVERY DAY 15 mL 3  . MELATONIN PO Take by mouth.    . metFORMIN (GLUCOPHAGE) 1000 MG tablet TAKE 1 TABLET BY MOUTH TWICE DAILY WITH A MEAL 180 tablet 0  . Naproxen Sodium 220 MG CAPS Take 2 capsules by mouth at bedtime as needed.    . Omega-3 Fatty Acids (FISH OIL) 1000 MG CAPS Take 1 capsule by mouth daily.     [name] SALINE NASAL SPRAY NA Place into the nose daily as needed.    . Semaglutide, 1 MG/DOSE, (OZEMPIC, 1 MG/DOSE,) 2 MG/1.5ML SOPN Inject 1 mg into the skin once a week. 18 mL 0  . tamsulosin (FLOMAX) 0.4 MG CAPS capsule TAKE 2 CAPSULES(0.8 MG) BY MOUTH AT BEDTIME 180 capsule 0  . vitamin B-12 (CYANOCOBALAMIN) 1000 MCG tablet Take 2,000 mcg by mouth daily.    . Vitamin D, Ergocalciferol, (DRISDOL) 1.25 MG (50000 UNIT) CAPS capsule Take 1 capsule (50,000 Units total) by mouth every 7 (seven) days. 12 capsule 0  . aspirin 325 MG tablet Take 325 mg by mouth daily.     No facility-administered medications prior to visit.    Allergies  Allergen Reactions  . Simvastatin Other (See Comments)    myalgias  . Antihistamines, Diphenhydramine-Type Other (See Comments)    depression  . Codeine Nausea Only  . Penicillins Hives  . Sulfa  Antibiotics Hives    ROS As per HPI  PE: Vitals with BMI 11/21/2020 11/19/2020 09/30/2020  Height 5\' 10"  5\' 10"  5\' 10"   Weight 230 lbs 6 oz 224 lbs 224 lbs  BMI 33.06 [id number] [id number]  Systolic 0000000 [id number]  Diastolic 68 67 69  Pulse 76 82 78   Gen: Alert, well appearing.  Patient is oriented to person, place, time, and situation. AFFECT: pleasant, lucid thought and speech. Nose: scant crusty mucous, no signif turbinate edema/erythema.   CV: RRR, 2/6 syst murmur best heart at RUSB and extends up into R infraclav area.  No diastolic murmur.  No r/g.   LUNGS: CTA bilat, nonlabored resps, good aeration in all lung fields. EXT: no clubbing or cyanosis.  no edema.    LABS:  Lab Results  Component Value Date   TSH 1.25 02/05/2020   Lab Results  Component Value Date   WBC 7.2 02/14/2018   HGB 13.4 02/14/2018   HCT 39.3 02/14/2018   MCV 85 02/14/2018   PLT 211.0 05/21/2014   Lab Results  Component Value Date   CREATININE 1.02 08/29/2020   BUN 26 08/29/2020   NA 139 08/29/2020   K 4.5 08/29/2020   CL 101 08/29/2020   CO2 26 08/29/2020   Lab Results  Component Value Date   ALT 23 08/29/2020   AST 17 08/29/2020   ALKPHOS 29 (L) 08/29/2020   BILITOT 0.3 08/29/2020   Lab Results  Component Value Date   CHOL 141 08/29/2020   Lab Results  Component Value Date   HDL 49 08/29/2020   Lab Results  Component Value Date   LDLCALC 77 08/29/2020   Lab Results  Component Value Date   TRIG 78 08/29/2020   Lab Results  Component Value Date   CHOLHDL 2.9 08/29/2020   Lab Results  Component Value Date   PSA 0.08 (L) 05/16/2020   PSA 0.04 (L) 05/19/2019   PSA 0.04 (L)  05/09/2018   Lab Results  Component Value Date   HGBA1C 5.9 (H) 08/29/2020    IMPRESSION AND PLAN:  1) DM, well controlled. Cont metformin 1000 mg bid, ozempic 1mg  q week, levemir 56 U qd. Bariatric clinic has been following his labs and plan is f/u with them again 12/2020. No labs today.  2) HLD:  tolerating atorva 80mg  qd and fenofibrate 135mg  qd. [facility] lipid panel 08/29/20 excellent.  3) GAD and recurrent MDD: stable long term on buprop sr 150mg  bid and lexapro 20mg  qd. Continue.  4) Chronic LBP/spondylosis/stenosis--->for surg 12/03/20 with Dr. Vertell Limber.  5) Systolic heart murmur: asymptomatic. Present back in 2019 when echo showed no valvular abnormality. Plan f/u echo later in 2022.  6) Preventative health:  All vaccines UTD. Prostate ca screening UTD--repeat PSA 6 mo. Colon ca screening: Hx of adenomatous colon polyps: due for repeat colonoscopy as of 2020---he's been holding off while getting back issue worked out. Likely will seek to get rpt colonoscopy latter part of 2022.  7) Vasomotor rhinitis: discussed at tail end of visit today. Decided to try astelin nasal spray 2 sprays each nostril bid.  An After Visit Summary was printed and given to the patient.  FOLLOW UP: Return in about 6 months (around 05/22/2021) for annual CPE (fasting) + RCI.  Signed:  Crissie Sickles, MD           11/21/2020

## 2020-11-25 ENCOUNTER — Other Ambulatory Visit: Payer: Self-pay | Admitting: Family Medicine

## 2020-11-28 ENCOUNTER — Encounter: Payer: Self-pay | Admitting: Family Medicine

## 2020-11-28 ENCOUNTER — Other Ambulatory Visit: Payer: Self-pay

## 2020-11-28 MED ORDER — LEVEMIR FLEXTOUCH 100 UNIT/ML ~~LOC~~ SOPN
56.0000 [IU] | PEN_INJECTOR | Freq: Every evening | SUBCUTANEOUS | 6 refills | Status: DC
Start: 2020-11-28 — End: 2021-05-22

## 2020-11-28 NOTE — Progress Notes (Signed)
[facility] [phone] - [place], [place] - [address] AT [facility] OF [address]  [place] [address] Phone: [phone] Fax: [phone]      Your procedure is scheduled on Tuesday, January 4th.  Report to [facility] Main Entrance "A" at 9:15 A.M., and check in at the Admitting office.  Call this number if you have problems the morning of surgery:  [phone]  Call [phone] if you have any questions prior to your surgery date Monday-Friday 8am-4pm    Remember:  Do not eat after midnight the night before your surgery  You may drink clear liquids until 8:15 AM the morning of your surgery.   Clear liquids allowed are: Water, Non-Citrus Juices (without pulp), Carbonated Beverages, Clear Tea, Black Coffee Only, and Gatorade    Take these medicines the morning of surgery with A SIP OF WATER   Atorvastatin (Lipitor)  Nasal Spray - if needed  Bupropion (Wellbutrin)  Escitalopram (Lexapro)  Gabapentin (Neurontin)   Follow your surgeon's instructions on when to stop Aspirin.  If no instructions were given by your surgeon then you will need to call the office to get those instructions.    As of today, STOP taking any Aspirin (unless otherwise instructed by your surgeon) Aleve, Naproxen, Ibuprofen, Motrin, Advil, Goody's, BC's, all herbal medications, fish oil, and all vitamins.   WHAT DO I DO ABOUT MY DIABETES MEDICATION?   [name] Do not take oral diabetes medicines (pills) the morning of surgery. - Metformin  . THE NIGHT BEFORE SURGERY, take 23 units (1/2 of normal dose) of Levemir.      . THE MORNING OF SURGERY, do not take Levemir.  The day of surgery, do not take other diabetes injectables, including Semaglutide (Ozempic)   HOW TO MANAGE YOUR DIABETES BEFORE AND AFTER SURGERY  Why is it important to control my blood sugar before and after surgery? . Improving blood sugar levels before and after surgery helps healing and can limit  problems. . A way of improving blood sugar control is eating a healthy diet by: o  Eating less sugar and carbohydrates o  Increasing activity/exercise o  Talking with your doctor about reaching your blood sugar goals . High blood sugars (greater than 180 mg/dL) can raise your risk of infections and slow your recovery, so you will need to focus on controlling your diabetes during the weeks before surgery. . Make sure that the doctor who takes care of your diabetes knows about your planned surgery including the date and location.  How do I manage my blood sugar before surgery? . Check your blood sugar at least 4 times a day, starting 2 days before surgery, to make sure that the level is not too high or low. . Check your blood sugar the morning of your surgery when you wake up and every 2 hours until you get to the Short Stay unit. o If your blood sugar is less than 70 mg/dL, you will need to treat for low blood sugar: - Do not take insulin. - Treat a low blood sugar (less than 70 mg/dL) with  cup of clear juice (cranberry or apple), 4 glucose tablets, OR glucose gel. - Recheck blood sugar in 15 minutes after treatment (to make sure it is greater than 70 mg/dL). If your blood sugar is not greater than 70 mg/dL on recheck, call [phone] for further instructions. . Report your blood sugar to the short stay nurse when you get to Short Stay.  [name]  If you are admitted to the hospital after surgery: o Your blood sugar will be checked by the staff and you will probably be given insulin after surgery (instead of oral diabetes medicines) to make sure you have good blood sugar levels. o The goal for blood sugar control after surgery is 80-180 mg/dL.               DAY OF SURGERY:            Do not wear jewelry            Do not wear lotions, powders, colognes, or deodorant.            Men may shave face and neck.            Do not bring valuables to the hospital.            [facility] is not  responsible for any belongings or valuables.  Do NOT Smoke (Tobacco/Vaping) or drink Alcohol 24 hours prior to your procedure If you use a CPAP at night, you may bring all equipment for your overnight stay.   Contacts, glasses, dentures or bridgework may not be worn into surgery.      For patients admitted to the hospital, discharge time will be determined by your treatment team.   Patients discharged the day of surgery will not be allowed to drive home, and someone needs to stay with them for 24 hours.    Special instructions:   [name]- Preparing For Surgery  Before surgery, you can play an important role. Because skin is not sterile, your skin needs to be as free of germs as possible. You can reduce the number of germs on your skin by washing with CHG (chlorahexidine gluconate) Soap before surgery.  CHG is an antiseptic cleaner which kills germs and bonds with the skin to continue killing germs even after washing.    Oral Hygiene is also important to reduce your risk of infection.  Remember - BRUSH YOUR TEETH THE MORNING OF SURGERY WITH YOUR REGULAR TOOTHPASTE  Please do not use if you have an allergy to CHG or antibacterial soaps. If your skin becomes reddened/irritated stop using the CHG.  Do not shave (including legs and underarms) for at least 48 hours prior to first CHG shower. It is OK to shave your face.  Please follow these instructions carefully.   1. Shower the NIGHT BEFORE SURGERY and the MORNING OF SURGERY with CHG Soap.   2. If you chose to wash your hair, wash your hair first as usual with your normal shampoo.  3. After you shampoo, rinse your hair and body thoroughly to remove the shampoo.  4. Use CHG as you would any other liquid soap. You can apply CHG directly to the skin and wash gently with a scrungie or a clean washcloth.   5. Apply the CHG Soap to your body ONLY FROM THE NECK DOWN.  Do not use on open wounds or open sores. Avoid contact with your eyes,  ears, mouth and genitals (private parts). Wash Face and genitals (private parts)  with your normal soap.   6. Wash thoroughly, paying special attention to the area where your surgery will be performed.  7. Thoroughly rinse your body with warm water from the neck down.  8. DO NOT shower/wash with your normal soap after using and rinsing off the CHG Soap.  9. Pat yourself dry with a CLEAN TOWEL.  10. Wear CLEAN PAJAMAS to  bed the night before surgery  11. Place CLEAN SHEETS on your bed the night of your first shower and DO NOT SLEEP WITH PETS.   Day of Surgery: Wear Clean/Comfortable clothing the morning of surgery Do not apply any deodorants/lotions.   Remember to brush your teeth WITH YOUR REGULAR TOOTHPASTE.   Please read over the following fact sheets that you were given.

## 2020-11-28 NOTE — Telephone Encounter (Signed)
RF request for levemir flextouch LOV: 11/21/20 Next ov: 05/22/21 Last written: 09/30/20 (70mL,3)  Please advise, thanks.

## 2020-11-28 NOTE — Telephone Encounter (Signed)
Request sent to PCP

## 2020-12-02 ENCOUNTER — Other Ambulatory Visit (HOSPITAL_COMMUNITY)
Admission: RE | Admit: 2020-12-02 | Discharge: 2020-12-02 | Disposition: A | Discharge status: Home / Self Care | Payer: Medicare Other | Source: Ambulatory Visit | Attending: Neurosurgery | Admitting: Neurosurgery

## 2020-12-02 ENCOUNTER — Encounter (HOSPITAL_COMMUNITY): Payer: Self-pay

## 2020-12-02 ENCOUNTER — Other Ambulatory Visit: Payer: Self-pay

## 2020-12-02 ENCOUNTER — Encounter (HOSPITAL_COMMUNITY)
Admission: RE | Admit: 2020-12-02 | Discharge: 2020-12-02 | Disposition: A | Discharge status: Home / Self Care | Payer: Medicare Other | Source: Ambulatory Visit | Attending: Neurosurgery | Admitting: Neurosurgery

## 2020-12-02 DIAGNOSIS — Z20822 Contact with and (suspected) exposure to covid-19: Secondary | ICD-10-CM | POA: Insufficient documentation

## 2020-12-02 DIAGNOSIS — Z01812 Encounter for preprocedural laboratory examination: Secondary | ICD-10-CM | POA: Insufficient documentation

## 2020-12-02 DIAGNOSIS — Z01818 Encounter for other preprocedural examination: Secondary | ICD-10-CM | POA: Diagnosis not present

## 2020-12-02 DIAGNOSIS — I4510 Unspecified right bundle-branch block: Secondary | ICD-10-CM | POA: Insufficient documentation

## 2020-12-02 LAB — CBC
HCT: 38.3 % — ABNORMAL LOW (ref 39.0–52.0)
Hemoglobin: 12.2 g/dL — ABNORMAL LOW (ref 13.0–17.0)
MCH: 28.2 pg (ref 26.0–34.0)
MCHC: 31.9 g/dL (ref 30.0–36.0)
MCV: 88.5 fL (ref 80.0–100.0)
Platelets: 271 10*3/uL (ref 150–400)
RBC: 4.33 MIL/uL (ref 4.22–5.81)
RDW: 13.2 % (ref 11.5–15.5)
WBC: 6.4 10*3/uL (ref 4.0–10.5)
nRBC: 0 % (ref 0.0–0.2)

## 2020-12-02 LAB — BASIC METABOLIC PANEL
Anion gap: 8 (ref 5–15)
BUN: 16 mg/dL (ref 8–23)
CO2: 26 mmol/L (ref 22–32)
Calcium: 9.2 mg/dL (ref 8.9–10.3)
Chloride: 103 mmol/L (ref 98–111)
Creatinine, Ser: 0.94 mg/dL (ref 0.61–1.24)
GFR, Estimated: >60 mL/min (ref >60)
Glucose, Bld: 90 mg/dL (ref 70–99)
Potassium: 3.9 mmol/L (ref 3.5–5.1)
Sodium: 137 mmol/L (ref 135–145)

## 2020-12-02 LAB — SURGICAL PCR SCREEN
MRSA, PCR: NEGATIVE
Staphylococcus aureus: POSITIVE — AB

## 2020-12-02 LAB — TYPE AND SCREEN
ABO/RH(D): B NEG
Antibody Screen: NEGATIVE

## 2020-12-02 LAB — GLUCOSE, CAPILLARY: Glucose-Capillary: 79 mg/dL (ref 70–99)

## 2020-12-02 LAB — HEMOGLOBIN A1C
Hgb A1c MFr Bld: 5.3 % (ref 4.8–5.6)
Mean Plasma Glucose: 105.41 mg/dL

## 2020-12-02 MED ORDER — VANCOMYCIN HCL 1500 MG/300ML IV SOLN
1500.0000 mg | INTRAVENOUS | Status: AC
  Administered 2020-12-03: 1500 mg via INTRAVENOUS
  Filled 2020-12-02: qty 300

## 2020-12-02 NOTE — Progress Notes (Signed)
[facility] [phone] - [place], [place] - [address] AT [facility] OF [address] [place] [address] Phone: [phone] Fax: [phone]      Your procedure is scheduled on Tuesday, January 4th.  Report to [facility] Main Entrance "A" at 9:15 A.M., and check in at the Admitting office.  Call this number if you have problems the morning of surgery:  [phone]  Call [phone] if you have any questions prior to your surgery date Monday-Friday 8am-4pm    Remember:  Do not eat after midnight the night before your surgery    Take these medicines the morning of surgery with A SIP OF WATER   Atorvastatin (Lipitor)  Nasal Spray - if needed  Bupropion (Wellbutrin)  Escitalopram (Lexapro)  Gabapentin (Neurontin)   Follow your surgeon's instructions on when to stop Aspirin.  If no instructions were given by your surgeon then you will need to call the office to get those instructions.    As of today, STOP taking any Aspirin (unless otherwise instructed by your surgeon) Aleve, Naproxen, Ibuprofen, Motrin, Advil, Goody's, BC's, all herbal medications, fish oil, and all vitamins.   WHAT DO I DO ABOUT MY DIABETES MEDICATION?   [name] Do not take oral diabetes medicines (pills) the morning of surgery. - Metformin  . THE NIGHT BEFORE SURGERY, take 28 units (1/2 of normal dose) of Levemir.      The day of surgery, do not take other diabetes injectables, including Semaglutide (Ozempic)   HOW TO MANAGE YOUR DIABETES BEFORE AND AFTER SURGERY  Why is it important to control my blood sugar before and after surgery? . Improving blood sugar levels before and after surgery helps healing and can limit problems. . A way of improving blood sugar control is eating a healthy diet by: o  Eating less sugar and carbohydrates o  Increasing activity/exercise o  Talking with your doctor about reaching your blood sugar goals . High blood sugars (greater than 180  mg/dL) can raise your risk of infections and slow your recovery, so you will need to focus on controlling your diabetes during the weeks before surgery. . Make sure that the doctor who takes care of your diabetes knows about your planned surgery including the date and location.  How do I manage my blood sugar before surgery? . Check your blood sugar at least 4 times a day, starting 2 days before surgery, to make sure that the level is not too high or low. . Check your blood sugar the morning of your surgery when you wake up and every 2 hours until you get to the Short Stay unit. o If your blood sugar is less than 70 mg/dL, you will need to treat for low blood sugar: - Do not take insulin. - Treat a low blood sugar (less than 70 mg/dL) with  cup of clear juice (cranberry or apple), 4 glucose tablets, OR glucose gel. - Recheck blood sugar in 15 minutes after treatment (to make sure it is greater than 70 mg/dL). If your blood sugar is not greater than 70 mg/dL on recheck, call [phone] for further instructions. . Report your blood sugar to the short stay nurse when you get to Short Stay.  . If you are admitted to the hospital after surgery: o Your blood sugar will be checked by the staff and you will probably be given insulin after surgery (instead of oral diabetes medicines) to make sure you have good blood sugar levels.  o The goal for blood sugar control after surgery is 80-180 mg/dL.               DAY OF SURGERY:            Do not wear jewelry            Do not wear lotions, powders, colognes, or deodorant.            Men may shave face and neck.            Do not bring valuables to the hospital.            [facility] is not responsible for any belongings or valuables.  Do NOT Smoke (Tobacco/Vaping) or drink Alcohol 24 hours prior to your procedure If you use a CPAP at night, you may bring all equipment for your overnight stay.   Contacts, glasses, dentures or bridgework may not be  worn into surgery.      For patients admitted to the hospital, discharge time will be determined by your treatment team.   Patients discharged the day of surgery will not be allowed to drive home, and someone needs to stay with them for 24 hours.    Special instructions:   [facility]- Preparing For Surgery  Before surgery, you can play an important role. Because skin is not sterile, your skin needs to be as free of germs as possible. You can reduce the number of germs on your skin by washing with CHG (chlorahexidine gluconate) Soap before surgery.  CHG is an antiseptic cleaner which kills germs and bonds with the skin to continue killing germs even after washing.    Oral Hygiene is also important to reduce your risk of infection.  Remember - BRUSH YOUR TEETH THE MORNING OF SURGERY WITH YOUR REGULAR TOOTHPASTE  Please do not use if you have an allergy to CHG or antibacterial soaps. If your skin becomes reddened/irritated stop using the CHG.  Do not shave (including legs and underarms) for at least 48 hours prior to first CHG shower. It is OK to shave your face.  Please follow these instructions carefully.   1. Shower the NIGHT BEFORE SURGERY and the MORNING OF SURGERY with CHG Soap.   2. If you chose to wash your hair, wash your hair first as usual with your normal shampoo.  3. After you shampoo, rinse your hair and body thoroughly to remove the shampoo.  4. Use CHG as you would any other liquid soap. You can apply CHG directly to the skin and wash gently with a scrungie or a clean washcloth.   5. Apply the CHG Soap to your body ONLY FROM THE NECK DOWN.  Do not use on open wounds or open sores. Avoid contact with your eyes, ears, mouth and genitals (private parts). Wash Face and genitals (private parts)  with your normal soap.   6. Wash thoroughly, paying special attention to the area where your surgery will be performed.  7. Thoroughly rinse your body with warm water from the neck  down.  8. DO NOT shower/wash with your normal soap after using and rinsing off the CHG Soap.  9. Pat yourself dry with a CLEAN TOWEL.  10. Wear CLEAN PAJAMAS to bed the night before surgery  11. Place CLEAN SHEETS on your bed the night of your first shower and DO NOT SLEEP WITH PETS.   Day of Surgery: Wear Clean/Comfortable clothing the morning of surgery Do not apply any deodorants/lotions.  Remember to brush your teeth WITH YOUR REGULAR TOOTHPASTE.   Please read over the following fact sheets that you were given.

## 2020-12-02 NOTE — Progress Notes (Signed)
PCP - Dr. Nicoletta Ba Cardiologist - denies  Chest x-ray - n/a EKG - 12/02/20 Stress Test [place]. Pt states it was normal and no f/u needed.  ECHO - 2019; heart murmur suspected. Dr. Milinda Cave stated no immediate actions indicated and will be monitored.  Cardiac Cath - denies  Sleep Study - OSA+ CPAP - uses nightly, instructed to bring CPAP to hospital on the day of surgery.   Fasting Blood Sugar - 79-95 Checks Blood Sugar 1 time a day. CBG at PAT 79 Last A1C-5.9 in 9/21. Will repeat today.  Blood Thinner Instructions: n/a Aspirin Instructions: LD 11/24/20  COVID TEST- 12/02/20, pt verbalizes understanding of quarantine guidelines.   Anesthesia review: No  Patient denies shortness of breath, fever, cough and chest pain at PAT appointment   All instructions explained to the patient, with a verbal understanding of the material. Patient agrees to go over the instructions while at home for a better understanding. Patient also instructed to self quarantine after being tested for COVID-19. The opportunity to ask questions was provided.   Coronavirus Screening  Have you experienced the following symptoms:  Cough yes/no: No Fever (>100.74F)  yes/no: No Runny nose yes/no: No Sore throat yes/no: No Difficulty breathing/shortness of breath  yes/no: No  Have you or a family member traveled in the last 14 days and where? yes/no: No   If the patient indicates "YES" to the above questions, their PAT will be rescheduled to limit the exposure to others and, the surgeon will be notified. THE PATIENT WILL NEED TO BE ASYMPTOMATIC FOR 14 DAYS.   If the patient is not experiencing any of these symptoms, the PAT nurse will instruct them to NOT bring anyone with them to their appointment since they may have these symptoms or traveled as well.   Please remind your patients and families that hospital visitation restrictions are in effect and the importance of the restrictions.

## 2020-12-03 ENCOUNTER — Inpatient Hospital Stay (HOSPITAL_COMMUNITY)
Admission: RE | Disposition: A | Discharge status: Home / Self Care | Payer: Self-pay | Source: Home / Self Care | Attending: Neurosurgery

## 2020-12-03 ENCOUNTER — Inpatient Hospital Stay (HOSPITAL_COMMUNITY): Payer: Medicare Other

## 2020-12-03 ENCOUNTER — Other Ambulatory Visit: Payer: Self-pay

## 2020-12-03 ENCOUNTER — Encounter (HOSPITAL_COMMUNITY): Payer: Self-pay | Admitting: Neurosurgery

## 2020-12-03 ENCOUNTER — Inpatient Hospital Stay (HOSPITAL_COMMUNITY): Payer: Medicare Other | Admitting: Anesthesiology

## 2020-12-03 ENCOUNTER — Inpatient Hospital Stay (HOSPITAL_COMMUNITY): Payer: Medicare Other | Admitting: Physician Assistant

## 2020-12-03 ENCOUNTER — Observation Stay (HOSPITAL_COMMUNITY)
Admission: RE | Admit: 2020-12-03 | Discharge: 2020-12-04 | Disposition: A | Discharge status: Home / Self Care | Payer: Medicare Other | Attending: Neurosurgery | Admitting: Neurosurgery

## 2020-12-03 DIAGNOSIS — Z79899 Other long term (current) drug therapy: Secondary | ICD-10-CM | POA: Insufficient documentation

## 2020-12-03 DIAGNOSIS — Z419 Encounter for procedure for purposes other than remedying health state, unspecified: Secondary | ICD-10-CM

## 2020-12-03 DIAGNOSIS — M48061 Spinal stenosis, lumbar region without neurogenic claudication: Principal | ICD-10-CM | POA: Insufficient documentation

## 2020-12-03 DIAGNOSIS — M4316 Spondylolisthesis, lumbar region: Secondary | ICD-10-CM | POA: Diagnosis not present

## 2020-12-03 DIAGNOSIS — M5117 Intervertebral disc disorders with radiculopathy, lumbosacral region: Secondary | ICD-10-CM | POA: Diagnosis not present

## 2020-12-03 DIAGNOSIS — I10 Essential (primary) hypertension: Secondary | ICD-10-CM | POA: Diagnosis not present

## 2020-12-03 DIAGNOSIS — E119 Type 2 diabetes mellitus without complications: Secondary | ICD-10-CM | POA: Insufficient documentation

## 2020-12-03 DIAGNOSIS — Z7982 Long term (current) use of aspirin: Secondary | ICD-10-CM | POA: Diagnosis not present

## 2020-12-03 DIAGNOSIS — Z7984 Long term (current) use of oral hypoglycemic drugs: Secondary | ICD-10-CM | POA: Insufficient documentation

## 2020-12-03 DIAGNOSIS — M5116 Intervertebral disc disorders with radiculopathy, lumbar region: Secondary | ICD-10-CM | POA: Diagnosis not present

## 2020-12-03 DIAGNOSIS — M549 Dorsalgia, unspecified: Secondary | ICD-10-CM | POA: Diagnosis present

## 2020-12-03 DIAGNOSIS — M4727 Other spondylosis with radiculopathy, lumbosacral region: Secondary | ICD-10-CM | POA: Insufficient documentation

## 2020-12-03 DIAGNOSIS — M5442 Lumbago with sciatica, left side: Secondary | ICD-10-CM | POA: Insufficient documentation

## 2020-12-03 HISTORY — PX: POSTERIOR LUMBAR FUSION: SHX6036

## 2020-12-03 LAB — SARS CORONAVIRUS 2 (TAT 6-24 HRS): SARS Coronavirus 2: NEGATIVE

## 2020-12-03 LAB — GLUCOSE, CAPILLARY
Glucose-Capillary: 96 mg/dL (ref 70–99)
Glucose-Capillary: 81 mg/dL (ref 70–99)
Glucose-Capillary: 147 mg/dL — ABNORMAL HIGH (ref 70–99)
Glucose-Capillary: 163 mg/dL — ABNORMAL HIGH (ref 70–99)

## 2020-12-03 LAB — ABO/RH: ABO/RH(D): B NEG

## 2020-12-03 IMAGING — RF DG LUMBAR SPINE 2-3V
1 series · 3 of 3 positions shown · non-contrast
Comparison: MRI of the lumbar spine [DATE].

CLINICAL DATA: ELECTIVE SURGERY.  Posterior interbody fusion.

EXAM:
LUMBAR SPINE - 2-3 VIEW; DG C-ARM 1-60 MIN

[Series 1: run · 3 of 3 slices shown]
[im 1/3]
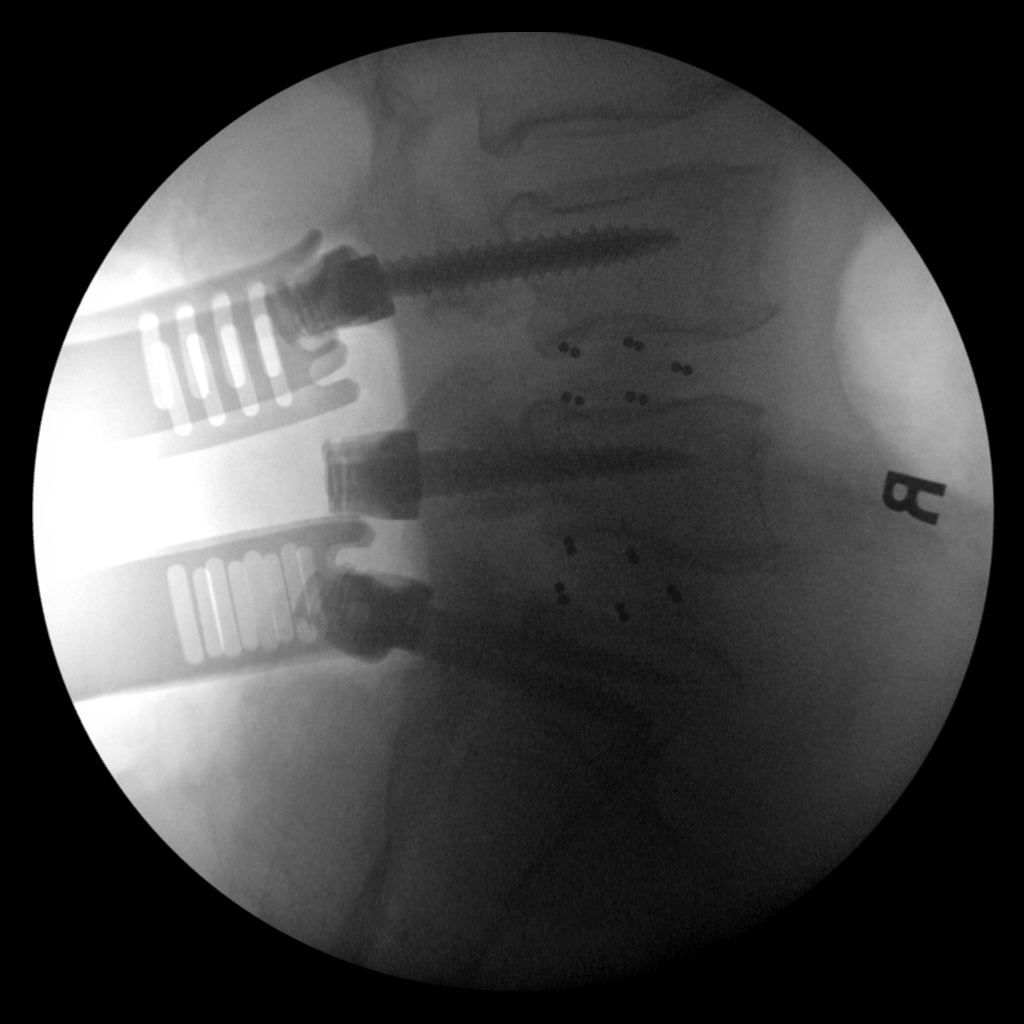
[im 2/3]
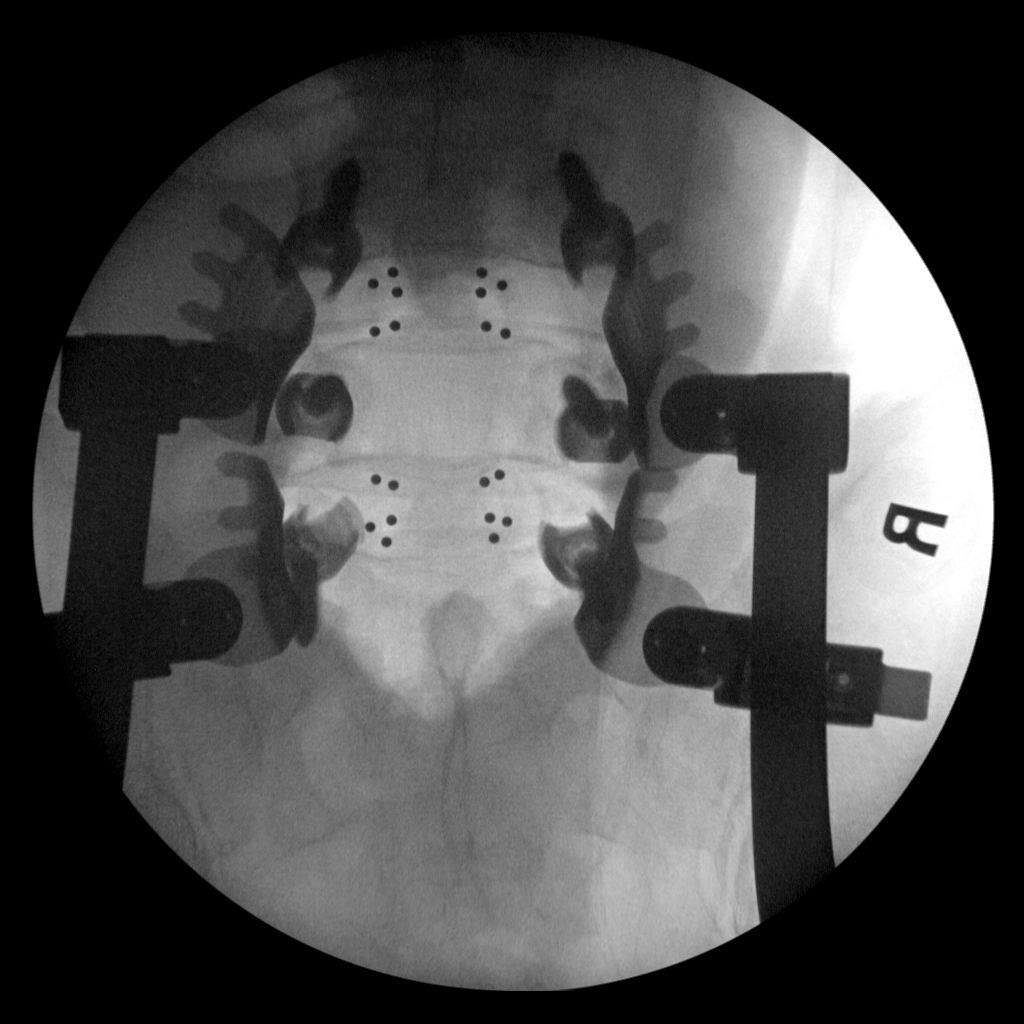
[im 3/3]
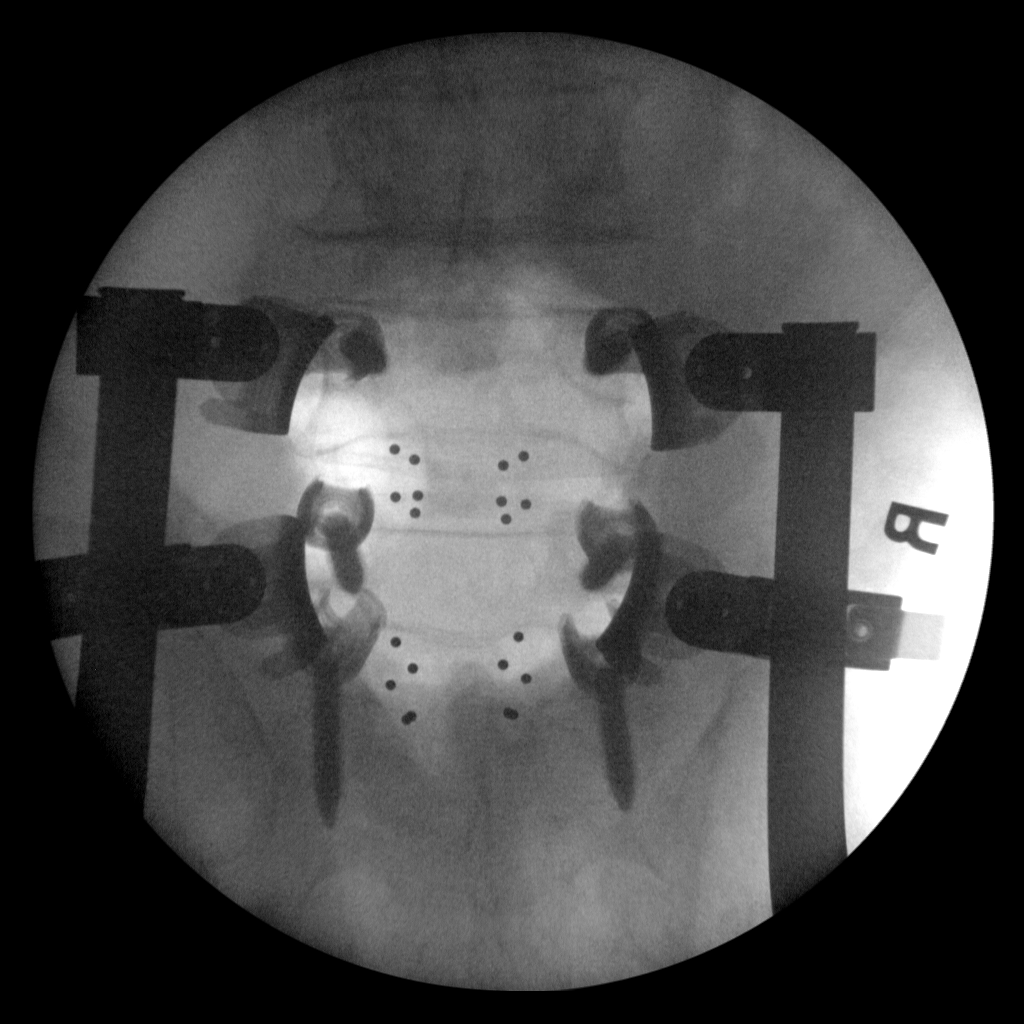

[3 of 3 positions shown; findings below may reference images not displayed]

FINDINGS: Fluoro time: 40 seconds.

Radiation: 31.35 mGy.

Three C-arm fluoroscopic images were obtained intraoperatively and
submitted for post operative interpretation. These images
demonstrate bilateral pedicle screw placement at L4, L5, and S1 with
interbody spacers at L4-L5 and L5-S1. Please see the performing
provider's procedural report for further detail.
IMPRESSION: Intraoperative fluoroscopic images, as detailed above.

## 2020-12-03 IMAGING — RF DG C-ARM 1-60 MIN
1 series · 3 of 3 positions shown · non-contrast
Comparison: MRI of the lumbar spine [DATE].

CLINICAL DATA: ELECTIVE SURGERY.  Posterior interbody fusion.

EXAM:
LUMBAR SPINE - 2-3 VIEW; DG C-ARM 1-60 MIN

[Series 1: run · 3 of 3 slices shown]
[im 1/3]
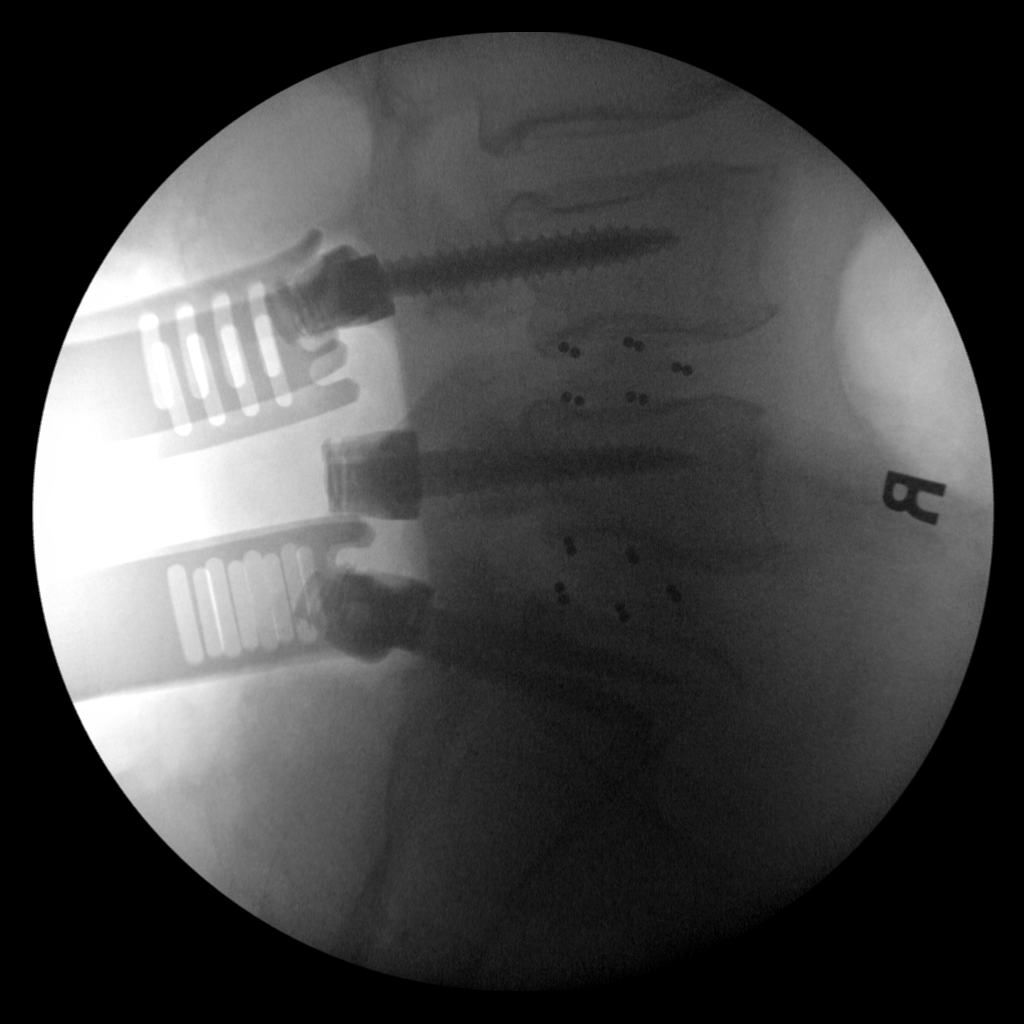
[im 2/3]
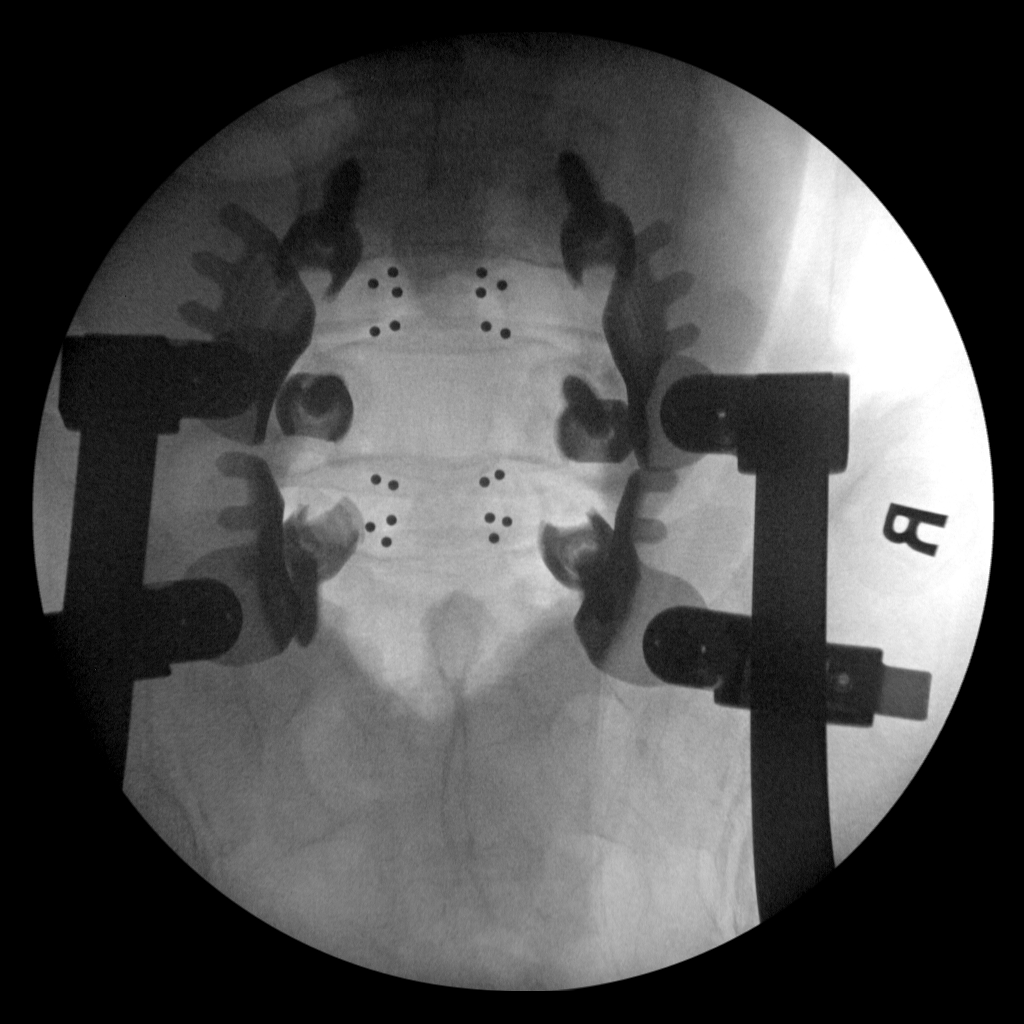
[im 3/3]
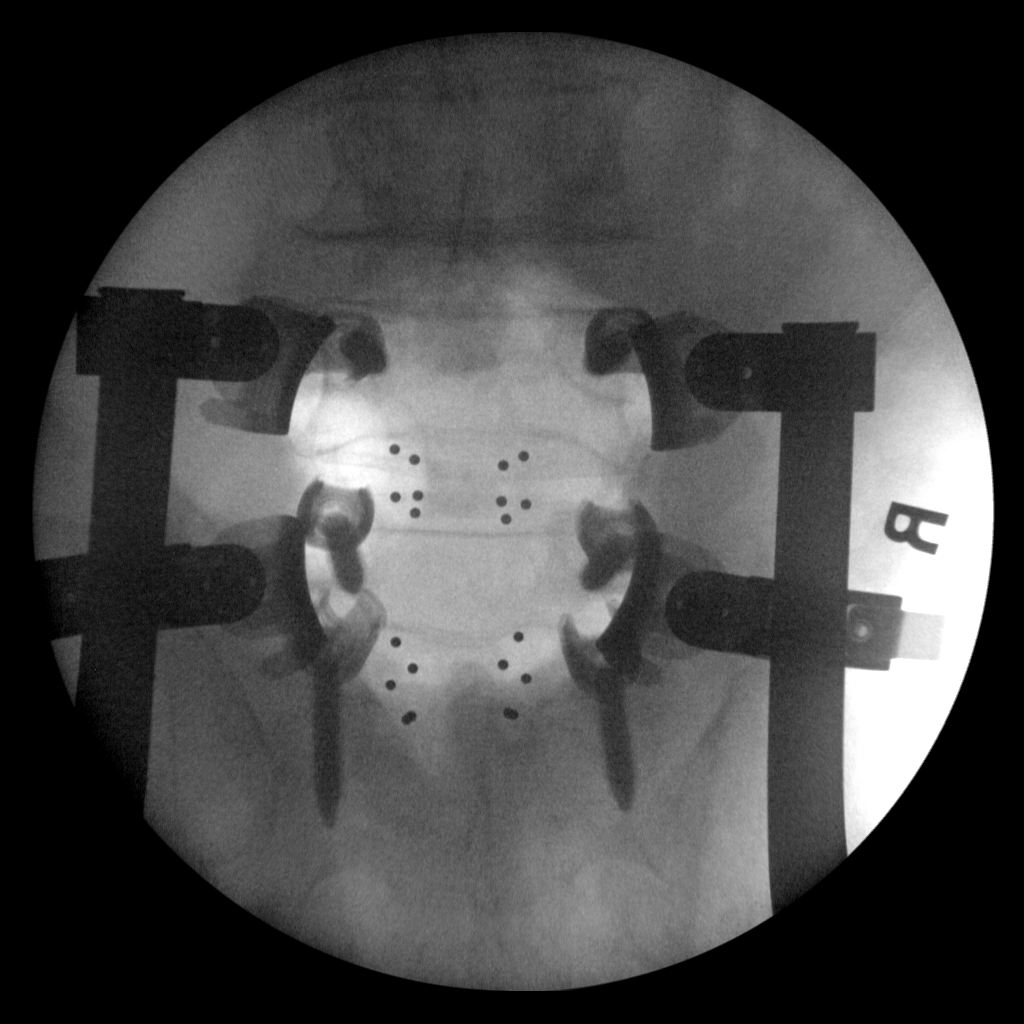

[3 of 3 positions shown; findings below may reference images not displayed]

FINDINGS: Fluoro time: 40 seconds.

Radiation: 31.35 mGy.

Three C-arm fluoroscopic images were obtained intraoperatively and
submitted for post operative interpretation. These images
demonstrate bilateral pedicle screw placement at L4, L5, and S1 with
interbody spacers at L4-L5 and L5-S1. Please see the performing
provider's procedural report for further detail.
IMPRESSION: Intraoperative fluoroscopic images, as detailed above.

## 2020-12-03 SURGERY — POSTERIOR LUMBAR FUSION 2 LEVEL
Anesthesia: General | Site: Back

## 2020-12-03 MED ORDER — ACETAMINOPHEN 650 MG RE SUPP: 650.0000 mg | RECTAL | Status: DC | PRN

## 2020-12-03 MED ORDER — ASPIRIN EC 81 MG PO TBEC
81.0000 mg | DELAYED_RELEASE_TABLET | Freq: Every day | ORAL | Status: DC
  Administered 2020-12-04: 81 mg via ORAL
  Filled 2020-12-03: qty 1

## 2020-12-03 MED ORDER — ONDANSETRON HCL 4 MG/2ML IJ SOLN
INTRAMUSCULAR | Status: AC
  Filled 2020-12-03: qty 2

## 2020-12-03 MED ORDER — CHLORHEXIDINE GLUCONATE 0.12 % MT SOLN
15.0000 mL | Freq: Once | OROMUCOSAL | Status: AC
  Administered 2020-12-03: 15 mL via OROMUCOSAL
  Filled 2020-12-03: qty 15

## 2020-12-03 MED ORDER — BUPROPION HCL ER (SR) 150 MG PO TB12
150.0000 mg | ORAL_TABLET | Freq: Two times a day (BID) | ORAL | Status: DC
  Administered 2020-12-03 – 2020-12-04 (×2): 150 mg via ORAL
  Filled 2020-12-03 (×3): qty 1

## 2020-12-03 MED ORDER — FENTANYL CITRATE (PF) 250 MCG/5ML IJ SOLN
INTRAMUSCULAR | Status: DC | PRN
  Administered 2020-12-03 (×3): 50 ug via INTRAVENOUS
  Administered 2020-12-03: 100 ug via INTRAVENOUS

## 2020-12-03 MED ORDER — ACETAMINOPHEN 10 MG/ML IV SOLN
INTRAVENOUS | Status: DC | PRN
  Administered 2020-12-03: 1000 mg via INTRAVENOUS

## 2020-12-03 MED ORDER — BUPIVACAINE LIPOSOME 1.3 % IJ SUSP
20.0000 mL | INTRAMUSCULAR | Status: AC
  Administered 2020-12-03: 20 mL
  Filled 2020-12-03: qty 20

## 2020-12-03 MED ORDER — FENOFIBRATE 160 MG PO TABS
160.0000 mg | ORAL_TABLET | Freq: Every day | ORAL | Status: DC
  Administered 2020-12-04: 160 mg via ORAL
  Filled 2020-12-03: qty 1

## 2020-12-03 MED ORDER — SODIUM CHLORIDE 0.9 % IV SOLN: 250.0000 mL | INTRAVENOUS | Status: DC

## 2020-12-03 MED ORDER — ROCURONIUM BROMIDE 10 MG/ML (PF) SYRINGE
PREFILLED_SYRINGE | INTRAVENOUS | Status: DC | PRN
  Administered 2020-12-03: 100 mg via INTRAVENOUS
  Administered 2020-12-03 (×4): 20 mg via INTRAVENOUS
  Administered 2020-12-03: 10 mg via INTRAVENOUS

## 2020-12-03 MED ORDER — DEXAMETHASONE SODIUM PHOSPHATE 10 MG/ML IJ SOLN
INTRAMUSCULAR | Status: DC | PRN
  Administered 2020-12-03: 10 mg via INTRAVENOUS

## 2020-12-03 MED ORDER — CELECOXIB 200 MG PO CAPS: 200.0000 mg | ORAL_CAPSULE | Freq: Every day | ORAL | Status: DC

## 2020-12-03 MED ORDER — METHOCARBAMOL 1000 MG/10ML IJ SOLN
500.0000 mg | Freq: Four times a day (QID) | INTRAVENOUS | Status: DC | PRN
  Filled 2020-12-03: qty 5

## 2020-12-03 MED ORDER — METHOCARBAMOL 500 MG PO TABS
ORAL_TABLET | ORAL | Status: AC
  Filled 2020-12-03: qty 1

## 2020-12-03 MED ORDER — MENTHOL 3 MG MT LOZG: 1.0000 | LOZENGE | OROMUCOSAL | Status: DC | PRN

## 2020-12-03 MED ORDER — SODIUM CHLORIDE 0.9% FLUSH
3.0000 mL | Freq: Two times a day (BID) | INTRAVENOUS | Status: DC
  Administered 2020-12-03: 3 mL via INTRAVENOUS

## 2020-12-03 MED ORDER — INSULIN ASPART 100 UNIT/ML ~~LOC~~ SOLN
4.0000 [IU] | Freq: Three times a day (TID) | SUBCUTANEOUS | Status: DC
  Administered 2020-12-04 (×2): 4 [IU] via SUBCUTANEOUS

## 2020-12-03 MED ORDER — VANCOMYCIN HCL IN DEXTROSE 1-5 GM/200ML-% IV SOLN
1000.0000 mg | Freq: Once | INTRAVENOUS | Status: AC
  Administered 2020-12-04: 1000 mg via INTRAVENOUS
  Filled 2020-12-03: qty 200

## 2020-12-03 MED ORDER — LIDOCAINE 2% (20 MG/ML) 5 ML SYRINGE
INTRAMUSCULAR | Status: AC
  Filled 2020-12-03: qty 5

## 2020-12-03 MED ORDER — KRILL OIL 500 MG PO CAPS: 500.0000 mg | ORAL_CAPSULE | Freq: Every day | ORAL | Status: DC

## 2020-12-03 MED ORDER — ACETAMINOPHEN 325 MG PO TABS: 650.0000 mg | ORAL_TABLET | ORAL | Status: DC | PRN

## 2020-12-03 MED ORDER — SALINE NASAL SPRAY 0.65 % NA SOLN: 1.0000 | Freq: Every day | NASAL | Status: DC | PRN

## 2020-12-03 MED ORDER — LACTATED RINGERS IV SOLN: INTRAVENOUS | Status: DC

## 2020-12-03 MED ORDER — HYDROMORPHONE HCL 1 MG/ML IJ SOLN
0.5000 mg | INTRAMUSCULAR | Status: DC | PRN
Start: 2020-12-03 — End: 2020-12-04

## 2020-12-03 MED ORDER — 0.9 % SODIUM CHLORIDE (POUR BTL) OPTIME
TOPICAL | Status: DC | PRN
  Administered 2020-12-03: 1000 mL

## 2020-12-03 MED ORDER — FLEET ENEMA 7-19 GM/118ML RE ENEM: 1.0000 | ENEMA | Freq: Once | RECTAL | Status: DC | PRN

## 2020-12-03 MED ORDER — AZELASTINE HCL 0.1 % NA SOLN: 2.0000 | Freq: Two times a day (BID) | NASAL | Status: DC

## 2020-12-03 MED ORDER — LIDOCAINE-EPINEPHRINE 1 %-1:100000 IJ SOLN
INTRAMUSCULAR | Status: DC | PRN
  Administered 2020-12-03: 5 mL

## 2020-12-03 MED ORDER — PROPOFOL 10 MG/ML IV BOLUS
INTRAVENOUS | Status: AC
  Filled 2020-12-03: qty 20

## 2020-12-03 MED ORDER — EPHEDRINE SULFATE 50 MG/ML IJ SOLN
INTRAMUSCULAR | Status: DC | PRN
  Administered 2020-12-03: 5 mg via INTRAVENOUS

## 2020-12-03 MED ORDER — HYDROCODONE-ACETAMINOPHEN 5-325 MG PO TABS
2.0000 | ORAL_TABLET | ORAL | Status: DC | PRN
  Administered 2020-12-03: 2 via ORAL
  Filled 2020-12-03: qty 2

## 2020-12-03 MED ORDER — THROMBIN 5000 UNITS EX SOLR
OROMUCOSAL | Status: DC | PRN
  Administered 2020-12-03 (×2): 5 mL via TOPICAL

## 2020-12-03 MED ORDER — MIDAZOLAM HCL 2 MG/2ML IJ SOLN
INTRAMUSCULAR | Status: DC | PRN
  Administered 2020-12-03: 2 mg via INTRAVENOUS

## 2020-12-03 MED ORDER — ACETAMINOPHEN 500 MG PO TABS
1000.0000 mg | ORAL_TABLET | Freq: Four times a day (QID) | ORAL | Status: DC
  Administered 2020-12-03 – 2020-12-04 (×3): 1000 mg via ORAL
  Filled 2020-12-03 (×5): qty 2

## 2020-12-03 MED ORDER — ORAL CARE MOUTH RINSE: 15.0000 mL | Freq: Once | OROMUCOSAL | Status: AC

## 2020-12-03 MED ORDER — THROMBIN 5000 UNITS EX SOLR
CUTANEOUS | Status: AC
  Filled 2020-12-03: qty 5000

## 2020-12-03 MED ORDER — VITAMIN B-12 1000 MCG PO TABS
2000.0000 ug | ORAL_TABLET | Freq: Every day | ORAL | Status: DC
  Administered 2020-12-04: 2000 ug via ORAL
  Filled 2020-12-03: qty 2

## 2020-12-03 MED ORDER — BUPIVACAINE HCL (PF) 0.5 % IJ SOLN
INTRAMUSCULAR | Status: DC | PRN
  Administered 2020-12-03: 5 mL

## 2020-12-03 MED ORDER — ONDANSETRON HCL 4 MG/2ML IJ SOLN: 4.0000 mg | Freq: Four times a day (QID) | INTRAMUSCULAR | Status: DC | PRN

## 2020-12-03 MED ORDER — ALUM & MAG HYDROXIDE-SIMETH 200-200-20 MG/5ML PO SUSP: 30.0000 mL | Freq: Four times a day (QID) | ORAL | Status: DC | PRN

## 2020-12-03 MED ORDER — ZOLPIDEM TARTRATE 5 MG PO TABS: 5.0000 mg | ORAL_TABLET | Freq: Every evening | ORAL | Status: DC | PRN

## 2020-12-03 MED ORDER — INSULIN ASPART 100 UNIT/ML ~~LOC~~ SOLN
0.0000 [IU] | Freq: Three times a day (TID) | SUBCUTANEOUS | Status: DC
  Administered 2020-12-04: 2 [IU] via SUBCUTANEOUS
  Administered 2020-12-04: 3 [IU] via SUBCUTANEOUS

## 2020-12-03 MED ORDER — HYDROMORPHONE HCL 1 MG/ML IJ SOLN
0.2500 mg | INTRAMUSCULAR | Status: DC | PRN
Start: 2020-12-03 — End: 2020-12-03
  Administered 2020-12-03 (×4): 0.5 mg via INTRAVENOUS

## 2020-12-03 MED ORDER — GLUCOSE BLOOD VI STRP: 1.0000 | ORAL_STRIP | Freq: Three times a day (TID) | Status: DC

## 2020-12-03 MED ORDER — ROCURONIUM BROMIDE 10 MG/ML (PF) SYRINGE
PREFILLED_SYRINGE | INTRAVENOUS | Status: AC
  Filled 2020-12-03: qty 10

## 2020-12-03 MED ORDER — FENTANYL CITRATE (PF) 250 MCG/5ML IJ SOLN
INTRAMUSCULAR | Status: AC
  Filled 2020-12-03: qty 5

## 2020-12-03 MED ORDER — FINASTERIDE 5 MG PO TABS
5.0000 mg | ORAL_TABLET | Freq: Every day | ORAL | Status: DC
  Administered 2020-12-04: 5 mg via ORAL
  Filled 2020-12-03: qty 1

## 2020-12-03 MED ORDER — POLYETHYLENE GLYCOL 3350 17 G PO PACK: 17.0000 g | PACK | Freq: Every day | ORAL | Status: DC | PRN

## 2020-12-03 MED ORDER — CHLORHEXIDINE GLUCONATE CLOTH 2 % EX PADS: 6.0000 | MEDICATED_PAD | Freq: Once | CUTANEOUS | Status: DC

## 2020-12-03 MED ORDER — SUGAMMADEX SODIUM 200 MG/2ML IV SOLN
INTRAVENOUS | Status: DC | PRN
  Administered 2020-12-03: 200 mg via INTRAVENOUS

## 2020-12-03 MED ORDER — TAMSULOSIN HCL 0.4 MG PO CAPS
0.8000 mg | ORAL_CAPSULE | Freq: Every day | ORAL | Status: DC
  Administered 2020-12-03: 0.8 mg via ORAL
  Filled 2020-12-03: qty 2

## 2020-12-03 MED ORDER — ATORVASTATIN CALCIUM 80 MG PO TABS
80.0000 mg | ORAL_TABLET | Freq: Every day | ORAL | Status: DC
  Administered 2020-12-04: 80 mg via ORAL
  Filled 2020-12-03: qty 1

## 2020-12-03 MED ORDER — PROPOFOL 10 MG/ML IV BOLUS
INTRAVENOUS | Status: DC | PRN
  Administered 2020-12-03: 150 mg via INTRAVENOUS

## 2020-12-03 MED ORDER — SEMAGLUTIDE (1 MG/DOSE) 2 MG/1.5ML ~~LOC~~ SOPN: 1.0000 mg | PEN_INJECTOR | SUBCUTANEOUS | Status: DC

## 2020-12-03 MED ORDER — PHENYLEPHRINE HCL-NACL 10-0.9 MG/250ML-% IV SOLN
INTRAVENOUS | Status: DC | PRN
Start: 2020-12-03 — End: 2020-12-03
  Administered 2020-12-03: 35 ug/min via INTRAVENOUS

## 2020-12-03 MED ORDER — OXYCODONE HCL 5 MG PO TABS
5.0000 mg | ORAL_TABLET | ORAL | Status: DC | PRN
  Administered 2020-12-03: 5 mg via ORAL

## 2020-12-03 MED ORDER — KCL IN DEXTROSE-NACL 20-5-0.45 MEQ/L-%-% IV SOLN
INTRAVENOUS | Status: DC
  Filled 2020-12-03: qty 1000

## 2020-12-03 MED ORDER — EPHEDRINE 5 MG/ML INJ
INTRAVENOUS | Status: AC
  Filled 2020-12-03: qty 10

## 2020-12-03 MED ORDER — THROMBIN 20000 UNITS EX SOLR
CUTANEOUS | Status: AC
  Filled 2020-12-03: qty 20000

## 2020-12-03 MED ORDER — SODIUM CHLORIDE 0.9% FLUSH: 3.0000 mL | INTRAVENOUS | Status: DC | PRN

## 2020-12-03 MED ORDER — PANTOPRAZOLE SODIUM 40 MG IV SOLR
40.0000 mg | Freq: Every day | INTRAVENOUS | Status: DC
  Administered 2020-12-03: 40 mg via INTRAVENOUS
  Filled 2020-12-03: qty 40

## 2020-12-03 MED ORDER — METHOCARBAMOL 500 MG PO TABS
500.0000 mg | ORAL_TABLET | Freq: Four times a day (QID) | ORAL | Status: DC | PRN
  Administered 2020-12-03: 500 mg via ORAL
  Filled 2020-12-03 (×2): qty 1

## 2020-12-03 MED ORDER — LIDOCAINE-EPINEPHRINE 1 %-1:100000 IJ SOLN
INTRAMUSCULAR | Status: AC
  Filled 2020-12-03: qty 1

## 2020-12-03 MED ORDER — ACETAMINOPHEN 10 MG/ML IV SOLN: 1000.0000 mg | Freq: Once | INTRAVENOUS | Status: DC | PRN

## 2020-12-03 MED ORDER — MIDAZOLAM HCL 2 MG/2ML IJ SOLN
INTRAMUSCULAR | Status: AC
  Filled 2020-12-03: qty 2

## 2020-12-03 MED ORDER — THROMBIN 20000 UNITS EX SOLR
CUTANEOUS | Status: DC | PRN
  Administered 2020-12-03: 20 mL via TOPICAL

## 2020-12-03 MED ORDER — FENOFIBRIC ACID 135 MG PO CPDR: 135.0000 mg | DELAYED_RELEASE_CAPSULE | Freq: Every day | ORAL | Status: DC

## 2020-12-03 MED ORDER — ONDANSETRON HCL 4 MG PO TABS: 4.0000 mg | ORAL_TABLET | Freq: Four times a day (QID) | ORAL | Status: DC | PRN

## 2020-12-03 MED ORDER — OXYCODONE HCL 5 MG PO TABS
ORAL_TABLET | ORAL | Status: AC
  Filled 2020-12-03: qty 1

## 2020-12-03 MED ORDER — LIDOCAINE 2% (20 MG/ML) 5 ML SYRINGE
INTRAMUSCULAR | Status: DC | PRN
  Administered 2020-12-03: 60 mg via INTRAVENOUS

## 2020-12-03 MED ORDER — VITAMIN D (ERGOCALCIFEROL) 1.25 MG (50000 UNIT) PO CAPS: 50000.0000 [IU] | ORAL_CAPSULE | ORAL | Status: DC

## 2020-12-03 MED ORDER — HYDROMORPHONE HCL 1 MG/ML IJ SOLN
INTRAMUSCULAR | Status: AC
  Filled 2020-12-03: qty 1

## 2020-12-03 MED ORDER — GABAPENTIN 300 MG PO CAPS
900.0000 mg | ORAL_CAPSULE | Freq: Every day | ORAL | Status: DC
  Administered 2020-12-03: 900 mg via ORAL
  Filled 2020-12-03: qty 3

## 2020-12-03 MED ORDER — PHENYLEPHRINE 40 MCG/ML (10ML) SYRINGE FOR IV PUSH (FOR BLOOD PRESSURE SUPPORT)
PREFILLED_SYRINGE | INTRAVENOUS | Status: DC | PRN
  Administered 2020-12-03 (×2): 80 ug via INTRAVENOUS
  Administered 2020-12-03 (×2): 120 ug via INTRAVENOUS

## 2020-12-03 MED ORDER — BISACODYL 10 MG RE SUPP: 10.0000 mg | Freq: Every day | RECTAL | Status: DC | PRN

## 2020-12-03 MED ORDER — PHENOL 1.4 % MT LIQD: 1.0000 | OROMUCOSAL | Status: DC | PRN

## 2020-12-03 MED ORDER — FLUTICASONE PROPIONATE 50 MCG/ACT NA SUSP: 2.0000 | Freq: Every day | NASAL | Status: DC | PRN

## 2020-12-03 MED ORDER — KETOROLAC TROMETHAMINE 15 MG/ML IJ SOLN
7.5000 mg | Freq: Four times a day (QID) | INTRAMUSCULAR | Status: AC
  Administered 2020-12-03 – 2020-12-04 (×4): 7.5 mg via INTRAVENOUS
  Filled 2020-12-03 (×4): qty 1

## 2020-12-03 MED ORDER — BUPIVACAINE HCL (PF) 0.5 % IJ SOLN
INTRAMUSCULAR | Status: AC
  Filled 2020-12-03: qty 30

## 2020-12-03 MED ORDER — INSULIN ASPART 100 UNIT/ML ~~LOC~~ SOLN: 0.0000 [IU] | Freq: Every day | SUBCUTANEOUS | Status: DC

## 2020-12-03 MED ORDER — METFORMIN HCL 500 MG PO TABS
1000.0000 mg | ORAL_TABLET | Freq: Two times a day (BID) | ORAL | Status: DC
  Administered 2020-12-04: 1000 mg via ORAL
  Filled 2020-12-03: qty 2

## 2020-12-03 MED ORDER — INSULIN DETEMIR 100 UNIT/ML ~~LOC~~ SOLN
56.0000 [IU] | Freq: Every evening | SUBCUTANEOUS | Status: DC
  Filled 2020-12-03 (×2): qty 0.56

## 2020-12-03 MED ORDER — PHENYLEPHRINE HCL (PRESSORS) 10 MG/ML IV SOLN
INTRAVENOUS | Status: AC
  Filled 2020-12-03: qty 1

## 2020-12-03 MED ORDER — ESCITALOPRAM OXALATE 10 MG PO TABS
20.0000 mg | ORAL_TABLET | Freq: Every day | ORAL | Status: DC
  Administered 2020-12-03 – 2020-12-04 (×2): 20 mg via ORAL
  Filled 2020-12-03 (×2): qty 2

## 2020-12-03 MED ORDER — ONDANSETRON HCL 4 MG/2ML IJ SOLN
INTRAMUSCULAR | Status: DC | PRN
  Administered 2020-12-03: 4 mg via INTRAVENOUS

## 2020-12-03 MED ORDER — DEXAMETHASONE SODIUM PHOSPHATE 10 MG/ML IJ SOLN
INTRAMUSCULAR | Status: AC
  Filled 2020-12-03: qty 1

## 2020-12-03 MED ORDER — DOCUSATE SODIUM 100 MG PO CAPS
100.0000 mg | ORAL_CAPSULE | Freq: Two times a day (BID) | ORAL | Status: DC
  Administered 2020-12-03 – 2020-12-04 (×2): 100 mg via ORAL
  Filled 2020-12-03 (×2): qty 1

## 2020-12-03 SURGICAL SUPPLY — 76 items
BASKET BONE COLLECTION (BASKET) ×2 IMPLANT
BENZOIN TINCTURE PRP APPL 2/3 (GAUZE/BANDAGES/DRESSINGS) ×2 IMPLANT
BLADE CLIPPER SURG (BLADE) IMPLANT
BUR MATCHSTICK NEURO 3.0 LAGG (BURR) ×2 IMPLANT
BUR PRECISION FLUTE 5.0 (BURR) ×2 IMPLANT
CAGE COROENT LG 10X9X23-12 (Cage) ×4 IMPLANT
CAGE COROENT PLIF 10X28-8 LUMB (Cage) ×4 IMPLANT
CANISTER SUCT 3000ML PPV (MISCELLANEOUS) ×2 IMPLANT
CARTRIDGE OIL MAESTRO DRILL (MISCELLANEOUS) ×1 IMPLANT
CLSR STERI-STRIP ANTIMIC 1/2X4 (GAUZE/BANDAGES/DRESSINGS) ×2 IMPLANT
CNTNR URN SCR LID CUP LEK RST (MISCELLANEOUS) ×1 IMPLANT
CONT SPEC 4OZ STRL OR WHT (MISCELLANEOUS) ×1
COVER BACK TABLE 60X90IN (DRAPES) ×2 IMPLANT
COVER WAND RF STERILE (DRAPES) ×2 IMPLANT
DECANTER SPIKE VIAL GLASS SM (MISCELLANEOUS) ×2 IMPLANT
DERMABOND ADVANCED (GAUZE/BANDAGES/DRESSINGS) ×1
DERMABOND ADVANCED .7 DNX12 (GAUZE/BANDAGES/DRESSINGS) ×1 IMPLANT
DIFFUSER DRILL AIR PNEUMATIC (MISCELLANEOUS) ×2 IMPLANT
DRAPE C-ARM 42X72 X-RAY (DRAPES) ×2 IMPLANT
DRAPE C-ARMOR (DRAPES) ×2 IMPLANT
DRAPE LAPAROTOMY 100X72X124 (DRAPES) ×2 IMPLANT
DRAPE SURG 17X23 STRL (DRAPES) ×2 IMPLANT
DRSG OPSITE POSTOP 4X8 (GAUZE/BANDAGES/DRESSINGS) ×2 IMPLANT
DURAPREP 26ML APPLICATOR (WOUND CARE) ×2 IMPLANT
ELECT REM PT RETURN 9FT ADLT (ELECTROSURGICAL) ×2
ELECTRODE REM PT RTRN 9FT ADLT (ELECTROSURGICAL) ×1 IMPLANT
EVACUATOR 1/8 PVC DRAIN (DRAIN) IMPLANT
GAUZE 4X4 16PLY RFD (DISPOSABLE) IMPLANT
GAUZE SPONGE 4X4 12PLY STRL (GAUZE/BANDAGES/DRESSINGS) IMPLANT
GLOVE BIO SURGEON STRL SZ8 (GLOVE) ×6 IMPLANT
GLOVE BIOGEL PI IND STRL 7.5 (GLOVE) ×2 IMPLANT
GLOVE BIOGEL PI IND STRL 8 (GLOVE) ×2 IMPLANT
GLOVE BIOGEL PI IND STRL 8.5 (GLOVE) ×3 IMPLANT
GLOVE BIOGEL PI INDICATOR 7.5 (GLOVE) ×2
GLOVE BIOGEL PI INDICATOR 8 (GLOVE) ×2
GLOVE BIOGEL PI INDICATOR 8.5 (GLOVE) ×3
GLOVE ECLIPSE 7.0 STRL STRAW (GLOVE) ×2 IMPLANT
GLOVE ECLIPSE 8.0 STRL XLNG CF (GLOVE) ×4 IMPLANT
GLOVE EXAM NITRILE XL STR (GLOVE) IMPLANT
GLOVE SURG SS PI 7.0 STRL IVOR (GLOVE) ×12 IMPLANT
GOWN STRL REUS W/ TWL LRG LVL3 (GOWN DISPOSABLE) IMPLANT
GOWN STRL REUS W/ TWL XL LVL3 (GOWN DISPOSABLE) ×5 IMPLANT
GOWN STRL REUS W/TWL 2XL LVL3 (GOWN DISPOSABLE) ×4 IMPLANT
GOWN STRL REUS W/TWL LRG LVL3 (GOWN DISPOSABLE)
GOWN STRL REUS W/TWL XL LVL3 (GOWN DISPOSABLE) ×5
HEMOSTAT POWDER KIT SURGIFOAM (HEMOSTASIS) ×4 IMPLANT
KIT BASIN OR (CUSTOM PROCEDURE TRAY) ×2 IMPLANT
KIT INFUSE X SMALL 1.4CC (Orthopedic Implant) ×2 IMPLANT
KIT POSITION SURG JACKSON T1 (MISCELLANEOUS) ×2 IMPLANT
KIT TURNOVER KIT B (KITS) ×2 IMPLANT
MILL MEDIUM DISP (BLADE) ×2 IMPLANT
NEEDLE HYPO 25X1 1.5 SAFETY (NEEDLE) ×2 IMPLANT
NEEDLE SPNL 18GX3.5 QUINCKE PK (NEEDLE) ×2 IMPLANT
NS IRRIG 1000ML POUR BTL (IV SOLUTION) ×2 IMPLANT
OIL CARTRIDGE MAESTRO DRILL (MISCELLANEOUS) ×2
PACK LAMINECTOMY NEURO (CUSTOM PROCEDURE TRAY) ×2 IMPLANT
PAD ARMBOARD 7.5X6 YLW CONV (MISCELLANEOUS) ×6 IMPLANT
PATTIES SURGICAL .5 X.5 (GAUZE/BANDAGES/DRESSINGS) IMPLANT
PATTIES SURGICAL .5 X1 (DISPOSABLE) IMPLANT
PATTIES SURGICAL 1X1 (DISPOSABLE) IMPLANT
ROD RELINE TI LORD 5.5X70 (Rod) ×4 IMPLANT
SCREW LOCK RELINE 5.5 TULIP (Screw) ×12 IMPLANT
SCREW RELINE-O POLY 6.5X45 (Screw) ×8 IMPLANT
SCREW RELINE-O POLY 6.5X50MM (Screw) ×4 IMPLANT
SPONGE LAP 4X18 RFD (DISPOSABLE) IMPLANT
SPONGE SURGIFOAM ABS GEL 100 (HEMOSTASIS) ×2 IMPLANT
STAPLER SKIN PROX WIDE 3.9 (STAPLE) IMPLANT
SUT VIC AB 1 CT1 18XBRD ANBCTR (SUTURE) ×2 IMPLANT
SUT VIC AB 1 CT1 8-18 (SUTURE) ×2
SUT VIC AB 2-0 CT1 18 (SUTURE) ×4 IMPLANT
SUT VIC AB 3-0 SH 8-18 (SUTURE) ×4 IMPLANT
SYR 5ML LL (SYRINGE) IMPLANT
TOWEL GREEN STERILE (TOWEL DISPOSABLE) ×2 IMPLANT
TOWEL GREEN STERILE FF (TOWEL DISPOSABLE) ×2 IMPLANT
TRAY FOLEY MTR SLVR 16FR STAT (SET/KITS/TRAYS/PACK) ×2 IMPLANT
WATER STERILE IRR 1000ML POUR (IV SOLUTION) ×2 IMPLANT

## 2020-12-03 NOTE — Progress Notes (Signed)
Patient arrived to 3w-34

## 2020-12-03 NOTE — Anesthesia Procedure Notes (Signed)
Procedure Name: Intubation Date/Time: 12/03/2020 1:23 PM Performed by: Inda Coke, CRNA Pre-anesthesia Checklist: Patient identified, Emergency Drugs available, Suction available and Patient being monitored Patient Re-evaluated:Patient Re-evaluated prior to induction Oxygen Delivery Method: Circle System Utilized Preoxygenation: Pre-oxygenation with 100% oxygen Induction Type: IV induction Ventilation: Mask ventilation without difficulty and Oral airway inserted - appropriate to patient size Laryngoscope Size: Mac and 4 Grade View: Grade I Tube type: Oral Tube size: 7.5 mm Number of attempts: 1 Airway Equipment and Method: Stylet and Oral airway Placement Confirmation: ETT inserted through vocal cords under direct vision,  positive ETCO2 and breath sounds checked- equal and bilateral Secured at: 22 cm Tube secured with: Tape Dental Injury: Teeth and Oropharynx as per pre-operative assessment

## 2020-12-03 NOTE — Op Note (Signed)
12/03/2020  5:33 PM  PATIENT:  [name]  70 y.o. male  PRE-OPERATIVE DIAGNOSIS:  Degenerative Lumbar spinal stenosis, Recurrent disc herniation, Chronic bilateral low back pain with bilateral sciatica, lumbar spondylolisthesis, lumbago, radiculopathy L 45, L 5 S 1 levels  POST-OPERATIVE DIAGNOSIS:   Degenerative Lumbar spinal stenosis, Recurrent disc herniation, Chronic bilateral low back pain with bilateral sciatica, lumbar spondylolisthesis, lumbago, radiculopathy L 45, L 5 S 1 levels  PROCEDURE:  Procedure(s): Lumbar four-five Lumbar five Sacral one Posterior lumbar interbody fusion (N/A) redo decompression with PEEK interbody cages and local autograft, pedicle screw fixation and posterolateral arthrodesis  SURGEON:  Surgeon(s) and Role:    Erline Levine, MD - Primary    Kristeen Miss, MD - Assisting  PHYSICIAN ASSISTANT: Glenford Peers, NP  ASSISTANTS: Poteat, RN   ANESTHESIA:   general  EBL:  150 mL   BLOOD ADMINISTERED:none  DRAINS: none   LOCAL MEDICATIONS USED:  MARCAINE    and LIDOCAINE   SPECIMEN:  No Specimen  DISPOSITION OF SPECIMEN:  N/A  COUNTS:  YES  TOURNIQUET:  * No tourniquets in log *  DICTATION: Patient is 70 year old man with  HNP, spondylosis, stenosis, DDD, radiculopathy L4/5, L5/S1. He underwent prior laminectomy at L 45 but now has recurrent disc herniation and severe central and bi-foraminal stenosis. It was elected to take him to surgery for redo decompression and fusion at L4/5 and L5/S1 levels.    Procedure: Patient was placed in a prone position on the Florence table after smooth and uncomplicated induction of general endotracheal anesthesia. His low back was prepped and draped in usual sterile fashion with betadine scrub and DuraPrep after relevant bony anatomy was localized with the C arm. Area of incision was infiltrated with local lidocaine. Incision was made to the lumbodorsal fascia was incised and exposure was performed of the L4/5,  L5/S1 spinous processes laminae facet joint and transverse processes. Intraoperative x-ray was obtained which confirmed correct orientation. A total laminectomy of L4 and L5 was performed with disarticulation of the facet joints at this level and thorough decompression was performed of both L4, L5 and S1 nerve roots along with the common dural tube. There was densely adherent spondylytic material compressing the thecal sac and both L4 ,  L5 and S 1 nerve roots.  Decompression was greater than would be typically performed for simple interbody fusion and involved redo decompression through previous laminectomy site. A thorough discectomy and preparation of the endplates was performed at both the L4/5 and L5/S1 levels.  The interspaces were packed with extra small BMP,autograft and PEEK cages (10 mm at L5/S1 and 10 mm at L4/5) . At L 45, 10 x 9 x 23 mm x 8 degree cages were placed with 10 cc autograft medial to the second cage.    At L 5 S 1 , 10 x 9 x 23 mm x 12 degree cages were placed with 10 cc autograft medial to the second cage. The posterolateral region was extensively decorticated and pedicle probes were placed at L4, L5 and S1 bilaterally. Intraoperative fluoroscopy confirmed correct orientationin the AP and lateral plane. 45 x 6.5 mm pedicle screws were placed at S1 bilaterally and 50 x 6.5 mm screws placed at L5 bilaterally and 50 x 6.2mm screws were placed at L4 bilaterally.  Final x-rays demonstrated well-positioned interbody grafts and pedicle screw fixation. 70 mm lordotic rods were placed and locked down in situ and the posterolateral region was packed with the remaining 20 cc  bone autograft on the left. Long-acting Marcaine was injected in the deep musculature.   Fascia was closed with 1 Vicryl sutures skin edges were reapproximated 2 and 3-0 Vicryl sutures. The wound is dressed with Dermabond and an occlusive dressing. The patient was extubated in the operating room and taken to recovery in stable  satisfactory condition having tolerated the operation well. Counts were correct at the end of the case.   PLAN OF CARE: Admit to inpatient   PATIENT DISPOSITION:  PACU - hemodynamically stable.   Delay start of Pharmacological VTE agent (>24hrs) due to surgical blood loss or risk of bleeding: yes

## 2020-12-03 NOTE — H&P (Signed)
Patient ID:   [id number] Patient: [name] [name]  Date of Birth: [birth year] Visit Type: Office Visit   Date: 10/23/2020 11:15 AM Provider: Marchia Meiers. Vertell Limber MD   This 70 year old male presents for back pain.  HISTORY OF PRESENT ILLNESS: 1.  back pain  The patient returns to review his imaging and a surgical plan.  While he has degenerative changes involving the L2-3 and L3-4 levels, these are not nearly as marked disease L4-5 and L5-S1 levels.  He has severe spinal stenosis at the L4-5 level and severe bilateral foraminal stenosis the L5-S1 level which is affecting the L5 nerve roots.  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  This does not appear to have changed is significant degree compared to previous imaging.  He describes his pain level as 7/10 in severity.  Based on my review of his imaging, I have recommended proceeding with L4-5 and L5-S1 decompression and fusion surgery.  I do not recommend surgery at the L2-3 and L3-4 levels.  The patient knows he needs to continue to lose weight.  He is currently 236.8 lb.  He wishes to go ahead with surgery as scheduled for 12/03/2020      Medical/Surgical/Interim History Reviewed, no change.  Last detailed document date:05/17/2019.     PAST MEDICAL HISTORY, SURGICAL HISTORY, FAMILY HISTORY, SOCIAL HISTORY AND REVIEW OF SYSTEMS I have reviewed the patient's past medical, surgical, family and social history as well as the comprehensive review of systems as included on the [facility] history form dated 01/11/2020, which I have signed.  Family History: Reviewed, no changes.  Last detailed document date:05/17/2019.   Social History: Reviewed, no changes. Last detailed document date: 05/17/2019.    MEDICATIONS: (added, continued or stopped this visit) Started Medication Directions Instruction Stopped  aspirin 325 mg tablet,delayed release take 1 tablet by oral route  every  day    escitalopram 20 mg tablet take 1 tablet by oral route  every day    fenofibric acid (choline) 135 mg capsule,delayed release take 1 capsule by oral route  every day    finasteride 5 mg tablet take 1 tablet by oral route  every day    Flonase Allergy Relief 50 mcg/actuation nasal spray,suspension spray 1 - 2 spray by intranasal route  every day in each nostril as needed    Levemir FlexTouch U-100 Insulin 100 unit/mL (3 mL) subcutaneous pen inject 25 units by subcutaneous route  every day at bedtime    melatonin     metformin 500 mg tablet take 1 tablet by oral route 2 times every day with morning and evening meals   07/16/2020 Neurontin 300 mg capsule take 1 capsule by oral route 3 times every day    omega-3 fatty acids 1,000 mg capsule     tamsulosin 0.4 mg capsule take 1 capsule by oral route  every day 1/2 hour following the same meal each day    Victoza 2-Pak 0.6 mg/0.1 mL (18 mg/3 mL) subcutaneous pen injector inject (1.2MG )  by subcutaneous route  every day    Vitamin B 12  BUCCAL 1000 mg    Vitamin D2 50,000 unit capsule take 1 capsule by oral route  every week      ALLERGIES: Ingredient Reaction Medication Name Comment SIMVASTATIN    DIPHENHYDRAMINE    CODEINE    PENICILLIN    ANTIHISTAMINES - ALKYLAMINE    SULFA (SULFONAMIDE ANTIBIOTICS)     Reviewed, no changes.  PHYSICAL EXAM:  Vitals Date Temp F BP Pulse Ht In Wt Lb BMI BSA Pain Score 10/23/2020  134/77 77 70 236.8 33.98  7/10     IMPRESSION:  New imaging supports surgical recommendation of L4-5 and L5-S1 decompression fusion surgery  PLAN: Plan to proceed with decompression and fusion surgery on December 03, 2020. Patient was given prescription for lumbosacral orthosis.  The risks and benefits of surgery discussed detail with the patient and he wishes to proceed.  Patient education was  performed  Orders: Diagnostic Procedures: Assessment Procedure M54.16 Lumbar Spine- AP/Lat Instruction(s)/Education: Assessment Instruction I10 Lifestyle education [phone] Dietary management education, guidance, and counseling Miscellaneous: Assessment  M43.16 LSO Brace  Completed Orders (this encounter) Order Details Reason Side Interpretation Result Initial Treatment Date Region Lifestyle education Patient will follow up with Primary Care Physician.       Dietary management education, guidance, and counseling Encouraged patient to eat well balanced diet.        Assessment/Plan  # Detail Type Description  1. Assessment Degenerative lumbar spinal stenosis (M48.061).     2. 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     3. Assessment Radiculopathy, lumbar region (M54.16).     4. Assessment Spondylolisthesis, lumbar region (M43.16).  Plan Orders LSO Brace.     5. Assessment Essential (primary) hypertension (I10).     6. Assessment Body mass index (BMI) 33.0-33.9, adult [id number]).  Plan Orders Today's instructions / counseling include(s) Dietary management education, guidance, and counseling. Clinical information/comments: Encouraged patient to eat well balanced diet.       Pain Management Plan Pain Scale: 7/10. Method: Numeric Pain Intensity Scale. Location: back. Onset: 05/16/2018. Duration: varies. Quality: discomforting. Pain management follow-up plan of care: Patient will continue medication management..              Provider:  Marchia Meiers. Vertell Limber MD  10/27/2020 06:52 AM    Dictation edited by: Marchia Meiers. Vertell Limber    CC Providers: Shawnie Dapper [address] [place],  [place]  [address]-   Philip McGowen  [address] [place], [place] [address]-               Electronically signed by Marchia Meiers Vertell Limber MD on 10/27/2020 06:52 AM Patient ID:    [id number] Patient: [name] [name]  Date of Birth: Apr 11, 1951 Visit Type: Office Visit   Date: 09/20/2020 02:00 PM Provider: Marchia Meiers. Vertell Limber MD   This 70 year old male presents for MRI Review.  HISTORY OF PRESENT ILLNESS: 1.  MRI Review  Mr. Hollobaugh is a 70 year old-male who is well known to this office returns to the clinic today to review his recent MRI. He continues to report low back pain that radiates into his left hip and left thigh and reports his pain to be a 7/10 today.  He does also report that he has an occasional RLE radiculopathy but is not constant or as severe as his LLE. OTC medications such as naproxen have offered no relief. He takes gabapentin 300 mg X 3 tablets at bedtime for RLS. He participated in physical therapy for about 1 year and offered minimal benefit. He reported that he has continued to work on weight control. Despite receiving injections, the patient reports that he is continuing to worsen and wants to proceed with possible surgical intervention.   01/11/2020 L4, L5 interlaminar ESI by Dr. Maryjean Ka  offered 2 weeks(+) relief 07/25/2020 L4, L5 interlaminar ESI by Dr. Davy Pique offered 2 days  relief      Medical/Surgical/Interim History Reviewed, no change.  Last detailed document date:05/17/2019.     Family History: Reviewed, no changes.  Last detailed document date:05/17/2019.   Social History: Reviewed, no changes. Last detailed document date: 05/17/2019.    MEDICATIONS: (added, continued or stopped this visit) Started Medication Directions Instruction Stopped  aspirin 325 mg tablet,delayed release take 1 tablet by oral route  every day    escitalopram 20 mg tablet take 1 tablet by oral route  every day    fenofibric acid (choline) 135 mg capsule,delayed release take 1 capsule by oral route  every day    finasteride 5 mg tablet take 1 tablet by oral route  every day    Flonase Allergy Relief 50 mcg/actuation nasal  spray,suspension spray 1 - 2 spray by intranasal route  every day in each nostril as needed    Levemir FlexTouch U-100 Insulin 100 unit/mL (3 mL) subcutaneous pen inject 25 units by subcutaneous route  every day at bedtime    melatonin     metformin 500 mg tablet take 1 tablet by oral route 2 times every day with morning and evening meals   07/16/2020 Neurontin 300 mg capsule take 1 capsule by oral route 3 times every day    omega-3 fatty acids 1,000 mg capsule     tamsulosin 0.4 mg capsule take 1 capsule by oral route  every day 1/2 hour following the same meal each day    Victoza 2-Pak 0.6 mg/0.1 mL (18 mg/3 mL) subcutaneous pen injector inject (1.2MG )  by subcutaneous route  every day    Vitamin B 12  BUCCAL 1000 mg    Vitamin D2 50,000 unit capsule take 1 capsule by oral route  every week      ALLERGIES: Ingredient Reaction Medication Name Comment ANTIHISTAMINES - ALKYLAMINE    SIMVASTATIN    DIPHENHYDRAMINE    CODEINE    SULFA (SULFONAMIDE ANTIBIOTICS)    PENICILLIN     Reviewed, no changes.    PHYSICAL EXAM:  Vitals Date Temp F BP Pulse Ht In Wt Lb BMI BSA Pain Score 09/20/2020  116/66 91 70 237 34.01  7/10   PHYSICAL EXAM Details General Level of Distress: no acute distress Overall Appearance: normal    Cardiovascular Cardiac: regular rate and rhythm without murmur  Respiratory Lungs: clear to auscultation  Neurological Recent and Remote Memory: normal Attention Span and Concentration:   normal Language: normal Fund of Knowledge: normal  Right Left Sensation: normal normal Upper Extremity Coordination: normal normal  Lower Extremity Coordination: normal normal  Musculoskeletal Gait and Station: normal  Right Left Upper Extremity Muscle Strength: normal normal Lower Extremity Muscle Strength: normal normal Upper Extremity Muscle Tone:  normal normal Lower Extremity Muscle  Tone: normal normal   Motor Strength Upper and lower extremity motor strength was tested in the clinically pertinent muscles.     Deep Tendon Reflexes  Right Left Biceps: normal normal Triceps: normal normal Brachioradialis: normal normal Patellar: normal normal Achilles: normal normal  Sensory Sensation was tested at L1 to S1.   Cranial Nerves II. Optic Nerve/Visual Fields: normal III. Oculomotor: normal IV. Trochlear: normal V. Trigeminal: normal VI. Abducens: normal VII. Facial: normal VIII. Acoustic/Vestibular: normal IX. Glossopharyngeal: normal X. Vagus: normal XI. Spinal Accessory: normal XII. Hypoglossal: normal  Motor and other Tests Lhermittes: negative Rhomberg: negative    Right Left Hoffman's: normal normal Clonus: normal normal Babinski: normal normal SLR: negative positive Patrick's Corky Sox): negative negative Toe  Walk: normal normal Toe Lift: normal normal Heel Walk: normal normal SI Joint: nontender nontender     DIAGNOSTIC RESULTS:  Lumbar MRI with and without contrast reveled worsening spinal and foraminal stenosis at the L4/5 level with 2 mm of anterolisthesis. There is also a disc protrusion that is more prominent on the left. L5/S1 with bilateral facet arthropathy and bilateral foraminal stenosis. The L2/3 and L3/4 levels with a bulging disc leading to mild spinal stenosis and bilateral recess stenosis.    IMPRESSION:  The patient's imaging shows worsening anterolisthesis and spinal stenosis at the L4-5 level and severe degeneration at the L5-S1 level. He has a positive seated straight leg raise on left. Full strength on confrontational testing. He has continued to receive ESI with minimal effectiveness.  I recommend the patient undergo potential decompression and fusion of the L4-5 and L5-S1 levels.  PLAN: The patient will follow-up in the clinic to discuss potential surgical  treatments.  Orders: Instruction(s)/Education: Assessment Instruction Z68.34 Dietary management education, guidance, and counseling  Completed Orders (this encounter) Order Details Reason Side Interpretation Result Initial Treatment Date Region Dietary management education, guidance, and counseling Encouraged patient to eat well balanced diet.        Assessment/Plan  # Detail Type Description  1. Assessment Spondylolisthesis, lumbar region (M43.16).     2. Assessment Degenerative lumbar spinal stenosis (M48.061).     3. 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     4. Assessment Radiculopathy, lumbar region (M54.16).     5. Assessment Body mass index (BMI) 34.0-34.9, adult (Z68.34).  Plan Orders Today's instructions / counseling include(s) Dietary management education, guidance, and counseling. Clinical information/comments: Encouraged patient to eat well balanced diet.       Pain Management Plan Pain Scale: 7/10. Method: Numeric Pain Intensity Scale. Location: back. Onset: 05/16/2018. Duration: varies. Quality: discomforting. Pain management follow-up plan of care: Patient will continue medication management..              Provider:  Marchia Meiers. Vertell Limber MD  09/23/2020 03:57 PM    Dictation edited by: Fenton Malling, NP    CC Providers: Shawnie Dapper [address] [place],  [place]  [address]-   Philip McGowen  [address] [place], [place] [address]-        Patient ID:   [id number] Patient: Valen Landwehr  Date of Birth: 1951/06/07 Visit Type: Office Visit   Date: 05/17/2019 09:15 AM Provider: Marchia Meiers. Vertell Limber MD   This 70 year old male presents for back and bilateral hips and thigh pain.  HISTORY OF PRESENT ILLNESS: 1.  back and bilateral hips and thigh pain  Hussam Howden, 70 year old retired male, requests evaluation of low back and bilateral lower  extremity pain.  Today's visit is accomplished via tele health video conference due to the COVID-19 pandemic.  Patient notes symptoms increasing over the past year, particularly over the last 3-4 months.  He reports low back pain even supine, radiating into the hips bilaterally and right greater than left thigh when walking.  He notes some difficulty on the stairs with balance.  He is unable to be active due to the pain. Patient reports pain in the thigh and buttock after walking. Patient reports that he sometimes has thigh pain without buttock pain. Patient reports pain in his groin occasionally. Discussed weight loss. Patient reports walking down the stairs and feeling his right leg "buckle" occasionally.  Patient reports that his depression is resolved.  Aleve taken at night as needed  History: NIDDM, HTN, BPH, ED, OSA (CPAP), RLS, depression (Lexapro since suicidal ideation 1992) Surgical history:  Lumbar laminectomy 2010, appendectomy 1972, right corneal transplant, tonsillectomy age 31, vasectomy, vitrectomy  X-rays 02/13/2019 on [place]       PAST MEDICAL/SURGICAL HISTORY:   (Detailed)   Disease/disorder Onset Date Management Date Comments   Appendectomy     Colonoscopy     Corneal Transplant     Lumbar Laminectomy     Urethral Dilation     Vasectomy     Vitrectomy   Benign prostatic hypertrophy     Chronic nasal congestion     Colon polyos     DDD     Depression     Diabetes     ED     hx of pneumonia     Hypertension     Insomnia     keratoconus     Memory changes     OA     OSA on CPAP     Restless leg     Urethral stricture        Family History:  (Detailed)   Social History:  (Detailed) Tobacco use reviewed. Preferred language is [place].   Tobacco use status: Current non-smoker. Smoking status: Never smoker.  SMOKING STATUS Type Smoking Status Usage  Per Day Years Used Total Pack Years  Never smoker     TOBACCO/VAPING EXPOSURE No passive vaping exposure. No passive smoke exposure.   HOME ENVIRONMENT/SAFETY The patient is not at risk for falls. The patient has not fallen in the last year.     MEDICATIONS: (added, continued or stopped this visit) Started Medication Directions Instruction Stopped  aleve  BUCCAL     aspirin 325 mg tablet,delayed release take 1 tablet by oral route  every day    escitalopram 20 mg tablet take 1 tablet by oral route  every day    fenofibric acid (choline) 135 mg capsule,delayed release take 1 capsule by oral route  every day    finasteride 5 mg tablet take 1 tablet by oral route  every day    Flonase Allergy Relief 50 mcg/actuation nasal spray,suspension spray 1 - 2 spray by intranasal route  every day in each nostril as needed    lisinopril 5 mg tablet take 1 tablet by oral route  every day    melatonin     metformin 500 mg tablet take 1 tablet by oral route 2 times every day with morning and evening meals    naproxen sodium 220 mg capsule take 2 capsule by oral route 4 times every day   05/17/2019 Neurontin 300 mg capsule take 1 capsule by oral route 3 times every day    omega-3 fatty acids 1,000 mg capsule     Proair Digihaler 90 mcg/actuation aerosol powder breath act, sensor inhale 2 puff by inhalation route  every 4 - 6 hours as needed    Saline Nasal     Saxenda 3 mg/0.5 mL (18 mg/3 mL) subcutaneous pen injector inject (3MG )  by subcutaneous route  every day in the abdomen, thigh, or upper arm    tamsulosin 0.4 mg capsule take 1 capsule by oral route  every day 1/2 hour following the same meal each day    Vitamin B 12  BUCCAL 1000 mg      ALLERGIES: Ingredient Reaction Medication Name Comment ANTIHISTAMINES - ALKYLAMINE    SIMVASTATIN    [place]  SULFA (SULFONAMIDE  ANTIBIOTICS)    PENICILLIN       REVIEW OF SYSTEMS  See scanned patient registration form, dated, signed and dated on   Review of Systems Details System Neg/Pos Details Constitutional Negative Chills, Fatigue, Fever, Malaise, Night sweats, Weight gain and Weight loss. ENMT Negative Ear drainage, Hearing loss, Nasal drainage, Otalgia, Sinus pressure and Sore throat. Eyes Negative Eye discharge, Eye pain and Vision changes. Respiratory Negative Chronic cough, Cough, Dyspnea, Known TB exposure and Wheezing. Cardio Negative Chest pain, Claudication, Edema and Irregular heartbeat/palpitations. GI Negative Abdominal pain, Blood in stool, Change in stool pattern, Constipation, Decreased appetite, Diarrhea, Heartburn, Nausea and Vomiting. GU Negative Dribbling, Dysuria, Erectile dysfunction, Hematuria, Polyuria (Genitourinary), Slow stream, Urinary frequency, Urinary incontinence and Urinary retention. Endocrine Negative Cold intolerance, Heat intolerance, Polydipsia and Polyphagia. Neuro Positive Gait disturbance. Psych Negative Anxiety, Depression and Insomnia. Integumentary Negative Brittle hair, Brittle nails, Change in shape/size of mole(s), Hair loss, Hirsutism, Hives, Pruritus, Rash and Skin lesion. MS Positive Back pain. Hema/Lymph Negative Easy bleeding, Easy bruising and Lymphadenopathy. Allergic/Immuno Negative Contact allergy, Environmental allergies, Food allergies and Seasonal allergies. Reproductive Negative Penile discharge and Sexual dysfunction.  PHYSICAL EXAM:  Vitals Date Temp F BP Pulse Ht In Wt Lb BMI BSA Pain Score 05/17/2019        5/10    DIAGNOSTIC RESULTS:  X-Ray 02/13/2019 1. 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  2. Mild narrowing T11-12 disc space with anterior osteophyte  3. Aortic atherosclerosis. Branch vessel calcifications.      IMPRESSION:  Upon examination over video, patient reports his pain radiates from his lower back into bilateral hips and down both legs. Patient reports he experiences some burning and numbness. Discussed possibility of meralgia paresthetica. Patient reports pain in the thigh and buttock after walking. Patient reports that he sometimes has thigh pain without buttock pain. Patient reports pain in his groin occasionally. Discussed weight loss with patient, though patient reports his pain has worsened since losing 25 lbs. Patient reports walking down the stairs and feeling his right leg "buckle" occasionally. Discussed patient trying gabapentin and receiving imaging of the lumbar spine. Suggested water aerobics to patient as form of low impact exercise.  Greater than fifty percent of the total telehealth visit was spent discussing coordination and care of the presenting problems. I have personally visualized films and provided my independent interpretation. A plan of care has been determined and provided within the note. A total of 15 minutes were spent during the visit.   PLAN: 1) Prescribed 300 mg gabapentin up to 3x daily, slowly increasing dosage from once daily up to three times.  2) Return for lumbar spine MRI 3) Follow up in clinic for physical exam and to review imaging       Pain Management Plan Pain Scale: 5/10. Method: Numeric Pain Intensity Scale. Location: back, hips and thighs. Onset: 05/16/2018. Duration: varies. Quality: discomforting. Pain management follow-up plan of care: Patient is taking OTC pain relieves for relief.  Fall Risk Plan The patient has not fallen in the last year.     MEDICATIONS PRESCRIBED TODAY    Rx Quantity Refills NEURONTIN 300 mg  90 1           Provider:  Marchia Meiers. Vertell Limber MD  05/17/2019 10:17 AM    Dictation edited by: Judd Gaudier    CC Providers: Shawnie Dapper  [address] [place], [place]  [address]-  Electronically signed by Danae Orleans. Venetia Maxon MD on 05/23/2019 01:08 PM

## 2020-12-03 NOTE — Brief Op Note (Signed)
12/03/2020  5:33 PM  PATIENT:  [name] [name]  70 y.o. male  PRE-OPERATIVE DIAGNOSIS:  Degenerative Lumbar spinal stenosis, Recurrent disc herniation, Chronic bilateral low back pain with bilateral sciatica, lumbar spondylolisthesis, lumbago, radiculopathy L 45, L 5 S 1 levels  POST-OPERATIVE DIAGNOSIS:   Degenerative Lumbar spinal stenosis, Recurrent disc herniation, Chronic bilateral low back pain with bilateral sciatica, lumbar spondylolisthesis, lumbago, radiculopathy L 45, L 5 S 1 levels  PROCEDURE:  Procedure(s): Lumbar four-five Lumbar five Sacral one Posterior lumbar interbody fusion (N/A) redo decompression with PEEK interbody cages and local autograft, pedicle screw fixation and posterolateral arthrodesis  SURGEON:  Surgeon(s) and Role:    Erline Levine, MD - Primary    Kristeen Miss, MD - Assisting  PHYSICIAN ASSISTANT: Glenford Peers, NP  ASSISTANTS: Poteat, RN   ANESTHESIA:   general  EBL:  150 mL   BLOOD ADMINISTERED:none  DRAINS: none   LOCAL MEDICATIONS USED:  MARCAINE    and LIDOCAINE   SPECIMEN:  No Specimen  DISPOSITION OF SPECIMEN:  N/A  COUNTS:  YES  TOURNIQUET:  * No tourniquets in log *  DICTATION: Patient is 70 year old man with  HNP, spondylosis, stenosis, DDD, radiculopathy L4/5, L5/S1. He underwent prior laminectomy at L 45 but now has recurrent disc herniation and severe central and bi-foraminal stenosis. It was elected to take him to surgery for redo decompression and fusion at L4/5 and L5/S1 levels.    Procedure: Patient was placed in a prone position on the [place]thing[place] table after smooth and uncomplicated induction of general endotracheal anesthesia. His low back was prepped and draped in usual sterile fashion with betadine scrub and DuraPrep after relevant bony anatomy was localized with the C arm. Area of incision was infiltrated with local lidocaine. Incision was made to the lumbodorsal fascia was incised and exposure was performed of the L4/5,  L5/S1 spinous processes laminae facet joint and transverse processes. Intraoperative x-ray was obtained which confirmed correct orientation. A total laminectomy of L4 and L5 was performed with disarticulation of the facet joints at this level and thorough decompression was performed of both L4, L5 and S1 nerve roots along with the common dural tube. There was densely adherent spondylytic material compressing the thecal sac and both L4 ,  L5 and S 1 nerve roots.  Decompression was greater than would be typically performed for simple interbody fusion and involved redo decompression through previous laminectomy site. A thorough discectomy and preparation of the endplates was performed at both the L4/5 and L5/S1 levels.  The interspaces were packed with extra small BMP,autograft and PEEK cages (10 mm at L5/S1 and 10 mm at L4/5) . At L 45, 10 x 9 x 23 mm x 8 degree cages were placed with 10 cc autograft medial to the second cage.    At L 5 S 1 , 10 x 9 x 23 mm x 12 degree cages were placed with 10 cc autograft medial to the second cage. The posterolateral region was extensively decorticated and pedicle probes were placed at L4, L5 and S1 bilaterally. Intraoperative fluoroscopy confirmed correct orientationin the AP and lateral plane. 45 x 6.5 mm pedicle screws were placed at S1 bilaterally and 50 x 6.5 mm screws placed at L5 bilaterally and 50 x 6.10mm screws were placed at L4 bilaterally.  Final x-rays demonstrated well-positioned interbody grafts and pedicle screw fixation. 70 mm lordotic rods were placed and locked down in situ and the posterolateral region was packed with the remaining 20 cc  bone autograft on the left. Long-acting Marcaine was injected in the deep musculature.   Fascia was closed with 1 Vicryl sutures skin edges were reapproximated 2 and 3-0 Vicryl sutures. The wound is dressed with Dermabond and an occlusive dressing. The patient was extubated in the operating room and taken to recovery in stable  satisfactory condition having tolerated the operation well. Counts were correct at the end of the case.   PLAN OF CARE: Admit to inpatient   PATIENT DISPOSITION:  PACU - hemodynamically stable.   Delay start of Pharmacological VTE agent (>24hrs) due to surgical blood loss or risk of bleeding: yes

## 2020-12-03 NOTE — Progress Notes (Signed)
Orthopedic Tech Progress Note Patient Details:  [name] [name] [birth year] [id number] Ordered brace Patient ID: [name], male   DOB: June 04, 1951, 70 y.o.   MRN: [id number]   Michelle Piper 12/03/2020, 7:00 PM

## 2020-12-03 NOTE — Transfer of Care (Signed)
Immediate Anesthesia Transfer of Care Note  Patient: [name] [name]  Procedure(s) Performed: Lumbar four-five Lumbar five Sacral one Posterior lumbar interbody fusion (N/A Back)  Patient Location: PACU  Anesthesia Type:General  Level of Consciousness: awake, alert  and oriented  Airway & Oxygen Therapy: Patient Spontanous Breathing and Patient connected to face mask oxygen  Post-op Assessment: Report given to RN and Post -op Vital signs reviewed and stable  Post vital signs: Reviewed and stable  Last Vitals:  Vitals Value Taken Time  BP 121/71 12/03/20 1730  Temp    Pulse 89 12/03/20 1731  Resp 21 12/03/20 1731  SpO2 100 % 12/03/20 1731  Vitals shown include unvalidated device data.  Last Pain:  Vitals:   12/03/20 1108  TempSrc: Oral         Complications: No complications documented.

## 2020-12-03 NOTE — Progress Notes (Signed)
Awake, alert, conversant.  MAEW with full power DF/PF/EHL.  Sore in back.  No numbness.  Patient is doing well.

## 2020-12-03 NOTE — Anesthesia Preprocedure Evaluation (Addendum)
Anesthesia Evaluation  Patient identified by MRN, date of birth, ID band Patient awake    Reviewed: Allergy & Precautions, H&P , NPO status , Patient's Chart, lab work & pertinent test results  Airway Mallampati: II  TM Distance: >3 FB Neck ROM: Full    Dental  (+) Teeth Intact, Missing, Dental Advisory Given,    Pulmonary sleep apnea and Continuous Positive Airway Pressure Ventilation , former smoker,    Pulmonary exam normal breath sounds clear to auscultation       Cardiovascular (-) hypertensionNormal cardiovascular exam Rhythm:Regular Rate:Normal     Neuro/Psych Depression negative neurological ROS     GI/Hepatic negative GI ROS, Neg liver ROS,   Endo/Other  diabetes, Well Controlled, Type 2, Oral Hypoglycemic Agents, Insulin DependentObesity BMI 32 Took 1/2 normal dose of longacting insulin last night Last a1c 5.3 FS 96 in preop  Renal/GU negative Renal ROS  negative genitourinary   Musculoskeletal  (+) Arthritis , Osteoarthritis,  Degenerative lumbar stenosis, chronic B/L LBP with B/L sciatica   Abdominal (+) + obese,   Peds negative pediatric ROS (+)  Hematology negative hematology ROS (+)   Anesthesia Other Findings   Reproductive/Obstetrics negative OB ROS                            Anesthesia Physical Anesthesia Plan  ASA: II  Anesthesia Plan: General   Post-op Pain Management:    Induction: Intravenous  PONV Risk Score and Plan: 3 and Ondansetron, Dexamethasone, Midazolam and Treatment may vary due to age or medical condition  Airway Management Planned: Oral ETT  Additional Equipment:   Intra-op Plan:   Post-operative Plan: Extubation in OR  Informed Consent: I have reviewed the patients History and Physical, chart, labs and discussed the procedure including the risks, benefits and alternatives for the proposed anesthesia with the patient or authorized  representative who has indicated his/her understanding and acceptance.     Dental advisory given  Plan Discussed with: CRNA and Surgeon  Anesthesia Plan Comments:         Anesthesia Quick Evaluation

## 2020-12-03 NOTE — Progress Notes (Signed)
Pharmacy Antibiotic Note  [name] [name] is a 70 y.o. male admitted on 12/03/2020 for spinal surgery. Pharmacy has been consulted for vancomycin dosing with one dose as no drain is in place. Preop dose given ~1200.  Plan: Vancomycin 1000mg  x1 at midnight Pharmacy will sign off, reconsult if needed  Height: 5\' 10"  (177.8 cm) Weight: 102.1 kg (225 lb) IBW/kg (Calculated) : 73  Temp (24hrs), Avg:98.1 F (36.7 C), Min:97.9 F (36.6 C), Max:98.4 F (36.9 C)  Recent Labs  Lab 12/02/20 0913  WBC 6.4  CREATININE 0.94    Estimated Creatinine Clearance: 91.3 mL/min (by C-G formula based on SCr of 0.94 mg/dL).    Allergies  Allergen Reactions  . Simvastatin Other (See Comments)    myalgias  . Antihistamines, Diphenhydramine-Type Other (See Comments)    depression  . Codeine Nausea Only  . Penicillins Hives  . Sulfa Antibiotics Hives    , PharmD, BCPS, [facility] Clinical Pharmacist [phone] Please check AMION for all [facility] Pharmacy numbers 12/03/2020

## 2020-12-03 NOTE — Anesthesia Postprocedure Evaluation (Signed)
Anesthesia Post Note  Patient: [name] [name]  Procedure(s) Performed: Lumbar four-five Lumbar five Sacral one Posterior lumbar interbody fusion (N/A Back)     Patient location during evaluation: PACU Anesthesia Type: General Level of consciousness: awake and alert Pain management: pain level controlled Vital Signs Assessment: post-procedure vital signs reviewed and stable Respiratory status: spontaneous breathing, nonlabored ventilation, respiratory function stable and patient connected to nasal cannula oxygen Cardiovascular status: blood pressure returned to baseline and stable Postop Assessment: no apparent nausea or vomiting Anesthetic complications: no   No complications documented.  Last Vitals:  Vitals:   12/03/20 1730 12/03/20 1745  BP: 121/71 129/81  Pulse: 88 94  Resp: (!) 55 (!) 21  Temp: 36.6 C   SpO2: 100% 93%    Last Pain:  Vitals:   12/03/20 1745  TempSrc:   PainSc: 6                  Gena Laski S

## 2020-12-03 NOTE — Interval H&P Note (Signed)
History and Physical Interval Note:  12/03/2020 12:49 PM  [name] [name]  has presented today for surgery, with the diagnosis of Degenerative Lumbar spinal stenosis, Chronic bilateral low back pain with bilateral sciatica.  The various methods of treatment have been discussed with the patient and family. After consideration of risks, benefits and other options for treatment, the patient has consented to  Procedure(s) with comments: Lumbar 4-5 Lumbar 5 Sacral 1 Posterior lumbar interbody fusion (N/A) - 3C/RM 20 as a surgical intervention.  The patient's history has been reviewed, patient examined, no change in status, stable for surgery.  I have reviewed the patient's chart and labs.  Questions were answered to the patient's satisfaction.     [name] [name]

## 2020-12-04 DIAGNOSIS — M4316 Spondylolisthesis, lumbar region: Secondary | ICD-10-CM | POA: Diagnosis not present

## 2020-12-04 DIAGNOSIS — M48061 Spinal stenosis, lumbar region without neurogenic claudication: Secondary | ICD-10-CM | POA: Diagnosis not present

## 2020-12-04 DIAGNOSIS — M4727 Other spondylosis with radiculopathy, lumbosacral region: Secondary | ICD-10-CM | POA: Diagnosis not present

## 2020-12-04 DIAGNOSIS — I10 Essential (primary) hypertension: Secondary | ICD-10-CM | POA: Diagnosis not present

## 2020-12-04 DIAGNOSIS — E119 Type 2 diabetes mellitus without complications: Secondary | ICD-10-CM | POA: Diagnosis not present

## 2020-12-04 DIAGNOSIS — M5442 Lumbago with sciatica, left side: Secondary | ICD-10-CM | POA: Diagnosis not present

## 2020-12-04 LAB — GLUCOSE, CAPILLARY: Glucose-Capillary: 127 mg/dL — ABNORMAL HIGH (ref 70–99)

## 2020-12-04 MED ORDER — CHLORHEXIDINE GLUCONATE CLOTH 2 % EX PADS: 6.0000 | MEDICATED_PAD | Freq: Every day | CUTANEOUS | Status: DC

## 2020-12-04 MED FILL — Thrombin For Soln 5000 Unit: CUTANEOUS | Qty: 5000 | Status: AC

## 2020-12-04 NOTE — Discharge Instructions (Signed)

## 2020-12-04 NOTE — Progress Notes (Signed)
Subjective: Patient reports that he is doing well. No acute events reported overnight.   Objective: Vital signs in last 24 hours: Temp:  [97.7 F (36.5 C)-98.5 F (36.9 C)] 97.7 F (36.5 C) (01/05 0700) Pulse Rate:  [72-96] 72 (01/05 0700) Resp:  [11-55] 18 (01/05 0700) BP: (95-134)/(58-82) 102/63 (01/05 0700) SpO2:  [93 %-100 %] 100 % (01/05 0700) Weight:  [102.1 kg] 102.1 kg (01/04 1108)  Intake/Output from previous day: 01/04 0701 - 01/05 0700 In: 2528 [P.O.:150; I.V.:2178; IV Piggyback:200] Out: 525 [Urine:375; Blood:150] Intake/Output this shift: No intake/output data recorded.  Physical Exam: Patient is awake, A/O X 4, conversant, and in good spirits. Speech is fluent and appropriate. Doing well. MAEW with good strength that is symmetric bilaterally. 5/5 BUE/BLE. Dressing is clean dry intact. Incision is well approximated with no drainage, erythema, or edema.  Lab Results: Recent Labs    12/02/20 0913  WBC 6.4  HGB 12.2*  HCT 38.3*  PLT 271   BMET Recent Labs    12/02/20 0913  NA 137  K 3.9  CL 103  CO2 26  GLUCOSE 90  BUN 16  CREATININE 0.94  CALCIUM 9.2    Studies/Results: DG Lumbar Spine 2-3 Views  Result Date: 12/03/2020 CLINICAL DATA:  ELECTIVE SURGERY.  Posterior interbody fusion. EXAM: LUMBAR SPINE - 2-3 VIEW; DG C-ARM 1-60 MIN COMPARISON:  MRI of the lumbar spine October 20, 21. FINDINGS: Fluoro time: 40 seconds. Radiation: 31.35 mGy. Three C-arm fluoroscopic images were obtained intraoperatively and submitted for post operative interpretation. These images demonstrate bilateral pedicle screw placement at L4, L5, and S1 with interbody spacers at L4-L5 and L5-S1. Please see the performing provider's procedural report for further detail. IMPRESSION: Intraoperative fluoroscopic images, as detailed above. Electronically Signed   By: Feliberto Harts MD   On: 12/03/2020 17:27   DG C-Arm 1-60 Min  Result Date: 12/03/2020 CLINICAL DATA:  ELECTIVE SURGERY.   Posterior interbody fusion. EXAM: LUMBAR SPINE - 2-3 VIEW; DG C-ARM 1-60 MIN COMPARISON:  MRI of the lumbar spine October 20, 21. FINDINGS: Fluoro time: 40 seconds. Radiation: 31.35 mGy. Three C-arm fluoroscopic images were obtained intraoperatively and submitted for post operative interpretation. These images demonstrate bilateral pedicle screw placement at L4, L5, and S1 with interbody spacers at L4-L5 and L5-S1. Please see the performing provider's procedural report for further detail. IMPRESSION: Intraoperative fluoroscopic images, as detailed above. Electronically Signed   By: Feliberto Harts MD   On: 12/03/2020 17:27    Assessment/Plan: Patient is post-op day 1 s/p L4/5, L5/S1 PLIF. He is recovering well and reports a resolution of his preoperative symptoms.  His only complaint is mild incisional discomfort.  He has ambulated in the hall with nursing staff. He is awaiting PT/OT evaluation.  Continue LSO brace when OOB. Continue working on pain control, mobility and ambulating patient. Will plan for discharge today.    LOS: 1 day    Council Mechanic, DNP, NP-C 12/04/2020, 9:42 AM

## 2020-12-04 NOTE — Care Management CC44 (Signed)
Condition Code 44 Documentation Completed  Patient Details  Name: [name] [name] MRN: [id number] Date of Birth: Oct 03, 1951   Condition Code 44 given:  Yes Patient signature on Condition Code 44 notice:  Yes Documentation of 2 MD's agreement:  Yes Code 44 added to claim:  Yes    Kermit Balo, RN 12/04/2020, 3:53 PM

## 2020-12-04 NOTE — TOC Transition Note (Signed)
Transition of Care [facility]) - CM/SW Discharge Note   Patient Details  Name: [name] [name] MRN: [id number] Date of Birth: 1951-04-02  [facility] CM/SW Contact:  Kermit Balo, RN Phone Number: 12/04/2020, 4:17 PM   Clinical Narrative:    Pt discharging home with wife. Recommendations for outpatient therapy. Pt will follow up with surgeon and determine at that visit when to start outpatient therapy.  No DME needs.  Pt has transport home.   Final next level of care: Home/Self Care Barriers to Discharge: No Barriers Identified   Patient Goals and CMS Choice        Discharge Placement                       Discharge Plan and Services                                     Social Determinants of Health (SDOH) Interventions     Readmission Risk Interventions No flowsheet data found.

## 2020-12-04 NOTE — Care Management Obs Status (Signed)
MEDICARE OBSERVATION STATUS NOTIFICATION   Patient Details  Name: [name] [name] MRN: [id number] Date of Birth: [birth year]   Medicare Observation Status Notification Given:  Yes    Kermit Balo, RN 12/04/2020, 3:53 PM

## 2020-12-04 NOTE — Evaluation (Signed)
Occupational Therapy Evaluation Patient Details Name: [name] [name] MRN: [id number] DOB: 07/07/1951 Today's Date: 12/04/2020    History of Present Illness 70 y.o. male with PMH including lumbar laminectomy, depression, DM, HTN, restless leg syndrome. Pt with chronic back pain, MRI demonstrating severe spinal stenosis at L4-5 and L5-S1. Pt underwent L4-S1 PLIF with redo decompression on 12/03/2020.   Clinical Impression   PTA, pt was living at home with his wife, and reports he was independent with ADL/IADL and modified independent with functional mobility at spc level. Pt currently requires supervision-minguard for ADL completion and functional mobility. Pt demonstrates minor instability when turning. Pt demonstrates ability to complete LB dressing, grooming and transfers while adhering to back precautions. Pt's wife present during session and demonstrated ability to safely cue and assist pt with ADL and mobility. Patient evaluated by Occupational Therapy with no further acute OT needs identified. All education has been completed and the patient has no further questions. See below for any follow-up Occupational Therapy or equipment needs. OT to sign off. Thank you for referral.      Follow Up Recommendations  No OT follow up    Equipment Recommendations  None recommended by OT    Recommendations for Other Services       Precautions / Restrictions Precautions Precautions: Fall Required Braces or Orthoses: Spinal Brace Spinal Brace: Lumbar corset;Applied in sitting position Restrictions Weight Bearing Restrictions: No      Mobility Bed Mobility Overal bed mobility: Needs Assistance Bed Mobility: Rolling;Sidelying to Sit Rolling: Supervision Sidelying to sit: Supervision            Transfers Overall transfer level: Needs assistance Equipment used: None Transfers: Sit to/from Stand Sit to Stand: Min guard         General transfer comment: minguard from lower  surface    Balance Overall balance assessment: Needs assistance Sitting-balance support: No upper extremity supported;Feet supported Sitting balance-Leahy Scale: Good     Standing balance support: No upper extremity supported;During functional activity Standing balance-Leahy Scale: Good                             ADL either performed or assessed with clinical judgement   ADL Overall ADL's : Needs assistance/impaired Eating/Feeding: Independent   Grooming: Supervision/safety;Standing   Upper Body Bathing: Supervision/ safety;Sitting   Lower Body Bathing: Min guard;Sit to/from stand   Upper Body Dressing : Supervision/safety;Sitting Upper Body Dressing Details (indicate cue type and reason): able to don/doff back brace Lower Body Dressing: Min guard;Sit to/from stand Lower Body Dressing Details (indicate cue type and reason): pt able to [employer] Transfer: Min guard;Ambulation Toilet Transfer Details (indicate cue type and reason): minguard for minor instability noted with turning Toileting- Water quality scientist and Hygiene: Min guard;Sit to/from stand       Functional mobility during ADLs: Min guard;Cane General ADL Comments: pt with instability noted when turning, educated pt on fall prevention strategies/importance of taking his time with mobility, compensatory strategies for ADL completion, and environmental modifications     Vision         Perception     Praxis      Pertinent Vitals/Pain Pain Assessment: 0-10 Pain Score: 4  Pain Location: back Pain Descriptors / Indicators: Sore Pain Intervention(s): Monitored during session;Limited activity within patient's tolerance;Repositioned     Hand Dominance Right   Extremity/Trunk Assessment Upper Extremity Assessment Upper Extremity Assessment: Overall WFL for tasks assessed   Lower Extremity  Assessment Lower Extremity Assessment: Overall WFL for tasks assessed   Cervical / Trunk  Assessment Cervical / Trunk Assessment: Other exceptions Cervical / Trunk Exceptions: s/p spinal fusion   Communication Communication Communication: No difficulties   Cognition Arousal/Alertness: Awake/alert Behavior During Therapy: WFL for tasks assessed/performed Overall Cognitive Status: Within Functional Limits for tasks assessed                                     General Comments  vss    Exercises     Shoulder Instructions      Home Living Family/patient expects to be discharged to:: Private residence Living Arrangements: Spouse/significant other Available Help at Discharge: Family;Available 24 hours/day Type of Home: House Home Access: Stairs to enter [employer] of Steps: 5 Entrance Stairs-Rails: Right Home Layout: Two level;Able to live on main level with bedroom/bathroom Alternate Level Stairs-Number of Steps: 20 Alternate Level Stairs-Rails: Right Bathroom Shower/Tub: Producer, television/film/video: Handicapped height     Home Equipment: Cane - single point          Prior Functioning/Environment Level of Independence: Independent with assistive device(s)        Comments: utilizing a cane recently due to back pain, short household distances        OT Problem List: Impaired balance (sitting and/or standing);Decreased knowledge of precautions;Pain      OT Treatment/Interventions:      OT Goals(Current goals can be found in the care plan section) Acute Rehab OT Goals Patient Stated Goal: to go home OT Goal Formulation: With patient Time For Goal Achievement: 12/18/20 Potential to Achieve Goals: Good  OT Frequency:     Barriers to D/C:            Co-evaluation              AM-PAC OT "6 Clicks" Daily Activity     Outcome Measure Help from another person eating meals?: None Help from another person taking care of personal grooming?: A Little Help from another person toileting, which includes using toliet,  bedpan, or urinal?: A Little Help from another person bathing (including washing, rinsing, drying)?: A Little Help from another person to put on and taking off regular upper body clothing?: A Little Help from another person to put on and taking off regular lower body clothing?: A Little 6 Click Score: 19   End of Session Equipment Utilized During Treatment: Other (comment) (cane) Nurse Communication: Mobility status  Activity Tolerance: Patient tolerated treatment well Patient left: in bed;with call bell/phone within reach;with bed alarm set;with family/visitor present  OT Visit Diagnosis: Other abnormalities of gait and mobility (R26.89);Pain Pain - part of body:  (back)                Time: 1432-1500 OT Time Calculation (min): 28 min Charges:  OT General Charges $OT Visit: 1 Visit OT Evaluation $OT Eval Low Complexity: 1 Low OT Treatments $Self Care/Home Management : 8-22 mins  Rosey Bath OTR/L Acute Rehabilitation Services Office: [phone]   Rebeca Alert 12/04/2020, 3:51 PM

## 2020-12-04 NOTE — Discharge Summary (Signed)
Physician Discharge Summary  Patient ID: [name] [name] MRN: [id number] DOB/AGE: Oct 19, 1951 70 y.o.  Admit date: 12/03/2020 Discharge date: 12/04/2020  Admission Diagnoses: Degenerative Lumbar spinal stenosis, Recurrent disc herniation, Chronic bilateral low back pain with bilateral sciatica, lumbar spondylolisthesis, lumbago, radiculopathy L 45, L 5 S 1 levels  Discharge Diagnoses: Degenerative Lumbar spinal stenosis, Recurrent disc herniation, Chronic bilateral low back pain with bilateral sciatica, lumbar spondylolisthesis, lumbago, radiculopathy L 45, L 5 S 1 levels  Active Problems:   Spondylolisthesis of lumbar region   Discharged Condition: good  Hospital Course: The patient was admitted on 12/03/2020 and taken to the operating room where the patient underwent decompression and fusion. The patient tolerated the procedure well and was taken to the recovery room and then to the floor in stable condition. The hospital course was routine. There were no complications. The wound remained clean dry and intact. Pt had appropriate back soreness. No complaints of leg pain or new N/T/W. The patient remained afebrile with stable vital signs, and tolerated a regular diet. The patient continued to increase activities, and pain was well controlled with oral pain medications.   Consults: None  Significant Diagnostic Studies: radiology: X-Ray  Treatments: surgery: Lumbar four-five Lumbar five Sacral one Posterior lumbar interbody fusion (N/A) redo decompression with PEEK interbody cages and local autograft, pedicle screw fixation and posterolateral arthrodesis  Discharge Exam: Blood pressure 102/63, pulse 72, temperature 97.7 F (36.5 C), temperature source Oral, resp. rate 18, height 5\' 10"  (1.778 m), weight 102.1 kg, SpO2 100 %.  Physical Exam: Patient is awake, A/O X 4, conversant, and in good spirits. Speech is fluent and appropriate. Doing well. MAEW with good strength that is symmetric  bilaterally. 5/5 BUE/BLE. Dressing is clean dry intact. Incision is well approximated with no drainage, erythema, or edema.   Disposition:  Home / Self care  Allergies as of 12/04/2020      Reactions   Simvastatin Other (See Comments)   myalgias   Antihistamines, Diphenhydramine-type Other (See Comments)   depression   Codeine Nausea Only   Penicillins Hives   Sulfa Antibiotics Hives      Medication List    STOP taking these medications   celecoxib 200 MG capsule Commonly known as: CELEBREX   Naproxen Sodium 220 MG Caps     TAKE these medications   aspirin 81 MG EC tablet Take 1 tablet (81 mg total) by mouth daily.   atorvastatin 80 MG tablet Commonly known as: LIPITOR TAKE 1 TABLET(80 MG) BY MOUTH DAILY What changed: See the new instructions.   azelastine 0.1 % nasal spray Commonly known as: ASTELIN Place 2 sprays into both nostrils 2 (two) times daily. Use in each nostril as directed   BD Pen Needle Nano 2nd Gen 32G X 4 MM Misc Generic drug: Insulin Pen Needle USE TO INJECT 4 TIMES DAILY   buPROPion 150 MG 12 hr tablet Commonly known as: WELLBUTRIN SR Take 1 tablet (150 mg total) by mouth 2 (two) times daily.   escitalopram 20 MG tablet Commonly known as: LEXAPRO Take 1 tablet (20 mg total) by mouth daily.   Fenofibric Acid 135 MG Cpdr TAKE 1 CAPSULE BY MOUTH DAILY What changed: how much to take   finasteride 5 MG tablet Commonly known as: PROSCAR TAKE 1 TABLET(5 MG) BY MOUTH DAILY What changed: See the new instructions.   fluticasone 50 MCG/ACT nasal spray Commonly known as: FLONASE Place 2 sprays into both nostrils daily. What changed:   when to  take this  reasons to take this   gabapentin 300 MG capsule Commonly known as: NEURONTIN Take 900 mg by mouth at bedtime.   glucose blood test strip Per Insurance Preference- Test sugars up to three times daily DX E11.9   glucose blood test strip Commonly known as: ONE TOUCH ULTRA TEST 1 each by  Other route 3 (three) times daily. Test sugars up to three times daily. ( insurance preference  DX E11.65 E11.9)   Krill Oil 500 MG Caps Take 500 mg by mouth daily.   Levemir FlexTouch 100 UNIT/ML FlexPen Generic drug: insulin detemir Inject 56 Units into the skin every evening. ADMINISTER 56 UNITS UNDER THE SKIN EVERY DAY   metFORMIN 1000 MG tablet Commonly known as: GLUCOPHAGE TAKE 1 TABLET BY MOUTH TWICE DAILY WITH A MEAL What changed: See the new instructions.   OVER THE COUNTER MEDICATION Take 1 capsule by mouth at bedtime. Primal Labs Sleep Refined Supplement  Melatonin 4 mg + supplements   Ozempic (1 MG/DOSE) 2 MG/1.5ML Sopn Generic drug: Semaglutide (1 MG/DOSE) Inject 1 mg into the skin once a week.   sodium chloride 0.65 % nasal spray Commonly known as: OCEAN Place 1 spray into the nose daily as needed.   tamsulosin 0.4 MG Caps capsule Commonly known as: FLOMAX TAKE 2 CAPSULES(0.8 MG) BY MOUTH AT BEDTIME What changed: See the new instructions.   vitamin B-12 1000 MCG tablet Commonly known as: CYANOCOBALAMIN Take 2,000 mcg by mouth daily.   Vitamin D (Ergocalciferol) 1.25 MG (50000 UNIT) Caps capsule Commonly known as: DRISDOL Take 1 capsule (50,000 Units total) by mouth every 7 (seven) days.        Signed: Council Mechanic, DNP, NP-C 12/04/2020, 9:45 AM

## 2020-12-04 NOTE — Evaluation (Signed)
Physical Therapy Evaluation Patient Details Name: ROSBEL AMADOR MRN: [id number] DOB: 1950-12-19 Today's Date: 12/04/2020   History of Present Illness  70 y.o. male with PMH including lumbar laminectomy, depression, DM, HTN, restless leg syndrome. Pt with chronic back pain, MRI demonstrating severe spinal stenosis at L4-5 and L5-S1. Pt underwent L4-S1 PLIF with redo decompression on 12/03/2020.  Clinical Impression  Pt presents to PT with deficits in activity tolerance, gait, balance. Pt reports much improved pain compared to baseline and ambulates well without UE support at this time. Pt does have one minor LOB with stair negotiation but is able to self-correct. Pt will benefit from continued acute PT POC and outpatient PT at the time of discharge to continue improving activity tolerance and dynamic balance quality.    Follow Up Recommendations Outpatient PT    Equipment Recommendations  None recommended by PT    Recommendations for Other Services       Precautions / Restrictions Precautions Precautions: Fall Required Braces or Orthoses: Spinal Brace Spinal Brace: Lumbar corset;Applied in sitting position Restrictions Weight Bearing Restrictions: No      Mobility  Bed Mobility Overal bed mobility: Needs Assistance Bed Mobility: Rolling;Sidelying to Sit Rolling: Supervision Sidelying to sit: Supervision            Transfers Overall transfer level: Needs assistance Equipment used: None Transfers: Sit to/from Stand Sit to Stand: Supervision            Ambulation/Gait Ambulation/Gait assistance: Supervision Gait Distance (Feet): 200 Feet Assistive device: None Gait Pattern/deviations: Step-through pattern Gait velocity: reduced Gait velocity interpretation: 1.31 - 2.62 ft/sec, indicative of limited community ambulator General Gait Details: steady step-through gait, reduced gait speed  Stairs Stairs: Yes Stairs assistance: Supervision Stair Management:  One rail Right;Step to pattern;Forwards Number of Stairs: 2 General stair comments: 2 trials of 2 steps  Wheelchair Mobility    Modified Rankin (Stroke Patients Only)       Balance Overall balance assessment: Needs assistance Sitting-balance support: No upper extremity supported;Feet supported Sitting balance-Leahy Scale: Good     Standing balance support: No upper extremity supported;During functional activity Standing balance-Leahy Scale: Good                               Pertinent Vitals/Pain Pain Assessment: Faces Faces Pain Scale: Hurts little more Pain Location: back Pain Descriptors / Indicators: Sore Pain Intervention(s): Monitored during session    Home Living Family/patient expects to be discharged to:: Private residence Living Arrangements: Spouse/significant other Available Help at Discharge: Family;Available 24 hours/day Type of Home: House Home Access: Stairs to enter Entrance Stairs-Rails: Right Entrance Stairs-Number of Steps: 5 Home Layout: Two level;Able to live on main level with bedroom/bathroom Home Equipment: Cane - single point      Prior Function Level of Independence: Independent with assistive device(s)         Comments: utilizing a cane recently due to back pain, short household distances     Hand Dominance        Extremity/Trunk Assessment   Upper Extremity Assessment Upper Extremity Assessment: Overall WFL for tasks assessed    Lower Extremity Assessment Lower Extremity Assessment: Overall WFL for tasks assessed    Cervical / Trunk Assessment Cervical / Trunk Assessment: Other exceptions Cervical / Trunk Exceptions: s/p spinal fusion  Communication   Communication: No difficulties  Cognition Arousal/Alertness: Awake/alert Behavior During Therapy: WFL for tasks assessed/performed Overall Cognitive Status: Within Functional Limits  for tasks assessed                                         General Comments General comments (skin integrity, edema, etc.): VSS on RA    Exercises     Assessment/Plan    PT Assessment Patient needs continued PT services  PT Problem List Decreased activity tolerance;Decreased balance;Decreased mobility;Decreased knowledge of use of DME;Decreased knowledge of precautions       PT Treatment Interventions DME instruction;Gait training;Stair training;Functional mobility training;Balance training;Therapeutic activities;Neuromuscular re-education;Cognitive remediation;Patient/family education    PT Goals (Current goals can be found in the Care Plan section)  Acute Rehab PT Goals Patient Stated Goal: to go home PT Goal Formulation: With patient/family Time For Goal Achievement: 12/18/20 Potential to Achieve Goals: Good    Frequency Min 5X/week   Barriers to discharge        Co-evaluation               AM-PAC PT "6 Clicks" Mobility  Outcome Measure Help needed turning from your back to your side while in a flat bed without using bedrails?: A Little Help needed moving from lying on your back to sitting on the side of a flat bed without using bedrails?: A Little Help needed moving to and from a bed to a chair (including a wheelchair)?: A Little Help needed standing up from a chair using your arms (e.g., wheelchair or bedside chair)?: A Little Help needed to walk in hospital room?: A Little Help needed climbing 3-5 steps with a railing? : A Little 6 Click Score: 18    End of Session Equipment Utilized During Treatment: Back brace Activity Tolerance: Patient tolerated treatment well Patient left: in chair;with call bell/phone within reach;with chair alarm set Nurse Communication: Mobility status PT Visit Diagnosis: Other abnormalities of gait and mobility (R26.89)    Time: 9371-6967 PT Time Calculation (min) (ACUTE ONLY): 22 min   Charges:   PT Evaluation $PT Eval Low Complexity: 1 Low          Arlyss Gandy, PT, DPT Acute  Rehabilitation Pager: [phone]   Arlyss Gandy 12/04/2020, 10:00 AM

## 2020-12-05 MED FILL — Sodium Chloride IV Soln 0.9%: INTRAVENOUS | Qty: 1000 | Status: AC

## 2020-12-05 MED FILL — Heparin Sodium (Porcine) Inj 1000 Unit/ML: INTRAMUSCULAR | Qty: 30 | Status: AC

## 2020-12-09 ENCOUNTER — Encounter (INDEPENDENT_AMBULATORY_CARE_PROVIDER_SITE_OTHER): Payer: Medicare Other | Admitting: Ophthalmology

## 2020-12-12 ENCOUNTER — Other Ambulatory Visit (INDEPENDENT_AMBULATORY_CARE_PROVIDER_SITE_OTHER): Payer: Self-pay | Admitting: Physician Assistant

## 2020-12-12 DIAGNOSIS — F3289 Other specified depressive episodes: Secondary | ICD-10-CM

## 2020-12-12 NOTE — Telephone Encounter (Signed)
Ok to send    thanks

## 2020-12-12 NOTE — Telephone Encounter (Signed)
Last OV with Tracey 

## 2020-12-26 DIAGNOSIS — Z6833 Body mass index (BMI) 33.0-33.9, adult: Secondary | ICD-10-CM | POA: Diagnosis not present

## 2020-12-26 DIAGNOSIS — I10 Essential (primary) hypertension: Secondary | ICD-10-CM | POA: Diagnosis not present

## 2020-12-26 DIAGNOSIS — M48061 Spinal stenosis, lumbar region without neurogenic claudication: Secondary | ICD-10-CM | POA: Diagnosis not present

## 2020-12-26 DIAGNOSIS — M5416 Radiculopathy, lumbar region: Secondary | ICD-10-CM | POA: Diagnosis not present

## 2020-12-31 ENCOUNTER — Ambulatory Visit (INDEPENDENT_AMBULATORY_CARE_PROVIDER_SITE_OTHER): Payer: Medicare Other | Admitting: Physician Assistant

## 2020-12-31 ENCOUNTER — Other Ambulatory Visit: Payer: Self-pay

## 2020-12-31 ENCOUNTER — Encounter (INDEPENDENT_AMBULATORY_CARE_PROVIDER_SITE_OTHER): Payer: Self-pay | Admitting: Physician Assistant

## 2020-12-31 VITALS — BP 117/70 | HR 74 | Temp 98.0°F | Ht 70.0 in | Wt 230.0 lb

## 2020-12-31 DIAGNOSIS — E1169 Type 2 diabetes mellitus with other specified complication: Secondary | ICD-10-CM

## 2020-12-31 DIAGNOSIS — E559 Vitamin D deficiency, unspecified: Secondary | ICD-10-CM

## 2020-12-31 DIAGNOSIS — E669 Obesity, unspecified: Secondary | ICD-10-CM | POA: Diagnosis not present

## 2020-12-31 DIAGNOSIS — Z6833 Body mass index (BMI) 33.0-33.9, adult: Secondary | ICD-10-CM | POA: Diagnosis not present

## 2020-12-31 DIAGNOSIS — Z794 Long term (current) use of insulin: Secondary | ICD-10-CM | POA: Diagnosis not present

## 2020-12-31 MED ORDER — VITAMIN D (ERGOCALCIFEROL) 1.25 MG (50000 UNIT) PO CAPS
50000.0000 [IU] | ORAL_CAPSULE | ORAL | 0 refills | Status: DC
Start: 2020-12-31 — End: 2021-05-26

## 2021-01-01 NOTE — Progress Notes (Unsigned)
Chief Complaint:   OBESITY [name] [name] is here to discuss his progress with his obesity treatment plan along with follow-up of his obesity related diagnoses. [name] [name] is on keeping a food journal and adhering to recommended goals of 1800-2000 calories and 120 grams of protein daily and states he is following his eating plan approximately 65% of the time. [name] [name] states he is doing 0 minutes 0 times per week.  Today's visit was #: 59 Starting weight: 272 lbs Starting date: 02/14/2018 Today's weight: 230 lbs Today's date: 12/31/2020 Total lbs lost to date: 42 Total lbs lost since last in-office visit: 0  Interim History: [name] [name] states that he has not been following the meal plan. Some days he cares about it and some days he doesn't. He is feeling good since his recent back surgery and he is ready to get back on track.  Subjective:   1. Vitamin D deficiency [name] [name]'s last Vit D level was at goal. He is on prescription Vit D weekly.  2. Type 2 diabetes mellitus with other specified complication, with long-term current use of insulin ([name] [name]) [name] [name] is on Levemir, [name] [name], and metformin, and he denies nausea, vomiting, or diarrhea. He reports polyphagia, but denies hypoglycemia. He is managed by his primary care physician.  Assessment/Plan:   1. Vitamin D deficiency Low Vitamin D level contributes to fatigue and are associated with obesity, breast, and colon cancer. We will refill prescription Vitamin D for 90 days with no refills. [name] [name] will follow-up for routine testing of Vitamin D, at least 2-3 times per year to avoid over-replacement.  - Vitamin D, Ergocalciferol, (DRISDOL) 1.25 MG (50000 UNIT) CAPS capsule; Take 1 capsule (50,000 Units total) by mouth every 7 (seven) days.  Dispense: 12 capsule; Refill: 0  2. 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 Intensive lifestyle modification including diet, exercise and weight loss are the first line of treatment for diabetes. [name] [name] will follow up with his primary care physician, and will continue his medications.  3. Class 1 obesity with serious comorbidity and body mass index (BMI) of 33.0 to 33.9 in adult, unspecified obesity type [name] [name] is currently in the action stage of change. As such, his goal is to continue with weight loss efforts. He has agreed to keeping a food journal and adhering to recommended goals of 1800-2000 calories and 125 grams of protein daily.   We will recheck labs at his next visit.  Exercise goals: No exercise has been prescribed at this time.  Behavioral modification strategies: meal planning and cooking strategies and keeping healthy foods in the home.  [name] [name] has agreed to follow-up with our clinic in 2 weeks. He was informed of the importance of frequent follow-up visits to maximize his success with intensive lifestyle modifications for his multiple health conditions.   Objective:   Blood pressure 117/70, pulse 74, temperature 98 F (36.7 C), height 5\' 10"  (1.778 m), weight 230 lb (104.3 kg), SpO2 96 %. Body mass index is 33 kg/m.  General: Cooperative, alert, well developed, in no acute distress. HEENT: Conjunctivae and lids unremarkable. Cardiovascular: Regular rhythm.  Lungs: Normal work of breathing. Neurologic: No focal deficits.   Lab Results  Component Value Date   CREATININE 0.94 12/02/2020   BUN 16 12/02/2020   NA 137 12/02/2020   K  3.9 12/02/2020   CL 103 12/02/2020   CO2 26 12/02/2020   Lab Results  Component Value Date   ALT 23 08/29/2020   AST 17 08/29/2020   ALKPHOS 29 (L) 08/29/2020   BILITOT 0.3 08/29/2020   Lab Results  Component Value Date   HGBA1C 5.3 12/02/2020   HGBA1C 5.9 (H) 08/29/2020   HGBA1C 5.7 (H) 05/09/2020    HGBA1C 5.8 (H) 12/12/2019   HGBA1C 6.1 (H) 08/22/2019   Lab Results  Component Value Date   INSULIN 5.8 08/29/2020   INSULIN 5.3 05/09/2020   Lab Results  Component Value Date   TSH 1.25 02/05/2020   Lab Results  Component Value Date   CHOL 141 08/29/2020   HDL 49 08/29/2020   LDLCALC 77 08/29/2020   LDLDIRECT 112.0 07/31/2016   TRIG 78 08/29/2020   CHOLHDL 2.9 08/29/2020   Lab Results  Component Value Date   WBC 6.4 12/02/2020   HGB 12.2 (L) 12/02/2020   HCT 38.3 (L) 12/02/2020   MCV 88.5 12/02/2020   PLT 271 12/02/2020   No results found for: IRON, TIBC, FERRITIN  Obesity Behavioral Intervention:   Approximately 15 minutes were spent on the discussion below.  ASK: We discussed the diagnosis of obesity with [name] [name] today and [name] [name] agreed to give [name] permission to discuss obesity behavioral modification therapy today.  ASSESS: [name] [name] has the diagnosis of obesity and his BMI today is 33. [name] [name] is in the action stage of change.   ADVISE: [name] [name] was educated on the multiple health risks of obesity as well as the benefit of weight loss to improve his health. He was advised of the need for long term treatment and the importance of lifestyle modifications to improve his current health and to decrease his risk of future health problems.  AGREE: Multiple dietary modification options and treatment options were discussed and [name] [name] agreed to follow the recommendations documented in the above note.  ARRANGE: [name] [name] was educated on the importance of frequent visits to treat obesity as outlined per CMS and USPSTF guidelines and agreed to schedule his next follow up appointment today.  Attestation Statements:   Reviewed by clinician on day of visit: allergies, medications, problem list, medical history, surgical history, family history, social history, and previous encounter notes.   Wilhemena Durie, am acting as transcriptionist for [employer], PA-C.  I have reviewed  the above documentation for accuracy and completeness, and I agree with the above. Abby Potash, PA-C

## 2021-01-11 ENCOUNTER — Other Ambulatory Visit: Payer: Self-pay | Admitting: Family Medicine

## 2021-01-27 DIAGNOSIS — M5416 Radiculopathy, lumbar region: Secondary | ICD-10-CM | POA: Diagnosis not present

## 2021-01-27 DIAGNOSIS — M48061 Spinal stenosis, lumbar region without neurogenic claudication: Secondary | ICD-10-CM | POA: Diagnosis not present

## 2021-01-28 ENCOUNTER — Other Ambulatory Visit: Payer: Self-pay

## 2021-01-28 ENCOUNTER — Ambulatory Visit (INDEPENDENT_AMBULATORY_CARE_PROVIDER_SITE_OTHER): Payer: Medicare Other | Admitting: Physician Assistant

## 2021-01-28 ENCOUNTER — Encounter (INDEPENDENT_AMBULATORY_CARE_PROVIDER_SITE_OTHER): Payer: Self-pay | Admitting: Physician Assistant

## 2021-01-28 VITALS — BP 108/65 | HR 69 | Temp 97.7°F | Ht 70.0 in | Wt 223.0 lb

## 2021-01-28 DIAGNOSIS — E669 Obesity, unspecified: Secondary | ICD-10-CM

## 2021-01-28 DIAGNOSIS — F3289 Other specified depressive episodes: Secondary | ICD-10-CM

## 2021-01-28 DIAGNOSIS — Z794 Long term (current) use of insulin: Secondary | ICD-10-CM | POA: Diagnosis not present

## 2021-01-28 DIAGNOSIS — Z6832 Body mass index (BMI) 32.0-32.9, adult: Secondary | ICD-10-CM

## 2021-01-28 DIAGNOSIS — E119 Type 2 diabetes mellitus without complications: Secondary | ICD-10-CM | POA: Diagnosis not present

## 2021-01-28 DIAGNOSIS — E559 Vitamin D deficiency, unspecified: Secondary | ICD-10-CM | POA: Diagnosis not present

## 2021-01-28 DIAGNOSIS — E782 Mixed hyperlipidemia: Secondary | ICD-10-CM | POA: Diagnosis not present

## 2021-01-28 DIAGNOSIS — E66811 Obesity, class 1: Secondary | ICD-10-CM

## 2021-01-28 MED ORDER — BUPROPION HCL ER (SR) 150 MG PO TB12
150.0000 mg | ORAL_TABLET | Freq: Two times a day (BID) | ORAL | 0 refills | Status: DC
Start: 2021-01-28 — End: 2021-04-21

## 2021-01-29 LAB — COMPREHENSIVE METABOLIC PANEL
ALT: 15 IU/L (ref 0–44)
AST: 12 IU/L (ref 0–40)
Albumin/Globulin Ratio: 2.2 (ref 1.2–2.2)
Albumin: 4.3 g/dL (ref 3.8–4.8)
Alkaline Phosphatase: 43 IU/L — ABNORMAL LOW (ref 44–121)
BUN/Creatinine Ratio: 24 (ref 10–24)
BUN: 23 mg/dL (ref 8–27)
Bilirubin Total: 0.3 mg/dL (ref 0.0–1.2)
CO2: 22 mmol/L (ref 20–29)
Calcium: 9.5 mg/dL (ref 8.6–10.2)
Chloride: 103 mmol/L (ref 96–106)
Creatinine, Ser: 0.95 mg/dL (ref 0.76–1.27)
Globulin, Total: 2 g/dL (ref 1.5–4.5)
Glucose: 70 mg/dL (ref 65–99)
Potassium: 4.2 mmol/L (ref 3.5–5.2)
Sodium: 141 mmol/L (ref 134–144)
Total Protein: 6.3 g/dL (ref 6.0–8.5)
eGFR: 87 mL/min/{1.73_m2} (ref >59)

## 2021-01-29 LAB — VITAMIN D 25 HYDROXY (VIT D DEFICIENCY, FRACTURES): Vit D, 25-Hydroxy: 59 ng/mL (ref 30.0–100.0)

## 2021-01-29 LAB — LIPID PANEL
Chol/HDL Ratio: 2.7 ratio (ref 0.0–5.0)
Cholesterol, Total: 129 mg/dL (ref 100–199)
HDL: 48 mg/dL (ref >39)
LDL Chol Calc (NIH): 66 mg/dL (ref 0–99)
Triglycerides: 76 mg/dL (ref 0–149)
VLDL Cholesterol Cal: 15 mg/dL (ref 5–40)

## 2021-01-29 NOTE — Progress Notes (Signed)
Chief Complaint:   OBESITY Tuck is here to discuss his progress with his obesity treatment plan along with follow-up of his obesity related diagnoses. Isai is on keeping a food journal and adhering to recommended goals of 1800-2000 calories and 125 grams of protein daily and states he is following his eating plan approximately 85% of the time. Jesse states he is doing 0 minutes 0 times per week.  Today's visit was #: 73 Starting weight: 273 lbs Starting date: 02/14/2018 Today's weight: 223 lbs Today's date: 01/28/2021 Total lbs lost to date: 49 Total lbs lost since last in-office visit: 7  Interim History: [name] did well with weight loss. He notes needing a larger breakfast due to increased hunger. He is not journaling consistently. He notes snacking has decreased. He is starting physical therapy soon for his back.  Subjective:   1. Type 2 diabetes mellitus without complication, with long-term current use of insulin ([place]) Logyn had 2 episodes of hypoglycemia at 71 and 72, with no symptoms. His fasting BGs on an average are low 80's. He is on Levemir 56 units daily, and also on Ozempic 1 mg. Last A1c was 5.3 in 11/2020.  2. Mixed hyperlipidemia Avary is on Lipitor, and he denies myalgias or chest pain. He is walking intermittently.  3. Vitamin D deficiency Codey is on Vit D, and he denies nausea, vomiting, or muscle weakness. He is due to have labs.  4. Other depression, with emotional eating  Sherrod denies suicidal or homicidal ideas. He reports bupropion is helping with cravings.  Assessment/Plan:   1. Type 2 diabetes mellitus without complication, with long-term current use of insulin (HCC) Good blood sugar control is important to decrease the likelihood of diabetic complications such as nephropathy, neuropathy, limb loss, blindness, coronary artery disease, and death. Intensive lifestyle modification including diet, exercise and weight loss are the first line of  treatment for diabetes. Coal agreed to decrease Levemir to 53 units and call his primary care physician for an appointment regarding hypoglycemia.    - Lipid panel  2. Mixed hyperlipidemia Cardiovascular risk and specific lipid/LDL goals reviewed. We discussed several lifestyle modifications today. Mats will continue his medications, and will continue to work on diet, exercise and weight loss efforts. We will check labs today. Orders and follow up as documented in patient record.   Counseling Intensive lifestyle modifications are the first line treatment for this issue. . Dietary changes: Increase soluble fiber. Decrease simple carbohydrates. . Exercise changes: Moderate to vigorous-intensity aerobic activity 150 minutes per week if tolerated. . Lipid-lowering medications: see documented in medical record.  - Comprehensive metabolic panel  3. Vitamin D deficiency Low Vitamin D level contributes to fatigue and are associated with obesity, breast, and colon cancer. We will check labs today. Tashon agreed to continue taking prescription Vitamin D 50,000 IU every week and will follow-up for routine testing of Vitamin D, at least 2-3 times per year to avoid over-replacement.  - VITAMIN D 25 Hydroxy (Vit-D Deficiency, Fractures)  4. Other depression, with emotional eating  Behavior modification techniques were discussed today to help Luz deal with his emotional/non-hunger eating behaviors. We will refill bupropion for 90 days with no refills. Orders and follow up as documented in patient record.   - buPROPion (WELLBUTRIN SR) 150 MG 12 hr tablet; Take 1 tablet (150 mg total) by mouth 2 (two) times daily.  Dispense: 180 tablet; Refill: 0  5. Class 1 obesity with serious comorbidity and body mass index (  BMI) of 32.0 to 32.9 in adult, unspecified obesity type Danyal is currently in the action stage of change. As such, his goal is to continue with weight loss efforts. He has agreed to keeping a  food journal and adhering to recommended goals of 1800 calories and 125 grams of protein daily.   Exercise goals: No exercise has been prescribed at this time.  Behavioral modification strategies: planning for success and keeping a strict food journal.  Hermes has agreed to follow-up with our clinic in 4 weeks. He was informed of the importance of frequent follow-up visits to maximize his success with intensive lifestyle modifications for his multiple health conditions.   Dickson was informed we would discuss his lab results at his next visit unless there is a critical issue that needs to be addressed sooner. Gianny agreed to keep his next visit at the agreed upon time to discuss these results.  Objective:   Blood pressure 108/65, pulse 69, temperature 97.7 F (36.5 C), height 5\' 10"  (1.778 m), weight 223 lb (101.2 kg), SpO2 99 %. Body mass index is 32 kg/m.  General: Cooperative, alert, well developed, in no acute distress. HEENT: Conjunctivae and lids unremarkable. Cardiovascular: Regular rhythm.  Lungs: Normal work of breathing. Neurologic: No focal deficits.   Lab Results  Component Value Date   CREATININE 0.95 01/28/2021   BUN 23 01/28/2021   NA 141 01/28/2021   K 4.2 01/28/2021   CL 103 01/28/2021   CO2 22 01/28/2021   Lab Results  Component Value Date   ALT 15 01/28/2021   AST 12 01/28/2021   ALKPHOS 43 (L) 01/28/2021   BILITOT 0.3 01/28/2021   Lab Results  Component Value Date   HGBA1C 5.3 12/02/2020   HGBA1C 5.9 (H) 08/29/2020   HGBA1C 5.7 (H) 05/09/2020   HGBA1C 5.8 (H) 12/12/2019   HGBA1C 6.1 (H) 08/22/2019   Lab Results  Component Value Date   INSULIN 5.8 08/29/2020   INSULIN 5.3 05/09/2020   Lab Results  Component Value Date   TSH 1.25 02/05/2020   Lab Results  Component Value Date   CHOL 129 01/28/2021   HDL 48 01/28/2021   LDLCALC 66 01/28/2021   LDLDIRECT 112.0 07/31/2016   TRIG 76 01/28/2021   CHOLHDL 2.7 01/28/2021   Lab Results   Component Value Date   WBC 6.4 12/02/2020   HGB 12.2 (L) 12/02/2020   HCT 38.3 (L) 12/02/2020   MCV 88.5 12/02/2020   PLT 271 12/02/2020   No results found for: IRON, TIBC, FERRITIN  Obesity Behavioral Intervention:   Approximately 15 minutes were spent on the discussion below.  ASK: We discussed the diagnosis of obesity with [name] today and Damarius agreed to give [name] permission to discuss obesity behavioral modification therapy today.  ASSESS: Francois has the diagnosis of obesity and his BMI today is 32. Oran is in the action stage of change.   ADVISE: Rivaldo was educated on the multiple health risks of obesity as well as the benefit of weight loss to improve his health. He was advised of the need for long term treatment and the importance of lifestyle modifications to improve his current health and to decrease his risk of future health problems.  AGREE: Multiple dietary modification options and treatment options were discussed and Zyen agreed to follow the recommendations documented in the above note.  ARRANGE: Kolbey was educated on the importance of frequent visits to treat obesity as outlined per CMS and USPSTF guidelines and agreed to schedule  his next follow up appointment today.  Attestation Statements:   Reviewed by clinician on day of visit: allergies, medications, problem list, medical history, surgical history, family history, social history, and previous encounter notes.   Wilhemena Durie, am acting as transcriptionist for [employer], PA-C.  I have reviewed the above documentation for accuracy and completeness, and I agree with the above. Abby Potash, PA-C

## 2021-02-04 ENCOUNTER — Encounter (INDEPENDENT_AMBULATORY_CARE_PROVIDER_SITE_OTHER): Payer: Medicare Other | Admitting: Ophthalmology

## 2021-02-10 ENCOUNTER — Other Ambulatory Visit: Payer: Self-pay | Admitting: Family Medicine

## 2021-02-12 ENCOUNTER — Encounter: Payer: Self-pay | Admitting: Adult Health

## 2021-02-12 ENCOUNTER — Ambulatory Visit (INDEPENDENT_AMBULATORY_CARE_PROVIDER_SITE_OTHER): Payer: Medicare Other | Admitting: Adult Health

## 2021-02-12 VITALS — BP 123/70 | HR 89 | Ht 70.0 in | Wt 220.0 lb

## 2021-02-12 DIAGNOSIS — Z9989 Dependence on other enabling machines and devices: Secondary | ICD-10-CM

## 2021-02-12 DIAGNOSIS — G4733 Obstructive sleep apnea (adult) (pediatric): Secondary | ICD-10-CM

## 2021-02-12 NOTE — Progress Notes (Signed)
PATIENT: [name] [name] DOB: November 23, 1951  REASON FOR VISIT: follow up HISTORY FRO   M: patient  HISTORY OF PRESENT ILLNESS: Today 02/12/21: [name] [name] is a 70 year old male with a history of obstructive sleep apnea on CPAP.  He returns today for follow-up.  He states that he feels that something is wrong with his machine.  He states that initially the machine worked well but now he feels that there is hardly any air blowing.  He states that there also times the machine completely cuts off on him.  He has not called his DME company.    02/12/20: [name] [name] is a 70 year old male with a history of obstructive sleep apnea on CPAP.  His download indicates that he uses machine nightly for compliance of 100%.  He uses machine greater than 4 hours 26 out of 30 days for compliance of 87%.  His residual AHI is 1.3 on 16 cm of water with EPR 2.  Leak in the 95th percentile is 22.4 L/min.  He reports the machine is working well for him.  He does state that there are some nights he gets up to go the bathroom and when he puts the machine back on it feels as if there is no air blowing.  He has not discussed this with his DME company.   HISTORY 02/09/2019: I reviewed his CPAP compliance data from 01/07/2019 through 02/05/2019 which is a total of 30 days, during which time he used his CPAP every night with percent used days greater than 4 hours at 97%, indicating excellent compliance with an average usage of 5 hours and 31 minutes, residual AHI at goal at 0.7 per hour, leaked very low with the 95th percentile at 0 L/m on a pressure of 16 cm with EPR of 2. He reportsdoing well, pressure at 16 is working well for him. We reduced it from 17 last time. He has turned off the ramp time which is better for him. Sometimes he sleeps an hour so without the mask on when he feels like he needs a break. Generally, he is able to sleep very well for the first 3 hours and then he has sleep disruption, has to go to  the bathroom more than once for the rest of the night. He likes the cooler temperature and not too much moisture. He has adjusted the settings to his liking. He uses a fullface mask and tries to sleep on his sides. He is working on weight loss, goes to the weight management clinic, wife has been able to lose quite a bit of weight as well. She also has sleep apnea. Because of her sleep apnea mask leaking, he gets disturbed sometimes, they typically sleep in separate bedrooms.    REVIEW OF SYSTEMS: Out of a complete 14 system review of symptoms, the patient complains only of the following symptoms, and all other reviewed systems are negative.  ESS10  ALLERGIES: Allergies  Allergen Reactions  . Simvastatin Other (See Comments)    myalgias  . Antihistamines, Diphenhydramine-Type Other (See Comments)    depression  . Codeine Nausea Only  . Penicillins Hives  . Sulfa Antibiotics Hives    HOME MEDICATIONS: Outpatient Medications Prior to Visit  Medication Sig Dispense Refill  . aspirin 81 MG EC tablet Take 1 tablet (81 mg total) by mouth daily. 30 tablet 12  . atorvastatin (LIPITOR) 80 MG tablet TAKE 1 TABLET(80 MG) BY MOUTH DAILY 90 tablet 0  . azelastine (ASTELIN) 0.1 %  nasal spray Place 2 sprays into both nostrils 2 (two) times daily. Use in each nostril as directed 30 mL 11  . buPROPion (WELLBUTRIN SR) 150 MG 12 hr tablet Take 1 tablet (150 mg total) by mouth 2 (two) times daily. 180 tablet 0  . Choline Fenofibrate (FENOFIBRIC ACID) 135 MG CPDR TAKE 1 CAPSULE BY MOUTH DAILY 90 capsule 0  . escitalopram (LEXAPRO) 20 MG tablet TAKE 1 TABLET(20 MG) BY MOUTH DAILY 90 tablet 0  . finasteride (PROSCAR) 5 MG tablet TAKE 1 TABLET(5 MG) BY MOUTH DAILY 90 tablet 0  . fluticasone (FLONASE) 50 MCG/ACT nasal spray Place 2 sprays into both nostrils daily. (Patient taking differently: Place 2 sprays into both nostrils daily as needed for allergies.) 48 g 1  . gabapentin (NEURONTIN) 300 MG capsule Take  900 mg by mouth at bedtime.    [name] glucose blood (ONE TOUCH ULTRA TEST) test strip 1 each by Other route 3 (three) times daily. Test sugars up to three times daily. ( insurance preference  DX E11.65 E11.9) 100 each 11  . glucose blood test strip Per Insurance Preference- Test sugars up to three times daily DX E11.9 100 each 11  . HYDROcodone-acetaminophen (NORCO/VICODIN) 5-325 MG tablet Take 1 tablet by mouth every 6 (six) hours as needed.    . insulin detemir (LEVEMIR FLEXTOUCH) 100 UNIT/ML FlexPen Inject 56 Units into the skin every evening. ADMINISTER 56 UNITS UNDER THE SKIN EVERY DAY 15 mL 6  . Insulin Pen Needle (BD PEN NEEDLE NANO 2ND GEN) 32G X 4 MM MISC USE TO INJECT 4 TIMES DAILY 100 each 2  . Krill Oil 500 MG CAPS Take 500 mg by mouth daily.    . metFORMIN (GLUCOPHAGE) 1000 MG tablet TAKE 1 TABLET BY MOUTH TWICE DAILY WITH A MEAL 180 tablet 1  . methocarbamol (ROBAXIN) 500 MG tablet Take 500 mg by mouth 4 (four) times daily.    [name] OVER THE COUNTER MEDICATION Take 1 capsule by mouth at bedtime. Primal Labs Sleep Refined Supplement  Melatonin 4 mg + supplements    . Semaglutide, 1 MG/DOSE, (OZEMPIC, 1 MG/DOSE,) 2 MG/1.5ML SOPN Inject 1 mg into the skin once a week. 18 mL 0  . sodium chloride (OCEAN) 0.65 % nasal spray Place 1 spray into the nose daily as needed.    . tamsulosin (FLOMAX) 0.4 MG CAPS capsule TAKE 2 CAPSULES(0.8 MG) BY MOUTH AT BEDTIME 180 capsule 0  . vitamin B-12 (CYANOCOBALAMIN) 1000 MCG tablet Take 2,000 mcg by mouth daily.    . Vitamin D, Ergocalciferol, (DRISDOL) 1.25 MG (50000 UNIT) CAPS capsule Take 1 capsule (50,000 Units total) by mouth every 7 (seven) days. 12 capsule 0   No facility-administered medications prior to visit.    PAST MEDICAL HISTORY: Past Medical History:  Diagnosis Date  . Allergic rhinitis, unspecified    Allergy testing via allergist 04/2017--mostly neg.  Dr. Donneta Romberg recommended referral to ENT so i did this.  . Back pain    LBP->DDD/spondylosis  w/ intermittent radiulopathy-type leg pains, + bilat hip and thigh pain-->spinal stenosis L4-5, L5-S1->Dr. Vertell Limber to do decomp and fusion surgery, scheduled for 12/03/20  . BPH (benign prostatic hypertrophy)    no hydro on renal u/s 05/2020  . Chronic nasal congestion   . DDD (degenerative disc disease), lumbar 2010   laminectomy 01/2009.  MR rpt 05/2019->advanced DDD/spondylosis L3-S1, with mod/sev L4-5 spinal stenosis-->plan for surg per Dr. Vertell Limber as of 09/2020  . Depression    Hospitalized for suicidal  ideation 1992.  Has been on lexapro since 10/2008.  . Diabetes mellitus type II    Dx'd approximately 06/2008.  No retinopathy as of 02/2017 ophth.  . Erectile dysfunction   . Heart murmur, systolic 67/2094   mild intensity, asymptomatic; echo 01/2018 valves fine->obs  . History of colon polyps 01/2016   Recall 3 yrs  . History of pneumonia 2008   Hospitalized  . History of scarlet fever    age 35  . Hyperlipidemia, mixed 1993   myalgias on pravastatin  . Hypertension 2009   "marked chronic cardiomegaly" on CXR 01/2009 per old records.  . 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 Insomnia   . Keratoconus    dx'd 1973  . Memory changes   . Mild cognitive impairment with memory loss 01/2020 eval with Dr. Delice Lesch   MRI brain 03/01/20 NORMAL  . OSA on CPAP 2004; 2019   Back on CPAP 04/2018.  Compliance great as of 01/2020 neuro/sleep f/u  . Osteoarthritis of both knees    Mild plain film changes in medial compartment bilat  . Restless leg    gabapentin helpful  . Rhinitis, chronic   . 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 APPENDECTOMY  1972  . COLONOSCOPY  X 4   Most recent was 2007 [place], [place]), with removal of polyps in the first two.  02/21/16: tubular adenoma.  Recall 3 yrs (Dr. Ardis Hughs).  . 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  ELECTROCARDIOGRAM  01/29/2009   NORMAL  . LUMBAR LAMINECTOMY  01/2009  . TONSILLECTOMY AND ADENOIDECTOMY     age 8  . TRANSTHORACIC ECHOCARDIOGRAM  02/22/2018   EF 55-60%, grd I DD  . URETHRAL DILATION     2 as a child, one as an adult  . VASECTOMY  1994  . VITRECTOMY      FAMILY HISTORY: Family History  Problem Relation Age of Onset  . Heart disease Mother   . Hyperlipidemia Mother   . Stroke Mother   . Diabetes Mother   . Depression Mother   . Alzheimer's disease Mother   . Stroke Father   . Hyperlipidemia Father   . Heart disease Father   . Diabetes Father   . Hypertension Father   . Obesity Father   . Alcohol abuse Sister   . Depression Sister     SOCIAL HISTORY: Social History   Socioeconomic History  . Marital status: Married    Spouse name: Demani Mcbrien  . Number of children: Not on file  . Years of education: Not on file  . Highest education level: Not on file  Occupational History  . Occupation: Retired  Tobacco Use  . Smoking status: Former Research scientist (life sciences)  . Smokeless tobacco: Never Used  Vaping Use  . Vaping Use: Never used  Substance and Sexual Activity  . Alcohol use: Yes    Comment: social, 3-4 drinks month  . Drug use: No  . Sexual activity: Yes    Partners: Female  Other Topics Concern  . Not on file  Social History Narrative   Divorced, then remarried.  Has 3 sons, no grandchildren.   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   No tobacco.  Rare ETOH.  No  drug abuse.   Enjoys reading and spending time with his 2 dogs.   Right handed    Social Determinants of Health   Financial Resource Strain: Not on file  Food Insecurity: Not on file  Transportation Needs: Not on file  Physical Activity: Not on file  Stress: Not on file  Social Connections: Not on file  Intimate Partner Violence: Not on file      PHYSICAL EXAM  Vitals:   02/12/21 1254  BP: 123/70  Pulse: 89   Weight: 220 lb (99.8 kg)  Height: 5\' 10"  (1.778 m)   Body mass index is 31.57 kg/m.  Generalized: Well developed, in no acute distress  Chest: Lungs clear to auscultation bilaterally  Neurological examination  Mentation: Alert oriented to time, place, history taking. Follows all commands speech and language fluent Cranial nerve II-XII: Extraocular movements were full, visual field were full on confrontational test Head turning and shoulder shrug  were normal and symmetric. Motor: The motor testing reveals 5 over 5 strength of all 4 extremities. Good symmetric motor tone is noted throughout.  Sensory: Sensory testing is intact to soft touch on all 4 extremities. No evidence of extinction is noted.  Gait and station: Gait is normal.    DIAGNOSTIC DATA (LABS, IMAGING, TESTING) - I reviewed patient records, labs, notes, testing and imaging myself where available.  Lab Results  Component Value Date   WBC 6.4 12/02/2020   HGB 12.2 (L) 12/02/2020   HCT 38.3 (L) 12/02/2020   MCV 88.5 12/02/2020   PLT 271 12/02/2020      Component Value Date/Time   NA 141 01/28/2021 1420   K 4.2 01/28/2021 1420   CL 103 01/28/2021 1420   CO2 22 01/28/2021 1420   GLUCOSE 70 01/28/2021 1420   GLUCOSE 90 12/02/2020 0913   BUN 23 01/28/2021 1420   CREATININE 0.95 01/28/2021 1420   CREATININE 0.94 01/18/2017 0832   CALCIUM 9.5 01/28/2021 1420   PROT 6.3 01/28/2021 1420   ALBUMIN 4.3 01/28/2021 1420   AST 12 01/28/2021 1420   ALT 15 01/28/2021 1420   ALKPHOS 43 (L) 01/28/2021 1420   BILITOT 0.3 01/28/2021 1420   GFRNONAA >60 12/02/2020 0913   GFRAA 88 08/29/2020 0943   Lab Results  Component Value Date   CHOL 129 01/28/2021   HDL 48 01/28/2021   LDLCALC 66 01/28/2021   LDLDIRECT 112.0 07/31/2016   TRIG 76 01/28/2021   CHOLHDL 2.7 01/28/2021   Lab Results  Component Value Date   HGBA1C 5.3 12/02/2020   Lab Results  Component Value Date   VITAMINB12 1,159 (H) 02/05/2020   Lab  Results  Component Value Date   TSH 1.25 02/05/2020      ASSESSMENT AND PLAN 70 y.o. year old male  has a past medical history of Allergic rhinitis, unspecified, Back pain, BPH (benign prostatic hypertrophy), Chronic nasal congestion, DDD (degenerative disc disease), lumbar (2010), Depression, Diabetes mellitus type II, Erectile dysfunction, Heart murmur, systolic (08/6268), History of colon polyps (01/2016), History of pneumonia (2008), History of scarlet fever, Hyperlipidemia, mixed (1993), Hypertension (2009), Hypogonadism, male (01/11/2012), Insomnia, Keratoconus, Memory changes, Mild cognitive impairment with memory loss (01/2020 eval with Dr. Delice Lesch), OSA on CPAP (2004; 2019), Osteoarthritis of both knees, Restless leg, Rhinitis, chronic, and Urethral stricture. here with:  1. OSA on CPAP  - CPAP compliance excellent - Good treatment of AHI  - Encourage patient to use CPAP nightly and > 4 hours each night -Order sent to his DME  company to service his machine. - F/U in 1 year or sooner if needed   I spent 20 minutes of face-to-face and non-face-to-face time with patient.  This included previsit chart review, lab review, study review, order entry, electronic health record documentation, patient education.  Ward Givens, MSN, NP-C 02/12/2021, 12:42 PM [facility] [address] [place], [place] [address] [phone]

## 2021-02-12 NOTE — Patient Instructions (Signed)
Continue using CPAP nightly and greater than 4 hours each night Have DME service machine If your symptoms worsen or you develop new symptoms please let us know.

## 2021-02-25 ENCOUNTER — Encounter (INDEPENDENT_AMBULATORY_CARE_PROVIDER_SITE_OTHER): Payer: Self-pay | Admitting: Physician Assistant

## 2021-02-25 ENCOUNTER — Other Ambulatory Visit: Payer: Self-pay

## 2021-02-25 ENCOUNTER — Ambulatory Visit (INDEPENDENT_AMBULATORY_CARE_PROVIDER_SITE_OTHER): Payer: Medicare Other | Admitting: Physician Assistant

## 2021-02-25 VITALS — BP 116/64 | HR 79 | Temp 97.8°F | Ht 70.0 in | Wt 228.0 lb

## 2021-02-25 DIAGNOSIS — I152 Hypertension secondary to endocrine disorders: Secondary | ICD-10-CM | POA: Diagnosis not present

## 2021-02-25 DIAGNOSIS — E669 Obesity, unspecified: Secondary | ICD-10-CM

## 2021-02-25 DIAGNOSIS — Z6834 Body mass index (BMI) 34.0-34.9, adult: Secondary | ICD-10-CM

## 2021-02-25 DIAGNOSIS — E1169 Type 2 diabetes mellitus with other specified complication: Secondary | ICD-10-CM | POA: Diagnosis not present

## 2021-02-25 DIAGNOSIS — Z794 Long term (current) use of insulin: Secondary | ICD-10-CM

## 2021-02-25 DIAGNOSIS — E1159 Type 2 diabetes mellitus with other circulatory complications: Secondary | ICD-10-CM | POA: Diagnosis not present

## 2021-02-25 MED ORDER — OZEMPIC (1 MG/DOSE) 2 MG/1.5ML ~~LOC~~ SOPN: 1.0000 mg | PEN_INJECTOR | SUBCUTANEOUS | 0 refills | Status: DC

## 2021-03-03 NOTE — Progress Notes (Signed)
Chief Complaint:   OBESITY [name] [name] is here to discuss his progress with his obesity treatment plan along with follow-up of his obesity related diagnoses. [name] [name] is on keeping a food journal and adhering to recommended goals of 1800 calories and 125 grams of protein daily and states he is following his eating plan approximately 60% of the time. [name] [name] states he is walking for 10-15 minutes 2 times per week.  Today's visit was #: 88 Starting weight: 273 lbs Starting date: 02/14/2018 Today's weight: 228 lbs Today's date: 02/25/2021 Total lbs lost to date: 45 Total lbs lost since last in-office visit: 0  Interim History: [name] [name] continues to have back pain since his surgery. He restarts physical therapy in 1-2 weeks. He also reports that he has been eating out more often and he is "tired of dieting."  He is not journaling consistently.  Subjective:   1. Type 2 diabetes mellitus with other specified complication, with long-term current use of insulin (HCC) [name] [name]'s fasting BGs averages between 80 and 90's. Last A1c was 5.3. No hypoglycemia per the patient.  2. Hypertension associated with diabetes ([place]) [name] [name]'s blood pressure is controlled, and he denies chest pain or headache. He is managed by his primary care physician.  Assessment/Plan:   1. Type 2 diabetes mellitus with other specified complication, with long-term current use of insulin (HCC) Good blood sugar control is important to decrease the likelihood of diabetic complications such as nephropathy, neuropathy, limb loss, blindness, coronary artery disease, and death. Intensive lifestyle modification including diet, exercise and weight loss are the first line of treatment for diabetes. We will refill Ozempic for 1 month, and [name] [name] will speak to his primary care in regards to his A1c of 5.3.  - Semaglutide, 1 MG/DOSE, (OZEMPIC, 1 MG/DOSE,) 2 MG/1.5ML SOPN; Inject 1 mg into the skin once a week.  Dispense: 18 mL; Refill: 0  2.  Hypertension associated with diabetes ([name] [name]) [name] [name] will follow up with his primary care physician, and will continue his medications and meal plan. He will continue working on healthy weight loss and exercise to improve blood pressure control. We will watch for signs of hypotension as he continues his lifestyle modifications.  3. Class 1 obesity with serious comorbidity and body mass index (BMI) of 34.0 to 34.9 in adult, unspecified obesity type [name] [name] is currently in the action stage of change. As such, his goal is to continue with weight loss efforts. He has agreed to keeping a food journal and adhering to recommended goals of 1800 calories and 125 grams of protein daily  Exercise goals: As is.  Behavioral modification strategies: meal planning and cooking strategies and planning for success.  [name] [name] has agreed to follow-up with our clinic in 4 weeks. He was informed of the importance of frequent follow-up visits to maximize his success with intensive lifestyle modifications for his multiple health conditions.   Objective:   Blood pressure 116/64, pulse 79, temperature 97.8 F (36.6 C), height 5\' 10"  (1.778 m), weight 228 lb (103.4 kg), SpO2 97 %. Body mass index is 32.71 kg/m.  General: Cooperative, alert, well developed, in no acute distress. HEENT: Conjunctivae and lids unremarkable. Cardiovascular: Regular rhythm.  Lungs: Normal work of breathing. Neurologic: No focal deficits.   Lab Results  Component Value Date   CREATININE 0.95 01/28/2021   BUN 23 01/28/2021   NA 141 01/28/2021   K 4.2 01/28/2021   CL 103 01/28/2021   CO2 22 01/28/2021   Lab Results  Component  Value Date   ALT 15 01/28/2021   AST 12 01/28/2021   ALKPHOS 43 (L) 01/28/2021   BILITOT 0.3 01/28/2021   Lab Results  Component Value Date   HGBA1C 5.3 12/02/2020   HGBA1C 5.9 (H) 08/29/2020   HGBA1C 5.7 (H) 05/09/2020   HGBA1C 5.8 (H) 12/12/2019   HGBA1C 6.1 (H) 08/22/2019   Lab Results  Component  Value Date   INSULIN 5.8 08/29/2020   INSULIN 5.3 05/09/2020   Lab Results  Component Value Date   TSH 1.25 02/05/2020   Lab Results  Component Value Date   CHOL 129 01/28/2021   HDL 48 01/28/2021   LDLCALC 66 01/28/2021   LDLDIRECT 112.0 07/31/2016   TRIG 76 01/28/2021   CHOLHDL 2.7 01/28/2021   Lab Results  Component Value Date   WBC 6.4 12/02/2020   HGB 12.2 (L) 12/02/2020   HCT 38.3 (L) 12/02/2020   MCV 88.5 12/02/2020   PLT 271 12/02/2020   No results found for: IRON, TIBC, FERRITIN  Obesity Behavioral Intervention:   Approximately 15 minutes were spent on the discussion below.  ASK: We discussed the diagnosis of obesity with [name] [name] today and [name] [name] agreed to give [name] permission to discuss obesity behavioral modification therapy today.  ASSESS: [name] [name] has the diagnosis of obesity and his BMI today is 32.71. [name] [name] is in the action stage of change.   ADVISE: [name] [name] was educated on the multiple health risks of obesity as well as the benefit of weight loss to improve his health. He was advised of the need for long term treatment and the importance of lifestyle modifications to improve his current health and to decrease his risk of future health problems.  AGREE: Multiple dietary modification options and treatment options were discussed and [name] [name] agreed to follow the recommendations documented in the above note.  ARRANGE: [name] [name] was educated on the importance of frequent visits to treat obesity as outlined per CMS and USPSTF guidelines and agreed to schedule his next follow up appointment today.  Attestation Statements:   Reviewed by clinician on day of visit: allergies, medications, problem list, medical history, surgical history, family history, social history, and previous encounter notes.   Wilhemena Durie, am acting as transcriptionist for [employer], PA-C.  I have reviewed the above documentation for accuracy and completeness, and I agree with the  above. Abby Potash, PA-C

## 2021-03-10 ENCOUNTER — Other Ambulatory Visit: Payer: Self-pay

## 2021-03-10 ENCOUNTER — Ambulatory Visit (HOSPITAL_COMMUNITY): Payer: Medicare Other | Attending: Neurosurgery | Admitting: Physical Therapy

## 2021-03-10 ENCOUNTER — Encounter (HOSPITAL_COMMUNITY): Payer: Self-pay | Admitting: Physical Therapy

## 2021-03-10 DIAGNOSIS — M6281 Muscle weakness (generalized): Secondary | ICD-10-CM

## 2021-03-10 DIAGNOSIS — G8929 Other chronic pain: Secondary | ICD-10-CM | POA: Diagnosis not present

## 2021-03-10 DIAGNOSIS — M5442 Lumbago with sciatica, left side: Secondary | ICD-10-CM | POA: Insufficient documentation

## 2021-03-10 DIAGNOSIS — M5441 Lumbago with sciatica, right side: Secondary | ICD-10-CM | POA: Insufficient documentation

## 2021-03-10 DIAGNOSIS — M545 Low back pain, unspecified: Secondary | ICD-10-CM | POA: Insufficient documentation

## 2021-03-10 NOTE — Therapy (Signed)
[place], [place], [address] Phone: [phone]   Fax:  [phone]  Physical Therapy Evaluation  Patient Details  Name: [name] [name] MRN: [id number] Date of Birth: [birth year] Referring Provider (PT): Erline Levine MD   Encounter Date: 03/10/2021   PT End of Session - 03/10/21 1026    Visit Number 1    Number of Visits 8    Date for PT Re-Evaluation 04/07/21    Authorization Type Medicare A/ BCBS supplement 2nd (no auth)    PT Start Time [id number]    PT Stop Time 1030    PT Time Calculation (min) 44 min    Activity Tolerance Patient tolerated treatment well    Behavior During Therapy [facility] for tasks assessed/performed           Past Medical History:  Diagnosis Date  . Allergic rhinitis, unspecified    Allergy testing via allergist 04/2017--mostly neg.  Dr. Donneta Romberg recommended referral to ENT so i did this.  . Back pain    LBP->DDD/spondylosis w/ intermittent radiulopathy-type leg pains, + bilat hip and thigh pain-->spinal stenosis L4-5, L5-S1->Dr. Vertell Limber to do decomp and fusion surgery, scheduled for 12/03/20  . BPH (benign prostatic hypertrophy)    no hydro on renal u/s 05/2020  . Chronic nasal congestion   . DDD (degenerative disc disease), lumbar 2010   laminectomy 01/2009.  MR rpt 05/2019->advanced DDD/spondylosis L3-S1, with mod/sev L4-5 spinal stenosis-->plan for surg per Dr. Vertell Limber as of 09/2020  . Depression    Hospitalized for suicidal ideation 61.  Has been on lexapro since 10/2008.  . Diabetes mellitus type II    Dx'd approximately 06/2008.  No retinopathy as of 02/2017 ophth.  . Erectile dysfunction   . Heart murmur, systolic 95/2841   mild intensity, asymptomatic; echo 01/2018 valves fine->obs  . History of colon polyps 01/2016   Recall 3 yrs  . History of pneumonia 2008   Hospitalized  . History of scarlet fever    age 53  . Hyperlipidemia, mixed 1993   myalgias on pravastatin  . Hypertension  2009   "marked chronic cardiomegaly" on CXR 01/2009 per old records.  . 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 Insomnia   . Keratoconus    dx'd 1973  . Memory changes   . Mild cognitive impairment with memory loss 01/2020 eval with Dr. Delice Lesch   MRI brain 03/01/20 NORMAL  . OSA on CPAP 2004; 2019   Back on CPAP 04/2018.  Compliance great as of 01/2020 neuro/sleep f/u  . Osteoarthritis of both knees    Mild plain film changes in medial compartment bilat  . Restless leg    gabapentin helpful  . Rhinitis, chronic   . 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 APPENDECTOMY  1972  . COLONOSCOPY  X 4   Most recent was 2007 [place], [place]), with removal of polyps in the first two.  02/21/16: tubular adenoma.  Recall 3 yrs (Dr. Ardis Hughs).  . 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 ELECTROCARDIOGRAM  01/29/2009   NORMAL  . LUMBAR LAMINECTOMY  01/2009  . POSTERIOR LUMBAR FUSION  12/03/2020   L4-5, L5-S1 (Dr. Vertell Limber)  . TONSILLECTOMY AND ADENOIDECTOMY     age 32  . TRANSTHORACIC ECHOCARDIOGRAM  02/22/2018   EF 55-60%, grd I  DD  . URETHRAL DILATION     2 as a child, one as an adult  . VASECTOMY  1994  . VITRECTOMY      There were no vitals filed for this visit.    Subjective Assessment - 03/10/21 0953    Subjective Patient presents to physical therapy with complaint of low back pain. He had lumbar surgery in January. He says he was having radiating pain prior to surgery which is much better now. He is still has occasional sharp pains near SI joint, it happens on both sides. He feels his core muscles are weak and his back is stiff. He would also like to work on his balance.    Pertinent History Lumbar fusion 12/03/20    Limitations Lifting;Standing;Walking;House hold activities    How long can you stand comfortably? 15 minutes    How long can you walk comfortably? 15 minutes     Patient Stated Goals Get rid of pain, walk the dogs more, walk longer, improve balance    Currently in Pain? Yes    Pain Score 1     Pain Location Back    Pain Orientation Right;Left;Posterior;Lower    Pain Descriptors / Indicators Sharp;Dull    Pain Type Acute pain    Pain Onset Other (comment)    Pain Frequency Intermittent    Aggravating Factors  walking, bending, lifting    Pain Relieving Factors stretching, hot shower    Effect of Pain on Daily Activities LImits              [facility] PT Assessment - 03/10/21 0001      Assessment   Medical Diagnosis LBP    Referring Provider (PT) Erline Levine MD    Onset Date/Surgical Date 12/03/20    Prior Therapy No      Precautions   Precautions None      Restrictions   Weight Bearing Restrictions No      Balance Screen   Has the patient fallen in the past 6 months Yes    How many times? 2    Has the patient had a decrease in activity level because of a fear of falling?  Yes    Is the patient reluctant to leave their home because of a fear of falling?  No      Home Ecologist residence    Living Arrangements Spouse/significant other      Prior Function   Level of Independence Independent      Cognition   Overall Cognitive Status Within Functional Limits for tasks assessed      Observation/Other Assessments   Focus on Therapeutic Outcomes (FOTO)  52% function      ROM / Strength   AROM / PROM / Strength AROM;Strength      AROM   AROM Assessment Site Lumbar    Lumbar Flexion 25% limited    Lumbar Extension 50% limited    Lumbar - Right Side Bend WNL    Lumbar - Left Side Bend 25% limited      Strength   Strength Assessment Site Hip;Knee;Ankle    Right/Left Hip Right;Left    Right Hip Flexion 4+/5    Right Hip Extension 4/5    Right Hip ABduction 4/5    Left Hip Flexion 4/5    Left Hip Extension 3+/5    Left Hip ABduction 3+/5    Right/Left Knee Right;Left    Right Knee Extension  4+/5  Left Knee Extension 4/5    Right/Left Ankle Right;Left    Right Ankle Dorsiflexion 4+/5    Left Ankle Dorsiflexion 4/5      Palpation   Palpation comment Min/ mod TTP about bilateral SI joint      Balance   Balance Assessed Yes      Static Standing Balance   Static Standing Balance -  Activities  Tandam Stance - Right Leg;Tandam Stance - Left Leg    Static Standing - Comment/# of Minutes 30 sec mod sway, 30 sec min sway                      Objective measurements completed on examination: See above findings.       [place] Adult PT Treatment/Exercise - 03/10/21 0001      Exercises   Exercises Lumbar      Lumbar Exercises: Supine   Ab Set 5 reps    Bridge 5 reps    Other Supine Lumbar Exercises iso hip abd/ add 5 x 5"                  PT Education - 03/10/21 0957    Education Details on evaluation findings, POC and HEP    Person(s) Educated Patient    Methods Explanation;Handout    Comprehension Verbalized understanding            PT Short Term Goals - 03/10/21 1153      PT SHORT TERM GOAL #1   Title Patient will be independent with initial HEP and self-management strategies to improve functional outcomes    Time 2    Period Weeks    Status New    Target Date 03/24/21             PT Long Term Goals - 03/10/21 1154      PT LONG TERM GOAL #1   Title Patient will have equal to or > 4+/5 MMT throughout BLE to improve ability to perform functional mobility, stair ambulation and ADLs.    Time 4    Period Weeks    Status New    Target Date 04/07/21      PT LONG TERM GOAL #2   Title Patient will report at least 75% overall improvement in subjective complaint to indicate improvement in ability to perform ADLs.    Time 4    Period Weeks    Status New    Target Date 04/07/21      PT LONG TERM GOAL #3   Title Patient will be able to maintain tandem stance >30 seconds on foam bilaterally to show improved stability and ability to  ambulate outdoors    Time 4    Period Weeks    Status New    Target Date 04/07/21      PT LONG TERM GOAL #4   Title Patient will be able to walk >30 minutes with no increased back pain for walking dog and exercising outdoors.    Time 4    Period Weeks    Status New    Target Date 04/07/21                  Plan - 03/10/21 1139    Clinical Impression Statement Patient is a 70 y.o. male who presents to physical therapy with complaint of LBP. Patient demonstrates decreased strength, ROM restriction, balance deficits and increased tenderness to palpation which are likely contributing to symptoms of pain and are  negatively impacting patient ability to perform ADLs and functional mobility tasks. Patient will benefit from skilled physical therapy services to address these deficits to reduce pain, improve level of function with ADLs, functional mobility tasks, and reduce risk for falls.    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ery Low    Rehab Potential Good    PT Frequency 2x / week    PT Duration 4 weeks    PT Treatment/Interventions ADLs/Self Care Home Management;Aquatic Therapy;Canalith Repostioning;Cryotherapy;Electrical Stimulation;Contrast Bath;Therapeutic exercise;Patient/family education;Orthotic Fit/Training;Manual lymph drainage;Compression bandaging;Taping;Vasopneumatic Device;Joint Manipulations;Splinting;Energy conservation;Spinal Manipulations;Visual/perceptual remediation/compensation;Passive range of motion;Dry needling;Manual techniques;Scar mobilization;Vestibular;Balance training;Neuromuscular re-education;DME Instruction;Gait training;Moist Heat;Traction;Stair training;Functional  mobility training;Therapeutic activities;Ultrasound;Parrafin;Fluidtherapy;Biofeedback;Iontophoresis 4mg /ml Dexamethasone    PT Next Visit Plan Review HEP. Progress core and glute strength as tolerated. Add bent knee marc, SLR, progress to standing hip abd, sit to stand/ squat, heel raises and standing balance    PT Home Exercise Plan Eval: ab set, hip abd/ add iso, bridge    Consulted and Agree with Plan of Care Patient           Patient will benefit from skilled therapeutic intervention in order to improve the following deficits and impairments:  Pain,Improper body mechanics,Increased fascial restricitons,Abnormal gait,Decreased balance,Impaired flexibility,Decreased activity tolerance,Decreased range of motion,Decreased strength,Decreased mobility,Postural dysfunction  Visit Diagnosis: Low back pain, unspecified back pain laterality, unspecified chronicity, unspecified whether sciatica present  Muscle weakness (generalized)     Problem List Patient Active Problem List   Diagnosis Date Noted  . 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  . Vitamin D deficiency 04/16/2020  . Depression 04/16/2020  . Class 1 obesity with serious comorbidity and body mass index (BMI) of 33.0 to 33.9 in adult 04/16/2020  . Other fatigue 02/14/2018  . Shortness of breath on exertion 02/14/2018  . Type 2 diabetes mellitus with hyperglycemia, with long-term current use of insulin ([place]) 02/14/2018  . Abnormal EKG 02/14/2018  . Maxillary sinusitis, acute 12/17/2015  . Cough 12/17/2015  . Fever 12/17/2015  . Diabetes mellitus without complication ([place]) 77/82/4235  . Tooth pain 06/21/2013  . Orthostatic lightheadedness 05/17/2013  . Health maintenance examination 05/17/2013  . Groin strain 11/10/2012  . Hypogonadism, male 01/11/2012  . Type II or unspecified type diabetes mellitus without mention of complication, not stated as uncontrolled 07/08/2011  . HTN (hypertension), benign 07/08/2011  .  Hyperlipidemia 07/08/2011  . BPH (benign prostatic hypertrophy) 07/08/2011  . Hx of adenomatous colonic polyps 07/08/2011   12:00 PM, 03/10/21 Josue Hector PT DPT  Physical Therapist with [facility]  [phone] [place] [address] [place], [place], [address] Phone: [phone]   Fax:  [phone]  Name: [name] [name] MRN: [id number] Date of Birth: [birth year]

## 2021-03-10 NOTE — Patient Instructions (Signed)
Access Code: [email or web address] URL: [email or web address] Date: 03/10/2021 Prepared by: Josue Hector  Exercises Supine Transversus Abdominis Bracing - Hands on Stomach - 3 x daily - 7 x weekly - 1 sets - 10 reps - 5 seconds hold Hooklying Isometric Hip Abduction with Belt - 3 x daily - 7 x weekly - 1 sets - 10 reps - 5 seconds hold Supine Hip Adduction Isometric with Ball - 3 x daily - 7 x weekly - 1 sets - 10 reps - 5 seconds hold Supine Bridge - 3 x daily - 7 x weekly - 1 sets - 10 reps - 5 seconds hold

## 2021-03-12 ENCOUNTER — Other Ambulatory Visit (INDEPENDENT_AMBULATORY_CARE_PROVIDER_SITE_OTHER): Payer: Self-pay | Admitting: Physician Assistant

## 2021-03-12 DIAGNOSIS — F3289 Other specified depressive episodes: Secondary | ICD-10-CM

## 2021-03-13 ENCOUNTER — Encounter (HOSPITAL_COMMUNITY): Payer: Self-pay

## 2021-03-13 ENCOUNTER — Ambulatory Visit (HOSPITAL_COMMUNITY): Payer: Medicare Other

## 2021-03-13 ENCOUNTER — Other Ambulatory Visit: Payer: Self-pay

## 2021-03-13 DIAGNOSIS — M545 Low back pain, unspecified: Secondary | ICD-10-CM

## 2021-03-13 DIAGNOSIS — M6281 Muscle weakness (generalized): Secondary | ICD-10-CM

## 2021-03-13 DIAGNOSIS — M5441 Lumbago with sciatica, right side: Secondary | ICD-10-CM | POA: Diagnosis not present

## 2021-03-13 DIAGNOSIS — M5442 Lumbago with sciatica, left side: Secondary | ICD-10-CM | POA: Diagnosis not present

## 2021-03-13 DIAGNOSIS — G8929 Other chronic pain: Secondary | ICD-10-CM | POA: Diagnosis not present

## 2021-03-13 NOTE — Patient Instructions (Addendum)
Straight Leg: with Bent Knee (Supine)    With right leg totally straight at the knee, other leg bent, contract abdominals, raise straight leg 9-12_ inches. Repeat 10-15_ times per set. Do _1_ sets per session. Do 1_ sessions per day.  http://orth.exer.us/686   Copyright  VHI. All rights reserved.   Knee to Chest: Advanced    Lie with legs bent. Bring one knee up. Be sure pelvis does not tilt or rotate. Alternate back and forth. Restabilize pelvis. Repeat with other leg. Do ___ sets, ___ times per day.  http://ss.exer.us/10   Copyright  VHI. All rights reserved.   Log Roll    Lying on back, bend left knee and place left arm across chest. Roll all in one movement to the right. Reverse to roll to the left. Always move as one unit.   Copyright  VHI. All rights reserved.

## 2021-03-13 NOTE — Therapy (Signed)
[facility] [place], [place], [address] Phone: [phone]   Fax:  [phone]  Physical Therapy Treatment  Patient Details  Name: [name] [name] MRN: [id number] Date of Birth: 11/02/1951 Referring Provider (PT): Erline Levine MD   Encounter Date: 03/13/2021   PT End of Session - 03/13/21 0846    Visit Number 2    Number of Visits 8    Date for PT Re-Evaluation 04/07/21    Authorization Type Medicare A/ BCBS supplement 2nd (no auth)    PT Start Time [phone]    PT Stop Time 0935    PT Time Calculation (min) 44 min    Activity Tolerance Patient tolerated treatment well    Behavior During Therapy [facility] for tasks assessed/performed           Past Medical History:  Diagnosis Date  . Allergic rhinitis, unspecified    Allergy testing via allergist 04/2017--mostly neg.  Dr. Donneta Romberg recommended referral to ENT so i did this.  . Back pain    LBP->DDD/spondylosis w/ intermittent radiulopathy-type leg pains, + bilat hip and thigh pain-->spinal stenosis L4-5, L5-S1->Dr. Vertell Limber to do decomp and fusion surgery, scheduled for 12/03/20  . BPH (benign prostatic hypertrophy)    no hydro on renal u/s 05/2020  . Chronic nasal congestion   . DDD (degenerative disc disease), lumbar 2010   laminectomy 01/2009.  MR rpt 05/2019->advanced DDD/spondylosis L3-S1, with mod/sev L4-5 spinal stenosis-->plan for surg per Dr. Vertell Limber as of 09/2020  . Depression    Hospitalized for suicidal ideation 10.  Has been on lexapro since 10/2008.  . Diabetes mellitus type II    Dx'd approximately 06/2008.  No retinopathy as of 02/2017 ophth.  . Erectile dysfunction   . Heart murmur, systolic 47/0962   mild intensity, asymptomatic; echo 01/2018 valves fine->obs  . History of colon polyps 01/2016   Recall 3 yrs  . History of pneumonia 2008   Hospitalized  . History of scarlet fever    age 70  . Hyperlipidemia, mixed 1993   myalgias on pravastatin  . Hypertension  2009   "marked chronic cardiomegaly" on CXR 01/2009 per old records.  . 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 Insomnia   . Keratoconus    dx'd 1973  . Memory changes   . Mild cognitive impairment with memory loss 01/2020 eval with Dr. Delice Lesch   MRI brain 03/01/20 NORMAL  . OSA on CPAP 2004; 2019   Back on CPAP 04/2018.  Compliance great as of 01/2020 neuro/sleep f/u  . Osteoarthritis of both knees    Mild plain film changes in medial compartment bilat  . Restless leg    gabapentin helpful  . Rhinitis, chronic   . 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 APPENDECTOMY  1972  . COLONOSCOPY  X 4   Most recent was 2007 [place], [place]), with removal of polyps in the first two.  02/21/16: tubular adenoma.  Recall 3 yrs (Dr. Ardis Hughs).  . 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 ELECTROCARDIOGRAM  01/29/2009   NORMAL  . LUMBAR LAMINECTOMY  01/2009  . POSTERIOR LUMBAR FUSION  12/03/2020   L4-5, L5-S1 (Dr. Vertell Limber)  . TONSILLECTOMY AND ADENOIDECTOMY     age 70  . TRANSTHORACIC ECHOCARDIOGRAM  02/22/2018   EF 55-60%, grd I  DD  . URETHRAL DILATION     2 as a child, one as an adult  . VASECTOMY  1994  . VITRECTOMY      There were no vitals filed for this visit.   Subjective Assessment - 03/13/21 0845    Subjective Patient reports just central SI joint pain today at 1/10. Has performed HEP 2 times since evaluation. Was quite painful the day after the evaluation. Much better now.    Pertinent History Lumbar fusion 12/03/20    Limitations Lifting;Standing;Walking;House hold activities    How long can you stand comfortably? 15 minutes    How long can you walk comfortably? 15 minutes    Patient Stated Goals Get rid of pain, walk the dogs more, walk longer, improve balance    Currently in Pain? Yes    Pain Score 1     Pain Location Back    Pain Orientation Posterior;Medial    Pain  Onset Other (comment)             [facility] Adult PT Treatment/Exercise - 03/13/21 0001      Bed Mobility   Bed Mobility Left Sidelying to Sit;Sit to Sidelying Left    Left Sidelying to Sit Supervision/Verbal cueing;Contact Guard/Touching assist    Sit to Sidelying Left Supervision/Verbal cueing;Contact Guard/Touching assist      Lumbar Exercises: Supine   Ab Set 10 reps;5 seconds    Bent Knee Raise 10 reps;2 seconds    Bridge 10 reps;3 seconds    Other Supine Lumbar Exercises iso hip abd/ add 10 x 5"      Lumbar Exercises: Sidelying   Clam Both;10 reps;2 seconds            PT Education - 03/13/21 0845    Education Details Reviewed evaluation, goals and POC. Reviewed HEP. Advanced HEP.    Person(s) Educated Patient    Methods Explanation;Handout    Comprehension Verbalized understanding            PT Short Term Goals - 03/10/21 1153      PT SHORT TERM GOAL #1   Title Patient will be independent with initial HEP and self-management strategies to improve functional outcomes    Time 2    Period Weeks    Status New    Target Date 03/24/21             PT Long Term Goals - 03/10/21 1154      PT LONG TERM GOAL #1   Title Patient will have equal to or > 4+/5 MMT throughout BLE to improve ability to perform functional mobility, stair ambulation and ADLs.    Time 4    Period Weeks    Status New    Target Date 04/07/21      PT LONG TERM GOAL #2   Title Patient will report at least 75% overall improvement in subjective complaint to indicate improvement in ability to perform ADLs.    Time 4    Period Weeks    Status New    Target Date 04/07/21      PT LONG TERM GOAL #3   Title Patient will be able to maintain tandem stance >30 seconds on foam bilaterally to show improved stability and ability to ambulate outdoors    Time 4    Period Weeks    Status New    Target Date 04/07/21      PT LONG TERM GOAL #4   Title Patient will be able to walk >  30 minutes with no  increased back pain for walking dog and exercising outdoors.    Time 4    Period Weeks    Status New    Target Date 04/07/21             Plan - 03/13/21 0846    Clinical Impression Statement Reviewed initial evaluation, goals and plan of care with patient. Reviewed initial home exercise program with patient. Advanced therapeutic exercises increasing ab set, bridge and isometric hip abd/add to 10 repetitions, adding supine bent knee march/raise, log roll, sidelying clams and supine straight leg raise. Advanced HEP adding log roll, bent knee march/raise and supine straight leg raise. Patient tolerated treatment well but did complain of left side SI joint stiffness post exercises. No increased complaints of pain.    Personal Factors and Comorbidities Comorbidity 2    Comorbidities arthritis, DM    Examination-Activity Limitations Lift;Stand;Locomotion Level;Transfers;Carry;Stairs;Bend;Squat    Examination-Participation Restrictions Community Activity;Yard Work    Stability/Clinical Decision Making Stable/Uncomplicated    Rehab Potential Good    PT Frequency 2x / week    PT Duration 4 weeks    PT Treatment/Interventions ADLs/Self Care Home Management;Aquatic Therapy;Canalith Repostioning;Cryotherapy;Electrical Stimulation;Contrast Bath;Therapeutic exercise;Patient/family education;Orthotic Fit/Training;Manual lymph drainage;Compression bandaging;Taping;Vasopneumatic Device;Joint Manipulations;Splinting;Energy conservation;Spinal Manipulations;Visual/perceptual remediation/compensation;Passive range of motion;Dry needling;Manual techniques;Scar mobilization;Vestibular;Balance training;Neuromuscular re-education;DME Instruction;Gait training;Moist Heat;Traction;Stair training;Functional mobility training;Therapeutic activities;Ultrasound;Parrafin;Fluidtherapy;Biofeedback;Iontophoresis 4mg /ml Dexamethasone    PT Next Visit Plan Review HEP. Progress core and glute strength as tolerated. progress to  standing hip abd, sit to stand/ squat, heel raises and standing balance    PT Home Exercise Plan Eval: ab set, hip abd/ add iso, bridge    Consulted and Agree with Plan of Care Patient           Patient will benefit from skilled therapeutic intervention in order to improve the following deficits and impairments:  Pain,Improper body mechanics,Increased fascial restricitons,Abnormal gait,Decreased balance,Impaired flexibility,Decreased activity tolerance,Decreased range of motion,Decreased strength,Decreased mobility,Postural dysfunction  Visit Diagnosis: Low back pain, unspecified back pain laterality, unspecified chronicity, unspecified whether sciatica present  Muscle weakness (generalized)     Problem List Patient Active Problem List   Diagnosis Date Noted  . 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  . Vitamin D deficiency 04/16/2020  . Depression 04/16/2020  . Class 1 obesity with serious comorbidity and body mass index (BMI) of 33.0 to 33.9 in adult 04/16/2020  . Other fatigue 02/14/2018  . Shortness of breath on exertion 02/14/2018  . Type 2 diabetes mellitus with hyperglycemia, with long-term current use of insulin ([place]) 02/14/2018  . Abnormal EKG 02/14/2018  . Maxillary sinusitis, acute 12/17/2015  . Cough 12/17/2015  . Fever 12/17/2015  . Diabetes mellitus without complication ([place]) 82/50/5397  . Tooth pain 06/21/2013  . Orthostatic lightheadedness 05/17/2013  . Health maintenance examination 05/17/2013  . Groin strain 11/10/2012  . Hypogonadism, male 01/11/2012  . Type II or unspecified type diabetes mellitus without mention of complication, not stated as uncontrolled 07/08/2011  . HTN (hypertension), benign 07/08/2011  . Hyperlipidemia 07/08/2011  . BPH (benign prostatic hypertrophy) 07/08/2011  . Hx of adenomatous colonic polyps 07/08/2011   Floria Raveling. Hartnett-Rands, MS, PT [place] [phone]  Jeannie Done 03/13/2021,  9:41 AM  [facility] [address] [place], [place], [address] Phone: [phone]   Fax:  [phone]  Name: JASPER HANF MRN: [id number] Date of Birth: 1950-12-09

## 2021-03-18 ENCOUNTER — Ambulatory Visit (HOSPITAL_COMMUNITY): Payer: Medicare Other

## 2021-03-18 ENCOUNTER — Other Ambulatory Visit: Payer: Self-pay

## 2021-03-18 ENCOUNTER — Encounter: Payer: Self-pay | Admitting: Gastroenterology

## 2021-03-18 ENCOUNTER — Encounter (HOSPITAL_COMMUNITY): Payer: Self-pay

## 2021-03-18 DIAGNOSIS — M6281 Muscle weakness (generalized): Secondary | ICD-10-CM | POA: Diagnosis not present

## 2021-03-18 DIAGNOSIS — M5442 Lumbago with sciatica, left side: Secondary | ICD-10-CM | POA: Diagnosis not present

## 2021-03-18 DIAGNOSIS — M545 Low back pain, unspecified: Secondary | ICD-10-CM

## 2021-03-18 DIAGNOSIS — G8929 Other chronic pain: Secondary | ICD-10-CM

## 2021-03-18 DIAGNOSIS — M5441 Lumbago with sciatica, right side: Secondary | ICD-10-CM

## 2021-03-18 NOTE — Therapy (Signed)
[facility] [address] [place], [place], [address] Phone: [phone]   Fax:  [phone]  Physical Therapy Treatment  Patient Details  Name: [name] [name] MRN: [id number] Date of Birth: [birth year] Referring Provider (PT): Erline Levine MD   Encounter Date: 03/18/2021   PT End of Session - 03/18/21 1407    Visit Number 3    Number of Visits 8    Date for PT Re-Evaluation 04/07/21    Authorization Type Medicare A/ BCBS supplement 2nd (no auth)    PT Start Time 1401    PT Stop Time 1440    PT Time Calculation (min) 39 min    Activity Tolerance Patient tolerated treatment well    Behavior During Therapy [facility] for tasks assessed/performed           Past Medical History:  Diagnosis Date  . Allergic rhinitis, unspecified    Allergy testing via allergist 04/2017--mostly neg.  Dr. Donneta Romberg recommended referral to ENT so i did this.  . Back pain    LBP->DDD/spondylosis w/ intermittent radiulopathy-type leg pains, + bilat hip and thigh pain-->spinal stenosis L4-5, L5-S1->Dr. Vertell Limber to do decomp and fusion surgery, scheduled for 12/03/20  . BPH (benign prostatic hypertrophy)    no hydro on renal u/s 05/2020  . Chronic nasal congestion   . DDD (degenerative disc disease), lumbar 2010   laminectomy 01/2009.  MR rpt 05/2019->advanced DDD/spondylosis L3-S1, with mod/sev L4-5 spinal stenosis-->plan for surg per Dr. Vertell Limber as of 09/2020  . Depression    Hospitalized for suicidal ideation 56.  Has been on lexapro since 10/2008.  . Diabetes mellitus type II    Dx'd approximately 06/2008.  No retinopathy as of 02/2017 ophth.  . Erectile dysfunction   . Heart murmur, systolic 65/4650   mild intensity, asymptomatic; echo 01/2018 valves fine->obs  . History of colon polyps 01/2016   Recall 3 yrs  . History of pneumonia 2008   Hospitalized  . History of scarlet fever    age 65  . Hyperlipidemia, mixed 1993   myalgias on pravastatin  . Hypertension  2009   "marked chronic cardiomegaly" on CXR 01/2009 per old records.  . 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 Insomnia   . Keratoconus    dx'd 1973  . Memory changes   . Mild cognitive impairment with memory loss 01/2020 eval with Dr. Delice Lesch   MRI brain 03/01/20 NORMAL  . OSA on CPAP 2004; 2019   Back on CPAP 04/2018.  Compliance great as of 01/2020 neuro/sleep f/u  . Osteoarthritis of both knees    Mild plain film changes in medial compartment bilat  . Restless leg    gabapentin helpful  . Rhinitis, chronic   . 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 APPENDECTOMY  1972  . COLONOSCOPY  X 4   Most recent was 2007 [place], [place]), with removal of polyps in the first two.  02/21/16: tubular adenoma.  Recall 3 yrs (Dr. Ardis Hughs).  . 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 ELECTROCARDIOGRAM  01/29/2009   NORMAL  . LUMBAR LAMINECTOMY  01/2009  . POSTERIOR LUMBAR FUSION  12/03/2020   L4-5, L5-S1 (Dr. Vertell Limber)  . TONSILLECTOMY AND ADENOIDECTOMY     age 5  . TRANSTHORACIC ECHOCARDIOGRAM  02/22/2018   EF 55-60%, grd I  DD  . URETHRAL DILATION     2 as a child, one as an adult  . VASECTOMY  1994  . VITRECTOMY      There were no vitals filed for this visit.   Subjective Assessment - 03/18/21 1404    Subjective Pt reports minimal pain Lt SI joint today, pain scale 1/10.  REports he has completed the HEP daily, reports some discomfort following bridges.    Pertinent History Lumbar fusion 12/03/20    Patient Stated Goals Get rid of pain, walk the dogs more, walk longer, improve balance    Currently in Pain? Yes    Pain Score 1     Pain Location Back    Pain Orientation Lower;Posterior;Left    Pain Descriptors / Indicators Dull;Sharp    Pain Type Acute pain;Surgical pain    Pain Onset 1 to 4 weeks ago    Pain Frequency Intermittent    Aggravating Factors  walking, bending,  lifting    Pain Relieving Factors stretching, hot shower    Effect of Pain on Daily Activities Limits                             [facility] Adult PT Treatment/Exercise - 03/18/21 0001      Bed Mobility   Bed Mobility Left Sidelying to Sit;Sit to Sidelying Left    Left Sidelying to Sit Independent    Sit to Sidelying Left Independent      Exercises   Exercises Lumbar      Lumbar Exercises: Standing   Heel Raises 10 reps    Functional Squats 10 reps    Functional Squats Limitations cueing for mechanics front of mat    Other Standing Lumbar Exercises hip abduction/extension 10x      Lumbar Exercises: Seated   Sit to Stand 10 reps    Sit to Stand Limitations eccentric control      Lumbar Exercises: Supine   Ab Set 10 reps;5 seconds    Bent Knee Raise 10 reps;5 seconds    Bridge 10 reps;3 seconds    Other Supine Lumbar Exercises iso hip abd/ add 10 x 5"    Other Supine Lumbar Exercises Decompressoin exercise 2-5 3-5" holds      Lumbar Exercises: Sidelying   Clam 10 reps;5 seconds    Clam Limitations RTB around thigh                    PT Short Term Goals - 03/10/21 1153      PT SHORT TERM GOAL #1   Title Patient will be independent with initial HEP and self-management strategies to improve functional outcomes    Time 2    Period Weeks    Status New    Target Date 03/24/21             PT Long Term Goals - 03/10/21 1154      PT LONG TERM GOAL #1   Title Patient will have equal to or > 4+/5 MMT throughout BLE to improve ability to perform functional mobility, stair ambulation and ADLs.    Time 4    Period Weeks    Status New    Target Date 04/07/21      PT LONG TERM GOAL #2   Title Patient will report at least 75% overall improvement in subjective complaint to indicate improvement in ability to perform ADLs.    Time 4  Period Weeks    Status New    Target Date 04/07/21      PT LONG TERM GOAL #3   Title Patient will be able to  maintain tandem stance >30 seconds on foam bilaterally to show improved stability and ability to ambulate outdoors    Time 4    Period Weeks    Status New    Target Date 04/07/21      PT LONG TERM GOAL #4   Title Patient will be able to walk >30 minutes with no increased back pain for walking dog and exercising outdoors.    Time 4    Period Weeks    Status New    Target Date 04/07/21                 Plan - 03/18/21 1410    Clinical Impression Statement Pt able to recall and demonstrate appropriate mechanics wiht current HEP.  Pt given RTB to add to clams for hip strengthening.  Progressed to standing exercises, pt able to complete following initial cueing for stability with tasks, posture and form.  Pt c/o increased pressure during heel raises and squats, cueing to engange abdominal sets with all exercises.  Educated wiht decompression exercises for posterior strengthening.    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[employer]    Rehab Potential Good    PT Frequency 2x / week    PT Duration 4 weeks    PT Treatment/Interventions ADLs/Self Care Home Management;Aquatic Therapy;Canalith Repostioning;Cryotherapy;Electrical Stimulation;Contrast Bath;Therapeutic exercise;Patient/family education;Orthotic Fit/Training;Manual lymph drainage;Compression bandaging;Taping;Vasopneumatic Device;Joint Manipulations;Splinting;Energy conservation;Spinal Manipulations;Visual/perceptual remediation/compensation;Passive range of motion;Dry needling;Manual techniques;Scar mobilization;Vestibular;Balance training;Neuromuscular re-education;DME Instruction;Gait training;Moist Heat;Traction;Stair training;Functional  mobility training;Therapeutic activities;Ultrasound;Parrafin;Fluidtherapy;Biofeedback;Iontophoresis 4mg /ml Dexamethasone    PT Next Visit Plan Add theraband decompression exercises next session.  Progress core and glute strength as tolerated.  Continue with standing hip abd, sit to stand/ squat, heel raises and standing balance    PT Home Exercise Plan Eval: ab set, hip abd/ add iso, bridge, RTB for clams    Consulted and Agree with Plan of Care Patient           Patient will benefit from skilled therapeutic intervention in order to improve the following deficits and impairments:  Pain,Improper body mechanics,Increased fascial restricitons,Abnormal gait,Decreased balance,Impaired flexibility,Decreased activity tolerance,Decreased range of motion,Decreased strength,Decreased mobility,Postural dysfunction  Visit Diagnosis: Muscle weakness (generalized)  Chronic midline low back pain with bilateral sciatica  Low back pain, unspecified back pain laterality, unspecified chronicity, unspecified whether sciatica present     Problem List Patient Active Problem List   Diagnosis Date Noted  . 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  . Vitamin D deficiency 04/16/2020  . Depression 04/16/2020  . Class 1 obesity with serious comorbidity and body mass index (BMI) of 33.0 to 33.9 in adult 04/16/2020  . Other fatigue 02/14/2018  . Shortness of breath on exertion 02/14/2018  . Type 2 diabetes mellitus with hyperglycemia, with long-term current use of insulin ([place]) 02/14/2018  . Abnormal EKG 02/14/2018  . Maxillary sinusitis, acute 12/17/2015  . Cough 12/17/2015  . Fever 12/17/2015  . Diabetes mellitus without complication ([name]) 41/93/7902  . Tooth pain 06/21/2013  . Orthostatic lightheadedness 05/17/2013  . Health maintenance examination 05/17/2013  . Groin strain 11/10/2012  . Hypogonadism, male 01/11/2012  . Type II or unspecified  type diabetes mellitus without mention of  complication, not stated as uncontrolled 07/08/2011  . HTN (hypertension), benign 07/08/2011  . Hyperlipidemia 07/08/2011  . BPH (benign prostatic hypertrophy) 07/08/2011  . Hx of adenomatous colonic polyps 07/08/2011   Ihor Austin, LPTA/CLT; CBIS [phone]  Aldona Lento 03/18/2021, 2:44 PM  [facility] [address] [place], [place], [address] Phone: [phone]   Fax:  [phone]  Name: [name] [name] MRN: [id number] Date of Birth: [birth year]

## 2021-03-20 ENCOUNTER — Other Ambulatory Visit: Payer: Self-pay

## 2021-03-20 ENCOUNTER — Encounter (HOSPITAL_COMMUNITY): Payer: Self-pay

## 2021-03-20 ENCOUNTER — Ambulatory Visit (HOSPITAL_COMMUNITY): Payer: Medicare Other

## 2021-03-20 DIAGNOSIS — M6281 Muscle weakness (generalized): Secondary | ICD-10-CM | POA: Diagnosis not present

## 2021-03-20 DIAGNOSIS — M5442 Lumbago with sciatica, left side: Secondary | ICD-10-CM | POA: Diagnosis not present

## 2021-03-20 DIAGNOSIS — M5441 Lumbago with sciatica, right side: Secondary | ICD-10-CM | POA: Diagnosis not present

## 2021-03-20 DIAGNOSIS — M545 Low back pain, unspecified: Secondary | ICD-10-CM | POA: Diagnosis not present

## 2021-03-20 DIAGNOSIS — G8929 Other chronic pain: Secondary | ICD-10-CM

## 2021-03-20 NOTE — Therapy (Signed)
[facility] [address] [place], [place], [address] Phone: [phone]   Fax:  [phone]  Physical Therapy Treatment  Patient Details  Name: [name] [name] MRN: [id number] Date of Birth: Oct 28, 1951 Referring Provider (PT): Erline Levine MD   Encounter Date: 03/20/2021   PT End of Session - 03/20/21 1405    Visit Number 4    Number of Visits 8    Date for PT Re-Evaluation 04/07/21    Authorization Type Medicare A/ BCBS supplement 2nd (no auth)    PT Start Time 8502    PT Stop Time 1440    PT Time Calculation (min) 43 min    Activity Tolerance Patient tolerated treatment well    Behavior During Therapy [facility] for tasks assessed/performed           Past Medical History:  Diagnosis Date  . Allergic rhinitis, unspecified    Allergy testing via allergist 04/2017--mostly neg.  Dr. Donneta Romberg recommended referral to ENT so i did this.  . Back pain    LBP->DDD/spondylosis w/ intermittent radiulopathy-type leg pains, + bilat hip and thigh pain-->spinal stenosis L4-5, L5-S1->Dr. Vertell Limber to do decomp and fusion surgery, scheduled for 12/03/20  . BPH (benign prostatic hypertrophy)    no hydro on renal u/s 05/2020  . Chronic nasal congestion   . DDD (degenerative disc disease), lumbar 2010   laminectomy 01/2009.  MR rpt 05/2019->advanced DDD/spondylosis L3-S1, with mod/sev L4-5 spinal stenosis-->plan for surg per Dr. Vertell Limber as of 09/2020  . Depression    Hospitalized for suicidal ideation 11.  Has been on lexapro since 10/2008.  . Diabetes mellitus type II    Dx'd approximately 06/2008.  No retinopathy as of 02/2017 ophth.  . Erectile dysfunction   . Heart murmur, systolic 77/4128   mild intensity, asymptomatic; echo 01/2018 valves fine->obs  . History of colon polyps 01/2016   Recall 3 yrs  . History of pneumonia 2008   Hospitalized  . History of scarlet fever    age 37  . Hyperlipidemia, mixed 1993   myalgias on pravastatin  . Hypertension  2009   "marked chronic cardiomegaly" on CXR 01/2009 per old records.  . 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 Insomnia   . Keratoconus    dx'd 1973  . Memory changes   . Mild cognitive impairment with memory loss 01/2020 eval with Dr. Delice Lesch   MRI brain 03/01/20 NORMAL  . OSA on CPAP 2004; 2019   Back on CPAP 04/2018.  Compliance great as of 01/2020 neuro/sleep f/u  . Osteoarthritis of both knees    Mild plain film changes in medial compartment bilat  . Restless leg    gabapentin helpful  . Rhinitis, chronic   . 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 APPENDECTOMY  1972  . COLONOSCOPY  X 4   Most recent was 2007 [place], [place]), with removal of polyps in the first two.  02/21/16: tubular adenoma.  Recall 3 yrs (Dr. Ardis Hughs).  . 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 ELECTROCARDIOGRAM  01/29/2009   NORMAL  . LUMBAR LAMINECTOMY  01/2009  . POSTERIOR LUMBAR FUSION  12/03/2020   L4-5, L5-S1 (Dr. Vertell Limber)  . TONSILLECTOMY AND ADENOIDECTOMY     age 65  . TRANSTHORACIC ECHOCARDIOGRAM  02/22/2018   EF 55-60%, grd I  DD  . URETHRAL DILATION     2 as a child, one as an adult  . VASECTOMY  1994  . VITRECTOMY      There were no vitals filed for this visit.   Subjective Assessment - 03/20/21 1358    Subjective Pt stated he some pain Lt SI joint area today, pain scale 2/10.  Reports he has been compliance with HEP, has done some of the decompression exercises in bed at night. Really liked the ABD/ADD exercise with ball and belt.    Pertinent History Lumbar fusion 12/03/20    Patient Stated Goals Get rid of pain, walk the dogs more, walk longer, improve balance    Currently in Pain? Yes    Pain Score 2     Pain Location Back    Pain Orientation Posterior;Lower;Left    Pain Descriptors / Indicators Aching;Dull;Sore    Pain Type Surgical pain;Acute pain    Pain Onset 1 to 4  weeks ago    Pain Frequency Intermittent    Aggravating Factors  walking, bending, lifting    Pain Relieving Factors stretching, hot shower    Effect of Pain on Daily Activities Limits              [facility] PT Assessment - 03/20/21 0001      Assessment   Medical Diagnosis LBP    Referring Provider (PT) Erline Levine MD    Onset Date/Surgical Date 12/03/20      Balance   Balance Assessed Yes      Standardized Balance Assessment   Standardized Balance Assessment Five Times Sit to Stand    Balance Master Testing --   5 STS 11.25"                        [facility] Adult PT Treatment/Exercise - 03/20/21 0001      Bed Mobility   Bed Mobility Left Sidelying to Sit;Sit to Sidelying Left    Left Sidelying to Sit Independent with assistive device   min reminder for log rolling     Exercises   Exercises Lumbar      Lumbar Exercises: Standing   Heel Raises 15 reps    Heel Raises Limitations 5" holds    Functional Squats 10 reps    Functional Squats Limitations good mechanics followoing initial instruction    Other Standing Lumbar Exercises hip abduction/extension 10x    Other Standing Lumbar Exercises sidestep with RTB around thigh; tandem stance 2x30"      Lumbar Exercises: Seated   Sit to Stand 5 reps    Sit to Stand Limitations 5STS 11.25" no HHA      Lumbar Exercises: Supine   Ab Set 10 reps;5 seconds    Bridge Limitations reports discomfort, stopped after rep 2    Other Supine Lumbar Exercises Decompressoin exercise 1-4 with RTB 5reps      Lumbar Exercises: Prone   Straight Leg Raise 10 reps;3 seconds    Other Prone Lumbar Exercises heel squeeze 10x 5"                    PT Short Term Goals - 03/10/21 1153      PT SHORT TERM GOAL #1   Title Patient will be independent with initial HEP and self-management strategies to improve functional outcomes    Time 2    Period Weeks    Status New    Target Date 03/24/21  PT Long Term  Goals - 03/10/21 1154      PT LONG TERM GOAL #1   Title Patient will have equal to or > 4+/5 MMT throughout BLE to improve ability to perform functional mobility, stair ambulation and ADLs.    Time 4    Period Weeks    Status New    Target Date 04/07/21      PT LONG TERM GOAL #2   Title Patient will report at least 75% overall improvement in subjective complaint to indicate improvement in ability to perform ADLs.    Time 4    Period Weeks    Status New    Target Date 04/07/21      PT LONG TERM GOAL #3   Title Patient will be able to maintain tandem stance >30 seconds on foam bilaterally to show improved stability and ability to ambulate outdoors    Time 4    Period Weeks    Status New    Target Date 04/07/21      PT LONG TERM GOAL #4   Title Patient will be able to walk >30 minutes with no increased back pain for walking dog and exercising outdoors.    Time 4    Period Weeks    Status New    Target Date 04/07/21                 Plan - 03/20/21 1416    Clinical Impression Statement Added decompression with theraband for postural strengthening.  Reports of increased discomfort with bridge exercise so stopped exercise early.  Progressed to prone SLR and abduction exercises for gluteal strengthening.  Added sidestep and static balalnce with min cueing for posture awareness and abdominal activation that improved static balance.  Pt tolerated well to session, was limted by fatigue with activities.    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ery Low    Rehab Potential Good    PT Frequency 2x / week    PT Duration 4 weeks    PT Treatment/Interventions ADLs/Self Care Home Management;Aquatic  Therapy;Canalith Repostioning;Cryotherapy;Electrical Stimulation;Contrast Bath;Therapeutic exercise;Patient/family education;Orthotic Fit/Training;Manual lymph drainage;Compression bandaging;Taping;Vasopneumatic Device;Joint Manipulations;Splinting;Energy conservation;Spinal Manipulations;Visual/perceptual remediation/compensation;Passive range of motion;Dry needling;Manual techniques;Scar mobilization;Vestibular;Balance training;Neuromuscular re-education;DME Instruction;Gait training;Moist Heat;Traction;Stair training;Functional mobility training;Therapeutic activities;Ultrasound;Parrafin;Fluidtherapy;Biofeedback;Iontophoresis 4mg /ml Dexamethasone    PT Next Visit Plan Progress core and gluteal strength as tolerated.  Begin postural theraband activites, lunges, SLS, vector stance....    PT Home Exercise Plan Eval: ab set, hip abd/ add iso, bridge, RTB for clams    Consulted and Agree with Plan of Care Patient           Patient will benefit from skilled therapeutic intervention in order to improve the following deficits and impairments:  Pain,Improper body mechanics,Increased fascial restricitons,Abnormal gait,Decreased balance,Impaired flexibility,Decreased activity tolerance,Decreased range of motion,Decreased strength,Decreased mobility,Postural dysfunction  Visit Diagnosis: Muscle weakness (generalized)  Chronic midline low back pain with bilateral sciatica  Low back pain, unspecified back pain laterality, unspecified chronicity, unspecified whether sciatica present     Problem List Patient Active Problem List   Diagnosis Date Noted  . 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  . Vitamin D deficiency 04/16/2020  . Depression 04/16/2020  . Class 1 obesity with serious comorbidity and body mass index (BMI) of 33.0 to 33.9 in adult 04/16/2020  . Other fatigue  02/14/2018  . Shortness of breath on exertion 02/14/2018  . Type 2 diabetes mellitus with hyperglycemia, with long-term  current use of insulin ([place]) 02/14/2018  . Abnormal EKG 02/14/2018  . Maxillary sinusitis, acute 12/17/2015  . Cough 12/17/2015  . Fever 12/17/2015  . Diabetes mellitus without complication ([place]) 93/57/0177  . Tooth pain 06/21/2013  . Orthostatic lightheadedness 05/17/2013  . Health maintenance examination 05/17/2013  . Groin strain 11/10/2012  . Hypogonadism, male 01/11/2012  . Type II or unspecified type diabetes mellitus without mention of complication, not stated as uncontrolled 07/08/2011  . HTN (hypertension), benign 07/08/2011  . Hyperlipidemia 07/08/2011  . BPH (benign prostatic hypertrophy) 07/08/2011  . Hx of adenomatous colonic polyps 07/08/2011   Ihor Austin, LPTA/CLT; CBIS [phone]  Aldona Lento 03/20/2021, 2:47 PM  [facility] [address] [place], [place], [address] Phone: [phone]   Fax:  [phone]  Name: [name] [name] MRN: [id number] Date of Birth: 07/26/1951

## 2021-03-24 ENCOUNTER — Other Ambulatory Visit: Payer: Self-pay

## 2021-03-24 ENCOUNTER — Encounter (INDEPENDENT_AMBULATORY_CARE_PROVIDER_SITE_OTHER): Payer: Self-pay | Admitting: Physician Assistant

## 2021-03-24 ENCOUNTER — Ambulatory Visit (INDEPENDENT_AMBULATORY_CARE_PROVIDER_SITE_OTHER): Payer: Medicare Other | Admitting: Physician Assistant

## 2021-03-24 VITALS — BP 117/64 | HR 81 | Temp 98.1°F | Ht 70.0 in | Wt 227.0 lb

## 2021-03-24 DIAGNOSIS — Z794 Long term (current) use of insulin: Secondary | ICD-10-CM | POA: Diagnosis not present

## 2021-03-24 DIAGNOSIS — Z6834 Body mass index (BMI) 34.0-34.9, adult: Secondary | ICD-10-CM

## 2021-03-24 DIAGNOSIS — E119 Type 2 diabetes mellitus without complications: Secondary | ICD-10-CM | POA: Diagnosis not present

## 2021-03-24 DIAGNOSIS — F3289 Other specified depressive episodes: Secondary | ICD-10-CM | POA: Diagnosis not present

## 2021-03-24 DIAGNOSIS — E669 Obesity, unspecified: Secondary | ICD-10-CM | POA: Diagnosis not present

## 2021-03-24 DIAGNOSIS — E66811 Obesity, class 1: Secondary | ICD-10-CM

## 2021-03-24 MED ORDER — TRULICITY 1.5 MG/0.5ML ~~LOC~~ SOAJ: 1.5000 mg | SUBCUTANEOUS | 0 refills | Status: DC

## 2021-03-24 MED ORDER — ESCITALOPRAM OXALATE 20 MG PO TABS: ORAL_TABLET | ORAL | 0 refills | Status: DC

## 2021-03-25 ENCOUNTER — Ambulatory Visit (HOSPITAL_COMMUNITY): Payer: Medicare Other | Admitting: Physical Therapy

## 2021-03-25 ENCOUNTER — Encounter (HOSPITAL_COMMUNITY): Payer: Self-pay | Admitting: Physical Therapy

## 2021-03-25 DIAGNOSIS — M5441 Lumbago with sciatica, right side: Secondary | ICD-10-CM | POA: Diagnosis not present

## 2021-03-25 DIAGNOSIS — G8929 Other chronic pain: Secondary | ICD-10-CM | POA: Diagnosis not present

## 2021-03-25 DIAGNOSIS — M6281 Muscle weakness (generalized): Secondary | ICD-10-CM | POA: Diagnosis not present

## 2021-03-25 DIAGNOSIS — M545 Low back pain, unspecified: Secondary | ICD-10-CM | POA: Diagnosis not present

## 2021-03-25 DIAGNOSIS — M5442 Lumbago with sciatica, left side: Secondary | ICD-10-CM | POA: Diagnosis not present

## 2021-03-25 NOTE — Therapy (Signed)
[place], [place], [address] Phone: [phone]   Fax:  [phone]  Physical Therapy Treatment  Patient Details  Name: [name] [name] MRN: [id number] Date of Birth: Jul 12, 1951 Referring Provider (PT): Erline Levine MD   Encounter Date: 03/25/2021   PT End of Session - 03/25/21 0910    Visit Number 5    Number of Visits 8    Date for PT Re-Evaluation 04/07/21    Authorization Type Medicare A/ BCBS supplement 2nd (no auth)    PT Start Time 0904    PT Stop Time 0945    PT Time Calculation (min) 41 min    Activity Tolerance Patient tolerated treatment well    Behavior During Therapy [facility] for tasks assessed/performed           Past Medical History:  Diagnosis Date  . Allergic rhinitis, unspecified    Allergy testing via allergist 04/2017--mostly neg.  Dr. Donneta Romberg recommended referral to ENT so i did this.  . Back pain    LBP->DDD/spondylosis w/ intermittent radiulopathy-type leg pains, + bilat hip and thigh pain-->spinal stenosis L4-5, L5-S1->Dr. Vertell Limber to do decomp and fusion surgery, scheduled for 12/03/20  . BPH (benign prostatic hypertrophy)    no hydro on renal u/s 05/2020  . Chronic nasal congestion   . DDD (degenerative disc disease), lumbar 2010   laminectomy 01/2009.  MR rpt 05/2019->advanced DDD/spondylosis L3-S1, with mod/sev L4-5 spinal stenosis-->plan for surg per Dr. Vertell Limber as of 09/2020  . Depression    Hospitalized for suicidal ideation 31.  Has been on lexapro since 10/2008.  . Diabetes mellitus type II    Dx'd approximately 06/2008.  No retinopathy as of 02/2017 ophth.  . Erectile dysfunction   . Heart murmur, systolic 69/6295   mild intensity, asymptomatic; echo 01/2018 valves fine->obs  . History of colon polyps 01/2016   Recall 3 yrs  . History of pneumonia 2008   Hospitalized  . History of scarlet fever    age 70  . Hyperlipidemia, mixed 1993   myalgias on pravastatin  . Hypertension  2009   "marked chronic cardiomegaly" on CXR 01/2009 per old records.  . 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 Insomnia   . Keratoconus    dx'd 1973  . Memory changes   . Mild cognitive impairment with memory loss 01/2020 eval with Dr. Delice Lesch   MRI brain 03/01/20 NORMAL  . OSA on CPAP 2004; 2019   Back on CPAP 04/2018.  Compliance great as of 01/2020 neuro/sleep f/u  . Osteoarthritis of both knees    Mild plain film changes in medial compartment bilat  . Restless leg    gabapentin helpful  . Rhinitis, chronic   . 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 APPENDECTOMY  1972  . COLONOSCOPY  X 4   Most recent was 2007 [place], [place]), with removal of polyps in the first two.  02/21/16: tubular adenoma.  Recall 3 yrs (Dr. Ardis Hughs).  . 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 ELECTROCARDIOGRAM  01/29/2009   NORMAL  . LUMBAR LAMINECTOMY  01/2009  . POSTERIOR LUMBAR FUSION  12/03/2020   L4-5, L5-S1 (Dr. Vertell Limber)  . TONSILLECTOMY AND ADENOIDECTOMY     age 40  . TRANSTHORACIC ECHOCARDIOGRAM  02/22/2018   EF 55-60%, grd I  DD  . URETHRAL DILATION     2 as a child, one as an adult  . VASECTOMY  1994  . VITRECTOMY      There were no vitals filed for this visit.   Subjective Assessment - 03/25/21 0909    Subjective Patient says he has been sore at night. Uncomfortable trying to sleep. Did not sleep well last night.    Pertinent History Lumbar fusion 12/03/20    Patient Stated Goals Get rid of pain, walk the dogs more, walk longer, improve balance    Currently in Pain? No/denies    Pain Onset 1 to 4 weeks ago                             [facility] Adult PT Treatment/Exercise - 03/25/21 0001      Lumbar Exercises: Standing   Heel Raises 20 reps    Functional Squats 10 reps    Functional Squats Limitations to chair for depth cue    Other Standing Lumbar Exercises  hip abduction/extension x 20 each      Lumbar Exercises: Supine   Ab Set 10 reps;5 seconds    Pelvic Tilt 15 reps   with tactile cues   Glut Set 20 reps;5 seconds    Bent Knee Raise 20 reps    Dead Bug 20 reps   modified   Bridge 5 reps   with ab set, still disomfort in low back   Straight Leg Raise 20 reps    Other Supine Lumbar Exercises iso hip abd/ add 20 x 5"      Lumbar Exercises: Prone   Straight Leg Raise 20 reps                    PT Short Term Goals - 03/10/21 1153      PT SHORT TERM GOAL #1   Title Patient will be independent with initial HEP and self-management strategies to improve functional outcomes    Time 2    Period Weeks    Status New    Target Date 03/24/21             PT Long Term Goals - 03/10/21 1154      PT LONG TERM GOAL #1   Title Patient will have equal to or > 4+/5 MMT throughout BLE to improve ability to perform functional mobility, stair ambulation and ADLs.    Time 4    Period Weeks    Status New    Target Date 04/07/21      PT LONG TERM GOAL #2   Title Patient will report at least 75% overall improvement in subjective complaint to indicate improvement in ability to perform ADLs.    Time 4    Period Weeks    Status New    Target Date 04/07/21      PT LONG TERM GOAL #3   Title Patient will be able to maintain tandem stance >30 seconds on foam bilaterally to show improved stability and ability to ambulate outdoors    Time 4    Period Weeks    Status New    Target Date 04/07/21      PT LONG TERM GOAL #4   Title Patient will be able to walk >30 minutes with no increased back pain for walking dog and exercising outdoors.    Time 4    Period Weeks    Status New  Target Date 04/07/21                 Plan - 03/25/21 0945    Clinical Impression Statement Patient notes continued low level back pain with bridging. Switched to prone hip extension, this was better but still with some slight discomfort in lumbar.  Added glute set in hook lying, able to perform with no back pain. Patient cued on TA activation throughout session. Increased reps with table strengthening and progressed core strength exercises on mat. Added pelvic tilts for improved lumbar mobility. Patient well challenged with this, requiring verbal and tactile cues for proper form. Added to HEP. Patient will continue to benefit from skilled therapy services to reduce deficits and improve functional level.    Personal Factors and Comorbidities Comorbidity 2    Comorbidities arthritis, DM    Examination-Activity Limitations Lift;Stand;Locomotion Level;Transfers;Carry;Stairs;Bend;Squat    Examination-Participation Restrictions Community Activity;Yard Work    Stability/Clinical Decision Making Stable/Uncomplicated    Rehab Potential Good    PT Frequency 2x / week    PT Duration 4 weeks    PT Treatment/Interventions ADLs/Self Care Home Management;Aquatic Therapy;Canalith Repostioning;Cryotherapy;Electrical Stimulation;Contrast Bath;Therapeutic exercise;Patient/family education;Orthotic Fit/Training;Manual lymph drainage;Compression bandaging;Taping;Vasopneumatic Device;Joint Manipulations;Splinting;Energy conservation;Spinal Manipulations;Visual/perceptual remediation/compensation;Passive range of motion;Dry needling;Manual techniques;Scar mobilization;Vestibular;Balance training;Neuromuscular re-education;DME Instruction;Gait training;Moist Heat;Traction;Stair training;Functional mobility training;Therapeutic activities;Ultrasound;Parrafin;Fluidtherapy;Biofeedback;Iontophoresis 4mg /ml Dexamethasone    PT Next Visit Plan Progress core and gluteal strength as tolerated.  Begin postural theraband activites, lunges, SLS, vector stance....    PT Home Exercise Plan Eval: ab set, hip abd/ add iso, bridge, RTB for clams 4/26: pelvic tilt, glute set    Consulted and Agree with Plan of Care Patient           Patient will benefit from skilled therapeutic  intervention in order to improve the following deficits and impairments:  Pain,Improper body mechanics,Increased fascial restricitons,Abnormal gait,Decreased balance,Impaired flexibility,Decreased activity tolerance,Decreased range of motion,Decreased strength,Decreased mobility,Postural dysfunction  Visit Diagnosis: Muscle weakness (generalized)  Chronic midline low back pain with bilateral sciatica  Low back pain, unspecified back pain laterality, unspecified chronicity, unspecified whether sciatica present     Problem List Patient Active Problem List   Diagnosis Date Noted  . 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  . Vitamin D deficiency 04/16/2020  . Depression 04/16/2020  . Class 1 obesity with serious comorbidity and body mass index (BMI) of 33.0 to 33.9 in adult 04/16/2020  . Other fatigue 02/14/2018  . Shortness of breath on exertion 02/14/2018  . Type 2 diabetes mellitus with hyperglycemia, with long-term current use of insulin ([place]) 02/14/2018  . Abnormal EKG 02/14/2018  . Maxillary sinusitis, acute 12/17/2015  . Cough 12/17/2015  . Fever 12/17/2015  . Diabetes mellitus without complication ([place]) 50/93/2671  . Tooth pain 06/21/2013  . Orthostatic lightheadedness 05/17/2013  . Health maintenance examination 05/17/2013  . Groin strain 11/10/2012  . Hypogonadism, male 01/11/2012  . Type II or unspecified type diabetes mellitus without mention of complication, not stated as uncontrolled 07/08/2011  . HTN (hypertension), benign 07/08/2011  . Hyperlipidemia 07/08/2011  . BPH (benign prostatic hypertrophy) 07/08/2011  . Hx of adenomatous colonic polyps 07/08/2011    10:48 AM, 03/25/21 Josue Hector PT DPT  Physical Therapist with [facility]  [phone] [place] [address] [place], [place], [address] Phone: [phone]   Fax:  [phone]  Name: TREYLAN MCCLINTOCK MRN:  [id number] Date of Birth: January 21, 1951

## 2021-03-25 NOTE — Progress Notes (Signed)
Chief Complaint:   OBESITY [name] [name] is here to discuss his progress with his obesity treatment plan along with follow-up of his obesity related diagnoses. [name] [name] is on keeping a food journal and adhering to recommended goals of 1800 calories and 125 grams of protein daily and states he is following his eating plan approximately 70% of the time. [name] states he is doing 0 minutes 0 times per week.  Today's visit was #: 34 Starting weight: 273 lbs Starting date: 02/14/2018 Today's weight: 227 lbs Today's date: 03/24/2021 Total lbs lost to date: 46 Total lbs lost since last in-office visit: 1  Interim History: [name] [name] is in physical therapy twice weekly for his back. He is bored with food and tired of "dieting" and he wants to eat Whoppers and ribs.  Subjective:   1. Type 2 diabetes mellitus without complication, with long-term current use of insulin (HCC) [name] [name]'s last A1c was 5.9. He is on Ozempic, but it will no longer be covered by his insurance.  2. Other depression, with emotional eating  [name] [name] denies suicidal or homicidal ideas. He is on Lexapro and his mood is stable.  Assessment/Plan:   1. Type 2 diabetes mellitus without complication, with long-term current use of insulin ([place]) [name] agreed to discontinue Ozempic, and will start Trulicity 1.5 mg weekly with no refills. Good blood sugar control is important to decrease the likelihood of diabetic complications such as nephropathy, neuropathy, limb loss, blindness, coronary artery disease, and death. Intensive lifestyle modification including diet, exercise and weight loss are the first line of treatment for diabetes.  - Dulaglutide (TRULICITY) 1.5 JX/9.1YN SOPN; Inject 1.5 mg into the skin once a week.  Dispense: 2 mL; Refill: 0  2. Other depression, with emotional eating  Behavior modification techniques were discussed today to help [name] deal with his emotional/non-hunger eating behaviors. We will refill Lexapro for 90  days with no refills. Orders and follow up as documented in patient record.   - escitalopram (LEXAPRO) 20 MG tablet; TAKE 1 TABLET(20 MG) BY MOUTH DAILY  Dispense: 90 tablet; Refill: 0  3. Class 1 obesity with serious comorbidity and body mass index (BMI) of 34.0 to 34.9 in adult, unspecified obesity type [name] [name] is currently in the action stage of change. As such, his goal is to continue with weight loss efforts. He has agreed to the Category 4 Plan.   We will rechec fasting labs at his next visit.  Exercise goals: No exercise has been prescribed at this time.  Behavioral modification strategies: meal planning and cooking strategies and keeping healthy foods in the home.  [name] [name] has agreed to follow-up with our clinic in 4 weeks. He was informed of the importance of frequent follow-up visits to maximize his success with intensive lifestyle modifications for his multiple health conditions.   Objective:   Blood pressure 117/64, pulse 81, temperature 98.1 F (36.7 C), temperature source Oral, height 5\' 10"  (1.778 m), weight 227 lb (103 kg), SpO2 94 %. Body mass index is 32.57 kg/m.  General: Cooperative, alert, well developed, in no acute distress. HEENT: Conjunctivae and lids unremarkable. Cardiovascular: Regular rhythm.  Lungs: Normal work of breathing. Neurologic: No focal deficits.   Lab Results  Component Value Date   CREATININE 0.95 01/28/2021   BUN 23 01/28/2021   NA 141 01/28/2021   K 4.2 01/28/2021   CL 103 01/28/2021   CO2 22 01/28/2021   Lab Results  Component Value Date   ALT 15 01/28/2021   AST  12 01/28/2021   ALKPHOS 43 (L) 01/28/2021   BILITOT 0.3 01/28/2021   Lab Results  Component Value Date   HGBA1C 5.3 12/02/2020   HGBA1C 5.9 (H) 08/29/2020   HGBA1C 5.7 (H) 05/09/2020   HGBA1C 5.8 (H) 12/12/2019   HGBA1C 6.1 (H) 08/22/2019   Lab Results  Component Value Date   INSULIN 5.8 08/29/2020   INSULIN 5.3 05/09/2020   Lab Results  Component Value Date    TSH 1.25 02/05/2020   Lab Results  Component Value Date   CHOL 129 01/28/2021   HDL 48 01/28/2021   LDLCALC 66 01/28/2021   LDLDIRECT 112.0 07/31/2016   TRIG 76 01/28/2021   CHOLHDL 2.7 01/28/2021   Lab Results  Component Value Date   WBC 6.4 12/02/2020   HGB 12.2 (L) 12/02/2020   HCT 38.3 (L) 12/02/2020   MCV 88.5 12/02/2020   PLT 271 12/02/2020   No results found for: IRON, TIBC, FERRITIN  Obesity Behavioral Intervention:   Approximately 15 minutes were spent on the discussion below.  ASK: We discussed the diagnosis of obesity with [name] [name] today and [name] [name] agreed to give [name] permission to discuss obesity behavioral modification therapy today.  ASSESS: [name] [name] has the diagnosis of obesity and his BMI today is 32.57. [name] [name] is in the action stage of change.   ADVISE: [name] [name] was educated on the multiple health risks of obesity as well as the benefit of weight loss to improve his health. He was advised of the need for long term treatment and the importance of lifestyle modifications to improve his current health and to decrease his risk of future health problems.  AGREE: Multiple dietary modification options and treatment options were discussed and [name] [name] agreed to follow the recommendations documented in the above note.  ARRANGE: Swain was educated on the importance of frequent visits to treat obesity as outlined per CMS and USPSTF guidelines and agreed to schedule his next follow up appointment today.  Attestation Statements:   Reviewed by clinician on day of visit: allergies, medications, problem list, medical history, surgical history, family history, social history, and previous encounter notes.   Wilhemena Durie, am acting as transcriptionist for [employer], PA-C.  I have reviewed the above documentation for accuracy and completeness, and I agree with the above. Abby Potash, PA-C

## 2021-03-25 NOTE — Patient Instructions (Signed)
Access Code: [email or web address] URL: [email or web address] Date: 03/25/2021 Prepared by: Josue Hector  Exercises Supine Posterior Pelvic Tilt - 3 x daily - 7 x weekly - 2 sets - 10 reps - 5 second hold Hooklying Gluteal Sets - 3 x daily - 7 x weekly - 2 sets - 10 reps - 5 second hold

## 2021-03-27 ENCOUNTER — Other Ambulatory Visit: Payer: Self-pay

## 2021-03-27 ENCOUNTER — Encounter (HOSPITAL_COMMUNITY): Payer: Self-pay

## 2021-03-27 ENCOUNTER — Ambulatory Visit (HOSPITAL_COMMUNITY): Payer: Medicare Other

## 2021-03-27 DIAGNOSIS — M5441 Lumbago with sciatica, right side: Secondary | ICD-10-CM | POA: Diagnosis not present

## 2021-03-27 DIAGNOSIS — M6281 Muscle weakness (generalized): Secondary | ICD-10-CM | POA: Diagnosis not present

## 2021-03-27 DIAGNOSIS — G8929 Other chronic pain: Secondary | ICD-10-CM | POA: Diagnosis not present

## 2021-03-27 DIAGNOSIS — M545 Low back pain, unspecified: Secondary | ICD-10-CM

## 2021-03-27 DIAGNOSIS — M5442 Lumbago with sciatica, left side: Secondary | ICD-10-CM | POA: Diagnosis not present

## 2021-03-27 NOTE — Therapy (Signed)
[place], [place], [address] Phone: [phone]   Fax:  [phone]  Physical Therapy Treatment  Patient Details  Name: [name] [name] MRN: [id number] Date of Birth: [birth year] Referring Provider (PT): Erline Levine MD   Encounter Date: 03/27/2021   PT End of Session - 03/27/21 1045    Visit Number 6    Number of Visits 8    Date for PT Re-Evaluation 04/07/21    Authorization Type Medicare A/ BCBS supplement 2nd (no auth)    PT Start Time 1030    PT Stop Time 1112    PT Time Calculation (min) 42 min    Activity Tolerance Patient tolerated treatment well    Behavior During Therapy [facility] for tasks assessed/performed           Past Medical History:  Diagnosis Date  . Allergic rhinitis, unspecified    Allergy testing via allergist 04/2017--mostly neg.  Dr. Donneta Romberg recommended referral to ENT so i did this.  . Back pain    LBP->DDD/spondylosis w/ intermittent radiulopathy-type leg pains, + bilat hip and thigh pain-->spinal stenosis L4-5, L5-S1->Dr. Vertell Limber to do decomp and fusion surgery, scheduled for 12/03/20  . BPH (benign prostatic hypertrophy)    no hydro on renal u/s 05/2020  . Chronic nasal congestion   . DDD (degenerative disc disease), lumbar 2010   laminectomy 01/2009.  MR rpt 05/2019->advanced DDD/spondylosis L3-S1, with mod/sev L4-5 spinal stenosis-->plan for surg per Dr. Vertell Limber as of 09/2020  . Depression    Hospitalized for suicidal ideation 81.  Has been on lexapro since 10/2008.  . Diabetes mellitus type II    Dx'd approximately 06/2008.  No retinopathy as of 02/2017 ophth.  . Erectile dysfunction   . Heart murmur, systolic 16/9678   mild intensity, asymptomatic; echo 01/2018 valves fine->obs  . History of colon polyps 01/2016   Recall 3 yrs  . History of pneumonia 2008   Hospitalized  . History of scarlet fever    age 66  . Hyperlipidemia, mixed 1993   myalgias on pravastatin  . Hypertension  2009   "marked chronic cardiomegaly" on CXR 01/2009 per old records.  . 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 Insomnia   . Keratoconus    dx'd 1973  . Memory changes   . Mild cognitive impairment with memory loss 01/2020 eval with Dr. Delice Lesch   MRI brain 03/01/20 NORMAL  . OSA on CPAP 2004; 2019   Back on CPAP 04/2018.  Compliance great as of 01/2020 neuro/sleep f/u  . Osteoarthritis of both knees    Mild plain film changes in medial compartment bilat  . Restless leg    gabapentin helpful  . Rhinitis, chronic   . 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 APPENDECTOMY  1972  . COLONOSCOPY  X 4   Most recent was 2007 [place], [place]), with removal of polyps in the first two.  02/21/16: tubular adenoma.  Recall 3 yrs (Dr. Ardis Hughs).  . 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 ELECTROCARDIOGRAM  01/29/2009   NORMAL  . LUMBAR LAMINECTOMY  01/2009  . POSTERIOR LUMBAR FUSION  12/03/2020   L4-5, L5-S1 (Dr. Vertell Limber)  . TONSILLECTOMY AND ADENOIDECTOMY     age 52  . TRANSTHORACIC ECHOCARDIOGRAM  02/22/2018   EF 55-60%, grd I  DD  . URETHRAL DILATION     2 as a child, one as an adult  . VASECTOMY  1994  . VITRECTOMY      There were no vitals filed for this visit.   Subjective Assessment - 03/27/21 1037    Subjective Started hurting a lot by evening after last session such that wasn't able to do much all day yesterday up to 7.5//10 sharp shooting pain into right hip with back twisting or side bending. Preventatively took pain med and muscle relaxer before today's session.    Pertinent History Lumbar fusion 12/03/20    Patient Stated Goals Get rid of pain, walk the dogs more, walk longer, improve balance    Currently in Pain? Yes    Pain Score 1     Pain Location Back    Pain Orientation Lower;Medial;Left;Right    Pain Descriptors / Indicators Sore    Pain Onset 1 to 4 weeks ago             [facility] Adult PT Treatment/Exercise - 03/27/21 0001      Exercises   Exercises Lumbar      Lumbar Exercises: Standing   Heel Raises 20 reps    Functional Squats 10 reps    Functional Squats Limitations to chair for depth cue    Other Standing Lumbar Exercises hip abduction/extension x 20 each      Lumbar Exercises: Supine   Ab Set 10 reps;5 seconds    Pelvic Tilt 15 reps   with tactile cues   Glut Set 20 reps;5 seconds    Bent Knee Raise 20 reps    Dead Bug 20 reps   modified   Bridge 5 reps   with ab set, noted slight pinch in left hip   Straight Leg Raise 5 reps    Straight Leg Raises Limitations 4 sets w/ ab set focus    Other Supine Lumbar Exercises iso hip abd/ add 20 x 5"      Lumbar Exercises: Prone   Straight Leg Raise 20 reps   left posterior hip discomfort           PT Education - 03/27/21 1055    Education Details Discussed purpose and technique of interventions throughout session. Educated on path of iliopsoas and importance of ab set prior to lifting legs during SLR.    Person(s) Educated Patient    Methods Explanation    Comprehension Verbalized understanding            PT Short Term Goals - 03/27/21 1047      PT SHORT TERM GOAL #1   Title Patient will be independent with initial HEP and self-management strategies to improve functional outcomes    Time 2    Period Weeks    Status On-going    Target Date 03/24/21             PT Long Term Goals - 03/27/21 1047      PT LONG TERM GOAL #1   Title Patient will have equal to or > 4+/5 MMT throughout BLE to improve ability to perform functional mobility, stair ambulation and ADLs.    Time 4    Period Weeks    Status On-going      PT LONG TERM GOAL #2   Title Patient will report at least 75% overall improvement in subjective complaint to indicate improvement in ability to perform ADLs.    Baseline 03/26/21 - patient reports 15% improvement this far.  Time 4    Period Weeks    Status  On-going      PT LONG TERM GOAL #3   Title Patient will be able to maintain tandem stance >30 seconds on foam bilaterally to show improved stability and ability to ambulate outdoors    Time 4    Period Weeks    Status On-going      PT LONG TERM GOAL #4   Title Patient will be able to walk >30 minutes with no increased back pain for walking dog and exercising outdoors.    Baseline 03/26/21 - reports walking about 1/5 of a mile with wife and one small dog but can't go further yet.    Time 4    Period Weeks    Status On-going                 Plan - 03/27/21 1046    Clinical Impression Statement Recreated last sessions ther ex with adjustment to SLR sets, reps and ab set focus to determine response to treatment. Patient reported slight increased in pain across low back at end of session with no pain in either hip. Patient did complain of left hip pain during prone SLR and supine bridge without last effect. Monitor patient's response to ther ex next session.    Personal Factors and Comorbidities Comorbidity 2    Comorbidities arthritis, DM    Examination-Activity Limitations Lift;Stand;Locomotion Level;Transfers;Carry;Stairs;Bend;Squat    Examination-Participation Restrictions Community Activity;Yard Work    Stability/Clinical Decision Making Stable/Uncomplicated    Rehab Potential Good    PT Frequency 2x / week    PT Duration 4 weeks    PT Treatment/Interventions ADLs/Self Care Home Management;Aquatic Therapy;Canalith Repostioning;Cryotherapy;Electrical Stimulation;Contrast Bath;Therapeutic exercise;Patient/family education;Orthotic Fit/Training;Manual lymph drainage;Compression bandaging;Taping;Vasopneumatic Device;Joint Manipulations;Splinting;Energy conservation;Spinal Manipulations;Visual/perceptual remediation/compensation;Passive range of motion;Dry needling;Manual techniques;Scar mobilization;Vestibular;Balance training;Neuromuscular re-education;DME Instruction;Gait  training;Moist Heat;Traction;Stair training;Functional mobility training;Therapeutic activities;Ultrasound;Parrafin;Fluidtherapy;Biofeedback;Iontophoresis 4mg /ml Dexamethasone    PT Next Visit Plan Progress core and gluteal strength as tolerated.  Begin postural theraband activites, lunges, SLS, vector stance....    PT Home Exercise Plan Eval: ab set, hip abd/ add iso, bridge, RTB for clams 4/26: pelvic tilt, glute set    Consulted and Agree with Plan of Care Patient           Patient will benefit from skilled therapeutic intervention in order to improve the following deficits and impairments:  Pain,Improper body mechanics,Increased fascial restricitons,Abnormal gait,Decreased balance,Impaired flexibility,Decreased activity tolerance,Decreased range of motion,Decreased strength,Decreased mobility,Postural dysfunction  Visit Diagnosis: Muscle weakness (generalized)  Chronic midline low back pain with bilateral sciatica  Low back pain, unspecified back pain laterality, unspecified chronicity, unspecified whether sciatica present     Problem List Patient Active Problem List   Diagnosis Date Noted  . 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  . Vitamin D deficiency 04/16/2020  . Depression 04/16/2020  . Class 1 obesity with serious comorbidity and body mass index (BMI) of 33.0 to 33.9 in adult 04/16/2020  . Other fatigue 02/14/2018  . Shortness of breath on exertion 02/14/2018  . Type 2 diabetes mellitus with hyperglycemia, with long-term current use of insulin ([place]) 02/14/2018  . Abnormal EKG 02/14/2018  . Maxillary sinusitis, acute 12/17/2015  . Cough 12/17/2015  . Fever 12/17/2015  . Diabetes mellitus without complication ([place]) 19/14/7829  . Tooth pain 06/21/2013  . Orthostatic lightheadedness 05/17/2013  . Health maintenance examination 05/17/2013  . Groin strain 11/10/2012  . Hypogonadism, male 01/11/2012  . Type II or unspecified type diabetes mellitus without mention  of complication,  not stated as uncontrolled 07/08/2011  . HTN (hypertension), benign 07/08/2011  . Hyperlipidemia 07/08/2011  . BPH (benign prostatic hypertrophy) 07/08/2011  . Hx of adenomatous colonic polyps 07/08/2011   Floria Raveling. Hartnett-Rands, MS, PT Per [place] [phone]  Jeannie Done 03/27/2021, 12:17 PM  [place] [address] [place], [place], [address] Phone: [phone]   Fax:  [phone]  Name: DIAN MINAHAN MRN: [id number] Date of Birth: 03/13/1951

## 2021-04-01 ENCOUNTER — Encounter (HOSPITAL_COMMUNITY): Payer: Self-pay

## 2021-04-01 ENCOUNTER — Other Ambulatory Visit: Payer: Self-pay

## 2021-04-01 ENCOUNTER — Ambulatory Visit (HOSPITAL_COMMUNITY): Payer: Medicare Other | Attending: Neurosurgery

## 2021-04-01 DIAGNOSIS — M545 Low back pain, unspecified: Secondary | ICD-10-CM | POA: Diagnosis not present

## 2021-04-01 DIAGNOSIS — M6281 Muscle weakness (generalized): Secondary | ICD-10-CM

## 2021-04-01 DIAGNOSIS — G8929 Other chronic pain: Secondary | ICD-10-CM | POA: Diagnosis not present

## 2021-04-01 DIAGNOSIS — M5441 Lumbago with sciatica, right side: Secondary | ICD-10-CM | POA: Insufficient documentation

## 2021-04-01 DIAGNOSIS — M5442 Lumbago with sciatica, left side: Secondary | ICD-10-CM | POA: Diagnosis not present

## 2021-04-01 NOTE — Therapy (Signed)
[facility] [address] [place], [place], [address] Phone: [phone]   Fax:  [phone]  Physical Therapy Treatment  Patient Details  Name: [name] [name] MRN: [id number] Date of Birth: 10-27-1951 Referring Provider (PT): Erline Levine MD   Encounter Date: 04/01/2021   PT End of Session - 04/01/21 1006    Visit Number 7    Number of Visits 8    Date for PT Re-Evaluation 04/07/21    Authorization Type Medicare A/ BCBS supplement 2nd (no auth)    PT Start Time 1002    PT Stop Time 1040    PT Time Calculation (min) 38 min    Activity Tolerance Patient tolerated treatment well    Behavior During Therapy [facility] for tasks assessed/performed           Past Medical History:  Diagnosis Date  . Allergic rhinitis, unspecified    Allergy testing via allergist 04/2017--mostly neg.  Dr. Donneta Romberg recommended referral to ENT so i did this.  . Back pain    LBP->DDD/spondylosis w/ intermittent radiulopathy-type leg pains, + bilat hip and thigh pain-->spinal stenosis L4-5, L5-S1->Dr. Vertell Limber to do decomp and fusion surgery, scheduled for 12/03/20  . BPH (benign prostatic hypertrophy)    no hydro on renal u/s 05/2020  . Chronic nasal congestion   . DDD (degenerative disc disease), lumbar 2010   laminectomy 01/2009.  MR rpt 05/2019->advanced DDD/spondylosis L3-S1, with mod/sev L4-5 spinal stenosis-->plan for surg per Dr. Vertell Limber as of 09/2020  . Depression    Hospitalized for suicidal ideation 1.  Has been on lexapro since 10/2008.  . Diabetes mellitus type II    Dx'd approximately 06/2008.  No retinopathy as of 02/2017 ophth.  . Erectile dysfunction   . Heart murmur, systolic 67/8938   mild intensity, asymptomatic; echo 01/2018 valves fine->obs  . History of colon polyps 01/2016   Recall 3 yrs  . History of pneumonia 2008   Hospitalized  . History of scarlet fever    age 18  . Hyperlipidemia, mixed 1993   myalgias on pravastatin  . Hypertension  2009   "marked chronic cardiomegaly" on CXR 01/2009 per old records.  . 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 Insomnia   . Keratoconus    dx'd 1973  . Memory changes   . Mild cognitive impairment with memory loss 01/2020 eval with Dr. Delice Lesch   MRI brain 03/01/20 NORMAL  . OSA on CPAP 2004; 2019   Back on CPAP 04/2018.  Compliance great as of 01/2020 neuro/sleep f/u  . Osteoarthritis of both knees    Mild plain film changes in medial compartment bilat  . Restless leg    gabapentin helpful  . Rhinitis, chronic   . 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 APPENDECTOMY  1972  . COLONOSCOPY  X 4   Most recent was 2007 [place], [place]), with removal of polyps in the first two.  02/21/16: tubular adenoma.  Recall 3 yrs (Dr. Ardis Hughs).  . 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 ELECTROCARDIOGRAM  01/29/2009   NORMAL  . LUMBAR LAMINECTOMY  01/2009  . POSTERIOR LUMBAR FUSION  12/03/2020   L4-5, L5-S1 (Dr. Vertell Limber)  . TONSILLECTOMY AND ADENOIDECTOMY     age 60  . TRANSTHORACIC ECHOCARDIOGRAM  02/22/2018   EF 55-60%, grd I  DD  . URETHRAL DILATION     2 as a child, one as an adult  . VASECTOMY  1994  . VITRECTOMY      There were no vitals filed for this visit.   Subjective Assessment - 04/01/21 1002    Subjective Pt stated he woke up this morning with achey across lower back at a 3/10.  No reports of pain upon arrival to therapy, did take pain meds and muscle relaxor before therapy today.    Patient Stated Goals Get rid of pain, walk the dogs more, walk longer, improve balance    Currently in Pain? No/denies                             [facility] Adult PT Treatment/Exercise - 04/01/21 0001      Exercises   Exercises Lumbar      Lumbar Exercises: Standing   Heel Raises 20 reps    Heel Raises Limitations 3" holds; 2 sets    Row 10 reps;Theraband     Theraband Level (Row) Level 3 (Green)    Row Limitations with ab set    Shoulder Extension 10 reps;Theraband    Theraband Level (Shoulder Extension) Level 3 (Green)    Shoulder Extension Limitations with ab set    Other Standing Lumbar Exercises vector stance 3x 5" with HHA    Other Standing Lumbar Exercises Paloff GTB 2x 15 NBOS      Lumbar Exercises: Supine   Pelvic Tilt 15 reps;5 seconds    Bent Knee Raise 20 reps    Dead Bug 20 reps      Lumbar Exercises: Prone   Straight Leg Raise 20 reps   pillows under hips with reports of comfort   Other Prone Lumbar Exercises heel squeeze 10x 5"                    PT Short Term Goals - 03/27/21 1047      PT SHORT TERM GOAL #1   Title Patient will be independent with initial HEP and self-management strategies to improve functional outcomes    Time 2    Period Weeks    Status On-going    Target Date 03/24/21             PT Long Term Goals - 03/27/21 1047      PT LONG TERM GOAL #1   Title Patient will have equal to or > 4+/5 MMT throughout BLE to improve ability to perform functional mobility, stair ambulation and ADLs.    Time 4    Period Weeks    Status On-going      PT LONG TERM GOAL #2   Title Patient will report at least 75% overall improvement in subjective complaint to indicate improvement in ability to perform ADLs.    Baseline 03/26/21 - patient reports 15% improvement this far.    Time 4    Period Weeks    Status On-going      PT LONG TERM GOAL #3   Title Patient will be able to maintain tandem stance >30 seconds on foam bilaterally to show improved stability and ability to ambulate outdoors    Time 4    Period Weeks    Status On-going      PT LONG TERM GOAL #4   Title Patient will be able to walk >30 minutes with no increased back pain for walking dog and exercising outdoors.  Baseline 03/26/21 - reports walking about 1/5 of a mile with wife and one small dog but can't go further yet.    Time 4     Period Weeks    Status On-going                 Plan - 04/01/21 1032    Clinical Impression Statement Added pillows under hips to address spinal alignment with reoprts of relief during prone SLR.  Progressed core and hip stability with additional vector stance, paloff press and postural strengthening wiht some cueing for abdominal contraction prior movement.  No reoprts of increased pain through session, was limited by fatigue and reports of pressure during standing exercises.  EOS pt reports back feels good as walking out.    Comorbidities arthritis, DM    Examination-Activity Limitations Lift;Stand;Locomotion Level;Transfers;Carry;Stairs;Bend;Squat    Examination-Participation Restrictions Community Activity;Yard Work    Stability/Clinical Decision Making Stable/Uncomplicated    Designer, jewellery Low    Rehab Potential Good    PT Frequency 2x / week    PT Duration 4 weeks    PT Treatment/Interventions ADLs/Self Care Home Management;Aquatic Therapy;Canalith Repostioning;Cryotherapy;Electrical Stimulation;Contrast Bath;Therapeutic exercise;Patient/family education;Orthotic Fit/Training;Manual lymph drainage;Compression bandaging;Taping;Vasopneumatic Device;Joint Manipulations;Splinting;Energy conservation;Spinal Manipulations;Visual/perceptual remediation/compensation;Passive range of motion;Dry needling;Manual techniques;Scar mobilization;Vestibular;Balance training;Neuromuscular re-education;DME Instruction;Gait training;Moist Heat;Traction;Stair training;Functional mobility training;Therapeutic activities;Ultrasound;Parrafin;Fluidtherapy;Biofeedback;Iontophoresis 4mg /ml Dexamethasone    PT Next Visit Plan Review goals next session.  Progress core and gluteal strength as tolerated.  Continue postural theraband activites, lunges, SLS, vector stance....    PT Home Exercise Plan Eval: ab set, hip abd/ add iso, bridge, RTB for clams 4/26: pelvic tilt, glute set    Consulted and Agree  with Plan of Care Patient           Patient will benefit from skilled therapeutic intervention in order to improve the following deficits and impairments:  Pain,Improper body mechanics,Increased fascial restricitons,Abnormal gait,Decreased balance,Impaired flexibility,Decreased activity tolerance,Decreased range of motion,Decreased strength,Decreased mobility,Postural dysfunction  Visit Diagnosis: Chronic midline low back pain with bilateral sciatica  Low back pain, unspecified back pain laterality, unspecified chronicity, unspecified whether sciatica present  Muscle weakness (generalized)     Problem List Patient Active Problem List   Diagnosis Date Noted  . 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  . Vitamin D deficiency 04/16/2020  . Depression 04/16/2020  . Class 1 obesity with serious comorbidity and body mass index (BMI) of 33.0 to 33.9 in adult 04/16/2020  . Other fatigue 02/14/2018  . Shortness of breath on exertion 02/14/2018  . Type 2 diabetes mellitus with hyperglycemia, with long-term current use of insulin ([place]) 02/14/2018  . Abnormal EKG 02/14/2018  . Maxillary sinusitis, acute 12/17/2015  . Cough 12/17/2015  . Fever 12/17/2015  . Diabetes mellitus without complication ([place]) 03/50/0938  . Tooth pain 06/21/2013  . Orthostatic lightheadedness 05/17/2013  . Health maintenance examination 05/17/2013  . Groin strain 11/10/2012  . Hypogonadism, male 01/11/2012  . Type II or unspecified type diabetes mellitus without mention of complication, not stated as uncontrolled 07/08/2011  . HTN (hypertension), benign 07/08/2011  . Hyperlipidemia 07/08/2011  . BPH (benign prostatic hypertrophy) 07/08/2011  . Hx of adenomatous colonic polyps 07/08/2011   Ihor Austin, LPTA/CLT; CBIS [phone]  Aldona Lento 04/01/2021, 10:43 AM  [facility] [address] [place], [place], [address] Phone: [phone]   Fax:   [phone]  Name: [name] [name] MRN: [id number] Date of Birth: [birth year]

## 2021-04-03 ENCOUNTER — Ambulatory Visit (HOSPITAL_COMMUNITY): Payer: Medicare Other

## 2021-04-03 ENCOUNTER — Other Ambulatory Visit: Payer: Self-pay

## 2021-04-03 ENCOUNTER — Encounter (HOSPITAL_COMMUNITY): Payer: Self-pay

## 2021-04-03 DIAGNOSIS — M5441 Lumbago with sciatica, right side: Secondary | ICD-10-CM | POA: Diagnosis not present

## 2021-04-03 DIAGNOSIS — G8929 Other chronic pain: Secondary | ICD-10-CM | POA: Diagnosis not present

## 2021-04-03 DIAGNOSIS — M545 Low back pain, unspecified: Secondary | ICD-10-CM | POA: Diagnosis not present

## 2021-04-03 DIAGNOSIS — M5442 Lumbago with sciatica, left side: Secondary | ICD-10-CM

## 2021-04-03 DIAGNOSIS — M6281 Muscle weakness (generalized): Secondary | ICD-10-CM | POA: Diagnosis not present

## 2021-04-03 NOTE — Addendum Note (Signed)
Addended by: Jeannie Done on: 04/03/2021 12:11 PM   Modules accepted: Orders

## 2021-04-03 NOTE — Therapy (Addendum)
[facility] [address] [place], [place], [address] Phone: [phone]   Fax:  [phone]  Physical Therapy Treatment/Re-Assessment/Progress Note  Patient Details  Name: [name] [name] MRN: [id number] Date of Birth: October 25, 1951 Referring Provider (PT): Erline Levine MD  Progress Note Reporting Period 03/10/2021 to 04/03/2021  See note below for Objective Data and Assessment of Progress/Goals.      Encounter Date: 04/03/2021   PT End of Session - 04/03/21 0929    Visit Number 8    Number of Visits 16    Date for PT Re-Evaluation 05/01/21   re-assessment 04/03/21   Authorization Type Medicare A/ BCBS supplement 2nd (no auth)    Progress Note Due on Visit 16    PT Start Time 0930    PT Stop Time 1015    PT Time Calculation (min) 45 min    Activity Tolerance Patient tolerated treatment well    Behavior During Therapy [facility] for tasks assessed/performed           Past Medical History:  Diagnosis Date  . Allergic rhinitis, unspecified    Allergy testing via allergist 04/2017--mostly neg.  Dr. Donneta Romberg recommended referral to ENT so i did this.  . Back pain    LBP->DDD/spondylosis w/ intermittent radiulopathy-type leg pains, + bilat hip and thigh pain-->spinal stenosis L4-5, L5-S1->Dr. Vertell Limber to do decomp and fusion surgery, scheduled for 12/03/20  . BPH (benign prostatic hypertrophy)    no hydro on renal u/s 05/2020  . Chronic nasal congestion   . DDD (degenerative disc disease), lumbar 2010   laminectomy 01/2009.  MR rpt 05/2019->advanced DDD/spondylosis L3-S1, with mod/sev L4-5 spinal stenosis-->plan for surg per Dr. Vertell Limber as of 09/2020  . Depression    Hospitalized for suicidal ideation 36.  Has been on lexapro since 10/2008.  . Diabetes mellitus type II    Dx'd approximately 06/2008.  No retinopathy as of 02/2017 ophth.  . Erectile dysfunction   . Heart murmur, systolic 29/5188   mild intensity, asymptomatic; echo 01/2018 valves  fine->obs  . History of colon polyps 01/2016   Recall 3 yrs  . History of pneumonia 2008   Hospitalized  . History of scarlet fever    age 89  . Hyperlipidemia, mixed 1993   myalgias on pravastatin  . Hypertension 2009   "marked chronic cardiomegaly" on CXR 01/2009 per old records.  . 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 Insomnia   . Keratoconus    dx'd 1973  . Memory changes   . Mild cognitive impairment with memory loss 01/2020 eval with Dr. Delice Lesch   MRI brain 03/01/20 NORMAL  . OSA on CPAP 2004; 2019   Back on CPAP 04/2018.  Compliance great as of 01/2020 neuro/sleep f/u  . Osteoarthritis of both knees    Mild plain film changes in medial compartment bilat  . Restless leg    gabapentin helpful  . Rhinitis, chronic   . 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 APPENDECTOMY  1972  . COLONOSCOPY  X 4   Most recent was 2007 [place], [place]), with removal of polyps in the first two.  02/21/16: tubular adenoma.  Recall 3 yrs (Dr. Ardis Hughs).  . 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 ELECTROCARDIOGRAM  01/29/2009   NORMAL  . LUMBAR LAMINECTOMY  01/2009  .  POSTERIOR LUMBAR FUSION  12/03/2020   L4-5, L5-S1 (Dr. Vertell Limber)  . TONSILLECTOMY AND ADENOIDECTOMY     age 39  . TRANSTHORACIC ECHOCARDIOGRAM  02/22/2018   EF 55-60%, grd I DD  . URETHRAL DILATION     2 as a child, one as an adult  . VASECTOMY  1994  . VITRECTOMY      There were no vitals filed for this visit.   Subjective Assessment - 04/03/21 0928    Subjective 1/10 pain on right side of back today. Both gluts were muscle sore after last session. Was able to perform decompression exercises without bands and soreness was gone afterwards. Feels his ability to walk is still quite limited and isn't where he wants to be yet. Does perform his HEP regularly and does feel he is improving strength wise overall. Just his  function is lacking yet.    Patient Stated Goals Get rid of pain, walk the dogs more, walk longer, improve balance    Currently in Pain? Yes    Pain Score 1     Pain Location Back    Pain Orientation Right;Posterior;Lower              [facility] PT Assessment - 04/03/21 0001      Assessment   Medical Diagnosis LBP    Referring Provider (PT) Erline Levine MD    Onset Date/Surgical Date 12/03/20      Observation/Other Assessments   Focus on Therapeutic Outcomes (FOTO)  51% function   was 52% function     Strength   Strength Assessment Site Hip;Knee;Ankle    Right/Left Hip Right;Left    Right Hip Flexion 5/5   was 4+/5   Right Hip Extension 4+/5   was 4/5   Right Hip ABduction 4+/5   was 4/5   Left Hip Flexion 4+/5   was 4/5   Left Hip Extension 4-/5   was 3+/5   Left Hip ABduction 4/5   was 3+/5   Right/Left Knee Right;Left    Right Knee Extension 5/5   was 4+/5   Left Knee Extension 5/5   was 4/5   Right Ankle Dorsiflexion 5/5   was 4+/5   Left Ankle Dorsiflexion 4+/5   was 4/5           [facility] Adult PT Treatment/Exercise - 04/03/21 0001      Lumbar Exercises: Standing   Other Standing Lumbar Exercises vector stance 3x 5" with HHA    Other Standing Lumbar Exercises tandem stance balance max left fwd 12 sec; right fwd 27 sec              PT Education - 04/03/21 1010    Education Details Reviewed goals and progress. Discussed purpose and technique of interventsions throughout session. Advanced HEP.    Person(s) Educated Patient    Methods Explanation;Handout    Comprehension Verbalized understanding            PT Short Term Goals - 04/03/21 0932      PT SHORT TERM GOAL #1   Title Patient will be independent with initial HEP and self-management strategies to improve functional outcomes    Time 2    Period Weeks    Status Achieved    Target Date 03/24/21             PT Long Term Goals - 04/03/21 0933      PT LONG TERM GOAL #1   Title Patient will  have equal to  or > 4+/5 MMT throughout BLE to improve ability to perform functional mobility, stair ambulation and ADLs.    Time 4    Period Weeks    Status On-going    Target Date 05/01/21      PT LONG TERM GOAL #2   Title Patient will report at least 75% overall improvement in subjective complaint to indicate improvement in ability to perform ADLs.    Baseline 03/26/21 - patient reports 15% improvement this far. 04/01/21 - patient reports 20%    Time 4    Period Weeks    Status On-going      PT LONG TERM GOAL #3   Title Patient will be able to maintain tandem stance >30 seconds on foam bilaterally to show improved stability and ability to ambulate outdoors    Baseline 04/01/21 - tandem on foam left foot forward 12 seconds; right foot forward 27 seconds.    Time 4    Period Weeks    Status On-going      PT LONG TERM GOAL #4   Title Patient will be able to walk >30 minutes with no increased back pain for walking dog and exercising outdoors.    Baseline 5/3//22 - reports walking about 1/5 of a mile with wife and one small dog but can't go further yet.    Time 4    Period Weeks    Status On-going                 Plan - 04/03/21 0929    Clinical Impression Statement Patient presenting to PT session to review goal progress. Patient continues with core and lower extremity strength deficits, decreased balance and impaired functional abilities with limitations in being able to walk greater than 1/5 mile prior to an increase in his low back pain. Patient did exhibit improvements in all areas but continues with deficits. Patient has been compliant with HEP and has utilize it to reduce pain complaints and progressively get stronger. Advanced HEP to include tandem stance balance activities. Patient would benefit from continued skilled physical therapy to continue to address strength, balance, and functional deficits.    Comorbidities arthritis, DM    Examination-Activity Limitations  Lift;Stand;Locomotion Level;Transfers;Carry;Stairs;Bend;Squat    Examination-Participation Restrictions Community Activity;Yard Work    Stability/Clinical Decision Making Stable/Uncomplicated    Rehab Potential Good    PT Frequency 2x / week    PT Duration 4 weeks    PT Treatment/Interventions ADLs/Self Care Home Management;Aquatic Therapy;Canalith Repostioning;Cryotherapy;Electrical Stimulation;Contrast Bath;Therapeutic exercise;Patient/family education;Orthotic Fit/Training;Manual lymph drainage;Compression bandaging;Taping;Vasopneumatic Device;Joint Manipulations;Splinting;Energy conservation;Spinal Manipulations;Visual/perceptual remediation/compensation;Passive range of motion;Dry needling;Manual techniques;Scar mobilization;Vestibular;Balance training;Neuromuscular re-education;DME Instruction;Gait training;Moist Heat;Traction;Stair training;Functional mobility training;Therapeutic activities;Ultrasound;Parrafin;Fluidtherapy;Biofeedback;Iontophoresis 4mg /ml Dexamethasone    PT Next Visit Plan Review goals next session.  Progress core and gluteal strength as tolerated.  Continue postural theraband activites, lunges, SLS, vector stance....    PT Home Exercise Plan Eval: ab set, hip abd/ add iso, bridge, RTB for clams 4/26: pelvic tilt, glute set; 04/03/21 - tandem stance on solid/compliant surfaces with/without head/arm movements.    Consulted and Agree with Plan of Care Patient           Patient will benefit from skilled therapeutic intervention in order to improve the following deficits and impairments:  Pain,Improper body mechanics,Increased fascial restricitons,Abnormal gait,Decreased balance,Impaired flexibility,Decreased activity tolerance,Decreased range of motion,Decreased strength,Decreased mobility,Postural dysfunction  Visit Diagnosis: Chronic midline low back pain with bilateral sciatica  Low back pain, unspecified back pain laterality, unspecified chronicity, unspecified whether  sciatica present  Muscle  weakness (generalized)     Problem List Patient Active Problem List   Diagnosis Date Noted  . 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  . Vitamin D deficiency 04/16/2020  . Depression 04/16/2020  . Class 1 obesity with serious comorbidity and body mass index (BMI) of 33.0 to 33.9 in adult 04/16/2020  . Other fatigue 02/14/2018  . Shortness of breath on exertion 02/14/2018  . Type 2 diabetes mellitus with hyperglycemia, with long-term current use of insulin ([place]) 02/14/2018  . Abnormal EKG 02/14/2018  . Maxillary sinusitis, acute 12/17/2015  . Cough 12/17/2015  . Fever 12/17/2015  . Diabetes mellitus without complication ([place]) 16/08/9603  . Tooth pain 06/21/2013  . Orthostatic lightheadedness 05/17/2013  . Health maintenance examination 05/17/2013  . Groin strain 11/10/2012  . Hypogonadism, male 01/11/2012  . Type II or unspecified type diabetes mellitus without mention of complication, not stated as uncontrolled 07/08/2011  . HTN (hypertension), benign 07/08/2011  . Hyperlipidemia 07/08/2011  . BPH (benign prostatic hypertrophy) 07/08/2011  . Hx of adenomatous colonic polyps 07/08/2011   Floria Raveling. Hartnett-Rands, MS, PT Per [place] [phone]  Jeannie Done 04/03/2021, 12:08 PM  [facility] [address] [place], [place], [address] Phone: [phone]   Fax:  [phone]  Name: [name] [name] MRN: [id number] Date of Birth: 09/08/1951

## 2021-04-03 NOTE — Patient Instructions (Signed)
Tandem Stance    Right foot in front of left, heel touching toe both feet "straight ahead". Stand on Foot Triangle of Support with both feet. Balance in this position 30_ seconds. Do with left foot in front of right.  Copyright  VHI. All rights reserved.   Tandem Standing    Stand heel to toe: 1.Look right _10__ times. 2. Look left _10__ times. 3. Look up 10_ times. IT is more challenging to alternate head movements. 4. With right arm, reach right, left, forward and back. Repeat ____ times. Do ____ sessions per day. SEPARATELY: Another thing you can do is stand on a head pillow, or a couch cushion; attempt 30 second bouts alternating which foot is forward.  [email or web address].exer.[email or web address]   Copyright  VHI. All rights reserved.

## 2021-04-08 ENCOUNTER — Encounter (HOSPITAL_COMMUNITY): Payer: Self-pay

## 2021-04-08 ENCOUNTER — Other Ambulatory Visit: Payer: Self-pay

## 2021-04-08 ENCOUNTER — Ambulatory Visit (HOSPITAL_COMMUNITY): Payer: Medicare Other

## 2021-04-08 DIAGNOSIS — M6281 Muscle weakness (generalized): Secondary | ICD-10-CM | POA: Diagnosis not present

## 2021-04-08 DIAGNOSIS — M5442 Lumbago with sciatica, left side: Secondary | ICD-10-CM | POA: Diagnosis not present

## 2021-04-08 DIAGNOSIS — G8929 Other chronic pain: Secondary | ICD-10-CM

## 2021-04-08 DIAGNOSIS — M545 Low back pain, unspecified: Secondary | ICD-10-CM

## 2021-04-08 DIAGNOSIS — M5441 Lumbago with sciatica, right side: Secondary | ICD-10-CM | POA: Diagnosis not present

## 2021-04-08 NOTE — Therapy (Signed)
[facility] [address] [place], [place], [address] Phone: [phone]   Fax:  [phone]  Physical Therapy Treatment  Patient Details  Name: [name] [name] MRN: [id number] Date of Birth: [birth year] Referring Provider (PT): Erline Levine MD   Encounter Date: 04/08/2021   PT End of Session - 04/08/21 1051    Visit Number 9    Number of Visits 16    Date for PT Re-Evaluation 05/01/21   re-assessment 04/03/21   Authorization Type Medicare A/ BCBS supplement 2nd (no auth)    Progress Note Due on Visit 16    PT Start Time 0955    PT Stop Time 1038    PT Time Calculation (min) 43 min    Activity Tolerance Patient tolerated treatment well    Behavior During Therapy [facility] for tasks assessed/performed           Past Medical History:  Diagnosis Date  . Allergic rhinitis, unspecified    Allergy testing via allergist 04/2017--mostly neg.  Dr. Donneta Romberg recommended referral to ENT so i did this.  . Back pain    LBP->DDD/spondylosis w/ intermittent radiulopathy-type leg pains, + bilat hip and thigh pain-->spinal stenosis L4-5, L5-S1->Dr. Vertell Limber to do decomp and fusion surgery, scheduled for 12/03/20  . BPH (benign prostatic hypertrophy)    no hydro on renal u/s 05/2020  . Chronic nasal congestion   . DDD (degenerative disc disease), lumbar 2010   laminectomy 01/2009.  MR rpt 05/2019->advanced DDD/spondylosis L3-S1, with mod/sev L4-5 spinal stenosis-->plan for surg per Dr. Vertell Limber as of 09/2020  . Depression    Hospitalized for suicidal ideation 62.  Has been on lexapro since 10/2008.  . Diabetes mellitus type II    Dx'd approximately 06/2008.  No retinopathy as of 02/2017 ophth.  . Erectile dysfunction   . Heart murmur, systolic 36/6440   mild intensity, asymptomatic; echo 01/2018 valves fine->obs  . History of colon polyps 01/2016   Recall 3 yrs  . History of pneumonia 2008   Hospitalized  . History of scarlet fever    age 50  . Hyperlipidemia,  mixed 1993   myalgias on pravastatin  . Hypertension 2009   "marked chronic cardiomegaly" on CXR 01/2009 per old records.  . 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 Insomnia   . Keratoconus    dx'd 1973  . Memory changes   . Mild cognitive impairment with memory loss 01/2020 eval with Dr. Delice Lesch   MRI brain 03/01/20 NORMAL  . OSA on CPAP 2004; 2019   Back on CPAP 04/2018.  Compliance great as of 01/2020 neuro/sleep f/u  . Osteoarthritis of both knees    Mild plain film changes in medial compartment bilat  . Restless leg    gabapentin helpful  . Rhinitis, chronic   . 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 APPENDECTOMY  1972  . COLONOSCOPY  X 4   Most recent was 2007 [place], [place]), with removal of polyps in the first two.  02/21/16: tubular adenoma.  Recall 3 yrs (Dr. Ardis Hughs).  . 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 ELECTROCARDIOGRAM  01/29/2009   NORMAL  . LUMBAR LAMINECTOMY  01/2009  . POSTERIOR LUMBAR FUSION  12/03/2020   L4-5, L5-S1 (Dr. Vertell Limber)  . TONSILLECTOMY AND ADENOIDECTOMY     age 42  .  TRANSTHORACIC ECHOCARDIOGRAM  02/22/2018   EF 55-60%, grd I DD  . URETHRAL DILATION     2 as a child, one as an adult  . VASECTOMY  1994  . VITRECTOMY      There were no vitals filed for this visit.   Subjective Assessment - 04/08/21 1002    Subjective Pt stated he is achey today, reports increased pain when walking.    Patient Stated Goals Get rid of pain, walk the dogs more, walk longer, improve balance    Currently in Pain? Yes    Pain Score 1     Pain Location Back    Pain Orientation Lower;Right    Pain Descriptors / Indicators Aching    Pain Type Surgical pain    Pain Onset 1 to 4 weeks ago    Pain Frequency Intermittent    Aggravating Factors  walking, bending, lifting    Pain Relieving Factors stretching, hot shower    Effect of Pain on Daily  Activities limits              [facility] PT Assessment - 04/08/21 0001      Assessment   Medical Diagnosis LBP    Referring Provider (PT) Erline Levine MD    Onset Date/Surgical Date 12/03/20    Next MD Visit June (maybe 15th)    Prior Therapy No                         [facility] Adult PT Treatment/Exercise - 04/08/21 0001      Lumbar Exercises: Standing   Heel Raises 10 reps    Heel Raises Limitations 3" holds; 2 sets    Functional Squats 10 reps    Functional Squats Limitations to chair for depth cue    Forward Lunge 10 reps;3 seconds    Forward Lunge Limitations with abdominal sets, no HHA    Shoulder Extension 10 reps    Theraband Level (Shoulder Extension) Level 3 (Green)    Shoulder Extension Limitations Lats pull down with GTB over doorway    Other Standing Lumbar Exercises posterior pelvic rotation front of wall (tactile and verbal cueing);  GTB pull down over doorway with ab set    Other Standing Lumbar Exercises Walking with a purpose (standing tall with ab set) x 2 min, reports of increased pressure               Balance Exercises - 04/08/21 0001      Balance Exercises: Standing   Tandem Stance 3 reps;30 secs   static, head turns and paloff 4x 15 with GTB   SLS 5 reps   1-3" max   SLS with Vectors 3 reps;Intermittent upper extremity assist   3x 5"   Marching Solid surface;Intermittent upper extremity assist;10 reps   with ab set   Heel Raises Both;10 reps   2 sets 3" holds            PT Education - 04/08/21 1020    Education Details Benefits of lumbar support to improve seated posture.  Reviewed findings with current goals following re-assessment last session.    Person(s) Educated Patient    Methods Demonstration    Comprehension Verbalized understanding            PT Short Term Goals - 04/03/21 0932      PT SHORT TERM GOAL #1   Title Patient will be independent with initial HEP and self-management strategies to improve functional  outcomes    Time 2    Period Weeks    Status Achieved    Target Date 03/24/21             PT Long Term Goals - 04/03/21 0933      PT LONG TERM GOAL #1   Title Patient will have equal to or > 4+/5 MMT throughout BLE to improve ability to perform functional mobility, stair ambulation and ADLs.    Time 4    Period Weeks    Status On-going    Target Date 05/01/21      PT LONG TERM GOAL #2   Title Patient will report at least 75% overall improvement in subjective complaint to indicate improvement in ability to perform ADLs.    Baseline 03/26/21 - patient reports 15% improvement this far. 04/01/21 - patient reports 20%    Time 4    Period Weeks    Status On-going      PT LONG TERM GOAL #3   Title Patient will be able to maintain tandem stance >30 seconds on foam bilaterally to show improved stability and ability to ambulate outdoors    Baseline 04/01/21 - tandem on foam left foot forward 12 seconds; right foot forward 27 seconds.    Time 4    Period Weeks    Status On-going      PT LONG TERM GOAL #4   Title Patient will be able to walk >30 minutes with no increased back pain for walking dog and exercising outdoors.    Baseline 5/3//22 - reports walking about 1/5 of a mile with wife and one small dog but can't go further yet.    Time 4    Period Weeks    Status On-going                 Plan - 04/08/21 1052    Clinical Impression Statement Session focus on core and proximal strengthening.  Added lunges for functional strengthening with cueing for mechanics.  Pt continues to exhibit balance deficitis with inability to SLS greater than 3".  Discussed seated posture with educated on lumbar support to reduce pressure on back.  Pt continues to be irritated with walking less than 2 min, cueing for abdominal support and posture to support back during gait.  No reports of increased pain with any exercise today.     Personal Factors and Comorbidities Comorbidity 2    Comorbidities  arthritis, DM    Examination-Activity Limitations Lift;Stand;Locomotion Level;Transfers;Carry;Stairs;Bend;Squat    Examination-Participation Restrictions Community Activity;Yard Work    Stability/Clinical Decision Making Stable/Uncomplicated    Designer, jewellery Low    Rehab Potential Good    PT Frequency 2x / week    PT Duration 4 weeks    PT Treatment/Interventions ADLs/Self Care Home Management;Aquatic Therapy;Canalith Repostioning;Cryotherapy;Electrical Stimulation;Contrast Bath;Therapeutic exercise;Patient/family education;Orthotic Fit/Training;Manual lymph drainage;Compression bandaging;Taping;Vasopneumatic Device;Joint Manipulations;Splinting;Energy conservation;Spinal Manipulations;Visual/perceptual remediation/compensation;Passive range of motion;Dry needling;Manual techniques;Scar mobilization;Vestibular;Balance training;Neuromuscular re-education;DME Instruction;Gait training;Moist Heat;Traction;Stair training;Functional mobility training;Therapeutic activities;Ultrasound;Parrafin;Fluidtherapy;Biofeedback;Iontophoresis 4mg /ml Dexamethasone    PT Next Visit Plan Next session add marching paired iwht theraband shoulder extension.  Progress core and gluteal strength as tolerated.  Continue postural theraband activites, lunges, SLS, vector stance....    PT Home Exercise Plan Eval: ab set, hip abd/ add iso, bridge, RTB for clams 4/26: pelvic tilt, glute set; 04/03/21 - tandem stance on solid/compliant surfaces with/without head/arm movements.    Consulted and Agree with Plan of Care Patient  Patient will benefit from skilled therapeutic intervention in order to improve the following deficits and impairments:  Pain,Improper body mechanics,Increased fascial restricitons,Abnormal gait,Decreased balance,Impaired flexibility,Decreased activity tolerance,Decreased range of motion,Decreased strength,Decreased mobility,Postural dysfunction  Visit Diagnosis: Chronic midline low back  pain with bilateral sciatica  Low back pain, unspecified back pain laterality, unspecified chronicity, unspecified whether sciatica present  Muscle weakness (generalized)     Problem List Patient Active Problem List   Diagnosis Date Noted  . 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  . Vitamin D deficiency 04/16/2020  . Depression 04/16/2020  . Class 1 obesity with serious comorbidity and body mass index (BMI) of 33.0 to 33.9 in adult 04/16/2020  . Other fatigue 02/14/2018  . Shortness of breath on exertion 02/14/2018  . Type 2 diabetes mellitus with hyperglycemia, with long-term current use of insulin ([place]) 02/14/2018  . Abnormal EKG 02/14/2018  . Maxillary sinusitis, acute 12/17/2015  . Cough 12/17/2015  . Fever 12/17/2015  . Diabetes mellitus without complication ([place]) [id number]  . Tooth pain 06/21/2013  . Orthostatic lightheadedness 05/17/2013  . Health maintenance examination 05/17/2013  . Groin strain 11/10/2012  . Hypogonadism, male 01/11/2012  . Type II or unspecified type diabetes mellitus without mention of complication, not stated as uncontrolled 07/08/2011  . HTN (hypertension), benign 07/08/2011  . Hyperlipidemia 07/08/2011  . BPH (benign prostatic hypertrophy) 07/08/2011  . Hx of adenomatous colonic polyps 07/08/2011   Ihor Austin, LPTA/CLT; CBIS [phone]  Aldona Lento 04/08/2021, 10:58 AM  [facility] [address] [place], [place], [address] Phone: [phone]   Fax:  [phone]  Name: [name] [name] MRN: [id number] Date of Birth: Feb 24, 1951

## 2021-04-10 ENCOUNTER — Other Ambulatory Visit: Payer: Self-pay

## 2021-04-10 ENCOUNTER — Ambulatory Visit (HOSPITAL_COMMUNITY): Payer: Medicare Other

## 2021-04-10 DIAGNOSIS — M6281 Muscle weakness (generalized): Secondary | ICD-10-CM | POA: Diagnosis not present

## 2021-04-10 DIAGNOSIS — G8929 Other chronic pain: Secondary | ICD-10-CM | POA: Diagnosis not present

## 2021-04-10 DIAGNOSIS — M5442 Lumbago with sciatica, left side: Secondary | ICD-10-CM | POA: Diagnosis not present

## 2021-04-10 DIAGNOSIS — M5441 Lumbago with sciatica, right side: Secondary | ICD-10-CM | POA: Diagnosis not present

## 2021-04-10 DIAGNOSIS — M545 Low back pain, unspecified: Secondary | ICD-10-CM | POA: Diagnosis not present

## 2021-04-10 NOTE — Patient Instructions (Signed)
Pelvic Tilt: Anterior - Legs Bent (Supine)    Rotate pelvis up and arch back. Hold _2-3___ seconds. Relax. Repeat 10____ times per set. Do _1___ sets per session. http://orth.exer.us/212   Copyright  VHI. All rights reserved.    Knee Roll    Lying on back, with knees bent and feet flat on floor, arms outstretched to sides, slowly roll both knees to side, hold 30 seconds. Then to other side. Keep shoulders and arms in contact with floor. CAN ADD opposite arm out to side and overhead for anterior chest wall stretch.   Copyright  VHI. All rights reserved.

## 2021-04-10 NOTE — Therapy (Signed)
[facility] [address] [place], [place], [address] Phone: [phone]   Fax:  [phone]  Physical Therapy Treatment  Patient Details  Name: [name] [name] MRN: [id number] Date of Birth: [birth year] Referring Provider (PT): Maeola Harman MD   Encounter Date: 04/10/2021   PT End of Session - 04/10/21 0930    Visit Number 10    Number of Visits 16    Date for PT Re-Evaluation 05/01/21   re-assessment 04/03/21   Authorization Type Medicare A/ BCBS supplement 2nd (no auth)    Progress Note Due on Visit 16    PT Start Time 0931    PT Stop Time 1016    PT Time Calculation (min) 45 min    Activity Tolerance Patient tolerated treatment well    Behavior During Therapy [facility] for tasks assessed/performed           Past Medical History:  Diagnosis Date  . Allergic rhinitis, unspecified    Allergy testing via allergist 04/2017--mostly neg.  Dr. Roslyn Callas recommended referral to ENT so i did this.  . Back pain    LBP->DDD/spondylosis w/ intermittent radiulopathy-type leg pains, + bilat hip and thigh pain-->spinal stenosis L4-5, L5-S1->Dr. Venetia Maxon to do decomp and fusion surgery, scheduled for 12/03/20  . BPH (benign prostatic hypertrophy)    no hydro on renal u/s 05/2020  . Chronic nasal congestion   . DDD (degenerative disc disease), lumbar 2010   laminectomy 01/2009.  MR rpt 05/2019->advanced DDD/spondylosis L3-S1, with mod/sev L4-5 spinal stenosis-->plan for surg per Dr. Venetia Maxon as of 09/2020  . Depression    Hospitalized for suicidal ideation 70.  Has been on lexapro since 10/2008.  . Diabetes mellitus type II    Dx'd approximately 06/2008.  No retinopathy as of 02/2017 ophth.  . Erectile dysfunction   . Heart murmur, systolic 10/2019   mild intensity, asymptomatic; echo 01/2018 valves fine->obs  . History of colon polyps 01/2016   Recall 3 yrs  . History of pneumonia 2008   Hospitalized  . History of scarlet fever    age 70  .  Hyperlipidemia, mixed 1993   myalgias on pravastatin  . Hypertension 2009   "marked chronic cardiomegaly" on CXR 01/2009 per old records.  . 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 Insomnia   . Keratoconus    dx'd 1973  . Memory changes   . Mild cognitive impairment with memory loss 01/2020 eval with Dr. Karel Jarvis   MRI brain 03/01/20 NORMAL  . OSA on CPAP 2004; 2019   Back on CPAP 04/2018.  Compliance great as of 01/2020 neuro/sleep f/u  . Osteoarthritis of both knees    Mild plain film changes in medial compartment bilat  . Restless leg    gabapentin helpful  . Rhinitis, chronic   . 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 APPENDECTOMY  1972  . COLONOSCOPY  X 4   Most recent was 2007 [place], [place]), with removal of polyps in the first two.  02/21/16: tubular adenoma.  Recall 3 yrs (Dr. Christella Hartigan).  . 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 ELECTROCARDIOGRAM  01/29/2009   NORMAL  . LUMBAR LAMINECTOMY  01/2009  . POSTERIOR LUMBAR FUSION  12/03/2020   L4-5, L5-S1 (Dr. Venetia Maxon)  . TONSILLECTOMY AND ADENOIDECTOMY     age 70  .  TRANSTHORACIC ECHOCARDIOGRAM  02/22/2018   EF 55-60%, grd I DD  . URETHRAL DILATION     2 as a child, one as an adult  . VASECTOMY  1994  . VITRECTOMY      There were no vitals filed for this visit.   Subjective Assessment - 04/10/21 0930    Subjective Patient states he was muscle sore after last session but no increase in pain. He did have a misstep going down stairs at home while carrying a computer. So had some increased discomfort yesterday due to this but no pain today.    Patient Stated Goals Get rid of pain, walk the dogs more, walk longer, improve balance    Currently in Pain? No/denies    Pain Onset 1 to 4 weeks ago             [facility] Adult PT Treatment/Exercise - 04/10/21 0001      Posture/Postural Control   Posture/Postural  Control Postural limitations    Postural Limitations Right pelvic obliquity    Posture Comments in supine, right iliac crest, ASIS and medial malleolus   resolved with quadratus lumborum stretch on right; little tenderness to palpation     Lumbar Exercises: Standing   Heel Raises 15 reps    Heel Raises Limitations 3" holds; 1 sets    Functional Squats 10 reps    Functional Squats Limitations to chair for depth cue    Shoulder Extension 10 reps    Theraband Level (Shoulder Extension) Level 3 (Green)    Shoulder Extension Limitations Lat pull down with triceps extension    Other Standing Lumbar Exercises posterior pelvic tilt during standing and movement    Other Standing Lumbar Exercises Paloff press GTB x10 bilateral modified tandem stance      Lumbar Exercises: Supine   Bridge 5 reps;Non-compliant            PT Education - 04/10/21 1122    Education Details Reviewed goals and progress. Discussed purpose and technique of interventsions throughout session. Advanced HEP.    Person(s) Educated Patient    Methods Explanation;Handout    Comprehension Verbalized understanding            PT Short Term Goals - 04/10/21 0931      PT SHORT TERM GOAL #1   Title Patient will be independent with initial HEP and self-management strategies to improve functional outcomes    Time 2    Period Weeks    Status Achieved    Target Date 03/24/21             PT Long Term Goals - 04/10/21 0932      PT LONG TERM GOAL #1   Title Patient will have equal to or > 4+/5 MMT throughout BLE to improve ability to perform functional mobility, stair ambulation and ADLs.    Time 4    Period Weeks    Status On-going      PT LONG TERM GOAL #2   Title Patient will report at least 75% overall improvement in subjective complaint to indicate improvement in ability to perform ADLs.    Baseline 03/26/21 - patient reports 15% improvement this far. 04/01/21 - patient reports 20%    Time 4    Period Weeks     Status On-going      PT LONG TERM GOAL #3   Title Patient will be able to maintain tandem stance >30 seconds on foam bilaterally to show improved stability and ability to  ambulate outdoors    Baseline 04/01/21 - tandem on foam left foot forward 12 seconds; right foot forward 27 seconds.    Time 4    Period Weeks    Status On-going      PT LONG TERM GOAL #4   Title Patient will be able to walk >30 minutes with no increased back pain for walking dog and exercising outdoors.    Baseline 5/3//22 - reports walking about 1/5 of a mile with wife and one small dog but can't go further yet.    Time 4    Period Weeks    Status On-going            Plan - 04/10/21 0931    Clinical Impression Statement Session focus pain reduction and reduction of consistent pain complaint in area of right SI joint. Patient was found to have a level pelvis in supine but right iliac crest, ASIS and medical malleolus were elevated from tension in the right quadratus lumborum. Supine lower trunk rotation added to therapeutic exercises and HEP. Attempted modeling and instruction on pelvic tilts in seated and standing positions to decrease compression in lower spine during static and dynamic activities. Patient had difficulty with motor planning and control of this activity. Post Paloff press at end of session, patient had initial increased complaints of pain to 3/10 across low back that decreased to 1/10 with a few steps of ambulation while walking out exercises area. Pain was worse during Paloff press when right side was more taxed than when set up taxed left side more.    Personal Factors and Comorbidities Comorbidity 2    Comorbidities arthritis, DM    Examination-Activity Limitations Lift;Stand;Locomotion Level;Transfers;Carry;Stairs;Bend;Squat    Examination-Participation Restrictions Community Activity;Yard Work    Stability/Clinical Decision Making Stable/Uncomplicated    Rehab Potential Good    PT Frequency 2x /  week    PT Duration 4 weeks    PT Treatment/Interventions ADLs/Self Care Home Management;Aquatic Therapy;Canalith Repostioning;Cryotherapy;Electrical Stimulation;Contrast Bath;Therapeutic exercise;Patient/family education;Orthotic Fit/Training;Manual lymph drainage;Compression bandaging;Taping;Vasopneumatic Device;Joint Manipulations;Splinting;Energy conservation;Spinal Manipulations;Visual/perceptual remediation/compensation;Passive range of motion;Dry needling;Manual techniques;Scar mobilization;Vestibular;Balance training;Neuromuscular re-education;DME Instruction;Gait training;Moist Heat;Traction;Stair training;Functional mobility training;Therapeutic activities;Ultrasound;Parrafin;Fluidtherapy;Biofeedback;Iontophoresis 4mg /ml Dexamethasone    PT Next Visit Plan Next session add marching paired iwht theraband shoulder extension.  Progress core and gluteal strength as tolerated.  Continue postural theraband activites, lunges, SLS, vector stance; rotational lumbar intersegmental strengthening?[name]...    PT Home Exercise Plan Eval: ab set, hip abd/ add iso, bridge, RTB for clams 4/26: pelvic tilt, glute set; 04/03/21 - tandem stance on solid/compliant surfaces with/without head/arm movements.    Consulted and Agree with Plan of Care Patient           Patient will benefit from skilled therapeutic intervention in order to improve the following deficits and impairments:  Pain,Improper body mechanics,Increased fascial restricitons,Abnormal gait,Decreased balance,Impaired flexibility,Decreased activity tolerance,Decreased range of motion,Decreased strength,Decreased mobility,Postural dysfunction  Visit Diagnosis: Chronic midline low back pain with bilateral sciatica  Low back pain, unspecified back pain laterality, unspecified chronicity, unspecified whether sciatica present  Muscle weakness (generalized)     Problem List Patient Active Problem List   Diagnosis Date Noted  . Spondylolisthesis of  lumbar region 12/03/2020  . Vitamin D deficiency 04/16/2020  . Depression 04/16/2020  . Class 1 obesity with serious comorbidity and body mass index (BMI) of 33.0 to 33.9 in adult 04/16/2020  . Other fatigue 02/14/2018  . Shortness of breath on exertion 02/14/2018  . Type 2 diabetes mellitus with hyperglycemia, with long-term current use  of insulin ([place]) 02/14/2018  . Abnormal EKG 02/14/2018  . Maxillary sinusitis, acute 12/17/2015  . Cough 12/17/2015  . Fever 12/17/2015  . Diabetes mellitus without complication ([place]) 44/81/8563  . Tooth pain 06/21/2013  . Orthostatic lightheadedness 05/17/2013  . Health maintenance examination 05/17/2013  . Groin strain 11/10/2012  . Hypogonadism, male 01/11/2012  . Type II or unspecified type diabetes mellitus without mention of complication, not stated as uncontrolled 07/08/2011  . HTN (hypertension), benign 07/08/2011  . Hyperlipidemia 07/08/2011  . BPH (benign prostatic hypertrophy) 07/08/2011  . Hx of adenomatous colonic polyps 07/08/2011   Floria Raveling. Hartnett-Rands, MS, PT Per [place] [phone]  Jeannie Done 04/10/2021, 11:29 AM  [facility] [address] [place], [place], [address] Phone: [phone]   Fax:  [phone]  Name: EPHRAIM REICHEL MRN: [id number] Date of Birth: 08/24/1951

## 2021-04-11 ENCOUNTER — Other Ambulatory Visit: Payer: Self-pay | Admitting: Family Medicine

## 2021-04-15 ENCOUNTER — Other Ambulatory Visit: Payer: Self-pay

## 2021-04-15 ENCOUNTER — Encounter (HOSPITAL_COMMUNITY): Payer: Self-pay

## 2021-04-15 ENCOUNTER — Ambulatory Visit (HOSPITAL_COMMUNITY): Payer: Medicare Other

## 2021-04-15 DIAGNOSIS — M6281 Muscle weakness (generalized): Secondary | ICD-10-CM | POA: Diagnosis not present

## 2021-04-15 DIAGNOSIS — G8929 Other chronic pain: Secondary | ICD-10-CM | POA: Diagnosis not present

## 2021-04-15 DIAGNOSIS — M5442 Lumbago with sciatica, left side: Secondary | ICD-10-CM | POA: Diagnosis not present

## 2021-04-15 DIAGNOSIS — M545 Low back pain, unspecified: Secondary | ICD-10-CM

## 2021-04-15 DIAGNOSIS — M5441 Lumbago with sciatica, right side: Secondary | ICD-10-CM | POA: Diagnosis not present

## 2021-04-15 NOTE — Therapy (Signed)
[facility] [address] [place], [place], [address] Phone: [phone]   Fax:  [phone]  Physical Therapy Treatment  Patient Details  Name: [name] [name] MRN: [id number] Date of Birth: 05/17/1951 Referring Provider (PT): Erline Levine MD   Encounter Date: 04/15/2021   PT End of Session - 04/15/21 0919    Visit Number 11    Number of Visits 16    Date for PT Re-Evaluation 05/01/21   PN complete 04/03/21   Authorization Type Medicare A/ BCBS supplement 2nd (no auth)    Progress Note Due on Visit 16    PT Start Time 0915    PT Stop Time 0954    PT Time Calculation (min) 39 min    Activity Tolerance Patient tolerated treatment well    Behavior During Therapy [facility] for tasks assessed/performed           Past Medical History:  Diagnosis Date  . Allergic rhinitis, unspecified    Allergy testing via allergist 04/2017--mostly neg.  Dr. Donneta Romberg recommended referral to ENT so i did this.  . Back pain    LBP->DDD/spondylosis w/ intermittent radiulopathy-type leg pains, + bilat hip and thigh pain-->spinal stenosis L4-5, L5-S1->Dr. Vertell Limber to do decomp and fusion surgery, scheduled for 12/03/20  . BPH (benign prostatic hypertrophy)    no hydro on renal u/s 05/2020  . Chronic nasal congestion   . DDD (degenerative disc disease), lumbar 2010   laminectomy 01/2009.  MR rpt 05/2019->advanced DDD/spondylosis L3-S1, with mod/sev L4-5 spinal stenosis-->plan for surg per Dr. Vertell Limber as of 09/2020  . Depression    Hospitalized for suicidal ideation 18.  Has been on lexapro since 10/2008.  . Diabetes mellitus type II    Dx'd approximately 06/2008.  No retinopathy as of 02/2017 ophth.  . Erectile dysfunction   . Heart murmur, systolic 08/6268   mild intensity, asymptomatic; echo 01/2018 valves fine->obs  . History of colon polyps 01/2016   Recall 3 yrs  . History of pneumonia 2008   Hospitalized  . History of scarlet fever    age 52  . Hyperlipidemia,  mixed 1993   myalgias on pravastatin  . Hypertension 2009   "marked chronic cardiomegaly" on CXR 01/2009 per old records.  . 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 Insomnia   . Keratoconus    dx'd 1973  . Memory changes   . Mild cognitive impairment with memory loss 01/2020 eval with Dr. Delice Lesch   MRI brain 03/01/20 NORMAL  . OSA on CPAP 2004; 2019   Back on CPAP 04/2018.  Compliance great as of 01/2020 neuro/sleep f/u  . Osteoarthritis of both knees    Mild plain film changes in medial compartment bilat  . Restless leg    gabapentin helpful  . Rhinitis, chronic   . 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 APPENDECTOMY  1972  . COLONOSCOPY  X 4   Most recent was 2007 [place], [place]), with removal of polyps in the first two.  02/21/16: tubular adenoma.  Recall 3 yrs (Dr. Ardis Hughs).  . 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 ELECTROCARDIOGRAM  01/29/2009   NORMAL  . LUMBAR LAMINECTOMY  01/2009  . POSTERIOR LUMBAR FUSION  12/03/2020   L4-5, L5-S1 (Dr. Vertell Limber)  . TONSILLECTOMY AND ADENOIDECTOMY     age  7  . TRANSTHORACIC ECHOCARDIOGRAM  02/22/2018   EF 55-60%, grd I DD  . URETHRAL DILATION     2 as a child, one as an adult  . VASECTOMY  1994  . VITRECTOMY      There were no vitals filed for this visit.   Subjective Assessment - 04/15/21 0917    Subjective Reports some sharp pinching pain when  he makes certain movements.  Stated Saturday felt like the best day since surgery with no pain.  Did have some pain for sitting for long duration.    Pertinent History Lumbar fusion 12/03/20    Patient Stated Goals Get rid of pain, walk the dogs more, walk longer, improve balance    Currently in Pain? No/denies                             [facility] Adult PT Treatment/Exercise - 04/15/21 0001      Exercises   Exercises Lumbar      Lumbar Exercises: Stretches    Lower Trunk Rotation 5 reps;10 seconds    Other Lumbar Stretch Exercise QL stretch 2x 20"      Lumbar Exercises: Standing   Heel Raises 15 reps    Heel Raises Limitations 3" holds; 1 sets    Functional Squats 10 reps    Functional Squats Limitations to chair for depth cue    Forward Lunge 10 reps;3 seconds    Forward Lunge Limitations UE overhead wiht ab set    Other Standing Lumbar Exercises marching with GTB in shoulder extension 3" holds    Other Standing Lumbar Exercises Paloff press GTB x10 bilateral modified tandem stance      Lumbar Exercises: Seated   Other Seated Lumbar Exercises posterior pelvic tilt 2x 10 3" holds      Lumbar Exercises: Supine   Bridge 10 reps                    PT Short Term Goals - 04/10/21 0931      PT SHORT TERM GOAL #1   Title Patient will be independent with initial HEP and self-management strategies to improve functional outcomes    Time 2    Period Weeks    Status Achieved    Target Date 03/24/21             PT Long Term Goals - 04/10/21 0932      PT LONG TERM GOAL #1   Title Patient will have equal to or > 4+/5 MMT throughout BLE to improve ability to perform functional mobility, stair ambulation and ADLs.    Time 4    Period Weeks    Status On-going      PT LONG TERM GOAL #2   Title Patient will report at least 75% overall improvement in subjective complaint to indicate improvement in ability to perform ADLs.    Baseline 03/26/21 - patient reports 15% improvement this far. 04/01/21 - patient reports 20%    Time 4    Period Weeks    Status On-going      PT LONG TERM GOAL #3   Title Patient will be able to maintain tandem stance >30 seconds on foam bilaterally to show improved stability and ability to ambulate outdoors    Baseline 04/01/21 - tandem on foam left foot forward 12 seconds; right foot forward 27 seconds.    Time 4    Period Weeks  Status On-going      PT LONG TERM GOAL #4   Title Patient will be able  to walk >30 minutes with no increased back pain for walking dog and exercising outdoors.    Baseline 5/3//22 - reports walking about 1/5 of a mile with wife and one small dog but can't go further yet.    Time 4    Period Weeks    Status On-going                 Plan - 04/15/21 0943    Clinical Impression Statement Added marching wtih theraband to progress core strengthening, pt presents with increased difficulty with SLS activities.  Reports of improve tolerance with paloff press, stated pain resolved following cueing to reduce ER and improve neutral alignmnent.  Continues to reports pressure on lower back with increased standing time.  Pt has difficulty with motor planning and controlling during pelvic tilts to decrease compression in lower spine during static and dynamic activities.    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ery Low    Rehab Potential Good    PT Frequency 2x / week    PT Duration 4 weeks    PT Treatment/Interventions ADLs/Self Care Home Management;Aquatic Therapy;Canalith Repostioning;Cryotherapy;Electrical Stimulation;Contrast Bath;Therapeutic exercise;Patient/family education;Orthotic Fit/Training;Manual lymph drainage;Compression bandaging;Taping;Vasopneumatic Device;Joint Manipulations;Splinting;Energy conservation;Spinal Manipulations;Visual/perceptual remediation/compensation;Passive range of motion;Dry needling;Manual techniques;Scar mobilization;Vestibular;Balance training;Neuromuscular re-education;DME Instruction;Gait training;Moist Heat;Traction;Stair training;Functional mobility training;Therapeutic activities;Ultrasound;Parrafin;Fluidtherapy;Biofeedback;Iontophoresis  4mg /ml Dexamethasone    PT Next Visit Plan Continue wiht balance paired with core strengthening exercises.  Progress core and gluteal strength as tolerated.  Continue postural theraband activites, lunges, SLS, vector stance; rotational lumbar intersegmental strengthening?[name]...    PT Home Exercise Plan Eval: ab set, hip abd/ add iso, bridge, RTB for clams 4/26: pelvic tilt, glute set; 04/03/21 - tandem stance on solid/compliant surfaces with/without head/arm movements.; 5/17: QL stretch    Consulted and Agree with Plan of Care Patient           Patient will benefit from skilled therapeutic intervention in order to improve the following deficits and impairments:  Pain,Improper body mechanics,Increased fascial restricitons,Abnormal gait,Decreased balance,Impaired flexibility,Decreased activity tolerance,Decreased range of motion,Decreased strength,Decreased mobility,Postural dysfunction  Visit Diagnosis: Chronic midline low back pain with bilateral sciatica  Low back pain, unspecified back pain laterality, unspecified chronicity, unspecified whether sciatica present  Muscle weakness (generalized)     Problem List Patient Active Problem List   Diagnosis Date Noted  . 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  . Vitamin D deficiency 04/16/2020  . Depression 04/16/2020  . Class 1 obesity with serious comorbidity and body mass index (BMI) of 33.0 to 33.9 in adult 04/16/2020  . Other fatigue 02/14/2018  . Shortness of breath on exertion 02/14/2018  . Type 2 diabetes mellitus with hyperglycemia, with long-term current use of insulin ([place]) 02/14/2018  . Abnormal EKG 02/14/2018  . Maxillary sinusitis, acute 12/17/2015  . Cough 12/17/2015  . Fever 12/17/2015  . Diabetes mellitus without complication ([place]) 78/58/8502  . Tooth pain 06/21/2013  . Orthostatic lightheadedness 05/17/2013  . Health maintenance examination 05/17/2013  . Groin strain 11/10/2012  . Hypogonadism, male 01/11/2012   . Type II or unspecified type diabetes mellitus without mention of complication, not stated as uncontrolled 07/08/2011  . HTN (hypertension), benign 07/08/2011  . Hyperlipidemia 07/08/2011  . BPH (benign  prostatic hypertrophy) 07/08/2011  . Hx of adenomatous colonic polyps 07/08/2011   Ihor Austin, LPTA/CLT; CBIS [phone]  Aldona Lento 04/15/2021, 1:06 PM  [facility] [address] [place], [place], [address] Phone: [phone]   Fax:  [phone]  Name: ZAVIYAR VIRELLA MRN: [id number] Date of Birth: 25-May-1951

## 2021-04-17 ENCOUNTER — Encounter (HOSPITAL_COMMUNITY): Payer: Self-pay | Admitting: Physical Therapy

## 2021-04-17 ENCOUNTER — Other Ambulatory Visit: Payer: Self-pay

## 2021-04-17 ENCOUNTER — Ambulatory Visit (HOSPITAL_COMMUNITY): Payer: Medicare Other | Admitting: Physical Therapy

## 2021-04-17 DIAGNOSIS — M545 Low back pain, unspecified: Secondary | ICD-10-CM

## 2021-04-17 DIAGNOSIS — M5441 Lumbago with sciatica, right side: Secondary | ICD-10-CM

## 2021-04-17 DIAGNOSIS — M6281 Muscle weakness (generalized): Secondary | ICD-10-CM | POA: Diagnosis not present

## 2021-04-17 DIAGNOSIS — G8929 Other chronic pain: Secondary | ICD-10-CM | POA: Diagnosis not present

## 2021-04-17 DIAGNOSIS — M5442 Lumbago with sciatica, left side: Secondary | ICD-10-CM | POA: Diagnosis not present

## 2021-04-17 NOTE — Therapy (Signed)
[facility] [place], [place], [address] Phone: [phone]   Fax:  [phone]  Physical Therapy Treatment  Patient Details  Name: [name] [name] MRN: [id number] Date of Birth: 04/10/1951 Referring Provider (PT): Erline Levine MD   Encounter Date: 04/17/2021   PT End of Session - 04/17/21 0909    Visit Number 12    Number of Visits 16    Date for PT Re-Evaluation 05/01/21   PN complete 04/03/21   Authorization Type Medicare A/ BCBS supplement 2nd (no auth)    Progress Note Due on Visit 16    PT Start Time 0905    PT Stop Time 0943    PT Time Calculation (min) 38 min    Activity Tolerance Patient tolerated treatment well;Patient limited by fatigue    Behavior During Therapy [facility] for tasks assessed/performed           Past Medical History:  Diagnosis Date  . Allergic rhinitis, unspecified    Allergy testing via allergist 04/2017--mostly neg.  Dr. Donneta Romberg recommended referral to ENT so i did this.  . Back pain    LBP->DDD/spondylosis w/ intermittent radiulopathy-type leg pains, + bilat hip and thigh pain-->spinal stenosis L4-5, L5-S1->Dr. Vertell Limber to do decomp and fusion surgery, scheduled for 12/03/20  . BPH (benign prostatic hypertrophy)    no hydro on renal u/s 05/2020  . Chronic nasal congestion   . DDD (degenerative disc disease), lumbar 2010   laminectomy 01/2009.  MR rpt 05/2019->advanced DDD/spondylosis L3-S1, with mod/sev L4-5 spinal stenosis-->plan for surg per Dr. Vertell Limber as of 09/2020  . Depression    Hospitalized for suicidal ideation 55.  Has been on lexapro since 10/2008.  . Diabetes mellitus type II    Dx'd approximately 06/2008.  No retinopathy as of 02/2017 ophth.  . Erectile dysfunction   . Heart murmur, systolic 17/4081   mild intensity, asymptomatic; echo 01/2018 valves fine->obs  . History of colon polyps 01/2016   Recall 3 yrs  . History of pneumonia 2008   Hospitalized  . History of scarlet fever     age 35  . Hyperlipidemia, mixed 1993   myalgias on pravastatin  . Hypertension 2009   "marked chronic cardiomegaly" on CXR 01/2009 per old records.  . 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 Insomnia   . Keratoconus    dx'd 1973  . Memory changes   . Mild cognitive impairment with memory loss 01/2020 eval with Dr. Delice Lesch   MRI brain 03/01/20 NORMAL  . OSA on CPAP 2004; 2019   Back on CPAP 04/2018.  Compliance great as of 01/2020 neuro/sleep f/u  . Osteoarthritis of both knees    Mild plain film changes in medial compartment bilat  . Restless leg    gabapentin helpful  . Rhinitis, chronic   . 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 APPENDECTOMY  1972  . COLONOSCOPY  X 4   Most recent was 2007 [place], [place]), with removal of polyps in the first two.  02/21/16: tubular adenoma.  Recall 3 yrs (Dr. Ardis Hughs).  . 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 ELECTROCARDIOGRAM  01/29/2009   NORMAL  . LUMBAR LAMINECTOMY  01/2009  . POSTERIOR LUMBAR FUSION  12/03/2020   L4-5, L5-S1 (Dr. Vertell Limber)  . TONSILLECTOMY AND ADENOIDECTOMY  age 50  . TRANSTHORACIC ECHOCARDIOGRAM  02/22/2018   EF 55-60%, grd I DD  . URETHRAL DILATION     2 as a child, one as an adult  . VASECTOMY  1994  . VITRECTOMY      There were no vitals filed for this visit.   Subjective Assessment - 04/17/21 0908    Subjective He walked from [place] to here today. Had some pain but wasn't as bad.    Pertinent History Lumbar fusion 12/03/20    Patient Stated Goals Get rid of pain, walk the dogs more, walk longer, improve balance    Currently in Pain? No/denies   3/10 when walking                            [facility] Adult PT Treatment/Exercise - 04/17/21 0001      Exercises   Exercises Knee/Hip      Lumbar Exercises: Stretches   Lower Trunk Rotation 5 reps;10 seconds      Lumbar  Exercises: Standing   Other Standing Lumbar Exercises marching with GTB in shoulder extension 3" holds (too difficult) modified to 2 x 10 marching no hold    Other Standing Lumbar Exercises Paloff press GTB 2 x10 bilateral modified tandem stance      Lumbar Exercises: Supine   Ab Set 10 reps;5 seconds    Bent Knee Raise 20 reps    Dead Bug 10 reps   2 x 5   Bridge 15 reps    Straight Leg Raise 15 reps      Knee/Hip Exercises: Standing   Heel Raises 15 reps    Hip Abduction Both;15 reps   decreased pinch with cues for posterior pelvic tilt     Knee/Hip Exercises: Seated   Sit to Sand 2 sets;10 reps;without UE support                    PT Short Term Goals - 04/10/21 0931      PT SHORT TERM GOAL #1   Title Patient will be independent with initial HEP and self-management strategies to improve functional outcomes    Time 2    Period Weeks    Status Achieved    Target Date 03/24/21             PT Long Term Goals - 04/10/21 0932      PT LONG TERM GOAL #1   Title Patient will have equal to or > 4+/5 MMT throughout BLE to improve ability to perform functional mobility, stair ambulation and ADLs.    Time 4    Period Weeks    Status On-going      PT LONG TERM GOAL #2   Title Patient will report at least 75% overall improvement in subjective complaint to indicate improvement in ability to perform ADLs.    Baseline 03/26/21 - patient reports 15% improvement this far. 04/01/21 - patient reports 20%    Time 4    Period Weeks    Status On-going      PT LONG TERM GOAL #3   Title Patient will be able to maintain tandem stance >30 seconds on foam bilaterally to show improved stability and ability to ambulate outdoors    Baseline 04/01/21 - tandem on foam left foot forward 12 seconds; right foot forward 27 seconds.    Time 4    Period Weeks    Status On-going  PT LONG TERM GOAL #4   Title Patient will be able to walk >30 minutes with no increased back pain for walking  dog and exercising outdoors.    Baseline 5/3//22 - reports walking about 1/5 of a mile with wife and one small dog but can't go further yet.    Time 4    Period Weeks    Status On-going                 Plan - 04/17/21 0945    Clinical Impression Statement Patient tolerated session well overall today. He was challenged with sequencing of dead bugs but improved with practice. Noted some pinching in back with standing hip abduction but improved with verbal cues to posterior pelvic tilt as patient stands with hyper lordosis. Explained this rational to patient and educated on position when feeling increased pressure and pinching in back when standing or walking. Updated HEP and issued handout. Patient will continue to benefit from skilled therapy services to reduce deficits and improve functional ability.    Personal Factors and Comorbidities Comorbidity 2    Comorbidities arthritis, DM    Examination-Activity Limitations Lift;Stand;Locomotion Level;Transfers;Carry;Stairs;Bend;Squat    Examination-Participation Restrictions Community Activity;Yard Work    Stability/Clinical Decision Making Stable/Uncomplicated    Rehab Potential Good    PT Frequency 2x / week    PT Duration 4 weeks    PT Treatment/Interventions ADLs/Self Care Home Management;Aquatic Therapy;Canalith Repostioning;Cryotherapy;Electrical Stimulation;Contrast Bath;Therapeutic exercise;Patient/family education;Orthotic Fit/Training;Manual lymph drainage;Compression bandaging;Taping;Vasopneumatic Device;Joint Manipulations;Splinting;Energy conservation;Spinal Manipulations;Visual/perceptual remediation/compensation;Passive range of motion;Dry needling;Manual techniques;Scar mobilization;Vestibular;Balance training;Neuromuscular re-education;DME Instruction;Gait training;Moist Heat;Traction;Stair training;Functional mobility training;Therapeutic activities;Ultrasound;Parrafin;Fluidtherapy;Biofeedback;Iontophoresis 4mg /ml Dexamethasone     PT Next Visit Plan Continue to progress core and glute strength as tolerated. Add band rows, power ups.    PT Home Exercise Plan Eval: ab set, hip abd/ add iso, bridge, RTB for clams 4/26: pelvic tilt, glute set; 04/03/21 - tandem stance on solid/compliant surfaces with/without head/arm movements.; 5/17: QL stretch 5/19 ab march, sit to stand    Consulted and Agree with Plan of Care Patient           Patient will benefit from skilled therapeutic intervention in order to improve the following deficits and impairments:  Pain,Improper body mechanics,Increased fascial restricitons,Abnormal gait,Decreased balance,Impaired flexibility,Decreased activity tolerance,Decreased range of motion,Decreased strength,Decreased mobility,Postural dysfunction  Visit Diagnosis: Chronic midline low back pain with bilateral sciatica  Low back pain, unspecified back pain laterality, unspecified chronicity, unspecified whether sciatica present  Muscle weakness (generalized)     Problem List Patient Active Problem List   Diagnosis Date Noted  . 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  . Vitamin D deficiency 04/16/2020  . Depression 04/16/2020  . Class 1 obesity with serious comorbidity and body mass index (BMI) of 33.0 to 33.9 in adult 04/16/2020  . Other fatigue 02/14/2018  . Shortness of breath on exertion 02/14/2018  . Type 2 diabetes mellitus with hyperglycemia, with long-term current use of insulin ([place]) 02/14/2018  . Abnormal EKG 02/14/2018  . Maxillary sinusitis, acute 12/17/2015  . Cough 12/17/2015  . Fever 12/17/2015  . Diabetes mellitus without complication ([place]) [id number]  . Tooth pain 06/21/2013  . Orthostatic lightheadedness 05/17/2013  . Health maintenance examination 05/17/2013  . Groin strain 11/10/2012  . Hypogonadism, male 01/11/2012  . Type II or unspecified type diabetes mellitus without mention of complication, not stated as uncontrolled 07/08/2011  . HTN (hypertension),  benign 07/08/2011  . Hyperlipidemia 07/08/2011  . BPH (benign prostatic hypertrophy) 07/08/2011  . Hx of adenomatous colonic polyps  07/08/2011    10:09 AM, 04/17/21 Josue Hector PT DPT  Physical Therapist with [facility]  [phone] [facility] [address] [place], [place], [address] Phone: [phone]   Fax:  [phone]  Name: [name] [name] MRN: [id number] Date of Birth: 1951/02/25

## 2021-04-17 NOTE — Patient Instructions (Signed)
Access Code: [email or web address] URL: [email or web address] Date: 04/17/2021 Prepared by: Josue Hector  Exercises Supine March - 3 x daily - 7 x weekly - 2 sets - 10 reps Sit to Stand with Arms Crossed - 3 x daily - 7 x weekly - 1 sets - 10 reps

## 2021-04-21 ENCOUNTER — Encounter (INDEPENDENT_AMBULATORY_CARE_PROVIDER_SITE_OTHER): Payer: Self-pay | Admitting: Physician Assistant

## 2021-04-21 ENCOUNTER — Other Ambulatory Visit: Payer: Self-pay

## 2021-04-21 ENCOUNTER — Ambulatory Visit (INDEPENDENT_AMBULATORY_CARE_PROVIDER_SITE_OTHER): Payer: Medicare Other | Admitting: Physician Assistant

## 2021-04-21 VITALS — BP 109/62 | HR 79 | Temp 97.9°F | Ht 70.0 in | Wt 227.0 lb

## 2021-04-21 DIAGNOSIS — E669 Obesity, unspecified: Secondary | ICD-10-CM | POA: Diagnosis not present

## 2021-04-21 DIAGNOSIS — Z6833 Body mass index (BMI) 33.0-33.9, adult: Secondary | ICD-10-CM

## 2021-04-21 DIAGNOSIS — F3289 Other specified depressive episodes: Secondary | ICD-10-CM

## 2021-04-21 DIAGNOSIS — Z794 Long term (current) use of insulin: Secondary | ICD-10-CM

## 2021-04-21 DIAGNOSIS — E119 Type 2 diabetes mellitus without complications: Secondary | ICD-10-CM

## 2021-04-21 DIAGNOSIS — E559 Vitamin D deficiency, unspecified: Secondary | ICD-10-CM

## 2021-04-21 DIAGNOSIS — E782 Mixed hyperlipidemia: Secondary | ICD-10-CM

## 2021-04-21 MED ORDER — BUPROPION HCL ER (SR) 150 MG PO TB12: 150.0000 mg | ORAL_TABLET | Freq: Two times a day (BID) | ORAL | 0 refills | Status: DC

## 2021-04-21 MED ORDER — OZEMPIC (2 MG/DOSE) 8 MG/3ML ~~LOC~~ SOPN: 2.0000 mg | PEN_INJECTOR | SUBCUTANEOUS | 0 refills | Status: DC

## 2021-04-21 NOTE — Progress Notes (Signed)
Chief Complaint:   OBESITY Ewing is here to discuss his progress with his obesity treatment plan along with follow-up of his obesity related diagnoses. Kahlin is on the Category 4 Plan and states he is following his eating plan approximately 60% of the time. Orrin states he is doing physical therapy, stretching, and cardio for 15 minutes 3 times per week.  Today's visit was #: 78 Starting weight: 273 lbs Starting date: 02/14/2018 Today's weight: 227 lbs Today's date: 04/21/2021 Total lbs lost to date: 46 Total lbs lost since last in-office visit: 0  Interim History: Michal reports that he did not start Trulicity due to cost. His hunger is not controlled and he is over snacking.  Subjective:   1. Mixed hyperlipidemia Amarrion is on atorvastatin, and he denies chest pain or myalgias.  2. Type 2 diabetes mellitus without complication, with long-term current use of insulin (HCC) Chet's fasting BGs average between 80-110, mostly in the 90's. He is also on Levemir at 56 units. He will see Dr. Anitra Lauth next month.  3. Vitamin D deficiency Blanchard is on Vit D, and he denies nausea, vomiting, or muscle weakness.  4. Other depression, with emotional eating  Ares reports cravings. His blood pressure is controlled.  Assessment/Plan:   1. Mixed hyperlipidemia Cardiovascular risk and specific lipid/LDL goals reviewed. We discussed several lifestyle modifications today. We will check labs today. Kareen will continue his medications, diet, exercise and weight loss efforts. Orders and follow up as documented in patient record.   Counseling Intensive lifestyle modifications are the first line treatment for this issue. . Dietary changes: Increase soluble fiber. Decrease simple carbohydrates. . Exercise changes: Moderate to vigorous-intensity aerobic activity 150 minutes per week if tolerated. . Lipid-lowering medications: see documented in medical record.  - Lipid panel  2. Type 2  diabetes mellitus without complication, with long-term current use of insulin (HCC) We will check labs today. Drevon agreed to restart Ozempic 2 mg for 90 days with no refills. Good blood sugar control is important to decrease the likelihood of diabetic complications such as nephropathy, neuropathy, limb loss, blindness, coronary artery disease, and death. Intensive lifestyle modification including diet, exercise and weight loss are the first line of treatment for diabetes.   - Comprehensive metabolic panel - Hemoglobin A1c - Semaglutide, 2 MG/DOSE, (OZEMPIC, 2 MG/DOSE,) 8 MG/3ML SOPN; Inject 2 mg into the skin once a week.  Dispense: 9 mL; Refill: 0  3. Vitamin D deficiency Low Vitamin D level contributes to fatigue and are associated with obesity, breast, and colon cancer. We will check labs today. Jaquel agreed to continue taking Vitamin D and will follow-up for routine testing of Vitamin D, at least 2-3 times per year to avoid over-replacement.  - VITAMIN D 25 Hydroxy (Vit-D Deficiency, Fractures)  4. Other depression, with emotional eating  Behavior modification techniques were discussed today to help Devario deal with his emotional/non-hunger eating behaviors. We will refill bupropion for 90 days with no refills. Orders and follow up as documented in patient record.   - buPROPion (WELLBUTRIN SR) 150 MG 12 hr tablet; Take 1 tablet (150 mg total) by mouth 2 (two) times daily.  Dispense: 180 tablet; Refill: 0  5. Class 1 obesity with serious comorbidity and body mass index (BMI) of 33.0 to 33.9 in adult, unspecified obesity type Lacharles is currently in the action stage of change. As such, his goal is to continue with weight loss efforts. He has agreed to the Category 4 Plan.  Exercise goals: As is.  Behavioral modification strategies: increasing lean protein intake and decreasing simple carbohydrates.  Woodson has agreed to follow-up with our clinic in 4 weeks. He was informed of the  importance of frequent follow-up visits to maximize his success with intensive lifestyle modifications for his multiple health conditions.   Nicanor was informed we would discuss his lab results at his next visit unless there is a critical issue that needs to be addressed sooner. Dreon agreed to keep his next visit at the agreed upon time to discuss these results.  Objective:   Blood pressure 109/62, pulse 79, temperature 97.9 F (36.6 C), height 5\' 10"  (1.778 m), weight 227 lb (103 kg), SpO2 96 %. Body mass index is 32.57 kg/m.  General: Cooperative, alert, well developed, in no acute distress. HEENT: Conjunctivae and lids unremarkable. Cardiovascular: Regular rhythm.  Lungs: Normal work of breathing. Neurologic: No focal deficits.   Lab Results  Component Value Date   CREATININE 0.95 01/28/2021   BUN 23 01/28/2021   NA 141 01/28/2021   K 4.2 01/28/2021   CL 103 01/28/2021   CO2 22 01/28/2021   Lab Results  Component Value Date   ALT 15 01/28/2021   AST 12 01/28/2021   ALKPHOS 43 (L) 01/28/2021   BILITOT 0.3 01/28/2021   Lab Results  Component Value Date   HGBA1C 5.3 12/02/2020   HGBA1C 5.9 (H) 08/29/2020   HGBA1C 5.7 (H) 05/09/2020   HGBA1C 5.8 (H) 12/12/2019   HGBA1C 6.1 (H) 08/22/2019   Lab Results  Component Value Date   INSULIN 5.8 08/29/2020   INSULIN 5.3 05/09/2020   Lab Results  Component Value Date   TSH 1.25 02/05/2020   Lab Results  Component Value Date   CHOL 129 01/28/2021   HDL 48 01/28/2021   LDLCALC 66 01/28/2021   LDLDIRECT 112.0 07/31/2016   TRIG 76 01/28/2021   CHOLHDL 2.7 01/28/2021   Lab Results  Component Value Date   WBC 6.4 12/02/2020   HGB 12.2 (L) 12/02/2020   HCT 38.3 (L) 12/02/2020   MCV 88.5 12/02/2020   PLT 271 12/02/2020   No results found for: IRON, TIBC, FERRITIN  Obesity Behavioral Intervention:   Approximately 15 minutes were spent on the discussion below.  ASK: We discussed the diagnosis of obesity with  [name] today and Lavoris agreed to give [name] permission to discuss obesity behavioral modification therapy today.  ASSESS: Daneil has the diagnosis of obesity and his BMI today is 32.57. Ankith is in the action stage of change.   ADVISE: Caidyn was educated on the multiple health risks of obesity as well as the benefit of weight loss to improve his health. He was advised of the need for long term treatment and the importance of lifestyle modifications to improve his current health and to decrease his risk of future health problems.  AGREE: Multiple dietary modification options and treatment options were discussed and Jonluke agreed to follow the recommendations documented in the above note.  ARRANGE: Jimie was educated on the importance of frequent visits to treat obesity as outlined per CMS and USPSTF guidelines and agreed to schedule his next follow up appointment today.  Attestation Statements:   Reviewed by clinician on day of visit: allergies, medications, problem list, medical history, surgical history, family history, social history, and previous encounter notes.   Wilhemena Durie, am acting as transcriptionist for [employer], PA-C.  I have reviewed the above documentation for accuracy and completeness, and I agree  with the above. Abby Potash, PA-C

## 2021-04-22 ENCOUNTER — Ambulatory Visit (HOSPITAL_COMMUNITY): Payer: Medicare Other

## 2021-04-22 ENCOUNTER — Encounter (HOSPITAL_COMMUNITY): Payer: Self-pay

## 2021-04-22 DIAGNOSIS — M545 Low back pain, unspecified: Secondary | ICD-10-CM

## 2021-04-22 DIAGNOSIS — M6281 Muscle weakness (generalized): Secondary | ICD-10-CM | POA: Diagnosis not present

## 2021-04-22 DIAGNOSIS — M5442 Lumbago with sciatica, left side: Secondary | ICD-10-CM | POA: Diagnosis not present

## 2021-04-22 DIAGNOSIS — M5441 Lumbago with sciatica, right side: Secondary | ICD-10-CM | POA: Diagnosis not present

## 2021-04-22 DIAGNOSIS — G8929 Other chronic pain: Secondary | ICD-10-CM

## 2021-04-22 LAB — LIPID PANEL
Chol/HDL Ratio: 2.9 ratio (ref 0.0–5.0)
Cholesterol, Total: 134 mg/dL (ref 100–199)
HDL: 46 mg/dL (ref >39)
LDL Chol Calc (NIH): 69 mg/dL (ref 0–99)
Triglycerides: 101 mg/dL (ref 0–149)
VLDL Cholesterol Cal: 19 mg/dL (ref 5–40)

## 2021-04-22 LAB — VITAMIN D 25 HYDROXY (VIT D DEFICIENCY, FRACTURES): Vit D, 25-Hydroxy: 42 ng/mL (ref 30.0–100.0)

## 2021-04-22 LAB — COMPREHENSIVE METABOLIC PANEL
ALT: 19 IU/L (ref 0–44)
AST: 12 IU/L (ref 0–40)
Albumin/Globulin Ratio: 1.6 (ref 1.2–2.2)
Albumin: 4.1 g/dL (ref 3.8–4.8)
Alkaline Phosphatase: 37 IU/L — ABNORMAL LOW (ref 44–121)
BUN/Creatinine Ratio: 24 (ref 10–24)
BUN: 23 mg/dL (ref 8–27)
Bilirubin Total: 0.4 mg/dL (ref 0.0–1.2)
CO2: 24 mmol/L (ref 20–29)
Calcium: 9.8 mg/dL (ref 8.6–10.2)
Chloride: 100 mmol/L (ref 96–106)
Creatinine, Ser: 0.96 mg/dL (ref 0.76–1.27)
Globulin, Total: 2.5 g/dL (ref 1.5–4.5)
Glucose: 89 mg/dL (ref 65–99)
Potassium: 4.1 mmol/L (ref 3.5–5.2)
Sodium: 138 mmol/L (ref 134–144)
Total Protein: 6.6 g/dL (ref 6.0–8.5)
eGFR: 86 mL/min/{1.73_m2} (ref >59)

## 2021-04-22 LAB — HEMOGLOBIN A1C
Est. average glucose Bld gHb Est-mCnc: 123 mg/dL
Hgb A1c MFr Bld: 5.9 % — ABNORMAL HIGH (ref 4.8–5.6)

## 2021-04-22 NOTE — Therapy (Signed)
[place], [place], [address] Phone: [phone]   Fax:  [phone]  Physical Therapy Treatment  Patient Details  Name: ALEXIO SROKA MRN: [id number] Date of Birth: [birth year] Referring Provider (PT): Erline Levine MD   Encounter Date: 04/22/2021   PT End of Session - 04/22/21 0935    Visit Number 13    Number of Visits 16    Date for PT Re-Evaluation 05/01/21   PN complete 04/03/21   Authorization Type Medicare A/ BCBS supplement 2nd (no auth)    Progress Note Due on Visit 16    PT Start Time 0918    PT Stop Time 0956    PT Time Calculation (min) 38 min    Activity Tolerance Patient tolerated treatment well;Patient limited by fatigue    Behavior During Therapy [facility] for tasks assessed/performed           Past Medical History:  Diagnosis Date  . Allergic rhinitis, unspecified    Allergy testing via allergist 04/2017--mostly neg.  Dr. Donneta Romberg recommended referral to ENT so i did this.  . Back pain    LBP->DDD/spondylosis w/ intermittent radiulopathy-type leg pains, + bilat hip and thigh pain-->spinal stenosis L4-5, L5-S1->Dr. Vertell Limber to do decomp and fusion surgery, scheduled for 12/03/20  . BPH (benign prostatic hypertrophy)    no hydro on renal u/s 05/2020  . Chronic nasal congestion   . DDD (degenerative disc disease), lumbar 2010   laminectomy 01/2009.  MR rpt 05/2019->advanced DDD/spondylosis L3-S1, with mod/sev L4-5 spinal stenosis-->plan for surg per Dr. Vertell Limber as of 09/2020  . Depression    Hospitalized for suicidal ideation 70.  Has been on lexapro since 10/2008.  . Diabetes mellitus type II    Dx'd approximately 06/2008.  No retinopathy as of 02/2017 ophth.  . Erectile dysfunction   . Heart murmur, systolic 80/9983   mild intensity, asymptomatic; echo 01/2018 valves fine->obs  . History of colon polyps 01/2016   Recall 3 yrs  . History of pneumonia 2008   Hospitalized  . History of scarlet fever     age 70  . Hyperlipidemia, mixed 1993   myalgias on pravastatin  . Hypertension 2009   "marked chronic cardiomegaly" on CXR 01/2009 per old records.  . 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 Insomnia   . Keratoconus    dx'd 1973  . Memory changes   . Mild cognitive impairment with memory loss 01/2020 eval with Dr. Delice Lesch   MRI brain 03/01/20 NORMAL  . OSA on CPAP 2004; 2019   Back on CPAP 04/2018.  Compliance great as of 01/2020 neuro/sleep f/u  . Osteoarthritis of both knees    Mild plain film changes in medial compartment bilat  . Restless leg    gabapentin helpful  . Rhinitis, chronic   . 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 APPENDECTOMY  1972  . COLONOSCOPY  X 4   Most recent was 2007 [place], [place]), with removal of polyps in the first two.  02/21/16: tubular adenoma.  Recall 3 yrs (Dr. Ardis Hughs).  . 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 ELECTROCARDIOGRAM  01/29/2009   NORMAL  . LUMBAR LAMINECTOMY  01/2009  . POSTERIOR LUMBAR FUSION  12/03/2020   L4-5, L5-S1 (Dr. Vertell Limber)  . TONSILLECTOMY AND ADENOIDECTOMY  age 37  . TRANSTHORACIC ECHOCARDIOGRAM  02/22/2018   EF 55-60%, grd I DD  . URETHRAL DILATION     2 as a child, one as an adult  . VASECTOMY  1994  . VITRECTOMY      There were no vitals filed for this visit.   Subjective Assessment - 04/22/21 0923    Subjective Pt reports he is feeling good today, has some "pinching" on Rt lower back.  Reports increased pain following sitting for long periods of time.  Reports some relief wiht supine marching and LTR stretch.    Pertinent History Lumbar fusion 12/03/20    Patient Stated Goals Get rid of pain, walk the dogs more, walk longer, improve balance    Currently in Pain? Yes    Pain Score 1    .5/10   Pain Orientation Lower;Right    Pain Descriptors / Indicators Aching   pinching                             [facility] Adult PT Treatment/Exercise - 04/22/21 0001      Balance Poses: Yoga   Warrior I 2 reps;30 seconds    Warrior II 2 reps;30 seconds      Lumbar Exercises: Stretches   Other Lumbar Stretch Exercise QL stretch 2x 20"      Lumbar Exercises: Standing   Row 20 reps;Theraband    Theraband Level (Row) Level 3 (Green)    Row Limitations HEP wiht ab set    Shoulder Extension 20 reps;Theraband    Theraband Level (Shoulder Extension) Level 3 (Green)    Shoulder Extension Limitations HEP    Other Standing Lumbar Exercises marching with GTB in shoulder extension 3" holds (too difficult) modified to 2 x 10 marching no hold      Lumbar Exercises: Supine   Pelvic Tilt 10 reps;10 seconds    Pelvic Tilt Limitations tactile and verbal session    Dead Bug 10 reps;3 seconds   2 sets                   PT Short Term Goals - 04/10/21 0931      PT SHORT TERM GOAL #1   Title Patient will be independent with initial HEP and self-management strategies to improve functional outcomes    Time 2    Period Weeks    Status Achieved    Target Date 03/24/21             PT Long Term Goals - 04/10/21 0932      PT LONG TERM GOAL #1   Title Patient will have equal to or > 4+/5 MMT throughout BLE to improve ability to perform functional mobility, stair ambulation and ADLs.    Time 4    Period Weeks    Status On-going      PT LONG TERM GOAL #2   Title Patient will report at least 75% overall improvement in subjective complaint to indicate improvement in ability to perform ADLs.    Baseline 03/26/21 - patient reports 15% improvement this far. 04/01/21 - patient reports 20%    Time 4    Period Weeks    Status On-going      PT LONG TERM GOAL #3   Title Patient will be able to maintain tandem stance >30 seconds on foam bilaterally to show improved stability and ability to ambulate outdoors    Baseline 04/01/21 - tandem on foam  left foot forward 12 seconds;  right foot forward 27 seconds.    Time 4    Period Weeks    Status On-going      PT LONG TERM GOAL #4   Title Patient will be able to walk >30 minutes with no increased back pain for walking dog and exercising outdoors.    Baseline 5/3//22 - reports walking about 1/5 of a mile with wife and one small dog but can't go further yet.    Time 4    Period Weeks    Status On-going                 Plan - 04/22/21 0946    Clinical Impression Statement Reviewed form with theraband for postural strengthening, pt able to recall and demonstrate appropriate mechanics, given theraband and printout to add to HEP.  Progressed core strengthening and balance with additional warrior poses, some instability though able to hold proper form with SBA.  Pt reports pressure upon rest following standing exercises and increased pressure with SLS activities (marching, power-ups, etx) though was resolved shortly.  Pt continues to require therapist facilitation for proper abdominal contraction.  Reports of relief during QL stretches.    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ery Low    Rehab Potential Good    PT Frequency 2x / week    PT Duration 4 weeks    PT Treatment/Interventions ADLs/Self Care Home Management;Aquatic Therapy;Canalith Repostioning;Cryotherapy;Electrical Stimulation;Contrast Bath;Therapeutic exercise;Patient/family education;Orthotic Fit/Training;Manual lymph drainage;Compression bandaging;Taping;Vasopneumatic Device;Joint Manipulations;Splinting;Energy conservation;Spinal Manipulations;Visual/perceptual remediation/compensation;Passive range of motion;Dry needling;Manual techniques;Scar  mobilization;Vestibular;Balance training;Neuromuscular re-education;DME Instruction;Gait training;Moist Heat;Traction;Stair training;Functional mobility training;Therapeutic activities;Ultrasound;Parrafin;Fluidtherapy;Biofeedback;Iontophoresis 4mg /ml Dexamethasone    PT Next Visit Plan Continue to progress core and glute strength as tolerated.    PT Home Exercise Plan Eval: ab set, hip abd/ add iso, bridge, RTB for clams 4/26: pelvic tilt, glute set; 04/03/21 - tandem stance on solid/compliant surfaces with/without head/arm movements.; 5/17: QL stretch 5/19 ab march, sit to stand    Consulted and Agree with Plan of Care Patient           Patient will benefit from skilled therapeutic intervention in order to improve the following deficits and impairments:  Pain,Improper body mechanics,Increased fascial restricitons,Abnormal gait,Decreased balance,Impaired flexibility,Decreased activity tolerance,Decreased range of motion,Decreased strength,Decreased mobility,Postural dysfunction  Visit Diagnosis: Chronic midline low back pain with bilateral sciatica  Low back pain, unspecified back pain laterality, unspecified chronicity, unspecified whether sciatica present  Muscle weakness (generalized)     Problem List Patient Active Problem List   Diagnosis Date Noted  . 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  . Vitamin D deficiency 04/16/2020  . Depression 04/16/2020  . Class 1 obesity with serious comorbidity and body mass index (BMI) of 33.0 to 33.9 in adult 04/16/2020  . Other fatigue 02/14/2018  . Shortness of breath on exertion 02/14/2018  . Type 2 diabetes mellitus with hyperglycemia, with long-term current use of insulin ([place]) 02/14/2018  . Abnormal EKG 02/14/2018  . Maxillary sinusitis, acute 12/17/2015  . Cough 12/17/2015  . Fever 12/17/2015  . Diabetes mellitus without complication ([place]) 17/49/4496  . Tooth pain 06/21/2013  . Orthostatic lightheadedness 05/17/2013  . Health  maintenance examination 05/17/2013  . Groin strain 11/10/2012  . Hypogonadism, male 01/11/2012  . Type II or unspecified type diabetes mellitus without mention  of complication, not stated as uncontrolled 07/08/2011  . HTN (hypertension), benign 07/08/2011  . Hyperlipidemia 07/08/2011  . BPH (benign prostatic hypertrophy) 07/08/2011  . Hx of adenomatous colonic polyps 07/08/2011   Ihor Austin, LPTA/CLT; CBIS [phone]  Aldona Lento 04/22/2021, 10:45 AM  [place], [place], [address] Phone: [phone]   Fax:  [phone]  Name: TAESHAWN HELFMAN MRN: [id number] Date of Birth: 1951/02/01

## 2021-04-24 ENCOUNTER — Ambulatory Visit (HOSPITAL_COMMUNITY): Payer: Medicare Other

## 2021-04-24 ENCOUNTER — Encounter (HOSPITAL_COMMUNITY): Payer: Self-pay

## 2021-04-24 ENCOUNTER — Other Ambulatory Visit: Payer: Self-pay

## 2021-04-24 DIAGNOSIS — M5441 Lumbago with sciatica, right side: Secondary | ICD-10-CM | POA: Diagnosis not present

## 2021-04-24 DIAGNOSIS — G8929 Other chronic pain: Secondary | ICD-10-CM

## 2021-04-24 DIAGNOSIS — M6281 Muscle weakness (generalized): Secondary | ICD-10-CM | POA: Diagnosis not present

## 2021-04-24 DIAGNOSIS — M545 Low back pain, unspecified: Secondary | ICD-10-CM | POA: Diagnosis not present

## 2021-04-24 DIAGNOSIS — M5442 Lumbago with sciatica, left side: Secondary | ICD-10-CM | POA: Diagnosis not present

## 2021-04-24 NOTE — Therapy (Signed)
[place], [place], [address] Phone: [phone]   Fax:  [phone]  Physical Therapy Treatment  Patient Details  Name: [name] [name] MRN: [id number] Date of Birth: 01/31/1951 Referring Provider (PT): Erline Levine MD   Encounter Date: 04/24/2021   PT End of Session - 04/24/21 1100    Visit Number 14    Number of Visits 16    Date for PT Re-Evaluation 05/01/21   PN complete 04/03/21   Authorization Type Medicare A/ BCBS supplement 2nd (no auth)    Progress Note Due on Visit 16    PT Start Time 1048    PT Stop Time 1126    PT Time Calculation (min) 38 min    Activity Tolerance Patient tolerated treatment well;Patient limited by fatigue    Behavior During Therapy [facility] for tasks assessed/performed           Past Medical History:  Diagnosis Date  . Allergic rhinitis, unspecified    Allergy testing via allergist 04/2017--mostly neg.  Dr. Donneta Romberg recommended referral to ENT so i did this.  . Back pain    LBP->DDD/spondylosis w/ intermittent radiulopathy-type leg pains, + bilat hip and thigh pain-->spinal stenosis L4-5, L5-S1->Dr. Vertell Limber to do decomp and fusion surgery, scheduled for 12/03/20  . BPH (benign prostatic hypertrophy)    no hydro on renal u/s 05/2020  . Chronic nasal congestion   . DDD (degenerative disc disease), lumbar 2010   laminectomy 01/2009.  MR rpt 05/2019->advanced DDD/spondylosis L3-S1, with mod/sev L4-5 spinal stenosis-->plan for surg per Dr. Vertell Limber as of 09/2020  . Depression    Hospitalized for suicidal ideation 58.  Has been on lexapro since 10/2008.  . Diabetes mellitus type II    Dx'd approximately 06/2008.  No retinopathy as of 02/2017 ophth.  . Erectile dysfunction   . Heart murmur, systolic 93/9030   mild intensity, asymptomatic; echo 01/2018 valves fine->obs  . History of colon polyps 01/2016   Recall 3 yrs  . History of pneumonia 2008   Hospitalized  . History of scarlet fever     age 17  . Hyperlipidemia, mixed 1993   myalgias on pravastatin  . Hypertension 2009   "marked chronic cardiomegaly" on CXR 01/2009 per old records.  . 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 Insomnia   . Keratoconus    dx'd 1973  . Memory changes   . Mild cognitive impairment with memory loss 01/2020 eval with Dr. Delice Lesch   MRI brain 03/01/20 NORMAL  . OSA on CPAP 2004; 2019   Back on CPAP 04/2018.  Compliance great as of 01/2020 neuro/sleep f/u  . Osteoarthritis of both knees    Mild plain film changes in medial compartment bilat  . Restless leg    gabapentin helpful  . Rhinitis, chronic   . 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 APPENDECTOMY  1972  . COLONOSCOPY  X 4   Most recent was 2007 [place], [place]), with removal of polyps in the first two.  02/21/16: tubular adenoma.  Recall 3 yrs (Dr. Ardis Hughs).  . 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 ELECTROCARDIOGRAM  01/29/2009   NORMAL  . LUMBAR LAMINECTOMY  01/2009  . POSTERIOR LUMBAR FUSION  12/03/2020   L4-5, L5-S1 (Dr. Vertell Limber)  . TONSILLECTOMY AND ADENOIDECTOMY  age 62  . TRANSTHORACIC ECHOCARDIOGRAM  02/22/2018   EF 55-60%, grd I DD  . URETHRAL DILATION     2 as a child, one as an adult  . VASECTOMY  1994  . VITRECTOMY      There were no vitals filed for this visit.   Subjective Assessment - 04/24/21 1049    Subjective Pt reports intemittent sharp pain over Rt iliac crest and tenderness on Lt side of lowerback dependent upon positioning    Pertinent History Lumbar fusion 12/03/20    Patient Stated Goals Get rid of pain, walk the dogs more, walk longer, improve balance    Currently in Pain? Yes    Pain Score 2     Pain Location Back    Pain Orientation Lower    Pain Descriptors / Indicators Sharp;Tender   Rt sharp, Lt tender   Pain Type Surgical pain    Pain Onset 1 to 4 weeks ago    Pain Frequency  Intermittent    Aggravating Factors  walking, bending, lifting    Pain Relieving Factors stretching, hot shower    Effect of Pain on Daily Activities limits              [facility] PT Assessment - 04/24/21 0001      Assessment   Medical Diagnosis LBP    Referring Provider (PT) Erline Levine MD    Onset Date/Surgical Date 12/03/20    Next MD Visit June (maybe 15th)    Prior Therapy No                         [facility] Adult PT Treatment/Exercise - 04/24/21 0001      Balance Poses: Yoga   Warrior I 2 reps;30 seconds    Warrior II 2 reps;30 seconds      Lumbar Exercises: Stretches   Other Lumbar Stretch Exercise QL stretch 3x 30"    Other Lumbar Stretch Exercise Child's pose 30" each direction (neutral, Lt and Rt)      Lumbar Exercises: Standing   Other Standing Lumbar Exercises marching      Lumbar Exercises: Supine   Pelvic Tilt 10 reps;10 seconds    Pelvic Tilt Limitations tactile and verbal session    Dead Bug 10 reps;3 seconds    Dead Bug Limitations 2 sets    Bridge 10 reps      Lumbar Exercises: Quadruped   Single Arm Raise 5 reps    Opposite Arm/Leg Raise 5 reps;Right arm/Left leg;Left arm/Right leg;5 seconds                    PT Short Term Goals - 04/10/21 0931      PT SHORT TERM GOAL #1   Title Patient will be independent with initial HEP and self-management strategies to improve functional outcomes    Time 2    Period Weeks    Status Achieved    Target Date 03/24/21             PT Long Term Goals - 04/10/21 0932      PT LONG TERM GOAL #1   Title Patient will have equal to or > 4+/5 MMT throughout BLE to improve ability to perform functional mobility, stair ambulation and ADLs.    Time 4    Period Weeks    Status On-going      PT LONG TERM GOAL #2   Title Patient will report at least 75%  overall improvement in subjective complaint to indicate improvement in ability to perform ADLs.    Baseline 03/26/21 - patient reports 15%  improvement this far. 04/01/21 - patient reports 20%    Time 4    Period Weeks    Status On-going      PT LONG TERM GOAL #3   Title Patient will be able to maintain tandem stance >30 seconds on foam bilaterally to show improved stability and ability to ambulate outdoors    Baseline 04/01/21 - tandem on foam left foot forward 12 seconds; right foot forward 27 seconds.    Time 4    Period Weeks    Status On-going      PT LONG TERM GOAL #4   Title Patient will be able to walk >30 minutes with no increased back pain for walking dog and exercising outdoors.    Baseline 5/3//22 - reports walking about 1/5 of a mile with wife and one small dog but can't go further yet.    Time 4    Period Weeks    Status On-going                 Plan - 04/24/21 1105    Clinical Impression Statement Pt presents with improved stability noted wiht warrior poses.  Continued session focus with core and proximal strengthening.  Reports of intermittent pain over Rt iliac crest.  Added additional stretches for lower back with reports of relief following.  Pt continues to require therapist facilitation to improve core activation and some difficulty with SLS activities.    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ery Low    Rehab Potential Good    PT Frequency 2x / week    PT Duration 4 weeks    PT Treatment/Interventions ADLs/Self Care Home Management;Aquatic Therapy;Canalith Repostioning;Cryotherapy;Electrical Stimulation;Contrast Bath;Therapeutic exercise;Patient/family education;Orthotic Fit/Training;Manual lymph drainage;Compression bandaging;Taping;Vasopneumatic Device;Joint Manipulations;Splinting;Energy  conservation;Spinal Manipulations;Visual/perceptual remediation/compensation;Passive range of motion;Dry needling;Manual techniques;Scar mobilization;Vestibular;Balance training;Neuromuscular re-education;DME Instruction;Gait training;Moist Heat;Traction;Stair training;Functional mobility training;Therapeutic activities;Ultrasound;Parrafin;Fluidtherapy;Biofeedback;Iontophoresis 4mg /ml Dexamethasone    PT Next Visit Plan Review goals next week.  Continue to progress core and glute strength as tolerated.    PT Home Exercise Plan Eval: ab set, hip abd/ add iso, bridge, RTB for clams 4/26: pelvic tilt, glute set; 04/03/21 - tandem stance on solid/compliant surfaces with/without head/arm movements.; 5/17: QL stretch 5/19 ab march, sit to stand    Consulted and Agree with Plan of Care Patient           Patient will benefit from skilled therapeutic intervention in order to improve the following deficits and impairments:  Pain,Improper body mechanics,Increased fascial restricitons,Abnormal gait,Decreased balance,Impaired flexibility,Decreased activity tolerance,Decreased range of motion,Decreased strength,Decreased mobility,Postural dysfunction  Visit Diagnosis: Chronic midline low back pain with bilateral sciatica  Low back pain, unspecified back pain laterality, unspecified chronicity, unspecified whether sciatica present  Muscle weakness (generalized)     Problem List Patient Active Problem List   Diagnosis Date Noted  . 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  . Vitamin D deficiency 04/16/2020  . Depression 04/16/2020  . Class 1 obesity with serious comorbidity and body mass index (BMI) of 33.0 to 33.9 in adult 04/16/2020  . Other fatigue 02/14/2018  . Shortness of breath on exertion 02/14/2018  . Type 2 diabetes mellitus with hyperglycemia, with long-term current use of insulin (  [place]) 02/14/2018  . Abnormal EKG 02/14/2018  . Maxillary sinusitis, acute 12/17/2015  . Cough 12/17/2015   . Fever 12/17/2015  . Diabetes mellitus without complication ([place]) 08/81/1031  . Tooth pain 06/21/2013  . Orthostatic lightheadedness 05/17/2013  . Health maintenance examination 05/17/2013  . Groin strain 11/10/2012  . Hypogonadism, male 01/11/2012  . Type II or unspecified type diabetes mellitus without mention of complication, not stated as uncontrolled 07/08/2011  . HTN (hypertension), benign 07/08/2011  . Hyperlipidemia 07/08/2011  . BPH (benign prostatic hypertrophy) 07/08/2011  . Hx of adenomatous colonic polyps 07/08/2011   Ihor Austin, LPTA/CLT; CBIS [phone]  Aldona Lento 04/24/2021, 11:26 AM  [facility] [address] [place], [place], [address] Phone: [phone]   Fax:  [phone]  Name: ZAYVIAN MCMURTRY MRN: [id number] Date of Birth: 01/26/1951

## 2021-04-25 ENCOUNTER — Other Ambulatory Visit (INDEPENDENT_AMBULATORY_CARE_PROVIDER_SITE_OTHER): Payer: Self-pay | Admitting: Physician Assistant

## 2021-04-25 DIAGNOSIS — E559 Vitamin D deficiency, unspecified: Secondary | ICD-10-CM

## 2021-04-29 ENCOUNTER — Other Ambulatory Visit: Payer: Self-pay

## 2021-04-29 ENCOUNTER — Encounter (HOSPITAL_COMMUNITY): Payer: Self-pay | Admitting: Physical Therapy

## 2021-04-29 ENCOUNTER — Ambulatory Visit (HOSPITAL_COMMUNITY): Payer: Medicare Other | Admitting: Physical Therapy

## 2021-04-29 DIAGNOSIS — M5442 Lumbago with sciatica, left side: Secondary | ICD-10-CM | POA: Diagnosis not present

## 2021-04-29 DIAGNOSIS — M5441 Lumbago with sciatica, right side: Secondary | ICD-10-CM | POA: Diagnosis not present

## 2021-04-29 DIAGNOSIS — G8929 Other chronic pain: Secondary | ICD-10-CM | POA: Diagnosis not present

## 2021-04-29 DIAGNOSIS — M545 Low back pain, unspecified: Secondary | ICD-10-CM | POA: Diagnosis not present

## 2021-04-29 DIAGNOSIS — M6281 Muscle weakness (generalized): Secondary | ICD-10-CM | POA: Diagnosis not present

## 2021-04-29 NOTE — Therapy (Signed)
[place] [address] [place], [place], [address] Phone: [phone]   Fax:  [phone]  Physical Therapy Treatment  Patient Details  Name: [name] [name] MRN: [id number] Date of Birth: 11/28/1951 Referring Provider (PT): Erline Levine MD  Progress Note Reporting Period 04/03/21 to 04/29/21  See note below for Objective Data and Assessment of Progress/Goals.       Encounter Date: 04/29/2021   PT End of Session - 04/29/21 1037    Visit Number 15    Number of Visits 19    Date for PT Re-Evaluation 05/27/21   PN complete 04/03/21   Authorization Type Medicare A/ BCBS supplement 2nd (no auth)    Progress Note Due on Visit 16    PT Start Time 1033    PT Stop Time 1113    PT Time Calculation (min) 40 min    Activity Tolerance Patient tolerated treatment well    Behavior During Therapy WFL for tasks assessed/performed           Past Medical History:  Diagnosis Date  . Allergic rhinitis, unspecified    Allergy testing via allergist 04/2017--mostly neg.  Dr. Donneta Romberg recommended referral to ENT so i did this.  . Back pain    LBP->DDD/spondylosis w/ intermittent radiulopathy-type leg pains, + bilat hip and thigh pain-->spinal stenosis L4-5, L5-S1->Dr. Vertell Limber to do decomp and fusion surgery, scheduled for 12/03/20  . BPH (benign prostatic hypertrophy)    no hydro on renal u/s 05/2020  . Chronic nasal congestion   . DDD (degenerative disc disease), lumbar 2010   laminectomy 01/2009.  MR rpt 05/2019->advanced DDD/spondylosis L3-S1, with mod/sev L4-5 spinal stenosis-->plan for surg per Dr. Vertell Limber as of 09/2020  . Depression    Hospitalized for suicidal ideation 37.  Has been on lexapro since 10/2008.  . Diabetes mellitus type II    Dx'd approximately 06/2008.  No retinopathy as of 02/2017 ophth.  . Erectile dysfunction   . Heart murmur, systolic 82/9937   mild intensity, asymptomatic; echo 01/2018 valves fine->obs  . History of colon  polyps 01/2016   Recall 3 yrs  . History of pneumonia 2008   Hospitalized  . History of scarlet fever    age 70  . Hyperlipidemia, mixed 1993   myalgias on pravastatin  . Hypertension 2009   "marked chronic cardiomegaly" on CXR 01/2009 per old records.  . 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 Insomnia   . Keratoconus    dx'd 1973  . Memory changes   . Mild cognitive impairment with memory loss 01/2020 eval with Dr. Delice Lesch   MRI brain 03/01/20 NORMAL  . OSA on CPAP 2004; 2019   Back on CPAP 04/2018.  Compliance great as of 01/2020 neuro/sleep f/u  . Osteoarthritis of both knees    Mild plain film changes in medial compartment bilat  . Restless leg    gabapentin helpful  . Rhinitis, chronic   . 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 APPENDECTOMY  1972  . COLONOSCOPY  X 4   Most recent was 2007 [place], [place]), with removal of polyps in the first two.  02/21/16: tubular adenoma.  Recall 3 yrs (Dr. Ardis Hughs).  . 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 ELECTROCARDIOGRAM  01/29/2009   NORMAL  . LUMBAR LAMINECTOMY  01/2009  .  POSTERIOR LUMBAR FUSION  12/03/2020   L4-5, L5-S1 (Dr. Vertell Limber)  . TONSILLECTOMY AND ADENOIDECTOMY     age 70  . TRANSTHORACIC ECHOCARDIOGRAM  02/22/2018   EF 55-60%, grd I DD  . URETHRAL DILATION     2 as a child, one as an adult  . VASECTOMY  1994  . VITRECTOMY      There were no vitals filed for this visit.   Subjective Assessment - 04/29/21 1036    Subjective Patient states he feels a lot better. He had a sharp pain in his hip yesterday. He used an ice pack and went back to sleep an it is gone now. Exercises are getting easier to do. Patient reports 60% improvement since starting therpay.    Pertinent History Lumbar fusion 12/03/20    Patient Stated Goals Get rid of pain, walk the dogs more, walk longer, improve balance    Currently in  Pain? No/denies    Pain Onset 1 to 4 weeks ago              [facility] PT Assessment - 04/29/21 0001      Assessment   Medical Diagnosis LBP    Referring Provider (PT) Erline Levine MD    Onset Date/Surgical Date 12/03/20    Prior Therapy No      Precautions   Precautions None      Restrictions   Weight Bearing Restrictions No      Home Environment   Living Environment Private residence    Living Arrangements Spouse/significant other      Prior Function   Level of Independence Independent      Cognition   Overall Cognitive Status Within Functional Limits for tasks assessed      Observation/Other Assessments   Focus on Therapeutic Outcomes (FOTO)  57% function   was 51% function     Strength   Right Hip Flexion 5/5    Right Hip Extension 4+/5    Right Hip ABduction 4+/5    Left Hip Flexion 5/5   was 4+   Left Hip Extension 4+/5   was 4-   Left Hip ABduction 4+/5   was 4   Right Knee Extension 5/5    Left Knee Extension 5/5    Right Ankle Dorsiflexion 5/5    Left Ankle Dorsiflexion 4+/5      Ambulation/Gait   Stairs Yes    Stairs Assistance 6: Modified independent (Device/Increase time)    Stair Management Technique One rail Right;Alternating pattern    Number of Stairs 10    Height of Stairs 7      Static Standing Balance   Static Standing Balance -  Activities  Tandam Stance - Right Leg;Tandam Stance - Left Leg   on blue foam   Static Standing - Comment/# of Minutes 30 sec bilateral but min sway with RLE forward                         [facility] Adult PT Treatment/Exercise - 04/29/21 0001      Lumbar Exercises: Supine   Bent Knee Raise 20 reps    Dead Bug 20 reps    Bridge 10 reps                    PT Short Term Goals - 04/10/21 0931      PT SHORT TERM GOAL #1   Title Patient will be independent with initial HEP and self-management strategies  to improve functional outcomes    Time 2    Period Weeks    Status Achieved    Target  Date 03/24/21             PT Long Term Goals - 04/29/21 1043      PT LONG TERM GOAL #1   Title Patient will have equal to or > 4+/5 MMT throughout BLE to improve ability to perform functional mobility, stair ambulation and ADLs.    Baseline See MMT    Time 4    Period Weeks    Status Achieved      PT LONG TERM GOAL #2   Title Patient will report at least 75% overall improvement in subjective complaint to indicate improvement in ability to perform ADLs.    Baseline 03/26/21 - patient reports 15% improvement this far. 04/01/21 - patient reports 20% 5/31 reports 60%    Time 4    Period Weeks    Status On-going      PT LONG TERM GOAL #3   Title Patient will be able to maintain tandem stance >30 seconds on foam bilaterally to show improved stability and ability to ambulate outdoors    Baseline 04/01/21 - tandem on foam left foot forward 12 seconds; right foot forward 27 seconds. 5/31 acheived (30 sec bilateral but min sway with RLE forward)    Time 4    Period Weeks    Status Achieved      PT LONG TERM GOAL #4   Title Patient will be able to walk >30 minutes with no increased back pain for walking dog and exercising outdoors.    Baseline 5/3//22 - reports walking about 1/5 of a mile with wife and one small dog but can't go further yet. 5/31 reports 15 min    Time 4    Period Weeks    Status On-going                 Plan - 04/29/21 1112    Clinical Impression Statement Patient showing good progress toward therapy goals. Patient shows improved strength and balance, but remains limited by decreased activity tolerance, pain and decreased self-perceived function. Discussed therapy POC and progress toward therapy goals and decreased frequency to 1 x weekly for another 4 weeks. Patient agrees with this plan. Patient will benefit from skilled therapy services to progress higher level balance, hip and core strength for decreased back pain and improved functional ability.    Personal  Factors and Comorbidities Comorbidity 2    Comorbidities arthritis, DM    Examination-Activity Limitations Lift;Stand;Locomotion Level;Transfers;Carry;Stairs;Bend;Squat    Examination-Participation Restrictions Community Activity;Yard Work    Stability/Clinical Decision Making Stable/Uncomplicated    Rehab Potential Good    PT Frequency 1x / week    PT Duration 4 weeks    PT Treatment/Interventions ADLs/Self Care Home Management;Aquatic Therapy;Canalith Repostioning;Cryotherapy;Electrical Stimulation;Contrast Bath;Therapeutic exercise;Patient/family education;Orthotic Fit/Training;Manual lymph drainage;Compression bandaging;Taping;Vasopneumatic Device;Joint Manipulations;Splinting;Energy conservation;Spinal Manipulations;Visual/perceptual remediation/compensation;Passive range of motion;Dry needling;Manual techniques;Scar mobilization;Vestibular;Balance training;Neuromuscular re-education;DME Instruction;Gait training;Moist Heat;Traction;Stair training;Functional mobility training;Therapeutic activities;Ultrasound;Parrafin;Fluidtherapy;Biofeedback;Iontophoresis 4mg /ml Dexamethasone    PT Next Visit Plan Continue to progress higher level balance, hip and core strengthening as tolerated.    PT Home Exercise Plan Eval: ab set, hip abd/ add iso, bridge, RTB for clams 4/26: pelvic tilt, glute set; 04/03/21 - tandem stance on solid/compliant surfaces with/without head/arm movements.; 5/17: QL stretch 5/19 ab march, sit to stand    Consulted and Agree with Plan of Care Patient  Patient will benefit from skilled therapeutic intervention in order to improve the following deficits and impairments:  Pain,Improper body mechanics,Increased fascial restricitons,Abnormal gait,Decreased balance,Impaired flexibility,Decreased activity tolerance,Decreased range of motion,Decreased strength,Decreased mobility,Postural dysfunction  Visit Diagnosis: Chronic midline low back pain with bilateral sciatica  Low  back pain, unspecified back pain laterality, unspecified chronicity, unspecified whether sciatica present  Muscle weakness (generalized)     Problem List Patient Active Problem List   Diagnosis Date Noted  . 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  . Vitamin D deficiency 04/16/2020  . Depression 04/16/2020  . Class 1 obesity with serious comorbidity and body mass index (BMI) of 33.0 to 33.9 in adult 04/16/2020  . Other fatigue 02/14/2018  . Shortness of breath on exertion 02/14/2018  . Type 2 diabetes mellitus with hyperglycemia, with long-term current use of insulin ([place]) 02/14/2018  . Abnormal EKG 02/14/2018  . Maxillary sinusitis, acute 12/17/2015  . Cough 12/17/2015  . Fever 12/17/2015  . Diabetes mellitus without complication ([place]) 53/64/6803  . Tooth pain 06/21/2013  . Orthostatic lightheadedness 05/17/2013  . Health maintenance examination 05/17/2013  . Groin strain 11/10/2012  . Hypogonadism, male 01/11/2012  . Type II or unspecified type diabetes mellitus without mention of complication, not stated as uncontrolled 07/08/2011  . HTN (hypertension), benign 07/08/2011  . Hyperlipidemia 07/08/2011  . BPH (benign prostatic hypertrophy) 07/08/2011  . Hx of adenomatous colonic polyps 07/08/2011    11:20 AM, 04/29/21 Josue Hector PT DPT  Physical Therapist with [facility]  [phone] [place] [address] [place], [place], [address] Phone: [phone]   Fax:  [phone]  Name: KEITA VALLEY MRN: [id number] Date of Birth: [birth year]

## 2021-04-30 ENCOUNTER — Encounter (HOSPITAL_COMMUNITY): Payer: Self-pay

## 2021-04-30 ENCOUNTER — Ambulatory Visit (HOSPITAL_COMMUNITY): Payer: Medicare Other | Attending: Neurosurgery

## 2021-04-30 DIAGNOSIS — M5442 Lumbago with sciatica, left side: Secondary | ICD-10-CM | POA: Diagnosis not present

## 2021-04-30 DIAGNOSIS — M545 Low back pain, unspecified: Secondary | ICD-10-CM | POA: Diagnosis not present

## 2021-04-30 DIAGNOSIS — M5441 Lumbago with sciatica, right side: Secondary | ICD-10-CM | POA: Diagnosis not present

## 2021-04-30 DIAGNOSIS — M6281 Muscle weakness (generalized): Secondary | ICD-10-CM | POA: Insufficient documentation

## 2021-04-30 DIAGNOSIS — G8929 Other chronic pain: Secondary | ICD-10-CM | POA: Diagnosis not present

## 2021-04-30 NOTE — Therapy (Signed)
[facility] [place], [place], [address] Phone: [phone]   Fax:  [phone]  Physical Therapy Treatment  Patient Details  Name: [name] [name] MRN: [id number] Date of Birth: Aug 29, 1951 Referring Provider (PT): Erline Levine MD   Encounter Date: 04/30/2021   PT End of Session - 04/30/21 0947    Visit Number 16    Number of Visits 19    Date for PT Re-Evaluation 05/27/21    Authorization Type Medicare A/ BCBS supplement 2nd (no auth)    Progress Note Due on Visit 19    PT Start Time 0917    PT Stop Time 0955    PT Time Calculation (min) 38 min    Activity Tolerance Patient tolerated treatment well;No increased pain    Behavior During Therapy WFL for tasks assessed/performed           Past Medical History:  Diagnosis Date  . Allergic rhinitis, unspecified    Allergy testing via allergist 04/2017--mostly neg.  Dr. Donneta Romberg recommended referral to ENT so i did this.  . Back pain    LBP->DDD/spondylosis w/ intermittent radiulopathy-type leg pains, + bilat hip and thigh pain-->spinal stenosis L4-5, L5-S1->Dr. Vertell Limber to do decomp and fusion surgery, scheduled for 12/03/20  . BPH (benign prostatic hypertrophy)    no hydro on renal u/s 05/2020  . Chronic nasal congestion   . DDD (degenerative disc disease), lumbar 2010   laminectomy 01/2009.  MR rpt 05/2019->advanced DDD/spondylosis L3-S1, with mod/sev L4-5 spinal stenosis-->plan for surg per Dr. Vertell Limber as of 09/2020  . Depression    Hospitalized for suicidal ideation 52.  Has been on lexapro since 10/2008.  . Diabetes mellitus type II    Dx'd approximately 06/2008.  No retinopathy as of 02/2017 ophth.  . Erectile dysfunction   . Heart murmur, systolic 11/930   mild intensity, asymptomatic; echo 01/2018 valves fine->obs  . History of colon polyps 01/2016   Recall 3 yrs  . History of pneumonia 2008   Hospitalized  . History of scarlet fever    age 89  . Hyperlipidemia,  mixed 1993   myalgias on pravastatin  . Hypertension 2009   "marked chronic cardiomegaly" on CXR 01/2009 per old records.  . 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 Insomnia   . Keratoconus    dx'd 1973  . Memory changes   . Mild cognitive impairment with memory loss 01/2020 eval with Dr. Delice Lesch   MRI brain 03/01/20 NORMAL  . OSA on CPAP 2004; 2019   Back on CPAP 04/2018.  Compliance great as of 01/2020 neuro/sleep f/u  . Osteoarthritis of both knees    Mild plain film changes in medial compartment bilat  . Restless leg    gabapentin helpful  . Rhinitis, chronic   . 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 APPENDECTOMY  1972  . COLONOSCOPY  X 4   Most recent was 2007 [place], [place]), with removal of polyps in the first two.  02/21/16: tubular adenoma.  Recall 3 yrs (Dr. Ardis Hughs).  . 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 ELECTROCARDIOGRAM  01/29/2009   NORMAL  . LUMBAR LAMINECTOMY  01/2009  . POSTERIOR LUMBAR FUSION  12/03/2020   L4-5, L5-S1 (Dr. Vertell Limber)  . TONSILLECTOMY AND ADENOIDECTOMY     age 9  .  TRANSTHORACIC ECHOCARDIOGRAM  02/22/2018   EF 55-60%, grd I DD  . URETHRAL DILATION     2 as a child, one as an adult  . VASECTOMY  1994  . VITRECTOMY      There were no vitals filed for this visit.   Subjective Assessment - 04/30/21 0920    Subjective Pain is low today, 1/10.  Feels he has made some improvements with balance, wishes to continue working on balance and hip strengthening.    Pertinent History Lumbar fusion 12/03/20    Patient Stated Goals Get rid of pain, walk the dogs more, walk longer, improve balance    Currently in Pain? Yes    Pain Score 1     Pain Location Back    Pain Orientation Lower    Pain Descriptors / Indicators Tender    Pain Type Surgical pain    Pain Onset 1 to 4 weeks ago    Pain Frequency Intermittent    Aggravating Factors   walking, bending lifting    Pain Relieving Factors stretching, hot shower    Effect of Pain on Daily Activities limits                             [facility] Adult PT Treatment/Exercise - 04/30/21 0001      Balance Poses: Yoga   Warrior I 2 reps;30 seconds    Warrior II 2 reps;30 seconds      Lumbar Exercises: Standing   Heel Raises 10 reps    Heel Raises Limitations squat then heel raise no HHA, chair behind for mechanics    Functional Squats 10 reps    Functional Squats Limitations squat then heel raise no HHA, chair behind for mechanics    Lifting From floor;5 reps    Forward Lunge 10 reps;3 seconds    Forward Lunge Limitations UE overhead wiht ab set    Side Lunge 10 reps    Other Standing Lumbar Exercises paloff press GTB 4x 15 tandem stance; tandem stance 1x 30" then 5 head rotations -difficult    Other Standing Lumbar Exercises SLS Rt 8", Lt 4" max of 5; vector stance 3x 5"; minisquat sidestep                    PT Short Term Goals - 04/10/21 0931      PT SHORT TERM GOAL #1   Title Patient will be independent with initial HEP and self-management strategies to improve functional outcomes    Time 2    Period Weeks    Status Achieved    Target Date 03/24/21             PT Long Term Goals - 04/29/21 1043      PT LONG TERM GOAL #1   Title Patient will have equal to or > 4+/5 MMT throughout BLE to improve ability to perform functional mobility, stair ambulation and ADLs.    Baseline See MMT    Time 4    Period Weeks    Status Achieved      PT LONG TERM GOAL #2   Title Patient will report at least 75% overall improvement in subjective complaint to indicate improvement in ability to perform ADLs.    Baseline 03/26/21 - patient reports 15% improvement this far. 04/01/21 - patient reports 20% 5/31 reports 60%    Time 4    Period Weeks    Status On-going  PT LONG TERM GOAL #3   Title Patient will be able to maintain tandem stance >30  seconds on foam bilaterally to show improved stability and ability to ambulate outdoors    Baseline 04/01/21 - tandem on foam left foot forward 12 seconds; right foot forward 27 seconds. 5/31 acheived (30 sec bilateral but min sway with RLE forward)    Time 4    Period Weeks    Status Achieved      PT LONG TERM GOAL #4   Title Patient will be able to walk >30 minutes with no increased back pain for walking dog and exercising outdoors.    Baseline 5/3//22 - reports walking about 1/5 of a mile with wife and one small dog but can't go further yet. 5/31 reports 15 min    Time 4    Period Weeks    Status On-going                 Plan - 04/30/21 0959    Clinical Impression Statement Session focus on core stability and proximal strengthening.  Added proper lifting and functional strengthening exercises wiht cueing for mechanics and hold times.  Pt tolerated well with no reoprts of increased pain, was limited by fatigue required seated rest breaks.    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ery Low    Rehab Potential Good    PT Frequency 1x / week    PT Duration 4 weeks    PT Treatment/Interventions ADLs/Self Care Home Management;Aquatic Therapy;Canalith Repostioning;Cryotherapy;Electrical Stimulation;Contrast Bath;Therapeutic exercise;Patient/family education;Orthotic Fit/Training;Manual lymph drainage;Compression bandaging;Taping;Vasopneumatic Device;Joint Manipulations;Splinting;Energy conservation;Spinal Manipulations;Visual/perceptual remediation/compensation;Passive range of motion;Dry needling;Manual techniques;Scar mobilization;Vestibular;Balance training;Neuromuscular re-education;DME  Instruction;Gait training;Moist Heat;Traction;Stair training;Functional mobility training;Therapeutic activities;Ultrasound;Parrafin;Fluidtherapy;Biofeedback;Iontophoresis 4mg /ml Dexamethasone    PT Next Visit Plan Continue to progress higher level balance, hip and core strengthening as tolerated.    PT Home Exercise Plan Eval: ab set, hip abd/ add iso, bridge, RTB for clams 4/26: pelvic tilt, glute set; 04/03/21 - tandem stance on solid/compliant surfaces with/without head/arm movements.; 5/17: QL stretch 5/19 ab march, sit to stand    Consulted and Agree with Plan of Care Patient           Patient will benefit from skilled therapeutic intervention in order to improve the following deficits and impairments:  Pain,Improper body mechanics,Increased fascial restricitons,Abnormal gait,Decreased balance,Impaired flexibility,Decreased activity tolerance,Decreased range of motion,Decreased strength,Decreased mobility,Postural dysfunction  Visit Diagnosis: Chronic midline low back pain with bilateral sciatica  Low back pain, unspecified back pain laterality, unspecified chronicity, unspecified whether sciatica present  Muscle weakness (generalized)     Problem List Patient Active Problem List   Diagnosis Date Noted  . 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  . Vitamin D deficiency 04/16/2020  . Depression 04/16/2020  . Class 1 obesity with serious comorbidity and body mass index (BMI) of 33.0 to 33.9 in adult 04/16/2020  . Other fatigue 02/14/2018  . Shortness of breath on exertion 02/14/2018  . Type 2 diabetes mellitus with hyperglycemia, with long-term current use of insulin ([place]) 02/14/2018  . Abnormal EKG 02/14/2018  . Maxillary sinusitis, acute 12/17/2015  . Cough 12/17/2015  . Fever 12/17/2015  . Diabetes mellitus without complication ([place]) 26/94/8546  . Tooth pain 06/21/2013  . Orthostatic lightheadedness 05/17/2013  . Health maintenance examination 05/17/2013  . Groin  strain 11/10/2012  .  Hypogonadism, male 01/11/2012  . Type II or unspecified type diabetes mellitus without mention of complication, not stated as uncontrolled 07/08/2011  . HTN (hypertension), benign 07/08/2011  . Hyperlipidemia 07/08/2011  . BPH (benign prostatic hypertrophy) 07/08/2011  . Hx of adenomatous colonic polyps 07/08/2011   Ihor Austin, LPTA/CLT; CBIS [phone]  Aldona Lento 04/30/2021, 10:02 AM  [facility] [address] [place], [place], [address] Phone: [phone]   Fax:  [phone]  Name: [name] [name] MRN: [id number] Date of Birth: February 22, 1951

## 2021-04-30 NOTE — Telephone Encounter (Signed)
Pt last seen by Tracey Aguilar, PA-C.  

## 2021-05-01 ENCOUNTER — Encounter (HOSPITAL_COMMUNITY): Payer: Medicare Other | Admitting: Physical Therapy

## 2021-05-06 ENCOUNTER — Encounter (HOSPITAL_COMMUNITY): Payer: Self-pay

## 2021-05-06 ENCOUNTER — Other Ambulatory Visit: Payer: Self-pay

## 2021-05-06 ENCOUNTER — Ambulatory Visit (HOSPITAL_COMMUNITY): Payer: Medicare Other

## 2021-05-06 DIAGNOSIS — G8929 Other chronic pain: Secondary | ICD-10-CM

## 2021-05-06 DIAGNOSIS — M545 Low back pain, unspecified: Secondary | ICD-10-CM | POA: Diagnosis not present

## 2021-05-06 DIAGNOSIS — M6281 Muscle weakness (generalized): Secondary | ICD-10-CM

## 2021-05-06 DIAGNOSIS — M5442 Lumbago with sciatica, left side: Secondary | ICD-10-CM | POA: Diagnosis not present

## 2021-05-06 DIAGNOSIS — M5441 Lumbago with sciatica, right side: Secondary | ICD-10-CM | POA: Diagnosis not present

## 2021-05-06 NOTE — Therapy (Signed)
[facility] [place], [place], [address] Phone: [phone]   Fax:  [phone]  Physical Therapy Treatment  Patient Details  Name: [name] [name] MRN: [id number] Date of Birth: October 24, 1951 Referring Provider (PT): Erline Levine MD   Encounter Date: 05/06/2021   PT End of Session - 05/06/21 1024    Visit Number 17    Number of Visits 19    Date for PT Re-Evaluation 05/27/21    Authorization Type Medicare A/ BCBS supplement 2nd (no auth)    Progress Note Due on Visit 19    PT Start Time 0955    PT Stop Time 1037    PT Time Calculation (min) 42 min    Activity Tolerance Patient tolerated treatment well;No increased pain    Behavior During Therapy WFL for tasks assessed/performed           Past Medical History:  Diagnosis Date  . Allergic rhinitis, unspecified    Allergy testing via allergist 04/2017--mostly neg.  Dr. Donneta Romberg recommended referral to ENT so i did this.  . Back pain    LBP->DDD/spondylosis w/ intermittent radiulopathy-type leg pains, + bilat hip and thigh pain-->spinal stenosis L4-5, L5-S1->Dr. Vertell Limber to do decomp and fusion surgery, scheduled for 12/03/20  . BPH (benign prostatic hypertrophy)    no hydro on renal u/s 05/2020  . Chronic nasal congestion   . DDD (degenerative disc disease), lumbar 2010   laminectomy 01/2009.  MR rpt 05/2019->advanced DDD/spondylosis L3-S1, with mod/sev L4-5 spinal stenosis-->plan for surg per Dr. Vertell Limber as of 09/2020  . Depression    Hospitalized for suicidal ideation 7.  Has been on lexapro since 10/2008.  . Diabetes mellitus type II    Dx'd approximately 06/2008.  No retinopathy as of 02/2017 ophth.  . Erectile dysfunction   . Heart murmur, systolic 21/1941   mild intensity, asymptomatic; echo 01/2018 valves fine->obs  . History of colon polyps 01/2016   Recall 3 yrs  . History of pneumonia 2008   Hospitalized  . History of scarlet fever    age 89  . Hyperlipidemia,  mixed 1993   myalgias on pravastatin  . Hypertension 2009   "marked chronic cardiomegaly" on CXR 01/2009 per old records.  . 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 Insomnia   . Keratoconus    dx'd 1973  . Memory changes   . Mild cognitive impairment with memory loss 01/2020 eval with Dr. Delice Lesch   MRI brain 03/01/20 NORMAL  . OSA on CPAP 2004; 2019   Back on CPAP 04/2018.  Compliance great as of 01/2020 neuro/sleep f/u  . Osteoarthritis of both knees    Mild plain film changes in medial compartment bilat  . Restless leg    gabapentin helpful  . Rhinitis, chronic   . 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 APPENDECTOMY  1972  . COLONOSCOPY  X 4   Most recent was 2007 [place], [place]), with removal of polyps in the first two.  02/21/16: tubular adenoma.  Recall 3 yrs (Dr. Ardis Hughs).  . 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 ELECTROCARDIOGRAM  01/29/2009   NORMAL  . LUMBAR LAMINECTOMY  01/2009  . POSTERIOR LUMBAR FUSION  12/03/2020   L4-5, L5-S1 (Dr. Vertell Limber)  . TONSILLECTOMY AND ADENOIDECTOMY     age 63  .  TRANSTHORACIC ECHOCARDIOGRAM  02/22/2018   EF 55-60%, grd I DD  . URETHRAL DILATION     2 as a child, one as an adult  . VASECTOMY  1994  . VITRECTOMY      There were no vitals filed for this visit.   Subjective Assessment - 05/06/21 0958    Subjective Pt stated ability to walk 1/69mile prior the pressure.  Pain minimal mainly in Rt hip area    Pertinent History Lumbar fusion 12/03/20    Patient Stated Goals Get rid of pain, walk the dogs more, walk longer, improve balance    Currently in Pain? Yes    Pain Score 1     Pain Location Back    Pain Orientation Right;Lower    Pain Descriptors / Indicators Pressure    Pain Type Surgical pain    Pain Onset 1 to 4 weeks ago    Pain Frequency Intermittent    Aggravating Factors  walking, bending, lifting    Pain  Relieving Factors stretching, hot shower    Effect of Pain on Daily Activities limits                             [facility] Adult PT Treatment/Exercise - 05/06/21 0001      Lumbar Exercises: Stretches   Lower Trunk Rotation Limitations LTR on theraball 10x      Lumbar Exercises: Standing   Heel Raises 15 reps    Heel Raises Limitations squat then heel raise no HHA, chair behind for mechanics    Functional Squats 15 reps    Functional Squats Limitations squat then heel raise no HHA, chair behind for mechanics    Forward Lunge 10 reps;3 seconds    Forward Lunge Limitations UE overhead wiht ab set    Other Standing Lumbar Exercises Self traction leaning over tall table 5x 1 min      Lumbar Exercises: Seated   Other Seated Lumbar Exercises core sets x 1 min on theraball    Other Seated Lumbar Exercises Paloff 20x 2 set GTB sitting on theraball      Lumbar Exercises: Supine   Heel Slides 10 reps    Heel Slides Limitations feet on green theraball    Other Supine Lumbar Exercises isometric core sets with theraball 5x 5"    Other Supine Lumbar Exercises oblique with theraball 5x5"      Knee/Hip Exercises: Supine   Other Supine Knee/Hip Exercises Theraball isometric 5x 5"      Manual Therapy   Manual Therapy Manual Traction    Manual therapy comments Self traction instructed    Manual Traction Instructed self traction on high table 5x 1 min; also demonstrated in chair                    PT Short Term Goals - 04/10/21 0931      PT SHORT TERM GOAL #1   Title Patient will be independent with initial HEP and self-management strategies to improve functional outcomes    Time 2    Period Weeks    Status Achieved    Target Date 03/24/21             PT Long Term Goals - 04/29/21 1043      PT LONG TERM GOAL #1   Title Patient will have equal to or > 4+/5 MMT throughout BLE to improve ability to perform functional mobility, stair ambulation and ADLs.  Baseline See MMT    Time 4    Period Weeks    Status Achieved      PT LONG TERM GOAL #2   Title Patient will report at least 75% overall improvement in subjective complaint to indicate improvement in ability to perform ADLs.    Baseline 03/26/21 - patient reports 15% improvement this far. 04/01/21 - patient reports 20% 5/31 reports 60%    Time 4    Period Weeks    Status On-going      PT LONG TERM GOAL #3   Title Patient will be able to maintain tandem stance >30 seconds on foam bilaterally to show improved stability and ability to ambulate outdoors    Baseline 04/01/21 - tandem on foam left foot forward 12 seconds; right foot forward 27 seconds. 5/31 acheived (30 sec bilateral but min sway with RLE forward)    Time 4    Period Weeks    Status Achieved      PT LONG TERM GOAL #4   Title Patient will be able to walk >30 minutes with no increased back pain for walking dog and exercising outdoors.    Baseline 5/3//22 - reports walking about 1/5 of a mile with wife and one small dog but can't go further yet. 5/31 reports 15 min    Time 4    Period Weeks    Status On-going                 Plan - 05/06/21 1025    Clinical Impression Statement Began core strengthenig on theraball, therapist assisted with stability while sitting on dynamic surface.  Instructed self traction to address pressure on lower back with reports of relief.  No reports of increased pain through session.    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ery Low    Rehab Potential Good    PT Frequency 1x / week    PT Duration 4 weeks    PT Treatment/Interventions ADLs/Self Care Home Management;Aquatic Therapy;Canalith  Repostioning;Cryotherapy;Electrical Stimulation;Contrast Bath;Therapeutic exercise;Patient/family education;Orthotic Fit/Training;Manual lymph drainage;Compression bandaging;Taping;Vasopneumatic Device;Joint Manipulations;Splinting;Energy conservation;Spinal Manipulations;Visual/perceptual remediation/compensation;Passive range of motion;Dry needling;Manual techniques;Scar mobilization;Vestibular;Balance training;Neuromuscular re-education;DME Instruction;Gait training;Moist Heat;Traction;Stair training;Functional mobility training;Therapeutic activities;Ultrasound;Parrafin;Fluidtherapy;Biofeedback;Iontophoresis 4mg /ml Dexamethasone    PT Next Visit Plan F/U with MD apt on 05/14/21.  Continue to progress higher level balance, hip and core strengthening as tolerated.    PT Home Exercise Plan Eval: ab set, hip abd/ add iso, bridge, RTB for clams 4/26: pelvic tilt, glute set; 04/03/21 - tandem stance on solid/compliant surfaces with/without head/arm movements.; 5/17: QL stretch 5/19 ab march, sit to stand; 05/06/21: self traction, sitting on theraball, heel slides on theraball.    Consulted and Agree with Plan of Care Patient           Patient will benefit from skilled therapeutic intervention in order to improve the following deficits and impairments:  Pain,Improper body mechanics,Increased fascial restricitons,Abnormal gait,Decreased balance,Impaired flexibility,Decreased activity tolerance,Decreased range of motion,Decreased strength,Decreased mobility,Postural dysfunction  Visit Diagnosis: Chronic midline low back pain with bilateral sciatica  Low back pain, unspecified back pain laterality, unspecified chronicity, unspecified whether sciatica present  Muscle weakness (generalized)     Problem List Patient Active Problem List   Diagnosis Date Noted  . 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  . Vitamin D deficiency 04/16/2020  .  Depression 04/16/2020  . Class 1 obesity with serious  comorbidity and body mass index (BMI) of 33.0 to 33.9 in adult 04/16/2020  . Other fatigue 02/14/2018  . Shortness of breath on exertion 02/14/2018  . Type 2 diabetes mellitus with hyperglycemia, with long-term current use of insulin ([employer]) 02/14/2018  . Abnormal EKG 02/14/2018  . Maxillary sinusitis, acute 12/17/2015  . Cough 12/17/2015  . Fever 12/17/2015  . Diabetes mellitus without complication ([place]) 25/49/8264  . Tooth pain 06/21/2013  . Orthostatic lightheadedness 05/17/2013  . Health maintenance examination 05/17/2013  . Groin strain 11/10/2012  . Hypogonadism, male 01/11/2012  . Type II or unspecified type diabetes mellitus without mention of complication, not stated as uncontrolled 07/08/2011  . HTN (hypertension), benign 07/08/2011  . Hyperlipidemia 07/08/2011  . BPH (benign prostatic hypertrophy) 07/08/2011  . Hx of adenomatous colonic polyps 07/08/2011   Ihor Austin, LPTA/CLT; CBIS [phone]  Aldona Lento 05/06/2021, 10:46 AM  [facility] [address] [place], [place], [address] Phone: [phone]   Fax:  [phone]  Name: [name] [name] MRN: [id number] Date of Birth: 12/29/1950

## 2021-05-08 ENCOUNTER — Encounter (HOSPITAL_COMMUNITY): Payer: Medicare Other

## 2021-05-11 ENCOUNTER — Other Ambulatory Visit: Payer: Self-pay | Admitting: Family Medicine

## 2021-05-13 ENCOUNTER — Other Ambulatory Visit: Payer: Self-pay

## 2021-05-14 DIAGNOSIS — Z6834 Body mass index (BMI) 34.0-34.9, adult: Secondary | ICD-10-CM | POA: Diagnosis not present

## 2021-05-14 DIAGNOSIS — M5442 Lumbago with sciatica, left side: Secondary | ICD-10-CM | POA: Diagnosis not present

## 2021-05-14 DIAGNOSIS — I10 Essential (primary) hypertension: Secondary | ICD-10-CM | POA: Diagnosis not present

## 2021-05-14 DIAGNOSIS — M545 Low back pain, unspecified: Secondary | ICD-10-CM | POA: Diagnosis not present

## 2021-05-16 ENCOUNTER — Other Ambulatory Visit: Payer: Self-pay

## 2021-05-21 ENCOUNTER — Encounter (HOSPITAL_COMMUNITY): Payer: Self-pay | Admitting: Physical Therapy

## 2021-05-21 ENCOUNTER — Ambulatory Visit (HOSPITAL_COMMUNITY): Payer: Medicare Other | Admitting: Physical Therapy

## 2021-05-21 ENCOUNTER — Other Ambulatory Visit: Payer: Self-pay

## 2021-05-21 DIAGNOSIS — M6281 Muscle weakness (generalized): Secondary | ICD-10-CM | POA: Diagnosis not present

## 2021-05-21 DIAGNOSIS — M545 Low back pain, unspecified: Secondary | ICD-10-CM | POA: Diagnosis not present

## 2021-05-21 DIAGNOSIS — G8929 Other chronic pain: Secondary | ICD-10-CM

## 2021-05-21 DIAGNOSIS — M5442 Lumbago with sciatica, left side: Secondary | ICD-10-CM | POA: Diagnosis not present

## 2021-05-21 DIAGNOSIS — M5441 Lumbago with sciatica, right side: Secondary | ICD-10-CM | POA: Diagnosis not present

## 2021-05-21 NOTE — Patient Instructions (Signed)
Access Code: [email or web address] URL: [email or web address] Date: 05/21/2021 Prepared by: Josue Hector  Exercises Supine Transversus Abdominis Bracing - Hands on Stomach - 2 x daily - 7 x weekly - 1 sets - 10 reps - 5 second hold Supine March - 2 x daily - 7 x weekly - 2 sets - 10 reps Supine Straight Leg Raises - 2 x daily - 7 x weekly - 2 sets - 10 reps Supine Posterior Pelvic Tilt - 2 x daily - 7 x weekly - 1 sets - 10 reps - 5 second hold Supine Double Knee to Chest - 2 x daily - 7 x weekly - 1 sets - 10 reps - 5 second hold Sit to Stand Without Arm Support - 2 x daily - 7 x weekly - 2 sets - 10 reps

## 2021-05-21 NOTE — Therapy (Signed)
[facility] [address] [place], [place], [address] Phone: [phone]   Fax:  [phone]  Physical Therapy Treatment  Patient Details  Name: [name] [name] MRN: [id number] Date of Birth: 1951-05-01 Referring Provider (PT): Erline Levine MD  Progress Note Reporting Period 04/29/21 to 05/21/21  See note below for Objective Data and Assessment of Progress/Goals.     Encounter Date: 05/21/2021   PT End of Session - 05/21/21 1127     Visit Number 18    Number of Visits 22    Date for PT Re-Evaluation 06/18/21    Authorization Type Medicare A/ BCBS supplement 2nd (no auth)    Progress Note Due on Visit 22    PT Start Time 1113    PT Stop Time 1200    PT Time Calculation (min) 47 min    Activity Tolerance Patient tolerated treatment well    Behavior During Therapy WFL for tasks assessed/performed             Past Medical History:  Diagnosis Date   Allergic rhinitis, unspecified    Allergy testing via allergist 04/2017--mostly neg.  Dr. Donneta Romberg recommended referral to ENT so i did this.   Back pain    LBP->DDD/spondylosis w/ intermittent radiulopathy-type leg pains, + bilat hip and thigh pain-->spinal stenosis L4-5, L5-S1->Dr. Vertell Limber to do decomp and fusion surgery, scheduled for 12/03/20   BPH (benign prostatic hypertrophy)    no hydro on renal u/s 05/2020   Chronic nasal congestion    DDD (degenerative disc disease), lumbar 2010   laminectomy 01/2009.  MR rpt 05/2019->advanced DDD/spondylosis L3-S1, with mod/sev L4-5 spinal stenosis-->plan for surg per Dr. Vertell Limber as of 09/2020   Depression    Hospitalized for suicidal ideation 1992.  Has been on lexapro since 10/2008.   Diabetes mellitus type II    Dx'd approximately 06/2008.  No retinopathy as of 02/2017 ophth.   Erectile dysfunction    Heart murmur, systolic 61/4431   mild intensity, asymptomatic; echo 01/2018 valves fine->obs   History of colon polyps 01/2016   Recall 3 yrs    History of pneumonia 2008   Hospitalized   History of scarlet fever    age 54   Hyperlipidemia, mixed 1993   myalgias on pravastatin   Hypertension 2009   "marked chronic cardiomegaly" on CXR 01/2009 per old records.   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 Delice Lesch   MRI brain 03/01/20 NORMAL   OSA on CPAP 2004; 2019   Back on CPAP 04/2018.  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  02/21/16: tubular adenoma.  Recall 3 yrs (Dr. Ardis Hughs).   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 Vertell Limber)   TONSILLECTOMY AND ADENOIDECTOMY     age 43   TRANSTHORACIC ECHOCARDIOGRAM  02/22/2018   EF 55-60%, grd I DD   URETHRAL DILATION     2 as a child, one as an adult   [place]   VITRECTOMY      There were no vitals filed for this visit.   Subjective Assessment - 05/21/21 1115     Subjective "Not very good". Had to fly to [facility] this weekend. Sitting made his back hurt. "Sharp pains that switches sides".    Pertinent History Lumbar fusion 12/03/20    Limitations Lifting;Standing;Walking;House hold activities    How long can you walk comfortably? 1/4 mile    Patient Stated Goals Get rid of pain, walk the dogs more, walk longer, improve balance    Currently in Pain? Yes    Pain Score 4     Pain Location Back    Pain  Orientation Lower;Mid;Posterior    Pain Descriptors / Indicators Sharp    Pain Type Surgical pain    Pain Onset 1 to 4 weeks ago    Pain Frequency Intermittent    Aggravating Factors  walking, bending, lifting    Pain Relieving Factors stretching, hot shower    Effect of Pain on Daily Activities Limits                [facility] PT Assessment - 05/21/21 0001       Assessment   Medical Diagnosis LBP    Referring Provider (PT) Erline Levine MD    Onset Date/Surgical Date 12/03/20      Home Environment   Living Environment Private residence    Living Arrangements Spouse/significant other      Prior Function   Level of Independence Independent      Cognition   Overall Cognitive Status Within Functional Limits for tasks assessed      Observation/Other Assessments   Focus on Therapeutic Outcomes (FOTO)  51% function      AROM   Lumbar Flexion 25% limited    Lumbar Extension 40% limited                           [facility] Adult PT Treatment/Exercise - 05/21/21 0001       Lumbar Exercises: Stretches   Double Knee to Chest Stretch 5 reps;10 seconds      Lumbar Exercises: Supine   Pelvic Tilt 10 reps;5 seconds    Straight Leg Raise 10 reps    Straight Leg Raises Limitations each side, with ab set                      PT Short Term Goals - 04/10/21 0931       PT SHORT TERM GOAL #1   Title Patient will be independent with initial HEP and self-management strategies to improve functional outcomes    Time 2    Period Weeks    Status Achieved    Target Date 03/24/21               PT Long Term Goals - 05/21/21 1154       PT LONG TERM GOAL #1   Title Patient will have equal to or > 4+/5 MMT throughout BLE to improve ability to perform functional mobility, stair ambulation and ADLs.    Baseline See MMT    Time 4  Period Weeks    Status Achieved      PT LONG TERM GOAL #2   Title Patient will report at least 75% overall improvement in  subjective complaint to indicate improvement in ability to perform ADLs.    Baseline Current reports 80%    Time 4    Period Weeks    Status Achieved      PT LONG TERM GOAL #3   Title Patient will be able to maintain tandem stance >30 seconds on foam bilaterally to show improved stability and ability to ambulate outdoors    Baseline 04/01/21 - tandem on foam left foot forward 12 seconds; right foot forward 27 seconds. 5/31 acheived (30 sec bilateral but min sway with RLE forward)    Time 4    Period Weeks    Status Achieved      PT LONG TERM GOAL #4   Title Patient will be able to walk >30 minutes with no increased back pain for walking dog and exercising outdoors.    Baseline Currently 1/4 mile, about 10 minutes    Time 4    Period Weeks    Status On-going                   Plan - 05/21/21 1207     Clinical Impression Statement Patient making steady progress. Had recent flare up with travel out of state. Still limited by pain with prolonged positions and walking greater than 10 minutes. Still limited by postural deficits and lumbar mechanics. Reviewed HEP and lumbar anatomy. Plan to continue working lumbar posturing and core strengthening exercises for 4 more weeks to establish comprehensive home exercise plan. Patient agrees with this plan.    Personal Factors and Comorbidities Comorbidity 2    Comorbidities arthritis, DM    Examination-Activity Limitations Lift;Stand;Locomotion Level;Transfers;Carry;Stairs;Bend;Squat    Examination-Participation Restrictions Community Activity;Yard Work    Stability/Clinical Decision Making Stable/Uncomplicated    Rehab Potential Good    PT Frequency 1x / week    PT Duration 4 weeks    PT Treatment/Interventions ADLs/Self Care Home Management;Aquatic Therapy;Canalith Repostioning;Cryotherapy;Electrical Stimulation;Contrast Bath;Therapeutic exercise;Patient/family education;Orthotic Fit/Training;Manual lymph drainage;Compression  bandaging;Taping;Vasopneumatic Device;Joint Manipulations;Splinting;Energy conservation;Spinal Manipulations;Visual/perceptual remediation/compensation;Passive range of motion;Dry needling;Manual techniques;Scar mobilization;Vestibular;Balance training;Neuromuscular re-education;DME Instruction;Gait training;Moist Heat;Traction;Stair training;Functional mobility training;Therapeutic activities;Ultrasound;Parrafin;Fluidtherapy;Biofeedback;Iontophoresis 4mg /ml Dexamethasone    PT Next Visit Plan Continue on with focus on fundamental core stabilization exercise for home program (patient wants routine simplified, has too many different exercises). Focus on lumbar alignment (partial to lumbar flexion)    PT Home Exercise Plan Eval: ab set, hip abd/ add iso, bridge, RTB for clams 4/26: pelvic tilt, glute set; 04/03/21 - tandem stance on solid/compliant surfaces with/without head/arm movements.; 5/17: QL stretch 5/19 ab march, sit to stand; 05/06/21: self traction, sitting on theraball, heel slides on theraball.6/22 Supine Transversus Abdominis Bracing - Hands on Stomach - 2 x daily - 7 x weekly - 1 sets - 10 reps - 5 second hold  Supine March - 2 x daily - 7 x weekly - 2 sets - 10 reps  Supine Straight Leg Raises - 2 x daily - 7 x weekly - 2 sets - 10 reps  Supine Posterior Pelvic Tilt - 2 x daily - 7 x weekly - 1 sets - 10 reps - 5 second hold  Supine Double Knee to Chest - 2 x daily - 7 x weekly - 1 sets - 10 reps - 5 second hold  Sit to Stand Without Arm Support    Consulted  and Agree with Plan of Care Patient             Patient will benefit from skilled therapeutic intervention in order to improve the following deficits and impairments:  Pain, Improper body mechanics, Increased fascial restricitons, Abnormal gait, Decreased balance, Impaired flexibility, Decreased activity tolerance, Decreased range of motion, Decreased strength, Decreased mobility, Postural dysfunction  Visit Diagnosis: Chronic midline low  back pain with bilateral sciatica  Low back pain, unspecified back pain laterality, unspecified chronicity, unspecified whether sciatica present  Muscle weakness (generalized)     Problem List Patient Active Problem List   Diagnosis Date Noted   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   Lumbago with sciatica, right side 09/02/2020   Chronic bilateral low back pain with bilateral sciatica 09/02/2020   Vitamin D deficiency 04/16/2020   Depression 04/16/2020   Class 1 obesity with serious comorbidity and body mass index (BMI) of 33.0 to 33.9 in adult 04/16/2020   Other fatigue 02/14/2018   Shortness of breath on exertion 02/14/2018   Type 2 diabetes mellitus with hyperglycemia, with long-term current use of insulin (HCC) 02/14/2018   Abnormal EKG 02/14/2018   Maxillary sinusitis, acute 12/17/2015   Cough 12/17/2015   Fever 12/17/2015   Diabetes mellitus without complication (HCC) 56/38/7564   Tooth pain 06/21/2013   Orthostatic lightheadedness 05/17/2013   Health maintenance examination 05/17/2013   Groin strain 11/10/2012   Hypogonadism, male 01/11/2012   Type II or unspecified type diabetes mellitus without mention of complication, not stated as uncontrolled 07/08/2011   HTN (hypertension), benign 07/08/2011   Hyperlipidemia 07/08/2011   BPH (benign prostatic hypertrophy) 07/08/2011   Hx of adenomatous colonic polyps 07/08/2011   12:11 PM, 05/21/21 Josue Hector PT DPT  Physical Therapist with [facility]  [phone] [place], [place], [address] Phone: [phone]   Fax:  [phone]  Name: DAKARRI KESSINGER MRN: [id number] Date of Birth: 10-03-1951

## 2021-05-22 ENCOUNTER — Encounter: Payer: Self-pay | Admitting: Family Medicine

## 2021-05-22 ENCOUNTER — Ambulatory Visit (INDEPENDENT_AMBULATORY_CARE_PROVIDER_SITE_OTHER): Payer: Medicare Other | Admitting: Family Medicine

## 2021-05-22 VITALS — BP 110/67 | HR 67 | Temp 97.6°F | Resp 16 | Ht 70.0 in | Wt 244.4 lb

## 2021-05-22 DIAGNOSIS — Z0000 Encounter for general adult medical examination without abnormal findings: Secondary | ICD-10-CM | POA: Diagnosis not present

## 2021-05-22 DIAGNOSIS — F32A Depression, unspecified: Secondary | ICD-10-CM

## 2021-05-22 DIAGNOSIS — E782 Mixed hyperlipidemia: Secondary | ICD-10-CM | POA: Diagnosis not present

## 2021-05-22 DIAGNOSIS — N138 Other obstructive and reflux uropathy: Secondary | ICD-10-CM | POA: Diagnosis not present

## 2021-05-22 DIAGNOSIS — R011 Cardiac murmur, unspecified: Secondary | ICD-10-CM | POA: Diagnosis not present

## 2021-05-22 DIAGNOSIS — F419 Anxiety disorder, unspecified: Secondary | ICD-10-CM

## 2021-05-22 DIAGNOSIS — E119 Type 2 diabetes mellitus without complications: Secondary | ICD-10-CM

## 2021-05-22 DIAGNOSIS — Z125 Encounter for screening for malignant neoplasm of prostate: Secondary | ICD-10-CM

## 2021-05-22 DIAGNOSIS — N401 Enlarged prostate with lower urinary tract symptoms: Secondary | ICD-10-CM

## 2021-05-22 LAB — CBC WITH DIFFERENTIAL/PLATELET
Basophils Absolute: 0 10*3/uL (ref 0.0–0.1)
Basophils Relative: 0.6 % (ref 0.0–3.0)
Eosinophils Absolute: 0.2 10*3/uL (ref 0.0–0.7)
Eosinophils Relative: 3.1 % (ref 0.0–5.0)
HCT: 37.1 % — ABNORMAL LOW (ref 39.0–52.0)
Hemoglobin: 12.4 g/dL — ABNORMAL LOW (ref 13.0–17.0)
Lymphocytes Relative: 31.3 % (ref 12.0–46.0)
Lymphs Abs: 1.8 10*3/uL (ref 0.7–4.0)
MCHC: 33.5 g/dL (ref 30.0–36.0)
MCV: 85 fl (ref 78.0–100.0)
Monocytes Absolute: 0.5 10*3/uL (ref 0.1–1.0)
Monocytes Relative: 7.9 % (ref 3.0–12.0)
Neutro Abs: 3.3 10*3/uL (ref 1.4–7.7)
Neutrophils Relative %: 57.1 % (ref 43.0–77.0)
Platelets: 284 10*3/uL (ref 150.0–400.0)
RBC: 4.37 Mil/uL (ref 4.22–5.81)
RDW: 15.2 % (ref 11.5–15.5)
WBC: 5.8 10*3/uL (ref 4.0–10.5)

## 2021-05-22 LAB — PSA, MEDICARE: PSA: 0.02 ng/ml — ABNORMAL LOW (ref 0.10–4.00)

## 2021-05-22 LAB — PSA SCREENING: PSA: 0.02 ng/mL

## 2021-05-22 MED ORDER — LEVEMIR FLEXTOUCH 100 UNIT/ML ~~LOC~~ SOPN: 50.0000 [IU] | PEN_INJECTOR | Freq: Every evening | SUBCUTANEOUS | 6 refills | Status: DC

## 2021-05-22 NOTE — Patient Instructions (Addendum)
Contact Dr. Ardis Hughs' office to schedule your colonoscopy. [phone]   You will be getting contacted to schedule an echocardiogram.  You will be getting contacted sometime in the next 10 days or so to set up an appt with a urologist.

## 2021-05-22 NOTE — Progress Notes (Signed)
Office Note 05/22/2021  CC:  Chief Complaint  Patient presents with   Annual Exam    Pt is fasting    HPI:  [name] [name] is a 70 y.o. White male who is here for annual health maintenance exam and 6 mo f/u DM 2, mixed HLD, recurrent MDD + anxiety. A/P as of last visit: "1) DM, well controlled. Cont metformin 1000 mg bid, ozempic 1mg  q week, levemir 56 U qd. Bariatric clinic has been following his labs and he follows up with them regularly. No labs today.   2) HLD: tolerating atorva 80mg  qd and fenofibrate 135mg  qd. [facility] lipid panel 08/29/20 excellent.   3) GAD and recurrent MDD: stable long term on buprop sr 150mg  bid and lexapro 20mg  qd. Continue.   4) Chronic LBP/spondylosis/stenosis--->for surg 12/03/20 with Dr. Vertell Limber.   5) Systolic heart murmur: asymptomatic. Present back in 2019 when echo showed no valvular abnormality. Plan f/u echo later in 2022.   6) Preventative health: All vaccines UTD. Prostate ca screening UTD--repeat PSA 6 mo. Colon ca screening: Hx of adenomatous colon polyps: due for repeat colonoscopy as of 2020---he's been holding off while getting back issue worked out. Likely will seek to get rpt colonoscopy latter part of 2022.   7) Vasomotor rhinitis: discussed at tail end of visit today. Decided to try astelin nasal spray 2 sprays each nostril bid."  INTERIM HX: Doing pretty good, back surgery went pretty good.  Legs pain is gone, still some pressure in LB. Doing PT and will start aquatherapy next month.    Bariatric clinic has been following his labs and he follows up with them regularly, most recently 04/21/21. Taking 56 U levemir qd, metformin, and bariatrics is trying to get him back on ozempic for help with wt loss as well as DM. Typically fasting glucoses 80s-90s. One reading as low as 70.  Low 100s last couple weeks.  No checks later in the day. Feet: some numbness in a few toes of L foot, otherwise no feet sx's.  Still with  frequent urination and many times only a little comes out, sometimes empties well.  No dysuria. Taking proscar 5 qd and flomax 0.8 mg qd.  About 2 wks ago he sneezed forcefully and it caused pain in L lateral ribs area, lower portion.  Noted the pain intensely for a week and now feels it dissipating signif--wonders if this is ok.  Mood and anxiety levels stable on wellbutrin sr and lexapro.   Past Medical History:  Diagnosis Date   Allergic rhinitis, unspecified    Allergy testing via allergist 04/2017--mostly neg.  Dr. Donneta Romberg recommended referral to ENT so i did this.   Back pain    LBP->DDD/spondylosis w/ intermittent radiulopathy-type leg pains, + bilat hip and thigh pain-->spinal stenosis L4-5, L5-S1->Dr. Vertell Limber to do decomp and fusion surgery, scheduled for 12/03/20   BPH (benign prostatic hypertrophy)    no hydro on renal u/s 05/2020   Chronic nasal congestion    DDD (degenerative disc disease), lumbar 2010   laminectomy 01/2009.  MR rpt 05/2019->advanced DDD/spondylosis L3-S1, with mod/sev L4-5 spinal stenosis-->plan for surg per Dr. Vertell Limber as of 09/2020   Depression    Hospitalized for suicidal ideation 1992.  Has been on lexapro since 10/2008.   Diabetes mellitus type II    Dx'd approximately 06/2008.  No retinopathy as of 02/2017 ophth.   Erectile dysfunction    Heart murmur, systolic 97/4163   mild intensity, asymptomatic; echo 01/2018 valves fine->obs  History of colon polyps 01/2016   Recall 3 yrs   History of pneumonia 2008   Hospitalized   History of scarlet fever    age 77   Hyperlipidemia, mixed 1993   myalgias on pravastatin   Hypertension 2009   "marked chronic cardiomegaly" on CXR 01/2009 per old records.   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 Delice Lesch   MRI brain 03/01/20 NORMAL   OSA on CPAP 2004; 2019   Back on CPAP  04/2018.  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  02/21/16: tubular adenoma.  Recall 3 yrs (Dr. Ardis Hughs).   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 Vertell Limber)   [place]     age 62   TRANSTHORACIC ECHOCARDIOGRAM  02/22/2018   EF 55-60%, grd I DD   URETHRAL DILATION     2 as a child, one as an adult   VASECTOMY  1994   VITRECTOMY      Family History  Problem Relation Age of Onset   Heart disease Mother    Hyperlipidemia Mother    Stroke Mother    Diabetes Mother    Depression Mother    Alzheimer's disease Mother    Stroke Father    Hyperlipidemia Father    Heart disease Father    Diabetes Father    Hypertension Father    Obesity Father    Alcohol abuse Sister    Depression Sister     Social History   Socioeconomic History   Marital status: Married    Spouse name: Jaxton Casale   Number of children: Not on file   Years of education: Not on file   Highest education level: Not on file  Occupational History   Occupation: Retired  Tobacco Use   Smoking status: Former    Pack years: 0.00   Smokeless tobacco: Never  Vaping Use   Vaping Use: Never used  Substance and Sexual Activity   Alcohol use: Yes    Comment: social, 3-4 drinks month   Drug use: No   Sexual activity: Yes    Partners: Female  Other Topics Concern   Not on file  Social History Narrative   Divorced, then remarried.  Has 3 sons, no grandchildren.    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   No tobacco.  Rare ETOH.  No drug abuse.   Enjoys reading and spending time with his 2 dogs.   Right handed    Social Determinants of Health   Financial Resource Strain: Not on file  Food Insecurity: Not on file  Transportation Needs: Not on file  Physical Activity: Not on file  Stress: Not on file  Social Connections: Not on file  Intimate Partner Violence: Not on file    Outpatient Medications Prior to Visit  Medication Sig Dispense Refill   aspirin 81 MG EC tablet Take 1 tablet (81 mg total) by mouth daily. 30 tablet 12   atorvastatin (LIPITOR) 80 MG tablet TAKE 1 TABLET(80 MG) BY MOUTH DAILY 90 tablet 0   azelastine (ASTELIN) 0.1 % nasal spray Place 2 sprays into both nostrils 2 (two) times daily. Use in each nostril as directed 30 mL 11   buPROPion (WELLBUTRIN SR) 150 MG 12 hr tablet Take 1 tablet (150 mg total) by mouth 2 (two) times daily. 180 tablet 0   Choline Fenofibrate (FENOFIBRIC ACID) 135 MG CPDR TAKE 1 CAPSULE BY MOUTH DAILY 90 capsule 0   escitalopram (LEXAPRO) 20 MG tablet TAKE 1 TABLET(20 MG) BY MOUTH DAILY 90 tablet 0   finasteride (PROSCAR) 5 MG tablet TAKE 1 TABLET(5 MG) BY MOUTH DAILY 90 tablet 0   fluticasone (FLONASE) 50 MCG/ACT nasal spray Place 2 sprays into both nostrils daily. (Patient taking differently: Place 2 sprays into both nostrils daily as needed for allergies.) 48 g 1   gabapentin (NEURONTIN) 300 MG capsule Take 900 mg by mouth at bedtime.     glucose blood (ONE TOUCH ULTRA TEST) test strip 1 each by Other route 3 (three) times daily. Test sugars up to three times daily. ( insurance preference  DX E11.65 E11.9) 100 each 11   glucose blood test strip Per Insurance Preference- Test sugars up to three times daily DX E11.9 100 each 11   HYDROcodone-acetaminophen (NORCO/VICODIN) 5-325 MG tablet Take 1 tablet by mouth every 6 (six) hours as  needed.     Insulin Pen Needle (BD PEN NEEDLE NANO 2ND GEN) 32G X 4 MM MISC USE TO INJECT 4 TIMES DAILY 100 each 2   Krill Oil 500 MG CAPS Take 500 mg by mouth daily.     metFORMIN (GLUCOPHAGE) 1000 MG tablet TAKE 1 TABLET BY MOUTH TWICE DAILY WITH A MEAL 180 tablet 1   methocarbamol (ROBAXIN) 500 MG tablet Take by mouth as needed.     Semaglutide, 2 MG/DOSE, (OZEMPIC, 2 MG/DOSE,) 8 MG/3ML SOPN Inject 2 mg into the skin once a week. 9 mL 0   sodium chloride (OCEAN) 0.65 % nasal spray Place 1 spray into the nose daily as needed.     tamsulosin (FLOMAX) 0.4 MG CAPS capsule TAKE 2 CAPSULES(0.8 MG) BY MOUTH AT BEDTIME 180 capsule 0   vitamin B-12 (CYANOCOBALAMIN) 1000 MCG tablet Take 2,000 mcg by mouth daily.     Vitamin D, Ergocalciferol, (DRISDOL) 1.25 MG (50000 UNIT) CAPS capsule Take 1 capsule (50,000 Units total) by mouth every 7 (seven) days. 12 capsule 0   insulin detemir (LEVEMIR FLEXTOUCH) 100 UNIT/ML FlexPen Inject 56 Units into the skin every evening. ADMINISTER 56 UNITS UNDER THE SKIN EVERY DAY 15 mL 6   methocarbamol (ROBAXIN) 500 MG tablet Take 500 mg by mouth 4 (four) times daily. (Patient not taking: Reported on 05/22/2021)     No facility-administered medications prior to visit.    Allergies  Allergen Reactions   Simvastatin Other (See Comments)    myalgias   Antihistamines, Diphenhydramine-Type Other (See Comments)    depression   Codeine Nausea Only   Penicillins Hives   Sulfa Antibiotics Hives    ROS Review of Systems  Constitutional:  Negative  for appetite change, chills, fatigue and fever.  HENT:  Negative for congestion, dental problem, ear pain and sore throat.   Eyes:  Negative for discharge, redness and visual disturbance.  Respiratory:  Negative for cough, chest tightness, shortness of breath and wheezing.   Cardiovascular:  Negative for chest pain, palpitations and leg swelling.  Gastrointestinal:  Negative for abdominal pain, blood in stool, diarrhea, nausea  and vomiting.  Genitourinary:  Negative for difficulty urinating, dysuria, flank pain, frequency, hematuria and urgency.  Musculoskeletal:  Positive for back pain (chronic---improved). Negative for arthralgias, joint swelling, myalgias and neck stiffness.  Skin:  Negative for pallor and rash.  Neurological:  Negative for dizziness, speech difficulty, weakness and headaches.  Hematological:  Negative for adenopathy. Does not bruise/bleed easily.  Psychiatric/Behavioral:  Negative for confusion and sleep disturbance. The patient is not nervous/anxious.    PE; Vitals with BMI 05/22/2021 04/21/2021 03/24/2021  Height 5\' 10"  5\' 10"  5\' 10"   Weight 244 lbs 6 oz 227 lbs 227 lbs  BMI 35.07 32.95 18.84  Systolic 166 063 016  Diastolic 67 62 64  Pulse 67 79 81   Gen: Alert, well appearing.  Patient is oriented to person, place, time, and situation. AFFECT: pleasant, lucid thought and speech. ENT: Ears: EACs clear, normal epithelium.  TMs with good light reflex and landmarks bilaterally.  Eyes: no injection, icteris, swelling, or exudate.  EOMI, PERRLA. Nose: no drainage or turbinate edema/swelling.  No injection or focal lesion.  Mouth: lips without lesion/swelling.  Oral mucosa pink and moist.  Dentition intact and without obvious caries or gingival swelling.  Oropharynx without erythema, exudate, or swelling.  Neck: supple/nontender.  No LAD, mass, or TM.  Carotid pulses 2+ bilaterally, without bruits. CV: RRR, 2/6 systolic murmur heard best at RUSB.  No diastolic murmur, no r/g.   LUNGS: CTA bilat, nonlabored resps, good aeration in all lung fields. ABD: soft, NT, ND, BS normal.  No hepatospenomegaly or mass.  No bruits. EXT: no clubbing, cyanosis, or edema.  Musculoskeletal: no joint swelling, erythema, warmth, or tenderness.  ROM of all joints intact. Skin - no sores or suspicious lesions or rashes or color changes Foot exam - no swelling, tenderness or skin or vascular lesions. Color and  temperature is normal. Sensation is intact. Peripheral pulses are palpable. Toenails are normal.   Pertinent labs:  Lab Results  Component Value Date   TSH 1.25 02/05/2020   Lab Results  Component Value Date   WBC 6.4 12/02/2020   HGB 12.2 (L) 12/02/2020   HCT 38.3 (L) 12/02/2020   MCV 88.5 12/02/2020   PLT 271 12/02/2020  No results found for: IRON, TIBC, FERRITIN Lab Results  Component Value Date   VITAMINB12 1,159 (H) 02/05/2020    Lab Results  Component Value Date   CREATININE 0.96 04/21/2021   BUN 23 04/21/2021   NA 138 04/21/2021   K 4.1 04/21/2021   CL 100 04/21/2021   CO2 24 04/21/2021   Lab Results  Component Value Date   ALT 19 04/21/2021   AST 12 04/21/2021   ALKPHOS 37 (L) 04/21/2021   BILITOT 0.4 04/21/2021   Lab Results  Component Value Date   CHOL 134 04/21/2021   Lab Results  Component Value Date   HDL 46 04/21/2021   Lab Results  Component Value Date   LDLCALC 69 04/21/2021   Lab Results  Component Value Date   TRIG 101 04/21/2021   Lab Results  Component Value Date  CHOLHDL 2.9 04/21/2021   Lab Results  Component Value Date   PSA 0.08 (L) 05/16/2020   PSA 0.04 (L) 05/19/2019   PSA 0.04 (L) 05/09/2018   Lab Results  Component Value Date   HGBA1C 5.9 (H) 04/21/2021   ASSESSMENT AND PLAN:   1) DM 2.  Glucoses excellent, even a couple bordering on hypoglycemia. Wt gain->? Related to high dose of levemir. Lets dec levemir from 56 to 50 U qd.  I'll have him check to see if his insurer covers a SGL2-I. If we add one of these I will decrease lantus to 45 U.  Cont metformin.  Bariatric clinic adding ozempic soon. Next a1c around 07/22/21.  2) HLD: tolerating 80 atorva qd and fenofibrate 135 mg qd. LDL 69, trigs 101 on 04/21/21.  Hepatic panel normal at that time. Next lipid panel check 6-12 mo.  3) Anx/dep: stable on bupropion and lexapro.  4) BPH with very significant LUT obstructive sx's despite being on flomax and  finasteride. Referred to urology today.  5) Systolic heart murmur: asymptomatic. Present back in 2019 when echo showed no valvular abnormality. Plan f/u echo later in 2022-->ordered.   6) Health maintenance exam: Reviewed age and gender appropriate health maintenance issues (prudent diet, regular exercise, health risks of tobacco and excessive alcohol, use of seatbelts, fire alarms in home, use of sunscreen).  Also reviewed age and gender appropriate health screening as well as vaccine recommendations. Vaccines: ALL UTD. Labs: bariatric clinic has been following lipids, W0J, and metabolic panels, most recent 04/21/21.  Will do cbc today. Prostate ca screening: PSA today. Colon ca screening:  Hx of adenomatous colon polyps: due for repeat colonoscopy as of 2020---he's been holding off while getting back issue worked out. I gave him [facility] contact # so he could call to arrange this.  An After Visit Summary was printed and given to the patient.  FOLLOW UP:  Return in about 6 months (around 11/21/2021) for routine chronic illness f/u.  Signed:  Crissie Sickles, MD           05/22/2021

## 2021-05-26 ENCOUNTER — Encounter (INDEPENDENT_AMBULATORY_CARE_PROVIDER_SITE_OTHER): Payer: Self-pay | Admitting: Physician Assistant

## 2021-05-26 ENCOUNTER — Other Ambulatory Visit: Payer: Self-pay

## 2021-05-26 ENCOUNTER — Ambulatory Visit (INDEPENDENT_AMBULATORY_CARE_PROVIDER_SITE_OTHER): Payer: Medicare Other | Admitting: Physician Assistant

## 2021-05-26 VITALS — BP 121/66 | HR 102 | Temp 98.1°F | Ht 70.0 in | Wt 237.0 lb

## 2021-05-26 DIAGNOSIS — Z794 Long term (current) use of insulin: Secondary | ICD-10-CM

## 2021-05-26 DIAGNOSIS — E559 Vitamin D deficiency, unspecified: Secondary | ICD-10-CM | POA: Diagnosis not present

## 2021-05-26 DIAGNOSIS — E669 Obesity, unspecified: Secondary | ICD-10-CM

## 2021-05-26 DIAGNOSIS — E119 Type 2 diabetes mellitus without complications: Secondary | ICD-10-CM | POA: Diagnosis not present

## 2021-05-26 DIAGNOSIS — Z6834 Body mass index (BMI) 34.0-34.9, adult: Secondary | ICD-10-CM

## 2021-05-26 MED ORDER — VITAMIN D (ERGOCALCIFEROL) 1.25 MG (50000 UNIT) PO CAPS: 50000.0000 [IU] | ORAL_CAPSULE | ORAL | 0 refills | Status: DC

## 2021-05-26 MED ORDER — OZEMPIC (2 MG/DOSE) 8 MG/3ML ~~LOC~~ SOPN: 2.0000 mg | PEN_INJECTOR | SUBCUTANEOUS | 0 refills | Status: DC

## 2021-05-26 MED ORDER — VITAMIN D3 125 MCG (5000 UT) PO CAPS: 5000.0000 [IU] | ORAL_CAPSULE | Freq: Every day | ORAL | 0 refills | Status: DC

## 2021-05-27 ENCOUNTER — Encounter (INDEPENDENT_AMBULATORY_CARE_PROVIDER_SITE_OTHER): Payer: Self-pay

## 2021-05-28 ENCOUNTER — Other Ambulatory Visit: Payer: Self-pay

## 2021-05-28 ENCOUNTER — Ambulatory Visit (HOSPITAL_COMMUNITY): Payer: Medicare Other

## 2021-05-28 ENCOUNTER — Encounter (HOSPITAL_COMMUNITY): Payer: Self-pay

## 2021-05-28 DIAGNOSIS — M6281 Muscle weakness (generalized): Secondary | ICD-10-CM | POA: Diagnosis not present

## 2021-05-28 DIAGNOSIS — G8929 Other chronic pain: Secondary | ICD-10-CM | POA: Diagnosis not present

## 2021-05-28 DIAGNOSIS — M545 Low back pain, unspecified: Secondary | ICD-10-CM

## 2021-05-28 DIAGNOSIS — M5441 Lumbago with sciatica, right side: Secondary | ICD-10-CM | POA: Diagnosis not present

## 2021-05-28 DIAGNOSIS — M5442 Lumbago with sciatica, left side: Secondary | ICD-10-CM | POA: Diagnosis not present

## 2021-05-28 NOTE — Therapy (Signed)
[facility] [address] [place], [place], [address] Phone: [phone]   Fax:  [phone]  Physical Therapy Treatment  Patient Details  Name: [name] [name] MRN: [id number] Date of Birth: 14-Feb-1951 Referring Provider (PT): Erline Levine MD   Encounter Date: 05/28/2021   PT End of Session - 05/28/21 0838     Visit Number 19    Number of Visits 22    Date for PT Re-Evaluation 06/18/21    Authorization Type Medicare A/ BCBS supplement 2nd (no auth)    Progress Note Due on Visit 22    PT Start Time 0830    PT Stop Time 0908    PT Time Calculation (min) 38 min    Activity Tolerance Patient tolerated treatment well    Behavior During Therapy [facility] for tasks assessed/performed             Past Medical History:  Diagnosis Date   Allergic rhinitis, unspecified    Allergy testing via allergist 04/2017--mostly neg.  Dr. Donneta Romberg recommended referral to ENT so i did this.   Back pain    LBP->DDD/spondylosis w/ intermittent radiulopathy-type leg pains, + bilat hip and thigh pain-->spinal stenosis L4-5, L5-S1->Dr. Vertell Limber to do decomp and fusion surgery, scheduled for 12/03/20   BPH (benign prostatic hypertrophy)    no hydro on renal u/s 05/2020   Chronic nasal congestion    DDD (degenerative disc disease), lumbar 2010   laminectomy 01/2009.  MR rpt 05/2019->advanced DDD/spondylosis L3-S1, with mod/sev L4-5 spinal stenosis-->plan for surg per Dr. Vertell Limber as of 09/2020   Depression    Hospitalized for suicidal ideation 1992.  Has been on lexapro since 10/2008.   Diabetes mellitus type II    Dx'd approximately 06/2008.  No retinopathy as of 02/2017 ophth.   Erectile dysfunction    Heart murmur, systolic 15/9458   mild intensity, asymptomatic; echo 01/2018 valves fine->obs   History of colon polyps 01/2016   Recall 3 yrs   History of pneumonia 2008   Hospitalized   History of scarlet fever    age 70   Hyperlipidemia, mixed 1993   myalgias on  pravastatin   Hypertension 2009   "marked chronic cardiomegaly" on CXR 01/2009 per old records.   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 Delice Lesch   MRI brain 03/01/20 NORMAL   OSA on CPAP 2004; 2019   Back on CPAP 04/2018.  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  02/21/16: tubular adenoma.  Recall 3 yrs (Dr. Ardis Hughs).   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 Vertell Limber)   TONSILLECTOMY AND ADENOIDECTOMY     age 70  TRANSTHORACIC ECHOCARDIOGRAM  02/22/2018   EF 55-60%, grd I DD   URETHRAL DILATION     2 as a child, one as an adult   [place]   VITRECTOMY      There were no vitals filed for this visit.   Subjective Assessment - 05/28/21 0836     Subjective Pt stated he is feeling good today, reports he completed his stretches that helped this morning.    Patient Stated Goals Get rid of pain, walk the dogs more, walk longer, improve balance    Currently in Pain? Yes    Pain Score 1     Pain Location Back    Pain Orientation Lower    Pain Descriptors / Indicators Aching;Sore    Pain Type Surgical pain    Pain Onset More than a month ago    Pain Frequency Intermittent    Aggravating Factors  walking, bending, lifting    Pain Relieving Factors stretching, hot shower, self traction    Effect of Pain on Daily  Activities Limits                               [facility] Adult PT Treatment/Exercise - 05/28/21 0001       Lumbar Exercises: Stretches   Single Knee to Chest Stretch 2 reps;30 seconds    Other Lumbar Stretch Exercise Child's pose 30" each direction      Lumbar Exercises: Standing   Heel Raises 15 reps    Heel Raises Limitations y lift off with heel raise 5" holds    Functional Squats 15 reps    Functional Squats Limitations 3D hip excursion    Forward Lunge 15 reps;3 seconds    Forward Lunge Limitations UE overhead wiht ab set      Lumbar Exercises: Supine   Bridge 15 reps    Bridge Limitations no discomfort      Lumbar Exercises: Prone   Single Arm Raise 10 reps    Straight Leg Raise 10 reps    Opposite Arm/Leg Raise Right arm/Left leg;Left arm/Right leg;10 reps;3 seconds                      PT Short Term Goals - 04/10/21 0931       PT SHORT TERM GOAL #1   Title Patient will be independent with initial HEP and self-management strategies to improve functional outcomes    Time 2    Period Weeks    Status Achieved    Target Date 03/24/21               PT Long Term Goals - 05/21/21 1154       PT LONG TERM GOAL #1   Title Patient will have equal to or > 4+/5 MMT throughout BLE to improve ability to perform functional mobility, stair ambulation and ADLs.    Baseline See MMT    Time 4    Period Weeks    Status Achieved      PT LONG TERM GOAL #2   Title Patient will report at least 75% overall improvement in subjective complaint to indicate improvement in ability to perform ADLs.    Baseline Current reports 80%    Time 4    Period Weeks    Status Achieved      PT LONG TERM GOAL #3   Title Patient will be able to maintain tandem stance >30  seconds on foam bilaterally to show improved stability and ability to ambulate outdoors    Baseline 04/01/21 - tandem on foam left foot forward 12 seconds; right foot forward 27 seconds. 5/31  acheived (30 sec bilateral but min sway with RLE forward)    Time 4    Period Weeks    Status Achieved      PT LONG TERM GOAL #4   Title Patient will be able to walk >30 minutes with no increased back pain for walking dog and exercising outdoors.    Baseline Currently 1/4 mile, about 10 minutes    Time 4    Period Weeks    Status On-going                   Plan - 05/28/21 0844     Clinical Impression Statement Continued session focus with lumbar mobility and strengthening postural and proximal mm.  Added wall arch, prone SAR/SLR for postural strengthening wtih good coordination complete and leg press for LE strengthening.  Discussed current HEP as pt has a big packet and simplified current exercises to complete at home.  Also discussed beginning gym membership upon discharge, paperwork given for membership details.   No reports of pain through session, reports he feels    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ery Low    Rehab Potential Good    PT Frequency 1x / week    PT Duration 4 weeks    PT Treatment/Interventions ADLs/Self Care Home Management;Aquatic Therapy;Canalith Repostioning;Cryotherapy;Electrical Stimulation;Contrast Bath;Therapeutic exercise;Patient/family education;Orthotic Fit/Training;Manual lymph drainage;Compression bandaging;Taping;Vasopneumatic Device;Joint Manipulations;Splinting;Energy conservation;Spinal Manipulations;Visual/perceptual remediation/compensation;Passive range of motion;Dry needling;Manual techniques;Scar mobilization;Vestibular;Balance training;Neuromuscular re-education;DME Instruction;Gait training;Moist Heat;Traction;Stair training;Functional mobility  training;Therapeutic activities;Ultrasound;Parrafin;Fluidtherapy;Biofeedback;Iontophoresis 4mg /ml Dexamethasone    PT Next Visit Plan Continue on with focus on fundamental core stabilization exercise for home program (patient wants routine simplified, has too many different exercises). Focus on lumbar alignment (partial to lumbar flexion)    PT Home Exercise Plan Eval: ab set, hip abd/ add iso, bridge, RTB for clams 4/26: pelvic tilt, glute set; 04/03/21 - tandem stance on solid/compliant surfaces with/without head/arm movements.; 5/17: QL stretch 5/19 ab march, sit to stand; 05/06/21: self traction, sitting on theraball, heel slides on theraball.6/22 Supine Transversus Abdominis Bracing - Hands on Stomach - 2 x daily - 7 x weekly - 1 sets - 10 reps - 5 second hold  Supine March - 2 x daily - 7 x weekly - 2 sets - 10 reps  Supine Straight Leg Raises - 2 x daily - 7 x weekly - 2 sets - 10 reps  Supine Posterior Pelvic Tilt - 2 x daily - 7 x weekly - 1 sets - 10 reps - 5 second hold  Supine Double Knee to Chest - 2 x daily - 7 x weekly - 1 sets - 10 reps - 5 second hold  Sit to Stand Without Arm Support    Consulted and Agree with Plan of Care Patient             Patient will benefit from skilled therapeutic intervention in order to improve the following deficits and impairments:  Pain, Improper body mechanics, Increased fascial restricitons, Abnormal gait, Decreased balance, Impaired flexibility, Decreased activity tolerance, Decreased range of motion, Decreased strength, Decreased mobility, Postural dysfunction  Visit Diagnosis: Chronic midline  low back pain with bilateral sciatica  Low back pain, unspecified back pain laterality, unspecified chronicity, unspecified whether sciatica present  Muscle weakness (generalized)     Problem List Patient Active Problem List   Diagnosis Date Noted   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   Lumbago with sciatica, right side 09/02/2020   Chronic  bilateral low back pain with bilateral sciatica 09/02/2020   Vitamin D deficiency 04/16/2020   Depression 04/16/2020   Class 1 obesity with serious comorbidity and body mass index (BMI) of 33.0 to 33.9 in adult 04/16/2020   Other fatigue 02/14/2018   Shortness of breath on exertion 02/14/2018   Type 2 diabetes mellitus with hyperglycemia, with long-term current use of insulin ([place]) 02/14/2018   Abnormal EKG 02/14/2018   Maxillary sinusitis, acute 12/17/2015   Cough 12/17/2015   Fever 12/17/2015   Diabetes mellitus without complication ([place]) 26/83/4196   Tooth pain 06/21/2013   Orthostatic lightheadedness 05/17/2013   Health maintenance examination 05/17/2013   Groin strain 11/10/2012   Hypogonadism, male 01/11/2012   Type II or unspecified type diabetes mellitus without mention of complication, not stated as uncontrolled 07/08/2011   HTN (hypertension), benign 07/08/2011   Hyperlipidemia 07/08/2011   BPH (benign prostatic hypertrophy) 07/08/2011   Hx of adenomatous colonic polyps 07/08/2011   Ihor Austin, LPTA/CLT; CBIS [phone]  Aldona Lento 05/28/2021, 9:16 AM  [facility] [address] [place], [place], [address] Phone: [phone]   Fax:  [phone]  Name: [name] [name] MRN: [id number] Date of Birth: [birth year]

## 2021-05-28 NOTE — Progress Notes (Signed)
Chief Complaint:   OBESITY Raylan is here to discuss his progress with his obesity treatment plan along with follow-up of his obesity related diagnoses. Holman is on the Category 4 Plan and states he is following his eating plan approximately 50% of the time. Eleuterio states he is doing physical therapy and stretcging for 45 minutes 1 time per week, and exercises for 15-20 minutes 5 times per week.  Today's visit was #: 57 Starting weight: 273 lbs Starting date: 02/14/2018 Today's weight: 237 lbs Today's date: 05/26/2021 Total lbs lost to date: 36 Total lbs lost since last in-office visit: 0  Interim History: Vaishnav has not been taking a GLP-1 in the last month, and he reports his diet went "out of the window". He has been stressed with family situations and a recent family death. He is up 5 lbs of water weight.  Subjective:   1. Type 2 diabetes mellitus without complication, with long-term current use of insulin (HCC) Raquon's last A1c was 5.9, well controlled. His Levemir was decreased to 50 units by his primary care physician a few weeks ago.  2. Vitamin D deficiency Manpreet is on Vit D weekly, and he denies nausea, vomiting, or muscle weakness. I discussed labs with the patient today.  Assessment/Plan:   1. Type 2 diabetes mellitus without complication, with long-term current use of insulin (HCC) We will refill Ozempic 2 mg for 1 month, and he will continue to follow up as directed. Good blood sugar control is important to decrease the likelihood of diabetic complications such as nephropathy, neuropathy, limb loss, blindness, coronary artery disease, and death. Intensive lifestyle modification including diet, exercise and weight loss are the first line of treatment for diabetes.   - Semaglutide, 2 MG/DOSE, (OZEMPIC, 2 MG/DOSE,) 8 MG/3ML SOPN; Inject 2 mg into the skin once a week.  Dispense: 9 mL; Refill: 0  2. Vitamin D deficiency Low Vitamin D level contributes to fatigue and  are associated with obesity, breast, and colon cancer. We will refill prescription Vitamin D 50,000 IU every week for 90 days, with no refills and he will add Vit D 5,000 IU daily. [place] will follow-up for routine testing of Vitamin D, at least 2-3 times per year to avoid over-replacement.  - Vitamin D, Ergocalciferol, (DRISDOL) 1.25 MG (50000 UNIT) CAPS capsule; Take 1 capsule (50,000 Units total) by mouth every 7 (seven) days.  Dispense: 12 capsule; Refill: 0 - Cholecalciferol (VITAMIN D3) 125 MCG (5000 UT) CAPS; Take 1 capsule (5,000 Units total) by mouth daily.  Dispense: 30 capsule; Refill: 0  3. Class 1 obesity with serious comorbidity and body mass index (BMI) of 34.0 to 34.9 in adult, unspecified obesity type Linford is currently in the action stage of change. As such, his goal is to continue with weight loss efforts. He has agreed to the Category 4 Plan.   Exercise goals: As is.  Behavioral modification strategies: decreasing simple carbohydrates and meal planning and cooking strategies.  Brach has agreed to follow-up with our clinic in 4 weeks. He was informed of the importance of frequent follow-up visits to maximize his success with intensive lifestyle modifications for his multiple health conditions.   Objective:   Blood pressure 121/66, pulse (!) 102, temperature 98.1 F (36.7 C), height 5\' 10"  (1.778 m), weight 237 lb (107.5 kg), SpO2 99 %. Body mass index is 34.01 kg/m.  General: Cooperative, alert, well developed, in no acute distress. HEENT: Conjunctivae and lids unremarkable. Cardiovascular: Regular rhythm.  Lungs: Normal work of breathing. Neurologic: No focal deficits.   Lab Results  Component Value Date   CREATININE 0.96 04/21/2021   BUN 23 04/21/2021   NA 138 04/21/2021   K 4.1 04/21/2021   CL 100 04/21/2021   CO2 24 04/21/2021   Lab Results  Component Value Date   ALT 19 04/21/2021   AST 12 04/21/2021   ALKPHOS 37 (L) 04/21/2021   BILITOT 0.4  04/21/2021   Lab Results  Component Value Date   HGBA1C 5.9 (H) 04/21/2021   HGBA1C 5.3 12/02/2020   HGBA1C 5.9 (H) 08/29/2020   HGBA1C 5.7 (H) 05/09/2020   HGBA1C 5.8 (H) 12/12/2019   Lab Results  Component Value Date   INSULIN 5.8 08/29/2020   INSULIN 5.3 05/09/2020   Lab Results  Component Value Date   TSH 1.25 02/05/2020   Lab Results  Component Value Date   CHOL 134 04/21/2021   HDL 46 04/21/2021   LDLCALC 69 04/21/2021   LDLDIRECT 112.0 07/31/2016   TRIG 101 04/21/2021   CHOLHDL 2.9 04/21/2021   Lab Results  Component Value Date   VD25OH 42.0 04/21/2021   VD25OH 59.0 01/28/2021   VD25OH 54.6 08/29/2020   Lab Results  Component Value Date   WBC 5.8 05/22/2021   HGB 12.4 (L) 05/22/2021   HCT 37.1 (L) 05/22/2021   MCV 85.0 05/22/2021   PLT 284.0 05/22/2021   No results found for: IRON, TIBC, FERRITIN  Obesity Behavioral Intervention:   Approximately 15 minutes were spent on the discussion below.  ASK: We discussed the diagnosis of obesity with [name] today and Lux agreed to give [name] permission to discuss obesity behavioral modification therapy today.  ASSESS: Jessie has the diagnosis of obesity and his BMI today is 34.01. Baruc is in the action stage of change.   ADVISE: Bently was educated on the multiple health risks of obesity as well as the benefit of weight loss to improve his health. He was advised of the need for long term treatment and the importance of lifestyle modifications to improve his current health and to decrease his risk of future health problems.  AGREE: Multiple dietary modification options and treatment options were discussed and Jevaughn agreed to follow the recommendations documented in the above note.  ARRANGE: Irl was educated on the importance of frequent visits to treat obesity as outlined per CMS and USPSTF guidelines and agreed to schedule his next follow up appointment today.  Attestation Statements:   Reviewed by  clinician on day of visit: allergies, medications, problem list, medical history, surgical history, family history, social history, and previous encounter notes.   Wilhemena Durie, am acting as transcriptionist for [employer], PA-C.  I have reviewed the above documentation for accuracy and completeness, and I agree with the above. Abby Potash, PA-C

## 2021-06-03 DIAGNOSIS — Z20822 Contact with and (suspected) exposure to covid-19: Secondary | ICD-10-CM | POA: Diagnosis not present

## 2021-06-04 ENCOUNTER — Ambulatory Visit (HOSPITAL_COMMUNITY): Payer: Medicare Other | Admitting: Physical Therapy

## 2021-06-04 DIAGNOSIS — Z20822 Contact with and (suspected) exposure to covid-19: Secondary | ICD-10-CM | POA: Diagnosis not present

## 2021-06-09 ENCOUNTER — Ambulatory Visit (HOSPITAL_COMMUNITY): Payer: Medicare Other | Attending: Neurosurgery | Admitting: Physical Therapy

## 2021-06-09 ENCOUNTER — Other Ambulatory Visit: Payer: Self-pay

## 2021-06-09 DIAGNOSIS — M5441 Lumbago with sciatica, right side: Secondary | ICD-10-CM | POA: Insufficient documentation

## 2021-06-09 DIAGNOSIS — M6281 Muscle weakness (generalized): Secondary | ICD-10-CM | POA: Diagnosis not present

## 2021-06-09 DIAGNOSIS — M545 Low back pain, unspecified: Secondary | ICD-10-CM

## 2021-06-09 DIAGNOSIS — M5442 Lumbago with sciatica, left side: Secondary | ICD-10-CM | POA: Insufficient documentation

## 2021-06-09 DIAGNOSIS — G8929 Other chronic pain: Secondary | ICD-10-CM | POA: Diagnosis not present

## 2021-06-09 NOTE — Therapy (Signed)
[facility] [address] [place], [place], [address] Phone: [phone]   Fax:  [phone]  Physical Therapy Treatment  Patient Details  Name: [name] [name] MRN: [id number] Date of Birth: 1951/06/16 Referring Provider (PT): Erline Levine MD  Progress Note Reporting Period 05/21/21 to 06/09/21  See note below for Objective Data and Assessment of Progress/Goals.      Encounter Date: 06/09/2021   PT End of Session - 06/09/21 1229     Visit Number 20    Number of Visits 24    Date for PT Re-Evaluation 07/07/21    Authorization Type Medicare A/ BCBS supplement 2nd (no auth)    Progress Note Due on Visit 24    PT Start Time 1120    PT Stop Time 1200    PT Time Calculation (min) 40 min    Activity Tolerance Patient tolerated treatment well    Behavior During Therapy WFL for tasks assessed/performed             Past Medical History:  Diagnosis Date   Allergic rhinitis, unspecified    Allergy testing via allergist 04/2017--mostly neg.  Dr. Donneta Romberg recommended referral to ENT so i did this.   Back pain    LBP->DDD/spondylosis w/ intermittent radiulopathy-type leg pains, + bilat hip and thigh pain-->spinal stenosis L4-5, L5-S1->Dr. Vertell Limber to do decomp and fusion surgery, scheduled for 12/03/20   BPH (benign prostatic hypertrophy)    no hydro on renal u/s 05/2020   Chronic nasal congestion    DDD (degenerative disc disease), lumbar 2010   laminectomy 01/2009.  MR rpt 05/2019->advanced DDD/spondylosis L3-S1, with mod/sev L4-5 spinal stenosis-->plan for surg per Dr. Vertell Limber as of 09/2020   Depression    Hospitalized for suicidal ideation 1992.  Has been on lexapro since 10/2008.   Diabetes mellitus type II    Dx'd approximately 06/2008.  No retinopathy as of 02/2017 ophth.   Erectile dysfunction    Heart murmur, systolic 14/9702   mild intensity, asymptomatic; echo 01/2018 valves fine->obs   History of colon polyps 01/2016   Recall 3 yrs    History of pneumonia 2008   Hospitalized   History of scarlet fever    age 70   Hyperlipidemia, mixed 1993   myalgias on pravastatin   Hypertension 2009   "marked chronic cardiomegaly" on CXR 01/2009 per old records.   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 Delice Lesch   MRI brain 03/01/20 NORMAL   OSA on CPAP 2004; 2019   Back on CPAP 04/2018.  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  02/21/16: tubular adenoma.  Recall 3 yrs (Dr. Ardis Hughs).   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 Vertell Limber)   TONSILLECTOMY AND ADENOIDECTOMY     age 78   TRANSTHORACIC ECHOCARDIOGRAM  02/22/2018   EF 55-60%, grd I DD   URETHRAL DILATION     2 as a child, one as an adult   [place]   VITRECTOMY      There were no vitals filed for this visit.   Subjective Assessment - 06/09/21 1130     Subjective Patient sates he is feeling better. Simplifying HEP has benefited him. He still has trouble walking longer periods but feels this has improved to 15 minutes. He still feels 80% improved overall. He had recent referral for aquatic therapy, so he would like to continue working on strengthening his back muscles with the addition of aquatics to develop HEP for pool.    Pertinent History Lumbar fusion 12/03/20    Limitations  Lifting;Standing;Walking;House hold activities    How long can you stand comfortably? 15 minutes    Patient Stated Goals Get rid of pain, walk the dogs more, walk longer, improve balance    Currently in Pain? Yes    Pain Score 2     Pain Location Back    Pain Orientation Lower    Pain Descriptors / Indicators Aching    Pain Onset More than a month ago    Pain Frequency Intermittent    Aggravating Factors  walking, bending, lifting    Pain Relieving Factors stretching, heat, self traction    Effect of Pain on Daily Activities Limits                [facility] PT Assessment - 06/09/21 0001       Assessment   Medical Diagnosis LBP    Referring Provider (PT) Erline Levine MD    Onset Date/Surgical Date 12/03/20      [place] residence      Prior Function   Level of Independence Independent      Cognition   Overall Cognitive Status Within Functional Limits for tasks assessed      Observation/Other Assessments   Focus on Therapeutic Outcomes (FOTO)  51% function      AROM   Lumbar Flexion 10% limited   was 25%   Lumbar Extension 30% limited   was 40%                          [facility] Adult PT Treatment/Exercise - 06/09/21 0001       Lumbar Exercises: Standing   Other Standing Lumbar Exercises lifting mechanics 5lb box x 5      Lumbar Exercises: Supine   Bent Knee Raise 20 reps    Dead Bug 20 reps    Bridge 20 reps      Lumbar Exercises: Quadruped   Opposite Arm/Leg Raise 20 reps                      PT Short Term Goals - 04/10/21 0931       PT SHORT TERM GOAL #1   Title Patient will be independent with initial HEP and self-management strategies to improve functional outcomes    Time 2    Period Weeks    Status Achieved    Target Date 03/24/21               PT Long Term Goals - 06/09/21 1127  PT LONG TERM GOAL #1   Title Patient will have equal to or > 4+/5 MMT throughout BLE to  improve ability to perform functional mobility, stair ambulation and ADLs.    Baseline See MMT    Time 4    Period Weeks    Status Achieved      PT LONG TERM GOAL #2   Title Patient will report at least 75% overall improvement in subjective complaint to indicate improvement in ability to perform ADLs.    Baseline Current reports 80%    Time 4    Period Weeks    Status Achieved      PT LONG TERM GOAL #3   Title Patient will be able to maintain tandem stance >30 seconds on foam bilaterally to show improved stability and ability to ambulate outdoors    Baseline 04/01/21 - tandem on foam left foot forward 12 seconds; right foot forward 27 seconds. 5/31 acheived (30 sec bilateral but min sway with RLE forward)    Time 4    Period Weeks    Status Achieved      PT LONG TERM GOAL #4   Title Patient will be able to walk >30 minutes with no increased back pain for walking dog and exercising outdoors.    Baseline Currently 15 minutes at most    Time 4    Period Weeks    Status On-going                   Plan - 06/09/21 1233     Clinical Impression Statement Patient showing slow but steady progress. Improving mechanics and lumbar mobility. Still limited by pain with prolonged functional activity, though also improving. Patient given recent referral to begin aquatic therapy. Patient would continue to benefit from skilled therapy services to incorporate aquatic therapy exercise and develop aquatic HEP, and to progress core strengthening for reduced pain and improved functional ability.    Personal Factors and Comorbidities Comorbidity 2    Comorbidities arthritis, DM    Examination-Activity Limitations Lift;Stand;Locomotion Level;Transfers;Carry;Stairs;Bend;Squat    Examination-Participation Restrictions Community Activity;Yard Work    Stability/Clinical Decision Making Stable/Uncomplicated    Rehab Potential Good    PT Frequency 1x / week    PT Duration 4 weeks    PT  Treatment/Interventions ADLs/Self Care Home Management;Aquatic Therapy;Canalith Repostioning;Cryotherapy;Electrical Stimulation;Contrast Bath;Therapeutic exercise;Patient/family education;Orthotic Fit/Training;Manual lymph drainage;Compression bandaging;Taping;Vasopneumatic Device;Joint Manipulations;Splinting;Energy conservation;Spinal Manipulations;Visual/perceptual remediation/compensation;Passive range of motion;Dry needling;Manual techniques;Scar mobilization;Vestibular;Balance training;Neuromuscular re-education;DME Instruction;Gait training;Moist Heat;Traction;Stair training;Functional mobility training;Therapeutic activities;Ultrasound;Parrafin;Fluidtherapy;Biofeedback;Iontophoresis 4mg /ml Dexamethasone    PT Next Visit Plan Continue on with focus on fundamental core stabilization exercise for home program . Begin aquatics and develop HEP    PT Home Exercise Plan Eval: ab set, hip abd/ add iso, bridge, RTB for clams 4/26: pelvic tilt, glute set; 04/03/21 - tandem stance on solid/compliant surfaces with/without head/arm movements.; 5/17: QL stretch 5/19 ab march, sit to stand; 05/06/21: self traction, sitting on theraball, heel slides on theraball.6/22 Supine Transversus Abdominis Bracing - Hands on Stomach - 2 x daily - 7 x weekly - 1 sets - 10 reps - 5 second hold  Supine March - 2 x daily - 7 x weekly - 2 sets - 10 reps  Supine Straight Leg Raises - 2 x daily - 7 x weekly - 2 sets - 10 reps  Supine Posterior Pelvic Tilt - 2 x daily - 7 x weekly - 1 sets - 10 reps - 5 second hold  Supine Double Knee to Chest -  2 x daily - 7 x weekly - 1 sets - 10 reps - 5 second hold  Sit to Stand Without Arm Support    Consulted and Agree with Plan of Care Patient             Patient will benefit from skilled therapeutic intervention in order to improve the following deficits and impairments:  Pain, Improper body mechanics, Increased fascial restricitons, Abnormal gait, Decreased balance, Impaired flexibility,  Decreased activity tolerance, Decreased range of motion, Decreased strength, Decreased mobility, Postural dysfunction  Visit Diagnosis: Chronic midline low back pain with bilateral sciatica - Plan: PT plan of care cert/re-cert  Low back pain, unspecified back pain laterality, unspecified chronicity, unspecified whether sciatica present - Plan: PT plan of care cert/re-cert  Muscle weakness (generalized) - Plan: PT plan of care cert/re-cert     Problem List Patient Active Problem List   Diagnosis Date Noted   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   Lumbago with sciatica, right side 09/02/2020   Chronic bilateral low back pain with bilateral sciatica 09/02/2020   Vitamin D deficiency 04/16/2020   Depression 04/16/2020   Class 1 obesity with serious comorbidity and body mass index (BMI) of 33.0 to 33.9 in adult 04/16/2020   Other fatigue 02/14/2018   Shortness of breath on exertion 02/14/2018   Type 2 diabetes mellitus with hyperglycemia, with long-term current use of insulin (HCC) 02/14/2018   Abnormal EKG 02/14/2018   Maxillary sinusitis, acute 12/17/2015   Cough 12/17/2015   Fever 12/17/2015   Diabetes mellitus without complication ([place]) 80/16/5537   Tooth pain 06/21/2013   Orthostatic lightheadedness 05/17/2013   Health maintenance examination 05/17/2013   Groin strain 11/10/2012   Hypogonadism, male 01/11/2012   Type II or unspecified type diabetes mellitus without mention of complication, not stated as uncontrolled 07/08/2011   HTN (hypertension), benign 07/08/2011   Hyperlipidemia 07/08/2011   BPH (benign prostatic hypertrophy) 07/08/2011   Hx of adenomatous colonic polyps 07/08/2011   12:38 PM, 06/09/21 Josue Hector PT DPT  Physical Therapist with [place]  [facility]  [phone] [place], [place], [address] Phone: [phone]   Fax:  [phone]  Name: [name] [name] MRN: [id number] Date of Birth: Apr 23, 1951

## 2021-06-10 ENCOUNTER — Encounter (INDEPENDENT_AMBULATORY_CARE_PROVIDER_SITE_OTHER): Payer: Self-pay

## 2021-06-10 ENCOUNTER — Other Ambulatory Visit (INDEPENDENT_AMBULATORY_CARE_PROVIDER_SITE_OTHER): Payer: Self-pay | Admitting: Physician Assistant

## 2021-06-10 DIAGNOSIS — F3289 Other specified depressive episodes: Secondary | ICD-10-CM

## 2021-06-10 NOTE — Telephone Encounter (Signed)
Message sent to pt-CAS 

## 2021-06-11 ENCOUNTER — Other Ambulatory Visit: Payer: Self-pay

## 2021-06-11 ENCOUNTER — Ambulatory Visit (HOSPITAL_BASED_OUTPATIENT_CLINIC_OR_DEPARTMENT_OTHER)
Admission: RE | Admit: 2021-06-11 | Discharge: 2021-06-11 | Disposition: A | Discharge status: Home / Self Care | Payer: Medicare Other | Source: Ambulatory Visit | Attending: Family Medicine | Admitting: Family Medicine

## 2021-06-11 DIAGNOSIS — R011 Cardiac murmur, unspecified: Secondary | ICD-10-CM | POA: Diagnosis not present

## 2021-06-11 LAB — ECHOCARDIOGRAM COMPLETE
AR max vel: 3.39 cm2
AV Area VTI: 3.1 cm2
AV Area mean vel: 3.08 cm2
AV Mean grad: 11.5 mmHg
AV Peak grad: 21.9 mmHg
Ao pk vel: 2.34 m/s
Area-P 1/2: 2.46 cm2
Calc EF: 59.1 %
S' Lateral: 2.16 cm
Single Plane A2C EF: 49.8 %
Single Plane A4C EF: 68.3 %

## 2021-06-11 NOTE — Progress Notes (Signed)
*  PRELIMINARY RESULTS* Echocardiogram 2D Echocardiogram has been performed.  Luisa Hart RDCS 06/11/2021, 9:54 AM

## 2021-06-12 ENCOUNTER — Encounter: Payer: Self-pay | Admitting: Family Medicine

## 2021-06-16 ENCOUNTER — Encounter (HOSPITAL_COMMUNITY): Payer: Self-pay | Admitting: Physical Therapy

## 2021-06-16 ENCOUNTER — Ambulatory Visit (HOSPITAL_COMMUNITY): Payer: Medicare Other | Admitting: Physical Therapy

## 2021-06-16 DIAGNOSIS — G8929 Other chronic pain: Secondary | ICD-10-CM | POA: Diagnosis not present

## 2021-06-16 DIAGNOSIS — M5441 Lumbago with sciatica, right side: Secondary | ICD-10-CM | POA: Diagnosis not present

## 2021-06-16 DIAGNOSIS — M545 Low back pain, unspecified: Secondary | ICD-10-CM | POA: Diagnosis not present

## 2021-06-16 DIAGNOSIS — M5442 Lumbago with sciatica, left side: Secondary | ICD-10-CM | POA: Diagnosis not present

## 2021-06-16 DIAGNOSIS — M6281 Muscle weakness (generalized): Secondary | ICD-10-CM | POA: Diagnosis not present

## 2021-06-16 NOTE — Therapy (Signed)
[facility] [place], [place], [address] Phone: [phone]   Fax:  [phone]  Physical Therapy Treatment  Patient Details  Name: [name] [name] MRN: [id number] Date of Birth: 1951-05-03 Referring Provider (PT): Erline Levine MD   Encounter Date: 06/16/2021   PT End of Session - 06/16/21 1710     Visit Number 21    Number of Visits 24    Date for PT Re-Evaluation 07/07/21    Authorization Type Medicare A/ BCBS supplement 2nd (no auth)    Progress Note Due on Visit 24    PT Start Time 1403    PT Stop Time 1450    PT Time Calculation (min) 47 min    Activity Tolerance Patient tolerated treatment well    Behavior During Therapy [facility] for tasks assessed/performed             Past Medical History:  Diagnosis Date   Allergic rhinitis, unspecified    Allergy testing via allergist 04/2017--mostly neg.  Dr. Donneta Romberg recommended referral to ENT so i did this.   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   Back pain    LBP->DDD/spondylosis w/ intermittent radiulopathy-type leg pains, + bilat hip and thigh pain-->spinal stenosis L4-5, L5-S1->Dr. Vertell Limber to do decomp and fusion surgery, scheduled for 12/03/20   BPH (benign prostatic hypertrophy)    no hydro on renal u/s 05/2020   Chronic nasal congestion    DDD (degenerative disc disease), lumbar 2010   laminectomy 01/2009.  MR rpt 05/2019->advanced DDD/spondylosis L3-S1, with mod/sev L4-5 spinal stenosis-->plan for surg per Dr. Vertell Limber as of 09/2020   Depression    Hospitalized for suicidal ideation 1992.  Has been on lexapro since 10/2008.   Diabetes mellitus type II    Dx'd approximately 06/2008.  No retinopathy as of 02/2017 ophth.   Erectile dysfunction    Heart murmur, systolic 60/1093   mild intensity, asymptomatic; echo 01/2018 valves fine->obs   History of colon polyps 01/2016   Recall 3 yrs   History of pneumonia 2008   Hospitalized    History of scarlet fever    age 3   Hyperlipidemia, mixed 1993   myalgias on pravastatin   Hypertension 2009   "marked chronic cardiomegaly" on CXR 01/2009 per old records.   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 Delice Lesch   MRI brain 03/01/20 NORMAL   OSA on CPAP 2004; 2019   Back on CPAP 04/2018.  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 and surgery for this (?) around [place]    Past Surgical History:  Procedure Laterality Date   [place]  X 4   Most recent was 2007 [place], [place]), with removal of polyps in the first two.  02/21/16: tubular adenoma.  Recall 3 yrs (Dr. Ardis Hughs).   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 Vertell Limber)   [place]     age 16   TRANSTHORACIC ECHOCARDIOGRAM  02/22/2018   EF 55-60%, grd I DD. Aortic root dilation 41 mm.  05/2021 echo unchanged except aortic root dilation down to 39 mm. Plan rpt 1 yr to monitor aortic root dilation.   URETHRAL DILATION     2 as a child, one as an adult   VASECTOMY  1994   VITRECTOMY      There were no vitals filed for this visit.   Subjective Assessment - 06/16/21 1706     Subjective Patient states back is sore today. He had a rough weekend lifting items from auction.    Pertinent History Lumbar fusion 12/03/20    Limitations Lifting;Standing;Walking;House hold activities    How long can you stand comfortably? 15 minutes    Patient Stated Goals Get rid of pain, walk the dogs more, walk longer, improve balance     Currently in Pain? Yes    Pain Score 3     Pain Location Back    Pain Orientation Lower    Pain Descriptors / Indicators Aching;Sore    Pain Type Surgical pain    Pain Onset More than a month ago                           Adult Aquatic Therapy - 06/16/21 1721       Treatment   Gait dynamic warmup pool walking, sidestepping 3RT    Exercises heel raise 3x10, hip abduction/ extension 3 x 10 each with cues for tA activation, mini squats 3 x 10, single arm dumbbell pull down 2 x 10, double arm dumbbell pull down 2x10, standing hamstring stretch at steps 3 x 30"                          PT Short Term Goals - 04/10/21 0931       PT SHORT TERM GOAL #1   Title Patient will be independent with initial HEP and self-management strategies to improve functional outcomes    Time 2    Period Weeks    Status Achieved    Target Date 03/24/21               PT Long Term Goals - 06/09/21 1127       PT LONG TERM GOAL #1   Title Patient will have equal to or > 4+/5 MMT throughout BLE to improve ability to perform functional mobility, stair ambulation and ADLs.    Baseline See MMT    Time 4    Period Weeks    Status Achieved      PT LONG TERM GOAL #2   Title Patient will report at least 75% overall improvement in subjective complaint to indicate improvement in ability to perform ADLs.    Baseline Current reports 80%    Time 4    Period Weeks    Status Achieved      PT LONG TERM GOAL #3   Title Patient will be able to maintain tandem stance >30 seconds on foam bilaterally to show improved stability and ability to ambulate outdoors    Baseline 04/01/21 - tandem on foam left foot forward 12 seconds; right foot forward 27 seconds. 5/31 acheived (30 sec bilateral but min sway with RLE forward)    Time 4    Period Weeks    Status  Achieved      PT LONG TERM GOAL #4   Title Patient will be able to walk >30 minutes with no increased back pain for walking dog  and exercising outdoors.    Baseline Currently 15 minutes at most    Time 4    Period Weeks    Status On-going                   Plan - 06/16/21 1710     Clinical Impression Statement Initiated aquatic therapy. Patient tolerated session well. Educated on benefit and application of aquatic therapy and exercise. Focused on LE and core strengthening activity on gravity minimized setting. Patient tolerated well with no increased complaint of pain. Patient cued on proper form and mechanics. Patient will continue to benefit from skilled therapy service to reduce deficits and improve functional ability.    Personal Factors and Comorbidities Comorbidity 2    Comorbidities arthritis, DM    Examination-Activity Limitations Lift;Stand;Locomotion Level;Transfers;Carry;Stairs;Bend;Squat    Examination-Participation Restrictions Community Activity;Yard Work    Stability/Clinical Decision Making Stable/Uncomplicated    Rehab Potential Good    PT Frequency 1x / week    PT Duration 4 weeks    PT Treatment/Interventions ADLs/Self Care Home Management;Aquatic Therapy;Canalith Repostioning;Cryotherapy;Electrical Stimulation;Contrast Bath;Therapeutic exercise;Patient/family education;Orthotic Fit/Training;Manual lymph drainage;Compression bandaging;Taping;Vasopneumatic Device;Joint Manipulations;Splinting;Energy conservation;Spinal Manipulations;Visual/perceptual remediation/compensation;Passive range of motion;Dry needling;Manual techniques;Scar mobilization;Vestibular;Balance training;Neuromuscular re-education;DME Instruction;Gait training;Moist Heat;Traction;Stair training;Functional mobility training;Therapeutic activities;Ultrasound;Parrafin;Fluidtherapy;Biofeedback;Iontophoresis 4mg /ml Dexamethasone    PT Next Visit Plan Continue on with focus on fundamental core stabilization exercise for home program .    PT Home Exercise Plan Eval: ab set, hip abd/ add iso, bridge, RTB for clams 4/26: pelvic tilt,  glute set; 04/03/21 - tandem stance on solid/compliant surfaces with/without head/arm movements.; 5/17: QL stretch 5/19 ab march, sit to stand; 05/06/21: self traction, sitting on theraball, heel slides on theraball.6/22 Supine Transversus Abdominis Bracing - Hands on Stomach - 2 x daily - 7 x weekly - 1 sets - 10 reps - 5 second hold  Supine March - 2 x daily - 7 x weekly - 2 sets - 10 reps  Supine Straight Leg Raises - 2 x daily - 7 x weekly - 2 sets - 10 reps  Supine Posterior Pelvic Tilt - 2 x daily - 7 x weekly - 1 sets - 10 reps - 5 second hold  Supine Double Knee to Chest - 2 x daily - 7 x weekly - 1 sets - 10 reps - 5 second hold  Sit to Stand Without Arm Support    Consulted and Agree with Plan of Care Patient             Patient will benefit from skilled therapeutic intervention in order to improve the following deficits and impairments:  Pain, Improper body mechanics, Increased fascial restricitons, Abnormal gait, Decreased balance, Impaired flexibility, Decreased activity tolerance, Decreased range of motion, Decreased strength, Decreased mobility, Postural dysfunction  Visit Diagnosis: Low back pain, unspecified back pain laterality, unspecified chronicity, unspecified whether sciatica present  Muscle weakness (generalized)     Problem List Patient Active Problem List   Diagnosis Date Noted   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   Lumbago with sciatica, right side 09/02/2020   Chronic bilateral low back pain with bilateral sciatica 09/02/2020   Vitamin D deficiency 04/16/2020   Depression 04/16/2020   Class 1 obesity with serious comorbidity and body mass index (BMI) of 33.0 to 33.9 in adult 04/16/2020   Other fatigue 02/14/2018   Shortness  of breath on exertion 02/14/2018   Type 2 diabetes mellitus with hyperglycemia, with long-term current use of insulin ([place]) 02/14/2018   Abnormal EKG 02/14/2018   Maxillary sinusitis, acute 12/17/2015   Cough 12/17/2015   Fever  12/17/2015   Diabetes mellitus without complication ([place]) 01/10/1551   Tooth pain 06/21/2013   Orthostatic lightheadedness 05/17/2013   Health maintenance examination 05/17/2013   Groin strain 11/10/2012   Hypogonadism, male 01/11/2012   Type II or unspecified type diabetes mellitus without mention of complication, not stated as uncontrolled 07/08/2011   HTN (hypertension), benign 07/08/2011   Hyperlipidemia 07/08/2011   BPH (benign prostatic hypertrophy) 07/08/2011   Hx of adenomatous colonic polyps 07/08/2011   5:25 PM, 06/16/21 Josue Hector PT DPT  Physical Therapist with [facility]  [phone] [place] [address] [place], [place], [address] Phone: [phone]   Fax:  [phone]  Name: RICHARDSON DUBREE MRN: [id number] Date of Birth: March 01, 1951

## 2021-06-17 ENCOUNTER — Other Ambulatory Visit: Payer: Self-pay

## 2021-06-23 ENCOUNTER — Ambulatory Visit (HOSPITAL_COMMUNITY): Payer: Medicare Other | Admitting: Physical Therapy

## 2021-06-23 ENCOUNTER — Other Ambulatory Visit: Payer: Self-pay

## 2021-06-23 ENCOUNTER — Encounter (HOSPITAL_COMMUNITY): Payer: Self-pay | Admitting: Physical Therapy

## 2021-06-23 DIAGNOSIS — M5441 Lumbago with sciatica, right side: Secondary | ICD-10-CM | POA: Diagnosis not present

## 2021-06-23 DIAGNOSIS — M6281 Muscle weakness (generalized): Secondary | ICD-10-CM

## 2021-06-23 DIAGNOSIS — M5442 Lumbago with sciatica, left side: Secondary | ICD-10-CM | POA: Diagnosis not present

## 2021-06-23 DIAGNOSIS — G8929 Other chronic pain: Secondary | ICD-10-CM | POA: Diagnosis not present

## 2021-06-23 DIAGNOSIS — M545 Low back pain, unspecified: Secondary | ICD-10-CM | POA: Diagnosis not present

## 2021-06-23 NOTE — Therapy (Signed)
[facility] [address] [place], [place], [address] Phone: [phone]   Fax:  [phone]  Physical Therapy Treatment  Patient Details  Name: [name] [name] MRN: [id number] Date of Birth: June 06, 1951 Referring Provider (PT): Erline Levine MD   Encounter Date: 06/23/2021   PT End of Session - 06/23/21 1701     Visit Number 22    Number of Visits 24    Date for PT Re-Evaluation 07/07/21    Authorization Type Medicare A/ BCBS supplement 2nd (no auth)    Progress Note Due on Visit 24    PT Start Time 1401    PT Stop Time 1450    PT Time Calculation (min) 49 min    Activity Tolerance Patient tolerated treatment well    Behavior During Therapy [facility] for tasks assessed/performed             Past Medical History:  Diagnosis Date   Allergic rhinitis, unspecified    Allergy testing via allergist 04/2017--mostly neg.  Dr. Donneta Romberg recommended referral to ENT so i did this.   Aortic root dilatation ([place]) 2019   2019, 41 mm, 2022, 39 mm-->rpt 1 yr.   Back pain    LBP->DDD/spondylosis w/ intermittent radiulopathy-type leg pains, + bilat hip and thigh pain-->spinal stenosis L4-5, L5-S1->Dr. Vertell Limber to do decomp and fusion surgery, scheduled for 12/03/20   BPH (benign prostatic hypertrophy)    no hydro on renal u/s 05/2020   Chronic nasal congestion    DDD (degenerative disc disease), lumbar 2010   laminectomy 01/2009.  MR rpt 05/2019->advanced DDD/spondylosis L3-S1, with mod/sev L4-5 spinal stenosis-->plan for surg per Dr. Vertell Limber as of 09/2020   Depression    Hospitalized for suicidal ideation 1992.  Has been on lexapro since 10/2008.   Diabetes mellitus type II    Dx'd approximately 06/2008.  No retinopathy as of 02/2017 ophth.   Erectile dysfunction    Heart murmur, systolic [id number]   mild intensity, asymptomatic; echo 01/2018 valves fine->obs   History of colon polyps 01/2016   Recall 3 yrs   History of pneumonia 2008   Hospitalized    History of scarlet fever    age 70   Hyperlipidemia, mixed 1993   myalgias on pravastatin   Hypertension 2009   "marked chronic cardiomegaly" on CXR 01/2009 per old records.   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 Delice Lesch   MRI brain 03/01/20 NORMAL   OSA on CPAP 2004; 2019   Back on CPAP 04/2018.  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  02/21/16: tubular adenoma.  Recall 3 yrs (Dr. Ardis Hughs).   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 Vertell Limber)   [place]     age 68   TRANSTHORACIC ECHOCARDIOGRAM  02/22/2018   EF 55-60%, grd I DD. Aortic root dilation 41 mm.  05/2021 echo unchanged except aortic root dilation down to 39 mm. Plan rpt 1 yr to monitor aortic root dilation.   URETHRAL DILATION     2 as a child, one as an adult   VASECTOMY  1994   VITRECTOMY      There were no vitals filed for this visit.   Subjective Assessment - 06/23/21 1659     Subjective Patient states he was "sore all over" after last aquatic therapy session, but felt good and soreness only lasted about a day.    Pertinent History Lumbar fusion 12/03/20    Limitations Lifting;Standing;Walking;House hold activities    How long can you stand comfortably? 15 minutes    Patient Stated Goals Get rid of pain, walk the dogs more,  walk longer, improve balance    Currently in Pain? Yes    Pain Score 2     Pain Location Back    Pain Orientation Lower    Pain Descriptors / Indicators Aching;Sore    Pain Onset More than a month ago    Pain Frequency Intermittent                           Adult Aquatic Therapy - 06/23/21 1707       Treatment   Gait dynamic warmup pool walking, sidestepping 3RT    Exercises heel raise 3x10, hip abduction/ extension 3 x 10 each with cues for TA activation, mini squats 3 x 10, single arm dumbbell pull down 2 x 10, double arm dumbbell pull down 2x10, dumbbell twists 2 x 10                          PT Short Term Goals - 04/10/21 0931       PT SHORT TERM GOAL #1   Title Patient will be independent with initial HEP and self-management strategies to improve functional outcomes    Time 2    Period Weeks    Status Achieved    Target Date 03/24/21               PT Long Term Goals - 06/09/21 1127       PT LONG TERM GOAL #1   Title Patient will have equal to or > 4+/5 MMT throughout BLE to improve ability to perform functional mobility, stair ambulation and ADLs.    Baseline See MMT    Time 4    Period Weeks    Status Achieved      PT LONG TERM GOAL #2   Title Patient will report at least 75% overall improvement in subjective complaint to indicate improvement in ability to perform ADLs.    Baseline Current reports 80%    Time 4    Period Weeks    Status Achieved      PT LONG TERM GOAL #3   Title Patient will be able to maintain tandem stance >30 seconds on foam bilaterally to show improved stability and ability to ambulate outdoors    Baseline 04/01/21 - tandem on foam left foot forward 12 seconds; right foot forward 27 seconds. 5/31 acheived (30 sec bilateral but min sway with RLE forward)    Time 4    Period Weeks  Status Achieved      PT LONG TERM GOAL #4   Title Patient will be able to walk >30 minutes with no increased back pain  for walking dog and exercising outdoors.    Baseline Currently 15 minutes at most    Time 4    Period Weeks    Status On-going                   Plan - 06/23/21 1702     Clinical Impression Statement Patient tolerated session well today. Added lumbar traction with pool float. Patient cued on knee position during squats. Patient reports no pain at end of session. Patient will continue to benefit from strength progressions for reduced pain and improved functional ability.    Personal Factors and Comorbidities Comorbidity 2    Comorbidities arthritis, DM    Examination-Activity Limitations Lift;Stand;Locomotion Level;Transfers;Carry;Stairs;Bend;Squat    Examination-Participation Restrictions Community Activity;Yard Work    Stability/Clinical Decision Making Stable/Uncomplicated    Rehab Potential Good    PT Frequency 1x / week    PT Duration 4 weeks    PT Treatment/Interventions ADLs/Self Care Home Management;Aquatic Therapy;Canalith Repostioning;Cryotherapy;Electrical Stimulation;Contrast Bath;Therapeutic exercise;Patient/family education;Orthotic Fit/Training;Manual lymph drainage;Compression bandaging;Taping;Vasopneumatic Device;Joint Manipulations;Splinting;Energy conservation;Spinal Manipulations;Visual/perceptual remediation/compensation;Passive range of motion;Dry needling;Manual techniques;Scar mobilization;Vestibular;Balance training;Neuromuscular re-education;DME Instruction;Gait training;Moist Heat;Traction;Stair training;Functional mobility training;Therapeutic activities;Ultrasound;Parrafin;Fluidtherapy;Biofeedback;Iontophoresis '4mg'$ /ml Dexamethasone    PT Next Visit Plan Continue on with focus on fundamental core stabilization exercise for home program .    PT Home Exercise Plan Eval: ab set, hip abd/ add iso, bridge, RTB for clams 4/26: pelvic tilt, glute set; 04/03/21 - tandem stance on solid/compliant surfaces with/without head/arm movements.; 5/17: QL stretch 5/19 ab march,  sit to stand; 05/06/21: self traction, sitting on theraball, heel slides on theraball.6/22 Supine Transversus Abdominis Bracing - Hands on Stomach - 2 x daily - 7 x weekly - 1 sets - 10 reps - 5 second hold  Supine March - 2 x daily - 7 x weekly - 2 sets - 10 reps  Supine Straight Leg Raises - 2 x daily - 7 x weekly - 2 sets - 10 reps  Supine Posterior Pelvic Tilt - 2 x daily - 7 x weekly - 1 sets - 10 reps - 5 second hold  Supine Double Knee to Chest - 2 x daily - 7 x weekly - 1 sets - 10 reps - 5 second hold  Sit to Stand Without Arm Support    Consulted and Agree with Plan of Care Patient             Patient will benefit from skilled therapeutic intervention in order to improve the following deficits and impairments:  Pain, Improper body mechanics, Increased fascial restricitons, Abnormal gait, Decreased balance, Impaired flexibility, Decreased activity tolerance, Decreased range of motion, Decreased strength, Decreased mobility, Postural dysfunction  Visit Diagnosis: Low back pain, unspecified back pain laterality, unspecified chronicity, unspecified whether sciatica present  Muscle weakness (generalized)     Problem List Patient Active Problem List   Diagnosis Date Noted   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   Lumbago with sciatica, right side 09/02/2020   Chronic bilateral low back pain with bilateral sciatica 09/02/2020   Vitamin D deficiency 04/16/2020   Depression 04/16/2020   Class 1 obesity with serious comorbidity and body mass index (BMI) of 33.0 to 33.9 in adult 04/16/2020   Other fatigue 02/14/2018   Shortness of breath on exertion 02/14/2018   Type 2 diabetes mellitus with hyperglycemia, with long-term current use of  insulin ([place]) 02/14/2018   Abnormal EKG 02/14/2018   Maxillary sinusitis, acute 12/17/2015   Cough 12/17/2015   Fever 12/17/2015   Diabetes mellitus without complication ([place]) [id number]   Tooth pain 06/21/2013   Orthostatic lightheadedness  05/17/2013   Health maintenance examination 05/17/2013   Groin strain 11/10/2012   Hypogonadism, male 01/11/2012   Type II or unspecified type diabetes mellitus without mention of complication, not stated as uncontrolled 07/08/2011   HTN (hypertension), benign 07/08/2011   Hyperlipidemia 07/08/2011   BPH (benign prostatic hypertrophy) 07/08/2011   Hx of adenomatous colonic polyps 07/08/2011  5:12 PM, 06/23/21 Josue Hector PT DPT  Physical Therapist with [facility]  [phone] [place] [address] [place], [place], [address] Phone: [phone]   Fax:  [phone]  Name: [name] [name] MRN: [id number] Date of Birth: [birth year]

## 2021-06-24 ENCOUNTER — Telehealth: Payer: Self-pay

## 2021-06-24 MED ORDER — LEVEMIR FLEXTOUCH 100 UNIT/ML ~~LOC~~ SOPN: 50.0000 [IU] | PEN_INJECTOR | Freq: Every evening | SUBCUTANEOUS | 6 refills | Status: DC

## 2021-06-24 NOTE — Telephone Encounter (Signed)
Pt advised refill sent. °

## 2021-06-24 NOTE — Telephone Encounter (Signed)
Patient refill request. Per [facility], they do not have new prescription authorization that was written on 05/22/2021. Can we please resubmit prescription for patient?  He is out of meds now.  Thank you.  insulin detemir (LEVEMIR FLEXTOUCH) 100 UNIT/ML FlexPen [id number]    [facility] [place], [address] AT [address]

## 2021-06-26 ENCOUNTER — Other Ambulatory Visit (INDEPENDENT_AMBULATORY_CARE_PROVIDER_SITE_OTHER): Payer: Self-pay | Admitting: Physician Assistant

## 2021-06-26 DIAGNOSIS — Z794 Long term (current) use of insulin: Secondary | ICD-10-CM

## 2021-06-26 DIAGNOSIS — E1169 Type 2 diabetes mellitus with other specified complication: Secondary | ICD-10-CM

## 2021-06-30 ENCOUNTER — Encounter (INDEPENDENT_AMBULATORY_CARE_PROVIDER_SITE_OTHER): Payer: Self-pay | Admitting: Physician Assistant

## 2021-06-30 ENCOUNTER — Other Ambulatory Visit: Payer: Self-pay

## 2021-06-30 ENCOUNTER — Ambulatory Visit (INDEPENDENT_AMBULATORY_CARE_PROVIDER_SITE_OTHER): Payer: Medicare Other | Admitting: Physician Assistant

## 2021-06-30 VITALS — BP 118/70 | HR 78 | Temp 98.1°F | Ht 70.0 in | Wt 228.0 lb

## 2021-06-30 DIAGNOSIS — E1169 Type 2 diabetes mellitus with other specified complication: Secondary | ICD-10-CM

## 2021-06-30 DIAGNOSIS — F3289 Other specified depressive episodes: Secondary | ICD-10-CM

## 2021-06-30 DIAGNOSIS — E559 Vitamin D deficiency, unspecified: Secondary | ICD-10-CM | POA: Diagnosis not present

## 2021-06-30 DIAGNOSIS — Z6834 Body mass index (BMI) 34.0-34.9, adult: Secondary | ICD-10-CM | POA: Diagnosis not present

## 2021-06-30 DIAGNOSIS — Z794 Long term (current) use of insulin: Secondary | ICD-10-CM

## 2021-06-30 DIAGNOSIS — E669 Obesity, unspecified: Secondary | ICD-10-CM | POA: Diagnosis not present

## 2021-06-30 MED ORDER — BD PEN NEEDLE NANO 2ND GEN 32G X 4 MM MISC: 2 refills | Status: DC

## 2021-06-30 MED ORDER — ESCITALOPRAM OXALATE 20 MG PO TABS: ORAL_TABLET | ORAL | 0 refills | Status: DC

## 2021-06-30 MED ORDER — VITAMIN D3 125 MCG (5000 UT) PO CAPS: 5000.0000 [IU] | ORAL_CAPSULE | Freq: Every day | ORAL | 0 refills | Status: DC

## 2021-06-30 MED ORDER — BUPROPION HCL ER (SR) 150 MG PO TB12: 150.0000 mg | ORAL_TABLET | Freq: Two times a day (BID) | ORAL | 0 refills | Status: DC

## 2021-06-30 NOTE — Telephone Encounter (Signed)
Pt last seen by Tracey Aguilar, PA-C.  

## 2021-07-01 NOTE — Progress Notes (Signed)
Chief Complaint:   OBESITY [name] [name] is here to discuss his progress with his obesity treatment plan along with follow-up of his obesity related diagnoses. [name] [name] is on the Category 4 Plan and states he is following his eating plan approximately 75% of the time. [name] [name] states he is doing [place] [facility] and home exercises for 15 minutes 3 times per week.  Today's visit was #: 91 Starting weight: 273 lbs Starting date: 02/14/2018 Today's weight: 228 lbs Today's date:06/30/2021 Total lbs lost to date: 45 lbs Total lbs lost since last in-office visit: 9 lbs  Interim History: [name] [name] feels that Ozempic 2 mg is working well to help with appetite control until the evening. He notes that he is not very hungry for breakfast and therefore doesn't eat very much. He is making up for those calories with dinner.  Subjective:   1. Type 2 diabetes mellitus with other specified complication, with long-term current use of insulin (HCC) [name] [name] is on Ozempic 2 mg which helps decrease his appetite. His last A1C level was 5.9.  2. Vitamin D deficiency [name] [name] is on Vitamin D. His last Vitamin D level was at goal.  3. Other depression, with emotional eating  [name] [name] is on Lexapro and Wellbutrin. His mood is stable. He denies suicidal ideals and homicidal ideals.  Assessment/Plan:   1. Type 2 diabetes mellitus with other specified complication, with long-term current use of insulin (HCC) Good blood sugar control is important to decrease the likelihood of diabetic complications such as nephropathy, neuropathy, limb loss, blindness, coronary artery disease, and death. We will refill needles today with no refills. Intensive lifestyle modification including diet, exercise and weight loss are the first line of treatment for diabetes.   - Insulin Pen Needle (BD PEN NEEDLE NANO 2ND GEN) 32G X 4 MM MISC; USE TO INJECT 4 TIMES DAILY  Dispense: 100 each; Refill: 2  2. Vitamin D deficiency Low Vitamin D level  contributes to fatigue and are associated with obesity, breast, and colon cancer. We will refill prescription Vitamin D for 1 month with no refills. [name] [name] will follow-up for routine testing of Vitamin D, at least 2-3 times per year to avoid over-replacement.  - Cholecalciferol (VITAMIN D3) 125 MCG (5000 UT) CAPS; Take 1 capsule (5,000 Units total) by mouth daily.  Dispense: 30 capsule; Refill: 0  3. Other depression, with emotional eating  Behavior modification techniques were discussed today to help [name] [name] deal with his emotional/non-hunger eating behaviors.  Orders and follow up as documented in patient record.    - buPROPion (WELLBUTRIN SR) 150 MG 12 hr tablet; Take 1 tablet (150 mg total) by mouth 2 (two) times daily.  Dispense: 180 tablet; Refill: 0  - escitalopram (LEXAPRO) 20 MG tablet; TAKE 1 TABLET(20 MG) BY MOUTH DAILY  Dispense: 90 tablet; Refill: 0  4. Class 1 obesity with serious comorbidity and body mass index (BMI) of 34.0 to 34.9 in adult, unspecified obesity type, current bmi 32.71 [name] [name] is currently in the action stage of change. As such, his goal is to continue with weight loss efforts. He has agreed to the Category 4 Plan.   Exercise goals: No exercise has been prescribed at this time.  Behavioral modification strategies: meal planning and cooking strategies and keeping healthy foods in the home.  [name] [name] has agreed to follow-up with our clinic in 4 weeks. He was informed of the importance of frequent follow-up visits to maximize his success with intensive lifestyle modifications for his multiple health conditions.  Objective:   Blood pressure 118/70, pulse 78, temperature 98.1 F (36.7 C), height '5\' 10"'$  (1.778 m), weight 228 lb (103.4 kg), SpO2 96 %. Body mass index is 32.71 kg/m.  General: Cooperative, alert, well developed, in no acute distress. HEENT: Conjunctivae and lids unremarkable. Cardiovascular: Regular rhythm.  Lungs: Normal work of  breathing. Neurologic: No focal deficits.   Lab Results  Component Value Date   CREATININE 0.96 04/21/2021   BUN 23 04/21/2021   NA 138 04/21/2021   K 4.1 04/21/2021   CL 100 04/21/2021   CO2 24 04/21/2021   Lab Results  Component Value Date   ALT 19 04/21/2021   AST 12 04/21/2021   ALKPHOS 37 (L) 04/21/2021   BILITOT 0.4 04/21/2021   Lab Results  Component Value Date   HGBA1C 5.9 (H) 04/21/2021   HGBA1C 5.3 12/02/2020   HGBA1C 5.9 (H) 08/29/2020   HGBA1C 5.7 (H) 05/09/2020   HGBA1C 5.8 (H) 12/12/2019   Lab Results  Component Value Date   INSULIN 5.8 08/29/2020   INSULIN 5.3 05/09/2020   Lab Results  Component Value Date   TSH 1.25 02/05/2020   Lab Results  Component Value Date   CHOL 134 04/21/2021   HDL 46 04/21/2021   LDLCALC 69 04/21/2021   LDLDIRECT 112.0 07/31/2016   TRIG 101 04/21/2021   CHOLHDL 2.9 04/21/2021   Lab Results  Component Value Date   VD25OH 42.0 04/21/2021   VD25OH 59.0 01/28/2021   VD25OH 54.6 08/29/2020   Lab Results  Component Value Date   WBC 5.8 05/22/2021   HGB 12.4 (L) 05/22/2021   HCT 37.1 (L) 05/22/2021   MCV 85.0 05/22/2021   PLT 284.0 05/22/2021   No results found for: IRON, TIBC, FERRITIN  Obesity Behavioral Intervention:   Approximately 15 minutes were spent on the discussion below.  ASK: We discussed the diagnosis of obesity with [name] [name] today and [name] [name] agreed to give [name] permission to discuss obesity behavioral modification therapy today.  ASSESS: [name] [name] has the diagnosis of obesity and his BMI today is 32.7. [name] [name] is in the action stage of change.   ADVISE: [name] [name] was educated on the multiple health risks of obesity as well as the benefit of weight loss to improve his health. He was advised of the need for long term treatment and the importance of lifestyle modifications to improve his current health and to decrease his risk of future health problems.  AGREE: Multiple dietary modification options and  treatment options were discussed and [name] [name] agreed to follow the recommendations documented in the above note.  ARRANGE: [name] [name] was educated on the importance of frequent visits to treat obesity as outlined per CMS and USPSTF guidelines and agreed to schedule his next follow up appointment today.  Attestation Statements:   Reviewed by clinician on day of visit: allergies, medications, problem list, medical history, surgical history, family history, social history, and previous encounter notes.  I, [name] [name], am acting as Location manager for [employer], PA-C.  I have reviewed the above documentation for accuracy and completeness, and I agree with the above. [name] Potash, PA-C

## 2021-07-07 ENCOUNTER — Ambulatory Visit (HOSPITAL_COMMUNITY): Payer: Medicare Other | Admitting: Physical Therapy

## 2021-07-07 ENCOUNTER — Other Ambulatory Visit: Payer: Self-pay

## 2021-07-07 ENCOUNTER — Ambulatory Visit (HOSPITAL_COMMUNITY): Payer: Medicare Other | Attending: Neurosurgery | Admitting: Physical Therapy

## 2021-07-07 DIAGNOSIS — R350 Frequency of micturition: Secondary | ICD-10-CM | POA: Diagnosis not present

## 2021-07-07 DIAGNOSIS — M6281 Muscle weakness (generalized): Secondary | ICD-10-CM | POA: Diagnosis not present

## 2021-07-07 DIAGNOSIS — M545 Low back pain, unspecified: Secondary | ICD-10-CM

## 2021-07-07 DIAGNOSIS — N401 Enlarged prostate with lower urinary tract symptoms: Secondary | ICD-10-CM | POA: Diagnosis not present

## 2021-07-08 ENCOUNTER — Encounter (HOSPITAL_COMMUNITY): Payer: Self-pay | Admitting: Physical Therapy

## 2021-07-08 NOTE — Therapy (Signed)
[place], [place], [address] Phone: [phone]   Fax:  [phone]  Physical Therapy Treatment  Patient Details  Name: [name] [name] MRN: [id number] Date of Birth: 30-Dec-1950 Referring Provider (PT): Erline Levine MD   Encounter Date: 07/07/2021   PT End of Session - 07/08/21 0823     Visit Number 23    Number of Visits 24    Date for PT Re-Evaluation 07/07/21    Authorization Type Medicare A/ BCBS supplement 2nd (no auth)    Progress Note Due on Visit 24    PT Start Time 1450    PT Stop Time 1550    PT Time Calculation (min) 60 min    Activity Tolerance Patient tolerated treatment well    Behavior During Therapy [facility] for tasks assessed/performed             Past Medical History:  Diagnosis Date   Allergic rhinitis, unspecified    Allergy testing via allergist 04/2017--mostly neg.  Dr. Donneta Romberg recommended referral to ENT so i did this.   Aortic root dilatation ([place]) 2019   2019, 41 mm, 2022, 39 mm-->rpt 1 yr.   Back pain    LBP->DDD/spondylosis w/ intermittent radiulopathy-type leg pains, + bilat hip and thigh pain-->spinal stenosis L4-5, L5-S1->Dr. Vertell Limber to do decomp and fusion surgery, scheduled for 12/03/20   BPH (benign prostatic hypertrophy)    no hydro on renal u/s 05/2020   Chronic nasal congestion    DDD (degenerative disc disease), lumbar 2010   laminectomy 01/2009.  MR rpt 05/2019->advanced DDD/spondylosis L3-S1, with mod/sev L4-5 spinal stenosis-->plan for surg per Dr. Vertell Limber as of 09/2020   Depression    Hospitalized for suicidal ideation 1992.  Has been on lexapro since 10/2008.   Diabetes mellitus type II    Dx'd approximately 06/2008.  No retinopathy as of 02/2017 ophth.   Erectile dysfunction    Heart murmur, systolic [id number]   mild intensity, asymptomatic; echo 01/2018 valves fine->obs   History of colon polyps 01/2016   Recall 3 yrs   History of pneumonia 2008   Hospitalized    History of scarlet fever    age 70   Hyperlipidemia, mixed 1993   myalgias on pravastatin   Hypertension 2009   "marked chronic cardiomegaly" on CXR 01/2009 per old records.   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 Delice Lesch   MRI brain 03/01/20 NORMAL   OSA on CPAP 2004; 2019   Back on CPAP 04/2018.  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  02/21/16: tubular adenoma.  Recall 3 yrs (Dr. Ardis Hughs).   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 Vertell Limber)   [place]     age 70   TRANSTHORACIC ECHOCARDIOGRAM  02/22/2018   EF 55-60%, grd I DD. Aortic root dilation 41 mm.  05/2021 echo unchanged except aortic root dilation down to 39 mm. Plan rpt 1 yr to monitor aortic root dilation.   URETHRAL DILATION     2 as a child, one as an adult   VASECTOMY  1994   VITRECTOMY      There were no vitals filed for this visit.   Subjective Assessment - 07/08/21 0822     Subjective Had some sharp pains about his RT hip this week. Comes and goes.    Pertinent History Lumbar fusion 12/03/20    Limitations Lifting;Standing;Walking;House hold activities    How long can you stand comfortably? 15 minutes    Patient Stated Goals Get rid of pain, walk the dogs more, walk longer, improve balance    Currently in Pain? Yes     Pain Score 2     Pain Location Hip    Pain Descriptors / Indicators Sharp    Pain Onset More than a month ago                           Adult Aquatic Therapy - 07/08/21 0833       Treatment   Gait dynamic warmup pool walking, sidestepping 3RT    Exercises heel raise 3x10, mini squats 3 x 10, single arm dumbbell pull down 2 x 10, double arm dumbbell pull down 2x10, dumbbell twists 2 x 10, lumbar traction with float 5 x 1' throughout session                          PT Short Term Goals - 04/10/21 0931       PT SHORT TERM GOAL #1   Title Patient will be independent with initial HEP and self-management strategies to improve functional outcomes    Time 2    Period Weeks    Status Achieved    Target Date 03/24/21               PT Long Term Goals - 06/09/21 1127       PT LONG TERM GOAL #1   Title Patient will have equal to or > 4+/5 MMT throughout BLE to improve ability to perform functional mobility, stair ambulation and ADLs.    Baseline See MMT    Time 4    Period Weeks    Status Achieved      PT LONG TERM GOAL #2   Title Patient will report at least 75% overall improvement in subjective complaint to indicate improvement in ability to perform ADLs.    Baseline Current reports 80%    Time 4    Period Weeks    Status Achieved      PT LONG TERM GOAL #3   Title Patient will be able to maintain tandem stance >30 seconds on foam bilaterally to show improved stability and ability to ambulate outdoors    Baseline 04/01/21 - tandem on foam left foot forward 12 seconds; right foot forward 27 seconds. 5/31 acheived (30 sec bilateral but min sway with RLE forward)    Time 4    Period Weeks    Status Achieved      PT LONG TERM GOAL #4   Title Patient will be able to walk >30  minutes with no increased back pain for walking dog and exercising outdoors.    Baseline Currently 15 minutes at most    Time 4    Period Weeks    Status On-going                    Plan - 07/08/21 [id number]     Clinical Impression Statement Patient progressing well with activity tolerance. Able to perform all activity with no increased complaint of pain. Requires verbal cues for proper weight shift and knee translation during squatting. Patient noting pain relief end of session. Patient will continue to benefit from skilled therapy services to reduce deficits and improve functional ability. Reassess next visit. Likely DC to HEP.    Personal Factors and Comorbidities Comorbidity 2    Comorbidities arthritis, DM    Examination-Activity Limitations Lift;Stand;Locomotion Level;Transfers;Carry;Stairs;Bend;Squat    Examination-Participation Restrictions Community Activity;Yard Work    Stability/Clinical Decision Making Stable/Uncomplicated    Rehab Potential Good    PT Frequency 1x / week    PT Duration 4 weeks    PT Treatment/Interventions ADLs/Self Care Home Management;Aquatic Therapy;Canalith Repostioning;Cryotherapy;Electrical Stimulation;Contrast Bath;Therapeutic exercise;Patient/family education;Orthotic Fit/Training;Manual lymph drainage;Compression bandaging;Taping;Vasopneumatic Device;Joint Manipulations;Splinting;Energy conservation;Spinal Manipulations;Visual/perceptual remediation/compensation;Passive range of motion;Dry needling;Manual techniques;Scar mobilization;Vestibular;Balance training;Neuromuscular re-education;DME Instruction;Gait training;Moist Heat;Traction;Stair training;Functional mobility training;Therapeutic activities;Ultrasound;Parrafin;Fluidtherapy;Biofeedback;Iontophoresis '4mg'$ /ml Dexamethasone    PT Next Visit Plan Reassess next visit    PT Home Exercise Plan Eval: ab set, hip abd/ add iso, bridge, RTB for clams 4/26: pelvic tilt, glute set; 04/03/21 - tandem stance on solid/compliant surfaces with/without head/arm movements.; 5/17: QL stretch 5/19 ab march, sit to stand; 05/06/21: self traction, sitting on theraball, heel slides on  theraball.6/22 Supine Transversus Abdominis Bracing - Hands on Stomach - 2 x daily - 7 x weekly - 1 sets - 10 reps - 5 second hold  Supine March - 2 x daily - 7 x weekly - 2 sets - 10 reps  Supine Straight Leg Raises - 2 x daily - 7 x weekly - 2 sets - 10 reps  Supine Posterior Pelvic Tilt - 2 x daily - 7 x weekly - 1 sets - 10 reps - 5 second hold  Supine Double Knee to Chest - 2 x daily - 7 x weekly - 1 sets - 10 reps - 5 second hold  Sit to Stand Without Arm Support    Consulted and Agree with Plan of Care Patient             Patient will benefit from skilled therapeutic intervention in order to improve the following deficits and impairments:  Pain, Improper body mechanics, Increased fascial restricitons, Abnormal gait, Decreased balance, Impaired flexibility, Decreased activity tolerance, Decreased range of motion, Decreased strength, Decreased mobility, Postural dysfunction  Visit Diagnosis: Low back pain, unspecified back pain laterality, unspecified chronicity, unspecified whether sciatica present  Muscle weakness (generalized)     Problem List Patient Active Problem List   Diagnosis Date Noted   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   Lumbago with sciatica, right side 09/02/2020   Chronic bilateral low back pain with bilateral sciatica 09/02/2020   Vitamin D deficiency 04/16/2020   Depression 04/16/2020   Class 1 obesity with serious comorbidity and body mass index (BMI) of 33.0 to 33.9 in adult 04/16/2020   Other fatigue 02/14/2018   Shortness of breath on exertion 02/14/2018   Type 2 diabetes mellitus with hyperglycemia, with long-term current use of insulin (HCC) 02/14/2018   Abnormal EKG 02/14/2018   Maxillary sinusitis, acute 12/17/2015  Cough 12/17/2015   Fever 12/17/2015   Diabetes mellitus without complication ([place]) [id number]   Tooth pain 06/21/2013   Orthostatic lightheadedness 05/17/2013   Health maintenance examination 05/17/2013   Groin strain  11/10/2012   Hypogonadism, male 01/11/2012   Type II or unspecified type diabetes mellitus without mention of complication, not stated as uncontrolled 07/08/2011   HTN (hypertension), benign 07/08/2011   Hyperlipidemia 07/08/2011   BPH (benign prostatic hypertrophy) 07/08/2011   Hx of adenomatous colonic polyps 07/08/2011   8:34 AM, 07/08/21 Josue Hector PT DPT  Physical Therapist with [facility]  [phone] [place] [address] [place], [place], [address] Phone: [phone]   Fax:  [phone]  Name: [name] [name] MRN: [id number] Date of Birth: 1950/12/21

## 2021-07-14 ENCOUNTER — Ambulatory Visit (HOSPITAL_COMMUNITY): Payer: Medicare Other | Admitting: Physical Therapy

## 2021-07-14 ENCOUNTER — Encounter (HOSPITAL_COMMUNITY): Payer: Self-pay | Admitting: Physical Therapy

## 2021-07-14 ENCOUNTER — Other Ambulatory Visit: Payer: Self-pay

## 2021-07-14 DIAGNOSIS — M545 Low back pain, unspecified: Secondary | ICD-10-CM

## 2021-07-14 DIAGNOSIS — M6281 Muscle weakness (generalized): Secondary | ICD-10-CM | POA: Diagnosis not present

## 2021-07-14 NOTE — Therapy (Signed)
[place], [place], [address] Phone: [phone]   Fax:  [phone]  Physical Therapy Treatment  Patient Details  Name: [name] [name] MRN: [id number] Date of Birth: Feb 07, 1951 Referring Provider (PT): Erline Levine MD  PHYSICAL THERAPY DISCHARGE SUMMARY  Visits from Start of Care: 24  Current functional level related to goals / functional outcomes: See below    Remaining deficits: See below    Education / Equipment: See assessment    Patient agrees to discharge. Patient goals were met. Patient is being discharged due to being pleased with the current functional level.  Encounter Date: 07/14/2021   PT End of Session - 07/14/21 1117     Visit Number 24    Number of Visits 24    Date for PT Re-Evaluation 07/14/21    Authorization Type Medicare A/ BCBS supplement 2nd (no auth)    Progress Note Due on Visit 24    PT Start Time 1113    PT Stop Time 1155    PT Time Calculation (min) 42 min    Activity Tolerance Patient tolerated treatment well    Behavior During Therapy [facility] for tasks assessed/performed             Past Medical History:  Diagnosis Date   Allergic rhinitis, unspecified    Allergy testing via allergist 04/2017--mostly neg.  Dr. Donneta Romberg recommended referral to ENT so i did this.   Aortic root dilatation ([place]) 2019   2019, 41 mm, 2022, 39 mm-->rpt 1 yr.   Back pain    LBP->DDD/spondylosis w/ intermittent radiulopathy-type leg pains, + bilat hip and thigh pain-->spinal stenosis L4-5, L5-S1->Dr. Vertell Limber to do decomp and fusion surgery, scheduled for 12/03/20   BPH (benign prostatic hypertrophy)    no hydro on renal u/s 05/2020   Chronic nasal congestion    DDD (degenerative disc disease), lumbar 2010   laminectomy 01/2009.  MR rpt 05/2019->advanced DDD/spondylosis L3-S1, with mod/sev L4-5 spinal stenosis-->plan for surg per Dr. Vertell Limber as of 09/2020   Depression    Hospitalized for suicidal  ideation 1992.  Has been on lexapro since 10/2008.   Diabetes mellitus type II    Dx'd approximately 06/2008.  No retinopathy as of 02/2017 ophth.   Erectile dysfunction    Heart murmur, systolic 26/3785   mild intensity, asymptomatic; echo 01/2018 valves fine->obs   History of colon polyps 01/2016   Recall 3 yrs   History of pneumonia 2008   Hospitalized   History of scarlet fever    age 70   Hyperlipidemia, mixed 1993   myalgias on pravastatin   Hypertension 2009   "marked chronic cardiomegaly" on CXR 01/2009 per old records.   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 Delice Lesch   MRI brain 03/01/20 NORMAL   OSA on CPAP 2004; 2019   Back on CPAP 04/2018.  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 and surgery for this (?) around [place]    Past Surgical History:  Procedure Laterality Date   APPENDECTOMY  1972  COLONOSCOPY  X 4   Most recent was 2007 [place], [place]), with removal of polyps in the first two.  02/21/16: tubular adenoma.  Recall 3 yrs (Dr. Ardis Hughs).   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 Vertell Limber)   [place]     age 70   TRANSTHORACIC ECHOCARDIOGRAM  02/22/2018   EF 55-60%, grd I DD. Aortic root dilation 41 mm.  05/2021 echo unchanged except aortic root dilation down to 39 mm. Plan rpt 1 yr to monitor aortic root dilation.   URETHRAL DILATION     2 as a child, one as an adult   VASECTOMY  1994   VITRECTOMY      There were no vitals filed for this visit.   Subjective Assessment - 07/14/21  1117     Subjective Patient says he is doing good. Still having low level pain in lumbar, but feels good with current HEP. Notes that he aches some with a strenuous day, but feels better in about a day. He says he has come to expect this. Reports 85% improvement overall.    Pertinent History Lumbar fusion 12/03/20    Limitations Lifting;Standing;Walking;House hold activities    How long can you stand comfortably? 15 minutes    Patient Stated Goals Get rid of pain, walk the dogs more, walk longer, improve balance    Currently in Pain? Yes    Pain Score 2     Pain Location Hip    Pain Orientation Posterior;Right    Pain Descriptors / Indicators Aching    Pain Onset More than a month ago    Pain Frequency Intermittent                [facility] PT Assessment - 07/14/21 0001       Assessment   Medical Diagnosis LBP    Referring Provider (PT) Erline Levine MD    Onset Date/Surgical Date 12/03/20      Home Environment   Living Environment Private residence      Prior Function   Level of Independence Independent      Cognition   Overall Cognitive Status Within Functional Limits for tasks assessed      Observation/Other Assessments   Focus on Therapeutic Outcomes (FOTO)  56% function   was 51%     AROM   Lumbar Flexion WNL    Lumbar Extension 30% limited    Lumbar - Right Side Bend WNL    Lumbar - Left Side Bend WNL      Strength   Right Hip Flexion 5/5    Right Hip Extension 4+/5    Right Hip ABduction 4+/5    Left Hip Flexion 5/5    Left Hip Extension 4+/5    Left Hip ABduction 4+/5    Right Knee Extension 5/5    Left Knee Extension 5/5                           [facility] Adult PT Treatment/Exercise - 07/14/21 0001       Lumbar Exercises: Supine   Other Supine Lumbar Exercises isometric hip abduction/ adduction 5 x 5", hip bridge 5 x 5"                      PT Short  Term Goals - 04/10/21 0931       PT SHORT TERM GOAL #1   Title Patient  will be independent with initial HEP and self-management strategies to improve functional outcomes    Time 2    Period Weeks    Status Achieved    Target Date 03/24/21               PT Long Term Goals - 07/14/21 1158       PT LONG TERM GOAL #1   Title Patient will have equal to or > 4+/5 MMT throughout BLE to improve ability to perform functional mobility, stair ambulation and ADLs.    Baseline See MMT    Time 4    Period Weeks    Status Achieved      PT LONG TERM GOAL #2   Title Patient will report at least 75% overall improvement in subjective complaint to indicate improvement in ability to perform ADLs.    Baseline Current reports 85%    Time 4    Period Weeks    Status Achieved      PT LONG TERM GOAL #3   Title Patient will be able to maintain tandem stance >30 seconds on foam bilaterally to show improved stability and ability to ambulate outdoors    Baseline 04/01/21 - tandem on foam left foot forward 12 seconds; right foot forward 27 seconds. 5/31 acheived (30 sec bilateral but min sway with RLE forward)    Time 4    Period Weeks    Status Achieved      PT LONG TERM GOAL #4   Title Patient will be able to walk >30 minutes with no increased back pain for walking dog and exercising outdoors.    Baseline Currently 15 minutes walking, but can stand > 30 min    Time 4    Period Weeks    Status Partially Met                   Plan - 07/14/21 1159     Clinical Impression Statement Patient has made good progress toward therapy goals. Shows good functional improvent and subjective report. Still limited by low level back/ hip pain with increased activity but improved with rest and therapy exercises. Performed reassessment and reviewed HEP. Educated patient on hip isometric MET for SI joint pain. Issued updated HEP handout and answered all patient questions. Patient being DC today with all goals met/ partially met. Patient encouraged to follow up with therapy  services with any further questions or concerns.    Personal Factors and Comorbidities Comorbidity 2    Comorbidities arthritis, DM    Examination-Activity Limitations Lift;Stand;Locomotion Level;Transfers;Carry;Stairs;Bend;Squat    Examination-Participation Restrictions Community Activity;Yard Work    Stability/Clinical Decision Making Stable/Uncomplicated    Rehab Potential Good    PT Treatment/Interventions ADLs/Self Care Home Management;Aquatic Therapy;Canalith Repostioning;Cryotherapy;Electrical Stimulation;Contrast Bath;Therapeutic exercise;Patient/family education;Orthotic Fit/Training;Manual lymph drainage;Compression bandaging;Taping;Vasopneumatic Device;Joint Manipulations;Splinting;Energy conservation;Spinal Manipulations;Visual/perceptual remediation/compensation;Passive range of motion;Dry needling;Manual techniques;Scar mobilization;Vestibular;Balance training;Neuromuscular re-education;DME Instruction;Gait training;Moist Heat;Traction;Stair training;Functional mobility training;Therapeutic activities;Ultrasound;Parrafin;Fluidtherapy;Biofeedback;Iontophoresis 48m/ml Dexamethasone    PT Next Visit Plan DC to HEP    PT Home Exercise Plan Eval: ab set, hip abd/ add iso, bridge, RTB for clams 4/26: pelvic tilt, glute set; 04/03/21 - tandem stance on solid/compliant surfaces with/without head/arm movements.; 5/17: QL stretch 5/19 ab march, sit to stand; 05/06/21: self traction, sitting on theraball, heel slides on theraball.6/22 Supine Transversus Abdominis Bracing - Hands on Stomach - 2 x daily - 7  x weekly - 1 sets - 10 reps - 5 second hold  Supine March - 2 x daily - 7 x weekly - 2 sets - 10 reps  Supine Straight Leg Raises - 2 x daily - 7 x weekly - 2 sets - 10 reps  Supine Posterior Pelvic Tilt - 2 x daily - 7 x weekly - 1 sets - 10 reps - 5 second hold  Supine Double Knee to Chest - 2 x daily - 7 x weekly - 1 sets - 10 reps - 5 second hold  Sit to Stand Without Arm Support    Consulted and Agree  with Plan of Care Patient             Patient will benefit from skilled therapeutic intervention in order to improve the following deficits and impairments:  Pain, Improper body mechanics, Increased fascial restricitons, Abnormal gait, Decreased balance, Impaired flexibility, Decreased activity tolerance, Decreased range of motion, Decreased strength, Decreased mobility, Postural dysfunction  Visit Diagnosis: Low back pain, unspecified back pain laterality, unspecified chronicity, unspecified whether sciatica present  Muscle weakness (generalized)     Problem List Patient Active Problem List   Diagnosis Date Noted   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   Lumbago with sciatica, right side 09/02/2020   Chronic bilateral low back pain with bilateral sciatica 09/02/2020   Vitamin D deficiency 04/16/2020   Depression 04/16/2020   Class 1 obesity with serious comorbidity and body mass index (BMI) of 33.0 to 33.9 in adult 04/16/2020   Other fatigue 02/14/2018   Shortness of breath on exertion 02/14/2018   Type 2 diabetes mellitus with hyperglycemia, with long-term current use of insulin (HCC) 02/14/2018   Abnormal EKG 02/14/2018   Maxillary sinusitis, acute 12/17/2015   Cough 12/17/2015   Fever 12/17/2015   Diabetes mellitus without complication ([place]) 03/70/4888   Tooth pain 06/21/2013   Orthostatic lightheadedness 05/17/2013   Health maintenance examination 05/17/2013   Groin strain 11/10/2012   Hypogonadism, male 01/11/2012   Type II or unspecified type diabetes mellitus without mention of complication, not stated as uncontrolled 07/08/2011   HTN (hypertension), benign 07/08/2011   Hyperlipidemia 07/08/2011   BPH (benign prostatic hypertrophy) 07/08/2011   Hx of adenomatous colonic polyps 07/08/2011   12:05 PM, 07/14/21 Josue Hector PT DPT  Physical Therapist with [place]  [facility]  [phone] [place], [place], [address] Phone: [phone]   Fax:  [phone]  Name: [name] [name] MRN: [id number] Date of Birth: [birth year]

## 2021-07-22 ENCOUNTER — Telehealth: Payer: Self-pay | Admitting: Family Medicine

## 2021-07-22 NOTE — Telephone Encounter (Signed)
Copied from [place] [id number]. Topic: Medicare AWV >> Jul 22, 2021  1:29 PM Cher Nakai R wrote: Reason for CRM: Left message  re: [facility] schedule change - new appt time is at 12:15 on Aug 31,2022-srs

## 2021-07-28 ENCOUNTER — Encounter (HOSPITAL_COMMUNITY): Payer: Medicare Other | Admitting: Physical Therapy

## 2021-07-28 ENCOUNTER — Ambulatory Visit (INDEPENDENT_AMBULATORY_CARE_PROVIDER_SITE_OTHER): Payer: Medicare Other | Admitting: Physician Assistant

## 2021-07-30 ENCOUNTER — Ambulatory Visit (INDEPENDENT_AMBULATORY_CARE_PROVIDER_SITE_OTHER): Payer: Medicare Other | Admitting: *Deleted

## 2021-07-30 DIAGNOSIS — Z0000 Encounter for general adult medical examination without abnormal findings: Secondary | ICD-10-CM

## 2021-07-30 DIAGNOSIS — Z1211 Encounter for screening for malignant neoplasm of colon: Secondary | ICD-10-CM

## 2021-07-30 NOTE — Progress Notes (Signed)
Subjective:   [name] [name] is a 70 y.o. male who presents for Medicare Annual/Subsequent preventive examination.  I connected with  [name] [name] on 07/30/21 by a telephone enabled telemedicine application and verified that I am speaking with the correct person using two identifiers.   I discussed the limitations of evaluation and management by telemedicine. The patient expressed understanding and agreed to proceed.   Review of Systems     Cardiac Risk Factors include: advanced age (>75mn, >>93women);diabetes mellitus;male gender;obesity (BMI >30kg/m2);hypertension     Objective:    Today's Vitals   07/30/21 1230  PainSc: 3    There is no height or weight on file to calculate BMI.  Advanced Directives 07/30/2021 03/10/2021 12/02/2020 08/13/2020 02/05/2020 10/01/2019 07/13/2019  Does Patient Have a Medical Advance Directive? No No No No No No No  Does patient want to make changes to medical advance directive? - Yes (MAU/Ambulatory/Procedural Areas - Information given) - - - - -  Would patient like information on creating a medical advance directive? No - Patient declined - Yes (MAU/Ambulatory/Procedural Areas - Information given) - - - No - Patient declined    Current Medications (verified) Outpatient Encounter Medications as of 07/30/2021  Medication Sig   aspirin 81 MG EC tablet Take 1 tablet (81 mg total) by mouth daily.   atorvastatin (LIPITOR) 80 MG tablet TAKE 1 TABLET(80 MG) BY MOUTH DAILY   azelastine (ASTELIN) 0.1 % nasal spray Place 2 sprays into both nostrils 2 (two) times daily. Use in each nostril as directed   buPROPion (WELLBUTRIN SR) 150 MG 12 hr tablet Take 1 tablet (150 mg total) by mouth 2 (two) times daily.   Cholecalciferol (VITAMIN D3) 125 MCG (5000 UT) CAPS Take 1 capsule (5,000 Units total) by mouth daily.   Choline Fenofibrate (FENOFIBRIC ACID) 135 MG CPDR TAKE 1 CAPSULE BY MOUTH DAILY   escitalopram (LEXAPRO) 20 MG tablet TAKE 1 TABLET(20 MG) BY  MOUTH DAILY   finasteride (PROSCAR) 5 MG tablet TAKE 1 TABLET(5 MG) BY MOUTH DAILY   fluticasone (FLONASE) 50 MCG/ACT nasal spray Place 2 sprays into both nostrils daily. (Patient taking differently: Place 2 sprays into both nostrils daily as needed for allergies.)   gabapentin (NEURONTIN) 300 MG capsule Take 900 mg by mouth at bedtime.   glucose blood (ONE TOUCH ULTRA TEST) test strip 1 each by Other route 3 (three) times daily. Test sugars up to three times daily. ( insurance preference  DX E11.65 E11.9)   glucose blood test strip Per Insurance Preference- Test sugars up to three times daily DX E11.9   HYDROcodone-acetaminophen (NORCO/VICODIN) 5-325 MG tablet Take 1 tablet by mouth every 6 (six) hours as needed.   insulin detemir (LEVEMIR FLEXTOUCH) 100 UNIT/ML FlexPen Inject 50 Units into the skin every evening.   Insulin Pen Needle (BD PEN NEEDLE NANO 2ND GEN) 32G X 4 MM MISC USE TO INJECT 4 TIMES DAILY   Krill Oil 500 MG CAPS Take 500 mg by mouth daily.   metFORMIN (GLUCOPHAGE) 1000 MG tablet TAKE 1 TABLET BY MOUTH TWICE DAILY WITH A MEAL   methocarbamol (ROBAXIN) 500 MG tablet Take by mouth as needed.   Semaglutide, 2 MG/DOSE, (OZEMPIC, 2 MG/DOSE,) 8 MG/3ML SOPN Inject 2 mg into the skin once a week.   sodium chloride (OCEAN) 0.65 % nasal spray Place 1 spray into the nose daily as needed.   tamsulosin (FLOMAX) 0.4 MG CAPS capsule TAKE 2 CAPSULES(0.8 MG) BY MOUTH AT BEDTIME  vitamin B-12 (CYANOCOBALAMIN) 1000 MCG tablet Take 2,000 mcg by mouth daily.   Vitamin D, Ergocalciferol, (DRISDOL) 1.25 MG (50000 UNIT) CAPS capsule Take 1 capsule (50,000 Units total) by mouth every 7 (seven) days.   No facility-administered encounter medications on file as of 07/30/2021.    Allergies (verified) Simvastatin; Antihistamines, diphenhydramine-type; Codeine; Penicillins; and Sulfa antibiotics   History: Past Medical History:  Diagnosis Date   Allergic rhinitis, unspecified    Allergy testing via  allergist 04/2017--mostly neg.  Dr. Donneta Romberg recommended referral to ENT so i did this.   Aortic root dilatation ([place]) 2019   2019, 41 mm, 2022, 39 mm-->rpt 1 yr.   Back pain    LBP->DDD/spondylosis w/ intermittent radiulopathy-type leg pains, + bilat hip and thigh pain-->spinal stenosis L4-5, L5-S1->Dr. Vertell Limber to do decomp and fusion surgery, scheduled for 12/03/20   BPH (benign prostatic hypertrophy)    no hydro on renal u/s 05/2020   Chronic nasal congestion    DDD (degenerative disc disease), lumbar 2010   laminectomy 01/2009.  MR rpt 05/2019->advanced DDD/spondylosis L3-S1, with mod/sev L4-5 spinal stenosis-->plan for surg per Dr. Vertell Limber as of 09/2020   Depression    Hospitalized for suicidal ideation 1992.  Has been on lexapro since 10/2008.   Diabetes mellitus type II    Dx'd approximately 06/2008.  No retinopathy as of 02/2017 ophth.   Erectile dysfunction    Heart murmur, systolic [id number]   mild intensity, asymptomatic; echo 01/2018 valves fine->obs   History of colon polyps 01/2016   Recall 3 yrs   History of pneumonia 2008   Hospitalized   History of scarlet fever    age 88   Hyperlipidemia, mixed 1993   myalgias on pravastatin   Hypertension 2009   "marked chronic cardiomegaly" on CXR 01/2009 per old records.   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 Delice Lesch   MRI brain 03/01/20 NORMAL   OSA on CPAP 2004; 2019   Back on CPAP 04/2018.  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  02/21/16: tubular adenoma.  Recall 3 yrs (Dr. Ardis Hughs).   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 Vertell Limber)   [place]     age 50   TRANSTHORACIC ECHOCARDIOGRAM  02/22/2018   EF 55-60%, grd I DD. Aortic root dilation 41 mm.  05/2021 echo unchanged except aortic root dilation down to 39 mm. Plan rpt 1 yr to monitor aortic root dilation.   URETHRAL DILATION     2 as a child, one as an adult   VASECTOMY  1994   VITRECTOMY     Family History  Problem Relation Age of Onset  Heart disease Mother    Hyperlipidemia Mother    Stroke Mother    Diabetes Mother    Depression Mother    Alzheimer's disease Mother    Stroke Father    Hyperlipidemia Father    Heart disease Father    Diabetes Father    Hypertension Father    Obesity Father    Alcohol abuse Sister    Depression Sister    Social History   Socioeconomic History   Marital status: Married    Spouse name: Taher Wilch   Number of children: Not on file   Years of education: Not on file   Highest education level: Not on file  Occupational History   Occupation: Retired  Tobacco Use   Smoking status: Former   Smokeless tobacco: Never  Scientific laboratory technician Use: Never used  Substance and Sexual Activity   Alcohol use: Yes    Comment: social, 3-4 drinks month   Drug use: No   Sexual activity: Yes    Partners: Female  Other Topics Concern   Not on file  Social History Narrative   Divorced, then remarried.  Has 3 sons, no grandchildren.   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   No tobacco.  Rare ETOH.  No drug abuse.   Enjoys reading and spending time with his 2 dogs.   Right handed    Social Determinants of Health   Financial Resource Strain: Low Risk     Difficulty of Paying Living Expenses: Not hard at all  Food Insecurity: No Food Insecurity   Worried About Charity fundraiser in the Last Year: Never true   Ran Out of Food in the Last Year: Never true  Transportation Needs: No Transportation Needs   Lack of Transportation (Medical): No   Lack of Transportation (Non-Medical): No  Physical Activity: Sufficiently Active   Days of Exercise per Week: 4 days   Minutes of Exercise per Session: 50 min  Stress: No Stress Concern Present   Feeling of Stress : Not at all  Social Connections: Moderately Integrated   Frequency of Communication with Friends and Family: Once a week   Frequency of Social Gatherings with Friends and Family: More than three times a week   Attends Religious Services: Never   Marine scientist or Organizations: Yes   Attends Music therapist: More than 4 times per year   Marital Status: Married    Tobacco Counseling Counseling given: Not Answered   Clinical Intake:  Pre-visit preparation completed: Yes  Pain : 0-10 Pain Score: 3  Pain Type: Chronic pain Pain Location: Back Pain Orientation: Lower Pain Descriptors / Indicators: Burning Pain Onset: More than a month ago Pain Frequency: Intermittent     Nutritional Risks: None Diabetes: Yes CBG done?: No Did pt. bring in CBG monitor from home?: No     Diabetic? Yes  Nutrition Risk Assessment:  Has the patient had any N/V/D within the last 2 months?  No  Does the patient have any non-healing wounds?  No  Has the patient had any unintentional weight loss or weight gain?  No   Diabetes:  Is the patient diabetic?  Yes  If diabetic, was a CBG obtained today?  No  Did the patient bring in their glucometer from home?  No  How often do you monitor your CBG's? 1  time day.   Financial Strains and  Diabetes Management:  Are you having any financial strains with the device, your supplies or your medication? No .  Does the patient  want to be seen by Chronic Care Management for management of their diabetes?  No  Would the patient like to be referred to a Nutritionist or for Diabetic Management?  Yes   Diabetic Exams:  Diabetic Eye Exam: . Overdue for diabetic eye exam. Pt has been advised about the importance in completing this exam. A referral has been placed today. Message sent to referral coordinator for scheduling purposes. Advised pt to expect a call from office referred to regarding appt.  Diabetic Foot Exam: Completed . Pt has been advised about the importance in completing this exam.  Interpreter Needed?: No  Information entered by :: Leroy Kennedy LPN   Activities of Daily Living In your present state of health, do you have any difficulty performing the following activities: 07/30/2021 12/02/2020  Hearing? N N  Vision? N N  Difficulty concentrating or making decisions? N N  Walking or climbing stairs? N Y  Dressing or bathing? N N  Doing errands, shopping? N N  Preparing Food and eating ? N -  Using the Toilet? N -  In the past six months, have you accidently leaked urine? N -  Do you have problems with loss of bowel control? N -  Managing your Medications? N -  Managing your Finances? N -  Housekeeping or managing your Housekeeping? N -  Some recent data might be hidden    Patient Care Team: Tammi Sou, MD as PCP - General (Family Medicine) Zadie Rhine Clent Demark, MD as Consulting Physician (Ophthalmology) Milus Banister, MD as Consulting Physician (Gastroenterology) Mosetta Anis, MD as Consulting Physician (Allergy) Star Age, MD as Consulting Physician (Neurology) Georgia Lopes, DO as Consulting Physician (Bariatrics) Erline Levine, MD as Consulting Physician (Neurosurgery) Cameron Sprang, MD as Consulting Physician (Neurology) Festus Aloe, MD as Consulting Physician (Urology)  Indicate any recent Medical Services you may have received from other than [facility] providers in the past  year (date may be approximate).     Assessment:   This is a routine wellness examination for [facility].  Hearing/Vision screen Hearing Screening - Comments:: No trouble hearing Vision Screening - Comments:: Not up to date  Dr. Deloria Lair Patient will call  Dietary issues and exercise activities discussed: Current Exercise Habits: Home exercise routine;Structured exercise class, Type of exercise: walking, Time (Minutes): 35, Frequency (Times/Week): 5, Weekly Exercise (Minutes/Week): 175, Intensity: Moderate, Exercise limited by: None identified   Goals Addressed             This Visit's Progress    Patient Stated       Maintain current lifestyle        Depression Screen PHQ 2/9 Scores 07/30/2021 05/22/2021 11/17/2019 08/10/2018 02/14/2018 02/04/2018 11/05/2017  PHQ - 2 Score 0 0 0 0 '3 1 1  '$ PHQ- 9 Score - 0 '2 4 14 5 9  '$ Exception Documentation - - - - Medical reason - -    Fall Risk Fall Risk  07/30/2021 08/13/2020 02/05/2020 11/17/2019 05/24/2018  Falls in the past year? 0 0 0 1 No  Number falls in past yr: 0 0 0 1 -  Comment - - - - -  Injury with Fall? 0 0 0 0 -  Follow up Falls evaluation completed;Falls prevention discussed - - Falls evaluation completed -    FALL RISK PREVENTION PERTAINING TO THE HOME:  Any stairs  in or around the home? Yes  If so, are there any without handrails? No  Home free of loose throw rugs in walkways, pet beds, electrical cords, etc? Yes  Adequate lighting in your home to reduce risk of falls? Yes   ASSISTIVE DEVICES UTILIZED TO PREVENT FALLS:  Life alert? No  Use of a cane, walker or w/c? No  Grab bars in the bathroom? No  Shower chair or bench in shower? No  Elevated toilet seat or a handicapped toilet? Yes   TIMED UP AND GO:  Was the test performed? No .   Cognitive Function:  Normal cognitive status assessed by direct observation by this Nurse Health Advisor. No abnormalities found.    MMSE - Mini Mental State Exam 05/24/2018   Orientation to time 5  Orientation to Place 5  Registration 3  Attention/ Calculation 5  Recall 3  Language- name 2 objects 2  Language- repeat 1  Language- follow 3 step command 3  Language- read & follow direction 1  Write a sentence 1  Copy design 0  Total score 29   Montreal Cognitive Assessment  02/14/2020  Visuospatial/ Executive (0/5) 4  Naming (0/3) 3  Attention: Read list of digits (0/2) 2  Attention: Read list of letters (0/1) 1  Attention: Serial 7 subtraction starting at 100 (0/3) 3  Language: Repeat phrase (0/2) 2  Language : Fluency (0/1) 1  Abstraction (0/2) 1  Delayed Recall (0/5) 0  Orientation (0/6) 6  Total 23  Adjusted Score (based on education) 23      Immunizations Immunization History  Administered Date(s) Administered   Fluad Quad(high Dose 65+) 09/12/2019   Influenza Split 10/15/2011, 11/10/2012   Influenza, High Dose Seasonal PF 08/10/2018   Influenza,inj,Quad PF,6+ Mos 11/27/2013, 07/27/2014, 07/31/2016   Influenza-Unspecified 10/25/2017, 10/07/2020   Moderna Sars-Covid-2 Vaccination 01/11/2020, 02/08/2020, 10/07/2020   Pneumococcal Conjugate-13 02/04/2018   Pneumococcal Polysaccharide-23 08/25/2007, 11/18/2012, 11/17/2019   Tdap 10/15/2011   Zoster Recombinat (Shingrix) 10/25/2017, 03/22/2018   Zoster, Live 02/24/2013    TDAP status: Up to date  Flu Vaccine status: Up to date  Pneumococcal vaccine status: Up to date  Covid-19 vaccine status: Information provided on how to obtain vaccines.   Qualifies for Shingles Vaccine? No   Zostavax completed Yes   Shingrix Completed?: Yes  Screening Tests Health Maintenance  Topic Date Due   OPHTHALMOLOGY EXAM  05/15/2020   COVID-19 Vaccine (4 - Booster for Moderna series) 02/04/2021   COLONOSCOPY (Pts 45-40yr Insurance coverage will need to be confirmed)  02/20/2021   URINE MICROALBUMIN  05/16/2021   INFLUENZA VACCINE  06/30/2021   TETANUS/TDAP  10/14/2021   HEMOGLOBIN A1C   10/22/2021   FOOT EXAM  05/22/2022   Hepatitis C Screening  Completed   PNA vac Low Risk Adult  Completed   Zoster Vaccines- Shingrix  Completed   HPV VACCINES  Aged Out    Health Maintenance  Health Maintenance Due  Topic Date Due   OPHTHALMOLOGY EXAM  05/15/2020   COVID-19 Vaccine (4 - Booster for Moderna series) 02/04/2021   COLONOSCOPY (Pts 45-423yrInsurance coverage will need to be confirmed)  02/20/2021   URINE MICROALBUMIN  05/16/2021   INFLUENZA VACCINE  06/30/2021    Colorectal cancer screening: Type of screening: Colonoscopy. Completed  . Repeat every 3 years  Lung Cancer Screening: (Low Dose CT Chest recommended if Age 68-80ears, 30 pack-year currently smoking OR have quit w/in 15years.) does not qualify.   Lung Cancer Screening  Referral:   Additional Screening:  Hepatitis C Screening: does not qualify; Completed 2018  Vision Screening: Recommended annual ophthalmology exams for early detection of glaucoma and other disorders of the eye. Is the patient up to date with their annual eye exam?  No  Who is the provider or what is the name of the office in which the patient attends annual eye exams? Deloria Lair If pt is not established with a provider, would they like to be referred to a provider to establish care? No .   Dental Screening: Recommended annual dental exams for proper oral hygiene  Community Resource Referral / Chronic Care Management: CRR required this visit?  No   CCM required this visit?  No      Plan:     I have personally reviewed and noted the following in the patient's chart:   Medical and social history Use of alcohol, tobacco or illicit drugs  Current medications and supplements including opioid prescriptions. Patient is not currently taking opioid prescriptions. Functional ability and status Nutritional status Physical activity Advanced directives List of other physicians Hospitalizations, surgeries, and ER visits in previous 12  months Vitals Screenings to include cognitive, depression, and falls Referrals and appointments  In addition, I have reviewed and discussed with patient certain preventive protocols, quality metrics, and best practice recommendations. A written personalized care plan for preventive services as well as general preventive health recommendations were provided to patient.     Leroy Kennedy, LPN   [id number]   Nurse Notes: na

## 2021-07-30 NOTE — Patient Instructions (Signed)
[name] [name] , Thank you for taking time to come for your Medicare Wellness Visit. I appreciate your ongoing commitment to your health goals. Please review the following plan we discussed and let me know if I can assist you in the future.   Screening recommendations/referrals: Colonoscopy: Education provided Recommended yearly ophthalmology/optometry visit for glaucoma screening and checkup Recommended yearly dental visit for hygiene and checkup  Vaccinations: Influenza vaccine: up to date Pneumococcal vaccine: up to date Tdap vaccine: up to date Shingles vaccine: up to date    Advanced directives: Education provided  Conditions/risks identified:     Preventive Care 68 Years and Older, Male Preventive care refers to lifestyle choices and visits with your health care provider that can promote health and wellness. What does preventive care include? A yearly physical exam. This is also called an annual well check. Dental exams once or twice a year. Routine eye exams. Ask your health care provider how often you should have your eyes checked. Personal lifestyle choices, including: Daily care of your teeth and gums. Regular physical activity. Eating a healthy diet. Avoiding tobacco and drug use. Limiting alcohol use. Practicing safe sex. Taking low doses of aspirin every day. Taking vitamin and mineral supplements as recommended by your health care provider. What happens during an annual well check? The services and screenings done by your health care provider during your annual well check will depend on your age, overall health, lifestyle risk factors, and family history of disease. Counseling  Your health care provider may ask you questions about your: Alcohol use. Tobacco use. Drug use. Emotional well-being. Home and relationship well-being. Sexual activity. Eating habits. History of falls. Memory and ability to understand (cognition). Work and work  Statistician. Screening  You may have the following tests or measurements: Height, weight, and BMI. Blood pressure. Lipid and cholesterol levels. These may be checked every 5 years, or more frequently if you are over 13 years old. Skin check. Lung cancer screening. You may have this screening every year starting at age 84 if you have a 30-pack-year history of smoking and currently smoke or have quit within the past 15 years. Fecal occult blood test (FOBT) of the stool. You may have this test every year starting at age 11. Flexible sigmoidoscopy or colonoscopy. You may have a sigmoidoscopy every 5 years or a colonoscopy every 10 years starting at age 34. Prostate cancer screening. Recommendations will vary depending on your family history and other risks. Hepatitis C blood test. Hepatitis B blood test. Sexually transmitted disease (STD) testing. Diabetes screening. This is done by checking your blood sugar (glucose) after you have not eaten for a while (fasting). You may have this done every 1-3 years. Abdominal aortic aneurysm (AAA) screening. You may need this if you are a current or former smoker. Osteoporosis. You may be screened starting at age 52 if you are at high risk. Talk with your health care provider about your test results, treatment options, and if necessary, the need for more tests. Vaccines  Your health care provider may recommend certain vaccines, such as: Influenza vaccine. This is recommended every year. Tetanus, diphtheria, and acellular pertussis (Tdap, Td) vaccine. You may need a Td booster every 10 years. Zoster vaccine. You may need this after age 84. Pneumococcal 13-valent conjugate (PCV13) vaccine. One dose is recommended after age 73. Pneumococcal polysaccharide (PPSV23) vaccine. One dose is recommended after age 16. Talk to your health care provider about which screenings and vaccines you need and how often  you need them. This information is not intended to replace  advice given to you by your health care provider. Make sure you discuss any questions you have with your health care provider. Document Released: 12/13/2015 Document Revised: 08/05/2016 Document Reviewed: 09/17/2015 Elsevier Interactive Patient Education  2017 [place] Prevention in the Home Falls can cause injuries. They can happen to people of all ages. There are many things you can do to make your home safe and to help prevent falls. What can I do on the outside of my home? Regularly fix the edges of walkways and driveways and fix any cracks. Remove anything that might make you trip as you walk through a door, such as a raised step or threshold. Trim any bushes or trees on the path to your home. Use bright outdoor lighting. Clear any walking paths of anything that might make someone trip, such as rocks or tools. Regularly check to see if handrails are loose or broken. Make sure that both sides of any steps have handrails. Any raised decks and porches should have guardrails on the edges. Have any leaves, snow, or ice cleared regularly. Use sand or salt on walking paths during winter. Clean up any spills in your garage right away. This includes oil or grease spills. What can I do in the bathroom? Use night lights. Install grab bars by the toilet and in the tub and shower. Do not use towel bars as grab bars. Use non-skid mats or decals in the tub or shower. If you need to sit down in the shower, use a plastic, non-slip stool. Keep the floor dry. Clean up any water that spills on the floor as soon as it happens. Remove soap buildup in the tub or shower regularly. Attach bath mats securely with double-sided non-slip rug tape. Do not have throw rugs and other things on the floor that can make you trip. What can I do in the bedroom? Use night lights. Make sure that you have a light by your bed that is easy to reach. Do not use any sheets or blankets that are too big for your bed.  They should not hang down onto the floor. Have a firm chair that has side arms. You can use this for support while you get dressed. Do not have throw rugs and other things on the floor that can make you trip. What can I do in the kitchen? Clean up any spills right away. Avoid walking on wet floors. Keep items that you use a lot in easy-to-reach places. If you need to reach something above you, use a strong step stool that has a grab bar. Keep electrical cords out of the way. Do not use floor polish or wax that makes floors slippery. If you must use wax, use non-skid floor wax. Do not have throw rugs and other things on the floor that can make you trip. What can I do with my stairs? Do not leave any items on the stairs. Make sure that there are handrails on both sides of the stairs and use them. Fix handrails that are broken or loose. Make sure that handrails are as long as the stairways. Check any carpeting to make sure that it is firmly attached to the stairs. Fix any carpet that is loose or worn. Avoid having throw rugs at the top or bottom of the stairs. If you do have throw rugs, attach them to the floor with carpet tape. Make sure that you have a light  switch at the top of the stairs and the bottom of the stairs. If you do not have them, ask someone to add them for you. What else can I do to help prevent falls? Wear shoes that: Do not have high heels. Have rubber bottoms. Are comfortable and fit you well. Are closed at the toe. Do not wear sandals. If you use a stepladder: Make sure that it is fully opened. Do not climb a closed stepladder. Make sure that both sides of the stepladder are locked into place. Ask someone to hold it for you, if possible. Clearly mark and make sure that you can see: Any grab bars or handrails. First and last steps. Where the edge of each step is. Use tools that help you move around (mobility aids) if they are needed. These  include: Canes. Walkers. Scooters. Crutches. Turn on the lights when you go into a dark area. Replace any light bulbs as soon as they burn out. Set up your furniture so you have a clear path. Avoid moving your furniture around. If any of your floors are uneven, fix them. If there are any pets around you, be aware of where they are. Review your medicines with your doctor. Some medicines can make you feel dizzy. This can increase your chance of falling. Ask your doctor what other things that you can do to help prevent falls. This information is not intended to replace advice given to you by your health care provider. Make sure you discuss any questions you have with your health care provider. Document Released: 09/12/2009 Document Revised: 04/23/2016 Document Reviewed: 12/21/2014 Elsevier Interactive Patient Education  2017 [employer].

## 2021-08-06 ENCOUNTER — Other Ambulatory Visit: Payer: Self-pay | Admitting: Family Medicine

## 2021-08-07 ENCOUNTER — Other Ambulatory Visit (INDEPENDENT_AMBULATORY_CARE_PROVIDER_SITE_OTHER): Payer: Self-pay | Admitting: Physician Assistant

## 2021-08-07 DIAGNOSIS — E119 Type 2 diabetes mellitus without complications: Secondary | ICD-10-CM

## 2021-08-07 DIAGNOSIS — Z794 Long term (current) use of insulin: Secondary | ICD-10-CM

## 2021-08-07 NOTE — Telephone Encounter (Signed)
[name] [name] 

## 2021-08-07 NOTE — Telephone Encounter (Signed)
LAST APPOINTMENT DATE:  NEXT APPOINTMENT DATE:    [facility] [place], [place] [id number] [place] [address] Phone: [phone] Fax: [phone]  Patient is requesting a refill of the following medications: Requested Prescriptions   Pending Prescriptions Disp Refills   OZEMPIC, 2 MG/DOSE, 8 MG/3ML SOPN [Pharmacy Med Name: OZEMPIC '2MG'$  PER DOSE (1X'8MG'$  PEN)] 9 mL 0    Sig: INJECT 2 MG INTO THE SKIN ONCE WEEKLY    Date last filled: 05/26/2021 Previously prescribed by Linus Orn  Lab Results  Component Value Date   HGBA1C 5.9 (H) 04/21/2021   HGBA1C 5.3 12/02/2020   HGBA1C 5.9 (H) 08/29/2020   Lab Results  Component Value Date   MICROALBUR <0.7 05/16/2020   [place] 69 04/21/2021   CREATININE 0.96 04/21/2021   Lab Results  Component Value Date   VD25OH 42.0 04/21/2021   VD25OH 59.0 01/28/2021   VD25OH 54.6 08/29/2020    BP Readings from Last 3 Encounters:  06/30/21 118/70  06/11/21 126/71  05/26/21 121/66

## 2021-08-11 ENCOUNTER — Encounter (INDEPENDENT_AMBULATORY_CARE_PROVIDER_SITE_OTHER): Payer: Self-pay | Admitting: Adult Health

## 2021-08-11 ENCOUNTER — Ambulatory Visit (INDEPENDENT_AMBULATORY_CARE_PROVIDER_SITE_OTHER): Payer: Medicare Other | Admitting: Adult Health

## 2021-08-11 ENCOUNTER — Other Ambulatory Visit: Payer: Self-pay

## 2021-08-11 VITALS — BP 111/69 | HR 88 | Temp 98.3°F | Ht 70.0 in | Wt 225.0 lb

## 2021-08-11 DIAGNOSIS — M533 Sacrococcygeal disorders, not elsewhere classified: Secondary | ICD-10-CM | POA: Diagnosis not present

## 2021-08-11 DIAGNOSIS — Z6833 Body mass index (BMI) 33.0-33.9, adult: Secondary | ICD-10-CM | POA: Diagnosis not present

## 2021-08-11 DIAGNOSIS — E669 Obesity, unspecified: Secondary | ICD-10-CM

## 2021-08-11 DIAGNOSIS — E559 Vitamin D deficiency, unspecified: Secondary | ICD-10-CM | POA: Diagnosis not present

## 2021-08-11 DIAGNOSIS — E1169 Type 2 diabetes mellitus with other specified complication: Secondary | ICD-10-CM

## 2021-08-11 DIAGNOSIS — Z794 Long term (current) use of insulin: Secondary | ICD-10-CM | POA: Diagnosis not present

## 2021-08-11 DIAGNOSIS — R030 Elevated blood-pressure reading, without diagnosis of hypertension: Secondary | ICD-10-CM | POA: Diagnosis not present

## 2021-08-11 DIAGNOSIS — G8929 Other chronic pain: Secondary | ICD-10-CM | POA: Diagnosis not present

## 2021-08-11 MED ORDER — OZEMPIC (2 MG/DOSE) 8 MG/3ML ~~LOC~~ SOPN: 2.0000 mg | PEN_INJECTOR | SUBCUTANEOUS | 0 refills | Status: DC

## 2021-08-11 MED ORDER — BD PEN NEEDLE NANO 2ND GEN 32G X 4 MM MISC: 2 refills | Status: DC

## 2021-08-11 MED ORDER — VITAMIN D (ERGOCALCIFEROL) 1.25 MG (50000 UNIT) PO CAPS: 50000.0000 [IU] | ORAL_CAPSULE | ORAL | 0 refills | Status: DC

## 2021-08-11 NOTE — Progress Notes (Signed)
Chief Complaint:   OBESITY Matas is here to discuss his progress with his obesity treatment plan along with follow-up of his obesity related diagnoses. Gohan is on the Category 4 Plan and states he is following his eating plan approximately 70% of the time. Elston states he is doing PT for 10-15 minutes 3 times per week.  Today's visit was #: 21 Starting weight: 273 lbs Starting date: 02/14/2018 Today's weight: 225 lbs Today's date: 08/11/2021 Total lbs lost to date: 48 lbs Total lbs lost since last in-office visit: 3 lbs  Interim History: Tracy has been performing home PT exercise for lumbar back pain. When eating off plan, "cheating" consists of ice cream, chocolate, and candy.  Subjective:   1. Vitamin D deficiency Vitamin D level on 04/21/2021 was 42.0 - below goal of 50.   He is currently taking prescription ergocalciferol 50,000 IU each week. He denies nausea, vomiting or muscle weakness.  Lab Results  Component Value Date   VD25OH 42.0 04/21/2021   VD25OH 59.0 01/28/2021   VD25OH 54.6 08/29/2020   2. Type 2 diabetes mellitus with other specified complication, with long-term current use of insulin ([place]) He has been on Metformin 1000 mg BID, Levemir 50 units daily, and Ozempic 2 mg once weekly - has been off for 2-3 weeks due to pharmacy refusing to refill the GLP-1 prescription. Fasting BG 80-120 - denies symptoms of hypoglycemia. He reports noticeable increase in polyphagia without the weekly GLP-1 injection.  Lab Results  Component Value Date   HGBA1C 5.9 (H) 04/21/2021   HGBA1C 5.3 12/02/2020   HGBA1C 5.9 (H) 08/29/2020   Lab Results  Component Value Date   MICROALBUR <0.7 05/16/2020   LDLCALC 69 04/21/2021   CREATININE 0.96 04/21/2021   Lab Results  Component Value Date   INSULIN 5.8 08/29/2020   INSULIN 5.3 05/09/2020   Assessment/Plan:   1. Vitamin D deficiency Low Vitamin D level contributes to fatigue and are associated with obesity, breast,  and colon cancer. He agrees to continue to take prescription ergocalciferol '@50'$ ,000 IU every week.  - Refill Vitamin D, Ergocalciferol, (DRISDOL) 1.25 MG (50000 UNIT) CAPS capsule; Take 1 capsule (50,000 Units total) by mouth every 7 (seven) days.  Dispense: 12 capsule; Refill: 0  2. Type 2 diabetes mellitus with other specified complication, with long-term current use of insulin (HCC) Restart Ozempic 2 mg once weekly. Start with 1 mg, then increase to 2 mg once weekly (if tolerating). Refill pen needles.  - Refill Semaglutide, 2 MG/DOSE, (OZEMPIC, 2 MG/DOSE,) 8 MG/3ML SOPN; Inject 2 mg into the skin once a week.  Dispense: 9 mL; Refill: 0 - Refill Insulin Pen Needle (BD PEN NEEDLE NANO 2ND GEN) 32G X 4 MM MISC; USE TO INJECT 4 TIMES DAILY  Dispense: 100 each; Refill: 2  3. Obesity with current BMI 32.4  Tywone is currently in the action stage of change. As such, his goal is to continue with weight loss efforts. He has agreed to the Category 4 Plan.   Exercise goals:  PT - Home/Back  Behavioral modification strategies: increasing lean protein intake, decreasing simple carbohydrates, meal planning and cooking strategies, keeping healthy foods in the home, and planning for success.  Check fasting labs at next office visit.   Corson has agreed to follow-up with our clinic in 4-5 weeks. He was informed of the importance of frequent follow-up visits to maximize his success with intensive lifestyle modifications for his multiple health conditions.   Objective:  Blood pressure 111/69, pulse 88, temperature 98.3 F (36.8 C), height '5\' 10"'$  (1.778 m), weight 225 lb (102.1 kg), SpO2 97 %. Body mass index is 32.28 kg/m.  General: Cooperative, alert, well developed, in no acute distress. HEENT: Conjunctivae and lids unremarkable. Cardiovascular: Regular rhythm.  Lungs: Normal work of breathing. Neurologic: No focal deficits.   Lab Results  Component Value Date   CREATININE 0.96 04/21/2021    BUN 23 04/21/2021   NA 138 04/21/2021   K 4.1 04/21/2021   CL 100 04/21/2021   CO2 24 04/21/2021   Lab Results  Component Value Date   ALT 19 04/21/2021   AST 12 04/21/2021   ALKPHOS 37 (L) 04/21/2021   BILITOT 0.4 04/21/2021   Lab Results  Component Value Date   HGBA1C 5.9 (H) 04/21/2021   HGBA1C 5.3 12/02/2020   HGBA1C 5.9 (H) 08/29/2020   HGBA1C 5.7 (H) 05/09/2020   HGBA1C 5.8 (H) 12/12/2019   Lab Results  Component Value Date   INSULIN 5.8 08/29/2020   INSULIN 5.3 05/09/2020   Lab Results  Component Value Date   TSH 1.25 02/05/2020   Lab Results  Component Value Date   CHOL 134 04/21/2021   HDL 46 04/21/2021   LDLCALC 69 04/21/2021   LDLDIRECT 112.0 07/31/2016   TRIG 101 04/21/2021   CHOLHDL 2.9 04/21/2021   Lab Results  Component Value Date   VD25OH 42.0 04/21/2021   VD25OH 59.0 01/28/2021   VD25OH 54.6 08/29/2020   Lab Results  Component Value Date   WBC 5.8 05/22/2021   HGB 12.4 (L) 05/22/2021   HCT 37.1 (L) 05/22/2021   MCV 85.0 05/22/2021   PLT 284.0 05/22/2021   Obesity Behavioral Intervention:   Approximately 15 minutes were spent on the discussion below.  ASK: We discussed the diagnosis of obesity with [name] today and Shoichi agreed to give [name] permission to discuss obesity behavioral modification therapy today.  ASSESS: Leondre has the diagnosis of obesity and his BMI today is 32.4. Ronon is in the action stage of change.   ADVISE: Nahzir was educated on the multiple health risks of obesity as well as the benefit of weight loss to improve his health. He was advised of the need for long term treatment and the importance of lifestyle modifications to improve his current health and to decrease his risk of future health problems.  AGREE: Multiple dietary modification options and treatment options were discussed and Tahjay agreed to follow the recommendations documented in the above note.  ARRANGE: Dayshon was educated on the importance  of frequent visits to treat obesity as outlined per CMS and USPSTF guidelines and agreed to schedule his next follow up appointment today.  Attestation Statements:   Reviewed by clinician on day of visit: allergies, medications, problem list, medical history, surgical history, family history, social history, and previous encounter notes.  I, Water quality scientist, CMA, am acting as Location manager for Mina Marble, NP.  I have reviewed the above documentation for accuracy and completeness, and I agree with the above. -  Markell Sciascia d. Maiya Kates, NP-C

## 2021-08-18 ENCOUNTER — Telehealth: Payer: Self-pay

## 2021-08-18 NOTE — Telephone Encounter (Signed)
Patient had Medicare Well Visit on 07/30/21.  He was told that he was due for his colonoscopy. Dr. Anitra Lauth entered order; referral order was sent to [facility]. He called today to check status.  I gave patient phone number to reach [facility] to schedule appt.  He called their office, and he was told he was not due for his colonoscopy.  He immediately called our office back, to let me know what they said. He was told to follow up with PCP.  Please verify and follow up with patient.  He can be reached at [phone].

## 2021-08-18 NOTE — Telephone Encounter (Signed)
Pls call [name] and ask for clarification: he had colonoscopy by Dr. Ardis Hughs 2017 and path showed tubular adenoma ("precancerous")--which would make standard repeat colonoscopy 5 yrs later---which is now.  If they still say he does not need rpt colonoscopy yet then ask when they recommend his next one.-thx

## 2021-08-18 NOTE — Telephone Encounter (Signed)
During pt's last colonoscopy on 02/21/16, "If the polyp(s) is proven to be 'pre-cancerous' on pathology, you will need repeat colonoscopy in 3 years. If the polyp(s) is NOT 'precancerous' on pathology then you should repeat colon cancer screening in 10 years with colonoscopy without need for colon cancer screening by any method prior to then (including stool testing)."   Please review and advise, no other results found in chart.

## 2021-08-19 NOTE — Telephone Encounter (Signed)
FYI, Spoke with someone at [facility] and advised due to recent guidelines and recall; changed from march 2022 to 2024, MD reviewed chart and letter was sent to patient with this information. Spoke with pt to advise of this update, he voiced understanding.

## 2021-08-19 NOTE — Telephone Encounter (Signed)
Thank you :)

## 2021-08-25 DIAGNOSIS — H16223 Keratoconjunctivitis sicca, not specified as Sjogren's, bilateral: Secondary | ICD-10-CM | POA: Diagnosis not present

## 2021-08-25 DIAGNOSIS — H11153 Pinguecula, bilateral: Secondary | ICD-10-CM | POA: Diagnosis not present

## 2021-08-25 DIAGNOSIS — H18612 Keratoconus, stable, left eye: Secondary | ICD-10-CM | POA: Diagnosis not present

## 2021-08-25 DIAGNOSIS — H2511 Age-related nuclear cataract, right eye: Secondary | ICD-10-CM | POA: Diagnosis not present

## 2021-08-25 DIAGNOSIS — Z947 Corneal transplant status: Secondary | ICD-10-CM | POA: Diagnosis not present

## 2021-08-25 DIAGNOSIS — H18413 Arcus senilis, bilateral: Secondary | ICD-10-CM | POA: Diagnosis not present

## 2021-08-25 DIAGNOSIS — H18611 Keratoconus, stable, right eye: Secondary | ICD-10-CM | POA: Diagnosis not present

## 2021-08-25 DIAGNOSIS — Z961 Presence of intraocular lens: Secondary | ICD-10-CM | POA: Diagnosis not present

## 2021-08-26 ENCOUNTER — Other Ambulatory Visit: Payer: Self-pay

## 2021-08-26 ENCOUNTER — Ambulatory Visit (INDEPENDENT_AMBULATORY_CARE_PROVIDER_SITE_OTHER): Payer: Medicare Other | Admitting: Ophthalmology

## 2021-08-26 ENCOUNTER — Encounter (INDEPENDENT_AMBULATORY_CARE_PROVIDER_SITE_OTHER): Payer: Self-pay | Admitting: Ophthalmology

## 2021-08-26 DIAGNOSIS — Z9889 Other specified postprocedural states: Secondary | ICD-10-CM | POA: Diagnosis not present

## 2021-08-26 DIAGNOSIS — E1165 Type 2 diabetes mellitus with hyperglycemia: Secondary | ICD-10-CM | POA: Diagnosis not present

## 2021-08-26 DIAGNOSIS — Z794 Long term (current) use of insulin: Secondary | ICD-10-CM

## 2021-08-26 DIAGNOSIS — H35352 Cystoid macular degeneration, left eye: Secondary | ICD-10-CM | POA: Insufficient documentation

## 2021-08-26 DIAGNOSIS — E113291 Type 2 diabetes mellitus with mild nonproliferative diabetic retinopathy without macular edema, right eye: Secondary | ICD-10-CM | POA: Diagnosis not present

## 2021-08-26 DIAGNOSIS — Z961 Presence of intraocular lens: Secondary | ICD-10-CM

## 2021-08-26 DIAGNOSIS — H2511 Age-related nuclear cataract, right eye: Secondary | ICD-10-CM | POA: Insufficient documentation

## 2021-08-26 DIAGNOSIS — H18612 Keratoconus, stable, left eye: Secondary | ICD-10-CM

## 2021-08-26 DIAGNOSIS — E113292 Type 2 diabetes mellitus with mild nonproliferative diabetic retinopathy without macular edema, left eye: Secondary | ICD-10-CM

## 2021-08-26 NOTE — Progress Notes (Signed)
08/26/2021     CHIEF COMPLAINT Patient presents for  Chief Complaint  Patient presents with   Retina Follow Up      HISTORY OF PRESENT ILLNESS: [name] [name] is a 70 y.o. male who presents to the clinic today for:   HPI     Retina Follow Up   Patient presents with  Other.  In both eyes.  This started 1.5 years ago.  Severity is mild.  Duration of 1.5 years.  Since onset it is stable.        Comments   Pt here for fu ou and OCT Pt states, "My vision seems to be ok. I am doing ok. My eyes water a lot but that is about it." Pt denies any new FOL, floaters or issues at all. LBS: 104       Last edited by Kendra Opitz, COA on 08/26/2021  9:50 AM.      Referring physician: Tammi Sou, MD [address] [place],  [place] [address]  HISTORICAL INFORMATION:   Selected notes from the MEDICAL RECORD NUMBER    Lab Results  Component Value Date   HGBA1C 5.9 (H) 04/21/2021     CURRENT MEDICATIONS: No current outpatient medications on file. (Ophthalmic Drugs)   No current facility-administered medications for this visit. (Ophthalmic Drugs)   Current Outpatient Medications (Other)  Medication Sig   aspirin 81 MG EC tablet Take 1 tablet (81 mg total) by mouth daily.   atorvastatin (LIPITOR) 80 MG tablet TAKE 1 TABLET(80 MG) BY MOUTH DAILY   azelastine (ASTELIN) 0.1 % nasal spray Place 2 sprays into both nostrils 2 (two) times daily. Use in each nostril as directed (Patient taking differently: Place 2 sprays into both nostrils 2 (two) times daily. Use in each nostril as needed)   buPROPion (WELLBUTRIN SR) 150 MG 12 hr tablet Take 1 tablet (150 mg total) by mouth 2 (two) times daily.   Cholecalciferol (VITAMIN D3) 125 MCG (5000 UT) CAPS Take 1 capsule (5,000 Units total) by mouth daily.   Choline Fenofibrate (FENOFIBRIC ACID) 135 MG CPDR TAKE 1 CAPSULE BY MOUTH DAILY   escitalopram (LEXAPRO) 20 MG tablet TAKE 1 TABLET(20 MG) BY MOUTH DAILY   finasteride  (PROSCAR) 5 MG tablet TAKE 1 TABLET(5 MG) BY MOUTH DAILY   fluticasone (FLONASE) 50 MCG/ACT nasal spray Place 2 sprays into both nostrils daily. (Patient taking differently: Place 2 sprays into both nostrils daily as needed for allergies.)   gabapentin (NEURONTIN) 300 MG capsule Take 900 mg by mouth at bedtime.   glucose blood (ONE TOUCH ULTRA TEST) test strip 1 each by Other route 3 (three) times daily. Test sugars up to three times daily. ( insurance preference  DX E11.65 E11.9)   glucose blood test strip Per Insurance Preference- Test sugars up to three times daily DX E11.9   HYDROcodone-acetaminophen (NORCO/VICODIN) 5-325 MG tablet Take 1 tablet by mouth every 6 (six) hours as needed.   insulin detemir (LEVEMIR FLEXTOUCH) 100 UNIT/ML FlexPen Inject 50 Units into the skin every evening.   Insulin Pen Needle (BD PEN NEEDLE NANO 2ND GEN) 32G X 4 MM MISC USE TO INJECT 4 TIMES DAILY   Krill Oil 500 MG CAPS Take 500 mg by mouth daily.   metFORMIN (GLUCOPHAGE) 1000 MG tablet TAKE 1 TABLET BY MOUTH TWICE DAILY WITH A MEAL   methocarbamol (ROBAXIN) 500 MG tablet Take by mouth as needed.   Semaglutide, 2 MG/DOSE, (OZEMPIC, 2 MG/DOSE,)  8 MG/3ML SOPN Inject 2 mg into the skin once a week.   sodium chloride (OCEAN) 0.65 % nasal spray Place 1 spray into the nose daily as needed.   tamsulosin (FLOMAX) 0.4 MG CAPS capsule TAKE 2 CAPSULES(0.8 MG) BY MOUTH AT BEDTIME   vitamin B-12 (CYANOCOBALAMIN) 1000 MCG tablet Take 2,000 mcg by mouth daily.   Vitamin D, Ergocalciferol, (DRISDOL) 1.25 MG (50000 UNIT) CAPS capsule Take 1 capsule (50,000 Units total) by mouth every 7 (seven) days.   No current facility-administered medications for this visit. (Other)      REVIEW OF SYSTEMS:    ALLERGIES Allergies  Allergen Reactions   Simvastatin Other (See Comments)    myalgias   Antihistamines, Diphenhydramine-Type Other (See Comments)    depression   Codeine Nausea Only   Penicillins Hives   Sulfa  Antibiotics Hives    PAST MEDICAL HISTORY Past Medical History:  Diagnosis Date   Allergic rhinitis, unspecified    Allergy testing via allergist 04/2017--mostly neg.  Dr. Donneta Romberg recommended referral to ENT so i did this.   Aortic root dilatation ([place]) 2019   2019, 41 mm, 2022, 39 mm-->rpt 1 yr.   Back pain    LBP->DDD/spondylosis w/ intermittent radiulopathy-type leg pains, + bilat hip and thigh pain-->spinal stenosis L4-5, L5-S1->Dr. Vertell Limber to do decomp and fusion surgery, scheduled for 12/03/20   BPH (benign prostatic hypertrophy)    no hydro on renal u/s 05/2020   Chronic nasal congestion    DDD (degenerative disc disease), lumbar 2010   laminectomy 01/2009.  MR rpt 05/2019->advanced DDD/spondylosis L3-S1, with mod/sev L4-5 spinal stenosis-->plan for surg per Dr. Vertell Limber as of 09/2020   Depression    Hospitalized for suicidal ideation 1992.  Has been on lexapro since 10/2008.   Diabetes mellitus type II    Dx'd approximately 06/2008.  No retinopathy as of 02/2017 ophth.   Erectile dysfunction    Heart murmur, systolic 03/3975   mild intensity, asymptomatic; echo 01/2018 valves fine->obs   History of colon polyps 01/2016   Recall 3 yrs   History of pneumonia 2008   Hospitalized   History of scarlet fever    age 29   Hyperlipidemia, mixed 1993   myalgias on pravastatin   Hypertension 2009   "marked chronic cardiomegaly" on CXR 01/2009 per old records.   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 Delice Lesch   MRI brain 03/01/20 NORMAL   OSA on CPAP 2004; 2019   Back on CPAP 04/2018.  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  02/21/16: tubular adenoma.  Recall 3 yrs (Dr. Ardis Hughs).   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  Vertell Limber)   TONSILLECTOMY AND ADENOIDECTOMY     age 8   TRANSTHORACIC ECHOCARDIOGRAM  02/22/2018   EF 55-60%, grd I DD. Aortic root dilation 41 mm.  05/2021 echo unchanged except aortic root dilation down to 39 mm. Plan rpt 1 yr to monitor aortic root dilation.   URETHRAL DILATION     2 as a child, one as an adult   VASECTOMY  1994   VITRECTOMY      FAMILY HISTORY Family History  Problem Relation Age of Onset   Heart disease Mother    Hyperlipidemia Mother    Stroke Mother    Diabetes Mother    Depression Mother    Alzheimer's disease Mother    Stroke Father    Hyperlipidemia Father    Heart disease Father    Diabetes Father    Hypertension Father    Obesity Father    Alcohol abuse Sister    Depression Sister     SOCIAL HISTORY Social History   Tobacco Use   Smoking status: Former   Smokeless tobacco: Never  Scientific laboratory technician Use: Never used  Substance Use Topics   Alcohol use: Yes    Comment: social, 3-4 drinks month   Drug use: No         OPHTHALMIC EXAM:  Base Eye Exam     Visual Acuity (ETDRS)       Right Left   Dist [place] 20/60 CF at 3'   Dist ph [place] 20/25 -1 20/400         Pupils       Pupils Dark Light Shape React APD   Right PERRL 3 2 Round Slow None   Left PERRL 2 2 Round Minimal None         Visual Fields (Counting fingers)       Left Right    Full Full         Extraocular Movement       Right Left    Full Full         Neuro/Psych     Oriented x3: Yes         Dilation     Both eyes: 1.0% Mydriacyl, 2.5% Phenylephrine  @ 9:52 AM           Slit Lamp and Fundus Exam     External Exam       Right Left   External Normal Normal         Slit Lamp Exam       Right Left   Lids/Lashes Normal Normal   Conjunctiva/Sclera White and quiet White and quiet   Cornea Clear P KP graft The conus with full-thickness stromal scarring in the visual axis   Anterior Chamber Deep and quiet Deep and quiet   Iris Round and reactive Round and reactive   Lens 2+ Nuclear sclerosis Centered posterior chamber intraocular lens   Anterior Vitreous Normal Normal         Fundus Exam       Right Left   Posterior Vitreous Normal Clear avitric   Disc Normal Normal   C/D Ratio 0.15 0.15   Macula Normal Normal   Vessels Normal Normal   Periphery Normal Normal            IMAGING AND PROCEDURES  Imaging and Procedures for 08/28/21  OCT, Retina - OU - Both Eyes       Right Eye Quality was good. Scan locations included  subfoveal. Central Foveal Thickness: 229. Findings include normal foveal contour.   Left Eye Quality was borderline. Scan locations included subfoveal. Central Foveal Thickness: 439. Findings include abnormal foveal contour, epiretinal membrane.   Notes OS, with macular thickening             ASSESSMENT/PLAN:  History of vitrectomy Stable condition OS vision limited by corneal scarring  Type 2 diabetes mellitus with hyperglycemia, with long-term current use of insulin (HCC) No detectable diabetic retinopathy  Nuclear sclerotic cataract of right eye Observe for now recommended and agreed  Pseudophakia of left eye Stable OS  Keratoconus, stable condition, left Follow-up with Dr. 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 Cystoid macular edema of left eye  H35.352 OCT, Retina - OU - Both Eyes    2. Nonproliferative diabetic retinopathy of right eye ([place])  E11.3291 OCT, Retina - OU -  Both Eyes    3. Nonproliferative diabetic retinopathy of left eye (HCC)  F16.3846 OCT, Retina - OU - Both Eyes    4. History of vitrectomy  Z98.890     5. Type 2 diabetes mellitus with hyperglycemia, with long-term current use of insulin (HCC)  E11.65    Z79.4     6. Nuclear sclerotic cataract of right eye  H25.11     7. Pseudophakia of left eye  Z96.1     8. Keratoconus, stable condition, left  H18.612       1.  OU look great, no detectable diabetic retinopathy  2.  OS with a history of vitrectomy membrane peel for very severe epiretinal membrane performed December 2017, overall macular thickness is improved.  Acuity limited still by central visual axis scarring from keratoconus  3.  OD with moderate cataract follow-up with [facility]  Ophthalmic Meds Ordered this visit:  No orders of the defined types were placed in this encounter.      Return in about 2 years (around 08/27/2023) for DILATE OU, OCT, COLOR FP.  There are no Patient Instructions on file for this visit.   Explained the diagnoses, plan, and follow up with the patient and they expressed understanding.  Patient expressed understanding of the importance of proper follow up care.   Clent Demark Kaspian Muccio M.D. Diseases & Surgery of the Retina and Vitreous [facility]ina [facility] Diabetic [place] 08/28/21     Abbreviations: M myopia (nearsighted); A astigmatism; H hyperopia (farsighted); P presbyopia; Mrx spectacle prescription;  CTL contact lenses; OD right eye; OS left eye; OU both eyes  XT exotropia; ET esotropia; PEK punctate epithelial keratitis; PEE punctate epithelial erosions; DES dry eye syndrome; MGD meibomian gland dysfunction; ATs artificial tears; PFAT's preservative free artificial tears; [place] nuclear sclerotic cataract; PSC posterior subcapsular cataract; ERM epi-retinal membrane; PVD posterior vitreous detachment; RD retinal detachment; DM diabetes mellitus; DR diabetic retinopathy; NPDR non-proliferative diabetic  retinopathy; PDR proliferative diabetic retinopathy; CSME clinically significant macular edema; DME diabetic macular edema; dbh dot blot hemorrhages; CWS cotton wool spot; POAG primary open angle glaucoma; C/D cup-to-disc ratio; HVF humphrey visual field; GVF goldmann visual field; OCT optical coherence tomography; IOP intraocular pressure; BRVO Branch retinal vein occlusion; CRVO central retinal vein occlusion; CRAO central retinal artery occlusion; BRAO branch retinal artery occlusion; RT retinal tear; SB scleral buckle; PPV pars plana vitrectomy; VH Vitreous hemorrhage; PRP panretinal laser photocoagulation; IVK intravitreal kenalog; VMT vitreomacular traction; MH Macular hole;  NVD neovascularization  of the disc; NVE neovascularization elsewhere; AREDS age related eye disease study; ARMD age related macular degeneration; POAG primary open angle glaucoma; EBMD epithelial/anterior basement membrane dystrophy; ACIOL anterior chamber intraocular lens; IOL intraocular lens; PCIOL posterior chamber intraocular lens; Phaco/IOL phacoemulsification with intraocular lens placement; [place] photorefractive keratectomy; LASIK laser assisted in situ keratomileusis; HTN hypertension; DM diabetes mellitus; COPD chronic obstructive pulmonary disease

## 2021-08-26 NOTE — Assessment & Plan Note (Signed)
No detectable diabetic retinopathy 

## 2021-08-26 NOTE — Assessment & Plan Note (Signed)
Observe for now recommended and agreed

## 2021-08-26 NOTE — Assessment & Plan Note (Signed)
Stable condition OS vision limited by corneal scarring

## 2021-08-26 NOTE — Assessment & Plan Note (Signed)
Follow-up with Dr. Annia Belt as scheduled

## 2021-08-26 NOTE — Assessment & Plan Note (Signed)
Stable OS 

## 2021-08-28 DIAGNOSIS — N401 Enlarged prostate with lower urinary tract symptoms: Secondary | ICD-10-CM | POA: Diagnosis not present

## 2021-08-28 DIAGNOSIS — N5201 Erectile dysfunction due to arterial insufficiency: Secondary | ICD-10-CM | POA: Diagnosis not present

## 2021-08-28 DIAGNOSIS — R350 Frequency of micturition: Secondary | ICD-10-CM | POA: Diagnosis not present

## 2021-08-28 NOTE — Assessment & Plan Note (Signed)
Chronic stable finding in eye with scarred keratoconus

## 2021-09-01 DIAGNOSIS — M533 Sacrococcygeal disorders, not elsewhere classified: Secondary | ICD-10-CM | POA: Diagnosis not present

## 2021-09-12 DIAGNOSIS — Z23 Encounter for immunization: Secondary | ICD-10-CM | POA: Diagnosis not present

## 2021-09-15 ENCOUNTER — Other Ambulatory Visit: Payer: Self-pay

## 2021-09-15 ENCOUNTER — Encounter (INDEPENDENT_AMBULATORY_CARE_PROVIDER_SITE_OTHER): Payer: Self-pay | Admitting: Adult Health

## 2021-09-15 ENCOUNTER — Ambulatory Visit (INDEPENDENT_AMBULATORY_CARE_PROVIDER_SITE_OTHER): Payer: Medicare Other | Admitting: Adult Health

## 2021-09-15 VITALS — BP 118/67 | HR 78 | Temp 98.0°F | Ht 70.0 in | Wt 227.0 lb

## 2021-09-15 DIAGNOSIS — F3289 Other specified depressive episodes: Secondary | ICD-10-CM

## 2021-09-15 DIAGNOSIS — Z6832 Body mass index (BMI) 32.0-32.9, adult: Secondary | ICD-10-CM

## 2021-09-15 DIAGNOSIS — Z794 Long term (current) use of insulin: Secondary | ICD-10-CM

## 2021-09-15 DIAGNOSIS — E669 Obesity, unspecified: Secondary | ICD-10-CM | POA: Diagnosis not present

## 2021-09-15 DIAGNOSIS — E559 Vitamin D deficiency, unspecified: Secondary | ICD-10-CM

## 2021-09-15 DIAGNOSIS — E1169 Type 2 diabetes mellitus with other specified complication: Secondary | ICD-10-CM

## 2021-09-15 LAB — HEMOGLOBIN A1C: Hemoglobin A1C: 6 %

## 2021-09-15 MED ORDER — ESCITALOPRAM OXALATE 20 MG PO TABS: ORAL_TABLET | ORAL | 0 refills | Status: DC

## 2021-09-15 MED ORDER — VITAMIN D (ERGOCALCIFEROL) 1.25 MG (50000 UNIT) PO CAPS: 50000.0000 [IU] | ORAL_CAPSULE | ORAL | 0 refills | Status: DC

## 2021-09-15 MED ORDER — OZEMPIC (2 MG/DOSE) 8 MG/3ML ~~LOC~~ SOPN: 2.0000 mg | PEN_INJECTOR | SUBCUTANEOUS | 0 refills | Status: DC

## 2021-09-15 MED ORDER — BUPROPION HCL ER (SR) 150 MG PO TB12: 150.0000 mg | ORAL_TABLET | Freq: Two times a day (BID) | ORAL | 0 refills | Status: DC

## 2021-09-15 NOTE — Progress Notes (Addendum)
Chief Complaint:   OBESITY [name] [name] is here to discuss his progress with his obesity treatment plan along with follow-up of his obesity related diagnoses. [name] [name] is on the Category 4 Plan and states he is following his eating plan approximately 50% of the time. [name] [name] states he is doing aerobics 60 minutes 3-4 times per week.  Today's visit was #: 4 Starting weight: 273 lbs Starting date: 02/14/2018 Today's weight: 227 lbs Today's date: 09/15/2021 Total lbs lost to date: 46 Total lbs lost since last in-office visit: 0  Interim History: [name] [name] reports late evening cravings of chips and cookies.  When he eats out for dinner, he feels that he will significantly relax compliance on eating plan.  Subjective:   1. Type 2 diabetes mellitus with other specified complication, with long-term current use of insulin ([place]) 04/21/2021 A1c 5.9- at goal. Pt's ambulatory fasting BG runs 80-100's. He is on Metformin 1000 mg BID, Levemir 50 U, and Ozempic 2 mg once weekly. He denies mass in neck, dysphagia, dyspepsia, or persistent hoarseness. He reports late evening cravings of chips and cookies.   2. Vitamin D deficiency 04/21/2021 Vit D level was 42.0 - below goal of 50. He is currently taking OTC vitamin D 5,000 IU each day and Ergocalciferol 50,000 IU once weekly. He denies nausea, vomiting or muscle weakness.   3. Other depression, with emotional eating  BP/HR excellent at OV. [name] [name] is on Wellbutrin SR 150 mg BID.  Assessment/Plan:   1. Type 2 diabetes mellitus with other specified complication, with long-term current use of insulin (HCC) Good blood sugar control is important to decrease the likelihood of diabetic complications such as nephropathy, neuropathy, limb loss, blindness, coronary artery disease, and death. Intensive lifestyle modification including diet, exercise and weight loss are the first line of treatment for diabetes.  Check labs today.  Refill- Semaglutide, 2 MG/DOSE,  (OZEMPIC, 2 MG/DOSE,) 8 MG/3ML SOPN; Inject 2 mg into the skin once a week.  Dispense: 9 mL; Refill: 0  - Comprehensive metabolic panel - Hemoglobin A1c - Insulin, random  2. Vitamin D deficiency Low Vitamin D level contributes to fatigue and are associated with obesity, breast, and colon cancer. He agrees to continue to take OTC Vit D 5,000 IU daily and Vitamin D 50,000 IU every week and will follow-up for routine testing of Vitamin D, at least 2-3 times per year to avoid over-replacement. Check labs today.  - VITAMIN D 25 Hydroxy (Vit-D Deficiency, Fractures)  Refill- Vitamin D, Ergocalciferol, (DRISDOL) 1.25 MG (50000 UNIT) CAPS capsule; Take 1 capsule (50,000 Units total) by mouth every 7 (seven) days.  Dispense: 12 capsule; Refill: 0  3. Other depression, with emotional eating  Behavior modification techniques were discussed today to help [name] [name] deal with his emotional/non-hunger eating behaviors.  Orders and follow up as documented in patient record.   Refill- buPROPion (WELLBUTRIN SR) 150 MG 12 hr tablet; Take 1 tablet (150 mg total) by mouth 2 (two) times daily.  Dispense: 180 tablet; Refill: 0 Refill- escitalopram (LEXAPRO) 20 MG tablet; TAKE 1 TABLET(20 MG) BY MOUTH DAILY  Dispense: 90 tablet; Refill: 0  4. Class 1 obesity with serious comorbidity and body mass index (BMI) of 32.0 to 32.9 in adult, unspecified obesity type  [name] [name] is currently in the action stage of change. As such, his goal is to continue with weight loss efforts. He has agreed to the Category 4 Plan.   Exercise goals:  As is  Behavioral modification strategies: increasing  lean protein intake, decreasing simple carbohydrates, meal planning and cooking strategies, keeping healthy foods in the home, better snacking choices, avoiding temptations, and planning for success.  [name] [name] has agreed to follow-up with our clinic in 4 weeks. He was informed of the importance of frequent follow-up visits to maximize his  success with intensive lifestyle modifications for his multiple health conditions.   Objective:   Blood pressure 118/67, pulse 78, temperature 98 F (36.7 C), height 5\' 10"  (1.778 m), weight 227 lb (103 kg), SpO2 96 %. Body mass index is 32.57 kg/m.  General: Cooperative, alert, well developed, in no acute distress. HEENT: Conjunctivae and lids unremarkable. Cardiovascular: Regular rhythm.  Lungs: Normal work of breathing. Neurologic: No focal deficits.   Lab Results  Component Value Date   CREATININE 0.96 04/21/2021   BUN 23 04/21/2021   NA 138 04/21/2021   K 4.1 04/21/2021   CL 100 04/21/2021   CO2 24 04/21/2021   Lab Results  Component Value Date   ALT 19 04/21/2021   AST 12 04/21/2021   ALKPHOS 37 (L) 04/21/2021   BILITOT 0.4 04/21/2021   Lab Results  Component Value Date   HGBA1C 5.9 (H) 04/21/2021   HGBA1C 5.3 12/02/2020   HGBA1C 5.9 (H) 08/29/2020   HGBA1C 5.7 (H) 05/09/2020   HGBA1C 5.8 (H) 12/12/2019   Lab Results  Component Value Date   INSULIN 5.8 08/29/2020   INSULIN 5.3 05/09/2020   Lab Results  Component Value Date   TSH 1.25 02/05/2020   Lab Results  Component Value Date   CHOL 134 04/21/2021   HDL 46 04/21/2021   LDLCALC 69 04/21/2021   LDLDIRECT 112.0 07/31/2016   TRIG 101 04/21/2021   CHOLHDL 2.9 04/21/2021   Lab Results  Component Value Date   VD25OH 42.0 04/21/2021   VD25OH 59.0 01/28/2021   VD25OH 54.6 08/29/2020   Lab Results  Component Value Date   WBC 5.8 05/22/2021   HGB 12.4 (L) 05/22/2021   HCT 37.1 (L) 05/22/2021   MCV 85.0 05/22/2021   PLT 284.0 05/22/2021   No results found for: IRON, TIBC, FERRITIN  Obesity Behavioral Intervention:   Approximately 15 minutes were spent on the discussion below.  ASK: We discussed the diagnosis of obesity with [name] [name] today and [name] [name] agreed to give [name] permission to discuss obesity behavioral modification therapy today.  ASSESS: [name] [name] has the diagnosis of obesity and his BMI  today is 32.6. [name] [name] is in the action stage of change.   ADVISE: [name] [name] was educated on the multiple health risks of obesity as well as the benefit of weight loss to improve his health. He was advised of the need for long term treatment and the importance of lifestyle modifications to improve his current health and to decrease his risk of future health problems.  AGREE: Multiple dietary modification options and treatment options were discussed and Axtyn agreed to follow the recommendations documented in the above note.  ARRANGE: Valentino was educated on the importance of frequent visits to treat obesity as outlined per CMS and USPSTF guidelines and agreed to schedule his next follow up appointment today.  Attestation Statements:   Reviewed by clinician on day of visit: allergies, medications, problem list, medical history, surgical history, family history, social history, and previous encounter notes.  Coral Ceo, CMA, am acting as transcriptionist for Mina Marble, NP.  I have reviewed the above documentation for accuracy and completeness, and I agree with the above. -  Halim Surrette d. Adryen Cookson, NP-C

## 2021-09-16 ENCOUNTER — Telehealth (INDEPENDENT_AMBULATORY_CARE_PROVIDER_SITE_OTHER): Payer: Self-pay

## 2021-09-16 ENCOUNTER — Other Ambulatory Visit (INDEPENDENT_AMBULATORY_CARE_PROVIDER_SITE_OTHER): Payer: Self-pay | Admitting: Adult Health

## 2021-09-16 LAB — COMPREHENSIVE METABOLIC PANEL
ALT: 16 IU/L (ref 0–44)
AST: 11 IU/L (ref 0–40)
Albumin/Globulin Ratio: 2 (ref 1.2–2.2)
Albumin: 4.5 g/dL (ref 3.8–4.8)
Alkaline Phosphatase: 43 IU/L — ABNORMAL LOW (ref 44–121)
BUN/Creatinine Ratio: 17 (ref 10–24)
BUN: 18 mg/dL (ref 8–27)
Bilirubin Total: 0.3 mg/dL (ref 0.0–1.2)
CO2: 25 mmol/L (ref 20–29)
Calcium: 9.8 mg/dL (ref 8.6–10.2)
Chloride: 103 mmol/L (ref 96–106)
Creatinine, Ser: 1.03 mg/dL (ref 0.76–1.27)
Globulin, Total: 2.3 g/dL (ref 1.5–4.5)
Glucose: 78 mg/dL (ref 70–99)
Potassium: 3.9 mmol/L (ref 3.5–5.2)
Sodium: 141 mmol/L (ref 134–144)
Total Protein: 6.8 g/dL (ref 6.0–8.5)
eGFR: 79 mL/min/{1.73_m2} (ref >59)

## 2021-09-16 LAB — VITAMIN D 25 HYDROXY (VIT D DEFICIENCY, FRACTURES): Vit D, 25-Hydroxy: 98.8 ng/mL (ref 30.0–100.0)

## 2021-09-16 LAB — HEMOGLOBIN A1C
Est. average glucose Bld gHb Est-mCnc: 126 mg/dL
Hgb A1c MFr Bld: 6 % — ABNORMAL HIGH (ref 4.8–5.6)

## 2021-09-16 LAB — INSULIN, RANDOM: INSULIN: 3.2 u[IU]/mL (ref 2.6–24.9)

## 2021-09-16 NOTE — Telephone Encounter (Signed)
Attempted to reach pt by phone without success. LMOM for pt to return call. MyChart message also sent to pt advising him to stop Vitamin D supplements to include over the counter vitamins. Plan to recheck level in 2 months.  3:58pm-Spoke with pt. Pt notified Vit. D level is at goal. Pt instructed stop Vitamin D supplements to include over the counter vitamins. Plan to recheck level in 2 months. Pt verbalized understanding.  Zarai Orsborn M.LPN

## 2021-09-16 NOTE — Telephone Encounter (Signed)
-----   Message from Esaw Grandchild, NP sent at 09/16/2021  8:13 AM EDT ----- [name] Balo Morning [name] [name], Can you please call [name] and share that his Vit D level is at goal-98. STOP Ergocalciferol, also do not take any other Vit D supplements. Will recheck level in 2 months. Sincerely, Valetta Fuller  ----- Message ----- From: Lavone Neri Lab Results In Sent: 09/16/2021   7:38 AM EDT To: Esaw Grandchild, NP

## 2021-09-25 ENCOUNTER — Other Ambulatory Visit: Payer: Self-pay

## 2021-09-25 ENCOUNTER — Emergency Department (HOSPITAL_COMMUNITY)
Admission: EM | Admit: 2021-09-25 | Discharge: 2021-09-25 | Disposition: A | Discharge status: Home / Self Care | Payer: Medicare Other | Attending: Emergency Medicine | Admitting: Emergency Medicine

## 2021-09-25 ENCOUNTER — Encounter (HOSPITAL_COMMUNITY): Payer: Self-pay

## 2021-09-25 ENCOUNTER — Ambulatory Visit: Payer: Medicare Other | Admitting: Family Medicine

## 2021-09-25 ENCOUNTER — Emergency Department (HOSPITAL_COMMUNITY): Payer: Medicare Other

## 2021-09-25 DIAGNOSIS — N219 Calculus of lower urinary tract, unspecified: Secondary | ICD-10-CM | POA: Insufficient documentation

## 2021-09-25 DIAGNOSIS — E1169 Type 2 diabetes mellitus with other specified complication: Secondary | ICD-10-CM | POA: Insufficient documentation

## 2021-09-25 DIAGNOSIS — N132 Hydronephrosis with renal and ureteral calculous obstruction: Secondary | ICD-10-CM | POA: Insufficient documentation

## 2021-09-25 DIAGNOSIS — Z7982 Long term (current) use of aspirin: Secondary | ICD-10-CM | POA: Diagnosis not present

## 2021-09-25 DIAGNOSIS — K4020 Bilateral inguinal hernia, without obstruction or gangrene, not specified as recurrent: Secondary | ICD-10-CM | POA: Diagnosis not present

## 2021-09-25 DIAGNOSIS — Z87891 Personal history of nicotine dependence: Secondary | ICD-10-CM | POA: Diagnosis not present

## 2021-09-25 DIAGNOSIS — K5730 Diverticulosis of large intestine without perforation or abscess without bleeding: Secondary | ICD-10-CM | POA: Diagnosis not present

## 2021-09-25 DIAGNOSIS — K8020 Calculus of gallbladder without cholecystitis without obstruction: Secondary | ICD-10-CM | POA: Diagnosis not present

## 2021-09-25 DIAGNOSIS — Z7984 Long term (current) use of oral hypoglycemic drugs: Secondary | ICD-10-CM | POA: Insufficient documentation

## 2021-09-25 DIAGNOSIS — I10 Essential (primary) hypertension: Secondary | ICD-10-CM | POA: Diagnosis not present

## 2021-09-25 DIAGNOSIS — Z794 Long term (current) use of insulin: Secondary | ICD-10-CM | POA: Diagnosis not present

## 2021-09-25 DIAGNOSIS — N201 Calculus of ureter: Secondary | ICD-10-CM | POA: Diagnosis not present

## 2021-09-25 DIAGNOSIS — R1031 Right lower quadrant pain: Secondary | ICD-10-CM | POA: Diagnosis present

## 2021-09-25 LAB — URINALYSIS, ROUTINE W REFLEX MICROSCOPIC
Bacteria, UA: NONE SEEN
Bilirubin Urine: NEGATIVE
Glucose, UA: NEGATIVE mg/dL
Ketones, ur: NEGATIVE mg/dL
Leukocytes,Ua: NEGATIVE
Nitrite: NEGATIVE
Protein, ur: 100 mg/dL — AB
RBC / HPF: >50 RBC/hpf — ABNORMAL HIGH (ref 0–5)
Specific Gravity, Urine: 1.013 (ref 1.005–1.030)
WBC, UA: >50 WBC/hpf — ABNORMAL HIGH (ref 0–5)
pH: 6 (ref 5.0–8.0)

## 2021-09-25 LAB — CBC
HCT: 38.2 % — ABNORMAL LOW (ref 39.0–52.0)
Hemoglobin: 12.9 g/dL — ABNORMAL LOW (ref 13.0–17.0)
MCH: 30.1 pg (ref 26.0–34.0)
MCHC: 33.8 g/dL (ref 30.0–36.0)
MCV: 89 fL (ref 80.0–100.0)
Platelets: 263 10*3/uL (ref 150–400)
RBC: 4.29 MIL/uL (ref 4.22–5.81)
RDW: 13.5 % (ref 11.5–15.5)
WBC: 7.2 10*3/uL (ref 4.0–10.5)
nRBC: 0 % (ref 0.0–0.2)

## 2021-09-25 LAB — COMPREHENSIVE METABOLIC PANEL
ALT: 19 U/L (ref 0–44)
AST: 13 U/L — ABNORMAL LOW (ref 15–41)
Albumin: 3.9 g/dL (ref 3.5–5.0)
Alkaline Phosphatase: 36 U/L — ABNORMAL LOW (ref 38–126)
Anion gap: 7 (ref 5–15)
BUN: 26 mg/dL — ABNORMAL HIGH (ref 8–23)
CO2: 27 mmol/L (ref 22–32)
Calcium: 9.3 mg/dL (ref 8.9–10.3)
Chloride: 103 mmol/L (ref 98–111)
Creatinine, Ser: 1.31 mg/dL — ABNORMAL HIGH (ref 0.61–1.24)
GFR, Estimated: 59 mL/min — ABNORMAL LOW (ref >60)
Glucose, Bld: 114 mg/dL — ABNORMAL HIGH (ref 70–99)
Potassium: 4.5 mmol/L (ref 3.5–5.1)
Sodium: 137 mmol/L (ref 135–145)
Total Bilirubin: 0.6 mg/dL (ref 0.3–1.2)
Total Protein: 6.8 g/dL (ref 6.5–8.1)

## 2021-09-25 LAB — LIPASE, BLOOD: Lipase: 38 U/L (ref 11–51)

## 2021-09-25 IMAGING — CT CT RENAL STONE PROTOCOL
2 of 4 series · 16 of 46 positions shown, 18 images · non-contrast
Comparison: None

CLINICAL DATA: Abdominal pain, RIGHT lower abdominal and flank pain
after eating the last 3-4 days, this morning completely over lower
abdomen, question kidney stone, nausea since [TA] hours

EXAM:
CT ABDOMEN AND PELVIS WITHOUT CONTRAST
TECHNIQUE: Multidetector CT imaging of the abdomen and pelvis was performed
following the standard protocol without IV contrast. Sagittal and
coronal MPR images reconstructed from axial data set. No oral
contrast administered.

[Series 2: axial st · axial · 0.87mm/px · z∈[+821,+1221]mm · 13 of 90 slices shown, 15 images]
[im 5/90  soft-tissue]
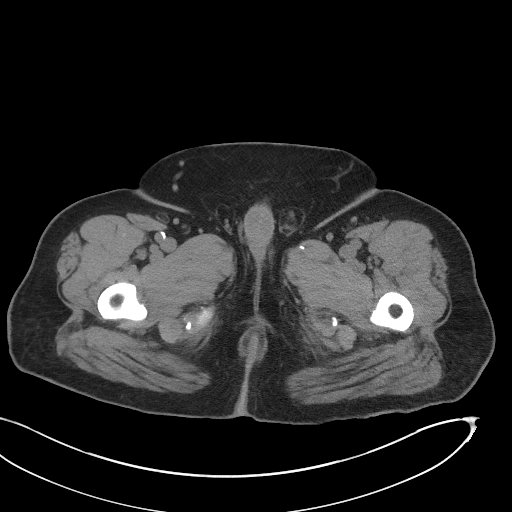
[im 5/90  bone]
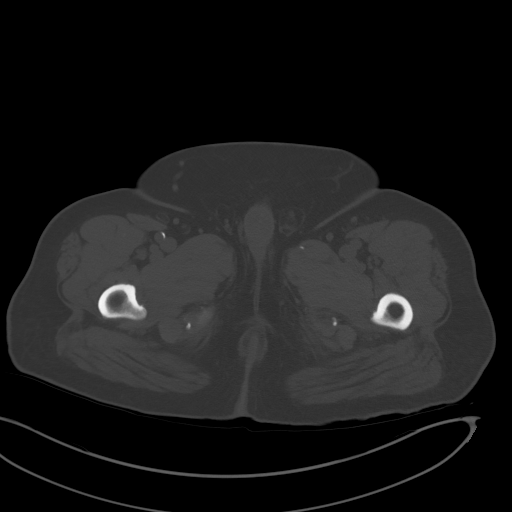
[im 10/90  soft-tissue]
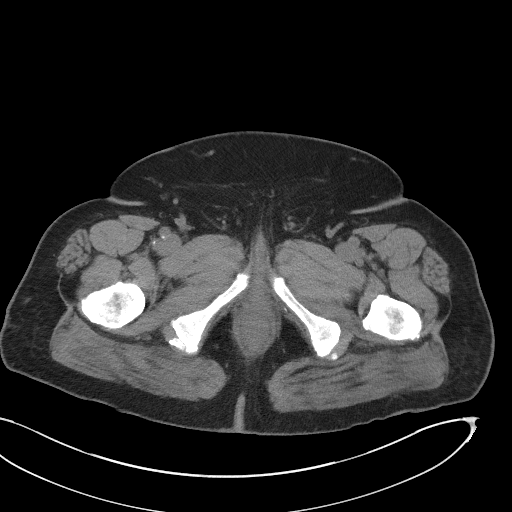
[im 20/90  soft-tissue]
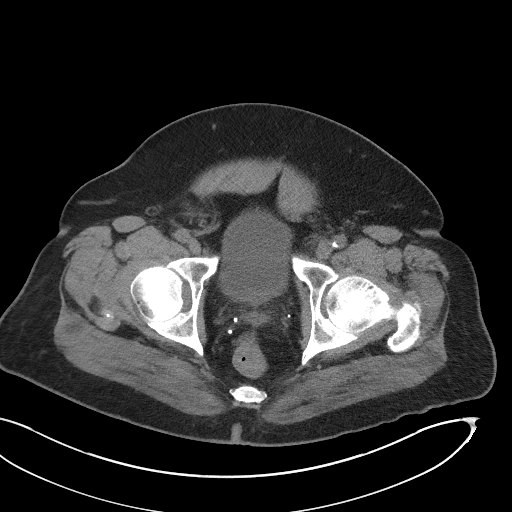
[im 25/90  soft-tissue]
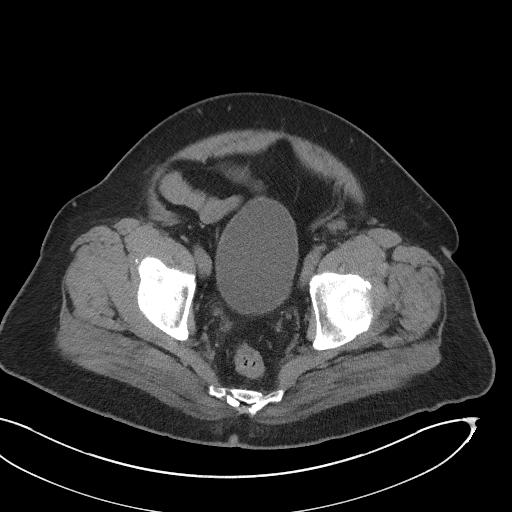
[im 30/90  soft-tissue]
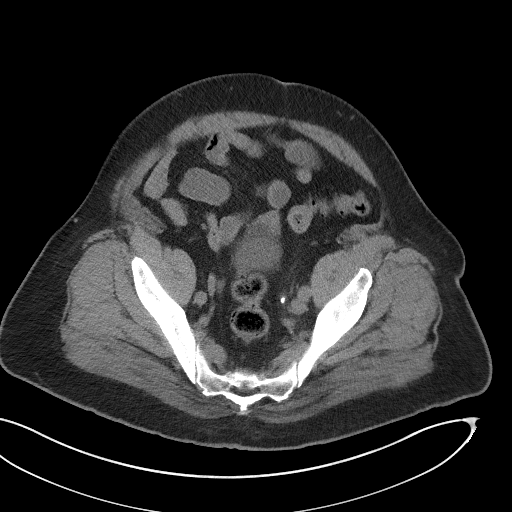
[im 40/90  soft-tissue]
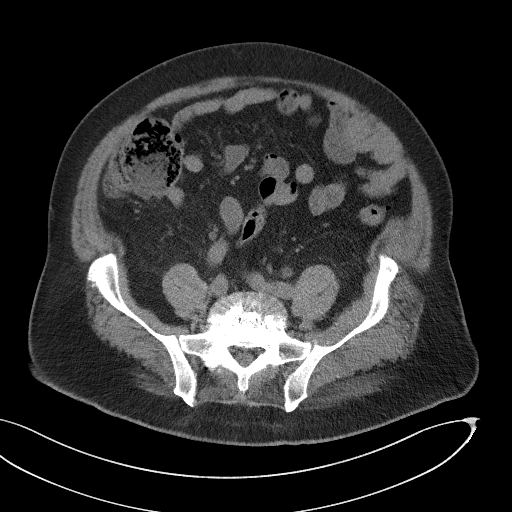
[im 45/90  soft-tissue]
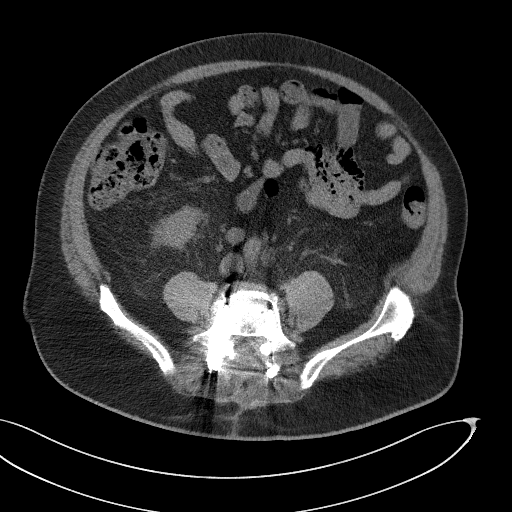
[im 50/90  soft-tissue]
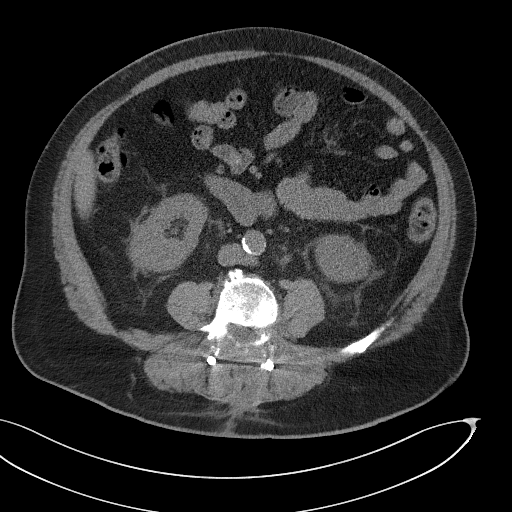
[im 60/90  soft-tissue]
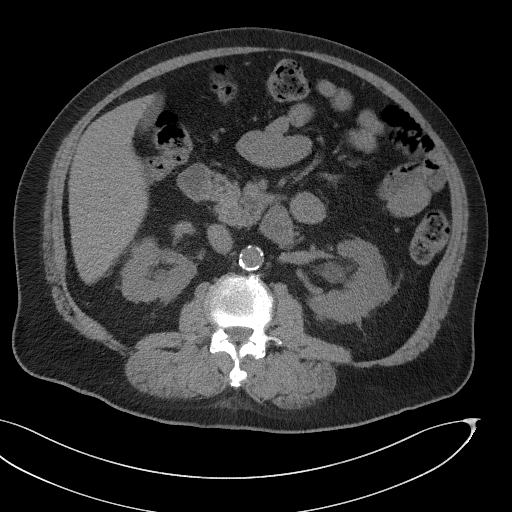
[im 60/90  bone]
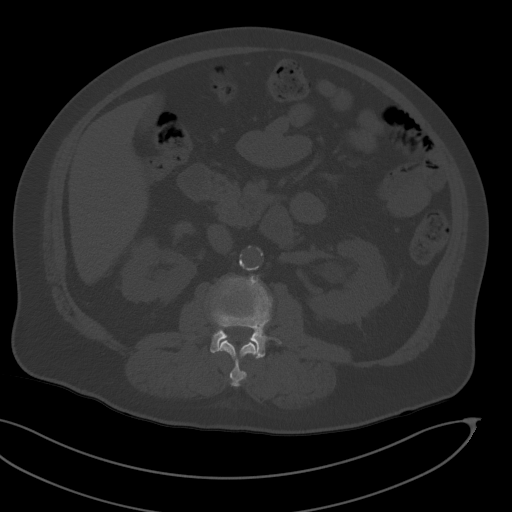
[im 65/90  soft-tissue]
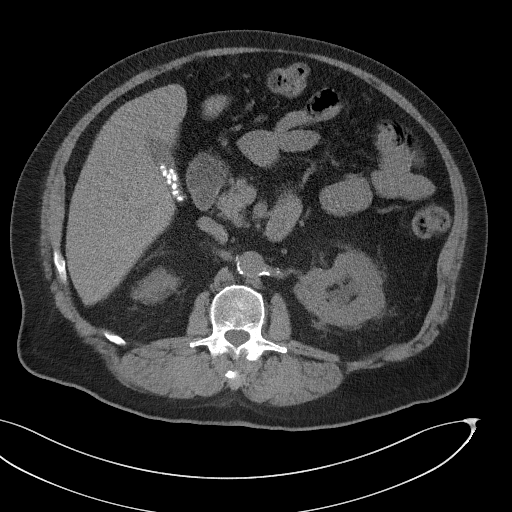
[im 70/90  soft-tissue]
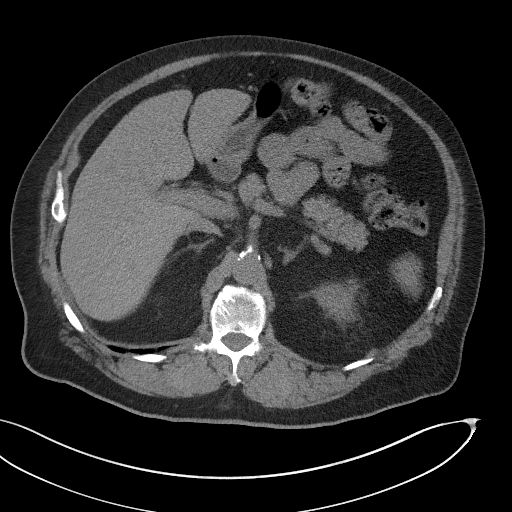
[im 80/90  soft-tissue]
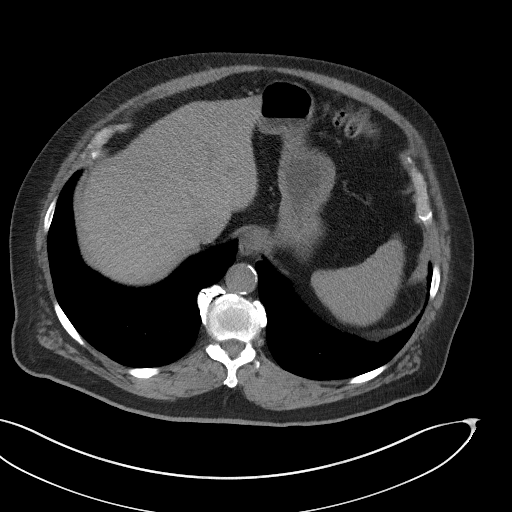
[im 85/90  soft-tissue]
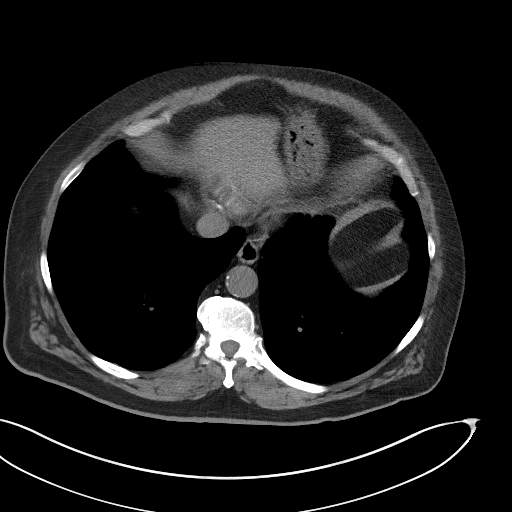

[Series 5: coronal st · coronal · 0.83mm/px · 3 of 130 slices shown]
[im 44/130  soft-tissue]
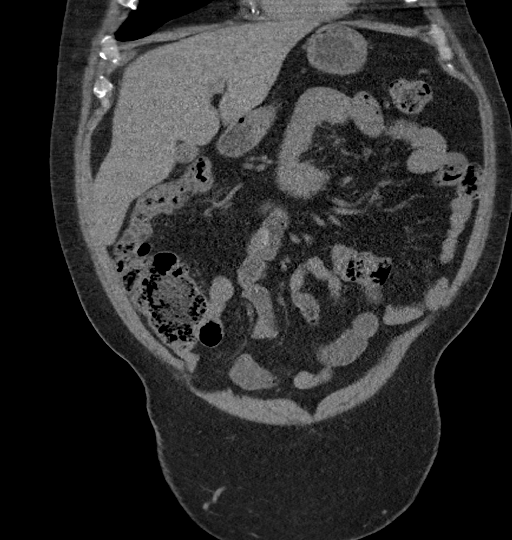
[im 58/130  soft-tissue]
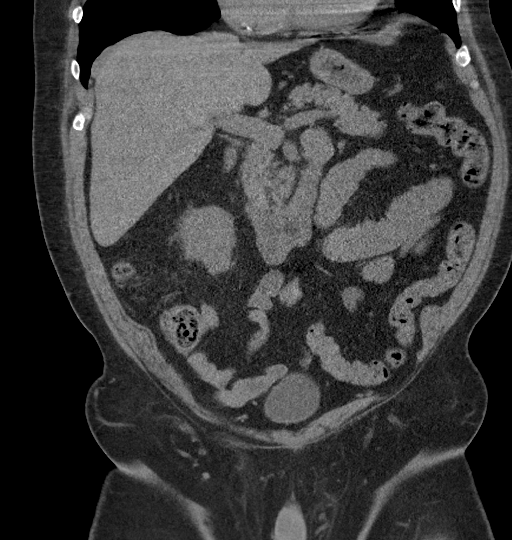
[im 72/130  soft-tissue]
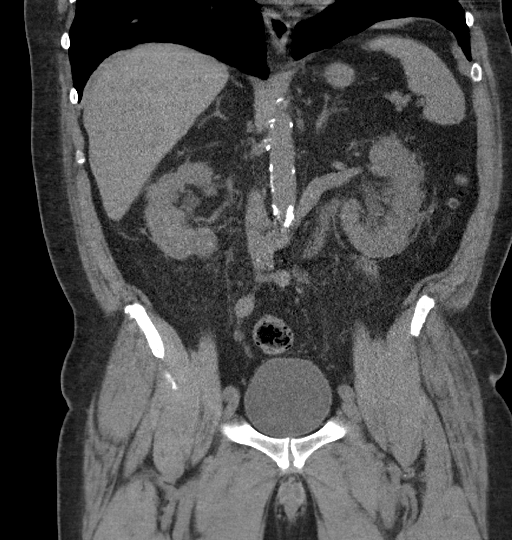

[16 of 46 positions shown; findings below may reference images not displayed]

FINDINGS: Lower chest: Minimal subsegmental atelectasis at LEFT lower lobe. 4
mm RIGHT lower lobe nodule image 23.

Hepatobiliary: Multiple small calcified gallstones within
gallbladder. No biliary dilatation or choledocholithiasis. Liver
unremarkable.

Pancreas: Normal appearance

Spleen: Normal appearance

Adrenals/Urinary Tract: Adrenal glands normal appearance. LEFT
hydronephrosis and hydroureter secondary to a 4 mm distal LEFT
ureteral calculus image 61. Mild RIGHT hydronephrosis and
hydroureter secondary to a 2 mm distal RIGHT ureteral calculus image
68 just above ureterovesical junction. Mild perinephric edema at
both kidneys. Bladder unremarkable.

Stomach/Bowel: Appendix not visualized. Minimal distal colonic
diverticulosis without evidence of diverticulitis. Stomach and bowel
loops otherwise normal appearance.

Vascular/Lymphatic: Atherosclerotic calcifications aorta and iliac
arteries as well as femoral arteries and minimally in coronary
arteries. Aorta normal caliber. No adenopathy. Few pelvic
phleboliths. Retroaortic LEFT renal vein noted.

Reproductive: Unremarkable prostate gland and seminal vesicles

Other: Small BILATERAL inguinal hernias containing fat. No free air
or free fluid.

Musculoskeletal: Prior lower lumbar fusion L4-S1 without
incorporation of the disc prostheses. Prior laminectomies of L4 and
L5. No acute osseous findings.
IMPRESSION: BILATERAL hydronephrosis and hydroureter secondary to a 4 mm distal
LEFT ureteral calculus and a 2 mm distal RIGHT ureteral calculus
just above ureterovesical junction.

Cholelithiasis.

Minimal distal colonic diverticulosis without evidence of
diverticulitis.

Small BILATERAL inguinal hernias containing fat.

4 mm RIGHT lower lobe nodule; no follow-up needed if patient is
low-risk. Non-contrast chest CT can be considered in 12 months if
patient is high-risk. This recommendation follows the consensus
statement: Guidelines for Management of Incidental Pulmonary Nodules
Detected on CT Images: From the [HOSPITAL] [TA]; Radiology

Aortic Atherosclerosis ([TA]-[TA]).

## 2021-09-25 MED ORDER — KETOROLAC TROMETHAMINE 30 MG/ML IJ SOLN
15.0000 mg | Freq: Once | INTRAMUSCULAR | Status: AC
Start: 2021-09-25 — End: 2021-09-25
  Administered 2021-09-25: 15 mg via INTRAMUSCULAR
  Filled 2021-09-25: qty 1

## 2021-09-25 MED ORDER — NITROFURANTOIN MONOHYD MACRO 100 MG PO CAPS: 100.0000 mg | ORAL_CAPSULE | Freq: Two times a day (BID) | ORAL | 0 refills | Status: DC

## 2021-09-25 MED ORDER — ONDANSETRON HCL 4 MG PO TABS: 4.0000 mg | ORAL_TABLET | Freq: Three times a day (TID) | ORAL | 0 refills | Status: DC | PRN

## 2021-09-25 MED ORDER — OXYCODONE-ACETAMINOPHEN 5-325 MG PO TABS
1.0000 | ORAL_TABLET | Freq: Once | ORAL | Status: AC
  Administered 2021-09-25: 1 via ORAL
  Filled 2021-09-25: qty 1

## 2021-09-25 MED ORDER — TAMSULOSIN HCL 0.4 MG PO CAPS: 0.4000 mg | ORAL_CAPSULE | Freq: Once | ORAL | 0 refills | Status: DC

## 2021-09-25 MED ORDER — OXYCODONE-ACETAMINOPHEN 5-325 MG PO TABS: 1.0000 | ORAL_TABLET | Freq: Three times a day (TID) | ORAL | 0 refills | Status: AC | PRN

## 2021-09-25 MED ORDER — HYDROMORPHONE HCL 1 MG/ML IJ SOLN: 0.5000 mg | Freq: Once | INTRAMUSCULAR | Status: DC

## 2021-09-25 MED ORDER — ONDANSETRON 8 MG PO TBDP
8.0000 mg | ORAL_TABLET | Freq: Once | ORAL | Status: AC
Start: 2021-09-25 — End: 2021-09-25
  Administered 2021-09-25: 8 mg via ORAL
  Filled 2021-09-25: qty 1

## 2021-09-25 NOTE — ED Triage Notes (Signed)
Pt presents to ED with lower abdominal pain and nausea since 0600 this am. Pt states he had localized right sided lower abdominal pain after eating the last 3-4 days but this morning is over complete lower abdomen.

## 2021-09-25 NOTE — ED Provider Notes (Addendum)
[facility] EMERGENCY DEPARTMENT Provider Note   CSN: [id number] Arrival date & time: 09/25/21  8182     History Chief Complaint  Patient presents with   Abdominal Pain    [name] [name] is a 70 y.o. male.  HPI  Patient with significant medical history including diabetes type 2, heart murmurs, colon polyps, hypertension, presents with chief complaint of right lower quadrant pain.  Patient states pain started around 6 AM this morning, started after he ate breakfast, states he had pain in his right lower quadrant, and will feel it radiating into his right flank, pain is constant, is worsened with movements, describes as a sharp-like sensation, he has had associated nausea without vomiting, he states he is having some difficulty urinating but denies dysuria, hematuria, denies testicular pain, penile discharge, states that he has been slightly constipated over the last couple days, still passing gas,  denies melena or hematochezia, he denies any alleviating or aggravating factors.  Patient states that he has no history of kidney stones, PUD, bowel obstructions, complicated diverticulitis, he has had appendicectomy, and a vasectomy performed.  He has subjective fevers but denies chills, nasal congestion, sore throat, cough, chest pain, worsening pedal edema.   Past Medical History:  Diagnosis Date   Allergic rhinitis, unspecified    Allergy testing via allergist 04/2017--mostly neg.  Dr. Donneta Romberg recommended referral to ENT so i did this.   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   Back pain    LBP->DDD/spondylosis w/ intermittent radiulopathy-type leg pains, + bilat hip and thigh pain-->spinal stenosis L4-5, L5-S1->Dr. Vertell Limber to do decomp and fusion surgery, scheduled for 12/03/20   BPH (benign prostatic hypertrophy)    no hydro on renal u/s 05/2020   Chronic nasal congestion    DDD (degenerative disc disease), lumbar 2010   laminectomy 01/2009.  MR rpt  05/2019->advanced DDD/spondylosis L3-S1, with mod/sev L4-5 spinal stenosis-->plan for surg per Dr. Vertell Limber as of 09/2020   Depression    Hospitalized for suicidal ideation 1992.  Has been on lexapro since 10/2008.   Diabetes mellitus type II    Dx'd approximately 06/2008.  No retinopathy as of 02/2017 ophth.   Erectile dysfunction    Heart murmur, systolic 99/3716   mild intensity, asymptomatic; echo 01/2018 valves fine->obs   History of colon polyps 01/2016   Recall 3 yrs   History of pneumonia 2008   Hospitalized   History of scarlet fever    age 47   Hyperlipidemia, mixed 1993   myalgias on pravastatin   Hypertension 2009   "marked chronic cardiomegaly" on CXR 01/2009 per old records.   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 Delice Lesch   MRI brain 03/01/20 NORMAL   OSA on CPAP 2004; 2019   Back on CPAP 04/2018.  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 and surgery for this (?) around 1990    Patient Active Problem List   Diagnosis Date Noted   Cystoid macular edema of left eye 08/26/2021   Nonproliferative diabetic  retinopathy of right eye ([place]) 08/26/2021   Nonproliferative diabetic retinopathy of left eye ([place]) 08/26/2021   History of vitrectomy 08/26/2021   Nuclear sclerotic cataract of right eye 08/26/2021   Pseudophakia of left eye 08/26/2021   Keratoconus, stable condition, left 08/26/2021   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   Lumbago with sciatica, right side 09/02/2020   Chronic bilateral low back pain with bilateral sciatica 09/02/2020   Vitamin D deficiency 04/16/2020   Depression 04/16/2020   Class 1 obesity with serious  comorbidity and body mass index (BMI) of 32.0 to 32.9 in adult 04/16/2020   Other fatigue 02/14/2018   Shortness of breath on exertion 02/14/2018   Type 2 diabetes mellitus with hyperglycemia, with long-term current use of insulin ([place]) 02/14/2018   Abnormal EKG 02/14/2018   Maxillary sinusitis, acute 12/17/2015   Cough 12/17/2015   Fever 12/17/2015   Diabetes mellitus without complication ([place]) 03/00/9233   Tooth pain 06/21/2013   Orthostatic lightheadedness 05/17/2013   Health maintenance examination 05/17/2013   Groin strain 11/10/2012   Hypogonadism, male 01/11/2012   Diabetes mellitus ([place]) 07/08/2011   HTN (hypertension), benign 07/08/2011   Hyperlipidemia 07/08/2011   BPH (benign prostatic hypertrophy) 07/08/2011   Hx of adenomatous colonic polyps 07/08/2011    Past Surgical History:  Procedure Laterality Date   APPENDECTOMY  1972   COLONOSCOPY  X 4   Most recent was 2007 [place], [place]), with removal of polyps in the first two.  02/21/16: tubular adenoma.  Recall 3 yrs (Dr. Ardis Hughs).   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 Vertell Limber)   [place]     age 29   TRANSTHORACIC ECHOCARDIOGRAM  02/22/2018   EF 55-60%, grd I DD. Aortic root dilation 41 mm.  05/2021 echo unchanged except aortic root dilation down to 39 mm. Plan rpt 1 yr to monitor aortic root dilation.   URETHRAL DILATION     2 as a child, one as an adult   VASECTOMY  1994   VITRECTOMY         Family History  Problem Relation Age of Onset   Heart disease Mother    Hyperlipidemia Mother    Stroke Mother    Diabetes Mother    Depression Mother    Alzheimer's disease Mother    Stroke Father    Hyperlipidemia Father    Heart disease Father    Diabetes Father    Hypertension Father    Obesity Father    Alcohol abuse Sister    Depression Sister     Social  History   Tobacco Use   Smoking status: Former   Smokeless tobacco: Never  Scientific laboratory technician Use: Never used  Substance Use Topics   Alcohol use: Yes    Comment: social, 3-4 drinks month   Drug use: No    Home Medications Prior to Admission medications   Medication Sig Start Date End Date Taking? Authorizing Provider  nitrofurantoin, macrocrystal-monohydrate, (MACROBID) 100 MG capsule Take 1 capsule (100 mg total) by mouth 2 (two) times daily. 09/25/21  Yes Marcello Fennel, PA-C  ondansetron (ZOFRAN) 4 MG tablet Take 1 tablet (4 mg total) by mouth every 8 (eight) hours as needed for nausea or vomiting. 09/25/21  Yes Marcello Fennel, PA-C  oxyCODONE-acetaminophen (PERCOCET/ROXICET) 5-325 MG tablet Take 1 tablet  by mouth every 8 (eight) hours as needed for up to 3 days for severe pain. 09/25/21 09/28/21 Yes Marcello Fennel, PA-C  tamsulosin (FLOMAX) 0.4 MG CAPS capsule Take 1 capsule (0.4 mg total) by mouth once for 1 dose. 09/25/21 09/25/21 Yes Marcello Fennel, PA-C  aspirin 81 MG EC tablet Take 1 tablet (81 mg total) by mouth daily. 11/21/20   McGowen, Adrian Blackwater, MD  atorvastatin (LIPITOR) 80 MG tablet TAKE 1 TABLET(80 MG) BY MOUTH DAILY 08/07/21   McGowen, Adrian Blackwater, MD  azelastine (ASTELIN) 0.1 % nasal spray Place 2 sprays into both nostrils 2 (two) times daily. Use in each nostril as directed Patient taking differently: Place 2 sprays into both nostrils 2 (two) times daily. Use in each nostril as needed 11/21/20   McGowen, Adrian Blackwater, MD  buPROPion [facility] SR) 150 MG 12 hr tablet Take 1 tablet (150 mg total) by mouth 2 (two) times daily. 09/15/21   Danford, Valetta Fuller D, NP  Cholecalciferol (VITAMIN D3) 125 MCG (5000 UT) CAPS Take 1 capsule (5,000 Units total) by mouth daily. 06/30/21   Abby Potash, PA-C  Choline Fenofibrate (FENOFIBRIC ACID) 135 MG CPDR TAKE 1 CAPSULE BY MOUTH DAILY 08/07/21   McGowen, Adrian Blackwater, MD  escitalopram (LEXAPRO) 20 MG tablet TAKE 1 TABLET(20 MG) BY  MOUTH DAILY 09/15/21   Danford, Valetta Fuller D, NP  finasteride (PROSCAR) 5 MG tablet TAKE 1 TABLET(5 MG) BY MOUTH DAILY 08/07/21   McGowen, Adrian Blackwater, MD  fluticasone (FLONASE) 50 MCG/ACT nasal spray Place 2 sprays into both nostrils daily. Patient taking differently: Place 2 sprays into both nostrils daily as needed for allergies. 02/13/19   McGowen, Adrian Blackwater, MD  gabapentin (NEURONTIN) 300 MG capsule Take 900 mg by mouth at bedtime. 05/17/19   Erline Levine, MD  glucose blood (ONE TOUCH ULTRA TEST) test strip 1 each by Other route 3 (three) times daily. Test sugars up to three times daily. ( insurance preference  DX E11.65 E11.9) 04/05/20   McGowen, Adrian Blackwater, MD  glucose blood test strip Per Insurance Preference- Test sugars up to three times daily DX E11.9 03/28/20   McGowen, Adrian Blackwater, MD  HYDROcodone-acetaminophen (NORCO/VICODIN) 5-325 MG tablet Take 1 tablet by mouth every 6 (six) hours as needed. 01/04/21   [provider]  insulin detemir (LEVEMIR FLEXTOUCH) 100 UNIT/ML FlexPen Inject 50 Units into the skin every evening. 06/24/21   McGowen, Adrian Blackwater, MD  Insulin Pen Needle (BD PEN NEEDLE NANO 2ND GEN) 32G X 4 MM MISC USE TO INJECT 4 TIMES DAILY 08/11/21   Danford, Berna Spare, NP  Krill Oil 500 MG CAPS Take 500 mg by mouth daily.    [provider]  metFORMIN (GLUCOPHAGE) 1000 MG tablet TAKE 1 TABLET BY MOUTH TWICE DAILY WITH A MEAL 08/07/21   McGowen, Adrian Blackwater, MD  methocarbamol (ROBAXIN) 500 MG tablet Take by mouth as needed. 05/14/21   [provider]  Semaglutide, 2 MG/DOSE, (OZEMPIC, 2 MG/DOSE,) 8 MG/3ML SOPN Inject 2 mg into the skin once a week. 09/15/21   Danford, Valetta Fuller D, NP  sodium chloride (OCEAN) 0.65 % nasal spray Place 1 spray into the nose daily as needed.    [provider]  vitamin B-12 (CYANOCOBALAMIN) 1000 MCG tablet Take 2,000 mcg by mouth daily.    [provider]    Allergies    Simvastatin; Antihistamines, diphenhydramine-type; Codeine; Penicillins;  and Sulfa antibiotics  Review of Systems   Review of Systems  Constitutional:  Positive  for fever. Negative for chills.  HENT:  Negative for congestion.   Respiratory:  Negative for shortness of breath.   Cardiovascular:  Negative for chest pain.  Gastrointestinal:  Positive for abdominal pain, constipation and nausea. Negative for blood in stool and vomiting.  Genitourinary:  Positive for difficulty urinating and flank pain. Negative for enuresis, penile discharge and testicular pain.  Musculoskeletal:  Negative for back pain.  Skin:  Negative for rash.  Neurological:  Negative for dizziness and headaches.  Hematological:  Does not bruise/bleed easily.   Physical Exam Updated Vital Signs BP 132/77   Pulse 86   Temp 97.7 F (36.5 C) (Oral)   Resp 19   Ht _0  (1.778 m)   Wt 103 kg   SpO2 99%   BMI 32.57 kg/m   Physical Exam Vitals and nursing note reviewed.  Constitutional:      General: He is not in acute distress.    Appearance: He is not ill-appearing.  HENT:     Head: Normocephalic and atraumatic.     Nose: No congestion.     Mouth/Throat:     Mouth: Mucous membranes are moist.     Pharynx: Oropharynx is clear.  Eyes:     Conjunctiva/sclera: Conjunctivae normal.  Cardiovascular:     Rate and Rhythm: Normal rate and regular rhythm.     Pulses: Normal pulses.     Heart sounds: No murmur heard.   No friction rub. No gallop.  Pulmonary:     Effort: No respiratory distress.     Breath sounds: No wheezing, rhonchi or rales.  Abdominal:     Palpations: Abdomen is soft.     Tenderness: There is abdominal tenderness. There is no right CVA tenderness or left CVA tenderness.     Comments: Abdomen nondistended, normal bowel sounds, dull to percussion, he had tenderness to palpation in his right lower quadrant as well as his right flank, there is no guarding, rebound tenderness, peritoneal sign, negative Murphy sign or McBurney point, he had no right-sided CVA  tenderness.   Musculoskeletal:     Right lower leg: No edema.     Left lower leg: No edema.  Skin:    General: Skin is warm and dry.  Neurological:     Mental Status: He is alert.  Psychiatric:        Mood and Affect: Mood normal.    ED Results / Procedures / Treatments   Labs (all labs ordered are listed, but only abnormal results are displayed) Labs Reviewed  COMPREHENSIVE METABOLIC PANEL - Abnormal; Notable for the following components:      Result Value   Glucose, Bld 114 (*)    BUN 26 (*)    Creatinine, Ser 1.31 (*)    AST 13 (*)    Alkaline Phosphatase 36 (*)    GFR, Estimated 59 (*)    All other components within normal limits  CBC - Abnormal; Notable for the following components:   Hemoglobin 12.9 (*)    HCT 38.2 (*)    All other components within normal limits  URINALYSIS, ROUTINE W REFLEX MICROSCOPIC - Abnormal; Notable for the following components:   Color, Urine AMBER (*)    APPearance CLOUDY (*)    Hgb urine dipstick MODERATE (*)    Protein, ur 100 (*)    RBC / HPF >50 (*)    WBC, UA >50 (*)    All other components within normal limits  URINE CULTURE  LIPASE, BLOOD  EKG None  Radiology CT Renal Stone Study  Result Date: 09/25/2021 CLINICAL DATA:  Abdominal pain, RIGHT lower abdominal and flank pain after eating the last 3-4 days, this morning completely over lower abdomen, question kidney stone, nausea since 0600 hours EXAM: CT ABDOMEN AND PELVIS WITHOUT CONTRAST TECHNIQUE: Multidetector CT imaging of the abdomen and pelvis was performed following the standard protocol without IV contrast. Sagittal and coronal MPR images reconstructed from axial data set. No oral contrast administered. COMPARISON:  None FINDINGS: Lower chest: Minimal subsegmental atelectasis at LEFT lower lobe. 4 mm RIGHT lower lobe nodule image 23. Hepatobiliary: Multiple small calcified gallstones within gallbladder. No biliary dilatation or choledocholithiasis. Liver unremarkable.  Pancreas: Normal appearance Spleen: Normal appearance Adrenals/Urinary Tract: Adrenal glands normal appearance. LEFT hydronephrosis and hydroureter secondary to a 4 mm distal LEFT ureteral calculus image 61. Mild RIGHT hydronephrosis and hydroureter secondary to a 2 mm distal RIGHT ureteral calculus image 68 just above ureterovesical junction. Mild perinephric edema at both kidneys. Bladder unremarkable. Stomach/Bowel: Appendix not visualized. Minimal distal colonic diverticulosis without evidence of diverticulitis. Stomach and bowel loops otherwise normal appearance. Vascular/Lymphatic: Atherosclerotic calcifications aorta and iliac arteries as well as femoral arteries and minimally in coronary arteries. Aorta normal caliber. No adenopathy. Few pelvic phleboliths. Retroaortic LEFT renal vein noted. Reproductive: Unremarkable prostate gland and seminal vesicles Other: Small BILATERAL inguinal hernias containing fat. No free air or free fluid. Musculoskeletal: Prior lower lumbar fusion L4-S1 without incorporation of the disc prostheses. Prior laminectomies of L4 and L5. No acute osseous findings. IMPRESSION: BILATERAL hydronephrosis and hydroureter secondary to a 4 mm distal LEFT ureteral calculus and a 2 mm distal RIGHT ureteral calculus just above ureterovesical junction. Cholelithiasis. Minimal distal colonic diverticulosis without evidence of diverticulitis. Small BILATERAL inguinal hernias containing fat. 4 mm RIGHT lower lobe nodule; no follow-up needed if patient is low-risk. Non-contrast chest CT can be considered in 12 months if patient is high-risk. This recommendation follows the consensus statement: Guidelines for Management of Incidental Pulmonary Nodules Detected on CT Images: From the Fleischner Society 2017; Radiology 2017; 284:228-243. Aortic Atherosclerosis (ICD10-I70.0). Electronically Signed   By: Lavonia Dana M.D.   On: 09/25/2021 14:41    Procedures Procedures   Medications Ordered in  ED Medications  ketorolac (TORADOL) 30 MG/ML injection 15 mg (has no administration in time range)  oxyCODONE-acetaminophen (PERCOCET/ROXICET) 5-325 MG per tablet 1 tablet (1 tablet Oral Given 09/25/21 1344)  ondansetron (ZOFRAN-ODT) disintegrating tablet 8 mg (8 mg Oral Given 09/25/21 1344)    ED Course  I have reviewed the triage vital signs and the nursing notes.  Pertinent labs & imaging results that were available during my care of the patient were reviewed by me and considered in my medical decision making (see chart for details).    MDM Rules/Calculators/A&P                           Initial impression-presents with right lower quadrant pain, he is alert, does not appear in acute distress, vital signs are reassuring.  Triage obtain basic lab work-up, will add on CT renal for further evaluation.  Work-up-CBC shows slightNormocytic anemia hemoglobin 12.9, glucose 114, BUN 26, creatinine slightly elevated at 1.31, alk phos 36, GFR 59, UA shows proteins, red blood cells, white blood cells, no bacteria, no nitrates or leukocytes.  CT renal reveals bilateral hydronephrosis and hydroureter secondary due to a 4 mm left ureter stone as well as a 2 mm distal right  ureter stone just above the UVJ point.  There is also small bilateral inguinal hernias containing fat.  A 4 mm right lower lobe nodule follow-up with noncontrast CT scan for reevaluation.  Reassessment-patient was assessed after nausea medication pain medication, he states his pain is starting to come back.  He has no other complaints at this time.  Updated on imaging.  Will consult with urology for further evaluation as he does have bilateral stones.  Updated patient on plan from urology he is agreement with this, and is ready for discharge.  Consult-spoke with Dr. Alyson Ingles of urology he states since patient still making urine he can be discharged home, he would start the patient on antibiotics, provide with pain medications,  antiemetics, Flomax.  He would like to see the patient tomorrow for further evaluation.  Rule out-I have low suspicion for systemic infection as patient is nontoxic-appearing, vital signs are reassuring no leukocytosis present.  Low suspicion for bowel obstruction, complicated diverticulitis, [place], as CT imaging are negative for these findings.  Low suspicion for pancreatitis as lipase is within normal limits.  I have low suspicion for emergent lithotripsy as his creatinine is only slightly elevated, he still making urine, he is nontoxic-appearing.  Plan-  Bilateral kidney stones-patient was started on antibiotics, given Flomax, antiemetics, pain medications, check to follow-up with urology tomorrow for further evaluation.  Given strict return precautions.  Patient is a noted sulfa allergy, as well as a penicillin allergy so I have elected to use nitrofurantoin to cover him for UTI. Patient was updated on small bilateral inguinal hernias as well as the 4 mm right lower lobe nodule he will follow-up with PCP for further evaluation of these.  Vital signs have remained stable, no indication for hospital admission.  Patient discussed with attending and they agreed with assessment and plan.  Patient given at home care as well strict return precautions.  Patient verbalized that they understood agreed to said plan.  Final Clinical Impression(s) / ED Diagnoses Final diagnoses:  Calculus of lower urinary tract    Rx / DC Orders ED Discharge Orders          Ordered    oxyCODONE-acetaminophen (PERCOCET/ROXICET) 5-325 MG tablet  Every 8 hours PRN        09/25/21 1534    ondansetron (ZOFRAN) 4 MG tablet  Every 8 hours PRN        09/25/21 1534    tamsulosin (FLOMAX) 0.4 MG CAPS capsule   Once        09/25/21 1534    nitrofurantoin, macrocrystal-monohydrate, (MACROBID) 100 MG capsule  2 times daily        09/25/21 1534             Marcello Fennel, PA-C 09/25/21 1539    Marcello Fennel, PA-C 09/25/21 1542    Fredia Sorrow, MD 10/01/21 1554

## 2021-09-25 NOTE — Discharge Instructions (Addendum)
You have bilateral kidney stones in your left and right ureters, I have started you on antibiotics please take as prescribed.  Have also given you Flomax to help dilate your ureters.  To help urinates, this medication can drop your blood pressure if you become lightheaded or dizziness please discontinue this medications.  I have given you written for Zofran to use as needed for nausea. I have given you a short course of narcotics please take as prescribed.  This medication can make you drowsy do not consume alcohol or operate heavy machinery when taking this medication.  This medication is Tylenol in it do not take Tylenol and take this medication.  Please call Dr. Noland Fordyce office today as you like to see you tomorrow.  You have 2 incidental findings to fat-containing inguinal hernias as well as a 4 mm nodule in your right lung which they recommend follow-up CT scan in 12 months.  Please follow your PCP for further evaluation.  Come back to the emergency department if you are unable to urinate, has severe lower abdominal pain, develop fevers, chills, uncontrolled nausea, vomiting, or symptoms worsen.

## 2021-09-25 NOTE — Progress Notes (Deleted)
OFFICE VISIT  09/25/2021  CC: No chief complaint on file.   HPI:    Patient is a 70 y.o. male who presents for abdominal pain.  INTERIM HX: ***  Past Medical History:  Diagnosis Date   Allergic rhinitis, unspecified    Allergy testing via allergist 04/2017--mostly neg.  Dr. Donneta Romberg recommended referral to ENT so i did this.   Aortic root dilatation ([place]) 2019   2019, 41 mm, 2022, 39 mm-->rpt 1 yr.   Back pain    LBP->DDD/spondylosis w/ intermittent radiulopathy-type leg pains, + bilat hip and thigh pain-->spinal stenosis L4-5, L5-S1->Dr. Vertell Limber to do decomp and fusion surgery, scheduled for 12/03/20   BPH (benign prostatic hypertrophy)    no hydro on renal u/s 05/2020   Chronic nasal congestion    DDD (degenerative disc disease), lumbar 2010   laminectomy 01/2009.  MR rpt 05/2019->advanced DDD/spondylosis L3-S1, with mod/sev L4-5 spinal stenosis-->plan for surg per Dr. Vertell Limber as of 09/2020   Depression    Hospitalized for suicidal ideation 1992.  Has been on lexapro since 10/2008.   Diabetes mellitus type II    Dx'd approximately 06/2008.  No retinopathy as of 02/2017 ophth.   Erectile dysfunction    Heart murmur, systolic 35/3299   mild intensity, asymptomatic; echo 01/2018 valves fine->obs   History of colon polyps 01/2016   Recall 3 yrs   History of pneumonia 2008   Hospitalized   History of scarlet fever    age 7   Hyperlipidemia, mixed 1993   myalgias on pravastatin   Hypertension 2009   "marked chronic cardiomegaly" on CXR 01/2009 per old records.   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 Delice Lesch   MRI brain 03/01/20 NORMAL   OSA on CPAP 2004; 2019   Back on CPAP 04/2018.  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  02/21/16: tubular adenoma.  Recall 3 yrs (Dr. Ardis Hughs).   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 Vertell Limber)   [place]     age 73   TRANSTHORACIC ECHOCARDIOGRAM  02/22/2018   EF 55-60%, grd I DD. Aortic root dilation 41 mm.  05/2021 echo unchanged except aortic root dilation down to 39 mm. Plan rpt 1 yr to monitor aortic root dilation.   URETHRAL DILATION     2 as a child, one as an adult   VASECTOMY  1994   VITRECTOMY      Outpatient Medications Prior to Visit  Medication Sig Dispense Refill   aspirin 81 MG EC tablet Take 1 tablet (81 mg total) by mouth daily. 30 tablet 12   atorvastatin (LIPITOR) 80 MG tablet TAKE 1  TABLET(80 MG) BY MOUTH DAILY 90 tablet 0   azelastine (ASTELIN) 0.1 % nasal spray Place 2 sprays into both nostrils 2 (two) times daily. Use in each nostril as directed (Patient taking differently: Place 2 sprays into both nostrils 2 (two) times daily. Use in each nostril as needed) 30 mL 11   buPROPion (WELLBUTRIN SR) 150 MG 12 hr tablet Take 1 tablet (150 mg total) by mouth 2 (two) times daily. 180 tablet 0   Cholecalciferol (VITAMIN D3) 125 MCG (5000 UT) CAPS Take 1 capsule (5,000 Units total) by mouth daily. 30 capsule 0   Choline Fenofibrate (FENOFIBRIC ACID) 135 MG CPDR TAKE 1 CAPSULE BY MOUTH DAILY 90 capsule 0   escitalopram (LEXAPRO) 20 MG tablet TAKE 1 TABLET(20 MG) BY MOUTH DAILY 90 tablet 0   finasteride (PROSCAR) 5 MG tablet TAKE 1 TABLET(5 MG) BY MOUTH DAILY 90 tablet 0   fluticasone (FLONASE) 50 MCG/ACT nasal spray Place 2 sprays  into both nostrils daily. (Patient taking differently: Place 2 sprays into both nostrils daily as needed for allergies.) 48 g 1   gabapentin (NEURONTIN) 300 MG capsule Take 900 mg by mouth at bedtime.     glucose blood (ONE TOUCH ULTRA TEST) test strip 1 each by Other route 3 (three) times daily. Test sugars up to three times daily. ( insurance preference  DX E11.65 E11.9) 100 each 11   glucose blood test strip Per Insurance Preference- Test sugars up to three times daily DX E11.9 100 each 11   HYDROcodone-acetaminophen (NORCO/VICODIN) 5-325 MG tablet Take 1 tablet by mouth every 6 (six) hours as needed.     insulin detemir (LEVEMIR FLEXTOUCH) 100 UNIT/ML FlexPen Inject 50 Units into the skin every evening. 15 mL 6   Insulin Pen Needle (BD PEN NEEDLE NANO 2ND GEN) 32G X 4 MM MISC USE TO INJECT 4 TIMES DAILY 100 each 2   Krill Oil 500 MG CAPS Take 500 mg by mouth daily.     metFORMIN (GLUCOPHAGE) 1000 MG tablet TAKE 1 TABLET BY MOUTH TWICE DAILY WITH A MEAL 180 tablet 1   methocarbamol (ROBAXIN) 500 MG tablet Take by mouth as needed.     Semaglutide, 2 MG/DOSE, (OZEMPIC, 2 MG/DOSE,) 8 MG/3ML SOPN Inject 2 mg into the skin once a week. 9 mL 0   sodium chloride (OCEAN) 0.65 % nasal spray Place 1 spray into the nose daily as needed.     tamsulosin (FLOMAX) 0.4 MG CAPS capsule TAKE 2 CAPSULES(0.8 MG) BY MOUTH AT BEDTIME 180 capsule 0   vitamin B-12 (CYANOCOBALAMIN) 1000 MCG tablet Take 2,000 mcg by mouth daily.     No facility-administered medications prior to visit.    Allergies  Allergen Reactions   Simvastatin Other (See Comments)    myalgias   Antihistamines, Diphenhydramine-Type Other (See Comments)    depression   Codeine Nausea Only   Penicillins Hives   Sulfa Antibiotics Hives    ROS As per HPI  PE: Vitals with BMI 09/15/2021 08/11/2021 07/30/2021  Height 5\' 10"  5\' 10"  (No Data)  Weight 227 lbs 225 lbs (No Data)  BMI 23.53 61.44 -  Systolic 315 400 (No Data)  Diastolic 67 69  (No Data)  Pulse 78 88 -     ***  LABS:  ***  IMPRESSION AND PLAN:  No problem-specific Assessment & Plan notes found for this encounter.   An After Visit Summary was printed and given to the patient.  FOLLOW UP: No follow-ups on file.  Signed:  Crissie Sickles, MD           09/25/2021

## 2021-09-26 ENCOUNTER — Encounter: Payer: Self-pay | Admitting: Urology

## 2021-09-26 ENCOUNTER — Ambulatory Visit (INDEPENDENT_AMBULATORY_CARE_PROVIDER_SITE_OTHER): Payer: Medicare Other | Admitting: Urology

## 2021-09-26 ENCOUNTER — Encounter (HOSPITAL_COMMUNITY)
Admission: RE | Admit: 2021-09-26 | Discharge: 2021-09-26 | Disposition: A | Discharge status: Home / Self Care | Payer: Medicare Other | Source: Ambulatory Visit | Attending: Urology | Admitting: Urology

## 2021-09-26 ENCOUNTER — Encounter (HOSPITAL_COMMUNITY): Payer: Self-pay

## 2021-09-26 DIAGNOSIS — N200 Calculus of kidney: Secondary | ICD-10-CM

## 2021-09-26 DIAGNOSIS — N201 Calculus of ureter: Secondary | ICD-10-CM | POA: Diagnosis not present

## 2021-09-26 LAB — URINALYSIS, ROUTINE W REFLEX MICROSCOPIC
Bilirubin, UA: NEGATIVE
Glucose, UA: NEGATIVE
Ketones, UA: NEGATIVE
Leukocytes,UA: NEGATIVE
Nitrite, UA: NEGATIVE
Protein,UA: NEGATIVE
Specific Gravity, UA: 1.025 (ref 1.005–1.030)
Urobilinogen, Ur: 0.2 mg/dL (ref 0.2–1.0)
pH, UA: 5 (ref 5.0–7.5)

## 2021-09-26 LAB — MICROSCOPIC EXAMINATION
Bacteria, UA: NONE SEEN
Epithelial Cells (non renal): NONE SEEN /hpf (ref 0–10)
RBC, Urine: >30 /hpf — AB (ref 0–2)
Renal Epithel, UA: NONE SEEN /hpf
WBC, UA: NONE SEEN /hpf (ref 0–5)

## 2021-09-26 LAB — URINE CULTURE: Culture: NO GROWTH

## 2021-09-26 NOTE — H&P (View-Only) (Signed)
09/26/2021 12:11 PM   [name] [name] 21-Jun-1951 [id number]  Referring provider: Tammi Sou, MD [address] [place],  [place] [address]  Ureteral calculus   HPI: [name] is a 70yo here for evaluation of ureteral calculi. Starting Monday he developed right flank which then progressed to severe right flank pain and he presented to the ER. He was diagnosed with a 65mm right distal ureteral calculus and a left 26mm ureteral calculus. Creatinine normal. He denies any left flank pain. He has moderate LUTS on flomax 0.4mg  daily. IPSS 20 QOL 5.    PMH: Past Medical History:  Diagnosis Date   Allergic rhinitis, unspecified    Allergy testing via allergist 04/2017--mostly neg.  [name] [name] recommended referral to [name] so i did this.   Aortic root dilatation ([facility]) 2019   2019, 41 mm, 2022, 39 mm-->rpt 1 yr.   Back pain    LBP->DDD/spondylosis w/ intermittent radiulopathy-type leg pains, + bilat hip and thigh pain-->spinal stenosis L4-5, L5-S1->[name] [name] to do decomp and fusion surgery, scheduled for 12/03/20   BPH (benign prostatic hypertrophy)    no hydro on renal u/s 05/2020   Chronic nasal congestion    DDD (degenerative disc disease), lumbar 2010   laminectomy 01/2009.  MR rpt 05/2019->advanced DDD/spondylosis L3-S1, with mod/sev L4-5 spinal stenosis-->plan for surg per [name] [name] as of 09/2020   Depression    Hospitalized for suicidal ideation 1992.  Has been on lexapro since 10/2008.   Diabetes mellitus type II    Dx'd approximately 06/2008.  No retinopathy as of 02/2017 ophth.   Erectile dysfunction    Heart murmur, systolic 73/7106   mild intensity, asymptomatic; echo 01/2018 valves fine->obs   History of colon polyps 01/2016   Recall 3 yrs   History of pneumonia 2008   Hospitalized   History of scarlet fever    age 20   Hyperlipidemia, mixed 1993   myalgias on pravastatin   Hypertension 2009   "marked chronic cardiomegaly" on CXR 01/2009 per old records.    Hypogonadism, male 01/11/2012   Axiron trial started 03/2012   Insomnia    Keratoconus    dx'd 1973   Memory changes    Mild cognitive impairment with memory loss 01/2020 eval with [name] [name]   MRI brain 03/01/20 NORMAL   OSA on CPAP 2004; 2019   Back on CPAP 04/2018.  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  02/21/16: tubular adenoma.  Recall 3 yrs (Dr. Ardis [name]).   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 Aortic root dilation 41 mm.  05/2021 echo unchanged except aortic root dilation down to 39 mm. Plan rpt  1 yr to monitor aortic root dilation.   URETHRAL DILATION     2 as a child, one as an adult   VASECTOMY  1994   VITRECTOMY      Home Medications:  Allergies as of 09/26/2021       Reactions   Simvastatin Other (See Comments)   myalgias   Antihistamines, Diphenhydramine-type Other (See Comments)   depression   Codeine Nausea Only   Penicillins Hives   Sulfa Antibiotics Hives        Medication List        Accurate as of September 26, 2021 12:11 PM. If you have any questions, ask your nurse or doctor.          STOP taking these medications    Vitamin D3 125 MCG (5000 UT) Caps       TAKE these medications    aspirin 81 MG EC tablet Take 1 tablet (81 mg total) by mouth daily.   atorvastatin 80 MG  tablet Commonly known as: LIPITOR TAKE 1 TABLET(80 MG) BY MOUTH DAILY   azelastine 0.1 % nasal spray Commonly known as: ASTELIN Place 2 sprays into both nostrils 2 (two) times daily. Use in each nostril as directed What changed: additional instructions   BD Pen Needle Nano 2nd Gen 32G X 4 MM Misc Generic drug: Insulin Pen Needle USE TO INJECT 4 TIMES DAILY   buPROPion 150 MG 12 hr tablet Commonly known as: WELLBUTRIN SR Take 1 tablet (150 mg total) by mouth 2 (two) times daily.   escitalopram 20 MG tablet Commonly known as: LEXAPRO TAKE 1 TABLET(20 MG) BY MOUTH DAILY   Fenofibric Acid 135 MG Cpdr TAKE 1 CAPSULE BY MOUTH DAILY   finasteride 5 MG tablet Commonly known as: PROSCAR TAKE 1 TABLET(5 MG) BY MOUTH DAILY   fluticasone 50 MCG/ACT nasal spray Commonly known as: FLONASE Place 2 sprays into both nostrils daily. What changed:  when to take this reasons to take this   gabapentin 300 MG capsule Commonly known as: NEURONTIN Take 900 mg by mouth at bedtime.   glucose blood test strip Per Insurance Preference- Test sugars up to three times daily DX E11.9   glucose blood test strip Commonly known as: ONE TOUCH ULTRA TEST 1 each by Other route 3 (three) times daily. Test sugars up to three times daily. ( insurance preference  DX E11.65 E11.9)   HYDROcodone-acetaminophen 5-325 MG tablet Commonly known as: NORCO/VICODIN Take 1 tablet by mouth every 6 (six) hours as needed.   Krill Oil 500 MG Caps Take 500 mg by mouth daily.   Levemir FlexTouch 100 UNIT/ML FlexPen Generic drug: insulin detemir Inject 50 Units into the skin every evening.   metFORMIN 1000 MG tablet Commonly known as: GLUCOPHAGE TAKE 1 TABLET BY MOUTH TWICE DAILY WITH A MEAL   methocarbamol 500 MG tablet Commonly known as: ROBAXIN Take by mouth as needed.   nitrofurantoin (macrocrystal-monohydrate) 100 MG capsule Commonly known as: MACROBID Take 1 capsule (100 mg total) by mouth 2 (two) times  daily.   ondansetron 4 MG tablet Commonly known as: ZOFRAN Take 1 tablet (4 mg total) by mouth every 8 (eight) hours as needed for nausea or vomiting.   oxyCODONE-acetaminophen 5-325 MG tablet Commonly known as: PERCOCET/ROXICET Take 1 tablet by mouth every 8 (eight) hours as needed for up to 3 days for severe pain.   Ozempic (2 MG/DOSE) 8 MG/3ML Sopn Generic drug: Semaglutide (2 MG/DOSE) Inject 2 mg into the skin once  a week.   sodium chloride 0.65 % nasal spray Commonly known as: OCEAN Place 1 spray into the nose daily as needed.   tamsulosin 0.4 MG Caps capsule Commonly known as: FLOMAX Take 1 capsule (0.4 mg total) by mouth once for 1 dose.   vitamin B-12 1000 MCG tablet Commonly known as: CYANOCOBALAMIN Take 2,000 mcg by mouth daily.        Allergies:  Allergies  Allergen Reactions   Simvastatin Other (See Comments)    myalgias   Antihistamines, Diphenhydramine-Type Other (See Comments)    depression   Codeine Nausea Only   Penicillins Hives   Sulfa Antibiotics Hives    Family History: Family History  Problem Relation Age of Onset   Heart disease Mother    Hyperlipidemia Mother    Stroke Mother    Diabetes Mother    Depression Mother    Alzheimer's disease Mother    Stroke Father    Hyperlipidemia Father    Heart disease Father    Diabetes Father    Hypertension Father    Obesity Father    Alcohol abuse Sister    Depression Sister     Social History:  reports that he has quit smoking. He has never used smokeless tobacco. He reports current alcohol use. He reports that he does not use drugs.  ROS: All other review of systems were reviewed and are negative except what is noted above in HPI  Physical Exam: There were no vitals taken for this visit.  Constitutional:  Alert and oriented, No acute distress. HEENT: [place] AT, moist mucus membranes.  Trachea midline, no masses. Cardiovascular: No clubbing, cyanosis, or edema. Respiratory: Normal  respiratory effort, no increased work of breathing. GI: Abdomen is soft, nontender, nondistended, no abdominal masses GU: No CVA tenderness.  Lymph: No cervical or inguinal lymphadenopathy. Skin: No rashes, bruises or suspicious lesions. Neurologic: Grossly intact, no focal deficits, moving all 4 extremities. Psychiatric: Normal mood and affect.  Laboratory Data: Lab Results  Component Value Date   WBC 7.2 09/25/2021   HGB 12.9 (L) 09/25/2021   HCT 38.2 (L) 09/25/2021   MCV 89.0 09/25/2021   PLT 263 09/25/2021    Lab Results  Component Value Date   CREATININE 1.31 (H) 09/25/2021    Lab Results  Component Value Date   PSA 0.02 (L) 05/22/2021   PSA 0.08 (L) 05/16/2020   PSA 0.04 (L) 05/19/2019    Lab Results  Component Value Date   TESTOSTERONE 87.17 (L) 04/13/2012    Lab Results  Component Value Date   HGBA1C 6.0 (H) 09/15/2021    Urinalysis    Component Value Date/Time   COLORURINE AMBER (A) 09/25/2021 1137   APPEARANCEUR CLOUDY (A) 09/25/2021 1137   LABSPEC 1.013 09/25/2021 1137   PHURINE 6.0 09/25/2021 1137   GLUCOSEU NEGATIVE 09/25/2021 1137   HGBUR MODERATE (A) 09/25/2021 1137   BILIRUBINUR NEGATIVE 09/25/2021 1137   KETONESUR NEGATIVE 09/25/2021 1137   PROTEINUR 100 (A) 09/25/2021 1137   NITRITE NEGATIVE 09/25/2021 1137   LEUKOCYTESUR NEGATIVE 09/25/2021 1137    Lab Results  Component Value Date   LABMICR <3.0 12/12/2019   BACTERIA NONE SEEN 09/25/2021    Pertinent Imaging: CT stone study 10/27: Images reviewed and discussed with the patient  No results found for this or any previous visit.  No results found for this or any previous visit.  No results found for this or any previous visit.  No results found for this or any previous  visit.  Results for orders placed in visit on 05/16/20  US RENAL  Narrative CLINICAL DATA:  70 year old male with BPH and obstructive symptoms  EXAM: RENAL / URINARY TRACT ULTRASOUND  COMPLETE  COMPARISON:  None.  FINDINGS: Right Kidney:  Length: 12.3 cm x 6.3 cm x 6.2 cm, 251 cc. No hydronephrosis. Echogenicity unremarkable. Flow in the hilum of the right kidney confirmed. Anechoic cystic structure with through transmission measures 1.5 cm x 1.0 cm x 1.9 cm.  Left Kidney:  Length: 12.3 cm x 6.9 cm x 6.7 cm, 296 cc. Echogenicity within normal limits. No hydronephrosis. Flow confirmed in the left kidney. There is a small hyperechoic focus on the lateral cortex of the left kidney measuring 5 mm. No internal flow.  Bladder:  Urinary bladder incompletely distended.  Ureteral jets visualized.  IMPRESSION: No evidence of hydronephrosis.  Bosniak 1 cyst of the right kidney.  Hyperechoic focus on the lateral cortex of the left kidney measures 5 mm, potentially a small AML or pararenal fat within a lobulation.   Electronically Signed By: Corrie Mckusick D.O. On: 06/06/2020 13:31  No results found for this or any previous visit.  No results found for this or any previous visit.  Results for orders placed during the hospital encounter of 09/25/21  CT Renal Stone Study  Narrative CLINICAL DATA:  Abdominal pain, RIGHT lower abdominal and flank pain after eating the last 3-4 days, this morning completely over lower abdomen, question kidney stone, nausea since 0600 hours  EXAM: CT ABDOMEN AND PELVIS WITHOUT CONTRAST  TECHNIQUE: Multidetector CT imaging of the abdomen and pelvis was performed following the standard protocol without IV contrast. Sagittal and coronal MPR images reconstructed from axial data set. No oral contrast administered.  COMPARISON:  None  FINDINGS: Lower chest: Minimal subsegmental atelectasis at LEFT lower lobe. 4 mm RIGHT lower lobe nodule image 23.  Hepatobiliary: Multiple small calcified gallstones within gallbladder. No biliary dilatation or choledocholithiasis. Liver unremarkable.  Pancreas: Normal appearance  Spleen:  Normal appearance  Adrenals/Urinary Tract: Adrenal glands normal appearance. LEFT hydronephrosis and hydroureter secondary to a 4 mm distal LEFT ureteral calculus image 61. Mild RIGHT hydronephrosis and hydroureter secondary to a 2 mm distal RIGHT ureteral calculus image 68 just above ureterovesical junction. Mild perinephric edema at both kidneys. Bladder unremarkable.  Stomach/Bowel: Appendix not visualized. Minimal distal colonic diverticulosis without evidence of diverticulitis. Stomach and bowel loops otherwise normal appearance.  Vascular/Lymphatic: Atherosclerotic calcifications aorta and iliac arteries as well as femoral arteries and minimally in coronary arteries. Aorta normal caliber. No adenopathy. Few pelvic phleboliths. Retroaortic LEFT renal vein noted.  Reproductive: Unremarkable prostate gland and seminal vesicles  Other: Small BILATERAL inguinal hernias containing fat. No free air or free fluid.  Musculoskeletal: Prior lower lumbar fusion L4-S1 without incorporation of the disc prostheses. Prior laminectomies of L4 and L5. No acute osseous findings.  IMPRESSION: BILATERAL hydronephrosis and hydroureter secondary to a 4 mm distal LEFT ureteral calculus and a 2 mm distal RIGHT ureteral calculus just above ureterovesical junction.  Cholelithiasis.  Minimal distal colonic diverticulosis without evidence of diverticulitis.  Small BILATERAL inguinal hernias containing fat.  4 mm RIGHT lower lobe nodule; no follow-up needed if patient is low-risk. Non-contrast chest CT can be considered in 12 months if patient is high-risk. This recommendation follows the consensus statement: Guidelines for Management of Incidental Pulmonary Nodules Detected on CT Images: From the Fleischner Society 2017; Radiology 2017; 284:228-243.  Aortic Atherosclerosis (ICD10-I70.0).   Electronically Signed By: Elta Guadeloupe  Thornton Papas M.D. On: 09/25/2021 14:41   Assessment & Plan:    1.  Ureteral stone -We discussed the management of kidney stones. These options include observation, ureteroscopy, shockwave lithotripsy (ESWL) and percutaneous nephrolithotomy (PCNL). We discussed which options are relevant to the patient's stone(s). We discussed the natural history of kidney stones as well as the complications of untreated stones and the impact on quality of life without treatment as well as with each of the above listed treatments. We also discussed the efficacy of each treatment in its ability to clear the stone burden. With any of these management options I discussed the signs and symptoms of infection and the need for emergent treatment should these be experienced. For each option we discussed the ability of each procedure to clear the patient of their stone burden.   For observation I described the risks which include but are not limited to silent renal damage, life-threatening infection, need for emergent surgery, failure to pass stone and pain.   For ureteroscopy I described the risks which include bleeding, infection, damage to contiguous structures, positioning injury, ureteral stricture, ureteral avulsion, ureteral injury, need for prolonged ureteral stent, inability to perform ureteroscopy, need for an interval procedure, inability to clear stone burden, stent discomfort/pain, heart attack, stroke, pulmonary embolus and the inherent risks with general anesthesia.   For shockwave lithotripsy I described the risks which include arrhythmia, kidney contusion, kidney hemorrhage, need for transfusion, pain, inability to adequately break up stone, inability to pass stone fragments, Steinstrasse, infection associated with obstructing stones, need for alternate surgical procedure, need for repeat shockwave lithotripsy, MI, CVA, PE and the inherent risks with anesthesia/conscious sedation.   For PCNL I described the risks including positioning injury, pneumothorax, hydrothorax, need for  chest tube, inability to clear stone burden, renal laceration, arterial venous fistula or malformation, need for embolization of kidney, loss of kidney or renal function, need for repeat procedure, need for prolonged nephrostomy tube, ureteral avulsion, MI, CVA, PE and the inherent risks of general anesthesia.   - The patient would like to proceed with bilateral ureteroscopic stone extraction - Urinalysis, Routine w reflex microscopic    No follow-ups on file.  Nicolette Bang, MD  [facility] [place]

## 2021-09-26 NOTE — Progress Notes (Signed)
Urological Symptom Review  Patient is experiencing the following symptoms: Frequent urination Burning/pain with urination Get up at night to urinate Stream starts and stops Trouble starting stream Have to strain to urinate Blood in urine Weak stream Erection problems (male only)   Review of Systems  Gastrointestinal (upper)  : Nausea  Gastrointestinal (lower) : Constipation  Constitutional : Weight loss  Skin: Negative for skin symptoms  Eyes: Blurred vision  Ear/Nose/Throat : Sinus problems  Hematologic/Lymphatic: Easy bruising  Cardiovascular : Negative for cardiovascular symptoms  Respiratory : Negative for respiratory symptoms  Endocrine: Negative for endocrine symptoms  Musculoskeletal: Back pain  Neurological: Dizziness  Psychologic: Negative for psychiatric symptoms

## 2021-09-26 NOTE — Progress Notes (Signed)
09/26/2021 12:11 PM   [name] [name] July 04, 1951 [id number]  Referring provider: Tammi Sou, MD [address] [place],  [place] [address]  Ureteral calculus   HPI: Mr [name] [name] is a 70yo here for evaluation of ureteral calculi. Starting Monday he developed right flank which then progressed to severe right flank pain and he presented to the ER. He was diagnosed with a 81mm right distal ureteral calculus and a left 86mm ureteral calculus. Creatinine normal. He denies any left flank pain. He has moderate LUTS on flomax 0.4mg  daily. IPSS 20 QOL 5.    PMH: Past Medical History:  Diagnosis Date   Allergic rhinitis, unspecified    Allergy testing via allergist 04/2017--mostly neg.  Dr. Donneta Romberg recommended referral to ENT so i did this.   Aortic root dilatation ([place]) 2019   2019, 41 mm, 2022, 39 mm-->rpt 1 yr.   Back pain    LBP->DDD/spondylosis w/ intermittent radiulopathy-type leg pains, + bilat hip and thigh pain-->spinal stenosis L4-5, L5-S1->Dr. Vertell Limber to do decomp and fusion surgery, scheduled for 12/03/20   BPH (benign prostatic hypertrophy)    no hydro on renal u/s 05/2020   Chronic nasal congestion    DDD (degenerative disc disease), lumbar 2010   laminectomy 01/2009.  MR rpt 05/2019->advanced DDD/spondylosis L3-S1, with mod/sev L4-5 spinal stenosis-->plan for surg per Dr. Vertell Limber as of 09/2020   Depression    Hospitalized for suicidal ideation 1992.  Has been on lexapro since 10/2008.   Diabetes mellitus type II    Dx'd approximately 06/2008.  No retinopathy as of 02/2017 ophth.   Erectile dysfunction    Heart murmur, systolic 76/1607   mild intensity, asymptomatic; echo 01/2018 valves fine->obs   History of colon polyps 01/2016   Recall 3 yrs   History of pneumonia 2008   Hospitalized   History of scarlet fever    age 56   Hyperlipidemia, mixed 1993   myalgias on pravastatin   Hypertension 2009   "marked chronic cardiomegaly" on CXR 01/2009 per old records.    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 Delice Lesch   MRI brain 03/01/20 NORMAL   OSA on CPAP 2004; 2019   Back on CPAP 04/2018.  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  02/21/16: tubular adenoma.  Recall 3 yrs (Dr. Ardis Hughs).   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 Vertell Limber)   [name] [name]     age 88   TRANSTHORACIC ECHOCARDIOGRAM  02/22/2018   EF 55-60%, grd I DD. Aortic root dilation 41 mm.  05/2021 echo unchanged except aortic root dilation down to 39 mm. Plan rpt  1 yr to monitor aortic root dilation.   URETHRAL DILATION     2 as a child, one as an adult   VASECTOMY  1994   VITRECTOMY      Home Medications:  Allergies as of 09/26/2021       Reactions   Simvastatin Other (See Comments)   myalgias   Antihistamines, Diphenhydramine-type Other (See Comments)   depression   Codeine Nausea Only   Penicillins Hives   Sulfa Antibiotics Hives        Medication List        Accurate as of September 26, 2021 12:11 PM. If you have any questions, ask your nurse or doctor.          STOP taking these medications    Vitamin D3 125 MCG (5000 UT) Caps       TAKE these medications    aspirin 81 MG EC tablet Take 1 tablet (81 mg total) by mouth daily.   atorvastatin 80 MG  tablet Commonly known as: LIPITOR TAKE 1 TABLET(80 MG) BY MOUTH DAILY   azelastine 0.1 % nasal spray Commonly known as: ASTELIN Place 2 sprays into both nostrils 2 (two) times daily. Use in each nostril as directed What changed: additional instructions   BD Pen Needle Nano 2nd Gen 32G X 4 MM Misc Generic drug: Insulin Pen Needle USE TO INJECT 4 TIMES DAILY   buPROPion 150 MG 12 hr tablet Commonly known as: WELLBUTRIN SR Take 1 tablet (150 mg total) by mouth 2 (two) times daily.   escitalopram 20 MG tablet Commonly known as: LEXAPRO TAKE 1 TABLET(20 MG) BY MOUTH DAILY   Fenofibric Acid 135 MG Cpdr TAKE 1 CAPSULE BY MOUTH DAILY   finasteride 5 MG tablet Commonly known as: PROSCAR TAKE 1 TABLET(5 MG) BY MOUTH DAILY   fluticasone 50 MCG/ACT nasal spray Commonly known as: FLONASE Place 2 sprays into both nostrils daily. What changed:  when to take this reasons to take this   gabapentin 300 MG capsule Commonly known as: NEURONTIN Take 900 mg by mouth at bedtime.   glucose blood test strip Per Insurance Preference- Test sugars up to three times daily DX E11.9   glucose blood test strip Commonly known as: ONE TOUCH ULTRA TEST 1 each by Other route 3 (three) times daily. Test sugars up to three times daily. ( insurance preference  DX E11.65 E11.9)   HYDROcodone-acetaminophen 5-325 MG tablet Commonly known as: NORCO/VICODIN Take 1 tablet by mouth every 6 (six) hours as needed.   Krill Oil 500 MG Caps Take 500 mg by mouth daily.   Levemir FlexTouch 100 UNIT/ML FlexPen Generic drug: insulin detemir Inject 50 Units into the skin every evening.   metFORMIN 1000 MG tablet Commonly known as: GLUCOPHAGE TAKE 1 TABLET BY MOUTH TWICE DAILY WITH A MEAL   methocarbamol 500 MG tablet Commonly known as: ROBAXIN Take by mouth as needed.   nitrofurantoin (macrocrystal-monohydrate) 100 MG capsule Commonly known as: MACROBID Take 1 capsule (100 mg total) by mouth 2 (two) times  daily.   ondansetron 4 MG tablet Commonly known as: ZOFRAN Take 1 tablet (4 mg total) by mouth every 8 (eight) hours as needed for nausea or vomiting.   oxyCODONE-acetaminophen 5-325 MG tablet Commonly known as: PERCOCET/ROXICET Take 1 tablet by mouth every 8 (eight) hours as needed for up to 3 days for severe pain.   Ozempic (2 MG/DOSE) 8 MG/3ML Sopn Generic drug: Semaglutide (2 MG/DOSE) Inject 2 mg into the skin once  a week.   sodium chloride 0.65 % nasal spray Commonly known as: OCEAN Place 1 spray into the nose daily as needed.   tamsulosin 0.4 MG Caps capsule Commonly known as: FLOMAX Take 1 capsule (0.4 mg total) by mouth once for 1 dose.   vitamin B-12 1000 MCG tablet Commonly known as: CYANOCOBALAMIN Take 2,000 mcg by mouth daily.        Allergies:  Allergies  Allergen Reactions   Simvastatin Other (See Comments)    myalgias   Antihistamines, Diphenhydramine-Type Other (See Comments)    depression   Codeine Nausea Only   Penicillins Hives   Sulfa Antibiotics Hives    Family History: Family History  Problem Relation Age of Onset   Heart disease Mother    Hyperlipidemia Mother    Stroke Mother    Diabetes Mother    Depression Mother    Alzheimer's disease Mother    Stroke Father    Hyperlipidemia Father    Heart disease Father    Diabetes Father    Hypertension Father    Obesity Father    Alcohol abuse Sister    Depression Sister     Social History:  reports that he has quit smoking. He has never used smokeless tobacco. He reports current alcohol use. He reports that he does not use drugs.  ROS: All other review of systems were reviewed and are negative except what is noted above in HPI  Physical Exam: There were no vitals taken for this visit.  Constitutional:  Alert and oriented, No acute distress. HEENT: [place] AT, moist mucus membranes.  Trachea midline, no masses. Cardiovascular: No clubbing, cyanosis, or edema. Respiratory: Normal  respiratory effort, no increased work of breathing. GI: Abdomen is soft, nontender, nondistended, no abdominal masses GU: No CVA tenderness.  Lymph: No cervical or inguinal lymphadenopathy. Skin: No rashes, bruises or suspicious lesions. Neurologic: Grossly intact, no focal deficits, moving all 4 extremities. Psychiatric: Normal mood and affect.  Laboratory Data: Lab Results  Component Value Date   WBC 7.2 09/25/2021   HGB 12.9 (L) 09/25/2021   HCT 38.2 (L) 09/25/2021   MCV 89.0 09/25/2021   PLT 263 09/25/2021    Lab Results  Component Value Date   CREATININE 1.31 (H) 09/25/2021    Lab Results  Component Value Date   PSA 0.02 (L) 05/22/2021   PSA 0.08 (L) 05/16/2020   PSA 0.04 (L) 05/19/2019    Lab Results  Component Value Date   TESTOSTERONE 87.17 (L) 04/13/2012    Lab Results  Component Value Date   HGBA1C 6.0 (H) 09/15/2021    Urinalysis    Component Value Date/Time   COLORURINE AMBER (A) 09/25/2021 1137   APPEARANCEUR CLOUDY (A) 09/25/2021 1137   LABSPEC 1.013 09/25/2021 1137   PHURINE 6.0 09/25/2021 1137   GLUCOSEU NEGATIVE 09/25/2021 1137   HGBUR MODERATE (A) 09/25/2021 1137   BILIRUBINUR NEGATIVE 09/25/2021 1137   KETONESUR NEGATIVE 09/25/2021 1137   PROTEINUR 100 (A) 09/25/2021 1137   NITRITE NEGATIVE 09/25/2021 1137   LEUKOCYTESUR NEGATIVE 09/25/2021 1137    Lab Results  Component Value Date   LABMICR <3.0 12/12/2019   BACTERIA NONE SEEN 09/25/2021    Pertinent Imaging: CT stone study 10/27: Images reviewed and discussed with the patient  No results found for this or any previous visit.  No results found for this or any previous visit.  No results found for this or any previous visit.  No results found for this or any previous  visit.  Results for orders placed in visit on 05/16/20  US RENAL  Narrative CLINICAL DATA:  70 year old male with BPH and obstructive symptoms  EXAM: RENAL / URINARY TRACT ULTRASOUND  COMPLETE  COMPARISON:  None.  FINDINGS: Right Kidney:  Length: 12.3 cm x 6.3 cm x 6.2 cm, 251 cc. No hydronephrosis. Echogenicity unremarkable. Flow in the hilum of the right kidney confirmed. Anechoic cystic structure with through transmission measures 1.5 cm x 1.0 cm x 1.9 cm.  Left Kidney:  Length: 12.3 cm x 6.9 cm x 6.7 cm, 296 cc. Echogenicity within normal limits. No hydronephrosis. Flow confirmed in the left kidney. There is a small hyperechoic focus on the lateral cortex of the left kidney measuring 5 mm. No internal flow.  Bladder:  Urinary bladder incompletely distended.  Ureteral jets visualized.  IMPRESSION: No evidence of hydronephrosis.  Bosniak 1 cyst of the right kidney.  Hyperechoic focus on the lateral cortex of the left kidney measures 5 mm, potentially a small AML or pararenal fat within a lobulation.   Electronically Signed By: Corrie Mckusick D.O. On: 06/06/2020 13:31  No results found for this or any previous visit.  No results found for this or any previous visit.  Results for orders placed during the hospital encounter of 09/25/21  CT Renal Stone Study  Narrative CLINICAL DATA:  Abdominal pain, RIGHT lower abdominal and flank pain after eating the last 3-4 days, this morning completely over lower abdomen, question kidney stone, nausea since 0600 hours  EXAM: CT ABDOMEN AND PELVIS WITHOUT CONTRAST  TECHNIQUE: Multidetector CT imaging of the abdomen and pelvis was performed following the standard protocol without IV contrast. Sagittal and coronal MPR images reconstructed from axial data set. No oral contrast administered.  COMPARISON:  None  FINDINGS: Lower chest: Minimal subsegmental atelectasis at LEFT lower lobe. 4 mm RIGHT lower lobe nodule image 23.  Hepatobiliary: Multiple small calcified gallstones within gallbladder. No biliary dilatation or choledocholithiasis. Liver unremarkable.  Pancreas: Normal appearance  Spleen:  Normal appearance  Adrenals/Urinary Tract: Adrenal glands normal appearance. LEFT hydronephrosis and hydroureter secondary to a 4 mm distal LEFT ureteral calculus image 61. Mild RIGHT hydronephrosis and hydroureter secondary to a 2 mm distal RIGHT ureteral calculus image 68 just above ureterovesical junction. Mild perinephric edema at both kidneys. Bladder unremarkable.  Stomach/Bowel: Appendix not visualized. Minimal distal colonic diverticulosis without evidence of diverticulitis. Stomach and bowel loops otherwise normal appearance.  Vascular/Lymphatic: Atherosclerotic calcifications aorta and iliac arteries as well as femoral arteries and minimally in coronary arteries. Aorta normal caliber. No adenopathy. Few pelvic phleboliths. Retroaortic LEFT renal vein noted.  Reproductive: Unremarkable prostate gland and seminal vesicles  Other: Small BILATERAL inguinal hernias containing fat. No free air or free fluid.  Musculoskeletal: Prior lower lumbar fusion L4-S1 without incorporation of the disc prostheses. Prior laminectomies of L4 and L5. No acute osseous findings.  IMPRESSION: BILATERAL hydronephrosis and hydroureter secondary to a 4 mm distal LEFT ureteral calculus and a 2 mm distal RIGHT ureteral calculus just above ureterovesical junction.  Cholelithiasis.  Minimal distal colonic diverticulosis without evidence of diverticulitis.  Small BILATERAL inguinal hernias containing fat.  4 mm RIGHT lower lobe nodule; no follow-up needed if patient is low-risk. Non-contrast chest CT can be considered in 12 months if patient is high-risk. This recommendation follows the consensus statement: Guidelines for Management of Incidental Pulmonary Nodules Detected on CT Images: From the Fleischner Society 2017; Radiology 2017; 284:228-243.  Aortic Atherosclerosis (ICD10-I70.0).   Electronically Signed By: Elta Guadeloupe  Thornton Papas M.D. On: 09/25/2021 14:41   Assessment & Plan:    1.  Ureteral stone -We discussed the management of kidney stones. These options include observation, ureteroscopy, shockwave lithotripsy (ESWL) and percutaneous nephrolithotomy (PCNL). We discussed which options are relevant to the patient's stone(s). We discussed the natural history of kidney stones as well as the complications of untreated stones and the impact on quality of life without treatment as well as with each of the above listed treatments. We also discussed the efficacy of each treatment in its ability to clear the stone burden. With any of these management options I discussed the signs and symptoms of infection and the need for emergent treatment should these be experienced. For each option we discussed the ability of each procedure to clear the patient of their stone burden.   For observation I described the risks which include but are not limited to silent renal damage, life-threatening infection, need for emergent surgery, failure to pass stone and pain.   For ureteroscopy I described the risks which include bleeding, infection, damage to contiguous structures, positioning injury, ureteral stricture, ureteral avulsion, ureteral injury, need for prolonged ureteral stent, inability to perform ureteroscopy, need for an interval procedure, inability to clear stone burden, stent discomfort/pain, heart attack, stroke, pulmonary embolus and the inherent risks with general anesthesia.   For shockwave lithotripsy I described the risks which include arrhythmia, kidney contusion, kidney hemorrhage, need for transfusion, pain, inability to adequately break up stone, inability to pass stone fragments, Steinstrasse, infection associated with obstructing stones, need for alternate surgical procedure, need for repeat shockwave lithotripsy, MI, CVA, PE and the inherent risks with anesthesia/conscious sedation.   For PCNL I described the risks including positioning injury, pneumothorax, hydrothorax, need for  chest tube, inability to clear stone burden, renal laceration, arterial venous fistula or malformation, need for embolization of kidney, loss of kidney or renal function, need for repeat procedure, need for prolonged nephrostomy tube, ureteral avulsion, MI, CVA, PE and the inherent risks of general anesthesia.   - The patient would like to proceed with bilateral ureteroscopic stone extraction - Urinalysis, Routine w reflex microscopic    No follow-ups on file.  Nicolette Bang, MD  [facility] [place]

## 2021-09-26 NOTE — Patient Instructions (Signed)
Ureteroscopy Ureteroscopy is a procedure to check for and treat problems inside part of the urinary tract. In this procedure, a thin, flexible tube with a light at the end (ureteroscope) is used to look at the inside of the kidneys and the ureters. The ureters are the tubes that carry urine from the kidneys to the bladder. The ureteroscope is inserted into one or both of the ureters. You may need this procedure if you have frequent urinary tract infections (UTIs), blood in your urine, or a stone in one of your ureters. A ureteroscopy can be done: To find the cause of urine blockage in a ureter and to evaluate other abnormalities inside the ureters or kidneys. To remove stones. To remove or treat growths of tissue (polyps), abnormal tissue, and some types of tumors. To remove a tissue sample and check it for disease under a microscope (biopsy). Tell a health care provider about: Any allergies you have. All medicines you are taking, including vitamins, herbs, eye drops, creams, and over-the-counter medicines. Any problems you or family members have had with anesthetic medicines. Any blood disorders you have. Any surgeries you have had. Any medical conditions you have. Whether you are pregnant or may be pregnant. What are the risks? Generally, this is a safe procedure. However, problems may occur, including: Bleeding. Infection. Allergic reactions to medicines. Scarring that narrows the ureter (stricture). Creating a hole in the ureter (perforation). What happens before the procedure? Staying hydrated Follow instructions from your health care provider about hydration, which may include: Up to 2 hours before the procedure - you may continue to drink clear liquids, such as water, clear fruit juice, black coffee, and plain tea.  Eating and drinking restrictions Follow instructions from your health care provider about eating and drinking, which may include: 8 hours before the procedure - stop  eating heavy meals or foods, such as meat, fried foods, or fatty foods. 6 hours before the procedure - stop eating light meals or foods, such as toast or cereal. 6 hours before the procedure - stop drinking milk or drinks that contain milk. 2 hours before the procedure - stop drinking clear liquids. Medicines Ask your health care provider about: Changing or stopping your regular medicines. This is especially important if you are taking diabetes medicines or blood thinners. Taking medicines such as aspirin and ibuprofen. These medicines can thin your blood. Do not take these medicines unless your health care provider tells you to take them. Taking over-the-counter medicines, vitamins, herbs, and supplements. General instructions Do not use any products that contain nicotine or tobacco for at least 4 weeks before the procedure. These products include cigarettes, e-cigarettes, and chewing tobacco. If you need help quitting, ask your health care provider. You may have a urine sample taken to check for infection. Plan to have someone take you home from the hospital or clinic. If you will be going home right after the procedure, plan to have someone with you for 24 hours. Ask your health care provider what steps will be taken to help prevent infection. These may include: Washing skin with a germ-killing soap. Receiving antibiotic medicine. What happens during the procedure?  An IV will be inserted into one of your veins. You will be given one or more of the following: A medicine to help you relax (sedative). A medicine to make you fall asleep (general anesthetic). A medicine that is injected into your spine to numb the area below and slightly above the injection site (spinal anesthetic). The  part of your body that drains urine from your bladder (urethra) will be cleaned with a germ-killing solution. The ureteroscope will be passed through your urethra into your bladder. A salt-water solution will  be sent through the ureteroscope to fill your bladder. This will help the health care provider see the openings of your ureters more clearly. The ureteroscope will be passed into your ureter. If a growth is found, a biopsy may be done. If a stone is found, it may be removed through the ureteroscope, or the stone may be broken up using a laser, shock waves, or electrical energy. In some cases, if the ureter is too small, a tube may be inserted that keeps the ureter open (ureteral stent). The stent may be left in place for 1 or 2 weeks to keep the ureter open, and then the ureteroscopy procedure will be done. The scope will be removed, and your bladder will be emptied. The procedure may vary among health care providers and hospitals. What can I expect after the procedure? After your procedure, it is common to have: Your blood pressure, heart rate, breathing rate, and blood oxygen level monitored until you leave the hospital or clinic. A burning sensation when you urinate. You may be asked to urinate. Blood in your urine. Mild discomfort in your bladder area or kidney area when urinating. A need to urinate more often or urgently. Follow these instructions at home: Medicines Take over-the-counter and prescription medicines only as told by your health care provider. If you were prescribed an antibiotic medicine, take it as told by your health care provider. Do not stop taking the antibiotic even if you start to feel better. General instructions  If you were given a sedative during the procedure, it can affect you for several hours. Do not drive or operate machinery until your health care provider says that it is safe. To relieve burning, take a warm bath or hold a warm washcloth over your groin. Drink enough fluid to keep your urine pale yellow. Drink two 8-ounce (237 mL) glasses of water every hour for the first 2 hours after you get home. Continue to drink water often at home. You can eat what  you normally do. Keep all follow-up visits as told by your health care provider. This is important. If you had a ureteral stent placed, ask your health care provider when you need to return to have it removed. Contact a health care provider if you have: Chills or a fever. Burning pain for longer than 24 hours after the procedure. Blood in your urine for longer than 24 hours after the procedure. Get help right away if you have: Large amounts of blood in your urine. Blood clots in your urine. Severe pain. Chest pain or trouble breathing. The feeling of a full bladder and you are unable to urinate. These symptoms may represent a serious problem that is an emergency. Do not wait to see if the symptoms will go away. Get medical help right away. Call your local emergency services (911 in the [place] Summary Ureteroscopy is a procedure to check for and treat problems inside part of the urinary tract. In this procedure, a thin, flexible tube with a light at the end (ureteroscope) is used to look at the inside of the kidneys and the ureters. You may need this procedure if you have frequent urinary tract infections (UTIs), blood in your urine, or a stone in a ureter. This information is not intended to replace advice given to  you by your health care provider. Make sure you discuss any questions you have with your health care provider. Document Revised: 08/23/2019 Document Reviewed: 08/23/2019 Elsevier Patient Education  [employer].

## 2021-09-29 ENCOUNTER — Encounter (HOSPITAL_COMMUNITY): Payer: Self-pay | Admitting: Urology

## 2021-09-29 ENCOUNTER — Encounter (HOSPITAL_COMMUNITY)
Admission: RE | Disposition: A | Discharge status: Home / Self Care | Payer: Self-pay | Source: Ambulatory Visit | Attending: Urology

## 2021-09-29 ENCOUNTER — Ambulatory Visit (HOSPITAL_COMMUNITY): Payer: Medicare Other | Admitting: Certified Registered Nurse Anesthetist

## 2021-09-29 ENCOUNTER — Ambulatory Visit (HOSPITAL_COMMUNITY)
Admission: RE | Admit: 2021-09-29 | Discharge: 2021-09-29 | Disposition: A | Discharge status: Home / Self Care | Payer: Medicare Other | Source: Ambulatory Visit | Attending: Urology | Admitting: Urology

## 2021-09-29 ENCOUNTER — Ambulatory Visit (HOSPITAL_COMMUNITY): Payer: Medicare Other

## 2021-09-29 DIAGNOSIS — Z7982 Long term (current) use of aspirin: Secondary | ICD-10-CM | POA: Diagnosis not present

## 2021-09-29 DIAGNOSIS — E119 Type 2 diabetes mellitus without complications: Secondary | ICD-10-CM | POA: Insufficient documentation

## 2021-09-29 DIAGNOSIS — N401 Enlarged prostate with lower urinary tract symptoms: Secondary | ICD-10-CM | POA: Diagnosis not present

## 2021-09-29 DIAGNOSIS — Z87891 Personal history of nicotine dependence: Secondary | ICD-10-CM | POA: Diagnosis not present

## 2021-09-29 DIAGNOSIS — Z880 Allergy status to penicillin: Secondary | ICD-10-CM | POA: Insufficient documentation

## 2021-09-29 DIAGNOSIS — Z79899 Other long term (current) drug therapy: Secondary | ICD-10-CM | POA: Diagnosis not present

## 2021-09-29 DIAGNOSIS — Z882 Allergy status to sulfonamides status: Secondary | ICD-10-CM | POA: Insufficient documentation

## 2021-09-29 DIAGNOSIS — Z885 Allergy status to narcotic agent status: Secondary | ICD-10-CM | POA: Diagnosis not present

## 2021-09-29 DIAGNOSIS — Z794 Long term (current) use of insulin: Secondary | ICD-10-CM | POA: Insufficient documentation

## 2021-09-29 DIAGNOSIS — N132 Hydronephrosis with renal and ureteral calculous obstruction: Secondary | ICD-10-CM | POA: Insufficient documentation

## 2021-09-29 DIAGNOSIS — Z7984 Long term (current) use of oral hypoglycemic drugs: Secondary | ICD-10-CM | POA: Insufficient documentation

## 2021-09-29 DIAGNOSIS — N201 Calculus of ureter: Secondary | ICD-10-CM

## 2021-09-29 DIAGNOSIS — Z888 Allergy status to other drugs, medicaments and biological substances status: Secondary | ICD-10-CM | POA: Insufficient documentation

## 2021-09-29 DIAGNOSIS — N1330 Unspecified hydronephrosis: Secondary | ICD-10-CM

## 2021-09-29 HISTORY — PX: HOLMIUM LASER APPLICATION: SHX5852

## 2021-09-29 HISTORY — PX: CYSTOSCOPY WITH RETROGRADE PYELOGRAM, URETEROSCOPY AND STENT PLACEMENT: SHX5789

## 2021-09-29 LAB — GLUCOSE, CAPILLARY
Glucose-Capillary: 92 mg/dL (ref 70–99)
Glucose-Capillary: 88 mg/dL (ref 70–99)

## 2021-09-29 SURGERY — CYSTOURETEROSCOPY, WITH RETROGRADE PYELOGRAM AND STENT INSERTION
Anesthesia: General | Laterality: Bilateral

## 2021-09-29 MED ORDER — SODIUM CHLORIDE 0.9 % IR SOLN
Status: DC | PRN
  Administered 2021-09-29: 3000 mL

## 2021-09-29 MED ORDER — WATER FOR IRRIGATION, STERILE IR SOLN
Status: DC | PRN
  Administered 2021-09-29: 1000 mL

## 2021-09-29 MED ORDER — ONDANSETRON HCL 4 MG/2ML IJ SOLN
4.0000 mg | Freq: Once | INTRAMUSCULAR | Status: AC | PRN
  Administered 2021-09-29: 4 mg via INTRAVENOUS

## 2021-09-29 MED ORDER — ORAL CARE MOUTH RINSE: 15.0000 mL | Freq: Once | OROMUCOSAL | Status: AC

## 2021-09-29 MED ORDER — FENTANYL CITRATE PF 50 MCG/ML IJ SOSY
PREFILLED_SYRINGE | INTRAMUSCULAR | Status: AC
  Filled 2021-09-29: qty 1

## 2021-09-29 MED ORDER — FENTANYL CITRATE (PF) 100 MCG/2ML IJ SOLN
INTRAMUSCULAR | Status: DC | PRN
  Administered 2021-09-29: 50 ug via INTRAVENOUS

## 2021-09-29 MED ORDER — MIDAZOLAM HCL 2 MG/2ML IJ SOLN
INTRAMUSCULAR | Status: AC
  Filled 2021-09-29: qty 2

## 2021-09-29 MED ORDER — FENTANYL CITRATE (PF) 100 MCG/2ML IJ SOLN
INTRAMUSCULAR | Status: AC
  Filled 2021-09-29: qty 2

## 2021-09-29 MED ORDER — LIDOCAINE 2% (20 MG/ML) 5 ML SYRINGE
INTRAMUSCULAR | Status: DC | PRN
  Administered 2021-09-29: 60 mg via INTRAVENOUS

## 2021-09-29 MED ORDER — OXYCODONE-ACETAMINOPHEN 5-325 MG PO TABS: 1.0000 | ORAL_TABLET | ORAL | 0 refills | Status: DC | PRN

## 2021-09-29 MED ORDER — PROPOFOL 10 MG/ML IV BOLUS
INTRAVENOUS | Status: DC | PRN
  Administered 2021-09-29: 120 mg via INTRAVENOUS

## 2021-09-29 MED ORDER — PROPOFOL 10 MG/ML IV BOLUS
INTRAVENOUS | Status: AC
  Filled 2021-09-29: qty 40

## 2021-09-29 MED ORDER — MIDAZOLAM HCL 2 MG/2ML IJ SOLN
INTRAMUSCULAR | Status: DC | PRN
  Administered 2021-09-29: 1 mg via INTRAVENOUS

## 2021-09-29 MED ORDER — DIATRIZOATE MEGLUMINE 30 % UR SOLN
URETHRAL | Status: DC | PRN
  Administered 2021-09-29: 20 mL via URETHRAL

## 2021-09-29 MED ORDER — CEFAZOLIN SODIUM-DEXTROSE 2-4 GM/100ML-% IV SOLN
2.0000 g | INTRAVENOUS | Status: AC
  Administered 2021-09-29: 2 g via INTRAVENOUS
  Filled 2021-09-29: qty 100

## 2021-09-29 MED ORDER — CHLORHEXIDINE GLUCONATE 0.12 % MT SOLN
15.0000 mL | Freq: Once | OROMUCOSAL | Status: AC
  Administered 2021-09-29: 15 mL via OROMUCOSAL

## 2021-09-29 MED ORDER — FENTANYL CITRATE PF 50 MCG/ML IJ SOSY
25.0000 ug | PREFILLED_SYRINGE | INTRAMUSCULAR | Status: DC | PRN
  Administered 2021-09-29 (×2): 50 ug via INTRAVENOUS

## 2021-09-29 MED ORDER — LACTATED RINGERS IV SOLN
INTRAVENOUS | Status: DC
  Administered 2021-09-29: 1000 mL via INTRAVENOUS

## 2021-09-29 MED ORDER — DIATRIZOATE MEGLUMINE 30 % UR SOLN
URETHRAL | Status: AC
  Filled 2021-09-29: qty 100

## 2021-09-29 SURGICAL SUPPLY — 24 items
BAG DRAIN URO TABLE W/ADPT NS (BAG) ×2 IMPLANT
BAG DRN 8 ADPR NS SKTRN CSTL (BAG) ×1
BAG HAMPER (MISCELLANEOUS) ×2 IMPLANT
CATH INTERMIT  6FR 70CM (CATHETERS) ×2 IMPLANT
CLOTH BEACON ORANGE TIMEOUT ST (SAFETY) ×2 IMPLANT
EXTRACTOR STONE NITINOL NGAGE (UROLOGICAL SUPPLIES) ×2 IMPLANT
GLOVE SURG POLYISO LF SZ8 (GLOVE) ×2 IMPLANT
GLOVE SURG UNDER POLY LF SZ7 (GLOVE) ×4 IMPLANT
GOWN STRL REUS W/TWL LRG LVL3 (GOWN DISPOSABLE) ×2 IMPLANT
GOWN STRL REUS W/TWL XL LVL3 (GOWN DISPOSABLE) ×2 IMPLANT
GUIDEWIRE STR DUAL SENSOR (WIRE) ×2 IMPLANT
GUIDEWIRE STR ZIPWIRE 035X150 (MISCELLANEOUS) ×2 IMPLANT
IV NS IRRIG 3000ML ARTHROMATIC (IV SOLUTION) ×4 IMPLANT
KIT TURNOVER CYSTO (KITS) ×2 IMPLANT
MANIFOLD NEPTUNE II (INSTRUMENTS) ×2 IMPLANT
PACK CYSTO (CUSTOM PROCEDURE TRAY) ×2 IMPLANT
PAD ARMBOARD 7.5X6 YLW CONV (MISCELLANEOUS) ×2 IMPLANT
STENT URET 6FRX26 CONTOUR (STENTS) ×2 IMPLANT
SYR 10ML LL (SYRINGE) ×2 IMPLANT
SYR CONTROL 10ML LL (SYRINGE) ×2 IMPLANT
TOWEL OR 17X26 4PK STRL BLUE (TOWEL DISPOSABLE) ×2 IMPLANT
TRACTIP FLEXIVA PULS ID 200XHI (Laser) ×1 IMPLANT
TRACTIP FLEXIVA PULSE ID 200 (Laser) ×2
WATER STERILE IRR 500ML POUR (IV SOLUTION) ×2 IMPLANT

## 2021-09-29 NOTE — Anesthesia Postprocedure Evaluation (Signed)
Anesthesia Post Note  Patient: ISAI GOTTLIEB  Procedure(s) Performed: CYSTOSCOPY WITH RETROGRADE PYELOGRAM, URETEROSCOPY AND STENT PLACEMENT (Bilateral) HOLMIUM LASER APPLICATION (Bilateral)  Patient location during evaluation: Phase II Anesthesia Type: General Level of consciousness: awake Pain management: pain level controlled Vital Signs Assessment: post-procedure vital signs reviewed and stable Respiratory status: spontaneous breathing and respiratory function stable Cardiovascular status: blood pressure returned to baseline and stable Postop Assessment: no headache and no apparent nausea or vomiting Anesthetic complications: no Comments: Late entry   No notable events documented.   Last Vitals:  Vitals:   09/29/21 1338 09/29/21 1345  BP: (!) 141/87 (!) 141/87  Pulse: 90 89  Resp:  (!) 21  Temp: 36.7 C   SpO2: 96% 94%    Last Pain:  Vitals:   09/29/21 1338  TempSrc:   PainSc: [place]

## 2021-09-29 NOTE — Anesthesia Procedure Notes (Signed)
Procedure Name: LMA Insertion Date/Time: 09/29/2021 12:54 PM Performed by: Lyda Jester, CRNA Pre-anesthesia Checklist: Patient identified, Patient being monitored, Emergency Drugs available, Timeout performed and Suction available Patient Re-evaluated:Patient Re-evaluated prior to induction Oxygen Delivery Method: Circle System Utilized Preoxygenation: Pre-oxygenation with 100% oxygen Induction Type: IV induction Ventilation: Mask ventilation without difficulty LMA: LMA inserted LMA Size: 5.0 Number of attempts: 1 Placement Confirmation: positive ETCO2 and breath sounds checked- equal and bilateral

## 2021-09-29 NOTE — Interval H&P Note (Signed)
History and Physical Interval Note:  09/29/2021 12:04 PM  [name] [name]  has presented today for surgery, with the diagnosis of bilateral ureteral calculi.  The various methods of treatment have been discussed with the patient and family. After consideration of risks, benefits and other options for treatment, the patient has consented to  Procedure(s): CYSTOSCOPY WITH RETROGRADE PYELOGRAM, URETEROSCOPY AND STENT PLACEMENT (Bilateral) HOLMIUM LASER APPLICATION (Bilateral) as a surgical intervention.  The patient's history has been reviewed, patient examined, no change in status, stable for surgery.  I have reviewed the patient's chart and labs.  Questions were answered to the patient's satisfaction.     Nicolette Bang

## 2021-09-29 NOTE — Op Note (Signed)
Preoperative diagnosis: bilateral ureteral stone  Postoperative diagnosis: Same  Procedure: 1 cystoscopy 2.  Bilateral retrograde pyelography 3.  Intraoperative fluoroscopy, under one hour, with interpretation 4.  Bilateral ureteroscopic stone manipulation with basket extraction and laser lithotripsy 5.  Left 6 x 26 JJ stent placement  Attending: Rosie Fate  Anesthesia: General  Estimated blood loss: None  Drains: left 6 x 26 JJ ureteral stent with tether  Specimens: stone for analysis  Antibiotics: ancef  Findings: bilateral distal ureteral stones. Moderate bilateral hydronephrosis. No masses/lesions in the bladder. Ureteral orifices in normal anatomic location.  Indications: Patient is a 70 year old male with a history of bilateral distal ureteral stone and elevated creatinine. After discussing treatment options, they decided proceed with bilateral ureteroscopic stone manipulation.  Procedure in detail: The patient was brought to the operating room and a brief timeout was done to ensure correct patient, correct procedure, correct site.  General anesthesia was administered patient was placed in dorsal lithotomy position.  Her genitalia was then prepped and draped in usual sterile fashion.  A rigid 24 French cystoscope was passed in the urethra and the bladder.  Bladder was inspected free masses or lesions.  the ureteral orifices were in the normal orthotopic locations. a 6 french ureteral catheter was then instilled into the left ureteral orifice.  a gentle retrograde was obtained and findings noted above. We then advanced a zipwire through the ureteral catheter and up to the real pelvis. We then removed the ureteral catheter.  we then removed the cystoscope and cannulated the left ureteral orifice with a semirigid ureteroscope.  We located the stone in the distal ureter and using a 242nm laser fiber the stone was fragments. The fragments removed it with an Patent examiner. We then  placed a 6 x 26 double-j ureteral stent over the original zip wire. We then removed the wire and good coil was noted in the the renal pelvis under fluoroscopy and the bladder under direct vision.  We then turned out attention to the right side. a 6 french ureteral catheter was then instilled into the right ureteral orifice.  a gentle retrograde was obtained and findings noted above.  A zipwire was then advanced through the catheter and up to the renal pelvis. The ureteral catheter was removed. we then removed the cystoscope and cannulated the right ureteral orifice with a semirigid ureteroscope.  We located the stone in the distal ureter which was removed from an NGage basket. We elected not to leave a stent since this was an uncomplicated ureteroscopy.  the bladder was then drained and this concluded the procedure which was well tolerated by patient.  Complications: None  Condition: Stable, extubated, transferred to PACU  Plan: Patient is to be discharged home as to follow-up in 2 weeks. He is to remove his stent in 72 hours by pulling the tether

## 2021-09-29 NOTE — Transfer of Care (Signed)
Immediate Anesthesia Transfer of Care Note  Patient: [name] [name]  Procedure(s) Performed: CYSTOSCOPY WITH RETROGRADE PYELOGRAM, URETEROSCOPY AND STENT PLACEMENT (Bilateral) HOLMIUM LASER APPLICATION (Bilateral)  Patient Location: PACU  Anesthesia Type:General  Level of Consciousness: awake, alert , oriented and patient cooperative  Airway & Oxygen Therapy: Patient Spontanous Breathing and Patient connected to nasal cannula oxygen  Post-op Assessment: Report given to RN, Post -op Vital signs reviewed and stable and Patient moving all extremities X 4  Post vital signs: Reviewed and stable  Last Vitals:  Vitals Value Taken Time  BP    Temp    Pulse    Resp 15 09/29/21 1340  SpO2    Vitals shown include unvalidated device data.  Last Pain:  Vitals:   09/29/21 1200  TempSrc: Oral  PainSc: 0-No pain      Patients Stated Pain Goal: 8 (16/58/00 6349)  Complications: No notable events documented.

## 2021-09-29 NOTE — Anesthesia Preprocedure Evaluation (Signed)
Anesthesia Evaluation  Patient identified by MRN, date of birth, ID band Patient awake    Reviewed: Allergy & Precautions, H&P , NPO status , Patient's Chart, lab work & pertinent test results, reviewed documented beta blocker date and time   Airway Mallampati: II  TM Distance: >3 FB Neck ROM: full    Dental no notable dental hx.    Pulmonary sleep apnea , former smoker,    Pulmonary exam normal breath sounds clear to auscultation       Cardiovascular Exercise Tolerance: Good hypertension, negative cardio ROS   Rhythm:regular Rate:Normal     Neuro/Psych PSYCHIATRIC DISORDERS Depression  Neuromuscular disease    GI/Hepatic negative GI ROS, Neg liver ROS,   Endo/Other  negative endocrine ROSdiabetes  Renal/GU negative Renal ROS  negative genitourinary   Musculoskeletal   Abdominal   Peds  Hematology negative hematology ROS (+)   Anesthesia Other Findings   Reproductive/Obstetrics negative OB ROS                             Anesthesia Physical Anesthesia Plan  ASA: 2  Anesthesia Plan: General   Post-op Pain Management:    Induction:   PONV Risk Score and Plan: Ondansetron  Airway Management Planned:   Additional Equipment:   Intra-op Plan:   Post-operative Plan:   Informed Consent: I have reviewed the patients History and Physical, chart, labs and discussed the procedure including the risks, benefits and alternatives for the proposed anesthesia with the patient or authorized representative who has indicated his/her understanding and acceptance.     Dental Advisory Given  Plan Discussed with: CRNA  Anesthesia Plan Comments:         Anesthesia Quick Evaluation

## 2021-09-30 ENCOUNTER — Emergency Department (HOSPITAL_COMMUNITY)
Admission: EM | Admit: 2021-09-30 | Discharge: 2021-09-30 | Disposition: A | Discharge status: Home / Self Care | Payer: Medicare Other | Attending: Emergency Medicine | Admitting: Emergency Medicine

## 2021-09-30 ENCOUNTER — Emergency Department (HOSPITAL_COMMUNITY): Payer: Medicare Other

## 2021-09-30 ENCOUNTER — Other Ambulatory Visit: Payer: Self-pay

## 2021-09-30 ENCOUNTER — Telehealth: Payer: Self-pay

## 2021-09-30 ENCOUNTER — Encounter (HOSPITAL_COMMUNITY): Payer: Self-pay | Admitting: Urology

## 2021-09-30 DIAGNOSIS — Z794 Long term (current) use of insulin: Secondary | ICD-10-CM | POA: Insufficient documentation

## 2021-09-30 DIAGNOSIS — R1032 Left lower quadrant pain: Secondary | ICD-10-CM | POA: Insufficient documentation

## 2021-09-30 DIAGNOSIS — E119 Type 2 diabetes mellitus without complications: Secondary | ICD-10-CM | POA: Insufficient documentation

## 2021-09-30 DIAGNOSIS — R1031 Right lower quadrant pain: Secondary | ICD-10-CM | POA: Diagnosis not present

## 2021-09-30 DIAGNOSIS — K5730 Diverticulosis of large intestine without perforation or abscess without bleeding: Secondary | ICD-10-CM | POA: Diagnosis not present

## 2021-09-30 DIAGNOSIS — Z960 Presence of urogenital implants: Secondary | ICD-10-CM | POA: Diagnosis not present

## 2021-09-30 DIAGNOSIS — I10 Essential (primary) hypertension: Secondary | ICD-10-CM | POA: Diagnosis not present

## 2021-09-30 DIAGNOSIS — Z7982 Long term (current) use of aspirin: Secondary | ICD-10-CM | POA: Diagnosis not present

## 2021-09-30 DIAGNOSIS — R10A0 Flank pain, unspecified side: Secondary | ICD-10-CM

## 2021-09-30 DIAGNOSIS — R109 Unspecified abdominal pain: Secondary | ICD-10-CM

## 2021-09-30 DIAGNOSIS — N201 Calculus of ureter: Secondary | ICD-10-CM | POA: Diagnosis not present

## 2021-09-30 DIAGNOSIS — R110 Nausea: Secondary | ICD-10-CM | POA: Diagnosis not present

## 2021-09-30 DIAGNOSIS — Z7984 Long term (current) use of oral hypoglycemic drugs: Secondary | ICD-10-CM | POA: Diagnosis not present

## 2021-09-30 DIAGNOSIS — K8020 Calculus of gallbladder without cholecystitis without obstruction: Secondary | ICD-10-CM | POA: Diagnosis not present

## 2021-09-30 DIAGNOSIS — N1330 Unspecified hydronephrosis: Secondary | ICD-10-CM | POA: Diagnosis not present

## 2021-09-30 DIAGNOSIS — Z87891 Personal history of nicotine dependence: Secondary | ICD-10-CM | POA: Insufficient documentation

## 2021-09-30 LAB — CBC
HCT: 37.8 % — ABNORMAL LOW (ref 39.0–52.0)
Hemoglobin: 12.8 g/dL — ABNORMAL LOW (ref 13.0–17.0)
MCH: 29.5 pg (ref 26.0–34.0)
MCHC: 33.9 g/dL (ref 30.0–36.0)
MCV: 87.1 fL (ref 80.0–100.0)
Platelets: 303 10*3/uL (ref 150–400)
RBC: 4.34 MIL/uL (ref 4.22–5.81)
RDW: 13.1 % (ref 11.5–15.5)
WBC: 9.3 10*3/uL (ref 4.0–10.5)
nRBC: 0 % (ref 0.0–0.2)

## 2021-09-30 LAB — COMPREHENSIVE METABOLIC PANEL
ALT: 15 U/L (ref 0–44)
AST: 16 U/L (ref 15–41)
Albumin: 4.1 g/dL (ref 3.5–5.0)
Alkaline Phosphatase: 31 U/L — ABNORMAL LOW (ref 38–126)
Anion gap: 8 (ref 5–15)
BUN: 20 mg/dL (ref 8–23)
CO2: 27 mmol/L (ref 22–32)
Calcium: 9.6 mg/dL (ref 8.9–10.3)
Chloride: 101 mmol/L (ref 98–111)
Creatinine, Ser: 1.45 mg/dL — ABNORMAL HIGH (ref 0.61–1.24)
GFR, Estimated: 52 mL/min — ABNORMAL LOW (ref >60)
Glucose, Bld: 140 mg/dL — ABNORMAL HIGH (ref 70–99)
Potassium: 3.5 mmol/L (ref 3.5–5.1)
Sodium: 136 mmol/L (ref 135–145)
Total Bilirubin: 0.8 mg/dL (ref 0.3–1.2)
Total Protein: 7.2 g/dL (ref 6.5–8.1)

## 2021-09-30 LAB — URINALYSIS, ROUTINE W REFLEX MICROSCOPIC
Bacteria, UA: NONE SEEN
Bilirubin Urine: NEGATIVE
Glucose, UA: NEGATIVE mg/dL
Ketones, ur: NEGATIVE mg/dL
Nitrite: NEGATIVE
Protein, ur: NEGATIVE mg/dL
RBC / HPF: >50 RBC/hpf — ABNORMAL HIGH (ref 0–5)
Specific Gravity, Urine: 1.004 — ABNORMAL LOW (ref 1.005–1.030)
pH: 6 (ref 5.0–8.0)

## 2021-09-30 IMAGING — CT CT RENAL STONE PROTOCOL
2 of 4 series · 16 of 46 positions shown, 18 images · non-contrast
Comparison: [DATE]

CLINICAL DATA: Bilateral flank pain, recent lithotripsy

EXAM:
CT ABDOMEN AND PELVIS WITHOUT CONTRAST
TECHNIQUE: Multidetector CT imaging of the abdomen and pelvis was performed
following the standard protocol without IV contrast.

[Series 2: axial st · axial · 0.88mm/px · z∈[+709,+1134]mm · 13 of 97 slices shown, 15 images]
[im 6/97  soft-tissue]
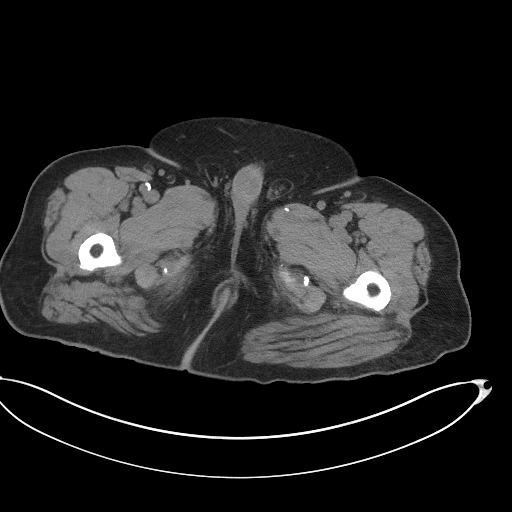
[im 6/97  bone]
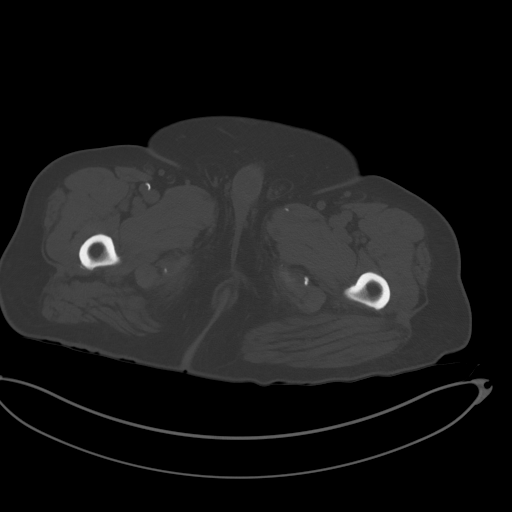
[im 16/97  soft-tissue]
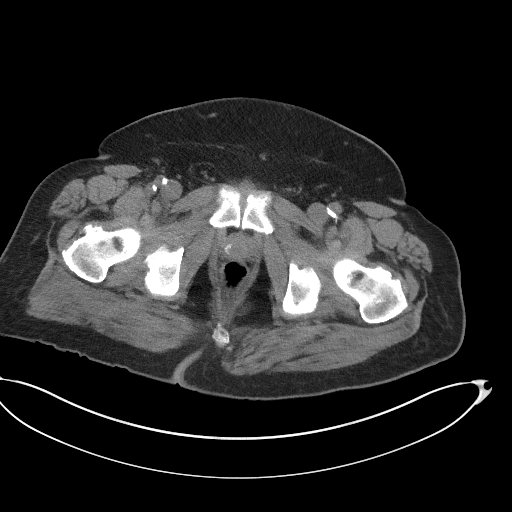
[im 21/97  soft-tissue]
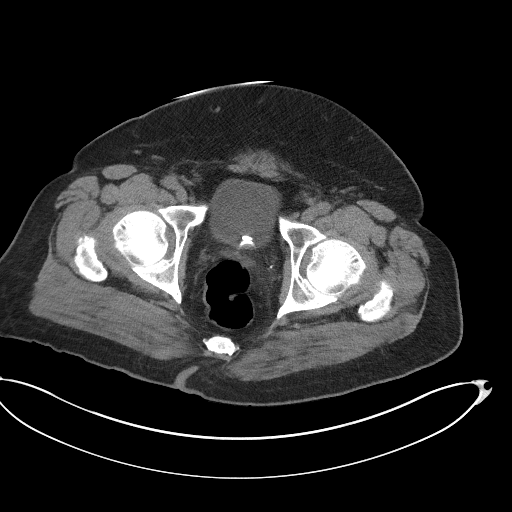
[im 26/97  soft-tissue]
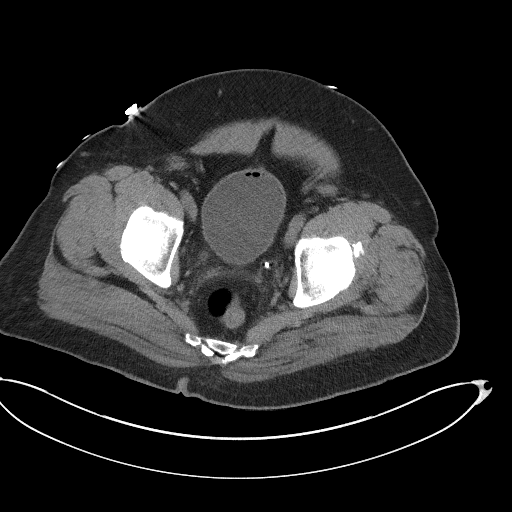
[im 36/97  soft-tissue]
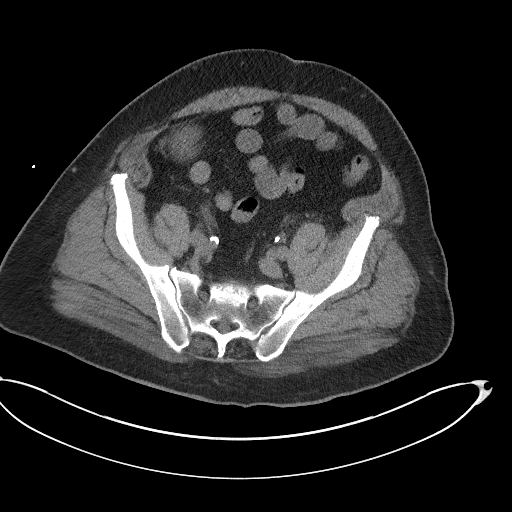
[im 41/97  soft-tissue]
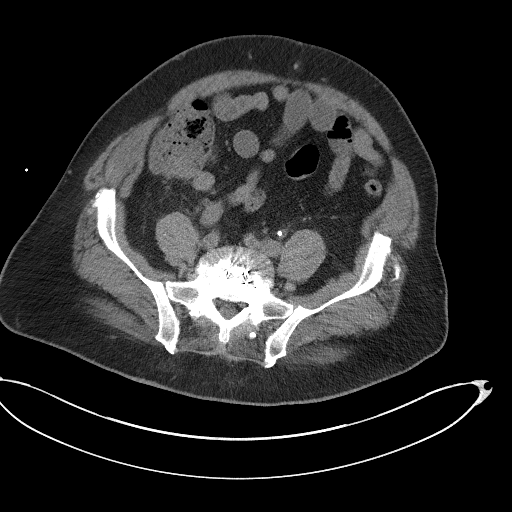
[im 51/97  soft-tissue]
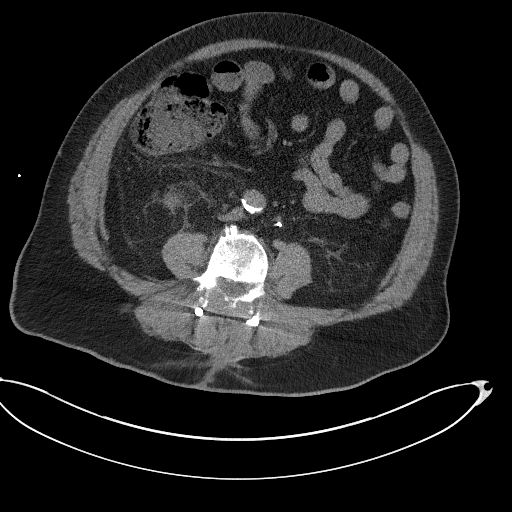
[im 56/97  soft-tissue]
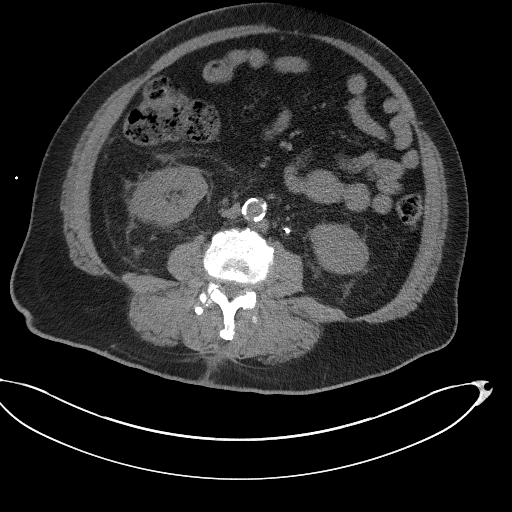
[im 61/97  soft-tissue]
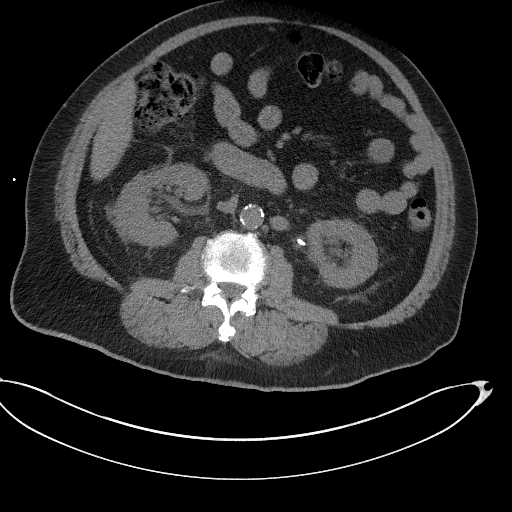
[im 61/97  bone]
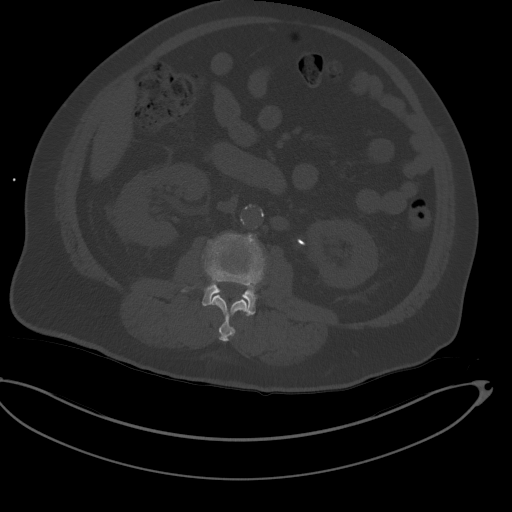
[im 71/97  soft-tissue]
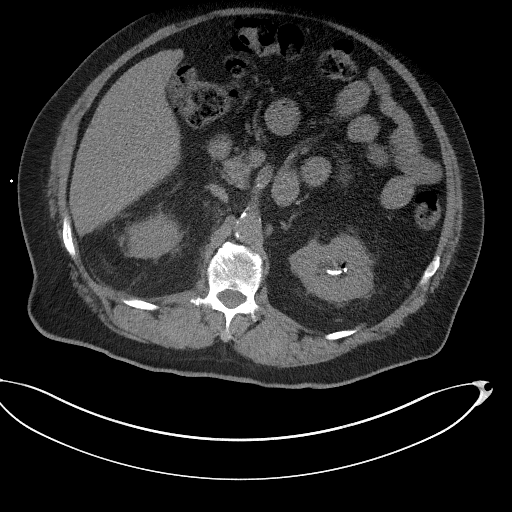
[im 76/97  soft-tissue]
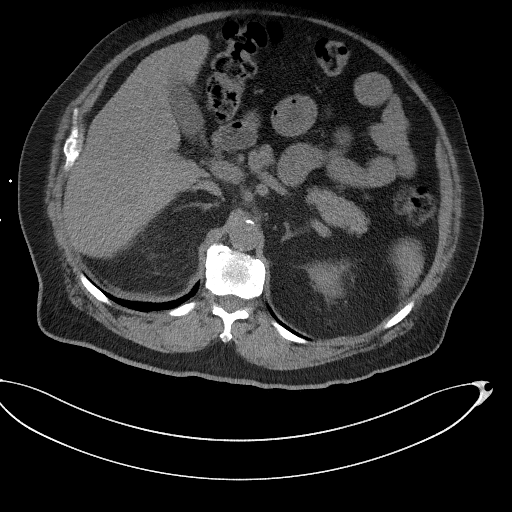
[im 81/97  soft-tissue]
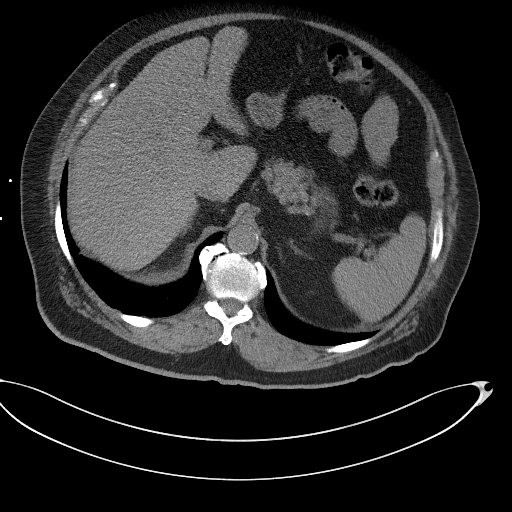
[im 91/97  soft-tissue]
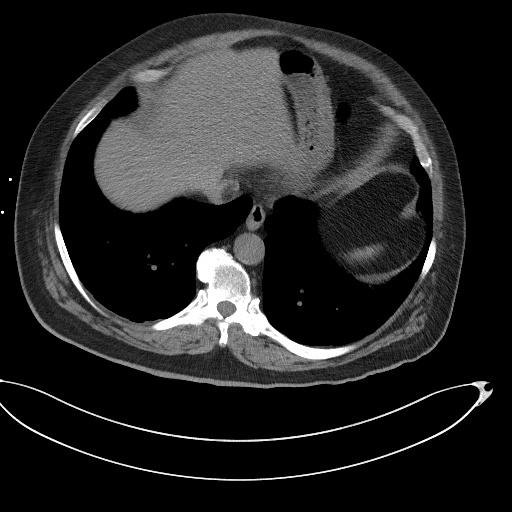

[Series 5: coronal st · coronal · 0.85mm/px · 3 of 124 slices shown]
[im 42/124  soft-tissue]
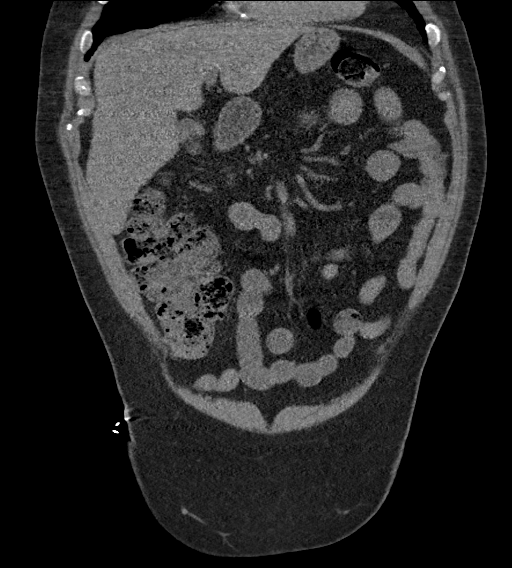
[im 55/124  soft-tissue]
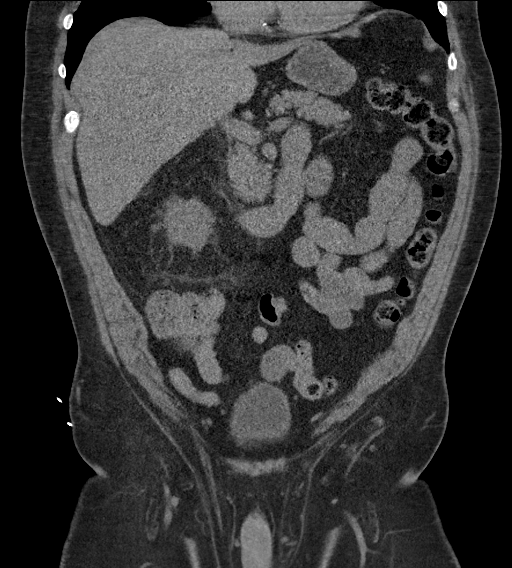
[im 69/124  soft-tissue]
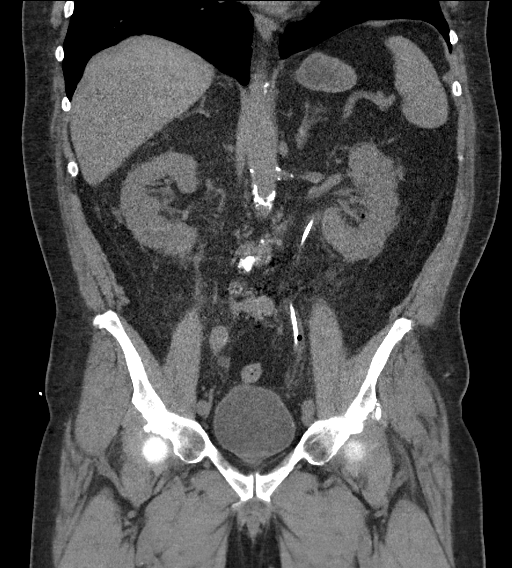

[16 of 46 positions shown; findings below may reference images not displayed]

FINDINGS: Lower chest: Small linear densities in the lower lung fields may
suggest scarring or minimal subsegmental atelectasis. 4 mm nodule
seen in the right lower lobe in the previous examination appears to
be obscured by small linear subsegmental atelectasis. There are
scattered coronary artery calcifications.

Hepatobiliary: No focal abnormality is seen in liver. Liver measures
18.2 cm in length. There are multiple calcified gallbladder stones.
There is no dilation of bile ducts.

Pancreas: No focal abnormality is seen.

Spleen: Unremarkable.

Adrenals/Urinary Tract: Adrenals are not enlarged. There is no
hydronephrosis. There is interval passage or removal of right
ureteral calculus. There is interval placement of left ureteral
stent. Calculus seen in the distal course of left ureter in the
previous study is not seen in the current study. In image 59, small
pocket of air is seen in the distal left ureter which may be related
to instrumentation. There is mild stranding adjacent to the renal
pelvis and ureters on both sides, possibly due to recent ureteric
obstruction and instrumentation. Possibility pyelonephritis is not
excluded. There are no demonstrable renal stones. Urinary bladder is
not distended. Small pockets of air in the urinary bladder most
likely related to recent instrumentation.

Stomach/Bowel: Stomach is unremarkable. Small bowel loops are not
dilated. Appendix is not distinctly seen. There is no focal
pericecal inflammation. There is no significant wall thickening in
colon. Few diverticula are seen.

Vascular/Lymphatic: There are scattered arterial calcifications. No
significant lymphadenopathy is seen.

Reproductive: Prostate appears smaller than usual in size.

Other: There is no ascites or pneumoperitoneum.

Musculoskeletal: There is previous surgical fusion from L4-S1
levels.
IMPRESSION: There is interval passage or removal of bilateral ureteral stones.
There is interval decrease in bilateral hydronephrosis. Left
ureteral stent is noted. There is stranding in the fat planes
adjacent to renal pelves and ureters on both sides which may be
related to recent passage of ureteral calculi and instrumentation.
Possibility of pyelonephritis is not excluded.

There is no evidence of intestinal obstruction or pneumoperitoneum.
Gallbladder stones. There is no dilation of bile ducts. Coronary
artery calcifications are seen. Diverticulosis of colon.

Other findings as described in the body of the report.

## 2021-09-30 MED ORDER — HYDROMORPHONE HCL 1 MG/ML IJ SOLN
1.0000 mg | Freq: Once | INTRAMUSCULAR | Status: AC
  Administered 2021-09-30: 1 mg via INTRAVENOUS
  Filled 2021-09-30: qty 1

## 2021-09-30 MED ORDER — HYDROMORPHONE HCL 2 MG PO TABS
2.0000 mg | ORAL_TABLET | Freq: Four times a day (QID) | ORAL | 0 refills | Status: DC | PRN
Start: 2021-09-30 — End: 2021-12-09

## 2021-09-30 MED ORDER — ONDANSETRON HCL 4 MG/2ML IJ SOLN
4.0000 mg | Freq: Once | INTRAMUSCULAR | Status: AC
  Administered 2021-09-30: 4 mg via INTRAVENOUS
  Filled 2021-09-30: qty 2

## 2021-09-30 MED ORDER — ONDANSETRON 4 MG PO TBDP: 4.0000 mg | ORAL_TABLET | Freq: Three times a day (TID) | ORAL | 0 refills | Status: DC | PRN

## 2021-09-30 NOTE — ED Triage Notes (Signed)
Pt c/o sharp bilateral flank pain that radiates around to lower abdomen and difficulty urinating since he left the hospital yesterday after having kidney stones removed.

## 2021-09-30 NOTE — ED Provider Notes (Signed)
[facility] EMERGENCY DEPARTMENT Provider Note   CSN: [id number] Arrival date & time: 09/30/21  7824     History Chief Complaint  Patient presents with   Flank Pain    [name] [name] is a 70 y.o. male.  Patient had stents placed in bilateral ureters by Dr. Alyson Ingles from urology yesterday.  Patient had bilateral obstructing stones.  Patient is on oxycodone.  But he says is not helping the pain.  He has bilateral lower quadrant pain and flank pain.  Associated with nausea but no vomiting.      Past Medical History:  Diagnosis Date   Allergic rhinitis, unspecified    Allergy testing via allergist 04/2017--mostly neg.  Dr. Donneta Romberg recommended referral to ENT so i did this.   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   Back pain    LBP->DDD/spondylosis w/ intermittent radiulopathy-type leg pains, + bilat hip and thigh pain-->spinal stenosis L4-5, L5-S1->Dr. Vertell Limber to do decomp and fusion surgery, scheduled for 12/03/20   BPH (benign prostatic hypertrophy)    no hydro on renal u/s 05/2020   Chronic nasal congestion    DDD (degenerative disc disease), lumbar 2010   laminectomy 01/2009.  MR rpt 05/2019->advanced DDD/spondylosis L3-S1, with mod/sev L4-5 spinal stenosis-->plan for surg per Dr. Vertell Limber as of 09/2020   Depression    Hospitalized for suicidal ideation 1992.  Has been on lexapro since 10/2008.   Diabetes mellitus type II    Dx'd approximately 06/2008.  No retinopathy as of 02/2017 ophth.   Erectile dysfunction    Heart murmur, systolic 23/5361   mild intensity, asymptomatic; echo 01/2018 valves fine->obs   History of colon polyps 01/2016   Recall 3 yrs   History of pneumonia 2008   Hospitalized   History of scarlet fever    age 58   Hyperlipidemia, mixed 1993   myalgias on pravastatin   Hypertension 2009   "marked chronic cardiomegaly" on CXR 01/2009 per old records.   Hypogonadism, male 01/11/2012   Axiron trial started 03/2012   Insomnia     Keratoconus    dx'd 1973   Memory changes    Mild cognitive impairment with memory loss 01/2020 eval with Dr. Delice Lesch   MRI brain 03/01/20 NORMAL   OSA on CPAP 2004; 2019   Back on CPAP 04/2018.  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 and surgery for this (?) around 1990    Patient Active Problem List   Diagnosis Date Noted   Cystoid macular edema of left eye 08/26/2021   Nonproliferative diabetic retinopathy of right eye ([place]) 08/26/2021   Nonproliferative diabetic retinopathy of left eye ([place]) 08/26/2021   History of vitrectomy 08/26/2021   Nuclear sclerotic cataract of right eye 08/26/2021   Pseudophakia of left eye 08/26/2021   Keratoconus, stable condition, left 08/26/2021   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   Lumbago with sciatica, right side 09/02/2020   Chronic bilateral low back pain with bilateral sciatica 09/02/2020   Vitamin D deficiency 04/16/2020   Depression 04/16/2020   Class 1 obesity with serious comorbidity and body mass index (BMI) of 32.0 to 32.9 in adult 04/16/2020   Other fatigue 02/14/2018   Shortness of  breath on exertion 02/14/2018   Type 2 diabetes mellitus with hyperglycemia, with long-term current use of insulin ([place]) 02/14/2018   Abnormal EKG 02/14/2018   Maxillary sinusitis, acute 12/17/2015   Cough 12/17/2015   Fever 12/17/2015   Diabetes mellitus without complication ([place]) 35/45/6256   Tooth pain 06/21/2013   Orthostatic lightheadedness 05/17/2013   Health maintenance examination 05/17/2013   Groin strain 11/10/2012   Hypogonadism, male 01/11/2012   Diabetes mellitus ([place]) 07/08/2011   HTN (hypertension), benign 07/08/2011   Hyperlipidemia 07/08/2011   BPH (benign prostatic hypertrophy) 07/08/2011   Hx of adenomatous colonic polyps  07/08/2011    Past Surgical History:  Procedure Laterality Date   APPENDECTOMY  1972   COLONOSCOPY  X 4   Most recent was 2007 [place], [place]), with removal of polyps in the first two.  02/21/16: tubular adenoma.  Recall 3 yrs (Dr. Ardis Hughs).   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 Vertell Limber)   [place]     age 35   TRANSTHORACIC ECHOCARDIOGRAM  02/22/2018   EF 55-60%, grd I DD. Aortic root dilation 41 mm.  05/2021 echo unchanged except aortic root dilation down to 39 mm. Plan rpt 1 yr to monitor aortic root dilation.   URETHRAL DILATION     2 as a child, one as an adult   VASECTOMY  1994   VITRECTOMY Left        Family History  Problem Relation Age of Onset   Heart disease Mother    Hyperlipidemia Mother    Stroke Mother    Diabetes Mother    Depression Mother    Alzheimer's disease Mother    Stroke Father    Hyperlipidemia Father    Heart disease Father    Diabetes Father    Hypertension Father    Obesity Father    Alcohol abuse Sister    Depression Sister     Social History   Tobacco Use   Smoking status: Former    Types: Cigars    Quit date: 09/04/1981    Years since quitting: 40.0   Smokeless tobacco: Never  Vaping Use   Vaping Use: Never used  Substance Use Topics   Alcohol use: Yes    Comment: social, 3-4 drinks month   Drug use: No    Home  Medications Prior to Admission medications   Medication Sig Start Date End Date Taking? Authorizing Provider  HYDROmorphone (DILAUDID) 2 MG tablet Take 1 tablet (2 mg total) by mouth every 6 (six) hours as needed for severe pain. 09/30/21  Yes Fredia Sorrow, MD  ondansetron (ZOFRAN ODT) 4 MG disintegrating tablet Take 1 tablet (4 mg total) by mouth every 8 (eight) hours as needed for nausea or vomiting. 09/30/21  Yes Fredia Sorrow, MD  aspirin 81 MG EC tablet Take 1 tablet (81 mg total) by mouth daily. 11/21/20   McGowen, Adrian Blackwater, MD  atorvastatin (LIPITOR) 80 MG tablet TAKE  1 TABLET(80 MG) BY MOUTH DAILY 08/07/21   McGowen, Adrian Blackwater, MD  azelastine (ASTELIN) 0.1 % nasal spray Place 2 sprays into both nostrils 2 (two) times daily. Use in each nostril as directed Patient taking differently: Place 2 sprays into both nostrils daily as needed for rhinitis. 11/21/20   McGowen, Adrian Blackwater, MD  buPROPion (WELLBUTRIN SR) 150 MG 12 hr tablet Take 1 tablet (150 mg total) by mouth 2 (two) times daily. 09/15/21   Danford, Valetta Fuller D, NP  Choline Fenofibrate (FENOFIBRIC ACID) 135 MG CPDR TAKE 1 CAPSULE BY MOUTH DAILY 08/07/21   McGowen, Adrian Blackwater, MD  escitalopram (LEXAPRO) 20 MG tablet TAKE 1 TABLET(20 MG) BY MOUTH DAILY Patient taking differently: Take 20 mg by mouth at bedtime. 09/15/21   Danford, Valetta Fuller D, NP  finasteride (PROSCAR) 5 MG tablet TAKE 1 TABLET(5 MG) BY MOUTH DAILY Patient taking differently: Take 5 mg by mouth at bedtime. 08/07/21   McGowen, Adrian Blackwater, MD  fluticasone (FLONASE) 50 MCG/ACT nasal spray Place 2 sprays into both nostrils daily. Patient taking differently: Place 2 sprays into both nostrils daily as needed for allergies. 02/13/19   McGowen, Adrian Blackwater, MD  gabapentin (NEURONTIN) 300 MG capsule Take 900 mg by mouth at bedtime. 05/17/19   Erline Levine, MD  glucose blood (ONE TOUCH ULTRA TEST) test strip 1 each by Other route 3 (three) times daily. Test sugars up to three times daily. ( insurance  preference  DX E11.65 E11.9) 04/05/20   McGowen, Adrian Blackwater, MD  glucose blood test strip Per Insurance Preference- Test sugars up to three times daily DX E11.9 03/28/20   McGowen, Adrian Blackwater, MD  HYDROcodone-acetaminophen (NORCO/VICODIN) 5-325 MG tablet Take 1 tablet by mouth every 6 (six) hours as needed for moderate pain or severe pain. When not taking the oxy 01/04/21   [provider]  insulin detemir (LEVEMIR FLEXTOUCH) 100 UNIT/ML FlexPen Inject 50 Units into the skin every evening. 06/24/21   McGowen, Adrian Blackwater, MD  Insulin Pen Needle (BD PEN NEEDLE NANO 2ND GEN) 32G X 4 MM MISC USE TO INJECT 4 TIMES DAILY 08/11/21   Danford, Berna Spare, NP  Krill Oil 500 MG CAPS Take 500 mg by mouth at bedtime.    [provider]  metFORMIN (GLUCOPHAGE) 1000 MG tablet TAKE 1 TABLET BY MOUTH TWICE DAILY WITH A MEAL 08/07/21   McGowen, Adrian Blackwater, MD  methocarbamol (ROBAXIN) 500 MG tablet Take 500 mg by mouth daily as needed for muscle spasms. 05/14/21   [provider]  nitrofurantoin, macrocrystal-monohydrate, (MACROBID) 100 MG capsule Take 1 capsule (100 mg total) by mouth 2 (two) times daily. Patient taking differently: Take 100 mg by mouth 2 (two) times daily. 7& 3 09/25/21   Marcello Fennel, PA-C  ondansetron (ZOFRAN) 4 MG tablet Take 1 tablet (4 mg total) by mouth every 8 (eight) hours as needed for nausea or vomiting. 09/25/21   Marcello Fennel, PA-C  oxyCODONE-acetaminophen (PERCOCET) 5-325 MG tablet Take 1 tablet by mouth every 4 (four) hours as needed for severe pain or moderate pain. 09/29/21 09/29/22  McKenzie, Candee Furbish, MD  Semaglutide, 2 MG/DOSE, (OZEMPIC, 2 MG/DOSE,) 8 MG/3ML SOPN Inject 2 mg into the skin once a week. Patient taking differently: Inject 2 mg into the skin every Sunday. 09/15/21   Danford, Valetta Fuller D, NP  sodium chloride (OCEAN) 0.65 % nasal spray Place 1 spray into the nose daily as needed for congestion.    [provider]  tadalafil (CIALIS)  5 MG tablet  Take 5 mg by mouth every morning. 09/19/21   [provider]  tamsulosin (FLOMAX) 0.4 MG CAPS capsule Take 1 capsule (0.4 mg total) by mouth once for 1 dose. Patient taking differently: Take 0.4 mg by mouth at bedtime. 09/25/21 09/26/21  Marcello Fennel, PA-C  vitamin B-12 (CYANOCOBALAMIN) 1000 MCG tablet Take 2,000 mcg by mouth at bedtime.    [provider]    Allergies    Simvastatin; Antihistamines, diphenhydramine-type; Codeine; Penicillins; and Sulfa antibiotics  Review of Systems   Review of Systems  Constitutional:  Negative for chills and fever.  HENT:  Negative for rhinorrhea and sore throat.   Eyes:  Negative for visual disturbance.  Respiratory:  Negative for cough and shortness of breath.   Cardiovascular:  Negative for chest pain and leg swelling.  Gastrointestinal:  Positive for abdominal pain and nausea. Negative for diarrhea and vomiting.  Genitourinary:  Positive for flank pain. Negative for dysuria.  Musculoskeletal:  Negative for back pain and neck pain.  Skin:  Negative for rash.  Neurological:  Negative for dizziness, light-headedness and headaches.  Hematological:  Does not bruise/bleed easily.  Psychiatric/Behavioral:  Negative for confusion.    Physical Exam Updated Vital Signs BP 130/80   Pulse 67   Temp 98.9 F (37.2 C) (Oral)   Resp 18   Ht 1.778 m (5\' 10" )   Wt 103 kg   SpO2 91%   BMI 32.57 kg/m   Physical Exam Vitals and nursing note reviewed.  Constitutional:      General: He is not in acute distress.    Appearance: Normal appearance. He is well-developed.  HENT:     Head: Normocephalic and atraumatic.  Eyes:     Conjunctiva/sclera: Conjunctivae normal.  Cardiovascular:     Rate and Rhythm: Normal rate and regular rhythm.     Heart sounds: No murmur heard. Pulmonary:     Effort: Pulmonary effort is normal. No respiratory distress.     Breath sounds: Normal breath sounds.  Abdominal:     Palpations: Abdomen is  soft.     Tenderness: There is no abdominal tenderness. There is no guarding.  Musculoskeletal:        General: No swelling. Normal range of motion.     Cervical back: Neck supple.  Skin:    General: Skin is warm and dry.  Neurological:     General: No focal deficit present.     Mental Status: He is alert and oriented to person, place, and time.    ED Results / Procedures / Treatments   Labs (all labs ordered are listed, but only abnormal results are displayed) Labs Reviewed  CBC - Abnormal; Notable for the following components:      Result Value   Hemoglobin 12.8 (*)    HCT 37.8 (*)    All other components within normal limits  URINALYSIS, ROUTINE W REFLEX MICROSCOPIC - Abnormal; Notable for the following components:   Specific Gravity, Urine 1.004 (*)    Hgb urine dipstick LARGE (*)    Leukocytes,Ua TRACE (*)    RBC / HPF >50 (*)    All other components within normal limits  COMPREHENSIVE METABOLIC PANEL - Abnormal; Notable for the following components:   Glucose, Bld 140 (*)    Creatinine, Ser 1.45 (*)    Alkaline Phosphatase 31 (*)    GFR, Estimated 52 (*)    All other components within normal limits  URINE CULTURE  EKG None  Radiology DG C-Arm 1-60 Min-No Report  Result Date: 09/29/2021 Fluoroscopy was utilized by the requesting physician.  No radiographic interpretation.   CT Renal Stone Study  Result Date: 09/30/2021 CLINICAL DATA:  Bilateral flank pain, recent lithotripsy EXAM: CT ABDOMEN AND PELVIS WITHOUT CONTRAST TECHNIQUE: Multidetector CT imaging of the abdomen and pelvis was performed following the standard protocol without IV contrast. COMPARISON:  09/25/2021 FINDINGS: Lower chest: Small linear densities in the lower lung fields may suggest scarring or minimal subsegmental atelectasis. 4 mm nodule seen in the right lower lobe in the previous examination appears to be obscured by small linear subsegmental atelectasis. There are scattered coronary artery  calcifications. Hepatobiliary: No focal abnormality is seen in liver. Liver measures 18.2 cm in length. There are multiple calcified gallbladder stones. There is no dilation of bile ducts. Pancreas: No focal abnormality is seen. Spleen: Unremarkable. Adrenals/Urinary Tract: Adrenals are not enlarged. There is no hydronephrosis. There is interval passage or removal of right ureteral calculus. There is interval placement of left ureteral stent. Calculus seen in the distal course of left ureter in the previous study is not seen in the current study. In image 59, small pocket of air is seen in the distal left ureter which may be related to instrumentation. There is mild stranding adjacent to the renal pelvis and ureters on both sides, possibly due to recent ureteric obstruction and instrumentation. Possibility pyelonephritis is not excluded. There are no demonstrable renal stones. Urinary bladder is not distended. Small pockets of air in the urinary bladder most likely related to recent instrumentation. Stomach/Bowel: Stomach is unremarkable. Small bowel loops are not dilated. Appendix is not distinctly seen. There is no focal pericecal inflammation. There is no significant wall thickening in colon. Few diverticula are seen. Vascular/Lymphatic: There are scattered arterial calcifications. No significant lymphadenopathy is seen. Reproductive: Prostate appears smaller than usual in size. Other: There is no ascites or pneumoperitoneum. Musculoskeletal: There is previous surgical fusion from L4-S1 levels. IMPRESSION: There is interval passage or removal of bilateral ureteral stones. There is interval decrease in bilateral hydronephrosis. Left ureteral stent is noted. There is stranding in the fat planes adjacent to renal pelves and ureters on both sides which may be related to recent passage of ureteral calculi and instrumentation. Possibility of pyelonephritis is not excluded. There is no evidence of intestinal obstruction  or pneumoperitoneum. Gallbladder stones. There is no dilation of bile ducts. Coronary artery calcifications are seen. Diverticulosis of colon. Other findings as described in the body of the report. Electronically Signed   By: Elmer Picker M.D.   On: 09/30/2021 12:11    Procedures Procedures   Medications Ordered in ED Medications  ondansetron (ZOFRAN) injection 4 mg (4 mg Intravenous Given 09/30/21 1135)  HYDROmorphone (DILAUDID) injection 1 mg (1 mg Intravenous Given 09/30/21 1136)    ED Course  I have reviewed the triage vital signs and the nursing notes.  Pertinent labs & imaging results that were available during my care of the patient were reviewed by me and considered in my medical decision making (see chart for details).    MDM Rules/Calculators/A&P                          CT scan shows good flow.  No complicating factors.  Urinalysis probably secondary to the stent.  But sent for culture.  Renal function without significant improvement but CT scan shows improvement of flow and no evidence of any  significant hydronephrosis.  Patient's pain improved here significantly with 1 mg of hydromorphone.  Will discharge home with supplemental oral hydromorphone to take.  And also let patient know that of his Percocet pills he can take 2 every 6 hours if needed.  Patient is due to follow back up with urology in a week.   Final Clinical Impression(s) / ED Diagnoses Final diagnoses:  Flank pain    Rx / DC Orders ED Discharge Orders          Ordered    HYDROmorphone (DILAUDID) 2 MG tablet  Every 6 hours PRN        09/30/21 1345    ondansetron (ZOFRAN ODT) 4 MG disintegrating tablet  Every 8 hours PRN        09/30/21 1345             Fredia Sorrow, MD 09/30/21 1347

## 2021-09-30 NOTE — Telephone Encounter (Signed)
Patient called stating he has been in severe pain since coming home from hospital. Patient complained of bilateral back pain, abdominal pain, nausea, and decreased appetite. Patient states he is unable to walk without severe pain and has been unable to sleep. Patient states he has had only one good urine flow and has had only drips since. He states he feels bloated and has been unable to have a bowel movement. Patient states he feels warmer than usual but has not been able to get an accurate temperature. Pain medication prescribed is not touching his pain. Patient informed to go to ER for evaluation. Patient voiced understanding.

## 2021-09-30 NOTE — ED Notes (Signed)
Patient transported to CT 

## 2021-09-30 NOTE — Discharge Instructions (Addendum)
Keep your appointment with Dr. Alyson Ingles for urology scheduled for next week.  We will add on hydromorphone orally to your pain regimen.  If you need to on the Percocet you can take 2 every 6 hours or 1 every 4.  You can take the hydromorphone orally with the Percocet.  Hopefully this will help control your pain.  CT scan here today showed no complications.  Return for any fevers or chills.  Or any new or worse symptoms.

## 2021-10-02 LAB — URINE CULTURE: Culture: NO GROWTH

## 2021-10-02 NOTE — Telephone Encounter (Signed)
Patient called office this morning stating he is still having low urine output and some discomfort.  Per Dr. Alyson Ingles patient informed to pull stent. Patient informed to call office back if symptoms worsen. Patient voiced understanding.

## 2021-10-03 LAB — CALCULI, WITH PHOTOGRAPH (CLINICAL LAB)
Calcium Oxalate Dihydrate: 50 %
Calcium Oxalate Monohydrate: 30 %
Hydroxyapatite: 20 %
Weight Calculi: 11 mg

## 2021-10-06 ENCOUNTER — Encounter: Payer: Self-pay | Admitting: Family Medicine

## 2021-10-08 ENCOUNTER — Other Ambulatory Visit: Payer: Self-pay

## 2021-10-08 ENCOUNTER — Ambulatory Visit (INDEPENDENT_AMBULATORY_CARE_PROVIDER_SITE_OTHER): Payer: Medicare Other | Admitting: Urology

## 2021-10-08 ENCOUNTER — Encounter: Payer: Self-pay | Admitting: Urology

## 2021-10-08 VITALS — BP 129/67 | HR 85

## 2021-10-08 DIAGNOSIS — N201 Calculus of ureter: Secondary | ICD-10-CM | POA: Diagnosis not present

## 2021-10-08 LAB — URINALYSIS, ROUTINE W REFLEX MICROSCOPIC
Bilirubin, UA: NEGATIVE
Glucose, UA: NEGATIVE
Ketones, UA: NEGATIVE
Leukocytes,UA: NEGATIVE
Nitrite, UA: NEGATIVE
Protein,UA: NEGATIVE
Specific Gravity, UA: 1.02 (ref 1.005–1.030)
Urobilinogen, Ur: 0.2 mg/dL (ref 0.2–1.0)
pH, UA: 7 (ref 5.0–7.5)

## 2021-10-08 LAB — MICROSCOPIC EXAMINATION
Renal Epithel, UA: NONE SEEN /hpf
WBC, UA: NONE SEEN /hpf (ref 0–5)

## 2021-10-08 NOTE — Patient Instructions (Signed)
Dietary Guidelines to Help Prevent Kidney Stones Kidney stones are deposits of minerals and salts that form inside your kidneys. Your risk of developing kidney stones may be greater depending on your diet, your lifestyle, the medicines you take, and whether you have certain medical conditions. Most people can lower their chances of developing kidney stones by following the instructions below. Your dietitian may give you more specific instructions depending on your overall health and the type of kidney stones you tend to develop. What are tips for following this plan? Reading food labels  Choose foods with "no salt added" or "low-salt" labels. Limit your salt (sodium) intake to less than 1,500 mg a day. Choose foods with calcium for each meal and snack. Try to eat about 300 mg of calcium at each meal. Foods that contain 200-500 mg of calcium a serving include: 8 oz (237 mL) of milk, calcium-fortifiednon-dairy milk, and calcium-fortifiedfruit juice. Calcium-fortified means that calcium has been added to these drinks. 8 oz (237 mL) of kefir, yogurt, and soy yogurt. 4 oz (114 g) of tofu. 1 oz (28 g) of cheese. 1 cup (150 g) of dried figs. 1 cup (91 g) of cooked broccoli. One 3 oz (85 g) can of sardines or mackerel. Most people need 1,000-1,500 mg of calcium a day. Talk to your dietitian about how much calcium is recommended for you. Shopping Buy plenty of fresh fruits and vegetables. Most people do not need to avoid fruits and vegetables, even if these foods contain nutrients that may contribute to kidney stones. When shopping for convenience foods, choose: Whole pieces of fruit. Pre-made salads with dressing on the side. Low-fat fruit and yogurt smoothies. Avoid buying frozen meals or prepared deli foods. These can be high in sodium. Look for foods with live cultures, such as yogurt and kefir. Choose high-fiber grains, such as whole-wheat breads, oat bran, and wheat cereals. Cooking Do not add  salt to food when cooking. Place a salt shaker on the table and allow each person to add his or her own salt to taste. Use vegetable protein, such as beans, textured vegetable protein (TVP), or tofu, instead of meat in pasta, casseroles, and soups. Meal planning Eat less salt, if told by your dietitian. To do this: Avoid eating processed or pre-made food. Avoid eating fast food. Eat less animal protein, including cheese, meat, poultry, or fish, if told by your dietitian. To do this: Limit the number of times you have meat, poultry, fish, or cheese each week. Eat a diet free of meat at least 2 days a week. Eat only one serving each day of meat, poultry, fish, or seafood. When you prepare animal protein, cut pieces into small portion sizes. For most meat and fish, one serving is about the size of the palm of your hand. Eat at least five servings of fresh fruits and vegetables each day. To do this: Keep fruits and vegetables on hand for snacks. Eat one piece of fruit or a handful of berries with breakfast. Have a salad and fruit at lunch. Have two kinds of vegetables at dinner. Limit foods that are high in a substance called oxalate. These include: Spinach (cooked), rhubarb, beets, sweet potatoes, and Swiss chard. Peanuts. Potato chips, french fries, and baked potatoes with skin on. Nuts and nut products. Chocolate. If you regularly take a diuretic medicine, make sure to eat at least 1 or 2 servings of fruits or vegetables that are high in potassium each day. These include: Avocado. Banana. Orange, prune,   carrot, or tomato juice. Baked potato. Cabbage. Beans and split peas. Lifestyle  Drink enough fluid to keep your urine pale yellow. This is the most important thing you can do. Spread your fluid intake throughout the day. If you drink alcohol: Limit how much you use to: 0-1 drink a day for women who are not pregnant. 0-2 drinks a day for men. Be aware of how much alcohol is in your  drink. In the [place] one drink equals one 12 oz bottle of beer (355 mL), one 5 oz glass of wine (148 mL), or one 1 oz glass of hard liquor (44 mL). Lose weight if told by your health care provider. Work with your dietitian to find an eating plan and weight loss strategies that work best for you. General information Talk to your health care provider and dietitian about taking daily supplements. You may be told the following depending on your health and the cause of your kidney stones: Not to take supplements with vitamin C. To take a calcium supplement. To take a daily probiotic supplement. To take other supplements such as magnesium, fish oil, or vitamin B6. Take over-the-counter and prescription medicines only as told by your health care provider. These include supplements. What foods should I limit? Limit your intake of the following foods, or eat them as told by your dietitian. Vegetables Spinach. Rhubarb. Beets. Canned vegetables. Pickles. Olives. Baked potatoes with skin. Grains Wheat bran. Baked goods. Salted crackers. Cereals high in sugar. Meats and other proteins Nuts. Nut butters. Large portions of meat, poultry, or fish. Salted, precooked, or cured meats, such as sausages, meat loaves, and hot dogs. Dairy Cheese. Beverages Regular soft drinks. Regular vegetable juice. Seasonings and condiments Seasoning blends with salt. Salad dressings. Soy sauce. Ketchup. Barbecue sauce. Other foods Canned soups. Canned pasta sauce. Casseroles. Pizza. Lasagna. Frozen meals. Potato chips. French fries. The items listed above may not be a complete list of foods and beverages you should limit. Contact a dietitian for more information. What foods should I avoid? Talk to your dietitian about specific foods you should avoid based on the type of kidney stones you have and your overall health. Fruits Grapefruit. The item listed above may not be a complete list of foods and beverages you should  avoid. Contact a dietitian for more information. Summary Kidney stones are deposits of minerals and salts that form inside your kidneys. You can lower your risk of kidney stones by making changes to your diet. The most important thing you can do is drink enough fluid. Drink enough fluid to keep your urine pale yellow. Talk to your dietitian about how much calcium you should have each day, and eat less salt and animal protein as told by your dietitian. This information is not intended to replace advice given to you by your health care provider. Make sure you discuss any questions you have with your health care provider. Document Revised: 11/09/2019 Document Reviewed: 11/09/2019 Elsevier Patient Education  2022 Elsevier Inc.  

## 2021-10-08 NOTE — Progress Notes (Signed)
10/08/2021 9:25 AM   [name] December 12, 1950 [id number]  Referring provider: Tammi Sou, MD [address] [place],  [place] [address]  Followup nephrolithiaiss   HPI: Mr Lacko is a 70yo here for followup for nephrolithiasis. He underwent bilateral ureteroscopic stone extraction. He removed his stent POD#3. No flank pain. Stones were CaOx. No significant LUTS. No other complaints today   PMH: Past Medical History:  Diagnosis Date   Allergic rhinitis, unspecified    Allergy testing via allergist 04/2017--mostly neg.  Dr. Donneta Romberg recommended referral to ENT so i did this.   Aortic root dilatation ([place]) 2019   2019, 41 mm, 2022, 39 mm-->rpt 1 yr.   Back pain    LBP->DDD/spondylosis w/ intermittent radiulopathy-type leg pains, + bilat hip and thigh pain-->spinal stenosis L4-5, L5-S1->Dr. Vertell Limber to do decomp and fusion surgery, scheduled for 12/03/20   BPH (benign prostatic hypertrophy)    no hydro on renal u/s 05/2020   Chronic nasal congestion    DDD (degenerative disc disease), lumbar 2010   laminectomy 01/2009.  MR rpt 05/2019->advanced DDD/spondylosis L3-S1, with mod/sev L4-5 spinal stenosis-->plan for surg per Dr. Vertell Limber as of 09/2020   Depression    Hospitalized for suicidal ideation 1992.  Has been on lexapro since 10/2008.   Diabetes mellitus type II    Dx'd approximately 06/2008.  No retinopathy as of 02/2017 ophth.   Erectile dysfunction    Heart murmur, systolic 74/2595   mild intensity, asymptomatic; echo 01/2018 valves fine->obs   History of colon polyps 01/2016   Recall 3 yrs   History of pneumonia 2008   Hospitalized   History of scarlet fever    age 52   Hyperlipidemia, mixed 1993   myalgias on pravastatin   Hypertension 2009   "marked chronic cardiomegaly" on CXR 01/2009 per old records.   Hypogonadism, male 01/11/2012   Axiron trial started 03/2012   Insomnia    Keratoconus    dx'd 1973   Memory changes    Mild cognitive impairment with  memory loss 01/2020 eval with Dr. Delice Lesch   MRI brain 03/01/20 NORMAL   Nephrolithiasis    calcium oxalate   OSA on CPAP 2004; 2019   Back on CPAP 04/2018.  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  02/21/16: tubular adenoma.  Recall 3 yrs (Dr. Ardis Hughs).   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 Vertell Limber)  TONSILLECTOMY AND ADENOIDECTOMY     age 47   TRANSTHORACIC ECHOCARDIOGRAM  02/22/2018   EF 55-60%, grd I DD. Aortic root dilation 41 mm.  05/2021 echo unchanged except aortic root dilation down to 39 mm. Plan rpt 1 yr to monitor aortic root dilation.   URETHRAL DILATION     2 as a child, one as an adult   VASECTOMY  1994   VITRECTOMY Left     Home Medications:  Allergies as of 10/08/2021       Reactions   Simvastatin Other (See Comments)   myalgias   Antihistamines, Diphenhydramine-type Other (See Comments)   depression   Codeine Nausea Only   Penicillins Hives   Sulfa Antibiotics  Hives        Medication List        Accurate as of October 08, 2021  9:25 AM. If you have any questions, ask your nurse or doctor.          aspirin 81 MG EC tablet Take 1 tablet (81 mg total) by mouth daily.   atorvastatin 80 MG tablet Commonly known as: LIPITOR TAKE 1 TABLET(80 MG) BY MOUTH DAILY   azelastine 0.1 % nasal spray Commonly known as: ASTELIN Place 2 sprays into both nostrils 2 (two) times daily. Use in each nostril as directed What changed:  when to take this reasons to take this additional instructions   BD Pen Needle Nano 2nd Gen 32G X 4 MM Misc Generic drug: Insulin Pen Needle USE TO INJECT 4 TIMES DAILY   buPROPion 150 MG 12 hr tablet Commonly known as: WELLBUTRIN SR Take 1 tablet (150 mg total) by mouth 2 (two) times daily.   escitalopram 20 MG tablet Commonly known as: LEXAPRO TAKE 1 TABLET(20 MG) BY MOUTH DAILY What changed:  how much to take how to take this when to take this additional instructions   Fenofibric Acid 135 MG Cpdr TAKE 1 CAPSULE BY MOUTH DAILY   finasteride 5 MG tablet Commonly known as: PROSCAR TAKE 1 TABLET(5 MG) BY MOUTH DAILY What changed: See the new instructions.   fluticasone 50 MCG/ACT nasal spray Commonly known as: FLONASE Place 2 sprays into both nostrils daily. What changed:  when to take this reasons to take this   gabapentin 300 MG capsule Commonly known as: NEURONTIN Take 900 mg by mouth at bedtime.   glucose blood test strip Per Insurance Preference- Test sugars up to three times daily DX E11.9   glucose blood test strip Commonly known as: ONE TOUCH ULTRA TEST 1 each by Other route 3 (three) times daily. Test sugars up to three times daily. ( insurance preference  DX E11.65 E11.9)   HYDROcodone-acetaminophen 5-325 MG tablet Commonly known as: NORCO/VICODIN Take 1 tablet by mouth every 6 (six) hours as needed for moderate pain or severe pain. When not taking the oxy   HYDROmorphone 2 MG  tablet Commonly known as: Dilaudid Take 1 tablet (2 mg total) by mouth every 6 (six) hours as needed for severe pain.   Krill Oil 500 MG Caps Take 500 mg by mouth at bedtime.   Levemir FlexTouch 100 UNIT/ML FlexPen Generic drug: insulin detemir Inject 50 Units into the skin every evening.   metFORMIN 1000 MG tablet Commonly known as: GLUCOPHAGE TAKE 1 TABLET BY MOUTH TWICE DAILY WITH A MEAL   methocarbamol 500 MG tablet Commonly known as: ROBAXIN Take 500 mg by mouth daily as needed for muscle spasms.   nitrofurantoin (macrocrystal-monohydrate) 100 MG capsule  Commonly known as: MACROBID Take 1 capsule (100 mg total) by mouth 2 (two) times daily.   ondansetron 4 MG disintegrating tablet Commonly known as: Zofran ODT Take 1 tablet (4 mg total) by mouth every 8 (eight) hours as needed for nausea or vomiting.   ondansetron 4 MG tablet Commonly known as: ZOFRAN Take 1 tablet (4 mg total) by mouth every 8 (eight) hours as needed for nausea or vomiting.   oxyCODONE-acetaminophen 5-325 MG tablet Commonly known as: Percocet Take 1 tablet by mouth every 4 (four) hours as needed for severe pain or moderate pain.   Ozempic (2 MG/DOSE) 8 MG/3ML Sopn Generic drug: Semaglutide (2 MG/DOSE) Inject 2 mg into the skin once a week. What changed: when to take this   sodium chloride 0.65 % nasal spray Commonly known as: OCEAN Place 1 spray into the nose daily as needed for congestion.   tadalafil 5 MG tablet Commonly known as: CIALIS Take 5 mg by mouth every morning.   tamsulosin 0.4 MG Caps capsule Commonly known as: FLOMAX Take 1 capsule (0.4 mg total) by mouth once for 1 dose. What changed: when to take this   vitamin B-12 1000 MCG tablet Commonly known as: CYANOCOBALAMIN Take 2,000 mcg by mouth at bedtime.        Allergies:  Allergies  Allergen Reactions   Simvastatin Other (See Comments)    myalgias   Antihistamines, Diphenhydramine-Type Other (See Comments)     depression   Codeine Nausea Only   Penicillins Hives   Sulfa Antibiotics Hives    Family History: Family History  Problem Relation Age of Onset   Heart disease Mother    Hyperlipidemia Mother    Stroke Mother    Diabetes Mother    Depression Mother    Alzheimer's disease Mother    Stroke Father    Hyperlipidemia Father    Heart disease Father    Diabetes Father    Hypertension Father    Obesity Father    Alcohol abuse Sister    Depression Sister     Social History:  reports that he quit smoking about 40 years ago. His smoking use included cigars. He has never used smokeless tobacco. He reports current alcohol use. He reports that he does not use drugs.  ROS: All other review of systems were reviewed and are negative except what is noted above in HPI  Physical Exam: BP 129/67   Pulse 85   Constitutional:  Alert and oriented, No acute distress. HEENT: [place] AT, moist mucus membranes.  Trachea midline, no masses. Cardiovascular: No clubbing, cyanosis, or edema. Respiratory: Normal respiratory effort, no increased work of breathing. GI: Abdomen is soft, nontender, nondistended, no abdominal masses GU: No CVA tenderness.  Lymph: No cervical or inguinal lymphadenopathy. Skin: No rashes, bruises or suspicious lesions. Neurologic: Grossly intact, no focal deficits, moving all 4 extremities. Psychiatric: Normal mood and affect.  Laboratory Data: Lab Results  Component Value Date   WBC 9.3 09/30/2021   HGB 12.8 (L) 09/30/2021   HCT 37.8 (L) 09/30/2021   MCV 87.1 09/30/2021   PLT 303 09/30/2021    Lab Results  Component Value Date   CREATININE 1.45 (H) 09/30/2021    Lab Results  Component Value Date   PSA 0.02 (L) 05/22/2021   PSA 0.08 (L) 05/16/2020   PSA 0.04 (L) 05/19/2019    Lab Results  Component Value Date   TESTOSTERONE 87.17 (L) 04/13/2012    Lab Results  Component Value Date  HGBA1C 6.0 (H) 09/15/2021    Urinalysis    Component Value Date/Time    COLORURINE YELLOW 09/30/2021 1102   APPEARANCEUR CLEAR 09/30/2021 1102   APPEARANCEUR Clear 09/26/2021 1238   LABSPEC 1.004 (L) 09/30/2021 1102   PHURINE 6.0 09/30/2021 1102   GLUCOSEU NEGATIVE 09/30/2021 1102   HGBUR LARGE (A) 09/30/2021 1102   BILIRUBINUR NEGATIVE 09/30/2021 1102   BILIRUBINUR Negative 09/26/2021 [place] 09/30/2021 1102   PROTEINUR NEGATIVE 09/30/2021 1102   NITRITE NEGATIVE 09/30/2021 1102   LEUKOCYTESUR TRACE (A) 09/30/2021 1102    Lab Results  Component Value Date   LABMICR See below: 09/26/2021   WBCUA None seen 09/26/2021   LABEPIT None seen 09/26/2021   MUCUS Present 09/26/2021   BACTERIA NONE SEEN 09/30/2021    Pertinent Imaging:  No results found for this or any previous visit.  No results found for this or any previous visit.  No results found for this or any previous visit.  No results found for this or any previous visit.  Results for orders placed in visit on 05/16/20  US RENAL  Narrative CLINICAL DATA:  70 year old male with BPH and obstructive symptoms  EXAM: RENAL / URINARY TRACT ULTRASOUND COMPLETE  COMPARISON:  None.  FINDINGS: Right Kidney:  Length: 12.3 cm x 6.3 cm x 6.2 cm, 251 cc. No hydronephrosis. Echogenicity unremarkable. Flow in the hilum of the right kidney confirmed. Anechoic cystic structure with through transmission measures 1.5 cm x 1.0 cm x 1.9 cm.  Left Kidney:  Length: 12.3 cm x 6.9 cm x 6.7 cm, 296 cc. Echogenicity within normal limits. No hydronephrosis. Flow confirmed in the left kidney. There is a small hyperechoic focus on the lateral cortex of the left kidney measuring 5 mm. No internal flow.  Bladder:  Urinary bladder incompletely distended.  Ureteral jets visualized.  IMPRESSION: No evidence of hydronephrosis.  Bosniak 1 cyst of the right kidney.  Hyperechoic focus on the lateral cortex of the left kidney measures 5 mm, potentially a small AML or pararenal fat  within a lobulation.   Electronically Signed By: Corrie Mckusick D.O. On: 06/06/2020 13:31  No results found for this or any previous visit.  No results found for this or any previous visit.  Results for orders placed during the hospital encounter of 09/30/21  CT Renal Stone Study  Narrative CLINICAL DATA:  Bilateral flank pain, recent lithotripsy  EXAM: CT ABDOMEN AND PELVIS WITHOUT CONTRAST  TECHNIQUE: Multidetector CT imaging of the abdomen and pelvis was performed following the standard protocol without IV contrast.  COMPARISON:  09/25/2021  FINDINGS: Lower chest: Small linear densities in the lower lung fields may suggest scarring or minimal subsegmental atelectasis. 4 mm nodule seen in the right lower lobe in the previous examination appears to be obscured by small linear subsegmental atelectasis. There are scattered coronary artery calcifications.  Hepatobiliary: No focal abnormality is seen in liver. Liver measures 18.2 cm in length. There are multiple calcified gallbladder stones. There is no dilation of bile ducts.  Pancreas: No focal abnormality is seen.  Spleen: Unremarkable.  Adrenals/Urinary Tract: Adrenals are not enlarged. There is no hydronephrosis. There is interval passage or removal of right ureteral calculus. There is interval placement of left ureteral stent. Calculus seen in the distal course of left ureter in the previous study is not seen in the current study. In image 59, small pocket of air is seen in the distal left ureter which may be related to instrumentation. There  is mild stranding adjacent to the renal pelvis and ureters on both sides, possibly due to recent ureteric obstruction and instrumentation. Possibility pyelonephritis is not excluded. There are no demonstrable renal stones. Urinary bladder is not distended. Small pockets of air in the urinary bladder most likely related to recent instrumentation.  Stomach/Bowel: Stomach is  unremarkable. Small bowel loops are not dilated. Appendix is not distinctly seen. There is no focal pericecal inflammation. There is no significant wall thickening in colon. Few diverticula are seen.  Vascular/Lymphatic: There are scattered arterial calcifications. No significant lymphadenopathy is seen.  Reproductive: Prostate appears smaller than usual in size.  Other: There is no ascites or pneumoperitoneum.  Musculoskeletal: There is previous surgical fusion from L4-S1 levels.  IMPRESSION: There is interval passage or removal of bilateral ureteral stones. There is interval decrease in bilateral hydronephrosis. Left ureteral stent is noted. There is stranding in the fat planes adjacent to renal pelves and ureters on both sides which may be related to recent passage of ureteral calculi and instrumentation. Possibility of pyelonephritis is not excluded.  There is no evidence of intestinal obstruction or pneumoperitoneum. Gallbladder stones. There is no dilation of bile ducts. Coronary artery calcifications are seen. Diverticulosis of colon.  Other findings as described in the body of the report.   Electronically Signed By: Elmer Picker M.D. On: 09/30/2021 12:11   Assessment & Plan:    1. Ureteral stone -RTC 6 weeks with renal US - Urinalysis, Routine w reflex microscopic   No follow-ups on file.  Nicolette Bang, MD  [facility] [place]

## 2021-10-14 ENCOUNTER — Ambulatory Visit (INDEPENDENT_AMBULATORY_CARE_PROVIDER_SITE_OTHER): Payer: Medicare Other | Admitting: Adult Health

## 2021-10-14 ENCOUNTER — Encounter (INDEPENDENT_AMBULATORY_CARE_PROVIDER_SITE_OTHER): Payer: Self-pay | Admitting: Adult Health

## 2021-10-14 ENCOUNTER — Other Ambulatory Visit: Payer: Self-pay

## 2021-10-14 VITALS — BP 126/76 | HR 79 | Temp 98.0°F | Ht 70.0 in | Wt 224.0 lb

## 2021-10-14 DIAGNOSIS — E1169 Type 2 diabetes mellitus with other specified complication: Secondary | ICD-10-CM

## 2021-10-14 DIAGNOSIS — N200 Calculus of kidney: Secondary | ICD-10-CM | POA: Diagnosis not present

## 2021-10-14 DIAGNOSIS — E669 Obesity, unspecified: Secondary | ICD-10-CM | POA: Diagnosis not present

## 2021-10-14 DIAGNOSIS — Z6832 Body mass index (BMI) 32.0-32.9, adult: Secondary | ICD-10-CM | POA: Diagnosis not present

## 2021-10-14 DIAGNOSIS — Z794 Long term (current) use of insulin: Secondary | ICD-10-CM | POA: Diagnosis not present

## 2021-10-14 NOTE — Progress Notes (Signed)
Chief Complaint:   OBESITY [name] [name] is here to discuss his progress with his obesity treatment plan along with follow-up of his obesity related diagnoses. [name] [name] is on the Category 4 Plan and states he is following his eating plan approximately 85% of the time. [name] [name] states he is not exercising regularly.  Today's visit was #: 10 Starting weight: 273 lbs Starting date: 02/14/2018 Today's weight: 224 lbs Today's date: 10/14/2021 Total lbs lost to date: 49 lbs Total lbs lost since last in-office visit: 3 lbs  Interim History: On 09/25/2021 - ED visit:  Calculus of lower urinary tract. On 09/26/2021, Urology visit:  Nephrolithiasis. On 09/29/2021 - Cystoscopy with retrograde pyelogram/stent. On 09/30/2021 - Flank pain. On 10/08/2021 - Ureteral stone - US renal/CT renal study - reviewed with patient. Reviewed dietary guidelines per Nephrology, at length with patient.  Subjective:   1. Type 2 diabetes mellitus with other specified complication, with long-term current use of insulin (HCC) He is on Metformin 1000mg  BID, Ozempic 2mg  once week, and Levemir 50 U every evening. On 09/15/2021, A1c 6.0 - at goal.  2. Nephrolithiasis He has experienced acute sx's the last 2 weeks- been seen in ED and by his established Urologist. He denies acute sx's at present. He was given dietary guidelines and encouraged to drink at 72 oz water/day by her Urologist.  Assessment/Plan:   1. Type 2 diabetes mellitus with other specified complication, with long-term current use of insulin (HCC) Continue current antidiabetic prescriptions per PCP and our clinic.  2. Nephrolithiasis Follow dietary guidelines per Nephrology.  Follow-up with Nephrology in December 2023.  3. Obesity, current BMI 32.2  [name] [name] is currently in the action stage of change. As such, his goal is to continue with weight loss efforts. He has agreed to dietary guidelines per Nephrology.   Exercise goals: No exercise has been  prescribed at this time.  Behavioral modification strategies: increasing lean protein intake, decreasing simple carbohydrates, increasing water intake, meal planning and cooking strategies, keeping healthy foods in the home, and planning for success.  [name] [name] has agreed to follow-up with our clinic in 4 weeks. He was informed of the importance of frequent follow-up visits to maximize his success with intensive lifestyle modifications for his multiple health conditions.   Objective:   Blood pressure 126/76, pulse 79, temperature 98 F (36.7 C), height 5\' 10"  (1.778 m), weight 224 lb (101.6 kg), SpO2 98 %. Body mass index is 32.14 kg/m.  General: Cooperative, alert, well developed, in no acute distress. HEENT: Conjunctivae and lids unremarkable. Cardiovascular: Regular rhythm.  Lungs: Normal work of breathing. Neurologic: No focal deficits.   Lab Results  Component Value Date   CREATININE 1.45 (H) 09/30/2021   BUN 20 09/30/2021   NA 136 09/30/2021   K 3.5 09/30/2021   CL 101 09/30/2021   CO2 27 09/30/2021   Lab Results  Component Value Date   ALT 15 09/30/2021   AST 16 09/30/2021   ALKPHOS 31 (L) 09/30/2021   BILITOT 0.8 09/30/2021   Lab Results  Component Value Date   HGBA1C 6.0 (H) 09/15/2021   HGBA1C 5.9 (H) 04/21/2021   HGBA1C 5.3 12/02/2020   HGBA1C 5.9 (H) 08/29/2020   HGBA1C 5.7 (H) 05/09/2020   Lab Results  Component Value Date   INSULIN 3.2 09/15/2021   INSULIN 5.8 08/29/2020   INSULIN 5.3 05/09/2020   Lab Results  Component Value Date   TSH 1.25 02/05/2020   Lab Results  Component Value Date  CHOL 134 04/21/2021   HDL 46 04/21/2021   LDLCALC 69 04/21/2021   LDLDIRECT 112.0 07/31/2016   TRIG 101 04/21/2021   CHOLHDL 2.9 04/21/2021   Lab Results  Component Value Date   VD25OH 98.8 09/15/2021   VD25OH 42.0 04/21/2021   VD25OH 59.0 01/28/2021   Lab Results  Component Value Date   WBC 9.3 09/30/2021   HGB 12.8 (L) 09/30/2021   HCT 37.8 (L)  09/30/2021   MCV 87.1 09/30/2021   PLT 303 09/30/2021   Attestation Statements:   Reviewed by clinician on day of visit: allergies, medications, problem list, medical history, surgical history, family history, social history, and previous encounter notes.  Time spent on visit including pre-visit chart review and post-visit care and charting was 35 minutes.   I, Water quality scientist, CMA, am acting as Location manager for Mina Marble, NP.  I have reviewed the above documentation for accuracy and completeness, and I agree with the above. -  Romonia Yanik d. Waymond Meador, NP-C

## 2021-10-16 DIAGNOSIS — Z20828 Contact with and (suspected) exposure to other viral communicable diseases: Secondary | ICD-10-CM | POA: Diagnosis not present

## 2021-11-03 ENCOUNTER — Other Ambulatory Visit: Payer: Self-pay | Admitting: Family Medicine

## 2021-11-11 ENCOUNTER — Encounter (INDEPENDENT_AMBULATORY_CARE_PROVIDER_SITE_OTHER): Payer: Self-pay | Admitting: Adult Health

## 2021-11-11 ENCOUNTER — Other Ambulatory Visit: Payer: Self-pay

## 2021-11-11 ENCOUNTER — Ambulatory Visit (INDEPENDENT_AMBULATORY_CARE_PROVIDER_SITE_OTHER): Payer: Medicare Other | Admitting: Adult Health

## 2021-11-11 VITALS — BP 148/77 | HR 89 | Temp 98.1°F | Ht 70.0 in | Wt 234.0 lb

## 2021-11-11 DIAGNOSIS — Z794 Long term (current) use of insulin: Secondary | ICD-10-CM

## 2021-11-11 DIAGNOSIS — F3289 Other specified depressive episodes: Secondary | ICD-10-CM | POA: Diagnosis not present

## 2021-11-11 DIAGNOSIS — E1169 Type 2 diabetes mellitus with other specified complication: Secondary | ICD-10-CM

## 2021-11-11 DIAGNOSIS — E669 Obesity, unspecified: Secondary | ICD-10-CM

## 2021-11-11 MED ORDER — ESCITALOPRAM OXALATE 20 MG PO TABS: 20.0000 mg | ORAL_TABLET | Freq: Every day | ORAL | 0 refills | Status: DC

## 2021-11-11 NOTE — Progress Notes (Signed)
Chief Complaint:   OBESITY Marquavis is here to discuss his progress with his obesity treatment plan along with follow-up of his obesity related diagnoses. [name] is on Nephrology dietary guidelines and states he is following his eating plan approximately 50% of the time. Jordi states he is doing water aerobics for 60 minutes 2 times per week.  Today's visit was #: 33 Starting weight: 273 lbs Starting date: 02/14/2018 Today's weight: 234 lbs Today's date: 11/11/2021 Total lbs lost to date: 39 lbs Total lbs lost since last in-office visit: 0  Interim History:  Life stressors: 1)  Holiday season. 2)  One of the family dog's underwent ACL reconstruction surgery- which has required Yahir to carry the recovering animal around the home/yard. 3)  On 11/18/2021 -his wife will undergo a total hip replacement with Dr. Alvan Dame.  Subjective:   1. Type 2 diabetes mellitus with other specified complication, with long-term current use of insulin (HCC) Jaze is on metformin 1000 mg BID, Levimir 50 units - PCP manages. Ozempic 2 mg - [facility] manages - unable to fill prescription - national drug shortage - been off >4 weeks. Fasting BG 100-130. He is frustrated with the inability obtain and/or afford injectable GLP-1  2. Other depression, with emotional eating  He is on Lexapro 20 mg QHS. He denies SI/HI.  He has worked with therapists in the past. Declined new referral at this time.  Assessment/Plan:   1. Type 2 diabetes mellitus with other specified complication, with long-term current use of insulin (HCC) Continue metformin and Levemir. Remain off Ozempic. Focus on reducing sugar/CHO intake.  2. Other depression, with emotional eating  Refill Lexapro 20 mg QHS, as per below.  - Refill escitalopram (LEXAPRO) 20 MG tablet; Take 1 tablet (20 mg total) by mouth at bedtime.  Dispense: 90 tablet; Refill: 0  3. Obesity, current BMI 33.7  Ciel is currently in the action stage of change. As  such, his goal is to continue with weight loss efforts. He has agreed to Nephrology dietary guidelines.   Exercise goals:  As is.  Behavioral modification strategies: meal planning and cooking strategies, keeping healthy foods in the home, and planning for success.  Quan has agreed to follow-up with our clinic in 6 weeks. He was informed of the importance of frequent follow-up visits to maximize his success with intensive lifestyle modifications for his multiple health conditions.   Objective:   Blood pressure (!) 148/77, pulse 89, temperature 98.1 F (36.7 C), height 5\' 10"  (1.778 m), weight 234 lb (106.1 kg), SpO2 99 %. Body mass index is 33.58 kg/m.  General: Cooperative, alert, well developed, in no acute distress. HEENT: Conjunctivae and lids unremarkable. Cardiovascular: Regular rhythm.  Lungs: Normal work of breathing. Neurologic: No focal deficits.   Lab Results  Component Value Date   CREATININE 1.45 (H) 09/30/2021   BUN 20 09/30/2021   NA 136 09/30/2021   K 3.5 09/30/2021   CL 101 09/30/2021   CO2 27 09/30/2021   Lab Results  Component Value Date   ALT 15 09/30/2021   AST 16 09/30/2021   ALKPHOS 31 (L) 09/30/2021   BILITOT 0.8 09/30/2021   Lab Results  Component Value Date   HGBA1C 6.0 (H) 09/15/2021   HGBA1C 5.9 (H) 04/21/2021   HGBA1C 5.3 12/02/2020   HGBA1C 5.9 (H) 08/29/2020   HGBA1C 5.7 (H) 05/09/2020   Lab Results  Component Value Date   INSULIN 3.2 09/15/2021   INSULIN 5.8 08/29/2020  INSULIN 5.3 05/09/2020   Lab Results  Component Value Date   TSH 1.25 02/05/2020   Lab Results  Component Value Date   CHOL 134 04/21/2021   HDL 46 04/21/2021   LDLCALC 69 04/21/2021   LDLDIRECT 112.0 07/31/2016   TRIG 101 04/21/2021   CHOLHDL 2.9 04/21/2021   Lab Results  Component Value Date   VD25OH 98.8 09/15/2021   VD25OH 42.0 04/21/2021   VD25OH 59.0 01/28/2021   Lab Results  Component Value Date   WBC 9.3 09/30/2021   HGB 12.8 (L)  09/30/2021   HCT 37.8 (L) 09/30/2021   MCV 87.1 09/30/2021   PLT 303 09/30/2021   Obesity Behavioral Intervention:   Approximately 15 minutes were spent on the discussion below.  ASK: We discussed the diagnosis of obesity with [name] today and [place] agreed to give [name] permission to discuss obesity behavioral modification therapy today.  ASSESS: Jareth has the diagnosis of obesity and his BMI today is 33.7. Kivon is in the action stage of change.   ADVISE: Gerrard was educated on the multiple health risks of obesity as well as the benefit of weight loss to improve his health. He was advised of the need for long term treatment and the importance of lifestyle modifications to improve his current health and to decrease his risk of future health problems.  AGREE: Multiple dietary modification options and treatment options were discussed and [place] agreed to follow the recommendations documented in the above note.  ARRANGE: Rhodes was educated on the importance of frequent visits to treat obesity as outlined per CMS and USPSTF guidelines and agreed to schedule his next follow up appointment today.  Attestation Statements:   Reviewed by clinician on day of visit: allergies, medications, problem list, medical history, surgical history, family history, social history, and previous encounter notes.  I, Water quality scientist, CMA, am acting as Location manager for Mina Marble, NP.  I have reviewed the above documentation for accuracy and completeness, and I agree with the above. -  Delberta Folts d. Daquane Aguilar, NP-C

## 2021-11-14 ENCOUNTER — Ambulatory Visit (HOSPITAL_COMMUNITY)
Admission: RE | Admit: 2021-11-14 | Discharge: 2021-11-14 | Disposition: A | Discharge status: Home / Self Care | Payer: Medicare Other | Source: Ambulatory Visit | Attending: Urology | Admitting: Urology

## 2021-11-14 ENCOUNTER — Other Ambulatory Visit: Payer: Self-pay

## 2021-11-14 DIAGNOSIS — N201 Calculus of ureter: Secondary | ICD-10-CM

## 2021-11-14 DIAGNOSIS — N202 Calculus of kidney with calculus of ureter: Secondary | ICD-10-CM | POA: Diagnosis not present

## 2021-11-19 ENCOUNTER — Other Ambulatory Visit: Payer: Self-pay

## 2021-11-19 ENCOUNTER — Ambulatory Visit (INDEPENDENT_AMBULATORY_CARE_PROVIDER_SITE_OTHER): Payer: Medicare Other | Admitting: Urology

## 2021-11-19 ENCOUNTER — Encounter: Payer: Self-pay | Admitting: Urology

## 2021-11-19 VITALS — BP 149/87 | HR 84 | Wt 234.0 lb

## 2021-11-19 DIAGNOSIS — N138 Other obstructive and reflux uropathy: Secondary | ICD-10-CM

## 2021-11-19 DIAGNOSIS — N401 Enlarged prostate with lower urinary tract symptoms: Secondary | ICD-10-CM | POA: Diagnosis not present

## 2021-11-19 DIAGNOSIS — R351 Nocturia: Secondary | ICD-10-CM

## 2021-11-19 DIAGNOSIS — N201 Calculus of ureter: Secondary | ICD-10-CM

## 2021-11-19 LAB — URINALYSIS, ROUTINE W REFLEX MICROSCOPIC
Bilirubin, UA: NEGATIVE
Glucose, UA: NEGATIVE
Leukocytes,UA: NEGATIVE
Nitrite, UA: NEGATIVE
Protein,UA: NEGATIVE
RBC, UA: NEGATIVE
Specific Gravity, UA: >1.03 — ABNORMAL HIGH (ref 1.005–1.030)
Urobilinogen, Ur: 0.2 mg/dL (ref 0.2–1.0)
pH, UA: 5.5 (ref 5.0–7.5)

## 2021-11-19 MED ORDER — TADALAFIL 5 MG PO TABS: 5.0000 mg | ORAL_TABLET | Freq: Every morning | ORAL | 11 refills | Status: DC

## 2021-11-19 NOTE — Progress Notes (Signed)
Urological Symptom Review  Patient is experiencing the following symptoms: Frequent urination Get up at night to urinate Erection problems (male only)   Review of Systems  Gastrointestinal (upper)  : Negative for upper GI symptoms  Gastrointestinal (lower) : Negative for lower GI symptoms  Constitutional : Fatigue  Skin: Negative for skin symptoms  Eyes: Negative for eye symptoms  Ear/Nose/Throat : Sinus problems  Hematologic/Lymphatic: Negative for Hematologic/Lymphatic symptoms  Cardiovascular : Negative for cardiovascular symptoms  Respiratory : Negative for respiratory symptoms  Endocrine: Negative for endocrine symptoms  Musculoskeletal: Negative for musculoskeletal symptoms  Neurological: Negative for neurological symptoms  Psychologic: Negative for psychiatric symptoms

## 2021-11-19 NOTE — Progress Notes (Signed)
11/19/2021 11:07 AM   [name] 1951/07/06 [id number]  Referring provider: Tammi Sou, MD [address] [place],  [place] [address]  Nephrolithiasis   HPI: Mr Poyer is a 70yo here for followup for nephrolithiasis. No stone events since last visit. Renal US shows no calculi and no hydronephrosis. No flank pain. He drinks 30oz of water daily. IPSS 12 QOL 2 with tadalafil 5mg  daily. No issues with erectile dysfunction while on tadalafil 5mg  daily.   PMH: Past Medical History:  Diagnosis Date   Allergic rhinitis, unspecified    Allergy testing via allergist 04/2017--mostly neg.  Dr. Donneta Romberg recommended referral to ENT so i did this.   Aortic root dilatation ([place]) 2019   2019, 41 mm, 2022, 39 mm-->rpt 1 yr.   Back pain    LBP->DDD/spondylosis w/ intermittent radiulopathy-type leg pains, + bilat hip and thigh pain-->spinal stenosis L4-5, L5-S1->Dr. Vertell Limber to do decomp and fusion surgery, scheduled for 12/03/20   BPH (benign prostatic hypertrophy)    no hydro on renal u/s 05/2020   Chronic nasal congestion    DDD (degenerative disc disease), lumbar 2010   laminectomy 01/2009.  MR rpt 05/2019->advanced DDD/spondylosis L3-S1, with mod/sev L4-5 spinal stenosis-->plan for surg per Dr. Vertell Limber as of 09/2020   Depression    Hospitalized for suicidal ideation 1992.  Has been on lexapro since 10/2008.   Diabetes mellitus type II    Dx'd approximately 06/2008.  No retinopathy as of 02/2017 ophth.   Erectile dysfunction    Heart murmur, systolic 10/1974   mild intensity, asymptomatic; echo 01/2018 valves fine->obs   History of colon polyps 01/2016   Recall 3 yrs   History of pneumonia 2008   Hospitalized   History of scarlet fever    age 39   Hyperlipidemia, mixed 1993   myalgias on pravastatin   Hypertension 2009   "marked chronic cardiomegaly" on CXR 01/2009 per old records.   Hypogonadism, male 01/11/2012   Axiron trial started 03/2012   Insomnia    Keratoconus     dx'd 1973   Memory changes    Mild cognitive impairment with memory loss 01/2020 eval with Dr. Delice Lesch   MRI brain 03/01/20 NORMAL   Nephrolithiasis    calcium oxalate   OSA on CPAP 2004; 2019   Back on CPAP 04/2018.  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 and surgery for this (?) around 1990    Surgical History: Past Surgical History:  Procedure Laterality Date   APPENDECTOMY  1972   COLONOSCOPY  X 4   Most recent was 2007 [place], [place]), with removal of polyps in the first two.  02/21/16: tubular adenoma.  Recall 3 yrs (Dr. Ardis Hughs).   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 Vertell Limber)   [place]     age 71   TRANSTHORACIC ECHOCARDIOGRAM  02/22/2018   EF 55-60%, grd I DD. Aortic root dilation 41 mm.  05/2021 echo unchanged except aortic root dilation down to 39 mm. Plan rpt 1 yr to monitor aortic root dilation.   URETHRAL DILATION     2 as a child, one as an adult   VASECTOMY  1994   VITRECTOMY Left     Home Medications:  Allergies as of 11/19/2021       Reactions   Simvastatin Other (See Comments)   myalgias   Antihistamines, Diphenhydramine-type Other (See Comments)   depression    Codeine Nausea Only   Penicillins Hives   Sulfa Antibiotics Hives        Medication List        Accurate as of November 19, 2021 11:07 AM. If you have any questions, ask your nurse or doctor.          aspirin 81 MG EC tablet Take 1 tablet (81 mg total) by mouth daily.   atorvastatin 80 MG tablet Commonly known as: LIPITOR TAKE 1 TABLET(80 MG) BY MOUTH DAILY   azelastine 0.1 % nasal spray Commonly known as: ASTELIN Place 2 sprays into both nostrils 2 (two) times daily. Use in each nostril as directed What changed:  when to take this reasons to take this additional instructions   BD Pen Needle Nano 2nd Gen 32G X 4 MM Misc Generic drug: Insulin Pen Needle USE TO INJECT 4 TIMES DAILY   buPROPion 150 MG 12 hr tablet Commonly known as: WELLBUTRIN SR Take 1 tablet (150 mg total) by mouth 2 (two) times daily.   escitalopram 20 MG tablet Commonly known as: LEXAPRO Take 1 tablet (20 mg total) by mouth at bedtime.   Fenofibric Acid 135 MG Cpdr TAKE 1 CAPSULE BY MOUTH DAILY   finasteride 5 MG tablet Commonly known as: PROSCAR TAKE 1 TABLET(5 MG) BY MOUTH DAILY   fluticasone 50 MCG/ACT nasal spray Commonly known as: FLONASE Place 2 sprays into both nostrils daily. What changed:  when to take this reasons to take this   gabapentin 300 MG capsule Commonly known as: NEURONTIN Take 900 mg by mouth at bedtime.   glucose blood test strip Per Insurance Preference- Test sugars up to three times daily DX E11.9   glucose blood test strip Commonly known as: ONE TOUCH ULTRA TEST 1 each by Other route 3 (three) times daily. Test sugars up to three times daily. ( insurance preference  DX E11.65 E11.9)   HYDROcodone-acetaminophen 5-325 MG tablet Commonly known as: NORCO/VICODIN Take 1 tablet by mouth every 6 (six) hours as needed for moderate pain or severe pain. When not taking the oxy   HYDROmorphone 2 MG tablet Commonly known as: Dilaudid Take 1 tablet (2 mg total)  by mouth every 6 (six) hours as needed for severe pain.   Krill Oil 500 MG Caps Take 500 mg by mouth at bedtime.   Levemir FlexTouch 100 UNIT/ML FlexPen Generic drug: insulin detemir Inject 50 Units into the skin every evening.   metFORMIN 1000 MG tablet Commonly known as: GLUCOPHAGE TAKE 1 TABLET BY MOUTH TWICE DAILY WITH A MEAL   methocarbamol 500 MG tablet Commonly known as: ROBAXIN Take 500 mg by mouth daily as needed for muscle spasms.   ondansetron 4 MG disintegrating tablet Commonly known as: Zofran ODT  Take 1 tablet (4 mg total) by mouth every 8 (eight) hours as needed for nausea or vomiting.   Ozempic (2 MG/DOSE) 8 MG/3ML Sopn Generic drug: Semaglutide (2 MG/DOSE) Inject 2 mg into the skin once a week. What changed: when to take this   sodium chloride 0.65 % nasal spray Commonly known as: OCEAN Place 1 spray into the nose daily as needed for congestion.   tadalafil 5 MG tablet Commonly known as: CIALIS Take 5 mg by mouth every morning.   tamsulosin 0.4 MG Caps capsule Commonly known as: FLOMAX Take 1 capsule (0.4 mg total) by mouth once for 1 dose. What changed: when to take this   vitamin B-12 1000 MCG tablet Commonly known as: CYANOCOBALAMIN Take 2,000 mcg by mouth at bedtime.        Allergies:  Allergies  Allergen Reactions   Simvastatin Other (See Comments)    myalgias   Antihistamines, Diphenhydramine-Type Other (See Comments)    depression   Codeine Nausea Only   Penicillins Hives   Sulfa Antibiotics Hives    Family History: Family History  Problem Relation Age of Onset   Heart disease Mother    Hyperlipidemia Mother    Stroke Mother    Diabetes Mother    Depression Mother    Alzheimer's disease Mother    Stroke Father    Hyperlipidemia Father    Heart disease Father    Diabetes Father    Hypertension Father    Obesity Father    Alcohol abuse Sister    Depression Sister     Social History:  reports that he quit smoking about  40 years ago. His smoking use included cigars. He has never used smokeless tobacco. He reports current alcohol use. He reports that he does not use drugs.  ROS: All other review of systems were reviewed and are negative except what is noted above in HPI  Physical Exam: BP (!) 149/87    Pulse 84    Wt 234 lb (106.1 kg)    BMI 33.58 kg/m   Constitutional:  Alert and oriented, No acute distress. HEENT: [place] AT, moist mucus membranes.  Trachea midline, no masses. Cardiovascular: No clubbing, cyanosis, or edema. Respiratory: Normal respiratory effort, no increased work of breathing. GI: Abdomen is soft, nontender, nondistended, no abdominal masses GU: No CVA tenderness.  Lymph: No cervical or inguinal lymphadenopathy. Skin: No rashes, bruises or suspicious lesions. Neurologic: Grossly intact, no focal deficits, moving all 4 extremities. Psychiatric: Normal mood and affect.  Laboratory Data: Lab Results  Component Value Date   WBC 9.3 09/30/2021   HGB 12.8 (L) 09/30/2021   HCT 37.8 (L) 09/30/2021   MCV 87.1 09/30/2021   PLT 303 09/30/2021    Lab Results  Component Value Date   CREATININE 1.45 (H) 09/30/2021    Lab Results  Component Value Date   PSA 0.02 (L) 05/22/2021   PSA 0.08 (L) 05/16/2020   PSA 0.04 (L) 05/19/2019    Lab Results  Component Value Date   TESTOSTERONE 87.17 (L) 04/13/2012    Lab Results  Component Value Date   HGBA1C 6.0 (H) 09/15/2021    Urinalysis    Component Value Date/Time   COLORURINE YELLOW 09/30/2021 1102   APPEARANCEUR Clear 10/08/2021 0938   LABSPEC 1.004 (L) 09/30/2021 1102   PHURINE 6.0 09/30/2021 1102   GLUCOSEU Negative 10/08/2021 0938   HGBUR LARGE (A) 09/30/2021 1102   BILIRUBINUR Negative 10/08/2021 [place] 09/30/2021 1102  PROTEINUR Negative 10/08/2021 [place] 09/30/2021 1102   NITRITE Negative 10/08/2021 0938   NITRITE NEGATIVE 09/30/2021 1102   LEUKOCYTESUR Negative 10/08/2021 0938    LEUKOCYTESUR TRACE (A) 09/30/2021 1102    Lab Results  Component Value Date   LABMICR See below: 10/08/2021   WBCUA None seen 10/08/2021   LABEPIT 0-10 10/08/2021   MUCUS Present 10/08/2021   BACTERIA Few (A) 10/08/2021    Pertinent Imaging: Renal US 11/14/2021: Images reviewed  No results found for this or any previous visit.  No results found for this or any previous visit.  No results found for this or any previous visit.  No results found for this or any previous visit.  Results for orders placed during the hospital encounter of 11/14/21  Ultrasound renal complete  Narrative CLINICAL DATA:  Ureteral stone, nephrolithiasis follow-up  EXAM: RENAL / URINARY TRACT ULTRASOUND COMPLETE  COMPARISON:  Is CT abdomen pelvis 09/30/2021  FINDINGS: Right Kidney:  Renal measurements: 12.3 x 6.4 x 6.0 cm = volume: 249 mL. Echogenicity within normal limits. No mass or hydronephrosis visualized. No shadowing calculus identified.  Left Kidney:  Renal measurements: 12.9 x 6.8 x 5.9 cm = volume: 272 mL. Echogenicity within normal limits. No mass or hydronephrosis visualized. No shadowing calculus identified.  Bladder:  Appears normal for degree of bladder distention.  Other:  None.  IMPRESSION: No evidence of hydronephrosis or shadowing calculus.   Electronically Signed By: Audie Pinto M.D. On: 11/15/2021 14:31  No results found for this or any previous visit.  No results found for this or any previous visit.  Results for orders placed during the hospital encounter of 09/30/21  CT Renal Stone Study  Narrative CLINICAL DATA:  Bilateral flank pain, recent lithotripsy  EXAM: CT ABDOMEN AND PELVIS WITHOUT CONTRAST  TECHNIQUE: Multidetector CT imaging of the abdomen and pelvis was performed following the standard protocol without IV contrast.  COMPARISON:  09/25/2021  FINDINGS: Lower chest: Small linear densities in the lower lung fields  may suggest scarring or minimal subsegmental atelectasis. 4 mm nodule seen in the right lower lobe in the previous examination appears to be obscured by small linear subsegmental atelectasis. There are scattered coronary artery calcifications.  Hepatobiliary: No focal abnormality is seen in liver. Liver measures 18.2 cm in length. There are multiple calcified gallbladder stones. There is no dilation of bile ducts.  Pancreas: No focal abnormality is seen.  Spleen: Unremarkable.  Adrenals/Urinary Tract: Adrenals are not enlarged. There is no hydronephrosis. There is interval passage or removal of right ureteral calculus. There is interval placement of left ureteral stent. Calculus seen in the distal course of left ureter in the previous study is not seen in the current study. In image 59, small pocket of air is seen in the distal left ureter which may be related to instrumentation. There is mild stranding adjacent to the renal pelvis and ureters on both sides, possibly due to recent ureteric obstruction and instrumentation. Possibility pyelonephritis is not excluded. There are no demonstrable renal stones. Urinary bladder is not distended. Small pockets of air in the urinary bladder most likely related to recent instrumentation.  Stomach/Bowel: Stomach is unremarkable. Small bowel loops are not dilated. Appendix is not distinctly seen. There is no focal pericecal inflammation. There is no significant wall thickening in colon. Few diverticula are seen.  Vascular/Lymphatic: There are scattered arterial calcifications. No significant lymphadenopathy is seen.  Reproductive: Prostate appears smaller than usual in size.  Other: There is  no ascites or pneumoperitoneum.  Musculoskeletal: There is previous surgical fusion from L4-S1 levels.  IMPRESSION: There is interval passage or removal of bilateral ureteral stones. There is interval decrease in bilateral hydronephrosis.  Left ureteral stent is noted. There is stranding in the fat planes adjacent to renal pelves and ureters on both sides which may be related to recent passage of ureteral calculi and instrumentation. Possibility of pyelonephritis is not excluded.  There is no evidence of intestinal obstruction or pneumoperitoneum. Gallbladder stones. There is no dilation of bile ducts. Coronary artery calcifications are seen. Diverticulosis of colon.  Other findings as described in the body of the report.   Electronically Signed By: Elmer Picker M.D. On: 09/30/2021 12:11   Assessment & Plan:    1. Ureteral stone -increase water intake. RTC 6 months with a renal US - Urinalysis, Routine w reflex microscopic  2. BPH with nocturia -Tadalafil 5mg  daily   No follow-ups on file.  Nicolette Bang, MD  [facility] [place]

## 2021-11-21 ENCOUNTER — Ambulatory Visit: Payer: Medicare Other | Admitting: Family Medicine

## 2021-11-25 ENCOUNTER — Encounter: Payer: Self-pay | Admitting: Urology

## 2021-11-25 NOTE — Patient Instructions (Signed)

## 2021-12-09 ENCOUNTER — Encounter: Payer: Self-pay | Admitting: Family Medicine

## 2021-12-09 ENCOUNTER — Other Ambulatory Visit: Payer: Self-pay

## 2021-12-09 ENCOUNTER — Ambulatory Visit (INDEPENDENT_AMBULATORY_CARE_PROVIDER_SITE_OTHER): Payer: Medicare Other | Admitting: Family Medicine

## 2021-12-09 ENCOUNTER — Ambulatory Visit (INDEPENDENT_AMBULATORY_CARE_PROVIDER_SITE_OTHER): Payer: Medicare Other | Admitting: Adult Health

## 2021-12-09 VITALS — BP 142/77 | HR 81 | Temp 98.0°F | Ht 70.0 in | Wt 249.0 lb

## 2021-12-09 DIAGNOSIS — N139 Obstructive and reflux uropathy, unspecified: Secondary | ICD-10-CM | POA: Diagnosis not present

## 2021-12-09 DIAGNOSIS — E119 Type 2 diabetes mellitus without complications: Secondary | ICD-10-CM | POA: Diagnosis not present

## 2021-12-09 DIAGNOSIS — F411 Generalized anxiety disorder: Secondary | ICD-10-CM | POA: Diagnosis not present

## 2021-12-09 DIAGNOSIS — J0191 Acute recurrent sinusitis, unspecified: Secondary | ICD-10-CM

## 2021-12-09 DIAGNOSIS — Z794 Long term (current) use of insulin: Secondary | ICD-10-CM

## 2021-12-09 DIAGNOSIS — F339 Major depressive disorder, recurrent, unspecified: Secondary | ICD-10-CM

## 2021-12-09 DIAGNOSIS — G4701 Insomnia due to medical condition: Secondary | ICD-10-CM

## 2021-12-09 DIAGNOSIS — E782 Mixed hyperlipidemia: Secondary | ICD-10-CM | POA: Diagnosis not present

## 2021-12-09 LAB — COMPREHENSIVE METABOLIC PANEL
ALT: 21 U/L (ref 0–53)
AST: 15 U/L (ref 0–37)
Albumin: 4.4 g/dL (ref 3.5–5.2)
Alkaline Phosphatase: 33 U/L — ABNORMAL LOW (ref 39–117)
BUN: 21 mg/dL (ref 6–23)
CO2: 28 mEq/L (ref 19–32)
Calcium: 9.9 mg/dL (ref 8.4–10.5)
Chloride: 99 mEq/L (ref 96–112)
Creatinine, Ser: 0.9 mg/dL (ref 0.40–1.50)
GFR: 87.46 mL/min (ref >60.00)
Glucose, Bld: 98 mg/dL (ref 70–99)
Potassium: 4.1 mEq/L (ref 3.5–5.1)
Sodium: 136 mEq/L (ref 135–145)
Total Bilirubin: 0.5 mg/dL (ref 0.2–1.2)
Total Protein: 6.9 g/dL (ref 6.0–8.3)

## 2021-12-09 LAB — HEMOGLOBIN A1C: Hgb A1c MFr Bld: 6.7 % — ABNORMAL HIGH (ref 4.6–6.5)

## 2021-12-09 MED ORDER — FENOFIBRIC ACID 135 MG PO CPDR: 1.0000 | DELAYED_RELEASE_CAPSULE | Freq: Every day | ORAL | 3 refills | Status: DC

## 2021-12-09 MED ORDER — FINASTERIDE 5 MG PO TABS: ORAL_TABLET | ORAL | 3 refills | Status: DC

## 2021-12-09 MED ORDER — MIRTAZAPINE 7.5 MG PO TABS: ORAL_TABLET | ORAL | 1 refills | Status: DC

## 2021-12-09 MED ORDER — PREDNISONE 20 MG PO TABS: ORAL_TABLET | ORAL | 0 refills | Status: DC

## 2021-12-09 MED ORDER — TAMSULOSIN HCL 0.4 MG PO CAPS: 0.4000 mg | ORAL_CAPSULE | Freq: Every day | ORAL | 3 refills | Status: DC

## 2021-12-09 MED ORDER — CLINDAMYCIN HCL 300 MG PO CAPS: 300.0000 mg | ORAL_CAPSULE | Freq: Three times a day (TID) | ORAL | 0 refills | Status: DC

## 2021-12-09 NOTE — Progress Notes (Signed)
OFFICE VISIT  12/09/2021  CC:  Chief Complaint  Patient presents with   Follow-up    RCI; not fasting   HPI:    Patient is a 71 y.o. male who presents for 6 mo f/u DM 2, mixed HLD, recurrent MDD + anxiety. A/P as of last visit: "1) DM 2.  Glucoses excellent, even a couple bordering on hypoglycemia. Wt gain->? Related to high dose of levemir. Lets dec levemir from 56 to 50 U qd.  I'll have him check to see if his insurer covers a SGL2-I. If we add one of these I will decrease lantus to 45 U.  Cont metformin.  Bariatric clinic adding ozempic soon. Next a1c around 07/22/21.   2) HLD: tolerating 80 atorva qd and fenofibrate 135 mg qd. LDL 69, trigs 101 on 04/21/21.  Hepatic panel normal at that time. Next lipid panel check 6-12 mo.   3) Anx/dep: stable on bupropion and lexapro.   4) BPH with very significant LUT obstructive sx's despite being on flomax and finasteride. Referred to urology today.   5) Systolic heart murmur: asymptomatic. Present back in 2019 when echo showed no valvular abnormality. Plan f/u echo later in 2022-->ordered.   6) Health maintenance exam: Reviewed age and gender appropriate health maintenance issues (prudent diet, regular exercise, health risks of tobacco and excessive alcohol, use of seatbelts, fire alarms in home, use of sunscreen).  Also reviewed age and gender appropriate health screening as well as vaccine recommendations. Vaccines: ALL UTD. Labs: bariatric clinic has been following lipids, K2H, and metabolic panels, most recent 04/21/21.  Will do cbc today. Prostate ca screening: PSA today. Colon ca screening:  Hx of adenomatous colon polyps: due for repeat colonoscopy as of 2020---he's been holding off while getting back issue worked out. I gave him [facility] contact # so he could call to arrange this."  INTERIM HX: Feeling rundown lately. Says he is not sleeping well--some of it is his chronic anxiety, some is acute worsening of chronic nasal  congestion for the last month that has been preventing him using his CPAP lately.  He gets up 3 times a night to urinate but says usually in the past he has been able to get back to sleep easily but not lately. All of this has been feeling kind of foggy and unwell. No cough, wheezing, shortness of breath, or fever.  Intermittently he will feel sinus tenderness.  No asymmetry to his tenderness.  No home blood pressure monitoring. Home glucoses ranging 80-1 60 on 50 units of Levemir daily and metformin 1000 twice daily.  He stopped Ozempic due to cost, although he did feel like his glucoses were better regulated on this.  In November 2022 he had bilateral obstructive ureterolithiasis.  He did have some bilateral hydronephrosis.  He got successful ureteroscopy with stone extraction. Follow-up imaging 6 weeks after procedure showed no hydronephrosis, no stones. Most recent serum creatinine was shortly after his procedure on 09/30/2021 and was 1.45.  Also of note, he has seen Dr. Junious Silk for urethral stricture and got a cystoscopy with urethral dilation.  Now he urinates with good stream, very relieved about this. Getting ongoing treatment for BPH with Cialis 5 mg a day, finasteride 5 mg a day and Flomax 0.4 mg a day.  ROS as above, plus--> he does have intermittent feeling of lightheadedness, not clearly related to position changes, not described as vertiginous.  No nausea with this, no ringing in the ears or hearing impairment.  He  does not feel presyncopal and has had no falls.  No HAs, no rashes, no melena/hematochezia.  No polyuria or polydipsia.  No myalgias or arthralgias.  No focal weakness, paresthesias, or tremors.  No acute vision or hearing abnormalities.  No dysuria or unusual/new urinary urgency or frequency.  No recent changes in lower legs. No n/v/d or abd pain.  No palpitations.     Past Medical History:  Diagnosis Date   Allergic rhinitis, unspecified    Allergy testing via  allergist 04/2017--mostly neg.  Dr. Donneta Romberg recommended referral to ENT so i did this.   Aortic root dilatation ([place]) 2019   2019, 41 mm, 2022, 39 mm-->rpt 1 yr.   Back pain    LBP->DDD/spondylosis w/ intermittent radiulopathy-type leg pains, + bilat hip and thigh pain-->spinal stenosis L4-5, L5-S1->Dr. Vertell Limber to do decomp and fusion surgery, scheduled for 12/03/20   BPH (benign prostatic hypertrophy)    no hydro on renal u/s 05/2020   Chronic nasal congestion    DDD (degenerative disc disease), lumbar 2010   laminectomy 01/2009.  MR rpt 05/2019->advanced DDD/spondylosis L3-S1, with mod/sev L4-5 spinal stenosis-->plan for surg per Dr. Vertell Limber as of 09/2020   Depression    Hospitalized for suicidal ideation 1992.  Has been on lexapro since 10/2008.   Diabetes mellitus type II    Dx'd approximately 06/2008.  No retinopathy as of 02/2017 ophth.   Erectile dysfunction    Heart murmur, systolic 62/7035   mild intensity, asymptomatic; echo 01/2018 valves fine->obs   History of colon polyps 01/2016   Recall 3 yrs   History of pneumonia 2008   Hospitalized   History of scarlet fever    age 18   Hyperlipidemia, mixed 1993   myalgias on pravastatin   Hypertension 2009   "marked chronic cardiomegaly" on CXR 01/2009 per old records.   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 Delice Lesch   MRI brain 03/01/20 NORMAL   Nephrolithiasis    calcium oxalate   OSA on CPAP 2004; 2019   Back on CPAP 04/2018.  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 and surgery for this (?) around [place]    Past Surgical History:  Procedure Laterality Date   [place]  X 4    Most recent was 2007 [place], [place]), with removal of polyps in the first two.  02/21/16: tubular adenoma.  Recall 3 yrs (Dr. Ardis Hughs).   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 Vertell Limber)   [place]  age 70   TRANSTHORACIC ECHOCARDIOGRAM  02/22/2018   EF 55-60%, grd I DD. Aortic root dilation 41 mm.  05/2021 echo unchanged except aortic root dilation down to 39 mm. Plan rpt 1 yr to monitor aortic root dilation.   URETHRAL DILATION     2 as a child, one as an adult   VASECTOMY  1994   VITRECTOMY Left     Outpatient Medications Prior to Visit  Medication Sig Dispense Refill   aspirin 81 MG EC tablet Take 1 tablet (81 mg total) by mouth daily. 30 tablet 12   atorvastatin (LIPITOR) 80 MG tablet TAKE 1 TABLET(80 MG) BY MOUTH DAILY 30 tablet 0   azelastine (ASTELIN) 0.1 % nasal spray Place 2 sprays into both nostrils 2 (two) times daily. Use in each nostril as directed (Patient taking differently: Place 2 sprays into both nostrils daily as needed for rhinitis.) 30 mL 11   buPROPion (WELLBUTRIN SR) 150 MG 12 hr tablet Take 1 tablet (150 mg total) by mouth 2 (two) times daily. 180 tablet 0   escitalopram (LEXAPRO) 20 MG tablet Take 1 tablet (20 mg total) by mouth at bedtime. 90 tablet 0   fluticasone (FLONASE) 50 MCG/ACT nasal spray Place 2 sprays into both nostrils daily. (Patient taking  differently: Place 2 sprays into both nostrils daily as needed for allergies.) 48 g 1   gabapentin (NEURONTIN) 300 MG capsule Take 900 mg by mouth at bedtime.     glucose blood (ONE TOUCH ULTRA TEST) test strip 1 each by Other route 3 (three) times daily. Test sugars up to three times daily. ( insurance preference  DX E11.65 E11.9) 100 each 11   glucose blood test strip Per Insurance Preference- Test sugars up to three times daily DX E11.9 100 each 11   insulin detemir (LEVEMIR FLEXTOUCH) 100 UNIT/ML FlexPen Inject 50 Units into the skin every evening. 15 mL 6   Insulin Pen Needle (BD PEN NEEDLE NANO 2ND GEN) 32G X 4 MM MISC USE TO INJECT 4 TIMES DAILY 100 each 2   Krill Oil 500 MG CAPS Take 500 mg by mouth at bedtime.     metFORMIN (GLUCOPHAGE) 1000 MG tablet TAKE 1 TABLET BY MOUTH TWICE DAILY WITH A MEAL 180 tablet 1   Semaglutide, 2 MG/DOSE, (OZEMPIC, 2 MG/DOSE,) 8 MG/3ML SOPN Inject 2 mg into the skin once a week. (Patient taking differently: Inject 2 mg into the skin every Sunday.) 9 mL 0   sodium chloride (OCEAN) 0.65 % nasal spray Place 1 spray into the nose daily as needed for congestion.     vitamin B-12 (CYANOCOBALAMIN) 1000 MCG tablet Take 2,000 mcg by mouth at bedtime.     Choline Fenofibrate (FENOFIBRIC ACID) 135 MG CPDR TAKE 1 CAPSULE BY MOUTH DAILY 30 capsule 0   finasteride (PROSCAR) 5 MG tablet TAKE 1 TABLET(5 MG) BY MOUTH DAILY 30 tablet 0   tadalafil (CIALIS) 5 MG tablet Take 1 tablet (5 mg total) by mouth every morning. (Patient not taking: Reported on 12/09/2021) 30 tablet 11   HYDROcodone-acetaminophen (NORCO/VICODIN) 5-325 MG tablet Take 1 tablet by mouth every 6 (six) hours as needed for moderate pain or severe pain. When not taking the oxy (Patient not taking: Reported on 12/09/2021)     HYDROmorphone (DILAUDID) 2 MG tablet Take 1 tablet (2 mg total) by mouth every 6 (six) hours as needed for severe pain. (Patient not taking: Reported on 12/09/2021) 20 tablet 0   methocarbamol  (ROBAXIN) 500 MG  tablet Take 500 mg by mouth daily as needed for muscle spasms. (Patient not taking: Reported on 12/09/2021)     ondansetron (ZOFRAN ODT) 4 MG disintegrating tablet Take 1 tablet (4 mg total) by mouth every 8 (eight) hours as needed for nausea or vomiting. (Patient not taking: Reported on 12/09/2021) 20 tablet 0   tamsulosin (FLOMAX) 0.4 MG CAPS capsule Take 1 capsule (0.4 mg total) by mouth once for 1 dose. (Patient taking differently: Take 0.4 mg by mouth at bedtime.) 1 capsule 0   No facility-administered medications prior to visit.    Allergies  Allergen Reactions   Simvastatin Other (See Comments)    myalgias   Antihistamines, Diphenhydramine-Type Other (See Comments)    depression   Codeine Nausea Only   Penicillins Hives   Sulfa Antibiotics Hives    ROS As per HPI  PE: Vitals with BMI 12/09/2021 11/19/2021 11/11/2021  Height 5\' 10"  - 5\' 10"   Weight 249 lbs 234 lbs 234 lbs  BMI 35.73 77.41 28.78  Systolic 676 720 947  Diastolic 77 87 77  Pulse 81 84 89     Physical Exam  VS: noted--normal. Gen: alert, NAD, NONTOXIC APPEARING. HEENT: eyes without injection, drainage, or swelling.  Ears: EACs clear, TMs with normal light reflex and landmarks.  Nose: I do not appreciate any significant nasal septal deviation.  No active rhinorrhea, has some some dried, crusty exudate adherent to mildly injected mucosa.  No purulent d/c.  No paranasal sinus TTP.  No facial swelling.  Throat and mouth without focal lesion.  No pharyngial swelling, erythema, or exudate.   Neck: supple, no LAD.   LUNGS: CTA bilat, nonlabored resps.   CV: RRR, no m/r/g. EXT: No cyanosis.  He has 2+ bilateral lower extremity pitting edema  SKIN: no rash  LABS:  Last CBC Lab Results  Component Value Date   WBC 9.3 09/30/2021   HGB 12.8 (L) 09/30/2021   HCT 37.8 (L) 09/30/2021   MCV 87.1 09/30/2021   MCH 29.5 09/30/2021   RDW 13.1 09/30/2021   PLT 303 09/30/2021  No results found for:  IRON, TIBC, FERRITIN  Last metabolic panel Lab Results  Component Value Date   GLUCOSE 140 (H) 09/30/2021   NA 136 09/30/2021   K 3.5 09/30/2021   CL 101 09/30/2021   CO2 27 09/30/2021   BUN 20 09/30/2021   CREATININE 1.45 (H) 09/30/2021   GFRNONAA 52 (L) 09/30/2021   CALCIUM 9.6 09/30/2021   PROT 7.2 09/30/2021   ALBUMIN 4.1 09/30/2021   LABGLOB 2.3 09/15/2021   AGRATIO 2.0 09/15/2021   BILITOT 0.8 09/30/2021   ALKPHOS 31 (L) 09/30/2021   AST 16 09/30/2021   ALT 15 09/30/2021   ANIONGAP 8 09/30/2021   Last lipids Lab Results  Component Value Date   CHOL 134 04/21/2021   HDL 46 04/21/2021   LDLCALC 69 04/21/2021   LDLDIRECT 112.0 07/31/2016   TRIG 101 04/21/2021   CHOLHDL 2.9 04/21/2021   Last hemoglobin A1c Lab Results  Component Value Date   HGBA1C 6.0 (H) 09/15/2021   Last thyroid functions Lab Results  Component Value Date   TSH 1.25 02/05/2020   T3TOTAL 108 02/14/2018   IMPRESSION AND PLAN:  #1 insomnia.  Combination of anxiety/stress and acute sinusitis symptoms preventing use of his CPAP.  Want to avoid benzo or Ambien. Will cautiously start mirtazapine 7.5 mg, 1-2 nightly for sleep.  #2 acute sinusitis, superimposed on some chronic rhinitis issues. He is on a  daily nasal steroid over-the-counter and does saline moisturization and irrigation faithfully. Will treat with clindamycin (penicillin allergy) x10 days as well as prednisone 40 mg a day x5 days, then 20 mg a day x5 days.  #3 diabetes type 2 with out complications. Control has been good. A1c today.  He has slipped back into some bad habits from a diet standpoint and is going to try to change this. Continue Levemir 50 units daily and metformin 1000 twice daily.  He is unwilling to continue Ozempic due to cost.  4.  Recurrent major depressive disorder, GAD. No acute depressive episode at this time but his anxiety is bothering him more lately. This is largely to do with the increase in medical  issues lately. Continue bupropion SR 150 twice a day and Lexapro 20 mg a day.  As noted in #1 above, cautiously starting mirtazapine for sleep.  #5 HLD: tolerating 80 atorva qd and fenofibrate 135 mg qd. LDL 69, trigs 101 on 04/21/21.  Hepatic panel today.   Next lipid panel check 4 to 6 months.  #6 BPH with lower urinary tract obstructive symptoms.  History of urethral stricture. His urethral stricture has been taken care of by urologist. Ongoing treatment for BPH with tamsulosin, finasteride, and most recently Cialis 5 mg/day was added on.  #7 history of obstructive uropathy.  His bilateral ureteral stone obstruction was cleared by the urologist.  Follow-up renal ultrasound after procedure showed hydronephrosis had resolved. Will follow-up electrolytes and kidney function today.  An After Visit Summary was printed and given to the patient.  FOLLOW UP: Return in about 4 weeks (around 01/06/2022) for 4 wks f/u insomnia and sinuses.  Signed:  Crissie Sickles, MD           12/09/2021

## 2021-12-15 ENCOUNTER — Other Ambulatory Visit: Payer: Self-pay | Admitting: Family Medicine

## 2021-12-20 ENCOUNTER — Other Ambulatory Visit (INDEPENDENT_AMBULATORY_CARE_PROVIDER_SITE_OTHER): Payer: Self-pay | Admitting: Adult Health

## 2021-12-20 DIAGNOSIS — F3289 Other specified depressive episodes: Secondary | ICD-10-CM

## 2021-12-22 NOTE — Telephone Encounter (Signed)
LAST APPOINTMENT DATE: 11/11/21 NEXT APPOINTMENT DATE: 12/25/21   [facility] [phone] - [place], [place] [address] [name] [address] Phone: [phone] Fax: [phone]  [facility] [address], [place] - [place] [address] [place] [address] [place] [address] Phone: [phone] Fax: [phone]  Patient is requesting a refill of the following medications: Pending Prescriptions:                       Disp   Refills   buPROPion (WELLBUTRIN SR) 150 MG 12 hr tab*180 ta*0       Sig: TAKE 1 TABLET(150 MG) BY MOUTH TWICE DAILY   Date last filled: 11/15/21 Previously prescribed by [facility]  Lab Results      Component                Value               Date                      HGBA1C                   6.7 (H)             12/09/2021                HGBA1C                   6.0 (H)             09/15/2021                HGBA1C                   5.9 (H)             04/21/2021           Lab Results      Component                Value               Date                      MICROALBUR               <0.7                05/16/2020                [place]                  69                  04/21/2021                CREATININE               0.90                12/09/2021           Lab Results      Component                Value               Date                      VD25OH  98.8                09/15/2021                VD25OH                   42.0                04/21/2021                VD25OH                   59.0                01/28/2021            BP Readings from Last 3 Encounters: 12/09/21 : (!) 142/77 11/19/21 : (!) 149/87 11/11/21 : (!) 148/77

## 2021-12-23 ENCOUNTER — Ambulatory Visit (INDEPENDENT_AMBULATORY_CARE_PROVIDER_SITE_OTHER): Payer: Medicare Other | Admitting: Adult Health

## 2021-12-25 ENCOUNTER — Encounter (INDEPENDENT_AMBULATORY_CARE_PROVIDER_SITE_OTHER): Payer: Self-pay | Admitting: Adult Health

## 2021-12-25 ENCOUNTER — Other Ambulatory Visit: Payer: Self-pay

## 2021-12-25 ENCOUNTER — Ambulatory Visit (INDEPENDENT_AMBULATORY_CARE_PROVIDER_SITE_OTHER): Payer: Medicare Other | Admitting: Adult Health

## 2021-12-25 VITALS — BP 139/79 | HR 90 | Temp 98.1°F | Ht 70.0 in | Wt 252.0 lb

## 2021-12-25 DIAGNOSIS — E119 Type 2 diabetes mellitus without complications: Secondary | ICD-10-CM

## 2021-12-25 DIAGNOSIS — Z794 Long term (current) use of insulin: Secondary | ICD-10-CM | POA: Diagnosis not present

## 2021-12-25 DIAGNOSIS — E669 Obesity, unspecified: Secondary | ICD-10-CM | POA: Diagnosis not present

## 2021-12-25 DIAGNOSIS — Z6836 Body mass index (BMI) 36.0-36.9, adult: Secondary | ICD-10-CM | POA: Diagnosis not present

## 2021-12-25 MED ORDER — OZEMPIC (0.25 OR 0.5 MG/DOSE) 2 MG/1.5ML ~~LOC~~ SOPN: 0.2500 mg | PEN_INJECTOR | SUBCUTANEOUS | 0 refills | Status: DC

## 2021-12-25 NOTE — Progress Notes (Signed)
Chief Complaint:   OBESITY Chanson is here to discuss his progress with his obesity treatment plan along with follow-up of his obesity related diagnoses. Izaiah is on Nephrology diet guidelines and states he is following his eating plan approximately 40% of the time. Cayetano states he is not exercising regularly at this time.  Today's visit was #: 15 Starting weight: 273 lbs Starting date: 02/14/2018 Today's weight: 252 lbs Today's date: 12/25/2021 Total lbs lost to date: 21 lbs Total lbs lost since last in-office visit: 0  Interim History:  Since last OV- Makhai' wife had total hip replacement on 11/21/2021 - he is her total caregiver. He suffered Sinusitis - completed course of antibiotics and prednisone.  2023 Health/Wellness Goals: 1) Lose the recent 18 pounds weight gain.  2) Resume regular exercise.  Subjective:   1. Type 2 diabetes mellitus without complication, with long-term current use of insulin ([place]) On 12/09/2021, A1c 6.7. He is on metformin 1000 mg BID with meals, Levemir 50 units daily- managed by his PCP. He stopped Ozempic 2 mg >2 months ago due to frustration of inability to obtain the Rx. When on max dose Ozempic he tolerated it well.  Of note:  He was on metformin 1000 mg BID, Levemir 50 units daily, Ozempic 2 mg and tolerated well with no hypoglycemia.  Assessment/Plan:   1. Type 2 diabetes mellitus without complication, with long-term current use of insulin ([facility]) Restart Ozempic 0.25 mg once weekly. Monitor BG closely.  - Restart Semaglutide,0.25 or 0.5MG /DOS, (OZEMPIC, 0.25 OR 0.5 MG/DOSE,) 2 MG/1.5ML SOPN; Inject 0.25 mg into the skin once a week.  Dispense: 2 mL; Refill: 0  2. Obesity, current BMI 36.2  Donzell is currently in the action stage of change. As such, his goal is to continue with weight loss efforts. He has agreed to the Category 4 Plan.   Handout:  Category 4 Grocery List.  Exercise goals:  As is.  Behavioral modification  strategies: increasing lean protein intake, decreasing simple carbohydrates, meal planning and cooking strategies, keeping healthy foods in the home, and planning for success.  Dahl has agreed to follow-up with our clinic in 4 weeks. He was informed of the importance of frequent follow-up visits to maximize his success with intensive lifestyle modifications for his multiple health conditions.   Objective:   Blood pressure 139/79, pulse 90, temperature 98.1 F (36.7 C), height 5\' 10"  (1.778 m), weight 252 lb (114.3 kg), SpO2 96 %. Body mass index is 36.16 kg/m.  General: Cooperative, alert, well developed, in no acute distress. HEENT: Conjunctivae and lids unremarkable. Cardiovascular: Regular rhythm.  Lungs: Normal work of breathing. Neurologic: No focal deficits.   Lab Results  Component Value Date   CREATININE 0.90 12/09/2021   BUN 21 12/09/2021   NA 136 12/09/2021   K 4.1 12/09/2021   CL 99 12/09/2021   CO2 28 12/09/2021   Lab Results  Component Value Date   ALT 21 12/09/2021   AST 15 12/09/2021   ALKPHOS 33 (L) 12/09/2021   BILITOT 0.5 12/09/2021   Lab Results  Component Value Date   HGBA1C 6.7 (H) 12/09/2021   HGBA1C 6.0 (H) 09/15/2021   HGBA1C 5.9 (H) 04/21/2021   HGBA1C 5.3 12/02/2020   HGBA1C 5.9 (H) 08/29/2020   Lab Results  Component Value Date   INSULIN 3.2 09/15/2021   INSULIN 5.8 08/29/2020   INSULIN 5.3 05/09/2020   Lab Results  Component Value Date   TSH 1.25 02/05/2020  Lab Results  Component Value Date   CHOL 134 04/21/2021   HDL 46 04/21/2021   LDLCALC 69 04/21/2021   LDLDIRECT 112.0 07/31/2016   TRIG 101 04/21/2021   CHOLHDL 2.9 04/21/2021   Lab Results  Component Value Date   VD25OH 98.8 09/15/2021   VD25OH 42.0 04/21/2021   VD25OH 59.0 01/28/2021   Lab Results  Component Value Date   WBC 9.3 09/30/2021   HGB 12.8 (L) 09/30/2021   HCT 37.8 (L) 09/30/2021   MCV 87.1 09/30/2021   PLT 303 09/30/2021   Obesity Behavioral  Intervention:   Approximately 15 minutes were spent on the discussion below.  ASK: We discussed the diagnosis of obesity with [name] today and Jw agreed to give [name] permission to discuss obesity behavioral modification therapy today.  ASSESS: Rameses has the diagnosis of obesity and his BMI today is 36.2. Jarian is in the action stage of change.   ADVISE: Benson was educated on the multiple health risks of obesity as well as the benefit of weight loss to improve his health. He was advised of the need for long term treatment and the importance of lifestyle modifications to improve his current health and to decrease his risk of future health problems.  AGREE: Multiple dietary modification options and treatment options were discussed and Arlee agreed to follow the recommendations documented in the above note.  ARRANGE: Spence was educated on the importance of frequent visits to treat obesity as outlined per CMS and USPSTF guidelines and agreed to schedule his next follow up appointment today.  Attestation Statements:   Reviewed by clinician on day of visit: allergies, medications, problem list, medical history, surgical history, family history, social history, and previous encounter notes.  I, Water quality scientist, CMA, am acting as Location manager for Mina Marble, NP.  I have reviewed the above documentation for accuracy and completeness, and I agree with the above. -  Tiago Humphrey d. Jakita Dutkiewicz, NP-C

## 2022-01-02 ENCOUNTER — Encounter (INDEPENDENT_AMBULATORY_CARE_PROVIDER_SITE_OTHER): Payer: Self-pay | Admitting: Adult Health

## 2022-01-02 ENCOUNTER — Other Ambulatory Visit (INDEPENDENT_AMBULATORY_CARE_PROVIDER_SITE_OTHER): Payer: Self-pay | Admitting: Adult Health

## 2022-01-02 DIAGNOSIS — F3289 Other specified depressive episodes: Secondary | ICD-10-CM

## 2022-01-05 ENCOUNTER — Other Ambulatory Visit (INDEPENDENT_AMBULATORY_CARE_PROVIDER_SITE_OTHER): Payer: Self-pay | Admitting: Adult Health

## 2022-01-05 ENCOUNTER — Other Ambulatory Visit: Payer: Self-pay

## 2022-01-05 DIAGNOSIS — F3289 Other specified depressive episodes: Secondary | ICD-10-CM

## 2022-01-05 MED ORDER — ESCITALOPRAM OXALATE 20 MG PO TABS: 20.0000 mg | ORAL_TABLET | Freq: Every day | ORAL | 0 refills | Status: DC

## 2022-01-05 NOTE — Telephone Encounter (Signed)
Please see refill request.

## 2022-01-05 NOTE — Telephone Encounter (Signed)
LAST APPOINTMENT DATE: 12/25/21 NEXT APPOINTMENT DATE: 01/22/22   [facility] [place] - [address] [place] [address] Phone: [phone] Fax: [phone]  Patient is requesting a refill of the following medications: Pending Prescriptions:                       Disp   Refills   escitalopram (LEXAPRO) 20 MG tablet        90 tab*0       Sig: Take 1 tablet (20 mg total) by mouth at bedtime.   Date last filled: 11/11/21 #90 Previously prescribed by Mina Marble  Lab Results      Component                Value               Date                      HGBA1C                   6.7 (H)             12/09/2021                HGBA1C                   6.0 (H)             09/15/2021                HGBA1C                   5.9 (H)             04/21/2021           Lab Results      Component                Value               Date                      MICROALBUR               <0.7                05/16/2020                LDLCALC                  69                  04/21/2021                CREATININE               0.90                12/09/2021           Lab Results      Component                Value               Date                      VD25OH                   98.8  09/15/2021                VD25OH                   42.0                04/21/2021                VD25OH                   59.0                01/28/2021            BP Readings from Last 3 Encounters: 12/25/21 : 139/79 12/09/21 : (!) 142/77 11/19/21 : (!) 149/87

## 2022-01-06 ENCOUNTER — Other Ambulatory Visit: Payer: Self-pay

## 2022-01-06 ENCOUNTER — Encounter: Payer: Self-pay | Admitting: Family Medicine

## 2022-01-06 ENCOUNTER — Ambulatory Visit (INDEPENDENT_AMBULATORY_CARE_PROVIDER_SITE_OTHER): Payer: Medicare Other | Admitting: Family Medicine

## 2022-01-06 VITALS — BP 123/75 | HR 85 | Temp 97.5°F | Ht 70.0 in | Wt 257.0 lb

## 2022-01-06 DIAGNOSIS — E119 Type 2 diabetes mellitus without complications: Secondary | ICD-10-CM | POA: Diagnosis not present

## 2022-01-06 DIAGNOSIS — J329 Chronic sinusitis, unspecified: Secondary | ICD-10-CM | POA: Diagnosis not present

## 2022-01-06 DIAGNOSIS — G4709 Other insomnia: Secondary | ICD-10-CM | POA: Diagnosis not present

## 2022-01-06 MED ORDER — LEVEMIR FLEXTOUCH 100 UNIT/ML ~~LOC~~ SOPN: 54.0000 [IU] | PEN_INJECTOR | Freq: Every evening | SUBCUTANEOUS | 6 refills | Status: DC

## 2022-01-06 MED ORDER — MIRTAZAPINE 15 MG PO TABS: 15.0000 mg | ORAL_TABLET | Freq: Every day | ORAL | 6 refills | Status: DC

## 2022-01-06 MED ORDER — CLINDAMYCIN HCL 300 MG PO CAPS: 300.0000 mg | ORAL_CAPSULE | Freq: Three times a day (TID) | ORAL | 0 refills | Status: DC

## 2022-01-06 NOTE — Progress Notes (Signed)
OFFICE VISIT  01/06/2022  CC:  Chief Complaint  Patient presents with   Follow-up    Insomnia, sinuses. Pt still c/o nasal congestion, coughing up very little phlegm, lots of sneezing. Doing sinus rinses, sudafed, Mucinex.     Patient is a 71 y.o. male who presents for 4 wk f/u insomnia and sinuses. A/P as of last visit: "#1 insomnia.  Combination of anxiety/stress and acute sinusitis symptoms preventing use of his CPAP.  Want to avoid benzo or Ambien. Will cautiously start mirtazapine 7.5 mg, 1-2 nightly for sleep.   #2 acute sinusitis, superimposed on some chronic rhinitis issues. He is on a daily nasal steroid over-the-counter and does saline moisturization and irrigation faithfully. Will treat with clindamycin (penicillin allergy) x10 days as well as prednisone 40 mg a day x5 days, then 20 mg a day x5 days.   #3 diabetes type 2 with out complications. Control has been good. A1c today.  He has slipped back into some bad habits from a diet standpoint and is going to try to change this. Continue Levemir 50 units daily and metformin 1000 twice daily.  He is unwilling to continue Ozempic due to cost.  4.  Recurrent major depressive disorder, GAD. No acute depressive episode at this time but his anxiety is bothering him more lately. This is largely to do with the increase in medical issues lately. Continue bupropion SR 150 twice a day and Lexapro 20 mg a day.  As noted in #1 above, cautiously starting mirtazapine for sleep.   #5 HLD: tolerating 80 atorva qd and fenofibrate 135 mg qd. LDL 69, trigs 101 on 04/21/21.  Hepatic panel today.   Next lipid panel check 4 to 6 months.   #6 BPH with lower urinary tract obstructive symptoms.  History of urethral stricture. His urethral stricture has been taken care of by urologist. Ongoing treatment for BPH with tamsulosin, finasteride, and most recently Cialis 5 mg/day was added on.   #7 history of obstructive uropathy.  His bilateral ureteral  stone obstruction was cleared by the urologist.  Follow-up renal ultrasound after procedure showed hydronephrosis had resolved. Will follow-up electrolytes and kidney function today."  INTERIM HX: Hba1c rose to 6.7% last visit but no meds started. Other labs normal.  Last couple of months glucoses have been 150 or 200s, which is particularly poor control for him. He has recently restarted Ozempic at the 0.25 mg dosing--has had 1 injection.  He was able to get this cheaper through [facility].  Taking 50 units of Levemir daily.  Says his sinusitis symptoms did improve on the prednisone and clindamycin but did not completely go away.  Then symptoms returned significantly and he currently endorses nasal congestion postnasal drip of thick mucus, occasional postnasal drip cough.  No fever.  Does feel sinus pressure and occasional sinus headache. Again, this is definitely impairing his ability to use his CPAP, therefore leading to worsening of his chronic fatigue. He has chronic insomnia but has been worse lately, possibly due to increase in his stress but also broken sleep due to chronic nocturia.  ROS as above, plus--> no fevers, no CP, no SOB, no wheezing, no dizziness, no HAs, no rashes, no melena/hematochezia.  +dry mouth.   No focal weakness, paresthesias, or tremors.  No acute vision or hearing abnormalities.   No recent changes in lower legs. No n/v/d or abd pain.  No palpitations.    Past Medical History:  Diagnosis Date   Allergic rhinitis, unspecified  Allergy testing via allergist 04/2017--mostly neg.  Dr. Donneta Romberg recommended referral to ENT so i did this.   Aortic root dilatation ([facility]) 2019   2019, 41 mm, 2022, 39 mm-->rpt 1 yr.   Back pain    LBP->DDD/spondylosis w/ intermittent radiulopathy-type leg pains, + bilat hip and thigh pain-->spinal stenosis L4-5, L5-S1->Dr. Vertell Limber to do decomp and fusion surgery, scheduled for 12/03/20   BPH (benign prostatic hypertrophy)    no hydro on renal  u/s 05/2020   Chronic nasal congestion    DDD (degenerative disc disease), lumbar 2010   laminectomy 01/2009.  MR rpt 05/2019->advanced DDD/spondylosis L3-S1, with mod/sev L4-5 spinal stenosis-->plan for surg per Dr. Vertell Limber as of 09/2020   Depression    Hospitalized for suicidal ideation 1992.  Has been on lexapro since 10/2008.   Diabetes mellitus type II    Dx'd approximately 06/2008.  No retinopathy as of 02/2017 ophth.   Erectile dysfunction    Heart murmur, systolic 08/9322   mild intensity, asymptomatic; echo 01/2018 valves fine->obs   History of colon polyps 01/2016   Recall 3 yrs   History of pneumonia 2008   Hospitalized   History of scarlet fever    age 59   Hyperlipidemia, mixed 1993   myalgias on pravastatin   Hypertension 2009   "marked chronic cardiomegaly" on CXR 01/2009 per old records.   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 Delice Lesch   MRI brain 03/01/20 NORMAL   Nephrolithiasis    calcium oxalate   OSA on CPAP 2004; 2019   Back on CPAP 04/2018.  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 and surgery for this (?) around [place]    Past Surgical History:  Procedure Laterality Date   [place]  X 4   Most recent was 2007 [place], [place]), with removal of polyps in the first two.  02/21/16: tubular adenoma.  Recall 3 yrs (Dr. Ardis Hughs).   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 Vertell Limber)   [place]     age 59   TRANSTHORACIC ECHOCARDIOGRAM  02/22/2018   EF 55-60%, grd I DD. Aortic root dilation 41 mm.  05/2021 echo unchanged except aortic root dilation down to 39 mm. Plan rpt 1 yr to monitor aortic root dilation.   URETHRAL DILATION     2 as a child, one as an adult   VASECTOMY  1994   VITRECTOMY Left     Outpatient Medications Prior to Visit  Medication Sig  Dispense Refill   aspirin 81 MG EC tablet Take 1 tablet (81 mg total) by mouth daily. 30 tablet 12   atorvastatin (LIPITOR) 80 MG tablet Take 1 tablet (80 mg total) by mouth daily. 90 tablet 0   azelastine (ASTELIN) 0.1 % nasal spray Place 2 sprays into both nostrils 2 (two) times daily. Use in each nostril as directed (Patient taking differently: Place 2 sprays into both nostrils daily as needed for rhinitis.) 30 mL 11   buPROPion (WELLBUTRIN SR) 150 MG 12 hr tablet TAKE 1 TABLET(150 MG) BY MOUTH TWICE DAILY 180 tablet 0   Choline Fenofibrate (FENOFIBRIC ACID) 135 MG CPDR Take 1 capsule by mouth daily. 90 capsule 3   escitalopram (LEXAPRO) 20 MG tablet Take 1 tablet (20 mg total) by mouth at bedtime. 90 tablet 0   finasteride (PROSCAR) 5 MG tablet TAKE 1 TABLET(5 MG) BY MOUTH DAILY 90 tablet 3   gabapentin (NEURONTIN) 300 MG capsule Take 900 mg by mouth at bedtime.     glucose blood (ONE TOUCH ULTRA TEST) test strip 1 each by Other route 3 (three) times daily. Test sugars up to three times daily. ( insurance preference  DX E11.65 E11.9) 100 each 11   Insulin Pen Needle (BD PEN NEEDLE NANO 2ND GEN) 32G X 4 MM MISC USE TO INJECT 4 TIMES DAILY 100  each 2   Krill Oil 500 MG CAPS Take 500 mg by mouth at bedtime.     metFORMIN (GLUCOPHAGE) 1000 MG tablet TAKE 1 TABLET BY MOUTH TWICE DAILY WITH A MEAL 180 tablet 1   Semaglutide,0.25 or 0.5MG /DOS, (OZEMPIC, 0.25 OR 0.5 MG/DOSE,) 2 MG/1.5ML SOPN Inject 0.25 mg into the skin once a week. 2 mL 0   sodium chloride (OCEAN) 0.65 % nasal spray Place 1 spray into the nose daily as needed for congestion.     tadalafil (CIALIS) 5 MG tablet Take 1 tablet (5 mg total) by mouth every morning. 30 tablet 11   tamsulosin (FLOMAX) 0.4 MG CAPS capsule Take 1 capsule (0.4 mg total) by mouth at bedtime. 90 capsule 3   vitamin B-12 (CYANOCOBALAMIN) 1000 MCG tablet Take 2,000 mcg by mouth at bedtime.     insulin detemir (LEVEMIR FLEXTOUCH) 100 UNIT/ML FlexPen Inject 50 Units into the skin every evening. 15 mL 6   No facility-administered medications prior to visit.    Allergies  Allergen Reactions   Simvastatin Other (See Comments)    myalgias   Antihistamines, Diphenhydramine-Type Other (See Comments)    depression   Codeine Nausea Only   Penicillins Hives   Sulfa Antibiotics Hives    ROS As per HPI  PE: Vitals with BMI 01/06/2022 12/25/2021 12/09/2021  Height 5\' 10"  5\' 10"  5\' 10"   Weight 257 lbs 252 lbs 249 lbs  BMI 36.88 83.38 25.05  Systolic 397 673 419  Diastolic 75 79 77  Pulse 85 90 81     Physical Exam  Gen: Alert, well appearing.  Patient is oriented to person, place, time, and situation. AFFECT: pleasant, lucid thought and speech. No further exam today.  LABS:  Last CBC Lab Results  Component Value Date   WBC 9.3 09/30/2021   HGB 12.8 (L) 09/30/2021   HCT 37.8 (L) 09/30/2021   MCV 87.1 09/30/2021   MCH 29.5 09/30/2021   RDW 13.1 09/30/2021   PLT 303 37/90/2409   Last metabolic panel Lab Results  Component Value Date   GLUCOSE 98 12/09/2021   NA 136 12/09/2021   K  4.1 12/09/2021   CL 99 12/09/2021   CO2 28 12/09/2021   BUN 21 12/09/2021   CREATININE 0.90 12/09/2021    GFRNONAA 52 (L) 09/30/2021   CALCIUM 9.9 12/09/2021   PROT 6.9 12/09/2021   ALBUMIN 4.4 12/09/2021   LABGLOB 2.3 09/15/2021   AGRATIO 2.0 09/15/2021   BILITOT 0.5 12/09/2021   ALKPHOS 33 (L) 12/09/2021   AST 15 12/09/2021   ALT 21 12/09/2021   ANIONGAP 8 09/30/2021   Last lipids Lab Results  Component Value Date   CHOL 134 04/21/2021   HDL 46 04/21/2021   LDLCALC 69 04/21/2021   LDLDIRECT 112.0 07/31/2016   TRIG 101 04/21/2021   CHOLHDL 2.9 04/21/2021   Last hemoglobin A1c Lab Results  Component Value Date   HGBA1C 6.7 (H) 12/09/2021   Last thyroid functions Lab Results  Component Value Date   TSH 1.25 02/05/2020   T3TOTAL 108 02/14/2018   IMPRESSION AND PLAN:  #1 chronic/recurrent sinusitis. Hesitant to give him prednisone anymore due to his poor diabetes control lately. We will refer to ENT.  In the interim will do 2 more weeks of clindamycin. Continue nasal rinses.  2.  Insomnia.  Has had a little improvement with taking 2 of the 7.5 mg mirtazapine nightly. We will continue this 15 mg dose for now.  He wishes to avoid any controlled substances for sleep. Urology is working with him on his BPH/obstructive symptoms.  #3 diabetes type 2 historically well controlled. A1c has been climbing over the last year, most recent was 6.7% about 1 month ago. Just started Ozempic. I recommended he increase his Levemir from 50 to 54 units daily. Hemoglobin A1c and urine microalbumin/creatinine to be done after 03/09/2022.  An After Visit Summary was printed and given to the patient.  FOLLOW UP: No follow-ups on file.  Signed:  Crissie Sickles, MD           01/06/2022

## 2022-01-06 NOTE — Patient Instructions (Signed)
Increase your levemir to 54 Units SQ once a day

## 2022-01-16 ENCOUNTER — Encounter: Payer: Self-pay | Admitting: Family Medicine

## 2022-01-16 DIAGNOSIS — E1169 Type 2 diabetes mellitus with other specified complication: Secondary | ICD-10-CM

## 2022-01-16 DIAGNOSIS — Z794 Long term (current) use of insulin: Secondary | ICD-10-CM

## 2022-01-19 MED ORDER — BD PEN NEEDLE NANO 2ND GEN 32G X 4 MM MISC: 5 refills | Status: DC

## 2022-01-22 ENCOUNTER — Other Ambulatory Visit: Payer: Self-pay

## 2022-01-22 ENCOUNTER — Encounter (INDEPENDENT_AMBULATORY_CARE_PROVIDER_SITE_OTHER): Payer: Self-pay | Admitting: Adult Health

## 2022-01-22 ENCOUNTER — Ambulatory Visit (INDEPENDENT_AMBULATORY_CARE_PROVIDER_SITE_OTHER): Payer: Medicare Other | Admitting: Adult Health

## 2022-01-22 VITALS — BP 151/65 | HR 108 | Temp 98.1°F | Ht 70.0 in | Wt 251.0 lb

## 2022-01-22 DIAGNOSIS — E669 Obesity, unspecified: Secondary | ICD-10-CM | POA: Diagnosis not present

## 2022-01-22 DIAGNOSIS — E1169 Type 2 diabetes mellitus with other specified complication: Secondary | ICD-10-CM | POA: Diagnosis not present

## 2022-01-22 DIAGNOSIS — Z794 Long term (current) use of insulin: Secondary | ICD-10-CM | POA: Diagnosis not present

## 2022-01-22 DIAGNOSIS — Z6836 Body mass index (BMI) 36.0-36.9, adult: Secondary | ICD-10-CM

## 2022-01-22 MED ORDER — OZEMPIC (0.25 OR 0.5 MG/DOSE) 2 MG/1.5ML ~~LOC~~ SOPN
0.5000 mg | PEN_INJECTOR | SUBCUTANEOUS | 0 refills | Status: DC
Start: 2022-01-22 — End: 2022-02-24

## 2022-01-22 NOTE — Progress Notes (Signed)
Chief Complaint:   OBESITY [name] [name] is here to discuss his progress with his obesity treatment plan along with follow-up of his obesity related diagnoses. [name] [name] is on the Category 4 Plan and states he is following his eating plan approximately 55% of the time. [name] [name] states he is doing water aerobics 60 minutes 3 times per week.  Today's visit was #: 81 Starting weight: 273 lbs Starting date: 02/14/2018 Today's weight: 251 lbs Today's date: 01/22/2022 Total lbs lost to date: 22 Total lbs lost since last in-office visit: 1  Interim History:  When patient eats off plan - he chooses, bread, oranges, chocolate. Patient estimates to consume 900 calories of snacks/day. 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 His dogs have required emergency veterinary services. He needs significant dental work. His wife is recovering well from recent joint replacement surgery- great!  Subjective:   1. Type 2 diabetes mellitus with other specified complication, with long-term current use of insulin (HCC) Ambulatory fatigue blood glucose readings- 110's without hypoglycemia. He was started on Ozempic 0.25mg -12/25/2021, 4 doses at this strength- tolerating well. He is also currently on Metformin 100mg  twice a day, Levemir 54 units, these are managed by his PCP.  On 01/06/22- BG running 150-200 at that time He was on Metformin 1000mg  BID and Ozempic 0.25mg  once week PCP increased Levemir from 50 units to 54 units.   Assessment/Plan:   1. Type 2 diabetes mellitus with other specified complication, with long-term current use of insulin (HCC) Refill and increase Ozempic 0.5mg  once weekly. Disp:1ml,  Refills:0 Continue on Metformin 1000mg  BID and decrease Levemir from 54 U to 50 U QD Mychart message sent to PCP about reduction of Levemir.  2. Obesity, current BMI 36.0  [name] [name] is currently in the action stage of change. As such, his goal is to continue  with weight loss efforts. He has agreed to the Category 4 Plan.   Exercise goals:  As is.  Behavioral modification strategies: increasing lean protein intake, decreasing simple carbohydrates, meal planning and cooking strategies, keeping healthy foods in the home, and planning for success.  [name] [name] has agreed to follow-up with our clinic in 4 weeks. He was informed of the importance of frequent follow-up visits to maximize his success with intensive lifestyle modifications for his multiple health conditions.   Objective:   Blood pressure (!) 151/65, pulse (!) 108, temperature 98.1 F (36.7 C), height 5\' 10"  (1.778 m), weight 251 lb (113.9 kg), SpO2 98 %. Body mass index is 36.01 kg/m.  General: Cooperative, alert, well developed, in no acute distress. HEENT: Conjunctivae and lids unremarkable. Cardiovascular: Regular rhythm.  Lungs: Normal work of breathing. Neurologic: No focal deficits.   Lab Results  Component Value Date   CREATININE 0.90 12/09/2021   BUN 21 12/09/2021   NA 136 12/09/2021   K 4.1 12/09/2021   CL 99 12/09/2021   CO2 28 12/09/2021   Lab Results  Component Value Date   ALT 21 12/09/2021   AST 15 12/09/2021   ALKPHOS 33 (L) 12/09/2021   BILITOT 0.5 12/09/2021   Lab Results  Component Value Date   HGBA1C 6.7 (H) 12/09/2021   HGBA1C 6.0 (H) 09/15/2021   HGBA1C 5.9 (H) 04/21/2021   HGBA1C 5.3 12/02/2020   HGBA1C 5.9 (H) 08/29/2020   Lab Results  Component Value Date   INSULIN 3.2 09/15/2021   INSULIN 5.8 08/29/2020   INSULIN 5.3 05/09/2020   Lab Results  Component Value Date   TSH 1.25 02/05/2020   Lab Results  Component Value Date   CHOL 134 04/21/2021   HDL 46 04/21/2021   LDLCALC 69 04/21/2021   LDLDIRECT 112.0 07/31/2016   TRIG 101 04/21/2021   CHOLHDL 2.9 04/21/2021   Lab Results  Component Value Date   VD25OH 98.8 09/15/2021   VD25OH 42.0 04/21/2021   VD25OH 59.0 01/28/2021   Lab Results  Component Value Date   WBC 9.3  09/30/2021   HGB 12.8 (L) 09/30/2021   HCT 37.8 (L) 09/30/2021   MCV 87.1 09/30/2021   PLT 303 09/30/2021   Attestation Statements:   Reviewed by clinician on day of visit: allergies, medications, problem list, medical history, surgical history, family history, social history, and previous encounter notes.  I, Brendell Tyus, RMA, am acting as transcriptionist for Mina Marble, NP.  I have reviewed the above documentation for accuracy and completeness, and I agree with the above. -  Elizer Bostic d. Totiana Everson, NP-C

## 2022-01-26 DIAGNOSIS — Z20822 Contact with and (suspected) exposure to covid-19: Secondary | ICD-10-CM | POA: Diagnosis not present

## 2022-01-27 ENCOUNTER — Other Ambulatory Visit: Payer: Self-pay

## 2022-01-27 MED ORDER — LEVEMIR FLEXTOUCH 100 UNIT/ML ~~LOC~~ SOPN
54.0000 [IU] | PEN_INJECTOR | Freq: Every evening | SUBCUTANEOUS | 6 refills | Status: DC
Start: 2022-01-27 — End: 2022-09-09

## 2022-01-28 DIAGNOSIS — Z20822 Contact with and (suspected) exposure to covid-19: Secondary | ICD-10-CM | POA: Diagnosis not present

## 2022-02-01 ENCOUNTER — Other Ambulatory Visit: Payer: Self-pay | Admitting: Family Medicine

## 2022-02-05 ENCOUNTER — Other Ambulatory Visit: Payer: Self-pay

## 2022-02-05 ENCOUNTER — Encounter: Payer: Self-pay | Admitting: Family Medicine

## 2022-02-05 MED ORDER — ATORVASTATIN CALCIUM 80 MG PO TABS: 80.0000 mg | ORAL_TABLET | Freq: Every day | ORAL | 0 refills | Status: DC

## 2022-02-12 ENCOUNTER — Other Ambulatory Visit: Payer: Self-pay

## 2022-02-12 ENCOUNTER — Encounter: Payer: Self-pay | Admitting: Adult Health

## 2022-02-12 ENCOUNTER — Ambulatory Visit (INDEPENDENT_AMBULATORY_CARE_PROVIDER_SITE_OTHER): Payer: Medicare Other | Admitting: Adult Health

## 2022-02-12 VITALS — BP 134/75 | HR 87 | Ht 70.0 in | Wt 258.0 lb

## 2022-02-12 DIAGNOSIS — Z9989 Dependence on other enabling machines and devices: Secondary | ICD-10-CM | POA: Diagnosis not present

## 2022-02-12 DIAGNOSIS — G4733 Obstructive sleep apnea (adult) (pediatric): Secondary | ICD-10-CM | POA: Diagnosis not present

## 2022-02-12 NOTE — Progress Notes (Signed)
? ? ?PATIENT: [name] [name] ?DOB: 17-Dec-1950 ? ?REASON FOR VISIT: follow up ?HISTORY FROM: patient ?PRIMARY NEUROLOGIST: Dr. Rexene Alberts ? ?HISTORY OF PRESENT ILLNESS: ?Today 02/12/22: ? ?[name] is a 71 year old male with a history of obstructive sleep apnea on CPAP.  His download is below.  His residual AHI has elevated since his last visit a year ago.  His weight has increased about 20 pounds.  He reports that most nights he wakes up around 4 AM and takes it off.  Reports that he should is giving himself a break however he does not put it back on. ? ? ? ? ? ?REVIEW OF SYSTEMS: Out of a complete 14 system review of symptoms, the patient complains only of the following symptoms, and all other reviewed systems are negative. ?  ?ESS 8 ? ?ALLERGIES: ?Allergies  ?Allergen Reactions  ? Simvastatin Other (See Comments)  ?  myalgias  ? Antihistamines, Diphenhydramine-Type Other (See Comments)  ?  depression  ? Codeine Nausea Only  ? Penicillins Hives  ? Sulfa Antibiotics Hives  ? ? ?HOME MEDICATIONS: ?Outpatient Medications Prior to Visit  ?Medication Sig Dispense Refill  ? aspirin 81 MG EC tablet Take 1 tablet (81 mg total) by mouth daily. 30 tablet 12  ? atorvastatin (LIPITOR) 80 MG tablet Take 1 tablet (80 mg total) by mouth daily. 90 tablet 0  ? azelastine (ASTELIN) 0.1 % nasal spray Place 2 sprays into both nostrils 2 (two) times daily. Use in each nostril as directed (Patient taking differently: Place 2 sprays into both nostrils daily as needed for rhinitis.) 30 mL 11  ? buPROPion (WELLBUTRIN SR) 150 MG 12 hr tablet TAKE 1 TABLET(150 MG) BY MOUTH TWICE DAILY 180 tablet 0  ? Choline Fenofibrate (FENOFIBRIC ACID) 135 MG CPDR Take 1 capsule by mouth daily. 90 capsule 3  ? escitalopram (LEXAPRO) 20 MG tablet Take 1 tablet (20 mg total) by mouth at bedtime. 90 tablet 0  ? finasteride (PROSCAR) 5 MG tablet TAKE 1 TABLET(5 MG) BY MOUTH DAILY 90 tablet 3  ? gabapentin (NEURONTIN) 300 MG capsule Take 900 mg by mouth  at bedtime.    ? glucose blood (ONE TOUCH ULTRA TEST) test strip 1 each by Other route 3 (three) times daily. Test sugars up to three times daily. ( insurance preference  DX E11.65 E11.9) 100 each 11  ? insulin detemir (LEVEMIR FLEXTOUCH) 100 UNIT/ML FlexPen Inject 54 Units into the skin every evening. (Patient taking differently: Inject 50 Units into the skin every evening.) 15 mL 6  ? Insulin Pen Needle (BD PEN NEEDLE NANO 2ND GEN) 32G X 4 MM MISC USE TO INJECT 4 TIMES DAILY 200 each 5  ? Krill Oil 500 MG CAPS Take 500 mg by mouth at bedtime.    ? metFORMIN (GLUCOPHAGE) 1000 MG tablet TAKE 1 TABLET BY MOUTH TWICE DAILY WITH A MEAL 180 tablet 1  ? mirtazapine (REMERON) 15 MG tablet Take 1 tablet (15 mg total) by mouth at bedtime. 30 tablet 6  ? Semaglutide,0.25 or 0.'5MG'$ /DOS, (OZEMPIC, 0.25 OR 0.5 MG/DOSE,) 2 MG/1.5ML SOPN Inject 0.5 mg into the skin once a week. 2 mL 0  ? sodium chloride (OCEAN) 0.65 % nasal spray Place 1 spray into the nose daily as needed for congestion.    ? tadalafil (CIALIS) 5 MG tablet Take 1 tablet (5 mg total) by mouth every morning. 30 tablet 11  ? tamsulosin (FLOMAX) 0.4 MG CAPS capsule Take 1 capsule (0.4 mg total) by mouth at  bedtime. 90 capsule 3  ? vitamin B-12 (CYANOCOBALAMIN) 1000 MCG tablet Take 2,000 mcg by mouth at bedtime.    ? ?No facility-administered medications prior to visit.  ? ? ?PAST MEDICAL HISTORY: ?Past Medical History:  ?Diagnosis Date  ? Allergic rhinitis, unspecified   ? Allergy testing via allergist 04/2017--mostly neg.  Dr. Donneta Romberg recommended referral to ENT so i did this.  ? Aortic root dilatation ([facility]) 2019  ? 2019, 41 mm, 2022, 39 mm-->rpt 1 yr.  ? Back pain   ? LBP->DDD/spondylosis w/ intermittent radiulopathy-type leg pains, + bilat hip and thigh pain-->spinal stenosis L4-5, L5-S1->Dr. Vertell Limber to do decomp and fusion surgery, scheduled for 12/03/20  ? BPH (benign prostatic hypertrophy)   ? no hydro on renal u/s 05/2020  ? Chronic nasal congestion   ? DDD  (degenerative disc disease), lumbar 2010  ? laminectomy 01/2009.  MR rpt 05/2019->advanced DDD/spondylosis L3-S1, with mod/sev L4-5 spinal stenosis-->plan for surg per Dr. Vertell Limber as of 09/2020  ? Depression   ? Hospitalized for suicidal ideation 1992.  Has been on lexapro since 10/2008.  ? Diabetes mellitus type II   ? Dx'd approximately 06/2008.  No retinopathy as of 02/2017 ophth.  ? Erectile dysfunction   ? Heart murmur, systolic 83/6629  ? mild intensity, asymptomatic; echo 01/2018 valves fine->obs  ? History of colon polyps 01/2016  ? Recall 3 yrs  ? History of pneumonia 2008  ? Hospitalized  ? History of scarlet fever   ? age 20  ? Hyperlipidemia, mixed 1993  ? myalgias on pravastatin  ? Hypertension 2009  ? "marked chronic cardiomegaly" on CXR 01/2009 per old records.  ? Hypogonadism, male 01/11/2012  ? Axiron trial started 03/2012  ? Insomnia   ? 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 Kidney stones   ? Memory changes   ? Mild cognitive impairment with memory loss 01/2020 eval with Dr. Delice Lesch  ? MRI brain 03/01/20 NORMAL  ? Nephrolithiasis   ? calcium oxalate  ? OSA on CPAP 2004; 2019  ? Back on CPAP 04/2018.  Compliance great as of 01/2020 neuro/sleep f/u  ? Osteoarthritis of both knees   ? Mild plain film changes in medial compartment bilat  ? Restless leg   ? gabapentin helpful  ? Rhinitis, chronic   ? Urethral stricture   ? dilated X 2 as a child and surgery for this (?) around 1990  ? ? ?PAST SURGICAL HISTORY: ?Past Surgical History:  ?Procedure Laterality Date  ? APPENDECTOMY  1972  ? COLONOSCOPY  X 4  ? Most recent was 2007 [place], [place]), with removal of polyps in the first two.  02/21/16: tubular adenoma.  Recall 3 yrs (Dr. Ardis Hughs).  ? 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 CYSTOSCOPY WITH RETROGRADE PYELOGRAM, URETEROSCOPY AND STENT PLACEMENT Bilateral 09/29/2021  ? Procedure: CYSTOSCOPY WITH RETROGRADE PYELOGRAM, URETEROSCOPY AND STENT PLACEMENT;  Surgeon: Cleon Gustin, MD;  Location: [facility];  Service: Urology;   Laterality: Bilateral;  ? ELECTROCARDIOGRAM  01/29/2009  ? NORMAL  ? HOLMIUM LASER APPLICATION Bilateral 47/65/4650  ? Procedure: HOLMIUM LASER APPLICATION;  Surgeon: Cleon Gustin, MD;  Location: [facility];  Service: Urology;  Laterality: Bilateral;  ? LUMBAR LAMINECTOMY  01/2009  ? POSTERIOR LUMBAR FUSION  12/03/2020  ? L4-5, L5-S1 (Dr. Vertell Limber)  ? TONSILLECTOMY AND ADENOIDECTOMY    ? age 34  ? TRANSTHORACIC ECHOCARDIOGRAM  02/22/2018  ? EF 55-60%, grd I DD. Aortic root dilation 41 mm.  05/2021 echo unchanged except aortic root dilation down to 39 mm. Plan rpt 1 yr to monitor aortic root dilation.  ? URETHRAL DILATION    ? 2 as a child, one as an adult  ? VASECTOMY  1994  ? VITRECTOMY Left   ? ? ?FAMILY HISTORY: ?Family History  ?Problem Relation Age of Onset  ? Heart disease Mother   ? Hyperlipidemia Mother   ? Stroke Mother   ? Diabetes Mother   ? Depression Mother   ? Alzheimer's disease Mother   ? Stroke Father   ? Hyperlipidemia Father   ? Heart disease Father   ? Diabetes Father   ? Hypertension Father   ? Obesity Father   ? Alcohol abuse Sister   ? Depression Sister   ? Sleep apnea Neg Hx   ? ? ?SOCIAL HISTORY: ?Social History  ? ?Socioeconomic History  ? Marital status: Married  ?  Spouse name: Rainn Bullinger  ? Number of children: Not on file  ? Years of education: Not on file  ? Highest education level: Professional school degree (e.g., MD, DDS, DVM, JD)  ?Occupational History  ? Occupation: Retired  ?Tobacco Use  ? Smoking status: Former  ?  Types: Cigars  ?  Quit date: 09/04/1981  ?  Years since quitting: 40.4  ? Smokeless tobacco: Never  ?Vaping Use  ? Vaping Use: Never used  ?Substance and Sexual Activity  ? Alcohol use: Yes  ?  Comment: social, 3-4 drinks month  ? Drug use: No  ? Sexual activity: Yes  ?  Partners: Female  ?Other Topics Concern  ? Not on file  ?Social History Narrative  ? Divorced, then remarried.  Has 3 sons, no grandchildren.  ? Retired Engineer, production from [place],  relocated to [facility] 2012 when his wife got a job with [employer].  ? No tobacco.  Rare ETOH.  No drug abuse.  ? Enjoys reading and spending time with his 2 dogs.  ? Right handed   ? ?Social Determinants of Health  ? ?Fi

## 2022-02-16 NOTE — Progress Notes (Signed)
Denyse Amass, RN; Vanessa Ralphs ?got it   ? ?  ?Previous Messages ?  ?----- Message -----  ?From: Brandon Melnick, RN  ?Sent: 02/16/2022   7:23 AM EDT  ?To: Ocie Bob  ?Subject: change to autoset                              ? ?Good Morning,  New order in Epic for pt.  [name] [name] "Tom"  ?Male, 71 y.o., 1951/11/12  ?MRN:  ?[id number] (change to autoset 9-18 cmh20 epr2)  ? ?Thank you.    ? ?Sandy RN  ? ?

## 2022-02-23 ENCOUNTER — Other Ambulatory Visit (HOSPITAL_COMMUNITY): Payer: Self-pay | Admitting: Otolaryngology

## 2022-02-23 DIAGNOSIS — J342 Deviated nasal septum: Secondary | ICD-10-CM | POA: Diagnosis not present

## 2022-02-23 DIAGNOSIS — J310 Chronic rhinitis: Secondary | ICD-10-CM | POA: Diagnosis not present

## 2022-02-23 DIAGNOSIS — J329 Chronic sinusitis, unspecified: Secondary | ICD-10-CM

## 2022-02-23 DIAGNOSIS — J343 Hypertrophy of nasal turbinates: Secondary | ICD-10-CM | POA: Diagnosis not present

## 2022-02-23 DIAGNOSIS — J324 Chronic pansinusitis: Secondary | ICD-10-CM | POA: Diagnosis not present

## 2022-02-24 ENCOUNTER — Encounter (INDEPENDENT_AMBULATORY_CARE_PROVIDER_SITE_OTHER): Payer: Self-pay | Admitting: Adult Health

## 2022-02-24 ENCOUNTER — Other Ambulatory Visit: Payer: Self-pay

## 2022-02-24 ENCOUNTER — Ambulatory Visit (INDEPENDENT_AMBULATORY_CARE_PROVIDER_SITE_OTHER): Payer: Medicare Other | Admitting: Adult Health

## 2022-02-24 VITALS — BP 119/70 | HR 77 | Temp 97.8°F | Ht 70.0 in | Wt 248.0 lb

## 2022-02-24 DIAGNOSIS — E669 Obesity, unspecified: Secondary | ICD-10-CM | POA: Diagnosis not present

## 2022-02-24 DIAGNOSIS — Z7985 Long-term (current) use of injectable non-insulin antidiabetic drugs: Secondary | ICD-10-CM

## 2022-02-24 DIAGNOSIS — E1169 Type 2 diabetes mellitus with other specified complication: Secondary | ICD-10-CM

## 2022-02-24 DIAGNOSIS — F3289 Other specified depressive episodes: Secondary | ICD-10-CM | POA: Diagnosis not present

## 2022-02-24 DIAGNOSIS — Z6835 Body mass index (BMI) 35.0-35.9, adult: Secondary | ICD-10-CM

## 2022-02-24 MED ORDER — BUPROPION HCL ER (SR) 150 MG PO TB12: ORAL_TABLET | ORAL | 0 refills | Status: DC

## 2022-02-24 MED ORDER — SEMAGLUTIDE (1 MG/DOSE) 4 MG/3ML ~~LOC~~ SOPN: 1.0000 mg | PEN_INJECTOR | SUBCUTANEOUS | 0 refills | Status: DC

## 2022-02-25 NOTE — Progress Notes (Signed)
? ? ? ?Chief Complaint:  ? ?OBESITY ?[name] [name] [name] here to discuss his progress with his obesity treatment plan along with follow-up of his obesity related diagnoses. [name] [name] [name] on the Category 4 Plan and states he [name] following his eating plan approximately 70% of the time. [name] [name] states he [name] doing water aerobics for 60 minutes 3 times per week. ? ?Today's visit was #: 35 ?Starting weight: 273 lbs ?Starting date: 02/14/2018 ?Today's weight: 248 lbs ?Today's date: 02/24/2022 ?Total lbs lost to date: 25 lbs ?Total lbs lost since last in-office visit: 3 lbs ? ?Interim History:  ?[name] [name] endorses an increase in appetite in between meals with subsequent increased snacking,  ?i.e., banana, sugar-free popsicles, oranges, crackers. ? ?He continues to enjoy water aerobics 3 times weekly for at least an hour- great! ? ?Subjective:  ? ?1. Type 2 diabetes mellitus with other specified complication, with long-term current use of insulin ([name] [name]) ?On 01/22/2022, increased Ozempic to 0.5 mg and decreased Levemir 50 units at last office visit. ?Remained on metformin 1000 mg BID. ?Ambulatory fasting 80-110s.  No symptoms of hypotension. ?He denies mass in neck, dysphagia, dyspepsia, persistent hoarseness, abd pain, or N/V/Constipation. ? ?2. Other depression, with emotional eating  ?He reports stable mood.  Denies SI/HI. ?He denies history of seizure. ?He [name] on Bupropion SR '150mg'$  BID- tolerating well. ? ?Assessment/Plan:  ? ?1. Type 2 diabetes mellitus with other specified complication, with long-term current use of insulin ([name] [name]) ?Remain on metformin 1000 mg BID with meals. ?Refill Ozempic 1 mg once weekly. ?Decrease Levemir to 42 units. ?MyChart message PCP with change in antidiabetic regime. ?Closely monitor blood glucose and notify clinic with if any <70 or consistently >200. ?Pt verbalized understanding and agreement. ? ?- Refill Semaglutide, 1 MG/DOSE, 4 MG/3ML SOPN; Inject 1 mg as directed once a week.  Dispense: 3 mL; Refill: 0 ? ?2.  Other depression, with emotional eating  ? ?RF- buPROPion (WELLBUTRIN SR) 150 MG 12 hr tablet; TAKE 1 TABLET(150 MG) BY MOUTH TWICE DAILY  Dispense: 180 tablet; Refill: 0 ? ?3. Obesity, current BMI 35.6 ? ?[name] [name] currently in the action stage of change. As such, his goal [name] to continue with weight loss efforts. He has agreed to the Category 4 Plan.  ? ?Track snack calories and keep <300 snack calories/day. ? ?Handout - 200 Snack Calories / 100 Snack Calories. ? ?Exercise goals:  As [name]. ? ?Behavioral modification strategies: increasing lean protein intake, decreasing simple carbohydrates, meal planning and cooking strategies, keeping healthy foods in the home, better snacking choices, and planning for success. ? ?[name] [name] has agreed to follow-up with our clinic in 5 weeks. He was informed of the importance of frequent follow-up visits to maximize his success with intensive lifestyle modifications for his multiple health conditions.  ? ?Objective:  ? ?Blood pressure 119/70, pulse 77, temperature 97.8 ?F (36.6 ?C), height '5\' 10"'$  (1.778 m), weight 248 lb (112.5 kg), SpO2 96 %. ?Body mass index [name] 35.58 kg/m?. ? ?General: Cooperative, alert, well developed, in no acute distress. ?HEENT: Conjunctivae and lids unremarkable. ?Cardiovascular: Regular rhythm.  ?Lungs: Normal work of breathing. ?Neurologic: No focal deficits.  ? ?Lab Results  ?Component Value Date  ? CREATININE 0.90 12/09/2021  ? BUN 21 12/09/2021  ? NA 136 12/09/2021  ? K 4.1 12/09/2021  ? CL 99 12/09/2021  ? CO2 28 12/09/2021  ? ?Lab Results  ?Component Value Date  ? ALT 21 12/09/2021  ? AST 15 12/09/2021  ? ALKPHOS 33 (L)  12/09/2021  ? BILITOT 0.5 12/09/2021  ? ?Lab Results  ?Component Value Date  ? HGBA1C 6.7 (H) 12/09/2021  ? HGBA1C 6.0 (H) 09/15/2021  ? HGBA1C 5.9 (H) 04/21/2021  ? HGBA1C 5.3 12/02/2020  ? HGBA1C 5.9 (H) 08/29/2020  ? ?Lab Results  ?Component Value Date  ? INSULIN 3.2 09/15/2021  ? INSULIN 5.8 08/29/2020  ? INSULIN 5.3 05/09/2020   ? ?Lab Results  ?Component Value Date  ? TSH 1.25 02/05/2020  ? ?Lab Results  ?Component Value Date  ? CHOL 134 04/21/2021  ? HDL 46 04/21/2021  ? [place] 69 04/21/2021  ? LDLDIRECT 112.0 07/31/2016  ? TRIG 101 04/21/2021  ? CHOLHDL 2.9 04/21/2021  ? ?Lab Results  ?Component Value Date  ? VD25OH 98.8 09/15/2021  ? VD25OH 42.0 04/21/2021  ? VD25OH 59.0 01/28/2021  ? ?Lab Results  ?Component Value Date  ? WBC 9.3 09/30/2021  ? HGB 12.8 (L) 09/30/2021  ? HCT 37.8 (L) 09/30/2021  ? MCV 87.1 09/30/2021  ? PLT 303 09/30/2021  ? ?Obesity Behavioral Intervention:  ? ?Approximately 15 minutes were spent on the discussion below. ? ?ASK: ?We discussed the diagnosis of obesity with [name] [name] today and [name] [name] agreed to give [name] permission to discuss obesity behavioral modification therapy today. ? ?ASSESS: ?[name] [name] has the diagnosis of obesity and his BMI today [name] 35.6. [name] [name] [name] in the action stage of change.  ? ?ADVISE: ?[name] [name] was educated on the multiple health risks of obesity as well as the benefit of weight loss to improve his health. He was advised of the need for long term treatment and the importance of lifestyle modifications to improve his current health and to decrease his risk of future health problems. ? ?AGREE: ?Multiple dietary modification options and treatment options were discussed and [name] [name] agreed to follow the recommendations documented in the above note. ? ?ARRANGE: ?[name] [name] was educated on the importance of frequent visits to treat obesity as outlined per CMS and USPSTF guidelines and agreed to schedule his next follow up appointment today. ? ?Attestation Statements:  ? ?Reviewed by clinician on day of visit: allergies, medications, problem list, medical history, surgical history, family history, social history, and previous encounter notes. ? ?I, Water quality scientist, CMA, am acting as Location manager for Mina Marble, NP. ? ?I have reviewed the above documentation for accuracy and completeness, and I agree with the  above. -  Stpehen Petitjean d. Judea Riches, NP-C ?

## 2022-02-27 DIAGNOSIS — Z20822 Contact with and (suspected) exposure to covid-19: Secondary | ICD-10-CM | POA: Diagnosis not present

## 2022-03-03 ENCOUNTER — Encounter: Payer: Self-pay | Admitting: Family Medicine

## 2022-03-03 ENCOUNTER — Ambulatory Visit (INDEPENDENT_AMBULATORY_CARE_PROVIDER_SITE_OTHER): Payer: Medicare Other | Admitting: Family Medicine

## 2022-03-03 VITALS — BP 136/71 | HR 81 | Temp 97.7°F | Ht 70.0 in | Wt 253.0 lb

## 2022-03-03 DIAGNOSIS — F3342 Major depressive disorder, recurrent, in full remission: Secondary | ICD-10-CM | POA: Diagnosis not present

## 2022-03-03 DIAGNOSIS — G4709 Other insomnia: Secondary | ICD-10-CM | POA: Diagnosis not present

## 2022-03-03 DIAGNOSIS — F3289 Other specified depressive episodes: Secondary | ICD-10-CM | POA: Diagnosis not present

## 2022-03-03 DIAGNOSIS — S8392XA Sprain of unspecified site of left knee, initial encounter: Secondary | ICD-10-CM | POA: Diagnosis not present

## 2022-03-03 DIAGNOSIS — G3184 Mild cognitive impairment, so stated: Secondary | ICD-10-CM | POA: Diagnosis not present

## 2022-03-03 DIAGNOSIS — E7800 Pure hypercholesterolemia, unspecified: Secondary | ICD-10-CM

## 2022-03-03 DIAGNOSIS — E119 Type 2 diabetes mellitus without complications: Secondary | ICD-10-CM | POA: Diagnosis not present

## 2022-03-03 LAB — BASIC METABOLIC PANEL
BUN: 28 mg/dL — ABNORMAL HIGH (ref 6–23)
CO2: 27 mEq/L (ref 19–32)
Calcium: 10.3 mg/dL (ref 8.4–10.5)
Chloride: 103 mEq/L (ref 96–112)
Creatinine, Ser: 1.05 mg/dL (ref 0.40–1.50)
GFR: 72.58 mL/min (ref >60.00)
Glucose, Bld: 88 mg/dL (ref 70–99)
Potassium: 4 mEq/L (ref 3.5–5.1)
Sodium: 139 mEq/L (ref 135–145)

## 2022-03-03 LAB — HEMOGLOBIN A1C: Hgb A1c MFr Bld: 6.8 % — ABNORMAL HIGH (ref 4.6–6.5)

## 2022-03-03 MED ORDER — ESCITALOPRAM OXALATE 20 MG PO TABS: 20.0000 mg | ORAL_TABLET | Freq: Every day | ORAL | 1 refills | Status: DC
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
## 2022-03-03 NOTE — Patient Instructions (Signed)
Call Dr. Amparo Bristol office to set up follow up appt for memory issues:  [phone] ?

## 2022-03-05 DIAGNOSIS — Z20822 Contact with and (suspected) exposure to covid-19: Secondary | ICD-10-CM | POA: Diagnosis not present

## 2022-03-06 DIAGNOSIS — Z20822 Contact with and (suspected) exposure to covid-19: Secondary | ICD-10-CM | POA: Diagnosis not present

## 2022-03-09 ENCOUNTER — Telehealth: Payer: Self-pay

## 2022-03-09 DIAGNOSIS — Z20822 Contact with and (suspected) exposure to covid-19: Secondary | ICD-10-CM | POA: Diagnosis not present

## 2022-03-09 DIAGNOSIS — R051 Acute cough: Secondary | ICD-10-CM | POA: Diagnosis not present

## 2022-03-09 DIAGNOSIS — R059 Cough, unspecified: Secondary | ICD-10-CM | POA: Diagnosis not present

## 2022-03-09 MED ORDER — METFORMIN HCL 1000 MG PO TABS
1000.0000 mg | ORAL_TABLET | Freq: Two times a day (BID) | ORAL | 1 refills | Status: DC
Start: 2022-03-09 — End: 2022-08-26

## 2022-03-09 NOTE — Telephone Encounter (Signed)
New rx sent in. Pt advised of this and results as well.  ?

## 2022-03-09 NOTE — Telephone Encounter (Signed)
[facility] - Client ?TELEPHONE ADVICE RECORD ?[facility]? ?Patient Name: ?[name] [name] ?Gender: Male ?DOB: 1951-02-28 ?Age: 71 Y 2 M 23 D ?Return ?Phone ?Number: ?[id number] ?(Primary) ?Address: ?City/ ?State/ ?Zip: [place] ?[address] ?Client [facility] - Client ?Client Site [facility] ?Provider Crissie Sickles - MD ?Contact Type Call ?Who Is Calling Patient / Member / Family / Caregiver ?Call Type Triage / Clinical ?Relationship To Patient Self ?Return Phone Number [phone] (Primary) ?Chief Complaint Medication reaction ?Reason for Call Medication Question / Request ?Initial Comment Caller states he is needing medication refill, no ?current symptoms. ?Translation No ?Nurse Assessment ?Nurse: Kirk Ruths, RN, [facility] Date/Time Eilene Ghazi Time): 03/06/2022 11:51:50 AM ?Confirm and document reason for call. If ?symptomatic, describe symptoms. ?---Caller states he needs a refill on his metformin no ?s/s of high blood sugar took his last one yesterday He ?has moved his prescriptions to [facility] in [place] ?[phone] and it has no refills on it He has not had ?it filled at this pharmacy so they cannot do a loaner ?dose he will be without the medication for a total of 4 ?days ?Does the patient have any new or worsening ?symptoms? ---No ?Nurse: Kirk Ruths, RN, [facility] Date/Time (Eastern Time): 03/06/2022 12:18:31 PM ?Please select the assessment type ---Verbal order / New medication order ?Additional Documentation ?---Metformin '500mg'$  tablet one by mouth twice daily ?for 5 days dispense 10 tablets patient should have ?regualr prescriptions filled on Monday when office ?reopens No refills ?Does the client directives allow for assistance with ?medications after hours? ---Yes ?Other current medications? ---Unknown ?Medication allergies? ---No ?Pharmacy name and phone number. ---[facility] [phone] ?Does the client directive allow for RN to call in  the ?medication order to the pharmacy? ---Yes ?PLEASE NOTE: All timestamps contained within this report are represented as [place] Standard Time. ?CONFIDENTIALTY NOTICE: This fax transmission is intended only for the addressee. It contains information that is legally privileged, confidential or ?otherwise protected from use or disclosure. If you are not the intended recipient, you are strictly prohibited from reviewing, disclosing, copying using ?or disseminating any of this information or taking any action in reliance on or regarding this information. If you have received this fax in error, please ?notify us immediately by telephone so that we can arrange for its return to [name]. Phone: [phone], Toll-Free: [phone], Fax: [phone] ?Page: 2 of 2 ?Call Id: [id number] ?Disp. Time (Eastern ?Time) Disposition Final User ?03/06/2022 12:02:45 PM Called On-Call Provider Kirk Ruths, RN, [facility] ?03/06/2022 12:14:33 PM Pharmacy Call Kirk Ruths, RN, [facility] ?Reason: Metformin '500mg'$  po twice ?daily dispense #10 no refills ?03/06/2022 12:21:16 PM Clinical Call Yes Kirk Ruths, RN, [facility] ?Comments ?User: Tawni Levy, RN Date/Time Eilene Ghazi Time): 03/06/2022 12:21:02 PM ?Patient is made aware od MD directions and verbalizes understanding that this is a short term loaner dose type ?prescription and to get his regular script filled on Monday ?Paging ?DoctorName Phone DateTime Result/ ?Outcome Message Type Notes ?Waunita Schooner- MD [phone] ?03/06/2022 ?12:02:45 ?PM ?Called On ?Call Provider - ?Reached ?Doctor Paged ?Waunita Schooner- MD ?03/06/2022 ?12:05:34 ?PM ?Spoke with On ?Call - General Message Result ?Call patient in 10 pills of ?Metformin to do patient until ?he can get regular script from ?office as he does not know for ?sure what strength he uses ?

## 2022-03-16 ENCOUNTER — Other Ambulatory Visit: Payer: Self-pay | Admitting: Family Medicine

## 2022-03-16 DIAGNOSIS — Z20822 Contact with and (suspected) exposure to covid-19: Secondary | ICD-10-CM | POA: Diagnosis not present

## 2022-03-16 MED ORDER — GABAPENTIN 300 MG PO CAPS: 900.0000 mg | ORAL_CAPSULE | Freq: Every day | ORAL | 1 refills | Status: DC

## 2022-03-16 NOTE — Telephone Encounter (Signed)
Gabapentin refilled

## 2022-03-16 NOTE — Telephone Encounter (Signed)
RF request for gabapentin ?LOV: 02/24/22 ?Next ov: 06/04/22 ?Last written: 05/17/19, historical provider ? ?Please fill, if appropriate. ? ?

## 2022-03-17 ENCOUNTER — Ambulatory Visit (HOSPITAL_COMMUNITY)
Admission: RE | Admit: 2022-03-17 | Discharge: 2022-03-17 | Disposition: A | Discharge status: Home / Self Care | Payer: Medicare Other | Source: Ambulatory Visit | Attending: Otolaryngology | Admitting: Otolaryngology

## 2022-03-17 DIAGNOSIS — J329 Chronic sinusitis, unspecified: Secondary | ICD-10-CM | POA: Insufficient documentation

## 2022-03-17 DIAGNOSIS — J3489 Other specified disorders of nose and nasal sinuses: Secondary | ICD-10-CM | POA: Diagnosis not present

## 2022-03-17 DIAGNOSIS — J320 Chronic maxillary sinusitis: Secondary | ICD-10-CM | POA: Diagnosis not present

## 2022-03-17 IMAGING — CT CT MAXILLOFACIAL W/O CM
3 of 5 series · 13 of 47 positions shown, 15 images · non-contrast
Comparison: Brain MRI [DATE]

CLINICAL DATA: Chronic sinusitis

EXAM:
CT MAXILLOFACIAL WITHOUT CONTRAST
TECHNIQUE: Multidetector CT images of the paranasal sinuses were obtained using
the standard protocol without intravenous contrast.
RADIATION DOSE REDUCTION: This exam was performed according to the
departmental dose-optimization program which includes automated
exposure control, adjustment of the mA and/or kV according to
patient size and/or use of iterative reconstruction technique.

[Series 2: standard · axial · 0.35mm/px · z∈[-20,+90]mm · 8 of 127 slices shown, 10 images]
[im 9/127  brain]
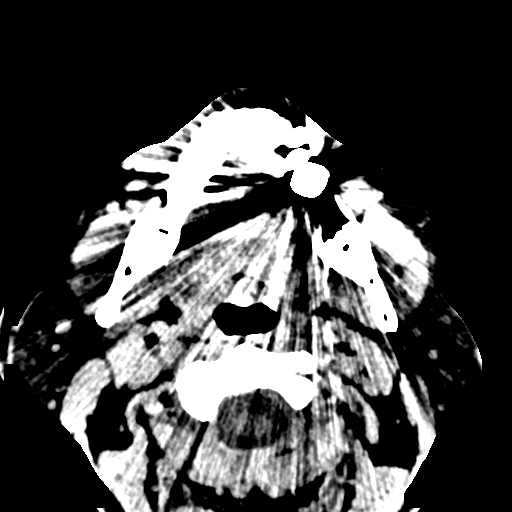
[im 9/127  bone]
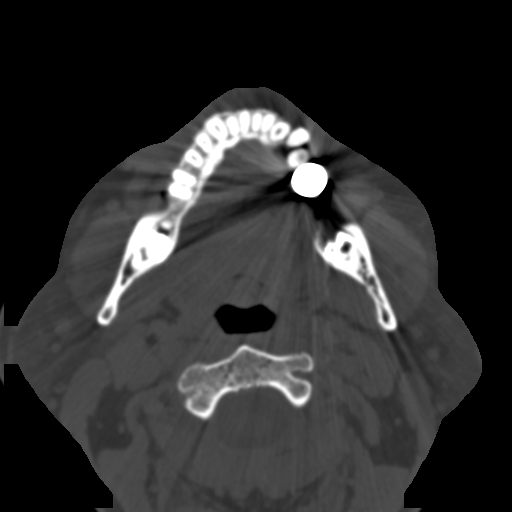
[im 27/127  bone]
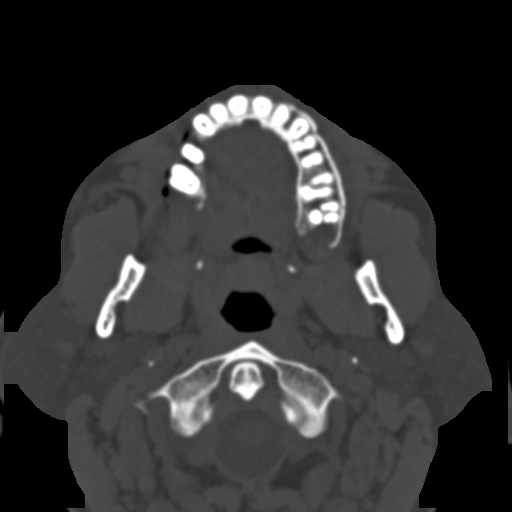
[im 40/127  bone]
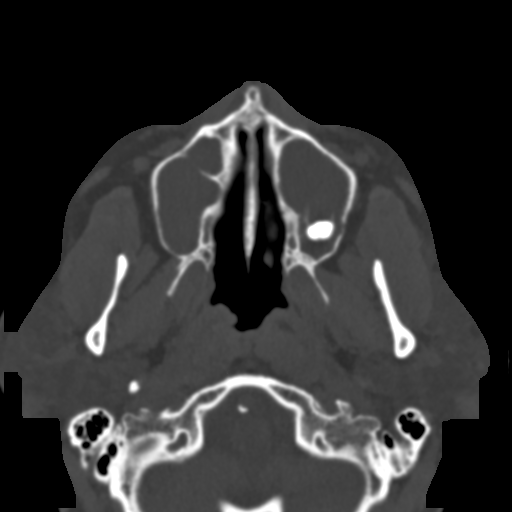
[im 57/127  bone]
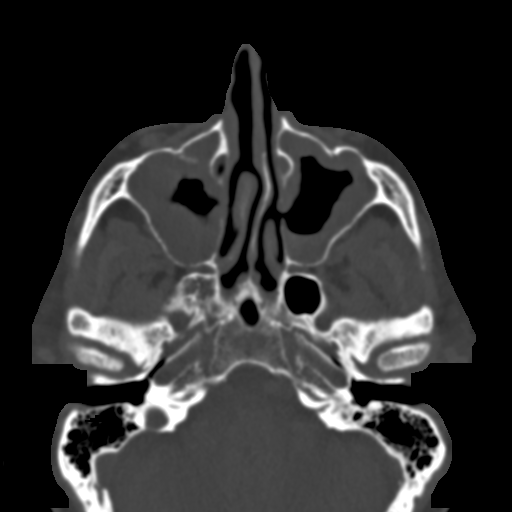
[im 70/127  brain]
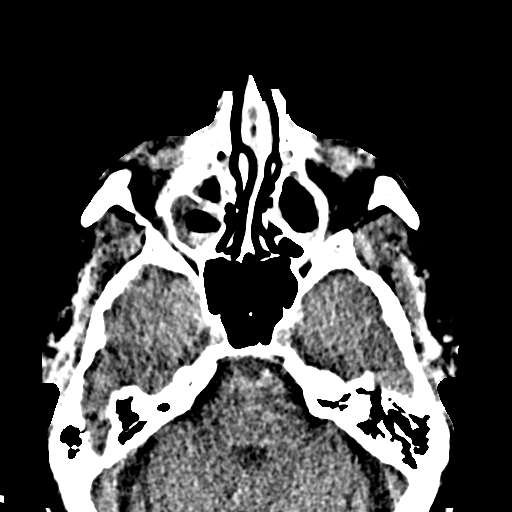
[im 70/127  bone]
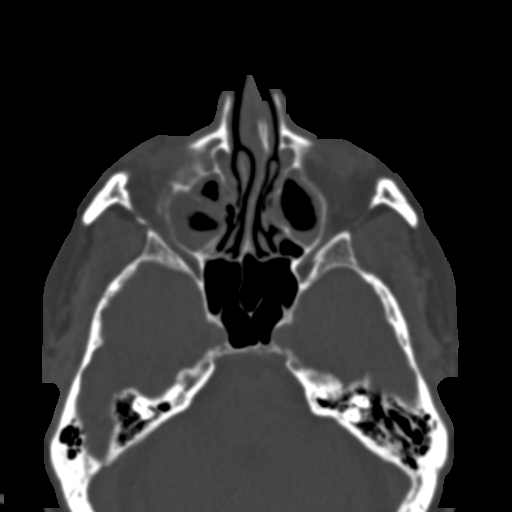
[im 87/127  bone]
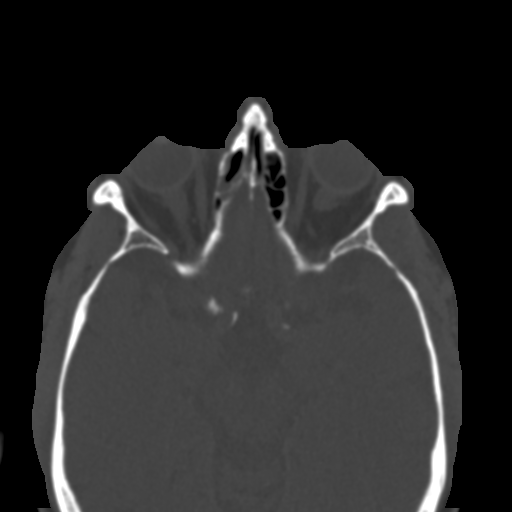
[im 100/127  bone]
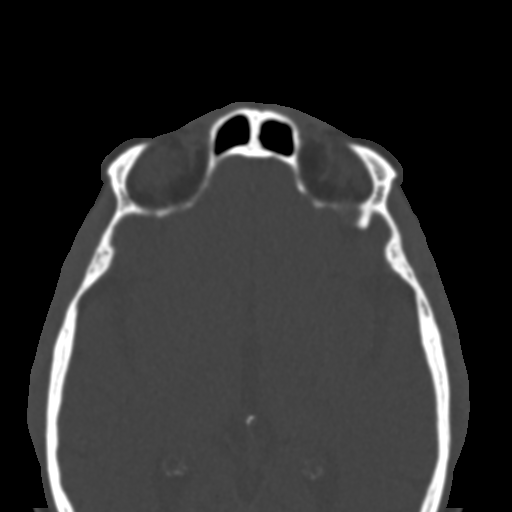
[im 118/127  bone]
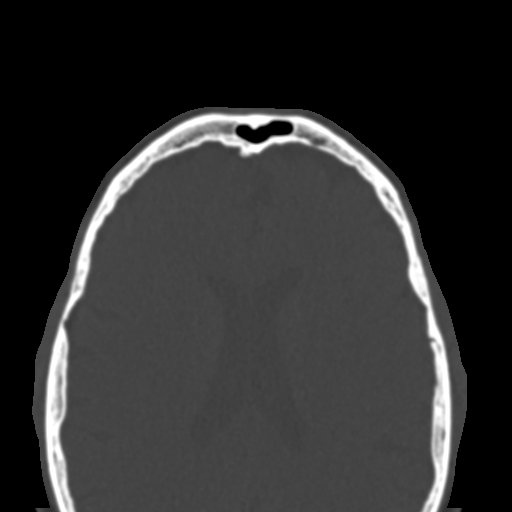

[Series 4: coronal · coronal · 0.28mm/px · 3 of 77 slices shown]
[im 20/77  bone]
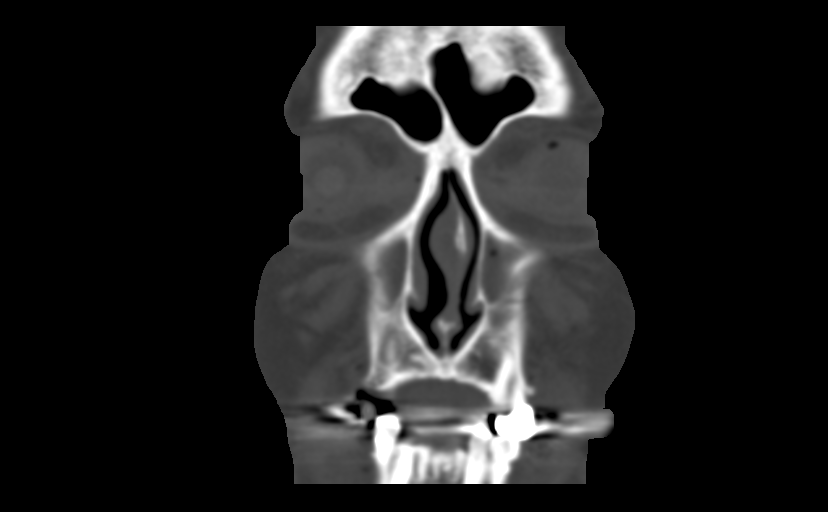
[im 39/77  bone]
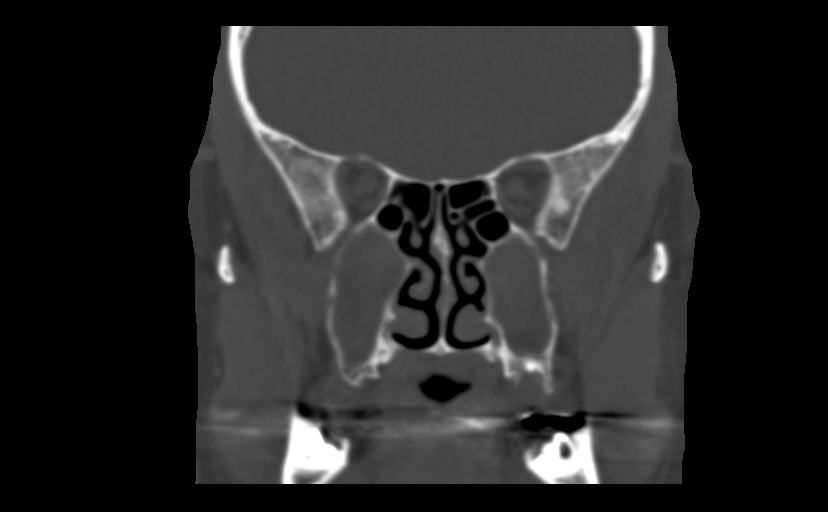
[im 58/77  bone]
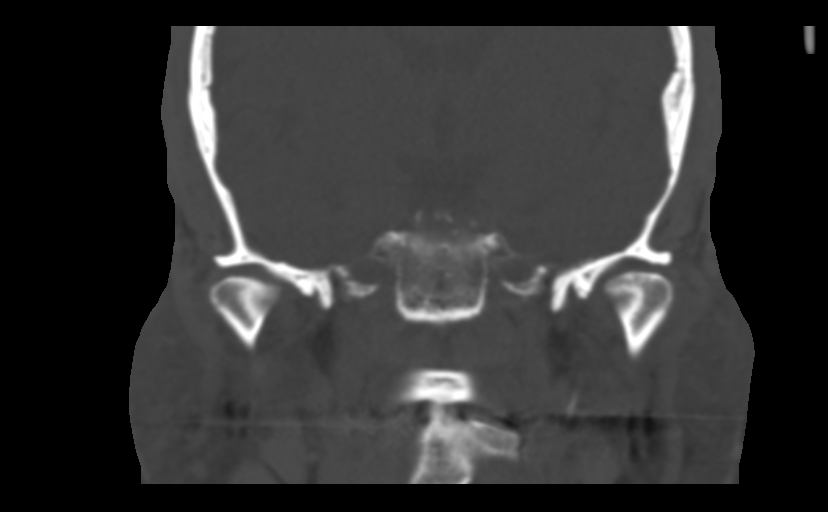

[Series 7: sag bone · sagittal · 0.25mm/px · 2 of 93 slices shown]
[im 31/93  bone]
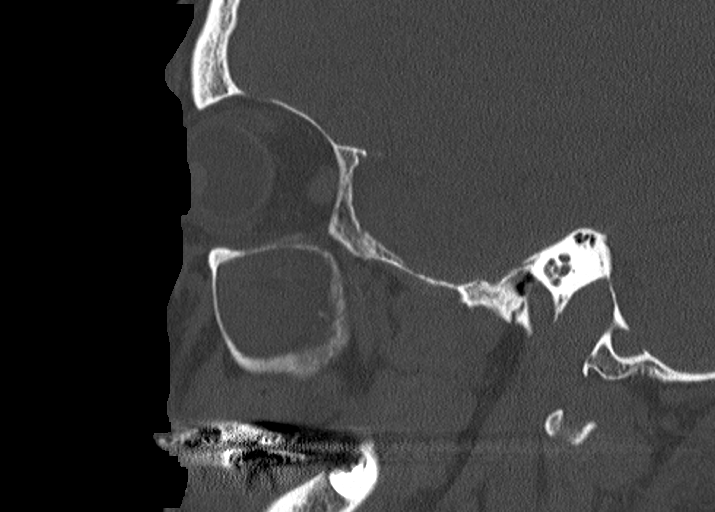
[im 62/93  bone]
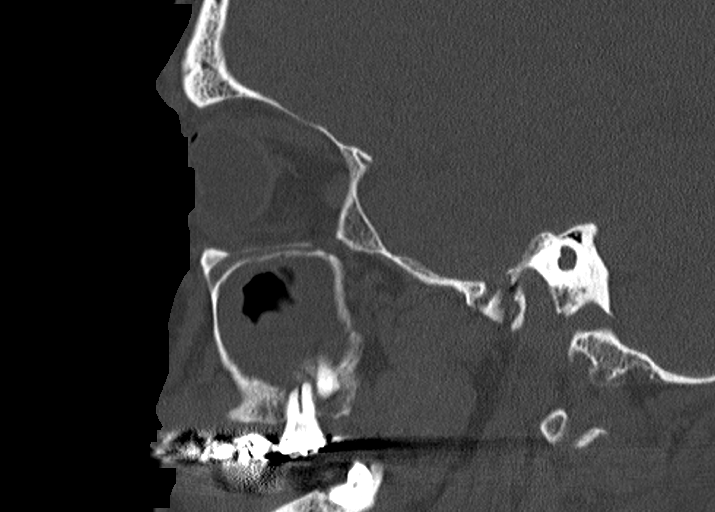

[13 of 47 positions shown; findings below may reference images not displayed]

FINDINGS: Paranasal sinuses:

Frontal: Mild mucosal thickening at the inferior frontal sinuses.
Patent frontal ethmoidal recesses

Ethmoid: Mild generalized mucosal thickening in the paranasal
sinuses

Maxillary: Extensive opacification with mucosal thickening and
fluid/secretions bilaterally. There could be an odontogenic
component given unerupted left upper wisdom tooth with periapical
lucency that appears dehiscent anteriorly. On the right there is a
devitalized premolar with periapical lucency that is also dehiscent
into the maxillary sinus.

Sphenoid: Normally aerated. Patent sphenoethmoidal recesses.

Right ostiomeatal unit: Opacified infundibulum by soft tissue
density. Infraorbital ridge which is nonobstructive.

Left ostiomeatal unit: Opacified infundibulum by mucosal disease.
Secondary ostium which appears patent.

Nasal passages: Patent. Intact nasal septum is midline.

Anatomy: No pneumatization superior to anterior ethmoid notches.
Sellar sphenoid pneumatization pattern. No onodi cell.

Other: No incidental finding in the soft tissues, orbits, or brain
on soft tissue windows.
IMPRESSION: Active sinusitis most extensive in the bilateral maxillary sinus
where mucosal disease effaces both maxillary infundibula. There
could be odontogenic contribution at the bilateral maxillary
sinuses, as above.

## 2022-03-25 DIAGNOSIS — J342 Deviated nasal septum: Secondary | ICD-10-CM | POA: Diagnosis not present

## 2022-03-25 DIAGNOSIS — J320 Chronic maxillary sinusitis: Secondary | ICD-10-CM | POA: Diagnosis not present

## 2022-03-25 DIAGNOSIS — J322 Chronic ethmoidal sinusitis: Secondary | ICD-10-CM | POA: Diagnosis not present

## 2022-03-25 DIAGNOSIS — J343 Hypertrophy of nasal turbinates: Secondary | ICD-10-CM | POA: Diagnosis not present

## 2022-03-30 ENCOUNTER — Ambulatory Visit (INDEPENDENT_AMBULATORY_CARE_PROVIDER_SITE_OTHER): Payer: Medicare Other | Admitting: Neurology

## 2022-03-30 ENCOUNTER — Encounter: Payer: Self-pay | Admitting: Neurology

## 2022-03-30 ENCOUNTER — Other Ambulatory Visit (INDEPENDENT_AMBULATORY_CARE_PROVIDER_SITE_OTHER): Payer: Medicare Other

## 2022-03-30 VITALS — BP 135/83 | HR 104 | Ht 70.0 in | Wt 250.0 lb

## 2022-03-30 DIAGNOSIS — G3184 Mild cognitive impairment, so stated: Secondary | ICD-10-CM

## 2022-03-30 LAB — VITAMIN B12: Vitamin B-12: 1163 pg/mL — ABNORMAL HIGH (ref 211–911)

## 2022-03-30 LAB — TSH: TSH: 1.18 u[IU]/mL (ref 0.35–5.50)

## 2022-03-30 NOTE — Patient Instructions (Addendum)
Good to see you. ? ?Schedule brain MRI without contrast ? ?2. Schedule repeat Neurocognitive testing ? ?3. Check TSH and B12 ? ?4. If you are interested in the driving assessment, you can contact the following: ? ?[facility] [employer] in [place] ? ?[place] [phone] ? ?[facility] [phone] ? ?[facility] [phone] or [phone] ? ?5. If interested, there are some activities which have therapeutic value and can be useful in keeping you cognitively stimulated. You can try this website: https://www.[facility].org/get-to-know-barrow/centers-programs/neurorehabilitation-center/neuro-rehab-apps-and-games/ which has options, categorized by level of difficulty. ? ?6. Follow-up after tests, call for any changes ? ? ?RECOMMENDATIONS FOR ALL PATIENTS WITH MEMORY PROBLEMS: ?1. Continue to exercise (Recommend 30 minutes of walking everyday, or 3 hours every week) ?2. Increase social interactions - continue going to [place] and enjoy social gatherings with friends and family ?3. Eat healthy, avoid fried foods and eat more fruits and vegetables ?4. Maintain adequate blood pressure, blood sugar, and blood cholesterol level. Reducing the risk of stroke and cardiovascular disease also helps promoting better memory. ?5. Avoid stressful situations. Live a simple life and avoid aggravations. Organize your time and prepare for the next day in anticipation. ?6. Sleep well, avoid any interruptions of sleep and avoid any distractions in the bedroom that may interfere with adequate sleep quality ?7. Avoid sugar, avoid sweets as there is a strong link between excessive sugar intake, diabetes, and cognitive impairment ?The Mediterranean diet has been shown to help patients reduce the risk of progressive memory disorders and reduces cardiovascular risk. This includes eating fish, eat fruits and green leafy vegetables, nuts like almonds and hazelnuts, walnuts, and also use  olive oil. Avoid fast foods and fried foods as much as possible. Avoid sweets and sugar as sugar use has been linked to worsening of memory function. ? ? ?

## 2022-03-30 NOTE — Progress Notes (Signed)
Please inform patient blood tests are normal, thanks
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
## 2022-03-31 ENCOUNTER — Telehealth: Payer: Self-pay

## 2022-03-31 NOTE — Telephone Encounter (Signed)
Pt called Per DPR left a voice mail that labs are normal  ?

## 2022-03-31 NOTE — Telephone Encounter (Signed)
-----   Message from Rondel Jumbo, PA-C sent at 03/30/2022  7:49 PM EDT ----- ?Please inform patient blood tests are normal, thanks  ?

## 2022-04-02 ENCOUNTER — Encounter: Payer: Self-pay | Admitting: Family Medicine

## 2022-04-02 ENCOUNTER — Other Ambulatory Visit (INDEPENDENT_AMBULATORY_CARE_PROVIDER_SITE_OTHER): Payer: Self-pay | Admitting: Adult Health

## 2022-04-02 DIAGNOSIS — Z794 Long term (current) use of insulin: Secondary | ICD-10-CM

## 2022-04-02 MED ORDER — SEMAGLUTIDE (1 MG/DOSE) 4 MG/3ML ~~LOC~~ SOPN: 1.0000 mg | PEN_INJECTOR | SUBCUTANEOUS | 0 refills | Status: DC

## 2022-04-02 NOTE — Telephone Encounter (Signed)
LAST APPOINTMENT DATE: 02/24/22 ?NEXT APPOINTMENT DATE: 04/07/22 ? ? ?[place] [place] [address] ?[address] ?[place] [address] ?Phone: [phone] Fax: [phone] ? ?Patient is requesting a refill of the following medications: ?Requested Prescriptions  ? ?Pending Prescriptions Disp Refills  ? Semaglutide, 1 MG/DOSE, 4 MG/3ML SOPN 3 mL 0  ?  Sig: Inject 1 mg as directed once a week.  ? ? ?Date last filled: 003/28/23 ?Previously prescribed by Mina Marble ? ?Lab Results  ?Component Value Date  ? HGBA1C 6.8 (H) 03/03/2022  ? HGBA1C 6.7 (H) 12/09/2021  ? HGBA1C 6.0 (H) 09/15/2021  ? ?Lab Results  ?Component Value Date  ? MICROALBUR <0.7 05/16/2020  ? [place] 71 04/21/2021  ? CREATININE 1.05 03/03/2022  ? ?Lab Results  ?Component Value Date  ? VD25OH 98.8 09/15/2021  ? VD25OH 42.0 04/21/2021  ? VD25OH 59.0 01/28/2021  ? ? ?BP Readings from Last 3 Encounters:  ?03/30/22 135/83  ?03/03/22 136/71  ?02/24/22 119/70  ? ? ?

## 2022-04-04 ENCOUNTER — Ambulatory Visit
Admission: RE | Admit: 2022-04-04 | Discharge: 2022-04-04 | Disposition: A | Discharge status: Home / Self Care | Payer: Medicare Other | Source: Ambulatory Visit | Attending: Neurology | Admitting: Neurology

## 2022-04-04 DIAGNOSIS — J320 Chronic maxillary sinusitis: Secondary | ICD-10-CM | POA: Diagnosis not present

## 2022-04-04 DIAGNOSIS — G3184 Mild cognitive impairment, so stated: Secondary | ICD-10-CM

## 2022-04-04 IMAGING — MR MR HEAD W/O CM
11 series · 48 of 48 positions shown · non-contrast
Comparison: [DATE]

CLINICAL DATA: Mild cognitive impairment. Memory and attention
changes over the last few years.

EXAM:
MRI HEAD WITHOUT CONTRAST
TECHNIQUE: Multiplanar, multiecho pulse sequences of the brain and surrounding
structures were obtained without intravenous contrast.

[Series 5: T1 · sagittal · 4.0mm · 0.75mm/px · 2 of 31 slices shown (1 of 2)]
[im 1/31]
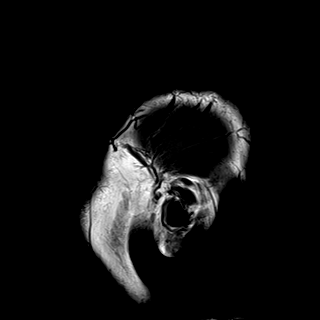
[im 31/31]
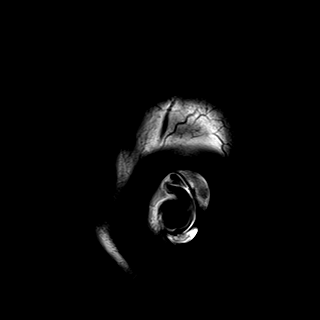

[Series 6: DWI · axial · 3.0mm · 1.02mm/px · z∈[-66,+74]mm · 10 of 160 slices shown (1 of 3)]
[im 1/160]
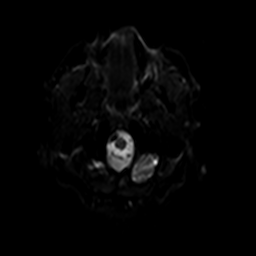
[im 18/160]
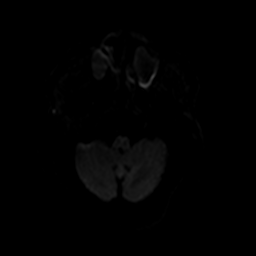
[im 36/160]
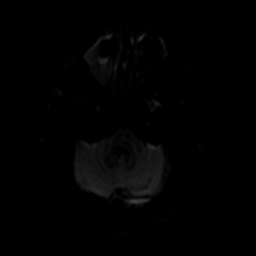
[im 54/160]
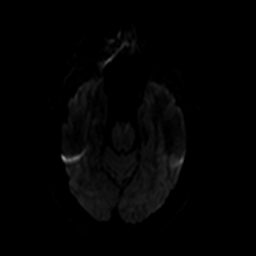
[im 71/160]
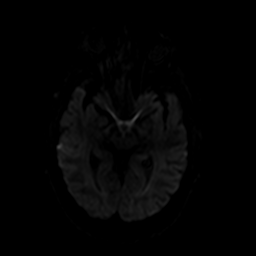
[im 89/160]
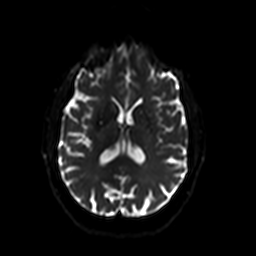
[im 107/160]
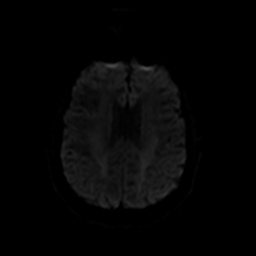
[im 124/160]
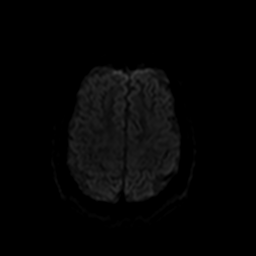
[im 142/160]
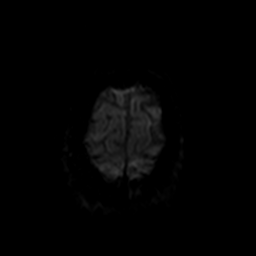
[im 160/160]
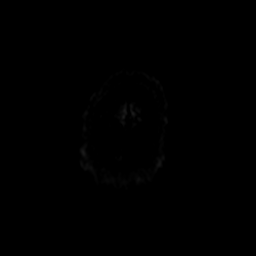

[Series 7: ax dwi_tracew · axial · 3.0mm · 1.02mm/px · z∈[-66,+74]mm · 5 of 80 slices shown]
[im 1/80]
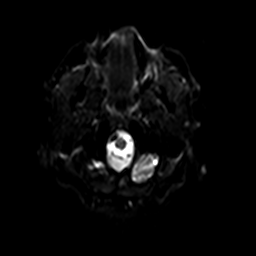
[im 20/80]
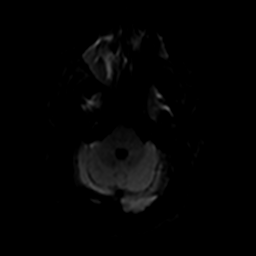
[im 40/80]
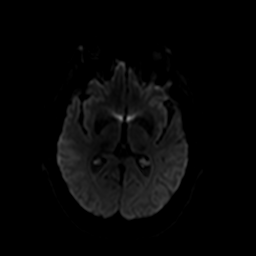
[im 60/80]
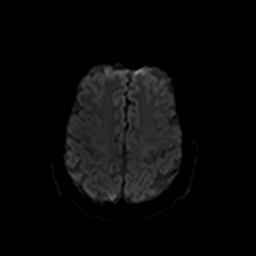
[im 80/80]
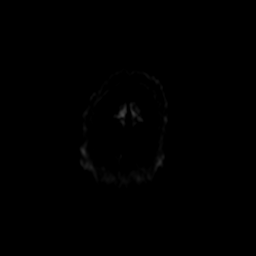

[Series 8: ax dwi_adc · axial · 3.0mm · 1.02mm/px · z∈[-66,+74]mm · 3 of 39 slices shown]
[im 1/39]
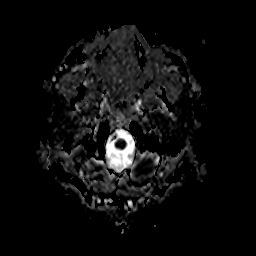
[im 20/39]
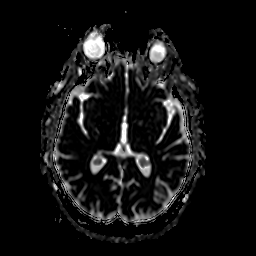
[im 39/39]
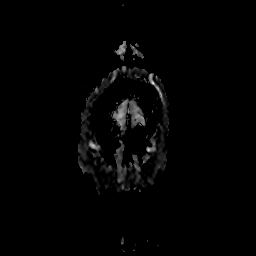

[Series 9: DWI · coronal · 5.0mm · 1.44mm/px · 4 of 60 slices shown (2 of 3)]
[im 1/60]
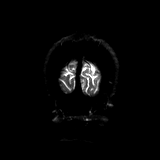
[im 20/60]
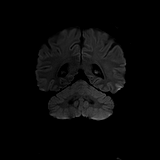
[im 40/60]
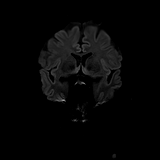
[im 60/60]
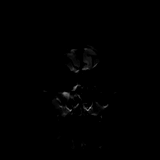

[Series 10: DWI · coronal · 5.0mm · 1.44mm/px · 2 of 30 slices shown (3 of 3)]
[im 1/30]
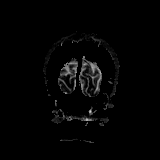
[im 30/30]
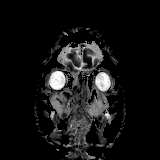

[Series 11: T2 · axial · 4.0mm · 0.41mm/px · z∈[-39,+94]mm · 2 of 27 slices shown (1 of 2)]
[im 1/27]
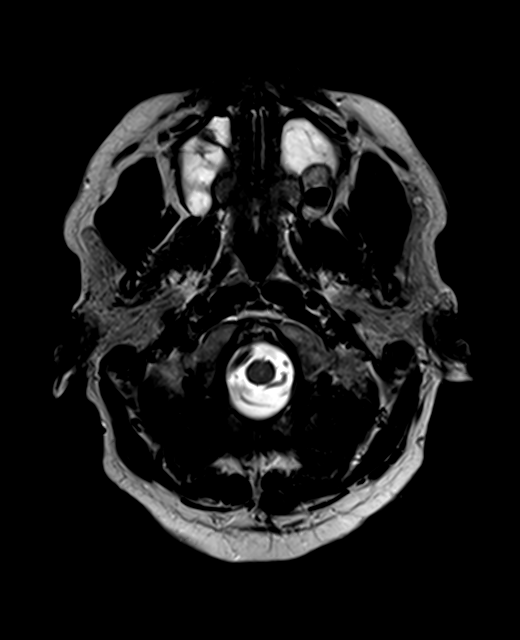
[im 27/27]
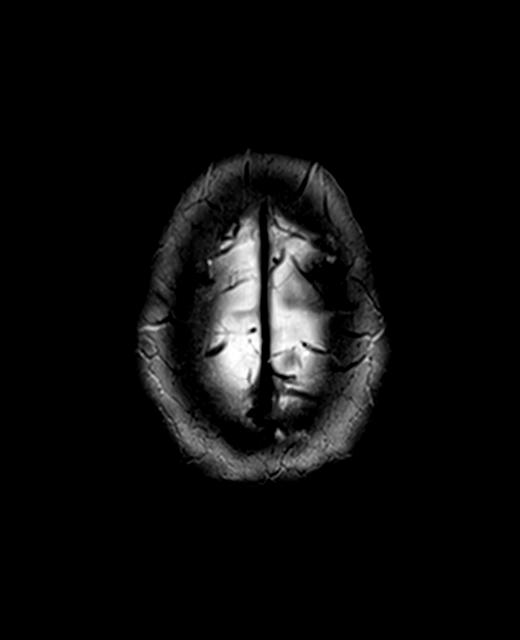

[Series 12: FLAIR · axial · 3.0mm · 0.72mm/px · z∈[-49,+99]mm · 2 of 26 slices shown]
[im 1/26]
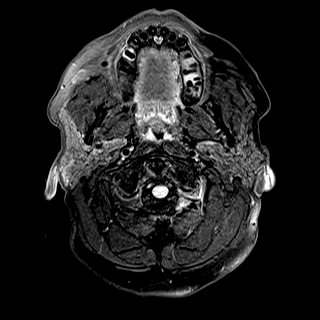
[im 26/26]
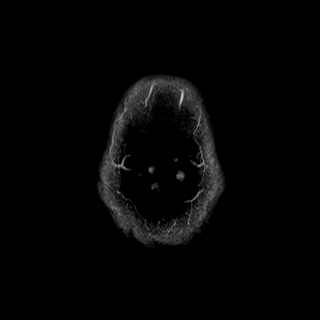

[Series 14: swi_images · axial · 1.5mm · 0.90mm/px · z∈[-44,+96]mm · 6 of 96 slices shown]
[im 1/96]
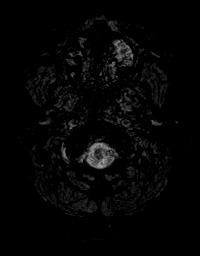
[im 20/96]
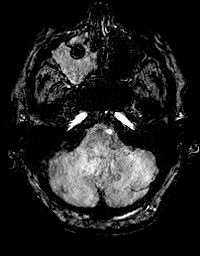
[im 39/96]
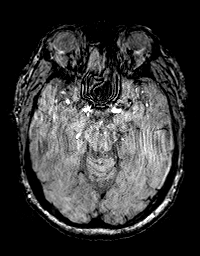
[im 58/96]
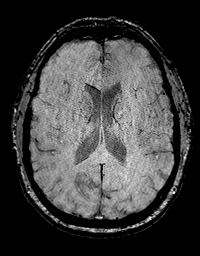
[im 77/96]
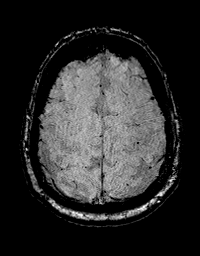
[im 96/96]
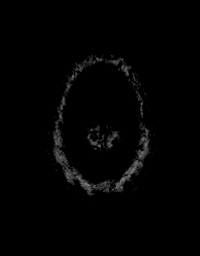

[Series 15: T1 · axial · 1.0mm · 1.02mm/px · z∈[-61,+98]mm · 10 of 160 slices shown (2 of 2)]
[im 1/160]
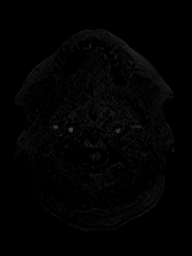
[im 18/160]
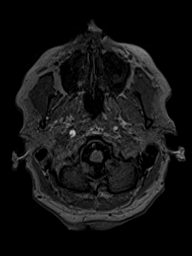
[im 36/160]
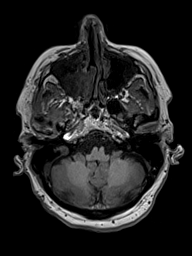
[im 54/160]
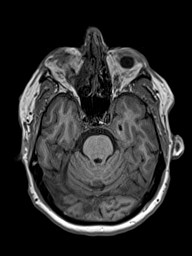
[im 71/160]
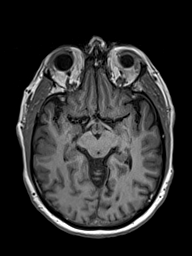
[im 89/160]
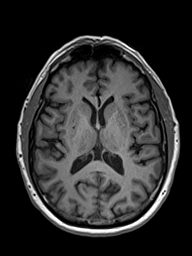
[im 107/160]
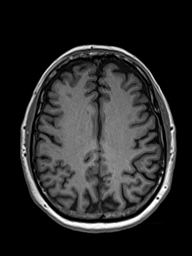
[im 124/160]
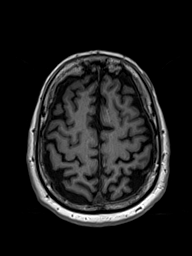
[im 142/160]
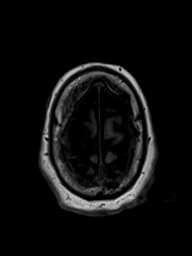
[im 160/160]
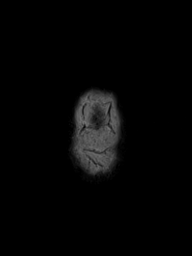

[Series 16: T2 · coronal · 4.5mm · 0.36mm/px · 2 of 30 slices shown (2 of 2)]
[im 1/30]
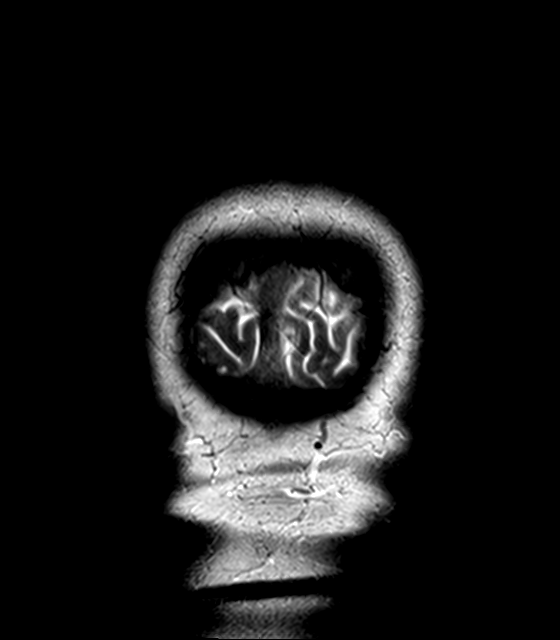
[im 30/30]
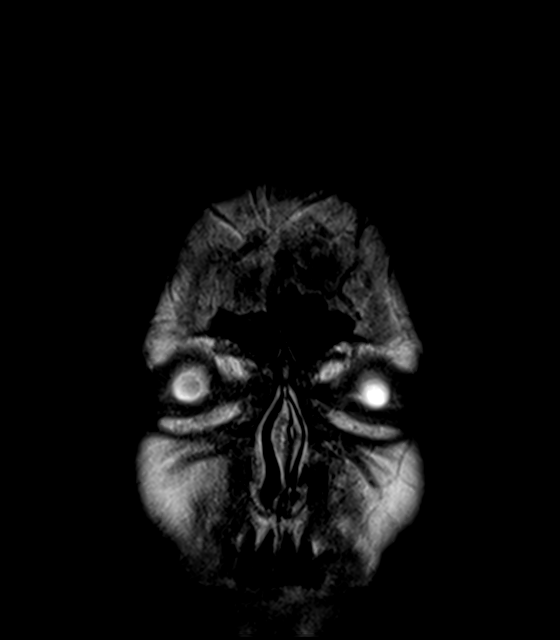

[48 of 48 positions shown; findings below may reference images not displayed]

FINDINGS: Brain: Diffusion imaging does not show any acute or subacute
infarction. No focal abnormality affects the brainstem or
cerebellum. Cerebral hemispheres show age related volume loss of a
mild degree, not grossly changed since the study of [TA]. The brain
does not show small-vessel disease or any old large vessel insult.
No mass lesion, hemorrhage, hydrocephalus or extra-axial collection.

Vascular: Major vessels at the base of the brain show flow.

Skull and upper cervical spine: Negative

Sinuses/Orbits: Pronounced inflammatory changes of the paranasal
sinuses affecting the maxillary and ethmoid regions.

Other: None
IMPRESSION: No change in appearance of the brain since [TA]. Mild generalized
volume loss. No evidence of old or acute small or large vessel
infarction or of hydrocephalus.

Pronounced mucosal inflammatory changes of the maxillary and ethmoid
sinuses.

## 2022-04-06 ENCOUNTER — Telehealth: Payer: Self-pay

## 2022-04-06 DIAGNOSIS — Z20822 Contact with and (suspected) exposure to covid-19: Secondary | ICD-10-CM | POA: Diagnosis not present

## 2022-04-06 NOTE — Telephone Encounter (Signed)
-----   Message from Cameron Sprang, MD sent at 04/06/2022  8:18 AM EDT ----- ?Pls let him know the MRI did not show any change from 2021, no tumor, stroke, or bleed. Thanks ?

## 2022-04-06 NOTE — Telephone Encounter (Signed)
Pt called an informed MRI did not show any change from 2021, no tumor, stroke, or bleed ?

## 2022-04-07 ENCOUNTER — Ambulatory Visit (INDEPENDENT_AMBULATORY_CARE_PROVIDER_SITE_OTHER): Payer: Medicare Other | Admitting: Adult Health

## 2022-04-07 ENCOUNTER — Encounter (INDEPENDENT_AMBULATORY_CARE_PROVIDER_SITE_OTHER): Payer: Self-pay | Admitting: Adult Health

## 2022-04-07 VITALS — BP 119/70 | HR 81 | Temp 98.1°F | Ht 70.0 in | Wt 241.0 lb

## 2022-04-07 DIAGNOSIS — Z6832 Body mass index (BMI) 32.0-32.9, adult: Secondary | ICD-10-CM | POA: Diagnosis not present

## 2022-04-07 DIAGNOSIS — E119 Type 2 diabetes mellitus without complications: Secondary | ICD-10-CM | POA: Diagnosis not present

## 2022-04-07 DIAGNOSIS — F3289 Other specified depressive episodes: Secondary | ICD-10-CM | POA: Diagnosis not present

## 2022-04-07 DIAGNOSIS — Z7984 Long term (current) use of oral hypoglycemic drugs: Secondary | ICD-10-CM

## 2022-04-07 DIAGNOSIS — E669 Obesity, unspecified: Secondary | ICD-10-CM | POA: Diagnosis not present

## 2022-04-07 MED ORDER — BUPROPION HCL ER (SR) 150 MG PO TB12: ORAL_TABLET | ORAL | 0 refills | Status: DC

## 2022-04-14 NOTE — Progress Notes (Signed)
? ? ? ?Chief Complaint:  ? ?OBESITY ?[name] [name] is here to discuss his progress with his obesity treatment plan along with follow-up of his obesity related diagnoses. [name] [name] is on the Category 4 Plan and states he is following his eating plan approximately 80-85 % of the time. [name] [name] states he is doing water aerobics for 60 minutes 3 times per week. ?Today's visit was #: 56 ?Starting weight: 273 ?Starting date: 02/14/18 ?Today's weight: 241 lbs ?Today's date: 04/07/22 ?Total lbs lost to date: 8 ?Total lbs lost since last in-office visit: 7 ? ?Interim History:  ?Down another 7 pounds! ?Interval goal: ?1) increase water intake  ?2) increase daily walking  ? ?Lose 36 pounds to achieve BMI less than 30.  Current weight 241 pounds. ? ?Of note: He will attend a wedding in [place], [place]- Aug 2023. ? ?Subjective:  ? ?1. Type 2 diabetes mellitus without complication, without long-term current use of insulin ([name] [name]) ?Currently on metformin 1000 mg twice daily, Levemir 40 units-managed by PCP. ?He is also on Ozempic 1 mg weekly- managed by PCP. ?Fasting blood glucose 80-110, outlier 130.   ?No symptoms of hypoglycemia. ? ?2. Other depression, with emotional eating  ?BP/heart rate at goal at office visit. ?He reports stable mood, denies suicidal ideation/homicidal ideation. ?He is on bupropion SR 150 mg twice daily. ? ? ?Assessment/Plan:  ? ?1. Type 2 diabetes mellitus without complication, without long-term current use of insulin ([name] [name]) ?Continue current antidiabetic therapy per PCP and [facility]. ? ?2. Other depression, with emotional eating  ?Refill: ?- buPROPion (WELLBUTRIN SR) 150 MG 12 hr tablet; TAKE 1 TABLET(150 MG) BY MOUTH TWICE DAILY  Dispense: 180 tablet; Refill: 0 ? ?3. Obesity, current BMI 34.6 ? ?[name] [name] is currently in the action stage of change. As such, his goal is to continue with weight loss efforts. He has agreed to the Category 4 Plan.  ? ?Exercise goals: As is. ? ?Behavioral modification strategies: increasing lean protein  intake, decreasing simple carbohydrates, meal planning and cooking strategies, keeping healthy foods in the home, and planning for success. ? ?[name] [name] has agreed to follow-up with our clinic in 5 weeks. He was informed of the importance of frequent follow-up visits to maximize his success with intensive lifestyle modifications for his multiple health conditions.  ? ? ?Objective:  ? ?Blood pressure 119/70, pulse 81, temperature 98.1 ?F (36.7 ?C), height '5\' 10"'$  (1.778 m), weight 241 lb (109.3 kg), SpO2 97 %. ?Body mass index is 34.58 kg/m?. ? ?General: Cooperative, alert, well developed, in no acute distress. ?HEENT: Conjunctivae and lids unremarkable. ?Cardiovascular: Regular rhythm.  ?Lungs: Normal work of breathing. ?Neurologic: No focal deficits.  ? ?Lab Results  ?Component Value Date  ? CREATININE 1.05 03/03/2022  ? BUN 28 (H) 03/03/2022  ? NA 139 03/03/2022  ? K 4.0 03/03/2022  ? CL 103 03/03/2022  ? CO2 27 03/03/2022  ? ?Lab Results  ?Component Value Date  ? ALT 21 12/09/2021  ? AST 15 12/09/2021  ? ALKPHOS 33 (L) 12/09/2021  ? BILITOT 0.5 12/09/2021  ? ?Lab Results  ?Component Value Date  ? HGBA1C 6.8 (H) 03/03/2022  ? HGBA1C 6.7 (H) 12/09/2021  ? HGBA1C 6.0 (H) 09/15/2021  ? HGBA1C 5.9 (H) 04/21/2021  ? HGBA1C 5.3 12/02/2020  ? ?Lab Results  ?Component Value Date  ? INSULIN 3.2 09/15/2021  ? INSULIN 5.8 08/29/2020  ? INSULIN 5.3 05/09/2020  ? ?Lab Results  ?Component Value Date  ? TSH 1.18 03/30/2022  ? ?Lab Results  ?Component  Value Date  ? CHOL 134 04/21/2021  ? HDL 46 04/21/2021  ? [place] 69 04/21/2021  ? LDLDIRECT 112.0 07/31/2016  ? TRIG 101 04/21/2021  ? CHOLHDL 2.9 04/21/2021  ? ?Lab Results  ?Component Value Date  ? VD25OH 98.8 09/15/2021  ? VD25OH 42.0 04/21/2021  ? VD25OH 59.0 01/28/2021  ? ?Lab Results  ?Component Value Date  ? WBC 9.3 09/30/2021  ? HGB 12.8 (L) 09/30/2021  ? HCT 37.8 (L) 09/30/2021  ? MCV 87.1 09/30/2021  ? PLT 303 09/30/2021  ? ?No results found for: IRON, TIBC,  FERRITIN ? ?Obesity Behavioral Intervention:  ? ?Approximately 15 minutes were spent on the discussion below. ? ?ASK: ?We discussed the diagnosis of obesity with [name] [name] today and [name] [name] agreed to give [name] permission to discuss obesity behavioral modification therapy today. ? ?ASSESS: ?[name] [name] has the diagnosis of obesity and his BMI today is 34.6. [name] [name] is in the action stage of change.  ? ?ADVISE: ?[name] [name] was educated on the multiple health risks of obesity as well as the benefit of weight loss to improve his health. He was advised of the need for long term treatment and the importance of lifestyle modifications to improve his current health and to decrease his risk of future health problems. ? ?AGREE: ?Multiple dietary modification options and treatment options were discussed and [name] [name] agreed to follow the recommendations documented in the above note. ? ?ARRANGE: ?[name] [name] was educated on the importance of frequent visits to treat obesity as outlined per CMS and USPSTF guidelines and agreed to schedule his next follow up appointment today. ? ?Attestation Statements:  ? ?Reviewed by clinician on day of visit: allergies, medications, problem list, medical history, surgical history, family history, social history, and previous encounter notes. ? ? ?I, [place], FNP, am acting as Location manager for Mina Marble, NP. ? ?I have reviewed the above documentation for accuracy and completeness, and I agree with the above. -  Dulcinea Kinser d. Tacoya Altizer, NP-C  ?

## 2022-04-24 ENCOUNTER — Other Ambulatory Visit: Payer: Self-pay | Admitting: Otolaryngology

## 2022-04-29 ENCOUNTER — Other Ambulatory Visit (INDEPENDENT_AMBULATORY_CARE_PROVIDER_SITE_OTHER): Payer: Self-pay | Admitting: Adult Health

## 2022-04-29 ENCOUNTER — Encounter (INDEPENDENT_AMBULATORY_CARE_PROVIDER_SITE_OTHER): Payer: Self-pay | Admitting: Adult Health

## 2022-04-29 DIAGNOSIS — Z794 Long term (current) use of insulin: Secondary | ICD-10-CM

## 2022-04-29 MED ORDER — SEMAGLUTIDE (1 MG/DOSE) 4 MG/3ML ~~LOC~~ SOPN: 1.0000 mg | PEN_INJECTOR | SUBCUTANEOUS | 0 refills | Status: DC

## 2022-04-29 NOTE — Telephone Encounter (Signed)
LAST APPOINTMENT DATE: 04/07/22 NEXT APPOINTMENT DATE: 05/06/22   [facility], [id number]  [place] [address] Phone: [phone] Fax: [phone]  Patient is requesting a refill of the following medications: No prescriptions requested or ordered in this encounter   Date last filled: 04/02/22 Previously prescribed by [facility]  Lab Results      Component                Value               Date                      HGBA1C                   6.8 (H)             03/03/2022                HGBA1C                   6.7 (H)             12/09/2021                HGBA1C                   6.0 (H)             09/15/2021           Lab Results      Component                Value               Date                      MICROALBUR               <0.7                05/16/2020                LDLCALC                  69                  04/21/2021                CREATININE               1.05                03/03/2022           Lab Results      Component                Value               Date                      VD25OH                   98.8                09/15/2021                VD25OH                   42.0  04/21/2021                VD25OH                   59.0                01/28/2021            BP Readings from Last 3 Encounters: 04/07/22 : 119/70 03/30/22 : 135/83 03/03/22 : 136/71

## 2022-05-06 ENCOUNTER — Encounter (INDEPENDENT_AMBULATORY_CARE_PROVIDER_SITE_OTHER): Payer: Self-pay | Admitting: Adult Health

## 2022-05-06 ENCOUNTER — Ambulatory Visit (INDEPENDENT_AMBULATORY_CARE_PROVIDER_SITE_OTHER): Payer: Medicare Other | Admitting: Adult Health

## 2022-05-06 VITALS — BP 110/64 | HR 88 | Temp 98.0°F | Ht 70.0 in | Wt 237.0 lb

## 2022-05-06 DIAGNOSIS — E669 Obesity, unspecified: Secondary | ICD-10-CM

## 2022-05-06 DIAGNOSIS — Z794 Long term (current) use of insulin: Secondary | ICD-10-CM | POA: Diagnosis not present

## 2022-05-06 DIAGNOSIS — F3289 Other specified depressive episodes: Secondary | ICD-10-CM

## 2022-05-06 DIAGNOSIS — E1169 Type 2 diabetes mellitus with other specified complication: Secondary | ICD-10-CM | POA: Diagnosis not present

## 2022-05-06 DIAGNOSIS — Z6834 Body mass index (BMI) 34.0-34.9, adult: Secondary | ICD-10-CM

## 2022-05-06 MED ORDER — BUPROPION HCL ER (SR) 150 MG PO TB12: ORAL_TABLET | ORAL | 0 refills | Status: DC

## 2022-05-10 ENCOUNTER — Other Ambulatory Visit: Payer: Self-pay | Admitting: Family Medicine

## 2022-05-11 NOTE — Progress Notes (Signed)
Chief Complaint:   OBESITY [name] [name] is here to discuss his progress with his obesity treatment plan along with follow-up of his obesity related diagnoses. [name] [name] is on the Category 4 Plan [name] states he is following his eating plan approximately 75% of the time. [name] [name] states he is doing water aerobics for 60 minutes 3 times per week.  Today's visit was #: 60 Starting weight: 273 lbs Starting date: 02/14/2018 Today's weight: 237 lbs Today's date: 05/06/2022 Total lbs lost to date: 36 Total lbs lost since last in-office visit: 4  Interim History:  He recently canceled water aerobics class due to cost [name] upcoming trips.   July-[place] visit for brother-in-law's funeral. August-[place] wedding.  He is please to have lost 4 lbs since last OV.  Subjective:   1. Type 2 diabetes mellitus with other specified complication, with long-term current use of insulin (HCC) Fasting blood glucose ranges between 90-110s.   He denies symptoms of hypoglycemia.   He is on metformin 1000 mg twice daily, Ozempic 1 mg (Monday), Levemir 40 units daily.   He denies mass in neck, dysphagia, dyspepsia, persistent hoarseness, abd pain, or N/V/Constipation. He endorses evening cravings.  2. Other depression, with emotional eating  He reports stable mood [name] denies suicidal or homicidal ideations.   He is on bupropion SR 150 mg twice daily.  Assessment/Plan:   1. Type 2 diabetes mellitus with other specified complication, with long-term current use of insulin (HCC) Continue current antidiabetic medications.  2. Other depression, with emotional eating  We will refill bupropion SR 150 mg twice daily for 90 days with no refills.  - buPROPion (WELLBUTRIN SR) 150 MG 12 hr tablet; TAKE 1 TABLET(150 MG) BY MOUTH TWICE DAILY  Dispense: 180 tablet; Refill: 0  3. Obesity, current BMI 34.1 [name] [name] is currently in the action stage of change. As such, his goal is to continue with weight loss efforts. He has agreed  to the Category 4 Plan.   Exercise goals: As is.  Behavioral modification strategies: increasing lean protein intake, decreasing simple carbohydrates, meal planning [name] cooking strategies, keeping healthy foods in the home, travel eating strategies, [name] planning for success.  [name] [name] has agreed to follow-up with our clinic in 4 weeks. He was informed of the importance of frequent follow-up visits to maximize his success with intensive lifestyle modifications for his multiple health conditions.   Objective:   Blood pressure 110/64, pulse 88, temperature 98 F (36.7 C), height '5\' 10"'$  (1.778 m), weight 237 lb (107.5 kg), SpO2 96 %. Body mass index is 34.01 kg/m.  General: Cooperative, alert, well developed, in no acute distress. HEENT: Conjunctivae [name] lids unremarkable. Cardiovascular: Regular rhythm.  Lungs: Normal work of breathing. Neurologic: No focal deficits.   Lab Results  Component Value Date   CREATININE 1.05 03/03/2022   BUN 28 (H) 03/03/2022   NA 139 03/03/2022   K 4.0 03/03/2022   CL 103 03/03/2022   CO2 27 03/03/2022   Lab Results  Component Value Date   ALT 21 12/09/2021   AST 15 12/09/2021   ALKPHOS 33 (L) 12/09/2021   BILITOT 0.5 12/09/2021   Lab Results  Component Value Date   HGBA1C 6.8 (H) 03/03/2022   HGBA1C 6.7 (H) 12/09/2021   HGBA1C 6.0 (H) 09/15/2021   HGBA1C 5.9 (H) 04/21/2021   HGBA1C 5.3 12/02/2020   Lab Results  Component Value Date   INSULIN 3.2 09/15/2021   INSULIN 5.8 08/29/2020   INSULIN 5.3 05/09/2020  Lab Results  Component Value Date   TSH 1.18 03/30/2022   Lab Results  Component Value Date   CHOL 134 04/21/2021   HDL 46 04/21/2021   LDLCALC 69 04/21/2021   LDLDIRECT 112.0 07/31/2016   TRIG 101 04/21/2021   CHOLHDL 2.9 04/21/2021   Lab Results  Component Value Date   VD25OH 98.8 09/15/2021   VD25OH 42.0 04/21/2021   VD25OH 59.0 01/28/2021   Lab Results  Component Value Date   WBC 9.3 09/30/2021   HGB 12.8 (L)  09/30/2021   HCT 37.8 (L) 09/30/2021   MCV 87.1 09/30/2021   PLT 303 09/30/2021   No results found for: "IRON", "TIBC", "FERRITIN"  Obesity Behavioral Intervention:   Approximately 15 minutes were spent on the discussion below.  ASK: We discussed the diagnosis of obesity with [name] [name] today [name] [name] [name] agreed to give [name] permission to discuss obesity behavioral modification therapy today.  ASSESS: [name] [name] has the diagnosis of obesity [name] his BMI today is 34.1. [name] [name] is in the action stage of change.   ADVISE: [name] [name] was educated on the multiple health risks of obesity as well as the benefit of weight loss to improve his health. He was advised of the need for long term treatment [name] the importance of lifestyle modifications to improve his current health [name] to decrease his risk of future health problems.  AGREE: Multiple dietary modification options [name] treatment options were discussed [name] [name] [name] agreed to follow the recommendations documented in the above note.  ARRANGE: [name] [name] was educated on the importance of frequent visits to treat obesity as outlined per CMS [name] USPSTF guidelines [name] agreed to schedule his next follow up appointment today.  Attestation Statements:   Reviewed by clinician on day of visit: allergies, medications, problem list, medical history, surgical history, family history, social history, [name] previous encounter notes.   Wilhemena Durie, am acting as transcriptionist for Mina Marble, NP.  I have reviewed the above documentation for accuracy [name] completeness, [name] I agree with the above. -  Brecken Walth d. Bonnell Placzek, NP-C

## 2022-05-20 ENCOUNTER — Other Ambulatory Visit (INDEPENDENT_AMBULATORY_CARE_PROVIDER_SITE_OTHER): Payer: Self-pay | Admitting: Adult Health

## 2022-05-20 DIAGNOSIS — Z794 Long term (current) use of insulin: Secondary | ICD-10-CM

## 2022-05-25 ENCOUNTER — Ambulatory Visit (INDEPENDENT_AMBULATORY_CARE_PROVIDER_SITE_OTHER): Payer: Medicare Other | Admitting: Urology

## 2022-05-25 DIAGNOSIS — N138 Other obstructive and reflux uropathy: Secondary | ICD-10-CM

## 2022-05-25 DIAGNOSIS — N401 Enlarged prostate with lower urinary tract symptoms: Secondary | ICD-10-CM

## 2022-05-25 DIAGNOSIS — N201 Calculus of ureter: Secondary | ICD-10-CM

## 2022-05-26 ENCOUNTER — Encounter (INDEPENDENT_AMBULATORY_CARE_PROVIDER_SITE_OTHER): Payer: Self-pay | Admitting: Adult Health

## 2022-05-26 DIAGNOSIS — Z794 Long term (current) use of insulin: Secondary | ICD-10-CM

## 2022-05-27 NOTE — Telephone Encounter (Signed)
Pt needs refill on Ozempic.

## 2022-05-28 ENCOUNTER — Other Ambulatory Visit (INDEPENDENT_AMBULATORY_CARE_PROVIDER_SITE_OTHER): Payer: Self-pay | Admitting: Adult Health

## 2022-05-28 DIAGNOSIS — Z794 Long term (current) use of insulin: Secondary | ICD-10-CM

## 2022-05-28 MED ORDER — SEMAGLUTIDE (1 MG/DOSE) 4 MG/3ML ~~LOC~~ SOPN: 1.0000 mg | PEN_INJECTOR | SUBCUTANEOUS | 0 refills | Status: DC

## 2022-06-01 ENCOUNTER — Encounter: Payer: Self-pay | Admitting: Family Medicine

## 2022-06-03 NOTE — Telephone Encounter (Signed)
Fasting preferred

## 2022-06-04 ENCOUNTER — Ambulatory Visit: Payer: Medicare Other | Admitting: Family Medicine

## 2022-06-04 DIAGNOSIS — Z794 Long term (current) use of insulin: Secondary | ICD-10-CM

## 2022-06-04 DIAGNOSIS — F3289 Other specified depressive episodes: Secondary | ICD-10-CM

## 2022-06-05 ENCOUNTER — Ambulatory Visit (HOSPITAL_COMMUNITY)
Admission: RE | Admit: 2022-06-05 | Discharge: 2022-06-05 | Disposition: A | Discharge status: Home / Self Care | Payer: Medicare Other | Source: Ambulatory Visit | Attending: Urology | Admitting: Urology

## 2022-06-05 DIAGNOSIS — N201 Calculus of ureter: Secondary | ICD-10-CM

## 2022-06-05 DIAGNOSIS — N200 Calculus of kidney: Secondary | ICD-10-CM | POA: Diagnosis not present

## 2022-06-07 NOTE — Progress Notes (Signed)
Patient rescheduled

## 2022-06-08 ENCOUNTER — Ambulatory Visit (INDEPENDENT_AMBULATORY_CARE_PROVIDER_SITE_OTHER): Payer: Medicare Other | Admitting: Family Medicine

## 2022-06-08 ENCOUNTER — Encounter: Payer: Self-pay | Admitting: Family Medicine

## 2022-06-08 VITALS — BP 124/71 | HR 72 | Temp 98.2°F | Ht 70.0 in | Wt 243.2 lb

## 2022-06-08 DIAGNOSIS — E7800 Pure hypercholesterolemia, unspecified: Secondary | ICD-10-CM | POA: Diagnosis not present

## 2022-06-08 DIAGNOSIS — Z79899 Other long term (current) drug therapy: Secondary | ICD-10-CM

## 2022-06-08 DIAGNOSIS — I10 Essential (primary) hypertension: Secondary | ICD-10-CM | POA: Diagnosis not present

## 2022-06-08 DIAGNOSIS — E119 Type 2 diabetes mellitus without complications: Secondary | ICD-10-CM | POA: Diagnosis not present

## 2022-06-08 LAB — LIPID PANEL
Cholesterol: 129 mg/dL (ref 0–200)
HDL: 47.4 mg/dL (ref >39.00)
LDL Cholesterol: 64 mg/dL (ref 0–99)
NonHDL: 81.85
Total CHOL/HDL Ratio: 3
Triglycerides: 90 mg/dL (ref 0.0–149.0)
VLDL: 18 mg/dL (ref 0.0–40.0)

## 2022-06-08 LAB — COMPREHENSIVE METABOLIC PANEL WITH GFR
ALT: 14 U/L (ref 0–53)
AST: 12 U/L (ref 0–37)
Albumin: 4.4 g/dL (ref 3.5–5.2)
Alkaline Phosphatase: 32 U/L — ABNORMAL LOW (ref 39–117)
BUN: 19 mg/dL (ref 6–23)
CO2: 26 meq/L (ref 19–32)
Calcium: 9.4 mg/dL (ref 8.4–10.5)
Chloride: 104 meq/L (ref 96–112)
Creatinine, Ser: 0.9 mg/dL (ref 0.40–1.50)
GFR: 87.16 mL/min
Glucose, Bld: 78 mg/dL (ref 70–99)
Potassium: 4 meq/L (ref 3.5–5.1)
Sodium: 138 meq/L (ref 135–145)
Total Bilirubin: 0.4 mg/dL (ref 0.2–1.2)
Total Protein: 6.5 g/dL (ref 6.0–8.3)

## 2022-06-08 LAB — POCT GLYCOSYLATED HEMOGLOBIN (HGB A1C)
HbA1c POC (<> result, manual entry): 5.4 % (ref 4.0–5.6)
HbA1c, POC (controlled diabetic range): 5.4 % (ref 0.0–7.0)
HbA1c, POC (prediabetic range): 5.4 % — AB (ref 5.7–6.4)
Hemoglobin A1C: 5.4 % (ref 4.0–5.6)

## 2022-06-08 LAB — MICROALBUMIN / CREATININE URINE RATIO
Creatinine,U: 92.9 mg/dL
Microalb Creat Ratio: 0.8 mg/g (ref 0.0–30.0)
Microalb, Ur: <0.7 mg/dL (ref 0.0–1.9)

## 2022-06-08 MED ORDER — ATORVASTATIN CALCIUM 80 MG PO TABS: 80.0000 mg | ORAL_TABLET | Freq: Every day | ORAL | 1 refills | Status: AC

## 2022-06-08 MED ORDER — TETANUS-DIPHTH-ACELL PERTUSSIS 5-2-15.5 LF-MCG/0.5 IM SUSP: 0.5000 mL | Freq: Once | INTRAMUSCULAR | 0 refills | Status: AC

## 2022-06-08 NOTE — Progress Notes (Signed)
OFFICE VISIT  06/08/2022  CC:  Chief Complaint  Patient presents with   Diabetes    Point of care a1c completed   Hyperlipidemia    Pt is fasting    Patient is a 71 y.o. male who presents for 14-monthfollow-up diabetes and HLD. A/P as of last visit: "1 type 2 diabetes, well controlled lately. Continue Ozempic 1 mg subcu weekly. Continue to adjust Levemir dose to keep glucose around 100 fasting.-->  Currently on 40 units a day.  Continue metformin 1000 mg twice daily. Hemoglobin A1c today.  2.  Insomnia.  Mainly due to chronic nasal congestion/sinusitis. He is getting further evaluation with ENT with a CT of sinuses soon. He will continue mirtazapine 15 mg qd.   #3 recurrent major depressive disorder, in remission. Continue bupropion SR 150 mg twice a day and Lexapro 20 mg a day.  4.  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 Gradually progressing.  Patient has stopped driving. He is to follow-up with Dr. AVirgilio Bellingpatient their office number today.   #5 left knee sprain/strain. PCL versus biceps femoris tendon.   Discussed heat, range of motion, relative rest.  History of aortic root dilatation--plan repeat echo around July 2023. History of adenomatous colon polyps, was due for repeat as of March 2020"  INTERIM HX: Feeling well. Home glucoses show average fasting 90-110. 40 units of Levemir, 1000 mg metformin twice a day, and Ozempic 1 mg weekly. He is doing very well with his diet.  He would like to get off any medication that he possibly can. Today we reviewed his med list and he decided he wanted to wean off Lexapro.   Past Medical History:  Diagnosis Date   Allergic rhinitis, unspecified    Allergy testing via allergist 04/2017--mostly neg.  Dr. SDonneta Rombergrecommended referral to ENT so i did this.   [place] [place]   2019, 41 mm, 2022, 39 mm-->rpt 1 yr.   Back pain    LBP->DDD/spondylosis w/ intermittent radiulopathy-type leg  pains, + bilat hip and thigh pain-->spinal stenosis L4-5, L5-S1->Dr. SVertell Limberto do decomp and fusion surgery, scheduled for 12/03/20   BPH (benign prostatic hypertrophy)    no hydro on renal u/s 05/2020   Chronic nasal congestion    DDD (degenerative disc disease), lumbar 2010   laminectomy 01/2009.  MR rpt 05/2019->advanced DDD/spondylosis L3-S1, with mod/sev L4-5 spinal stenosis-->plan for surg per Dr. SVertell Limberas of 09/2020   Depression    Hospitalized for suicidal ideation 1992.  Has been on lexapro since 10/2008.   Diabetes mellitus type II    Dx'd approximately 06/2008.  No retinopathy as of 02/2017 ophth.   Erectile dysfunction    Heart murmur, systolic 116/1096  mild intensity, asymptomatic; echo 01/2018 valves fine->obs   History of colon polyps 01/2016   Recall 3 yrs   History of pneumonia 2008   Hospitalized   History of scarlet fever    age 71  Hyperlipidemia, mixed 1993   myalgias on pravastatin   Hypertension 2009   "marked chronic cardiomegaly" on CXR 01/2009 per old records.   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 ADelice Lesch  MRI brain 03/01/20 NORMAL   Nephrolithiasis    calcium oxalate   OSA on CPAP 2004;  2019   Back on CPAP 04/2018.  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  02/21/16: tubular adenoma.  Recall 3 yrs (Dr. Ardis Hughs).   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 Vertell Limber)   [place]     age 28   TRANSTHORACIC ECHOCARDIOGRAM  02/22/2018   EF 55-60%, grd I DD. Aortic root dilation 41 mm.  05/2021 echo unchanged except aortic root dilation down to 39 mm. Plan rpt 1 yr to monitor aortic root dilation.   URETHRAL DILATION     2 as a child, one as an adult   VASECTOMY  1994   VITRECTOMY Left     Outpatient Medications Prior to Visit  Medication Sig Dispense Refill   aspirin 81 MG EC tablet Take 1 tablet (81 mg total) by mouth daily. 30 tablet 12   azelastine (ASTELIN) 0.1 % nasal spray Place 2 sprays into both nostrils 2 (two) times daily. Use in each nostril as directed (Patient taking differently: Place 2 sprays into both nostrils daily as needed for rhinitis.) 30 mL 11   buPROPion (WELLBUTRIN SR) 150 MG 12 hr tablet TAKE 1 TABLET(150 MG) BY MOUTH TWICE DAILY 180 tablet 0   Choline Fenofibrate (FENOFIBRIC ACID) 135 MG CPDR Take 1 capsule by mouth daily. 90 capsule 3   finasteride (PROSCAR) 5 MG tablet TAKE 1 TABLET(5 MG) BY MOUTH DAILY 90 tablet 3   gabapentin (NEURONTIN) 300 MG capsule Take 3 capsules (900 mg total) by mouth at bedtime. 270 capsule 1   glucose blood (ONE TOUCH ULTRA TEST) test strip 1 each by Other route 3 (three) times daily. Test sugars up to three times daily. ( insurance preference  DX E11.65 E11.9) 100 each 11   insulin detemir (LEVEMIR FLEXTOUCH) 100 UNIT/ML FlexPen  Inject 54 Units into the skin every evening. (Patient taking differently: Inject 40 Units into the skin every evening.) 15 mL 6   Insulin Pen Needle (BD PEN NEEDLE NANO 2ND GEN) 32G X 4 MM MISC USE TO INJECT 4 TIMES DAILY 200 each 5   Krill Oil 500 MG CAPS Take 500 mg by mouth at bedtime.     metFORMIN (GLUCOPHAGE) 1000 MG tablet Take 1 tablet (1,000 mg total) by mouth 2 (two) times daily with a meal. 180 tablet 1   mirtazapine (REMERON) 15 MG tablet Take 1 tablet (15 mg total) by mouth at bedtime. 30 tablet 6  Semaglutide, 1 MG/DOSE, 4 MG/3ML SOPN Inject 1 mg as directed once a week. 3 mL 0   sodium chloride (OCEAN) 0.65 % nasal spray Place 1 spray into the nose daily as needed for congestion.     tadalafil (CIALIS) 5 MG tablet Take 1 tablet (5 mg total) by mouth every morning. 30 tablet 11   tamsulosin (FLOMAX) 0.4 MG CAPS capsule Take 1 capsule (0.4 mg total) by mouth at bedtime. 90 capsule 3   vitamin B-12 (CYANOCOBALAMIN) 1000 MCG tablet Take 2,000 mcg by mouth at bedtime.     atorvastatin (LIPITOR) 80 MG tablet Take 1 tablet by mouth once daily 90 tablet 0   escitalopram (LEXAPRO) 20 MG tablet Take 1 tablet (20 mg total) by mouth at bedtime. 90 tablet 1   No facility-administered medications prior to visit.    Allergies  Allergen Reactions   Simvastatin Other (See Comments)    myalgias   Antihistamines, Diphenhydramine-Type Other (See Comments)    depression   Codeine Nausea Only   Penicillins Hives   Sulfa Antibiotics Hives    ROS As per HPI  PE:    06/08/2022    1:16 PM 05/06/2022    3:00 PM 04/07/2022   10:00 AM  Vitals with BMI  Height '5\' 10"'$  '5\' 10"'$  '5\' 10"'$   Weight 243 lbs 3 oz 237 lbs 241 lbs  BMI 34.9 83.41 96.22  Systolic 297 989 211  Diastolic 71 64 70  Pulse 72 88 81    Physical Exam  Gen: Alert, well appearing.  Patient is oriented to person, place, time, and situation. AFFECT: pleasant, lucid thought and speech. No further exam today  LABS:  Last  CBC Lab Results  Component Value Date   WBC 9.3 09/30/2021   HGB 12.8 (L) 09/30/2021   HCT 37.8 (L) 09/30/2021   MCV 87.1 09/30/2021   MCH 29.5 09/30/2021   RDW 13.1 09/30/2021   PLT 303 94/17/4081   Last metabolic panel Lab Results  Component Value Date   GLUCOSE 88 03/03/2022   NA 139 03/03/2022   K 4.0 03/03/2022   CL 103 03/03/2022   CO2 27 03/03/2022   BUN 28 (H) 03/03/2022   CREATININE 1.05 03/03/2022   GFRNONAA 52 (L) 09/30/2021   CALCIUM 10.3 03/03/2022   PROT 6.9 12/09/2021   ALBUMIN 4.4 12/09/2021   LABGLOB 2.3 09/15/2021   AGRATIO 2.0 09/15/2021   BILITOT 0.5 12/09/2021   ALKPHOS 33 (L) 12/09/2021   AST 15 12/09/2021   ALT 21 12/09/2021   ANIONGAP 8 09/30/2021   Last lipids Lab Results  Component Value Date   CHOL 134 04/21/2021   HDL 46 04/21/2021   LDLCALC 69 04/21/2021   LDLDIRECT 112.0 07/31/2016   TRIG 101 04/21/2021   CHOLHDL 2.9 04/21/2021   Last hemoglobin A1c Lab Results  Component Value Date   HGBA1C 5.4 06/08/2022   HGBA1C 5.4 06/08/2022   HGBA1C 5.4 (A) 06/08/2022   HGBA1C 5.4 06/08/2022   Last thyroid functions Lab Results  Component Value Date   TSH 1.18 03/30/2022   T3TOTAL 108 02/14/2018   IMPRESSION AND PLAN:  #1 diabetes without complication. Point-of-care hemoglobin A1c today was 5.4%. Doing very well on current regime Levemir 40 units daily, Ozempic 1 mg weekly, and metformin 1000 mg twice a day. Urine microalbumin/creatinine today.  #2 history of hypertension. Weight loss has helped this. He maintains good blood pressure on no antihypertensives.  #3 hyperlipidemia, doing well on atorvastatin 80 mg a day  as well as 135 mg of fenofibric acid daily. Lipid panel and hepatic panel today  #4 polypharmacy. Reviewed medications together and due to his depression being in remission he would like to wean off Lexapro.  Advised to take half of his 20 mg Lexapro daily for 2 weeks and then half every other day for another 2  weeks before stopping He will continue on Wellbutrin SR 150 twice daily.  An After Visit Summary was printed and given to the patient.  FOLLOW UP: Return in about 3 months (around 09/08/2022) for routine chronic illness f/u.  Signed:  Crissie Sickles, MD           06/08/2022

## 2022-06-08 NOTE — Patient Instructions (Signed)
Take 1/2 of your escitalopram '20mg'$  tab every day for 2 weeks, then take 1/2 tab every other day for 2 wks then stop.

## 2022-06-09 ENCOUNTER — Encounter: Payer: Self-pay | Admitting: Family Medicine

## 2022-06-09 NOTE — Telephone Encounter (Signed)
Noted  

## 2022-06-10 ENCOUNTER — Encounter (INDEPENDENT_AMBULATORY_CARE_PROVIDER_SITE_OTHER): Payer: Self-pay | Admitting: Adult Health

## 2022-06-10 ENCOUNTER — Ambulatory Visit (INDEPENDENT_AMBULATORY_CARE_PROVIDER_SITE_OTHER): Payer: Medicare Other | Admitting: Adult Health

## 2022-06-10 ENCOUNTER — Ambulatory Visit (INDEPENDENT_AMBULATORY_CARE_PROVIDER_SITE_OTHER): Payer: Medicare Other | Admitting: Urology

## 2022-06-10 VITALS — BP 109/62 | HR 92 | Temp 98.0°F | Ht 70.0 in | Wt 234.0 lb

## 2022-06-10 VITALS — BP 127/71 | HR 101

## 2022-06-10 DIAGNOSIS — Z7984 Long term (current) use of oral hypoglycemic drugs: Secondary | ICD-10-CM | POA: Diagnosis not present

## 2022-06-10 DIAGNOSIS — E1169 Type 2 diabetes mellitus with other specified complication: Secondary | ICD-10-CM

## 2022-06-10 DIAGNOSIS — Z7985 Long-term (current) use of injectable non-insulin antidiabetic drugs: Secondary | ICD-10-CM

## 2022-06-10 DIAGNOSIS — N138 Other obstructive and reflux uropathy: Secondary | ICD-10-CM

## 2022-06-10 DIAGNOSIS — N201 Calculus of ureter: Secondary | ICD-10-CM

## 2022-06-10 DIAGNOSIS — N200 Calculus of kidney: Secondary | ICD-10-CM

## 2022-06-10 DIAGNOSIS — F5089 Other specified eating disorder: Secondary | ICD-10-CM | POA: Diagnosis not present

## 2022-06-10 DIAGNOSIS — F3289 Other specified depressive episodes: Secondary | ICD-10-CM

## 2022-06-10 DIAGNOSIS — E669 Obesity, unspecified: Secondary | ICD-10-CM | POA: Diagnosis not present

## 2022-06-10 DIAGNOSIS — R351 Nocturia: Secondary | ICD-10-CM

## 2022-06-10 DIAGNOSIS — Z794 Long term (current) use of insulin: Secondary | ICD-10-CM | POA: Diagnosis not present

## 2022-06-10 DIAGNOSIS — N401 Enlarged prostate with lower urinary tract symptoms: Secondary | ICD-10-CM | POA: Diagnosis not present

## 2022-06-10 DIAGNOSIS — E66811 Obesity, class 1: Secondary | ICD-10-CM

## 2022-06-10 DIAGNOSIS — Z6833 Body mass index (BMI) 33.0-33.9, adult: Secondary | ICD-10-CM

## 2022-06-10 LAB — URINALYSIS, ROUTINE W REFLEX MICROSCOPIC
Bilirubin, UA: NEGATIVE
Glucose, UA: NEGATIVE
Ketones, UA: NEGATIVE
Leukocytes,UA: NEGATIVE
Nitrite, UA: NEGATIVE
Protein,UA: NEGATIVE
RBC, UA: NEGATIVE
Specific Gravity, UA: 1.015 (ref 1.005–1.030)
Urobilinogen, Ur: 0.2 mg/dL (ref 0.2–1.0)
pH, UA: 6 (ref 5.0–7.5)

## 2022-06-10 MED ORDER — SEMAGLUTIDE (1 MG/DOSE) 4 MG/3ML ~~LOC~~ SOPN: 1.0000 mg | PEN_INJECTOR | SUBCUTANEOUS | 0 refills | Status: DC

## 2022-06-10 MED ORDER — TADALAFIL 5 MG PO TABS: 5.0000 mg | ORAL_TABLET | Freq: Every morning | ORAL | 11 refills | Status: DC

## 2022-06-10 MED ORDER — BUPROPION HCL ER (SR) 150 MG PO TB12: ORAL_TABLET | ORAL | 0 refills | Status: DC

## 2022-06-10 MED ORDER — ALFUZOSIN HCL ER 10 MG PO TB24: 10.0000 mg | ORAL_TABLET | Freq: Every day | ORAL | 11 refills | Status: DC

## 2022-06-10 NOTE — Progress Notes (Signed)
06/10/2022 2:04 PM   [name] [name] 1966-04-30 [id number]  Referring provider: Tammi Sou, MD [address] [place],  [place] [address]  Followup nephrolithiasis   HPI: Mr [name] [name] is a 71OY here for followup for nephrolithiasis and erectile dysfunction. IPSS 14 QOL 4 on flomax 0.'4mg'$  daily, finasteride, and tadalafil '5mg'$  daily. He has nocturia 3-4x. His erections are unimproved with tadalafil '5mg'$  daily.    PMH: Past Medical History:  Diagnosis Date   Allergic rhinitis, unspecified    Allergy testing via allergist 04/2017--mostly neg.  Dr. Donneta Romberg recommended referral to ENT so i did this.   Aortic root dilatation ([place]) 2019   2019, 41 mm, 2022, 39 mm-->rpt 1 yr.   Back pain    LBP->DDD/spondylosis w/ intermittent radiulopathy-type leg pains, + bilat hip and thigh pain-->spinal stenosis L4-5, L5-S1->Dr. Vertell Limber to do decomp and fusion surgery, scheduled for 12/03/20   BPH (benign prostatic hypertrophy)    no hydro on renal u/s 05/2020   Chronic nasal congestion    DDD (degenerative disc disease), lumbar 2010   laminectomy 01/2009.  MR rpt 05/2019->advanced DDD/spondylosis L3-S1, with mod/sev L4-5 spinal stenosis-->plan for surg per Dr. Vertell Limber as of 09/2020   Depression    Hospitalized for suicidal ideation 1992.  Has been on lexapro since 71/2009.   Diabetes mellitus type II    Dx'd approximately 06/2008.  No retinopathy as of 02/2017 ophth.   Erectile dysfunction    Heart murmur, systolic 77/4128   mild intensity, asymptomatic; echo 01/2018 valves fine->obs   History of colon polyps 01/2016   Recall 3 yrs   History of pneumonia 2008   Hospitalized   History of scarlet fever    age 71   Hyperlipidemia, mixed 1993   myalgias on pravastatin   Hypertension 2009   "marked chronic cardiomegaly" on CXR 01/2009 per old records.   Hypogonadism, male 01/11/2012   Axiron trial started 03/2012   Insomnia    Keratoconus    dx'd 1973   Kidney stones    Memory changes     Mild cognitive impairment with memory loss 01/2020 eval with Dr. Delice Lesch   MRI brain 03/01/20 NORMAL   Nephrolithiasis    calcium oxalate   OSA on CPAP 2004; 2019   Back on CPAP 04/2018.  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  02/21/16: tubular adenoma.  Recall 3 yrs (Dr. Ardis Hughs).   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 Vertell Limber)   [place]     age 71   TRANSTHORACIC ECHOCARDIOGRAM  02/22/2018   EF 55-60%, grd I DD. Aortic root dilation 41 mm.  05/2021 echo unchanged except aortic root dilation down to 39 mm. Plan rpt 1 yr to monitor aortic root dilation.   URETHRAL DILATION     2 as a child, one as an adult   VASECTOMY  1994   VITRECTOMY Left     Home Medications:  Allergies as of 06/10/2022       Reactions   Simvastatin Other (See Comments)   myalgias   Antihistamines, Diphenhydramine-type Other (See Comments)   depression   Codeine Nausea Only   Penicillins  Hives   Sulfa Antibiotics Hives        Medication List        Accurate as of June 10, 2022  2:04 PM. If you have any questions, ask your nurse or doctor.          aspirin EC 81 MG tablet Take 1 tablet (81 mg total) by mouth daily.   atorvastatin 80 MG tablet Commonly known as: LIPITOR Take 1 tablet (80 mg total) by mouth daily.   azelastine 0.1 % nasal spray Commonly known as: ASTELIN Place 2 sprays into both nostrils 2 (two) times daily. Use in each nostril as directed   BD Pen Needle Nano 2nd Gen 32G X 4 MM Misc Generic drug: Insulin Pen Needle USE TO INJECT 4 TIMES DAILY   buPROPion 150 MG 12 hr tablet Commonly known as: WELLBUTRIN SR TAKE 1 TABLET(150 MG) BY MOUTH TWICE DAILY   Fenofibric Acid 135 MG Cpdr Take 1 capsule by mouth daily.   finasteride 5 MG tablet Commonly known as: PROSCAR TAKE 1 TABLET(5 MG) BY MOUTH DAILY   gabapentin 300 MG capsule Commonly known as: NEURONTIN Take 3 capsules (900 mg total) by mouth at bedtime.   glucose blood test strip Commonly known as: ONE TOUCH ULTRA TEST 1 each by Other route 3 (three) times daily. Test sugars up to three times daily. ( insurance preference  DX E11.65 E11.9)   Krill Oil 500 MG Caps Take 500 mg by mouth at bedtime.   Levemir FlexTouch 100 UNIT/ML FlexPen Generic drug: insulin detemir Inject 54 Units into the skin every evening. What changed: how much to take   metFORMIN 1000 MG tablet Commonly known as: GLUCOPHAGE Take 1 tablet (1,000 mg total) by mouth 2 (two) times daily with a meal.   mirtazapine 15 MG tablet Commonly known as: REMERON Take 1 tablet (15 mg total) by mouth at bedtime.   Semaglutide (1 MG/DOSE) 4 MG/3ML Sopn Inject 1 mg as directed once a week.   sodium chloride 0.65 % nasal spray Commonly known as: OCEAN Place 1 spray into the nose daily as needed for congestion.   tadalafil 5 MG tablet Commonly known as: CIALIS Take 1 tablet (5 mg total) by mouth every morning.    tamsulosin 0.4 MG Caps capsule Commonly known as: FLOMAX Take 1 capsule (0.4 mg total) by mouth at bedtime.   vitamin B-12 1000 MCG tablet Commonly known as: CYANOCOBALAMIN Take 2,000 mcg by mouth at bedtime.        Allergies:  Allergies  Allergen Reactions   Simvastatin Other (See Comments)    myalgias   Antihistamines, Diphenhydramine-Type Other (See Comments)    depression   Codeine Nausea Only   Penicillins Hives   Sulfa  Antibiotics Hives    Family History: Family History  Problem Relation Age of Onset   Heart disease Mother    Hyperlipidemia Mother    Stroke Mother    Diabetes Mother    Depression Mother    Alzheimer's disease Mother    Stroke Father    Hyperlipidemia Father    Heart disease Father    Diabetes Father    Hypertension Father    Obesity Father    Alcohol abuse Sister    Depression Sister    Sleep apnea Neg Hx     Social History:  reports that he quit smoking about 40 years ago. His smoking use included cigars. He has never used smokeless tobacco. He reports current alcohol use. He reports that he does not use drugs.  ROS: All other review of systems were reviewed and are negative except what is noted above in HPI  Physical Exam: BP 127/71   Pulse (!) 101   Constitutional:  Alert and oriented, No acute distress. HEENT: [place] AT, moist mucus membranes.  Trachea midline, no masses. Cardiovascular: No clubbing, cyanosis, or edema. Respiratory: Normal respiratory effort, no increased work of breathing. GI: Abdomen is soft, nontender, nondistended, no abdominal masses GU: No CVA tenderness.  Lymph: No cervical or inguinal lymphadenopathy. Skin: No rashes, bruises or suspicious lesions. Neurologic: Grossly intact, no focal deficits, moving all 4 extremities. Psychiatric: Normal mood and affect.  Laboratory Data: Lab Results  Component Value Date   WBC 9.3 09/30/2021   HGB 12.8 (L) 09/30/2021   HCT 37.8 (L) 09/30/2021   MCV 87.1  09/30/2021   PLT 303 09/30/2021    Lab Results  Component Value Date   CREATININE 0.90 06/08/2022    Lab Results  Component Value Date   PSA 0.02 (L) 05/22/2021   PSA 0.08 (L) 05/16/2020   PSA 0.04 (L) 05/19/2019    Lab Results  Component Value Date   TESTOSTERONE 87.17 (L) 04/13/2012    Lab Results  Component Value Date   HGBA1C 5.4 06/08/2022   HGBA1C 5.4 06/08/2022   HGBA1C 5.4 (A) 06/08/2022   HGBA1C 5.4 06/08/2022    Urinalysis    Component Value Date/Time   COLORURINE YELLOW 09/30/2021 1102   APPEARANCEUR Clear 11/19/2021 1340   LABSPEC 1.004 (L) 09/30/2021 1102   PHURINE 6.0 09/30/2021 1102   GLUCOSEU Negative 11/19/2021 1340   HGBUR LARGE (A) 09/30/2021 1102   BILIRUBINUR Negative 11/19/2021 1340   KETONESUR NEGATIVE 09/30/2021 1102   PROTEINUR Negative 11/19/2021 1340   PROTEINUR NEGATIVE 09/30/2021 1102   NITRITE Negative 11/19/2021 1340   NITRITE NEGATIVE 09/30/2021 1102   LEUKOCYTESUR Negative 11/19/2021 1340   LEUKOCYTESUR TRACE (A) 09/30/2021 1102    Lab Results  Component Value Date   LABMICR Comment 11/19/2021   WBCUA None seen 10/08/2021   LABEPIT 0-10 10/08/2021   MUCUS Present 10/08/2021   BACTERIA Few (A) 10/08/2021    Pertinent Imaging: Renal US 06/05/2022: Images reviewed and discussed with the patient  No results found for this or any previous visit.  No results found for this or any previous visit.  No results found for this or any previous visit.  No results found for this or any previous visit.  Results for orders placed during the hospital encounter of 06/05/22  Ultrasound renal complete  Narrative CLINICAL DATA:  Nephrolithiasis follow-up  EXAM: RENAL / URINARY TRACT ULTRASOUND COMPLETE  COMPARISON:  Renal ultrasound 11/14/2021  FINDINGS: Right Kidney:  Renal measurements: 12.7 x 6.3 x  5.0 cm = volume: 231 mL. Echogenicity within normal limits. No mass or hydronephrosis visualized. No shadowing echogenic  foci identified.  Left Kidney:  Renal measurements: 13.4 x 7.2 x 5.4 cm = volume: 272 mL. Echogenicity within normal limits. No mass or hydronephrosis visualized. No shadowing echogenic foci identified.  Bladder:  Appears normal for degree of bladder distention.  Other:  None.  IMPRESSION: No sonographic evidence of renal calculi or hydronephrosis.   Electronically Signed By: Audie Pinto M.D. On: 06/05/2022 12:06  No results found for this or any previous visit.  No results found for this or any previous visit.  Results for orders placed during the hospital encounter of 09/30/21  CT Renal Stone Study  Narrative CLINICAL DATA:  Bilateral flank pain, recent lithotripsy  EXAM: CT ABDOMEN AND PELVIS WITHOUT CONTRAST  TECHNIQUE: Multidetector CT imaging of the abdomen and pelvis was performed following the standard protocol without IV contrast.  COMPARISON:  09/25/2021  FINDINGS: Lower chest: Small linear densities in the lower lung fields may suggest scarring or minimal subsegmental atelectasis. 4 mm nodule seen in the right lower lobe in the previous examination appears to be obscured by small linear subsegmental atelectasis. There are scattered coronary artery calcifications.  Hepatobiliary: No focal abnormality is seen in liver. Liver measures 18.2 cm in length. There are multiple calcified gallbladder stones. There is no dilation of bile ducts.  Pancreas: No focal abnormality is seen.  Spleen: Unremarkable.  Adrenals/Urinary Tract: Adrenals are not enlarged. There is no hydronephrosis. There is interval passage or removal of right ureteral calculus. There is interval placement of left ureteral stent. Calculus seen in the distal course of left ureter in the previous study is not seen in the current study. In image 59, small pocket of air is seen in the distal left ureter which may be related to instrumentation. There is mild stranding adjacent to  the renal pelvis and ureters on both sides, possibly due to recent ureteric obstruction and instrumentation. Possibility pyelonephritis is not excluded. There are no demonstrable renal stones. Urinary bladder is not distended. Small pockets of air in the urinary bladder most likely related to recent instrumentation.  Stomach/Bowel: Stomach is unremarkable. Small bowel loops are not dilated. Appendix is not distinctly seen. There is no focal pericecal inflammation. There is no significant wall thickening in colon. Few diverticula are seen.  Vascular/Lymphatic: There are scattered arterial calcifications. No significant lymphadenopathy is seen.  Reproductive: Prostate appears smaller than usual in size.  Other: There is no ascites or pneumoperitoneum.  Musculoskeletal: There is previous surgical fusion from L4-S1 levels.  IMPRESSION: There is interval passage or removal of bilateral ureteral stones. There is interval decrease in bilateral hydronephrosis. Left ureteral stent is noted. There is stranding in the fat planes adjacent to renal pelves and ureters on both sides which may be related to recent passage of ureteral calculi and instrumentation. Possibility of pyelonephritis is not excluded.  There is no evidence of intestinal obstruction or pneumoperitoneum. Gallbladder stones. There is no dilation of bile ducts. Coronary artery calcifications are seen. Diverticulosis of colon.  Other findings as described in the body of the report.   Electronically Signed By: Elmer Picker M.D. On: 09/30/2021 12:11   Assessment & Plan:    1. Nephrolithiasis RTC 6 months with renal US - Urinalysis, Routine w reflex microscopic  2. BPh with nocturia -Uroxatral '10mg'$  qhs and continue finasteride   No follow-ups on file.  Nicolette Bang, MD  [facility] [place]

## 2022-06-10 NOTE — Progress Notes (Unsigned)
Chief Complaint:   OBESITY [name] [name] is here to discuss his progress with his obesity treatment plan along with follow-up of his obesity related diagnoses. Tarus is on the Category 4 Plan and states he is following his eating plan approximately 50% of the time. [name] states he is not currently exercising.  Today's visit was #: 49 Starting weight: 273 lbs Starting date: 02/14/2018 Today's weight: 234 lbs Today's date: 06/10/2022 Total lbs lost to date: 39 Total lbs lost since last in-office visit: -3  Interim History:  Typical day of intake: Breakfast-3 eggs with [place] sausage, 1 English muffin with peanut butter or [place] waffles. Lunch-dinner leftovers or ham and cheese sandwich. Dinner-protein with vegetables-not using food scale to measure protein content after cooking.  Subjective:   1. Type 2 diabetes mellitus with other specified complication, with long-term current use of insulin (HCC) Fasting blood glucose 100-110, he denies symptoms of hypoglycemia. He is currently on Metformin 1000 mg twice daily, Ozempic 1 mg (Monday injection), nightly Levemir 40 units. PCP manages Metformin and Levemir therapy. [facility] manages Ozempic therapy.  2. Other depression, with emotional eating  He is slowly weaning Lexapro 20 mg-denies suicidal/homicidal ideation. 06/08/2022 PCP instructed on weaning plan. " Advised to take half of his 20 mg Lexapro daily for 2 weeks and then half every other day for another 2 weeks before stopping". Continued on bupropion SR 150 mg twice daily.  Assessment/Plan:   1. Type 2 diabetes mellitus with other specified complication, with long-term current use of insulin (HCC) Refill - Semaglutide, 1 MG/DOSE, 4 MG/3ML SOPN; Inject 1 mg as directed once a week.  Dispense: 3 mL; Refill: 0  2. Other depression, with emotional eating  Refill - buPROPion (WELLBUTRIN SR) 150 MG 12 hr tablet; TAKE 1 TABLET(150 MG) BY MOUTH TWICE DAILY  Dispense: 60 tablet; Refill:  0  3. Obesity, current BMI 33.6 1. Use food scale to measure protein servings.  Rishi is currently in the action stage of change. As such, his goal is to continue with weight loss efforts. He has agreed to the Category 4 Plan.   Exercise goals: Older adults should follow the adult guidelines. When older adults cannot meet the adult guidelines, they should be as physically active as their abilities and conditions will allow.  Water aerobics.  Behavioral modification strategies: increasing lean protein intake, decreasing simple carbohydrates, meal planning and cooking strategies, keeping healthy foods in the home, and planning for success.  [name] [name] has agreed to follow-up with our clinic in 5 weeks. He was informed of the importance of frequent follow-up visits to maximize his success with intensive lifestyle modifications for his multiple health conditions.    Objective:   Blood pressure 109/62, pulse 92, temperature 98 F (36.7 C), height '5\' 10"'$  (1.778 m), weight 234 lb (106.1 kg), SpO2 96 %. Body mass index is 33.58 kg/m.  General: Cooperative, alert, well developed, in no acute distress. HEENT: Conjunctivae and lids unremarkable. Cardiovascular: Regular rhythm.  Lungs: Normal work of breathing. Neurologic: No focal deficits.   Lab Results  Component Value Date   CREATININE 0.90 06/08/2022   BUN 19 06/08/2022   NA 138 06/08/2022   K 4.0 06/08/2022   CL 104 06/08/2022   CO2 26 06/08/2022   Lab Results  Component Value Date   ALT 14 06/08/2022   AST 12 06/08/2022   ALKPHOS 32 (L) 06/08/2022   BILITOT 0.4 06/08/2022   Lab Results  Component Value Date   HGBA1C 5.4  06/08/2022   HGBA1C 5.4 06/08/2022   HGBA1C 5.4 (A) 06/08/2022   HGBA1C 5.4 06/08/2022   HGBA1C 6.8 (H) 03/03/2022   Lab Results  Component Value Date   INSULIN 3.2 09/15/2021   INSULIN 5.8 08/29/2020   INSULIN 5.3 05/09/2020   Lab Results  Component Value Date   TSH 1.18 03/30/2022   Lab Results   Component Value Date   CHOL 129 06/08/2022   HDL 47.40 06/08/2022   LDLCALC 64 06/08/2022   LDLDIRECT 112.0 07/31/2016   TRIG 90.0 06/08/2022   CHOLHDL 3 06/08/2022   Lab Results  Component Value Date   VD25OH 98.8 09/15/2021   VD25OH 42.0 04/21/2021   VD25OH 59.0 01/28/2021   Lab Results  Component Value Date   WBC 9.3 09/30/2021   HGB 12.8 (L) 09/30/2021   HCT 37.8 (L) 09/30/2021   MCV 87.1 09/30/2021   PLT 303 09/30/2021   No results found for: "IRON", "TIBC", "FERRITIN"  Obesity Behavioral Intervention:   Approximately 15 minutes were spent on the discussion below.  ASK: We discussed the diagnosis of obesity with [name] today and Donnavan agreed to give [name] permission to discuss obesity behavioral modification therapy today.  ASSESS: Dontai has the diagnosis of obesity and his BMI today is 33.6. Liborio is in the action stage of change.   ADVISE: Jaxon was educated on the multiple health risks of obesity as well as the benefit of weight loss to improve his health. He was advised of the need for long term treatment and the importance of lifestyle modifications to improve his current health and to decrease his risk of future health problems.  AGREE: Multiple dietary modification options and treatment options were discussed and Troy agreed to follow the recommendations documented in the above note.  ARRANGE: Brennden was educated on the importance of frequent visits to treat obesity as outlined per CMS and USPSTF guidelines and agreed to schedule his next follow up appointment today.  Attestation Statements:   Reviewed by clinician on day of visit: allergies, medications, problem list, medical history, surgical history, family history, social history, and previous encounter notes.  I, Georgianne Fick, FNP, am acting as Location manager for Mina Marble, NP.  I have reviewed the above documentation for accuracy and completeness, and I agree with the above. -  Katy d.  Danford,NP-C

## 2022-06-16 ENCOUNTER — Other Ambulatory Visit (INDEPENDENT_AMBULATORY_CARE_PROVIDER_SITE_OTHER): Payer: Self-pay | Admitting: Adult Health

## 2022-06-16 DIAGNOSIS — Z794 Long term (current) use of insulin: Secondary | ICD-10-CM

## 2022-06-18 ENCOUNTER — Encounter: Payer: Self-pay | Admitting: Urology

## 2022-06-18 NOTE — Patient Instructions (Signed)

## 2022-06-23 ENCOUNTER — Encounter: Payer: Self-pay | Admitting: Family Medicine

## 2022-06-23 DIAGNOSIS — E1169 Type 2 diabetes mellitus with other specified complication: Secondary | ICD-10-CM

## 2022-06-23 MED ORDER — SEMAGLUTIDE (1 MG/DOSE) 4 MG/3ML ~~LOC~~ SOPN: 1.0000 mg | PEN_INJECTOR | SUBCUTANEOUS | 2 refills | Status: DC

## 2022-06-23 NOTE — Telephone Encounter (Signed)
RF request for Ozempic LOV: 06/08/22 Next ov: 09/09/22 Last written: 06/10/22 (62m,0) KMina Marble NP  Please review and advise. Medication pending

## 2022-06-23 NOTE — Telephone Encounter (Signed)
Okay, Ozempic 1 mg weekly --prescription sent.

## 2022-07-06 ENCOUNTER — Other Ambulatory Visit: Payer: Self-pay

## 2022-07-06 ENCOUNTER — Encounter (HOSPITAL_BASED_OUTPATIENT_CLINIC_OR_DEPARTMENT_OTHER): Payer: Self-pay | Admitting: Otolaryngology

## 2022-07-08 ENCOUNTER — Encounter (HOSPITAL_BASED_OUTPATIENT_CLINIC_OR_DEPARTMENT_OTHER)
Admission: RE | Admit: 2022-07-08 | Discharge: 2022-07-08 | Disposition: A | Discharge status: Home / Self Care | Payer: Medicare Other | Source: Ambulatory Visit | Attending: Otolaryngology | Admitting: Otolaryngology

## 2022-07-08 ENCOUNTER — Encounter (INDEPENDENT_AMBULATORY_CARE_PROVIDER_SITE_OTHER): Payer: Self-pay

## 2022-07-08 DIAGNOSIS — Z01812 Encounter for preprocedural laboratory examination: Secondary | ICD-10-CM | POA: Insufficient documentation

## 2022-07-08 LAB — BASIC METABOLIC PANEL
Anion gap: 10 (ref 5–15)
BUN: 20 mg/dL (ref 8–23)
CO2: 24 mmol/L (ref 22–32)
Calcium: 9.8 mg/dL (ref 8.9–10.3)
Chloride: 105 mmol/L (ref 98–111)
Creatinine, Ser: 0.98 mg/dL (ref 0.61–1.24)
GFR, Estimated: >60 mL/min (ref >60)
Glucose, Bld: 134 mg/dL — ABNORMAL HIGH (ref 70–99)
Potassium: 4.1 mmol/L (ref 3.5–5.1)
Sodium: 139 mmol/L (ref 135–145)

## 2022-07-13 ENCOUNTER — Ambulatory Visit (HOSPITAL_BASED_OUTPATIENT_CLINIC_OR_DEPARTMENT_OTHER): Payer: Medicare Other | Admitting: Anesthesiology

## 2022-07-13 ENCOUNTER — Ambulatory Visit (HOSPITAL_BASED_OUTPATIENT_CLINIC_OR_DEPARTMENT_OTHER)
Admission: RE | Admit: 2022-07-13 | Discharge: 2022-07-13 | Disposition: A | Discharge status: Home / Self Care | Payer: Medicare Other | Attending: Otolaryngology | Admitting: Otolaryngology

## 2022-07-13 ENCOUNTER — Encounter (HOSPITAL_BASED_OUTPATIENT_CLINIC_OR_DEPARTMENT_OTHER)
Admission: RE | Disposition: A | Discharge status: Home / Self Care | Payer: Self-pay | Source: Home / Self Care | Attending: Otolaryngology

## 2022-07-13 ENCOUNTER — Other Ambulatory Visit: Payer: Self-pay

## 2022-07-13 ENCOUNTER — Encounter (HOSPITAL_BASED_OUTPATIENT_CLINIC_OR_DEPARTMENT_OTHER): Payer: Self-pay | Admitting: Otolaryngology

## 2022-07-13 DIAGNOSIS — J338 Other polyp of sinus: Secondary | ICD-10-CM | POA: Diagnosis not present

## 2022-07-13 DIAGNOSIS — J3489 Other specified disorders of nose and nasal sinuses: Secondary | ICD-10-CM

## 2022-07-13 DIAGNOSIS — J343 Hypertrophy of nasal turbinates: Secondary | ICD-10-CM

## 2022-07-13 DIAGNOSIS — J322 Chronic ethmoidal sinusitis: Secondary | ICD-10-CM | POA: Diagnosis not present

## 2022-07-13 DIAGNOSIS — E1165 Type 2 diabetes mellitus with hyperglycemia: Secondary | ICD-10-CM

## 2022-07-13 DIAGNOSIS — J329 Chronic sinusitis, unspecified: Secondary | ICD-10-CM | POA: Diagnosis not present

## 2022-07-13 DIAGNOSIS — E119 Type 2 diabetes mellitus without complications: Secondary | ICD-10-CM | POA: Diagnosis not present

## 2022-07-13 DIAGNOSIS — Z7984 Long term (current) use of oral hypoglycemic drugs: Secondary | ICD-10-CM | POA: Insufficient documentation

## 2022-07-13 DIAGNOSIS — Z87891 Personal history of nicotine dependence: Secondary | ICD-10-CM | POA: Diagnosis not present

## 2022-07-13 DIAGNOSIS — J320 Chronic maxillary sinusitis: Secondary | ICD-10-CM | POA: Insufficient documentation

## 2022-07-13 DIAGNOSIS — J342 Deviated nasal septum: Secondary | ICD-10-CM | POA: Insufficient documentation

## 2022-07-13 DIAGNOSIS — J328 Other chronic sinusitis: Secondary | ICD-10-CM

## 2022-07-13 DIAGNOSIS — G4730 Sleep apnea, unspecified: Secondary | ICD-10-CM | POA: Diagnosis not present

## 2022-07-13 DIAGNOSIS — Z794 Long term (current) use of insulin: Secondary | ICD-10-CM | POA: Insufficient documentation

## 2022-07-13 HISTORY — PX: NASAL SEPTOPLASTY W/ TURBINOPLASTY: SHX2070

## 2022-07-13 HISTORY — PX: MAXILLARY ANTROSTOMY: SHX2003

## 2022-07-13 HISTORY — PX: SINUS ENDO W/FUSION: SHX777

## 2022-07-13 LAB — GLUCOSE, CAPILLARY
Glucose-Capillary: 106 mg/dL — ABNORMAL HIGH (ref 70–99)
Glucose-Capillary: 122 mg/dL — ABNORMAL HIGH (ref 70–99)

## 2022-07-13 SURGERY — SEPTOPLASTY, NOSE, WITH NASAL TURBINATE REDUCTION
Anesthesia: General | Site: Nose

## 2022-07-13 MED ORDER — ACETAMINOPHEN 10 MG/ML IV SOLN
1000.0000 mg | Freq: Once | INTRAVENOUS | Status: DC | PRN
  Administered 2022-07-13: 1000 mg via INTRAVENOUS

## 2022-07-13 MED ORDER — LIDOCAINE-EPINEPHRINE 1 %-1:100000 IJ SOLN
INTRAMUSCULAR | Status: DC | PRN
  Administered 2022-07-13: 4 mL

## 2022-07-13 MED ORDER — LIDOCAINE HCL (CARDIAC) PF 100 MG/5ML IV SOSY
PREFILLED_SYRINGE | INTRAVENOUS | Status: DC | PRN
  Administered 2022-07-13: 80 mg via INTRAVENOUS

## 2022-07-13 MED ORDER — SUGAMMADEX SODIUM 500 MG/5ML IV SOLN
INTRAVENOUS | Status: DC | PRN
  Administered 2022-07-13: 400 mg via INTRAVENOUS

## 2022-07-13 MED ORDER — LIDOCAINE 2% (20 MG/ML) 5 ML SYRINGE
INTRAMUSCULAR | Status: AC
  Filled 2022-07-13: qty 5

## 2022-07-13 MED ORDER — FENTANYL CITRATE (PF) 100 MCG/2ML IJ SOLN: 25.0000 ug | INTRAMUSCULAR | Status: DC | PRN

## 2022-07-13 MED ORDER — CLINDAMYCIN PHOSPHATE 900 MG/50ML IV SOLN
INTRAVENOUS | Status: DC | PRN
  Administered 2022-07-13: 900 mg via INTRAVENOUS

## 2022-07-13 MED ORDER — ONDANSETRON HCL 4 MG/2ML IJ SOLN
INTRAMUSCULAR | Status: AC
Start: 2022-07-13 — End: ?
  Filled 2022-07-13: qty 2

## 2022-07-13 MED ORDER — OXYCODONE-ACETAMINOPHEN 5-325 MG PO TABS: 1.0000 | ORAL_TABLET | ORAL | 0 refills | Status: AC | PRN

## 2022-07-13 MED ORDER — MUPIROCIN 2 % EX OINT
TOPICAL_OINTMENT | CUTANEOUS | Status: DC | PRN
  Administered 2022-07-13: 1 via NASAL

## 2022-07-13 MED ORDER — CLINDAMYCIN PHOSPHATE 900 MG/50ML IV SOLN
INTRAVENOUS | Status: AC
  Filled 2022-07-13: qty 50

## 2022-07-13 MED ORDER — ACETAMINOPHEN 10 MG/ML IV SOLN
INTRAVENOUS | Status: AC
  Filled 2022-07-13: qty 100

## 2022-07-13 MED ORDER — PROPOFOL 10 MG/ML IV BOLUS
INTRAVENOUS | Status: DC | PRN
  Administered 2022-07-13: 200 mg via INTRAVENOUS

## 2022-07-13 MED ORDER — DEXAMETHASONE SODIUM PHOSPHATE 10 MG/ML IJ SOLN
INTRAMUSCULAR | Status: AC
Start: 2022-07-13 — End: ?
  Filled 2022-07-13: qty 1

## 2022-07-13 MED ORDER — ROCURONIUM BROMIDE 10 MG/ML (PF) SYRINGE
PREFILLED_SYRINGE | INTRAVENOUS | Status: AC
Start: 2022-07-13 — End: ?
  Filled 2022-07-13: qty 10

## 2022-07-13 MED ORDER — ONDANSETRON HCL 4 MG/2ML IJ SOLN
INTRAMUSCULAR | Status: DC | PRN
  Administered 2022-07-13: 4 mg via INTRAVENOUS

## 2022-07-13 MED ORDER — LIDOCAINE-EPINEPHRINE 1 %-1:100000 IJ SOLN
INTRAMUSCULAR | Status: AC
  Filled 2022-07-13: qty 1

## 2022-07-13 MED ORDER — PROPOFOL 10 MG/ML IV BOLUS
INTRAVENOUS | Status: AC
  Filled 2022-07-13: qty 20

## 2022-07-13 MED ORDER — CLINDAMYCIN HCL 300 MG PO CAPS
300.0000 mg | ORAL_CAPSULE | Freq: Three times a day (TID) | ORAL | 0 refills | Status: AC
Start: 2022-07-13 — End: 2022-07-16

## 2022-07-13 MED ORDER — EPHEDRINE SULFATE (PRESSORS) 50 MG/ML IJ SOLN
INTRAMUSCULAR | Status: DC | PRN
  Administered 2022-07-13: 10 mg via INTRAVENOUS

## 2022-07-13 MED ORDER — FENTANYL CITRATE (PF) 100 MCG/2ML IJ SOLN
INTRAMUSCULAR | Status: AC
  Filled 2022-07-13: qty 2

## 2022-07-13 MED ORDER — LACTATED RINGERS IV SOLN: INTRAVENOUS | Status: DC

## 2022-07-13 MED ORDER — PHENYLEPHRINE HCL (PRESSORS) 10 MG/ML IV SOLN
INTRAVENOUS | Status: DC | PRN
  Administered 2022-07-13 (×3): 160 ug via INTRAVENOUS
  Administered 2022-07-13 (×2): 80 ug via INTRAVENOUS

## 2022-07-13 MED ORDER — FENTANYL CITRATE (PF) 100 MCG/2ML IJ SOLN
INTRAMUSCULAR | Status: DC | PRN
Start: 2022-07-13 — End: 2022-07-13
  Administered 2022-07-13: 100 ug via INTRAVENOUS

## 2022-07-13 MED ORDER — ROCURONIUM BROMIDE 100 MG/10ML IV SOLN
INTRAVENOUS | Status: DC | PRN
  Administered 2022-07-13: 60 mg via INTRAVENOUS
  Administered 2022-07-13: 30 mg via INTRAVENOUS

## 2022-07-13 MED ORDER — DEXAMETHASONE SODIUM PHOSPHATE 4 MG/ML IJ SOLN
INTRAMUSCULAR | Status: DC | PRN
  Administered 2022-07-13: 10 mg via INTRAVENOUS

## 2022-07-13 SURGICAL SUPPLY — 49 items
BLADE RAD40 ROTATE 4M 4 5PK (BLADE) IMPLANT
BLADE RAD60 ROTATE M4 4 5PK (BLADE) IMPLANT
BLADE ROTATE RAD 12 4 M4 (BLADE) IMPLANT
BLADE ROTATE RAD 40 4 M4 (BLADE) IMPLANT
BLADE ROTATE TRICUT 4X13 M4 (BLADE) ×4 IMPLANT
BLADE TRICUT ROTATE M4 4 5PK (BLADE) IMPLANT
BUR HS RAD FRONTAL 3 (BURR) IMPLANT
CANISTER SUC SOCK COL 7IN (MISCELLANEOUS) ×4 IMPLANT
CANISTER SUCT 1200ML W/VALVE (MISCELLANEOUS) ×8 IMPLANT
COAGULATOR SUCT 8FR VV (MISCELLANEOUS) ×2 IMPLANT
DEFOGGER MIRROR 1QT (MISCELLANEOUS) ×4 IMPLANT
DRSG NASAL KENNEDY LMNT 8CM (GAUZE/BANDAGES/DRESSINGS) IMPLANT
DRSG NASOPORE 8CM (GAUZE/BANDAGES/DRESSINGS) IMPLANT
DRSG TELFA 3X8 NADH (GAUZE/BANDAGES/DRESSINGS) IMPLANT
ELECT REM PT RETURN 9FT ADLT (ELECTROSURGICAL) ×4
ELECTRODE REM PT RTRN 9FT ADLT (ELECTROSURGICAL) ×3 IMPLANT
GLOVE BIO SURGEON STRL SZ7.5 (GLOVE) ×4 IMPLANT
GOWN STRL REUS W/ TWL LRG LVL3 (GOWN DISPOSABLE) ×6 IMPLANT
GOWN STRL REUS W/TWL LRG LVL3 (GOWN DISPOSABLE) ×8
HEMOSTAT SURGICEL 2X14 (HEMOSTASIS) IMPLANT
IV NS 500ML (IV SOLUTION) ×4
IV NS 500ML BAXH (IV SOLUTION) ×3 IMPLANT
NDL HYPO 25X1 1.5 SAFETY (NEEDLE) ×2 IMPLANT
NDL SPNL 25GX3.5 QUINCKE BL (NEEDLE) IMPLANT
NEEDLE HYPO 25X1 1.5 SAFETY (NEEDLE) ×4 IMPLANT
NEEDLE SPNL 25GX3.5 QUINCKE BL (NEEDLE) IMPLANT
NS IRRIG 1000ML POUR BTL (IV SOLUTION) ×4 IMPLANT
PACK BASIN DAY SURGERY FS (CUSTOM PROCEDURE TRAY) ×4 IMPLANT
PACK ENT DAY SURGERY (CUSTOM PROCEDURE TRAY) ×4 IMPLANT
PAD DRESSING TELFA 3X8 NADH (GAUZE/BANDAGES/DRESSINGS) IMPLANT
SLEEVE SCD COMPRESS KNEE MED (STOCKING) ×4 IMPLANT
SPIKE FLUID TRANSFER (MISCELLANEOUS) IMPLANT
SPLINT NASAL AIRWAY SILICONE (MISCELLANEOUS) IMPLANT
SPLINT NASAL POSISEP X .6X2 (GAUZE/BANDAGES/DRESSINGS) ×2 IMPLANT
SPONGE GAUZE 2X2 8PLY STRL LF (GAUZE/BANDAGES/DRESSINGS) ×4 IMPLANT
SPONGE NEURO XRAY DETECT 1X3 (DISPOSABLE) ×4 IMPLANT
SUCTION FRAZIER HANDLE 10FR (MISCELLANEOUS)
SUCTION TUBE FRAZIER 10FR DISP (MISCELLANEOUS) IMPLANT
SUT CHROMIC 4 0 P 3 18 (SUTURE) ×2 IMPLANT
SUT PLAIN 4 0 ~~LOC~~ 1 (SUTURE) ×2 IMPLANT
SUT PROLENE 3 0 PS 2 (SUTURE) ×2 IMPLANT
SYR 50ML LL SCALE MARK (SYRINGE) IMPLANT
TOWEL GREEN STERILE FF (TOWEL DISPOSABLE) ×4 IMPLANT
TRACKER ENT INSTRUMENT (MISCELLANEOUS) ×4 IMPLANT
TRACKER ENT PATIENT (MISCELLANEOUS) ×4 IMPLANT
TUBE CONNECTING 20X1/4 (TUBING) ×4 IMPLANT
TUBE SALEM SUMP 16 FR W/ARV (TUBING) IMPLANT
TUBING STRAIGHTSHOT EPS 5PK (TUBING) ×4 IMPLANT
YANKAUER SUCT BULB TIP NO VENT (SUCTIONS) ×4 IMPLANT

## 2022-07-13 NOTE — Transfer of Care (Signed)
Immediate Anesthesia Transfer of Care Note  Patient: [name] [name]  Procedure(s) Performed: SINUS ENDO WITH FUSION with bilateral turbonate reduction Total ETHMOIDECTOMY MAXILLARY ANTROSTOMY with tissue removal  Patient Location: PACU  Anesthesia Type:General  Level of Consciousness: awake, alert  and oriented  Airway & Oxygen Therapy: Patient Spontanous Breathing and Patient connected to face mask oxygen  Post-op Assessment: Report given to RN and Post -op Vital signs reviewed and stable  Post vital signs: Reviewed and stable  Last Vitals:  Vitals Value Taken Time  BP    Temp    Pulse 87 07/13/22 1142  Resp 15 07/13/22 1142  SpO2 94 % 07/13/22 1142  Vitals shown include unvalidated device data.  Last Pain:  Vitals:   07/13/22 0756  TempSrc: Oral  PainSc: 0-No pain         Complications: No notable events documented.

## 2022-07-13 NOTE — Discharge Instructions (Addendum)
POSTOPERATIVE INSTRUCTIONS FOR PATIENTS HAVING NASAL OR SINUS OPERATIONS ACTIVITY: Restrict activity at home for the first two days, resting as much as possible. Light activity is best. You may usually return to work within a week. You should refrain from nose blowing, strenuous activity, or heavy lifting greater than 20lbs for a total of one week after your operation.  If sneezing cannot be avoided, sneeze with your mouth open. DISCOMFORT: You may experience a dull headache and pressure along with nasal congestion and discharge. These symptoms may be worse during the first week after the operation but may last as long as two to four weeks.  Please take Tylenol or the pain medication that has been prescribed for you. Do not take aspirin or aspirin containing medications since they may cause bleeding.  You may experience symptoms of post nasal drainage, nasal congestion, headaches and fatigue for two or three months after your operation.  BLEEDING: You may have some blood tinged nasal drainage for approximately two weeks after the operation.  The discharge will be worse for the first week.  Please call our office at [phone] or go to the nearest hospital emergency room if you experience any of the following: heavy, bright red blood from your nose or mouth that lasts longer than 15 minutes or coughing up or vomiting bright red blood or blood clots. GENERAL CONSIDERATIONS: A gauze dressing will be placed on your upper lip to absorb any drainage after the operation. You may need to change this several times a day.  If you do not have very much drainage, you may remove the dressing.  Remember that you may gently wipe your nose with a tissue and sniff in, but DO NOT blow your nose. Please keep all of your postoperative appointments.  Your final results after the operation will depend on proper follow-up.  The initial visit is usually 2 to 5 days after the operation.  During this visit, the remaining nasal  packing and internal septal splints will be removed.  Your nasal and sinus cavities will be cleaned.  During the second visit, your nasal and sinus cavities will be cleaned again. Have someone drive you to your first two postoperative appointments.  How you care for your nose after the operation will influence the results that you obtain.  You should follow all directions, take your medication as prescribed, and call our office [phone] with any problems or questions. You may be more comfortable sleeping with your head elevated on two pillows. Do not take any medications that we have not prescribed or recommended. WARNING SIGNS: if any of the following should occur, please call our office: Persistent fever greater than 102F. Persistent vomiting. Severe and constant pain that is not relieved by prescribed pain medication. Trauma to the nose. Rash or unusual side effects from any medicines.   Post Anesthesia Home Care Instructions  Activity: Get plenty of rest for the remainder of the day. A responsible individual must stay with you for 24 hours following the procedure.  For the next 24 hours, DO NOT: -Drive a car -Paediatric nurse -Drink alcoholic beverages -Take any medication unless instructed by your physician -Make any legal decisions or sign important papers.  Meals: Start with liquid foods such as gelatin or soup. Progress to regular foods as tolerated. Avoid greasy, spicy, heavy foods. If nausea and/or vomiting occur, drink only clear liquids until the nausea and/or vomiting subsides. Call your physician if vomiting continues.  Special Instructions/Symptoms: Your throat may feel dry or  sore from the anesthesia or the breathing tube placed in your throat during surgery. If this causes discomfort, gargle with warm salt water. The discomfort should disappear within 24 hours.  Next dose of Tylenol at 6pm today if needed.

## 2022-07-13 NOTE — Op Note (Signed)
DATE OF PROCEDURE: 07/13/2022  OPERATIVE REPORT   SURGEON: Leta Baptist, MD   PREOPERATIVE DIAGNOSES:  1. Severe nasal septal deviation.  2. Bilateral inferior turbinate hypertrophy.  3. Chronic nasal obstruction. 4. Bilateral chronic maxillary and ethmoid sinusitis/polyposis  POSTOPERATIVE DIAGNOSES:  1. Severe nasal septal deviation.  2. Bilateral inferior turbinate hypertrophy.  3. Chronic nasal obstruction. 4. Bilateral chronic maxillary and ethmoid sinusitis/polyposis  PROCEDURE PERFORMED:  1. Septoplasty.  2. Bilateral partial inferior turbinate resection.  3. Bilateral endoscopic total ethmoidectomy. 4. Bilateral endoscopic maxillary antrostomy with polyp removal. 4. FUSION stereotactic image guidance  ANESTHESIA: General endotracheal tube anesthesia.   COMPLICATIONS: None.   ESTIMATED BLOOD LOSS: 400 mL.   INDICATION FOR PROCEDURE: [name] [name] is a 71 y.o. male with a history of chronic nasal obstruction and bilateral chronic maxillary and ethmoid sinusitis. The patient was treated with multiple courses of antibiotics, decongestant, and steroid nasal sprays. However, the patient continued to be symptomatic. On examination, the patient was noted to have bilateral sinonasal polyps, bilateral severe inferior turbinate hypertrophy and significant nasal septal deviation, causing significant nasal obstruction.  His CT scan also shows bilateral chronic maxillary and ethmoid sinusitis.  Based on the above findings, the decision was made for the patient to undergo the above-stated procedures. The risks, benefits, alternatives, and details of the procedures were discussed with the patient. Questions were invited and answered. Informed consent was obtained.   DESCRIPTION OF PROCEDURE: The patient was taken to the operating room and placed supine on the operating table. General endotracheal tube anesthesia was administered by the anesthesiologist. The patient was positioned, and  prepped and draped in the standard fashion for nasal surgery. Pledgets soaked with Afrin were placed in both nasal cavities for decongestion. The pledgets were subsequently removed. The FUSION stereotactic image guidance marker was placed. The image guidance system was functional throughout the case.  Examination of the nasal cavity revealed a severe nasal septal deviation. 1% lidocaine with 1:100,000 epinephrine was injected onto the nasal septum bilaterally. A hemitransfixion incision was made on the left side. The mucosal flap was carefully elevated on the left side. A cartilaginous incision was made 1 cm superior to the caudal margin of the nasal septum. Mucosal flap was also elevated on the right side in the similar fashion. It should be noted that due to the severe septal deviation, the deviated portion of the cartilaginous and bony septum had to be removed in piecemeal fashion. Once the deviated portions were removed, a straight midline septum was achieved. The septum was then quilted with 4-0 plain gut sutures. The hemitransfixion incision was closed with interrupted 4-0 chromic sutures.   The inferior one half of both hypertrophied inferior turbinate was crossclamped with a Kelly clamp. The inferior one half of each inferior turbinate was then resected with a pair of cross cutting scissors. Hemostasis was achieved with a suction cautery device.   Using a 0 endoscope, the left nasal cavity was examined. A large middle turbinate was noted. Using Tru-Cut forceps, the inferior one third of the middle turbinate was resected. Polypoid tissue was noted within the middle meatus. The polypoid tissue was removed using a combination of microdebrider and Blakesley forceps. The uncinate process was resected with a freer elevator. The maxillary antrum was entered and enlarged using a combination of backbiter and microdebrider. Polypoid tissue was removed from the left maxillary sinus.  Attention was then focused  on the ethmoid sinuses. The bony partitions of the anterior and posterior ethmoid cavities  were taken down. Polypoid tissue was noted and removed. The paranasal sinuses were copiously irrigated with saline solution.  The same procedure was repeated on the right side without exception. More polyps were noted on the right side. All polyps were removed. Doyle splints were applied to the nasal septum.  The care of the patient was turned over to the anesthesiologist. The patient was awakened from anesthesia without difficulty. The patient was extubated and transferred to the recovery room in good condition.   OPERATIVE FINDINGS: Severe nasal septal deviation and bilateral inferior turbinate hypertrophy.  Bilateral chronic maxillary and ethmoid sinusitis/polyposis.  SPECIMEN: Bilateral sinus contents.   FOLLOWUP CARE: The patient be discharged home once he is awake and alert. The patient will be placed on Percocet p.r.n. pain, and clindamycin for 3 days. The patient will follow up in my office in 3 days for splint removal.   Treon Kehl Raynelle Bring, MD

## 2022-07-13 NOTE — Anesthesia Postprocedure Evaluation (Signed)
Anesthesia Post Note  Patient: [name] [name]  Procedure(s) Performed: Septoplasty with bilateral turbinate reduction (Nose) Total Ethmoidectomy with Navigation (Bilateral: Nose) Maxillary Antrostomy with tissue removal (Bilateral: Nose)     Patient location during evaluation: PACU Anesthesia Type: General Level of consciousness: awake and alert Pain management: pain level controlled Vital Signs Assessment: post-procedure vital signs reviewed and stable Respiratory status: spontaneous breathing, nonlabored ventilation, respiratory function stable and patient connected to nasal cannula oxygen Cardiovascular status: blood pressure returned to baseline and stable Postop Assessment: no apparent nausea or vomiting Anesthetic complications: no   No notable events documented.  Last Vitals:  Vitals:   07/13/22 1215 07/13/22 1225  BP: (!) 123/106 (!) 144/71  Pulse: 80 88  Resp: 15 16  Temp:  (!) 36.4 C  SpO2: 91% 94%    Last Pain:  Vitals:   07/13/22 1225  TempSrc:   PainSc: 0-No pain                 Belenda Cruise P Nadiah Corbit

## 2022-07-13 NOTE — Anesthesia Procedure Notes (Signed)
Procedure Name: Intubation Date/Time: 07/13/2022 10:15 AM  Performed by: Verita Lamb, CRNAPre-anesthesia Checklist: Patient identified, Emergency Drugs available, Suction available and Patient being monitored Patient Re-evaluated:Patient Re-evaluated prior to induction Oxygen Delivery Method: Circle system utilized Preoxygenation: Pre-oxygenation with 100% oxygen Induction Type: IV induction Ventilation: Mask ventilation without difficulty Laryngoscope Size: Mac and 4 Grade View: Grade I Tube type: Oral Tube size: 7.0 mm Number of attempts: 1 Airway Equipment and Method: Stylet and Oral airway Placement Confirmation: ETT inserted through vocal cords under direct vision, positive ETCO2, breath sounds checked- equal and bilateral and CO2 detector Secured at: 24 cm Tube secured with: Tape Dental Injury: Teeth and Oropharynx as per pre-operative assessment

## 2022-07-13 NOTE — H&P (Signed)
Cc: Recurrent sinusitis, chronic nasal obstruction  HPI: The patient is a 71 year old male who returns today for his follow-up evaluation.  The patient was previously seen for frequent recurrent sinusitis and chronic nasal obstruction.  At his last visit, he was noted to have nasal septal deviation, bilateral inferior turbinate hypertrophy, and severe nasal mucosal congestion.  The patient was previously treated with multiple courses of antibiotics, allergy medications, Flonase nasal spray, and nasal saline irrigation.  He subsequently underwent a sinus CT scan.  The CT showed bilateral chronic maxillary and ethmoid sinusitis, nasal septal deviation, and bilateral inferior turbinate hypertrophy.  The patient returns today complaining of persistent symptoms.  He has significant difficulty breathing through his nose.  He also has difficulty using his CPAP machine.  He has frequent facial pain and pressure, nasal drainage, and ear discomfort.  He has not responded to medical treatment so far.  Exam: General: Communicates without difficulty, well nourished, no acute distress. Head: Normocephalic, no evidence injury, no tenderness, facial buttresses intact without stepoff. Face/sinus: No tenderness to palpation and percussion. Facial movement is normal and symmetric. Eyes: PERRL, EOMI. No scleral icterus, conjunctivae clear. Neuro: CN II exam reveals vision grossly intact.  No nystagmus at any point of gaze. Ears: Auricles well formed without lesions.  Ear canals are intact without mass or lesion.  No erythema or edema is appreciated.  The TMs are intact without fluid. Nose: External evaluation reveals normal support and skin without lesions.  Dorsum is intact.  Anterior rhinoscopy reveals congested mucosa over anterior aspect of inferior turbinates and intact septum.  No purulence noted. Oral:  Oral cavity and oropharynx are intact, symmetric, without erythema or edema.  Mucosa is moist without lesions. Neck: Full  range of motion without pain.  There is no significant lymphadenopathy.  No masses palpable.  Thyroid bed within normal limits to palpation.  Parotid glands and submandibular glands equal bilaterally without mass.  Trachea is midline. Neuro:  CN 2-12 grossly intact. Gait normal. A flexible scope was inserted into the right nasal cavity.  Endoscopy of the interior nasal cavity, superior, inferior, and middle meatus was performed. The sphenoid-ethmoid recess was examined. Edematous mucosa was noted.  Polypoid mucosa was noted to obstruct the middle meatus. Nasal septal deviation noted.  Olfactory cleft was clear.  Nasopharynx was clear.  Turbinates were hypertrophied but without mass. The procedure was repeated on the contralateral side with similar findings.  The patient tolerated the procedure well.  Assessment: 1.  Bilateral chronic maxillary and ethmoid sinusitis, with frequent exacerbations. 2.  Chronic nasal obstruction, secondary to nasal mucosal congestion, nasal septal deviation, and bilateral inferior turbinate hypertrophy.  Plan: 1.  The nasal endoscopy findings and the CT images are reviewed with the patient. 2.  Continue with Flonase nasal spray and nasal saline irrigation. 3.  In light of his persistent symptoms, he may benefit from surgical intervention with septoplasty, turbinate reduction, and bilateral endoscopic sinus surgery, addressing his maxillary and ethmoid sinuses.  The risk, benefits, and details of the procedures are reviewed.  Questions are invited and answered. 4.  The patient would like to proceed with the procedures.

## 2022-07-13 NOTE — Anesthesia Preprocedure Evaluation (Signed)
Anesthesia Evaluation  Patient identified by MRN, date of birth, ID band Patient awake    Reviewed: Allergy & Precautions, NPO status , Patient's Chart, lab work & pertinent test results  Airway Mallampati: II  TM Distance: >3 FB Neck ROM: Full    Dental  (+) Loose,    Pulmonary sleep apnea , former smoker,    Pulmonary exam normal        Cardiovascular hypertension,  Rhythm:Regular Rate:Normal     Neuro/Psych Depression    GI/Hepatic negative GI ROS, Neg liver ROS,   Endo/Other  diabetes, Type 2, Oral Hypoglycemic Agents, Insulin Dependent  Renal/GU   negative genitourinary   Musculoskeletal  (+) Arthritis , Osteoarthritis,    Abdominal Normal abdominal exam  (+)   Peds  Hematology   Anesthesia Other Findings   Reproductive/Obstetrics                            Anesthesia Physical Anesthesia Plan  ASA: 2  Anesthesia Plan: General   Post-op Pain Management:    Induction: Intravenous  PONV Risk Score and Plan: 2 and Ondansetron, Dexamethasone and Treatment may vary due to age or medical condition  Airway Management Planned: Mask and Oral ETT  Additional Equipment: None  Intra-op Plan:   Post-operative Plan: Extubation in OR  Informed Consent: I have reviewed the patients History and Physical, chart, labs and discussed the procedure including the risks, benefits and alternatives for the proposed anesthesia with the patient or authorized representative who has indicated his/her understanding and acceptance.     Dental advisory given  Plan Discussed with: CRNA  Anesthesia Plan Comments:         Anesthesia Quick Evaluation

## 2022-07-14 ENCOUNTER — Encounter (HOSPITAL_BASED_OUTPATIENT_CLINIC_OR_DEPARTMENT_OTHER): Payer: Self-pay | Admitting: Otolaryngology

## 2022-07-14 LAB — SURGICAL PATHOLOGY

## 2022-07-16 ENCOUNTER — Ambulatory Visit (INDEPENDENT_AMBULATORY_CARE_PROVIDER_SITE_OTHER): Payer: Medicare Other | Admitting: Adult Health

## 2022-07-16 ENCOUNTER — Encounter: Payer: Self-pay | Admitting: Urology

## 2022-07-16 ENCOUNTER — Encounter (INDEPENDENT_AMBULATORY_CARE_PROVIDER_SITE_OTHER): Payer: Self-pay | Admitting: Adult Health

## 2022-07-16 VITALS — BP 114/66 | HR 114 | Temp 97.8°F | Ht 70.0 in | Wt 238.0 lb

## 2022-07-16 DIAGNOSIS — Z7984 Long term (current) use of oral hypoglycemic drugs: Secondary | ICD-10-CM | POA: Diagnosis not present

## 2022-07-16 DIAGNOSIS — Z9889 Other specified postprocedural states: Secondary | ICD-10-CM

## 2022-07-16 DIAGNOSIS — Z6834 Body mass index (BMI) 34.0-34.9, adult: Secondary | ICD-10-CM

## 2022-07-16 DIAGNOSIS — E66811 Obesity, class 1: Secondary | ICD-10-CM

## 2022-07-16 DIAGNOSIS — E669 Obesity, unspecified: Secondary | ICD-10-CM

## 2022-07-16 DIAGNOSIS — Z794 Long term (current) use of insulin: Secondary | ICD-10-CM

## 2022-07-16 DIAGNOSIS — E1169 Type 2 diabetes mellitus with other specified complication: Secondary | ICD-10-CM

## 2022-07-16 DIAGNOSIS — J320 Chronic maxillary sinusitis: Secondary | ICD-10-CM | POA: Diagnosis not present

## 2022-07-16 DIAGNOSIS — Z7985 Long-term (current) use of injectable non-insulin antidiabetic drugs: Secondary | ICD-10-CM

## 2022-07-16 DIAGNOSIS — J322 Chronic ethmoidal sinusitis: Secondary | ICD-10-CM | POA: Diagnosis not present

## 2022-07-16 DIAGNOSIS — J338 Other polyp of sinus: Secondary | ICD-10-CM | POA: Diagnosis not present

## 2022-07-19 ENCOUNTER — Other Ambulatory Visit: Payer: Self-pay | Admitting: Family Medicine

## 2022-07-22 ENCOUNTER — Ambulatory Visit: Payer: Medicare Other | Admitting: Urology

## 2022-07-22 NOTE — Progress Notes (Unsigned)
Chief Complaint:   OBESITY [name] [name] is here to discuss his progress with his obesity treatment plan along with follow-up of his obesity related diagnoses. [name] is on the Category 4 Plan and states he is following his eating plan approximately 70% of the time. [name] states he is not exercising.  Today's visit was #: 82 Starting weight: 273 lbs Starting date: 02/14/2018 Today's weight: 234 lbs Today's date: 07/16/2022 Total lbs lost to date: 39 lbs Total lbs lost since last in-office visit: +4  Interim History: 07/13/2022 underwent  septoplasty with bilateral turbinates reduction.  Post op foods, chili, cookies, muffins, ice cream, bass.  He has a follow up today with Dr Benjamine Mola.   Subjective:   1. Type 2 diabetes mellitus with other specified complication, with long-term current use of insulin ([place]) He is on metformin 1000 mg BID with meals, Levemir 54 units daily, Ozempic 1 mg once weekly, PCP recently assumed prescription.  Fasting blood glucose at home <130.    2. H/O nasal septoplasty ***   Assessment/Plan:   1. Type 2 diabetes mellitus with other specified complication, with long-term current use of insulin ([facility]) Continue antidiabetic therapy per PCP.  2. H/O nasal septoplasty Follow up with surgeon Dr Benjamine Mola.   3. Obesity, current BMI 34.2 1) Building deck on house 2) Seeking retirement community to settle into.  3) [place] trip *** passed  [name] is currently in the action stage of change. As such, his goal is to continue with weight loss efforts. He has agreed to the Category 4 Plan. Post op, increase protein intake, ie, shakes, yogurt.   Exercise goals: No exercise has been prescribed at this time.  Behavioral modification strategies: meal planning and cooking strategies, keeping healthy foods in the home, and planning for success.  [name] has agreed to follow-up with our clinic in 4 weeks. He was informed of the importance of frequent follow-up visits to  maximize his success with intensive lifestyle modifications for his multiple health conditions.   Objective:   Blood pressure 114/66, pulse (!) 114, temperature 97.8 F (36.6 C), height '5\' 10"'$  (1.778 m), weight 238 lb (108 kg), SpO2 95 %. Body mass index is 34.15 kg/m.  General: Cooperative, alert, well developed, in no acute distress. HEENT: Conjunctivae and lids unremarkable. Cardiovascular: Regular rhythm.  Lungs: Normal work of breathing. Neurologic: No focal deficits.   Lab Results  Component Value Date   CREATININE 0.98 07/08/2022   BUN 20 07/08/2022   NA 139 07/08/2022   K 4.1 07/08/2022   CL 105 07/08/2022   CO2 24 07/08/2022   Lab Results  Component Value Date   ALT 14 06/08/2022   AST 12 06/08/2022   ALKPHOS 32 (L) 06/08/2022   BILITOT 0.4 06/08/2022   Lab Results  Component Value Date   HGBA1C 5.4 06/08/2022   HGBA1C 5.4 06/08/2022   HGBA1C 5.4 (A) 06/08/2022   HGBA1C 5.4 06/08/2022   HGBA1C 6.8 (H) 03/03/2022   Lab Results  Component Value Date   INSULIN 3.2 09/15/2021   INSULIN 5.8 08/29/2020   INSULIN 5.3 05/09/2020   Lab Results  Component Value Date   TSH 1.18 03/30/2022   Lab Results  Component Value Date   CHOL 129 06/08/2022   HDL 47.40 06/08/2022   LDLCALC 64 06/08/2022   LDLDIRECT 112.0 07/31/2016   TRIG 90.0 06/08/2022   CHOLHDL 3 06/08/2022   Lab Results  Component Value Date   VD25OH 98.8 09/15/2021   VD25OH 42.0  04/21/2021   VD25OH 59.0 01/28/2021   Lab Results  Component Value Date   WBC 9.3 09/30/2021   HGB 12.8 (L) 09/30/2021   HCT 37.8 (L) 09/30/2021   MCV 87.1 09/30/2021   PLT 303 09/30/2021   No results found for: "IRON", "TIBC", "FERRITIN"  Obesity Behavioral Intervention:   Approximately 15 minutes were spent on the discussion below.  ASK: We discussed the diagnosis of obesity with [name] today and Maahir agreed to give [name] permission to discuss obesity behavioral modification therapy  today.  ASSESS: Bodey has the diagnosis of obesity and his BMI today is 34.2. Ahmadou is in the action stage of change.   ADVISE: Nesta was educated on the multiple health risks of obesity as well as the benefit of weight loss to improve his health. He was advised of the need for long term treatment and the importance of lifestyle modifications to improve his current health and to decrease his risk of future health problems.  AGREE: Multiple dietary modification options and treatment options were discussed and Ilario agreed to follow the recommendations documented in the above note.  ARRANGE: Marsalis was educated on the importance of frequent visits to treat obesity as outlined per CMS and USPSTF guidelines and agreed to schedule his next follow up appointment today.  Attestation Statements:   Reviewed by clinician on day of visit: allergies, medications, problem list, medical history, surgical history, family history, social history, and previous encounter notes.  Time spent on visit including pre-visit chart review and post-visit care and charting was 27 minutes.   I, Davy Pique, RMA, am acting as Location manager for Mina Marble, NP.  I have reviewed the above documentation for accuracy and completeness, and I agree with the above. -  ***

## 2022-07-29 ENCOUNTER — Telehealth: Payer: Self-pay | Admitting: Family Medicine

## 2022-07-29 NOTE — Telephone Encounter (Signed)
Left message for patient to schedule Annual Wellness Visit.  Please schedule (telephone/video call) with Nurse Health Advisor Tina Betterson, RN at [facility]. Please call [phone] ask for Kathy 

## 2022-08-05 ENCOUNTER — Ambulatory Visit (INDEPENDENT_AMBULATORY_CARE_PROVIDER_SITE_OTHER): Payer: Medicare Other

## 2022-08-05 DIAGNOSIS — Z0000 Encounter for general adult medical examination without abnormal findings: Secondary | ICD-10-CM

## 2022-08-05 MED ORDER — TETANUS-DIPHTH-ACELL PERTUSSIS 5-2.5-18.5 LF-MCG/0.5 IM SUSP: 0.5000 mL | Freq: Once | INTRAMUSCULAR | 0 refills | Status: AC

## 2022-08-05 NOTE — Progress Notes (Signed)
Subjective:   [name] [name] is a 71 y.o. male who presents for Medicare Annual/Subsequent preventive examination.  I connected with  [name] [name] on 08/05/22 by an audio only telemedicine application and verified that I am speaking with the correct person using two identifiers.   I discussed the limitations, risks, security and privacy concerns of performing an evaluation and management service by telephone and the availability of in person appointments. I also discussed with the patient that there may be a patient responsible charge related to this service. The patient expressed understanding and verbally consented to this telephonic visit.  Location of Patient: home Location of Provider:office  List any persons and their role that are participating in the visit with the patient.   [place], CMA  Review of Systems    Defer to PCP Cardiac Risk Factors include: advanced age (>25mn, >>63women);diabetes mellitus;male gender;obesity (BMI >30kg/m2);hypertension     Objective:    There were no vitals filed for this visit. There is no height or weight on file to calculate BMI.     08/05/2022   10:04 AM 07/13/2022    7:51 AM 07/06/2022    3:04 PM 03/30/2022    1:55 PM 09/30/2021   10:36 AM 09/29/2021   11:51 AM 09/26/2021    1:45 PM  Advanced Directives  Does Patient Have a Medical Advance Directive? No No No No No No No  Would patient like information on creating a medical advance directive? No - Patient declined No - Patient declined No - Patient declined  No - Patient declined No - Patient declined No - Patient declined    Current Medications (verified) Outpatient Encounter Medications as of 08/05/2022  Medication Sig   alfuzosin (UROXATRAL) 10 MG 24 hr tablet Take 1 tablet (10 mg total) by mouth at bedtime.   atorvastatin (LIPITOR) 80 MG tablet Take 1 tablet (80 mg total) by mouth daily.   azelastine (ASTELIN) 0.1 % nasal spray Place 2 sprays  into both nostrils 2 (two) times daily. Use in each nostril as directed (Patient not taking: Reported on 06/10/2022)   buPROPion (WELLBUTRIN SR) 150 MG 12 hr tablet TAKE 1 TABLET(150 MG) BY MOUTH TWICE DAILY   Choline Fenofibrate (FENOFIBRIC ACID) 135 MG CPDR Take 1 capsule by mouth daily.   escitalopram (LEXAPRO) 10 MG tablet Take 10 mg by mouth daily.   finasteride (PROSCAR) 5 MG tablet TAKE 1 TABLET(5 MG) BY MOUTH DAILY   gabapentin (NEURONTIN) 300 MG capsule Take 3 capsules (900 mg total) by mouth at bedtime.   glucose blood (ONE TOUCH ULTRA TEST) test strip 1 each by Other route 3 (three) times daily. Test sugars up to three times daily. ( insurance preference  DX E11.65 E11.9)   insulin detemir (LEVEMIR FLEXTOUCH) 100 UNIT/ML FlexPen Inject 54 Units into the skin every evening. (Patient taking differently: Inject 40 Units into the skin every evening.)   Insulin Pen Needle (BD PEN NEEDLE NANO 2ND GEN) 32G X 4 MM MISC USE TO INJECT 4 TIMES DAILY   metFORMIN (GLUCOPHAGE) 1000 MG tablet Take 1 tablet (1,000 mg total) by mouth 2 (two) times daily with a meal.   mirtazapine (REMERON) 15 MG tablet TAKE 1 TABLET BY MOUTH AT BEDTIME   Semaglutide, 1 MG/DOSE, 4 MG/3ML SOPN Inject 1 mg as directed once a week.   sodium chloride (OCEAN) 0.65 % nasal spray Place 1 spray into the nose daily as needed for congestion.   tadalafil (CIALIS) 5  MG tablet Take 1 tablet (5 mg total) by mouth every morning.   tamsulosin (FLOMAX) 0.4 MG CAPS capsule Take 1 capsule (0.4 mg total) by mouth at bedtime.   vitamin B-12 (CYANOCOBALAMIN) 1000 MCG tablet Take 2,000 mcg by mouth at bedtime.   No facility-administered encounter medications on file as of 08/05/2022.    Allergies (verified) Simvastatin; Antihistamines, diphenhydramine-type; Codeine; Penicillins; and Sulfa antibiotics   History: Past Medical History:  Diagnosis Date   Allergic rhinitis, unspecified    Allergy testing via allergist 04/2017--mostly neg.  Dr.  Donneta Romberg recommended referral to ENT so i did this.   Aortic root dilatation ([place]) 2019   2019, 41 mm, 2022, 39 mm-->rpt 1 yr.   Back pain    LBP->DDD/spondylosis w/ intermittent radiulopathy-type leg pains, + bilat hip and thigh pain-->spinal stenosis L4-5, L5-S1->Dr. Vertell Limber to do decomp and fusion surgery, scheduled for 12/03/20   BPH (benign prostatic hypertrophy)    no hydro on renal u/s 05/2020   Chronic nasal congestion    DDD (degenerative disc disease), lumbar 2010   laminectomy 01/2009.  MR rpt 05/2019->advanced DDD/spondylosis L3-S1, with mod/sev L4-5 spinal stenosis-->plan for surg per Dr. Vertell Limber as of 09/2020   Depression    Hospitalized for suicidal ideation 1992.  Has been on lexapro since 10/2008.   Diabetes mellitus type II    Dx'd approximately 06/2008.  No retinopathy as of 02/2017 ophth.   Erectile dysfunction    Heart murmur, systolic 43/1540   mild intensity, asymptomatic; echo 01/2018 valves fine->obs   History of colon polyps 01/2016   Recall 3 yrs   History of pneumonia 2008   Hospitalized   History of scarlet fever    age 83   Hyperlipidemia, mixed 1993   myalgias on pravastatin   Hypertension 2009   "marked chronic cardiomegaly" on CXR 01/2009 per old records.   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 Delice Lesch   MRI brain 03/01/20 NORMAL   Nephrolithiasis    calcium oxalate   OSA on CPAP 2004; 2019   Back on CPAP 04/2018.  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  02/21/16: tubular adenoma.  Recall 3 yrs (Dr. Ardis Hughs).   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   CYSTOSCOPY WITH RETROGRADE PYELOGRAM, URETEROSCOPY AND STENT PLACEMENT Bilateral 09/29/2021   Procedure: CYSTOSCOPY WITH RETROGRADE PYELOGRAM, URETEROSCOPY AND STENT PLACEMENT;  Surgeon: Cleon Gustin, MD;  Location: [facility];  Service: Urology;  Laterality: Bilateral;   ELECTROCARDIOGRAM  01/29/2009   NORMAL   HOLMIUM LASER APPLICATION Bilateral 08/67/6195   Procedure: HOLMIUM LASER APPLICATION;  Surgeon: Cleon Gustin, MD;  Location: [facility];  Service: Urology;  Laterality: Bilateral;   LUMBAR LAMINECTOMY  01/2009   MAXILLARY ANTROSTOMY Bilateral 07/13/2022   Procedure: Maxillary  Antrostomy with tissue removal;  Surgeon: Leta Baptist, MD;  Location: [facility];  Service: ENT;  Laterality: Bilateral;   NASAL SEPTOPLASTY W/ TURBINOPLASTY N/A 07/13/2022   Procedure: Septoplasty with bilateral turbinate reduction;  Surgeon: Leta Baptist, MD;  Location: [facility];  Service: ENT;  Laterality: N/A;   POSTERIOR LUMBAR FUSION  12/03/2020   L4-5, L5-S1 (Dr. Vertell Limber)   SINUS ENDO W/FUSION Bilateral 07/13/2022   Procedure: Total Ethmoidectomy with Navigation;  Surgeon: Leta Baptist, MD;  Location: [facility];  Service: ENT;  Laterality: Bilateral;   TONSILLECTOMY AND ADENOIDECTOMY     age 3   TRANSTHORACIC ECHOCARDIOGRAM  02/22/2018   EF 55-60%, grd I DD. Aortic root dilation 41 mm.  05/2021 echo unchanged except aortic root dilation down to 39 mm. Plan rpt 1 yr to monitor aortic root dilation.   URETHRAL DILATION     2 as a child, one as an adult   VASECTOMY  1994   VITRECTOMY Left    Family History  Problem Relation Age of Onset   Heart disease Mother    Hyperlipidemia Mother    Stroke Mother    Diabetes Mother    Depression Mother    Alzheimer's disease Mother    Stroke Father    Hyperlipidemia  Father    Heart disease Father    Diabetes Father    Hypertension Father    Obesity Father    Alcohol abuse Sister    Depression Sister    Sleep apnea Neg Hx    Social History   Socioeconomic History   Marital status: Married    Spouse name: Cassius Cullinane   Number of children: Not on file   Years of education: Not on file   Highest education level: Professional school degree (e.g., MD, DDS, DVM, JD)  Occupational History   Occupation: Retired  Tobacco Use   Smoking status: Former    Types: Cigars    Quit date: 09/04/1981    Years since quitting: 40.9   Smokeless tobacco: Never  Vaping Use   Vaping Use: Never used  Substance and Sexual Activity   Alcohol use: Yes    Comment: social, 3-4 drinks month   Drug use: No   Sexual activity: Yes    Partners: Female  Other Topics Concern   Not on file  Social History Narrative   Divorced, then remarried.  Has 3 sons, no grandchildren.   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   No tobacco.  Rare ETOH.  No drug abuse.   Enjoys reading and spending time with his 2 dogs.   Right handed    Social Determinants of Health   Financial Resource Strain: Low Risk  (08/05/2022)   Overall Financial Resource Strain (CARDIA)    Difficulty of Paying Living Expenses: Not hard at all  Food Insecurity: No Food Insecurity (08/05/2022)   Hunger Vital Sign    Worried About Running Out of Food in the Last Year: Never true    Ran Out of Food in the Last Year: Never true  Transportation Needs: No Transportation Needs (08/05/2022)   PRAPARE - Hydrologist (Medical): No    Lack of Transportation (Non-Medical): No  Physical Activity: Sufficiently Active (08/05/2022)   Exercise Vital Sign    Days of Exercise per Week: 3 days    Minutes of Exercise per Session: 60 min  Stress: No Stress Concern Present (08/05/2022)   [place]    Feeling of Stress : Not at all  Social Connections: Unknown (01/05/2022)   Social Connection and Isolation Panel [NHANES]    Frequency of Communication with Friends and Family: Twice a week    Frequency of Social Gatherings with Friends and Family: Patient refused    Attends Religious Services: Never    Printmaker: No    Attends Music therapist: Not on file    Marital Status: Married    Tobacco Counseling Counseling given: Not Answered   Clinical Intake:     Pain : No/denies pain        How often do you need to have someone help you when you read instructions, pamphlets, or other written materials from your doctor or pharmacy?: 1 - Never  Diabetic?no  Interpreter Needed?: No      Activities of Daily Living    08/05/2022    9:59 AM 07/13/2022    7:54 AM  In your present state of health, do you have any difficulty performing the following activities:  Hearing? 1 0  Vision? 1 0  Difficulty concentrating or making decisions? 1 0  Walking or climbing stairs? 0 0  Dressing or bathing? 0 0  Doing errands, shopping? 0   Preparing Food and eating ? N   Using the Toilet? N   In the past six months, have you accidently leaked urine? N   Do you have problems with loss of bowel control? N   Managing your Medications? N   Managing your Finances? N   Housekeeping or managing your Housekeeping? N     Patient Care Team: Tammi Sou, MD as PCP - General (Family Medicine) Zadie Rhine Clent Demark, MD as Consulting Physician (Ophthalmology) Milus Banister, MD as Consulting Physician (Gastroenterology) Mosetta Anis, MD as Consulting Physician (Allergy) Star Age, MD as Consulting Physician (Neurology) Georgia Lopes, DO as Consulting Physician (Bariatrics) Erline Levine, MD as Consulting Physician (Neurosurgery) Cameron Sprang, MD as Consulting Physician (Neurology) Festus Aloe, MD as  Consulting Physician (Urology) Alyson Ingles Candee Furbish, MD as Consulting Physician (Urology) Leta Baptist, MD as Consulting Physician (Otolaryngology)  Indicate any recent Medical Services you may have received from other than [facility] providers in the past year (date may be approximate).     Assessment:   This is a routine wellness examination for [facility]  Hearing/Vision screen No results found.  Dietary issues and exercise activities discussed: Current Exercise Habits: The patient does not participate in regular exercise at present, Type of exercise: walking   Goals Addressed   None   Depression Screen    08/05/2022   10:02 AM 06/08/2022    1:31 PM 03/03/2022   10:43 AM 07/30/2021   12:46 PM 05/22/2021   10:05 AM 11/17/2019    9:40 AM 08/10/2018   10:10 AM  PHQ 2/9 Scores  PHQ - 2 Score 0 2 1 0 0 0 0  PHQ- 9 Score     0 2 4    Fall Risk    08/05/2022    8:41 AM 06/08/2022    1:31 PM 03/30/2022    1:54 PM 03/03/2022   10:42 AM 01/05/2022    1:43 PM  Fall Risk   Falls in the past year? '1 1 1 1 1  '$ Number falls in past yr: 0 0 '1 1 1  '$ Injury with  Fall? 0 0 0 1 0  Risk for fall due to : Impaired vision Impaired vision  Impaired vision   Follow up Falls evaluation completed Falls evaluation completed  Falls evaluation completed     FALL RISK PREVENTION PERTAINING TO THE HOME:  Any stairs in or around the home? Yes  If so, are there any without handrails? Yes  Home free of loose throw rugs in walkways, pet beds, electrical cords, etc? Yes  Adequate lighting in your home to reduce risk of falls? Yes   ASSISTIVE DEVICES UTILIZED TO PREVENT FALLS:  Life alert? No  Use of a cane, walker or w/c? No  Grab bars in the bathroom? No  Shower chair or bench in shower? No  Elevated toilet seat or a handicapped toilet? Yes   TIMED UP AND GO:  Was the test performed? No .  Length of time to ambulate 10 feet: n/a sec.    Cognitive Function:    05/24/2018    7:27 AM  MMSE - Mini Mental State  Exam  Orientation to time 5  Orientation to Place 5  Registration 3  Attention/ Calculation 5  Recall 3  Language- name 2 objects 2  Language- repeat 1  Language- follow 3 step command 3  Language- read & follow direction 1  Write a sentence 1  Copy design 0  Total score 29      03/30/2022    2:00 PM 02/14/2020   10:00 AM  Montreal Cognitive Assessment   Visuospatial/ Executive (0/5) 4 4  Naming (0/3) 3 3  Attention: Read list of digits (0/2) 2 2  Attention: Read list of letters (0/1) 1 1  Attention: Serial 7 subtraction starting at 100 (0/3) 3 3  Language: Repeat phrase (0/2) 2 2  Language : Fluency (0/1) 1 1  Abstraction (0/2) 2 1  Delayed Recall (0/5) 3 0  Orientation (0/6) 6 6  Total 27 23  Adjusted Score (based on education)  23      08/05/2022   10:08 AM  6CIT Screen  What Year? 0 points  What month? 0 points  What time? 0 points  Count back from 20 0 points  Repeat phrase 0 points    Immunizations Immunization History  Administered Date(s) Administered   Fluad Quad(high Dose 65+) 09/12/2019   Influenza Split 10/15/2011, 11/10/2012   Influenza, High Dose Seasonal PF 08/10/2018   Influenza,inj,Quad PF,6+ Mos 11/27/2013, 07/27/2014, 07/31/2016   Influenza-Unspecified 10/25/2017, 10/07/2020, 09/12/2021   Moderna Covid-19 Vaccine Bivalent Booster 58yr & up 09/12/2021   Moderna Sars-Covid-2 Vaccination 01/11/2020, 02/08/2020, 10/07/2020   Pneumococcal Conjugate-13 02/04/2018   Pneumococcal Polysaccharide-23 08/25/2007, 11/18/2012, 11/17/2019   Tdap 10/15/2011   Zoster Recombinat (Shingrix) 10/25/2017, 03/22/2018   Zoster, Live 02/24/2013    TDAP status: Due, Education has been provided regarding the importance of this vaccine. Advised may receive this vaccine at local pharmacy or Health Dept. Aware to provide a copy of the vaccination record if obtained from local pharmacy or Health Dept. Verbalized acceptance and understanding.  Flu Vaccine status: Due,  Education has been provided regarding the importance of this vaccine. Advised may receive this vaccine at local pharmacy or Health Dept. Aware to provide a copy of the vaccination record if obtained from local pharmacy or Health Dept. Verbalized acceptance and understanding.  Pneumococcal vaccine status: Up to date  Covid-19 vaccine status: Completed vaccines  Qualifies for Shingles Vaccine? Yes   Zostavax completed No   Shingrix Completed?: Yes  Screening Tests  Health Maintenance  Topic Date Due   OPHTHALMOLOGY EXAM  05/15/2020   TETANUS/TDAP  10/14/2021   COVID-19 Vaccine (5 - Moderna risk series) 11/07/2021   FOOT EXAM  05/22/2022   INFLUENZA VACCINE  06/30/2022   HEMOGLOBIN A1C  12/09/2022   COLONOSCOPY (Pts 45-61yr Insurance coverage will need to be confirmed)  02/21/2023   URINE MICROALBUMIN  06/09/2023   Pneumonia Vaccine 71 Years old  Completed   Hepatitis C Screening  Completed   Zoster Vaccines- Shingrix  Completed   HPV VACCINES  Aged Out    Health Maintenance  Health Maintenance Due  Topic Date Due   OPHTHALMOLOGY EXAM  05/15/2020   TETANUS/TDAP  10/14/2021   COVID-19 Vaccine (5 - Moderna risk series) 11/07/2021   FOOT EXAM  05/22/2022   INFLUENZA VACCINE  06/30/2022    Colorectal cancer screening: Type of screening: Colonoscopy. Completed 02/21/2016. Repeat every 7 years  Lung Cancer Screening: (Low Dose CT Chest recommended if Age 69-80years, 30 pack-year currently smoking OR have quit w/in 15years.) does not qualify.   Lung Cancer Screening Referral: n/a  Additional Screening:  Hepatitis C Screening: does qualify; Completed 01/18/2017  Vision Screening: Recommended annual ophthalmology exams for early detection of glaucoma and other disorders of the eye. Is the patient up to date with their annual eye exam?  No  Who is the provider or what is the name of the office in which the patient attends annual eye exams? Diabetic and retina care If pt is not  established with a provider, would they like to be referred to a provider to establish care? No .   Dental Screening: Recommended annual dental exams for proper oral hygiene  Community Resource Referral / Chronic Care Management: CRR required this visit?  No   CCM required this visit?  No      Plan:     I have personally reviewed and noted the following in the patient's chart:   Medical and social history Use of alcohol, tobacco or illicit drugs  Current medications and supplements including opioid prescriptions. Patient is not currently taking opioid prescriptions. Functional ability and status Nutritional status Physical activity Advanced directives List of other physicians Hospitalizations, surgeries, and ER visits in previous 12 months Vitals Screenings to include cognitive, depression, and falls Referrals and appointments  In addition, I have reviewed and discussed with patient certain preventive protocols, quality metrics, and best practice recommendations. A written personalized care plan for preventive services as well as general preventive health recommendations were provided to patient.     VOctaviano Glow CMA   08/05/2022   Nurse Notes: Non-Face to Face or Face to Face 10 minute visit Encounter   Mr. KSkelton, Thank you for taking time to come for your Medicare Wellness Visit. I appreciate your ongoing commitment to your health goals. Please review the following plan we discussed and let me know if I can assist you in the future.   These are the goals we discussed:  Goals      Patient Stated     Maintain current lifestyle         This is a list of the screening recommended for you and due dates:  Health Maintenance  Topic Date Due   Eye exam for diabetics  05/15/2020   Tetanus Vaccine  10/14/2021   COVID-19 Vaccine (5 - Moderna risk series) 11/07/2021   Complete foot exam   05/22/2022   Flu Shot  06/30/2022   Hemoglobin A1C  12/09/2022   Colon  Cancer Screening  02/21/2023   Urine Protein Check  06/09/2023   Pneumonia Vaccine  Completed   Hepatitis C Screening: USPSTF Recommendation to screen - Ages 60-79 yo.  Completed   Zoster (Shingles) Vaccine  Completed   HPV Vaccine  Aged Out

## 2022-08-05 NOTE — Patient Instructions (Signed)
Health Maintenance, Male Adopting a healthy lifestyle and getting preventive care are important in promoting health and wellness. Ask your health care provider about: The right schedule for you to have regular tests and exams. Things you can do on your own to prevent diseases and keep yourself healthy. What should I know about diet, weight, and exercise? Eat a healthy diet  Eat a diet that includes plenty of vegetables, fruits, low-fat dairy products, and lean protein. Do not eat a lot of foods that are high in solid fats, added sugars, or sodium. Maintain a healthy weight Body mass index (BMI) is a measurement that can be used to identify possible weight problems. It estimates body fat based on height and weight. Your health care provider can help determine your BMI and help you achieve or maintain a healthy weight. Get regular exercise Get regular exercise. This is one of the most important things you can do for your health. Most adults should: Exercise for at least 150 minutes each week. The exercise should increase your heart rate and make you sweat (moderate-intensity exercise). Do strengthening exercises at least twice a week. This is in addition to the moderate-intensity exercise. Spend less time sitting. Even light physical activity can be beneficial. Watch cholesterol and blood lipids Have your blood tested for lipids and cholesterol at 69 years of age, then have this test every 5 years. You may need to have your cholesterol levels checked more often if: Your lipid or cholesterol levels are high. You are older than 69 years of age. You are at high risk for heart disease. What should I know about cancer screening? Many types of cancers can be detected early and may often be prevented. Depending on your health history and family history, you may need to have cancer screening at various ages. This may include screening for: Colorectal cancer. Prostate cancer. Skin cancer. Lung  cancer. What should I know about heart disease, diabetes, and high blood pressure? Blood pressure and heart disease High blood pressure causes heart disease and increases the risk of stroke. This is more likely to develop in people who have high blood pressure readings or are overweight. Talk with your health care provider about your target blood pressure readings. Have your blood pressure checked: Every 3-5 years if you are 18-39 years of age. Every year if you are 40 years old or older. If you are between the ages of 65 and 75 and are a current or former smoker, ask your health care provider if you should have a one-time screening for abdominal aortic aneurysm (AAA). Diabetes Have regular diabetes screenings. This checks your fasting blood sugar level. Have the screening done: Once every three years after age 45 if you are at a normal weight and have a low risk for diabetes. More often and at a younger age if you are overweight or have a high risk for diabetes. What should I know about preventing infection? Hepatitis B If you have a higher risk for hepatitis B, you should be screened for this virus. Talk with your health care provider to find out if you are at risk for hepatitis B infection. Hepatitis C Blood testing is recommended for: Everyone born from 1945 through 1965. Anyone with known risk factors for hepatitis C. Sexually transmitted infections (STIs) You should be screened each year for STIs, including gonorrhea and chlamydia, if: You are sexually active and are younger than 69 years of age. You are older than 69 years of age and your   health care provider tells you that you are at risk for this type of infection. Your sexual activity has changed since you were last screened, and you are at increased risk for chlamydia or gonorrhea. Ask your health care provider if you are at risk. Ask your health care provider about whether you are at high risk for HIV. Your health care provider  may recommend a prescription medicine to help prevent HIV infection. If you choose to take medicine to prevent HIV, you should first get tested for HIV. You should then be tested every 3 months for as long as you are taking the medicine. Follow these instructions at home: Alcohol use Do not drink alcohol if your health care provider tells you not to drink. If you drink alcohol: Limit how much you have to 0-2 drinks a day. Know how much alcohol is in your drink. In the [place] one drink equals one 12 oz bottle of beer (355 mL), one 5 oz glass of wine (148 mL), or one 1 oz glass of hard liquor (44 mL). Lifestyle Do not use any products that contain nicotine or tobacco. These products include cigarettes, chewing tobacco, and vaping devices, such as e-cigarettes. If you need help quitting, ask your health care provider. Do not use street drugs. Do not share needles. Ask your health care provider for help if you need support or information about quitting drugs. General instructions Schedule regular health, dental, and eye exams. Stay current with your vaccines. Tell your health care provider if: You often feel depressed. You have ever been abused or do not feel safe at home. Summary Adopting a healthy lifestyle and getting preventive care are important in promoting health and wellness. Follow your health care provider's instructions about healthy diet, exercising, and getting tested or screened for diseases. Follow your health care provider's instructions on monitoring your cholesterol and blood pressure. This information is not intended to replace advice given to you by your health care provider. Make sure you discuss any questions you have with your health care provider. Document Revised: 04/07/2021 Document Reviewed: 04/07/2021 Elsevier Patient Education  2023 Elsevier Inc.  

## 2022-08-10 DIAGNOSIS — J322 Chronic ethmoidal sinusitis: Secondary | ICD-10-CM | POA: Diagnosis not present

## 2022-08-10 DIAGNOSIS — J338 Other polyp of sinus: Secondary | ICD-10-CM | POA: Diagnosis not present

## 2022-08-10 DIAGNOSIS — J320 Chronic maxillary sinusitis: Secondary | ICD-10-CM | POA: Diagnosis not present

## 2022-08-13 ENCOUNTER — Encounter (INDEPENDENT_AMBULATORY_CARE_PROVIDER_SITE_OTHER): Payer: Self-pay | Admitting: Adult Health

## 2022-08-13 ENCOUNTER — Ambulatory Visit (INDEPENDENT_AMBULATORY_CARE_PROVIDER_SITE_OTHER): Payer: Medicare Other | Admitting: Adult Health

## 2022-08-13 VITALS — BP 121/73 | HR 79 | Temp 98.2°F | Ht 70.0 in | Wt 245.0 lb

## 2022-08-13 DIAGNOSIS — E1169 Type 2 diabetes mellitus with other specified complication: Secondary | ICD-10-CM

## 2022-08-13 DIAGNOSIS — Z7985 Long-term (current) use of injectable non-insulin antidiabetic drugs: Secondary | ICD-10-CM | POA: Diagnosis not present

## 2022-08-13 DIAGNOSIS — E66811 Obesity, class 1: Secondary | ICD-10-CM

## 2022-08-13 DIAGNOSIS — E669 Obesity, unspecified: Secondary | ICD-10-CM

## 2022-08-13 DIAGNOSIS — Z9889 Other specified postprocedural states: Secondary | ICD-10-CM

## 2022-08-13 DIAGNOSIS — Z6835 Body mass index (BMI) 35.0-35.9, adult: Secondary | ICD-10-CM

## 2022-08-13 DIAGNOSIS — Z7984 Long term (current) use of oral hypoglycemic drugs: Secondary | ICD-10-CM | POA: Diagnosis not present

## 2022-08-13 DIAGNOSIS — Z794 Long term (current) use of insulin: Secondary | ICD-10-CM | POA: Diagnosis not present

## 2022-08-19 NOTE — Progress Notes (Signed)
Chief Complaint:   OBESITY Zebulen is here to discuss his progress with his obesity treatment plan along with follow-up of his obesity related diagnoses. Ming is on the Category 4 Plan and states he is following his eating plan approximately 60% of the time. Vencent states he is not exercising.    Today's visit was #: 9 Starting weight: 273 lbs Starting date: 02/14/2018 Today's weight: 245 lbs Today's date: 08/13/2022 Total lbs lost to date: 28 lbs Total lbs lost since last in-office visit: +7 lbs  Interim History: He endorses an increase in emotional eating.  He has been consuming ice cream, chocolate, bread, and butter.   His wife is concerned about their health and wellbeing in later life. His wife wants to sell their large [place] home and move to a [place] retirement community - in an apartment.   Subjective:   1. H/O nasal septoplasty 07/13/22 - Septoplasty with bilateral turbinate reduction Johndavid endorses normal sense of smell and taste.   He has one more post op appointment with ENT.   2. Type 2 diabetes mellitus with other specified complication, with long-term current use of insulin (HCC) Fasting  blood glucose: 120-150s. He denies sx's of hypoglycemia. Lab Results  Component Value Date   HGBA1C 5.4 06/08/2022   HGBA1C 5.4 06/08/2022   HGBA1C 5.4 (A) 06/08/2022   HGBA1C 5.4 06/08/2022   He is currently on Metformin '1000mg'$  BID, Levemir 40 U QD, Ozempic '1mg'$  once week.  Assessment/Plan:   1. H/O nasal septoplasty Follow up with [facility] as directed.   2. Type 2 diabetes mellitus with other specified complication, with long-term current use of insulin (HCC) Continue Metformin '1000mg'$  BID, Levemir 40 U QD, Ozempic '1mg'$  once week.  3. Obesity, current BMI 35.2 Zein is currently in the action stage of change. As such, his goal is to continue with weight loss efforts. He has agreed to the Category 4 Plan.   Exercise goals: Older adults should follow  the adult guidelines. When older adults cannot meet the adult guidelines, they should be as physically active as their abilities and conditions will allow.   Behavioral modification strategies: increasing lean protein intake, decreasing simple carbohydrates, meal planning and cooking strategies, keeping healthy foods in the home, better snacking choices, emotional eating strategies, and planning for success.  Eliazar has agreed to follow-up with our clinic in 4 weeks. He was informed of the importance of frequent follow-up visits to maximize his success with intensive lifestyle modifications for his multiple health conditions.   Objective:   Blood pressure 121/73, pulse 79, temperature 98.2 F (36.8 C), height '5\' 10"'$  (1.778 m), weight 245 lb (111.1 kg), SpO2 95 %. Body mass index is 35.15 kg/m.  General: Cooperative, alert, well developed, in no acute distress. HEENT: Conjunctivae and lids unremarkable. Cardiovascular: Regular rhythm.  Lungs: Normal work of breathing. Neurologic: No focal deficits.   Lab Results  Component Value Date   CREATININE 0.98 07/08/2022   BUN 20 07/08/2022   NA 139 07/08/2022   K 4.1 07/08/2022   CL 105 07/08/2022   CO2 24 07/08/2022   Lab Results  Component Value Date   ALT 14 06/08/2022   AST 12 06/08/2022   ALKPHOS 32 (L) 06/08/2022   BILITOT 0.4 06/08/2022   Lab Results  Component Value Date   HGBA1C 5.4 06/08/2022   HGBA1C 5.4 06/08/2022   HGBA1C 5.4 (A) 06/08/2022   HGBA1C 5.4 06/08/2022   HGBA1C 6.8 (H) 03/03/2022  Lab Results  Component Value Date   INSULIN 3.2 09/15/2021   INSULIN 5.8 08/29/2020   INSULIN 5.3 05/09/2020   Lab Results  Component Value Date   TSH 1.18 03/30/2022   Lab Results  Component Value Date   CHOL 129 06/08/2022   HDL 47.40 06/08/2022   LDLCALC 64 06/08/2022   LDLDIRECT 112.0 07/31/2016   TRIG 90.0 06/08/2022   CHOLHDL 3 06/08/2022   Lab Results  Component Value Date   VD25OH 98.8 09/15/2021    VD25OH 42.0 04/21/2021   VD25OH 59.0 01/28/2021   Lab Results  Component Value Date   WBC 9.3 09/30/2021   HGB 12.8 (L) 09/30/2021   HCT 37.8 (L) 09/30/2021   MCV 87.1 09/30/2021   PLT 303 09/30/2021   No results found for: "IRON", "TIBC", "FERRITIN"  Obesity Behavioral Intervention:   Approximately 15 minutes were spent on the discussion below.  ASK: We discussed the diagnosis of obesity with [name] today and Garison agreed to give [name] permission to discuss obesity behavioral modification therapy today.  ASSESS: Zavon has the diagnosis of obesity and his BMI today is 35.2. Servando is in the action stage of change.   ADVISE: Johny was educated on the multiple health risks of obesity as well as the benefit of weight loss to improve his health. He was advised of the need for long term treatment and the importance of lifestyle modifications to improve his current health and to decrease his risk of future health problems.  AGREE: Multiple dietary modification options and treatment options were discussed and Earland agreed to follow the recommendations documented in the above note.  ARRANGE: Jermal was educated on the importance of frequent visits to treat obesity as outlined per CMS and USPSTF guidelines and agreed to schedule his next follow up appointment today.  Attestation Statements:   Reviewed by clinician on day of visit: allergies, medications, problem list, medical history, surgical history, family history, social history, and previous encounter notes.  I, Davy Pique, RMA, am acting as Location manager for Mina Marble, NP.  I have reviewed the above documentation for accuracy and completeness, and I agree with the above. -  Christiana Gurevich d. Andelyn Spade, NP-C

## 2022-08-26 ENCOUNTER — Other Ambulatory Visit: Payer: Self-pay | Admitting: Family Medicine

## 2022-08-28 ENCOUNTER — Other Ambulatory Visit: Payer: Self-pay | Admitting: Family Medicine

## 2022-08-28 ENCOUNTER — Encounter (INDEPENDENT_AMBULATORY_CARE_PROVIDER_SITE_OTHER): Payer: Self-pay | Admitting: Adult Health

## 2022-08-28 DIAGNOSIS — Z794 Long term (current) use of insulin: Secondary | ICD-10-CM

## 2022-08-31 ENCOUNTER — Encounter (INDEPENDENT_AMBULATORY_CARE_PROVIDER_SITE_OTHER): Payer: Self-pay | Admitting: Adult Health

## 2022-08-31 DIAGNOSIS — H18612 Keratoconus, stable, left eye: Secondary | ICD-10-CM | POA: Diagnosis not present

## 2022-08-31 DIAGNOSIS — H25811 Combined forms of age-related cataract, right eye: Secondary | ICD-10-CM | POA: Diagnosis not present

## 2022-08-31 DIAGNOSIS — H18611 Keratoconus, stable, right eye: Secondary | ICD-10-CM | POA: Diagnosis not present

## 2022-08-31 DIAGNOSIS — Z947 Corneal transplant status: Secondary | ICD-10-CM | POA: Diagnosis not present

## 2022-08-31 DIAGNOSIS — Z961 Presence of intraocular lens: Secondary | ICD-10-CM | POA: Diagnosis not present

## 2022-08-31 DIAGNOSIS — H18413 Arcus senilis, bilateral: Secondary | ICD-10-CM | POA: Diagnosis not present

## 2022-08-31 DIAGNOSIS — H11153 Pinguecula, bilateral: Secondary | ICD-10-CM | POA: Diagnosis not present

## 2022-09-02 DIAGNOSIS — J320 Chronic maxillary sinusitis: Secondary | ICD-10-CM | POA: Diagnosis not present

## 2022-09-02 DIAGNOSIS — J322 Chronic ethmoidal sinusitis: Secondary | ICD-10-CM | POA: Diagnosis not present

## 2022-09-02 DIAGNOSIS — J338 Other polyp of sinus: Secondary | ICD-10-CM | POA: Diagnosis not present

## 2022-09-08 ENCOUNTER — Other Ambulatory Visit: Payer: Self-pay | Admitting: Family Medicine

## 2022-09-09 ENCOUNTER — Encounter: Payer: Self-pay | Admitting: Family Medicine

## 2022-09-09 ENCOUNTER — Ambulatory Visit (INDEPENDENT_AMBULATORY_CARE_PROVIDER_SITE_OTHER): Payer: Medicare Other | Admitting: Family Medicine

## 2022-09-09 ENCOUNTER — Other Ambulatory Visit: Payer: Self-pay | Admitting: Family Medicine

## 2022-09-09 VITALS — BP 126/73 | HR 76 | Temp 98.1°F | Ht 70.0 in | Wt 252.2 lb

## 2022-09-09 DIAGNOSIS — F3289 Other specified depressive episodes: Secondary | ICD-10-CM

## 2022-09-09 DIAGNOSIS — I10 Essential (primary) hypertension: Secondary | ICD-10-CM

## 2022-09-09 DIAGNOSIS — E7800 Pure hypercholesterolemia, unspecified: Secondary | ICD-10-CM

## 2022-09-09 DIAGNOSIS — E1169 Type 2 diabetes mellitus with other specified complication: Secondary | ICD-10-CM | POA: Diagnosis not present

## 2022-09-09 DIAGNOSIS — Z794 Long term (current) use of insulin: Secondary | ICD-10-CM | POA: Diagnosis not present

## 2022-09-09 LAB — POCT GLYCOSYLATED HEMOGLOBIN (HGB A1C)
HbA1c POC (<> result, manual entry): 6.3 % (ref 4.0–5.6)
HbA1c, POC (controlled diabetic range): 6.3 % (ref 0.0–7.0)
HbA1c, POC (prediabetic range): 6.3 % (ref 5.7–6.4)
Hemoglobin A1C: 6.3 % — AB (ref 4.0–5.6)

## 2022-09-09 MED ORDER — GLUCOSE BLOOD VI STRP: 1.0000 | ORAL_STRIP | Freq: Three times a day (TID) | 11 refills | Status: AC

## 2022-09-09 MED ORDER — OZEMPIC (1 MG/DOSE) 4 MG/3ML ~~LOC~~ SOPN: PEN_INJECTOR | SUBCUTANEOUS | 0 refills | Status: DC

## 2022-09-09 MED ORDER — FENOFIBRIC ACID 135 MG PO CPDR: 1.0000 | DELAYED_RELEASE_CAPSULE | Freq: Every day | ORAL | 1 refills | Status: AC

## 2022-09-09 MED ORDER — TADALAFIL 5 MG PO TABS: 5.0000 mg | ORAL_TABLET | Freq: Every morning | ORAL | 1 refills | Status: DC

## 2022-09-09 MED ORDER — FINASTERIDE 5 MG PO TABS: ORAL_TABLET | ORAL | 1 refills | Status: AC

## 2022-09-09 MED ORDER — ALFUZOSIN HCL ER 10 MG PO TB24: 10.0000 mg | ORAL_TABLET | Freq: Every day | ORAL | 11 refills | Status: AC

## 2022-09-09 MED ORDER — GABAPENTIN 300 MG PO CAPS: 900.0000 mg | ORAL_CAPSULE | Freq: Every day | ORAL | 0 refills | Status: DC

## 2022-09-09 MED ORDER — BD PEN NEEDLE NANO 2ND GEN 32G X 4 MM MISC: 5 refills | Status: AC

## 2022-09-09 MED ORDER — GABAPENTIN 300 MG PO CAPS: 900.0000 mg | ORAL_CAPSULE | Freq: Every day | ORAL | 1 refills | Status: DC

## 2022-09-09 MED ORDER — MIRTAZAPINE 15 MG PO TABS: 15.0000 mg | ORAL_TABLET | Freq: Every day | ORAL | 0 refills | Status: AC

## 2022-09-09 MED ORDER — METFORMIN HCL 1000 MG PO TABS: 1000.0000 mg | ORAL_TABLET | Freq: Two times a day (BID) | ORAL | 0 refills | Status: DC

## 2022-09-09 MED ORDER — AZELASTINE HCL 0.1 % NA SOLN: 2.0000 | Freq: Two times a day (BID) | NASAL | 11 refills | Status: AC

## 2022-09-09 MED ORDER — LEVEMIR FLEXTOUCH 100 UNIT/ML ~~LOC~~ SOPN: 40.0000 [IU] | PEN_INJECTOR | Freq: Every evening | SUBCUTANEOUS | 1 refills | Status: DC

## 2022-09-09 MED ORDER — BUPROPION HCL ER (SR) 150 MG PO TB12: ORAL_TABLET | ORAL | 0 refills | Status: DC

## 2022-09-09 NOTE — Progress Notes (Signed)
OFFICE VISIT  09/09/2022  CC:  Chief Complaint  Patient presents with   Diabetes   Hyperlipidemia   Patient is a 71 y.o. male who presents for 64-monthfollow-up diabetes, hyperlipidemia, and history of depression. A/P as of last visit: "#1 diabetes without complication. Point-of-care hemoglobin A1c today was 5.4%. Doing very well on current regime Levemir 40 units daily, Ozempic 1 mg weekly, and metformin 1000 mg twice a day. Urine microalbumin/creatinine today.   #2 history of hypertension. Weight loss has helped this. He maintains good blood pressure on no antihypertensives.   #3 hyperlipidemia, doing well on atorvastatin 80 mg a day as well as 135 mg of fenofibric acid daily. Lipid panel and hepatic panel today   #4 polypharmacy. Reviewed medications together and due to his depression being in remission he would like to wean off Lexapro.  Advised to take half of his 20 mg Lexapro daily for 2 weeks and then half every other day for another 2 weeks before stopping He will continue on Wellbutrin SR 150 twice daily."  INTERIM HX: Unfortunately TGershon Musselis [place] He has had a lot of stress associated with this transition and has been off of his diet. Additionally, he is limited in exercise due to his back pain.  He can walk a couple of blocks before having to stop and rest.  Home glucoses ranging 105-150.  Averaging 120s.  ROS as above, plus--> no fevers, no CP, no SOB, no wheezing, no cough, no dizziness, no HAs, no rashes, no melena/hematochezia.  No polyuria or polydipsia.  No myalgias or arthralgias.  No focal weakness, paresthesias, or tremors.  No acute vision or hearing abnormalities.  No dysuria or unusual/new urinary urgency or frequency.  No recent changes in lower legs. No n/v/d or abd pain.  No palpitations.    Past Medical History:  Diagnosis Date   Allergic rhinitis, unspecified    Allergy  testing via allergist 04/2017--mostly neg.  Dr. SDonneta Rombergrecommended referral to ENT so i did this.   [place]   2019, 41 mm, 2022, 39 mm-->rpt 1 yr.   Back pain    LBP->DDD/spondylosis w/ intermittent radiulopathy-type leg pains, + bilat hip and thigh pain-->spinal stenosis L4-5, L5-S1->Dr. SVertell Limberto do decomp and fusion surgery, scheduled for 12/03/20   BPH (benign prostatic hypertrophy)    no hydro on renal u/s 05/2020   Chronic nasal congestion    DDD (degenerative disc disease), lumbar 2010   laminectomy 01/2009.  MR rpt 05/2019->advanced DDD/spondylosis L3-S1, with mod/sev L4-5 spinal stenosis-->plan for surg per Dr. SVertell Limberas of 09/2020   Depression    Hospitalized for suicidal ideation 1992.  Has been on lexapro since 10/2008.   Diabetes mellitus type II    Dx'd approximately 06/2008.  No retinopathy as of 02/2017 ophth.   Erectile dysfunction    Heart murmur, systolic 189/2119  mild intensity, asymptomatic; echo 01/2018 valves fine->obs   History of colon polyps 01/2016   Recall 3 yrs   History of pneumonia 2008   Hospitalized   History of scarlet fever    age 71  Hyperlipidemia, mixed 1993   myalgias on pravastatin   Hypertension 2009   "marked chronic cardiomegaly" on CXR 01/2009 per old records.   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 Delice Lesch   MRI brain 03/01/20 NORMAL   Nephrolithiasis    calcium oxalate   OSA on CPAP 2004; 2019   Back on CPAP 04/2018.  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  02/21/16: tubular adenoma.  Recall 3 yrs (Dr. Ardis Hughs).   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   CYSTOSCOPY WITH RETROGRADE PYELOGRAM, URETEROSCOPY AND STENT PLACEMENT Bilateral 09/29/2021   Procedure: CYSTOSCOPY WITH RETROGRADE PYELOGRAM, URETEROSCOPY AND STENT PLACEMENT;  Surgeon: Cleon Gustin, MD;  Location: [facility];  Service: Urology;  Laterality: Bilateral;   ELECTROCARDIOGRAM  01/29/2009   NORMAL   HOLMIUM LASER APPLICATION Bilateral 35/00/9381   Procedure: HOLMIUM LASER APPLICATION;  Surgeon: Cleon Gustin, MD;  Location: [facility];  Service: Urology;  Laterality: Bilateral;   LUMBAR LAMINECTOMY  01/2009   MAXILLARY ANTROSTOMY Bilateral 07/13/2022   Procedure: Maxillary Antrostomy with tissue removal;  Surgeon: Leta Baptist, MD;  Location: [place] [facility];  Service: ENT;  Laterality: Bilateral;   NASAL SEPTOPLASTY W/ TURBINOPLASTY N/A 07/13/2022   Procedure: Septoplasty with bilateral turbinate reduction;  Surgeon: Leta Baptist, MD;  Location: [facility];  Service: ENT;  Laterality: N/A;   POSTERIOR LUMBAR FUSION  12/03/2020   L4-5, L5-S1 (Dr. Vertell Limber)   SINUS ENDO W/FUSION Bilateral 07/13/2022   Procedure: Total Ethmoidectomy with Navigation;  Surgeon: Leta Baptist, MD;  Location: [facility];  Service: ENT;  Laterality: Bilateral;   TONSILLECTOMY AND ADENOIDECTOMY     age 47   TRANSTHORACIC ECHOCARDIOGRAM  02/22/2018   EF 55-60%, grd I DD. Aortic root dilation 41 mm.  05/2021 echo unchanged except aortic root dilation down to 39 mm. Plan rpt 1 yr to monitor aortic root dilation.   URETHRAL DILATION     2 as a child, one as an adult   VASECTOMY  1994   VITRECTOMY Left     Outpatient Medications Prior to Visit  Medication Sig Dispense Refill   atorvastatin (LIPITOR) 80 MG tablet Take 1 tablet (80 mg total) by mouth daily. 90 tablet 1    sodium chloride (OCEAN) 0.65 % nasal spray Place 1 spray into the nose daily as needed for congestion.     vitamin B-12 (CYANOCOBALAMIN) 1000 MCG tablet Take 2,000 mcg by mouth at bedtime.     alfuzosin (UROXATRAL) 10 MG 24 hr tablet Take 1 tablet (10 mg total) by mouth at bedtime. 30 tablet 11   Choline Fenofibrate (FENOFIBRIC ACID) 135 MG CPDR Take 1 capsule by mouth daily. 90 capsule 3   finasteride (PROSCAR) 5 MG tablet TAKE 1 TABLET(5 MG) BY MOUTH DAILY 90 tablet 3   insulin detemir (LEVEMIR FLEXTOUCH) 100 UNIT/ML FlexPen Inject 54 Units into the skin every evening. (Patient taking differently: Inject 40 Units into the skin every evening.) 15 mL 6   tadalafil (CIALIS) 5 MG tablet Take 1 tablet (5 mg total) by  mouth every morning. 30 tablet 11   tamsulosin (FLOMAX) 0.4 MG CAPS capsule Take 1 capsule (0.4 mg total) by mouth at bedtime. 90 capsule 3   azelastine (ASTELIN) 0.1 % nasal spray Place 2 sprays into both nostrils 2 (two) times daily. Use in each nostril as directed 30 mL 11   buPROPion (WELLBUTRIN SR) 150 MG 12 hr tablet TAKE 1 TABLET(150 MG) BY MOUTH TWICE DAILY 60 tablet 0   gabapentin (NEURONTIN) 300 MG capsule Take 3 capsules (900 mg total) by mouth at bedtime. 270 capsule 1   glucose blood (ONE TOUCH ULTRA TEST) test strip 1 each by Other route 3 (three) times daily. Test sugars up to three times daily. ( insurance preference  DX E11.65 E11.9) 100 each 11   Insulin Pen Needle (BD PEN NEEDLE NANO 2ND GEN) 32G X 4 MM MISC USE TO INJECT 4 TIMES DAILY 200 each 5   metFORMIN (GLUCOPHAGE) 1000 MG tablet TAKE 1 TABLET BY MOUTH TWICE DAILY WITH A MEAL 60 tablet 0   mirtazapine (REMERON) 15 MG tablet TAKE 1 TABLET BY MOUTH AT BEDTIME 90 tablet 0   OZEMPIC, 1 MG/DOSE, 4 MG/3ML SOPN INJECT 1 MG SUBCUTANEOUSLY ONCE A WEEK 3 mL 0   No facility-administered medications prior to visit.    Allergies  Allergen Reactions   Simvastatin Other (See Comments)    myalgias   Antihistamines,  Diphenhydramine-Type Other (See Comments)    depression   Codeine Nausea Only   Penicillins Hives   Sulfa Antibiotics Hives    ROS As per HPI  PE:    09/09/2022   10:20 AM 08/13/2022    8:00 AM 07/16/2022    8:00 AM  Vitals with BMI  Height '5\' 10"'$  '5\' 10"'$  '5\' 10"'$   Weight 252 lbs 3 oz 245 lbs 238 lbs  BMI 36.19 93.71 69.67  Systolic 893 810 175  Diastolic 73 73 66  Pulse 76 79 114     Physical Exam  Gen: Alert, well appearing.  Patient is oriented to person, place, time, and situation. AFFECT: pleasant, lucid thought and speech. Foot exam - no swelling, tenderness or skin or vascular lesions. Color and temperature is normal. Sensation is intact. Peripheral pulses are palpable. Toenails are normal.   LABS:  Last CBC Lab Results  Component Value Date   WBC 9.3 09/30/2021   HGB 12.8 (L) 09/30/2021   HCT 37.8 (L) 09/30/2021   MCV 87.1 09/30/2021   MCH 29.5 09/30/2021   RDW 13.1 09/30/2021   PLT 303 09/30/2021    Lab Results  Component Value Date   VITAMINB12 1,163 (H) 09/23/8526    Last metabolic panel Lab Results  Component Value Date   GLUCOSE 134 (H) 07/08/2022   NA 139 07/08/2022   K 4.1 07/08/2022   CL 105 07/08/2022   CO2 24 07/08/2022   BUN 20 07/08/2022   CREATININE 0.98 07/08/2022   GFRNONAA >60 07/08/2022   CALCIUM 9.8 07/08/2022   PROT 6.5 06/08/2022   ALBUMIN 4.4 06/08/2022   LABGLOB 2.3 09/15/2021   AGRATIO 2.0 09/15/2021   BILITOT 0.4 06/08/2022   ALKPHOS 32 (L) 06/08/2022   AST 12 06/08/2022   ALT 14 06/08/2022   ANIONGAP 10 07/08/2022   Last lipids Lab Results  Component Value Date   CHOL 129 06/08/2022   HDL 47.40 06/08/2022   LDLCALC 64 06/08/2022   LDLDIRECT 112.0 07/31/2016   TRIG 90.0 06/08/2022   CHOLHDL 3 06/08/2022   Last hemoglobin A1c Lab  Results  Component Value Date   HGBA1C 6.3 (A) 09/09/2022   HGBA1C 6.3 09/09/2022   HGBA1C 6.3 09/09/2022   HGBA1C 6.3 09/09/2022   Last thyroid functions Lab Results   Component Value Date   TSH 1.18 03/30/2022   T3TOTAL 108 02/14/2018   IMPRESSION AND PLAN:  #1 high stress, history of GAD and recurrent major depressive disorder. He is actually doing very well considering the acute life stressor of having to move to [place]. He feels like he has done well with his wean off of Lexapro since last visit. We will continue Wellbutrin SR 150 mg twice daily.  2.  Diabetes, excellent control. However, POC Hba1c today is 6.3%, up from 5.4% three months ago. I think he will get back on track after he gets settled in in [place]. Continue Levemir 40 units/day, metformin 1000 mg twice a day, and Ozempic 1 mg every 7 days.  #3 hyperlipidemia, mixed. Doing well on atorvastatin 80 mg a day and fenofibrate 135 mg a day. Lipid panel at goal 3 months ago. Hepatic panel has been normal.  #4 history of hypertension. Has been doing well now for quite a while off of antihypertensive.  An After Visit Summary was printed and given to the patient.  FOLLOW UP: Return for f/u as needed.  Pt relocating to [facility] soon..  Signed:  Crissie Sickles, MD           09/09/2022

## 2022-09-11 ENCOUNTER — Encounter: Payer: Self-pay | Admitting: Family Medicine

## 2022-09-11 NOTE — Telephone Encounter (Signed)
Yes, I agree. continue 54 units as long as your sugars are not going <80.

## 2022-09-15 DIAGNOSIS — Z23 Encounter for immunization: Secondary | ICD-10-CM | POA: Diagnosis not present

## 2022-09-21 ENCOUNTER — Ambulatory Visit (INDEPENDENT_AMBULATORY_CARE_PROVIDER_SITE_OTHER): Payer: Medicare Other | Admitting: Adult Health

## 2022-09-29 ENCOUNTER — Other Ambulatory Visit: Payer: Self-pay | Admitting: Family Medicine

## 2022-09-29 DIAGNOSIS — E1169 Type 2 diabetes mellitus with other specified complication: Secondary | ICD-10-CM

## 2022-10-09 ENCOUNTER — Encounter: Payer: Medicare Other | Admitting: Psychology

## 2022-10-27 ENCOUNTER — Encounter: Payer: Medicare Other | Admitting: Psychology

## 2022-11-09 ENCOUNTER — Other Ambulatory Visit: Payer: Self-pay

## 2022-11-09 MED ORDER — LEVEMIR FLEXTOUCH 100 UNIT/ML ~~LOC~~ SOPN: 40.0000 [IU] | PEN_INJECTOR | Freq: Every evening | SUBCUTANEOUS | 1 refills | Status: AC

## 2022-11-10 ENCOUNTER — Other Ambulatory Visit: Payer: Self-pay | Admitting: Family Medicine

## 2022-12-04 ENCOUNTER — Other Ambulatory Visit: Payer: Self-pay | Admitting: Family Medicine

## 2022-12-05 ENCOUNTER — Other Ambulatory Visit: Payer: Self-pay | Admitting: Urology

## 2022-12-09 ENCOUNTER — Encounter: Payer: Self-pay | Admitting: Family Medicine

## 2022-12-09 DIAGNOSIS — E1169 Type 2 diabetes mellitus with other specified complication: Secondary | ICD-10-CM

## 2022-12-09 MED ORDER — BUPROPION HCL ER (SR) 150 MG PO TB12: ORAL_TABLET | ORAL | 0 refills | Status: AC

## 2022-12-09 MED ORDER — OZEMPIC (1 MG/DOSE) 4 MG/3ML ~~LOC~~ SOPN: PEN_INJECTOR | SUBCUTANEOUS | 0 refills | Status: AC

## 2022-12-09 MED ORDER — METFORMIN HCL 1000 MG PO TABS: 1000.0000 mg | ORAL_TABLET | Freq: Two times a day (BID) | ORAL | 0 refills | Status: AC

## 2022-12-09 MED ORDER — TADALAFIL 5 MG PO TABS: 5.0000 mg | ORAL_TABLET | Freq: Every morning | ORAL | 0 refills | Status: AC

## 2022-12-16 ENCOUNTER — Ambulatory Visit: Payer: Medicare Other | Admitting: Urology

## 2022-12-31 ENCOUNTER — Encounter: Payer: Medicare Other | Admitting: Psychology

## 2023-01-08 ENCOUNTER — Encounter: Payer: Medicare Other | Admitting: Psychology

## 2023-01-14 ENCOUNTER — Ambulatory Visit: Admit: 2023-01-14 | Discharge: 2023-01-14 | Payer: MEDICARE | Attending: Family | Primary: Family

## 2023-01-14 DIAGNOSIS — Z8679 Personal history of other diseases of the circulatory system: Secondary | ICD-10-CM

## 2023-01-14 NOTE — Progress Notes (Unsigned)
PROGRESS NOTE      Chief Complaint   Patient presents with    Establish Care       HPI    New patient: PCP in [place] 08/2022    History of hypertension: Stopped antihypertensives with lifestyle change and weight loss.    Type 2 diabetes:  Metformin 1000 mg twice daily, Levemir 40unit. Previously out of Ozempic 1 mg 1 month ago due to expense. Last A1C 7% 08/2022.Admits poor diet choices and less active recently    Obesity: 14 pound weight gain. BMI over 35    Hyperlipidemia: Lipitor 80 mg and fenofibrate 135 mg    Anxiety and depression: Wellbutrin sr 150 mg twice daily, Remeron 15 mg at bedtime (insomnia). Previously followed by psychiatry and psychology.    BPH with decreased urine stream:Followed by urology. Proscar, Cialis 5 mg, Uroxatral daily with decreased nocturia. Most recently more difficulty urinating.     History of kidney stones: S/p retrieval and stent placement    Chronic back pain: surgery x2. Artificial discs placed    Restless leg syndrome: gabapentin 300 mg 3 tablets at bedtime with benefit    History of colon polyps: 5 year recall due 01/2023          Past Medical History, Past Surgical History, Family history, Social History, and Medications were all reviewed and updated as necessary.     Current Outpatient Medications   Medication Sig Dispense Refill    atorvastatin (LIPITOR) 80 MG tablet Take 1 tablet by mouth daily      buPROPion (WELLBUTRIN SR) 150 MG extended release tablet Take 1 tablet by mouth 2 times daily      fenofibric acid (TRILIPIX) 135 MG CPDR capsule Take 1 capsule by mouth daily      gabapentin (NEURONTIN) 300 MG capsule TAKE 3 CAPSULES BY MOUTH AT BEDTIME      LEVEMIR FLEXPEN 100 UNIT/ML injection pen INJECT 40 UNITS SUBCUTANEOUSLY IN THE EVENING      metFORMIN (GLUCOPHAGE) 1000 MG tablet Take 1 tablet by mouth 2 times daily (with meals)      mirtazapine (REMERON) 15 MG tablet Take 1 tablet by mouth nightly at bedtime.      OZEMPIC, 1 MG/DOSE, 4 MG/3ML SOPN INJECT 1 MG  SUBCUTANEOUSLY ONCE A WEEK      tadalafil (CIALIS) 5 MG tablet Take 1 tablet by mouth every morning      finasteride (PROSCAR) 5 MG tablet Take 1 tablet by mouth daily      alfuzosin (UROXATRAL) 10 MG extended release tablet Take 1 tablet by mouth daily At bedtime      azelastine (ASTELIN) 0.1 % nasal spray 2 sprays by Nasal route 2 times daily Use in each nostril as directed      blood glucose test strips (ASCENSIA AUTODISC VI;ONE TOUCH ULTRA TEST VI) strip 1 each daily As needed.      Lancets Ultra Fine MISC by Does not apply route      sodium chloride (OCEAN) 0.65 % nasal spray 1 spray by Nasal route as needed for Congestion OTC as directed      Cyanocobalamin (VITAMIN B 12 PO) Take 2,000 mcg by mouth OTC as directed      aspirin 81 MG EC tablet Take 1 tablet by mouth daily      Omega-3 Fatty Acids (OMEGA 3 PO) Take 500 mcg by mouth OTC as directed      NONFORMULARY OTC Primal labs sleep refine tablets  as directed  No current facility-administered medications for this visit.     Allergies   Allergen Reactions    Penicillins Hives    Sulfa Antibiotics Hives    Codeine Nausea And Vomiting       ASSESSMENT and PLAN    Lee Page was seen today for establish care.    Diagnoses and all orders for this visit:    History of hypertension    At high risk for falls    Pure hypercholesterolemia  -     Comprehensive Metabolic Panel; Future  -     Lipid Panel; Future  -     TSH; Future    Benign prostatic hyperplasia with weak urinary stream  -     [facility], [name]  -     Urinalysis, Micro; Future    Type 2 diabetes mellitus without complication, without long-term current use of insulin (HCC)  -     Hemoglobin A1C; Future  -     Microalbumin / Creatinine Urine Ratio; Future  -     Protein / creatinine ratio, urine; Future    Severe obesity (BMI 35.0-35.9 with comorbidity) (HCC)    Anxiety    Chronic bilateral low back pain without sciatica  -     External Referral to Physical  Therapy    Restless leg syndrome  -     CBC with Auto Differential; Future    Balance problems  -     External Referral to Physical Therapy    History of kidney stones    Current mild episode of major depressive disorder, unspecified whether recurrent (HCC)    History of colon polyps  -     [facility] - Barbara Cower, MD, Gastroenterology, [facility]        Medical problems and test results were reviewed with the patient today.     FOLLOW UP    Return for 2 months follow up with fasting labs prior.       REVIEW OF SYSTEMS    Review of Systems    PHYSICAL EXAM    BP (!) 140/80 (Site: Left Upper Arm, Position: Sitting)   Pulse 95   Ht 1.778 m (5\' 10" )   Wt 111.6 kg (246 lb)   SpO2 95%   BMI 35.30 kg/m      Physical Exam

## 2023-01-15 ENCOUNTER — Encounter

## 2023-01-15 MED ORDER — ALFUZOSIN HCL ER 10 MG PO TB24
10 | ORAL_TABLET | Freq: Every evening | ORAL | 0 refills | Status: DC
Start: 2023-01-15 — End: 2023-02-11

## 2023-01-15 MED ORDER — INSULIN DETEMIR 100 UNIT/ML SC SOPN
100 UNIT/ML | Freq: Every day | SUBCUTANEOUS | 0 refills | Status: DC
Start: 2023-01-15 — End: 2023-03-18

## 2023-01-15 MED ORDER — ATORVASTATIN CALCIUM 80 MG PO TABS
80 MG | ORAL_TABLET | Freq: Every day | ORAL | 0 refills | Status: AC
Start: 2023-01-15 — End: 2023-02-06

## 2023-01-15 MED ORDER — FINASTERIDE 5 MG PO TABS
5 MG | ORAL_TABLET | Freq: Every day | ORAL | 0 refills | Status: DC
Start: 2023-01-15 — End: 2023-03-18

## 2023-01-15 NOTE — Telephone Encounter (Signed)
Pt next appt is 03/18/23. Rx's pended for [place].

## 2023-01-15 NOTE — Telephone Encounter (Signed)
From: Margarita Rana  To: Stefanie Libel  Sent: 01/15/2023 10:31 AM EST  Subject: Lee Page, at the end of my appointment with you yesterday, I neglected to ask for needed refills of my medications previously prescribed by my physician in [place]:    1. Levemir FlexPen Inj NOV, inject 40units in evening  2. Alfuzosin HCL ER 74m tab, 1 tablet at bedtime  3. Atorvastatin 856mtab TEV, 1 tablet daily  4. Finasteride 71m21mab, 1 tablet daily     [facility]  [facility] [phone] I expect that before my next appointment with you in April that it will be necessary for me to request refills on other medications previously prescribed by my physician in [place]

## 2023-01-21 NOTE — Telephone Encounter (Signed)
Received a fax from [facility] in [place], [place] ([facility]) stating that the LEVEMIR Flexpen 100 unit/mL will not continue to pay for this drug after the patient has received the maximum 30 days temporary supply that they are required to cover. According to the letter, Lee Page will have to prescribe an alternate medication that would be covered under the patient's insurance. Placed the faxed information in Natalie's inbox to be scanned into patient's chart.

## 2023-02-04 ENCOUNTER — Encounter

## 2023-02-04 NOTE — Telephone Encounter (Signed)
Left v/m for pt to cb in regards to rx refill request.

## 2023-02-06 MED ORDER — ATORVASTATIN CALCIUM 80 MG PO TABS
80 | ORAL_TABLET | Freq: Every day | ORAL | 0 refills | Status: DC
Start: 2023-02-06 — End: 2023-07-26

## 2023-02-06 MED ORDER — TADALAFIL 5 MG PO TABS
5 MG | ORAL_TABLET | Freq: Every morning | ORAL | 3 refills | Status: DC
Start: 2023-02-06 — End: 2023-10-07

## 2023-02-06 NOTE — Telephone Encounter (Signed)
From: Margarita Rana  To: Stefanie Libel  Sent: 02/04/2023 8:13 AM EST  Subject: Refills of my previous [facility] prescriptions    Lanelle Bal, I need the following medications refilled prior to my appointment with you:    TAMSULOSIN 0.'4MG'$  CAP ZYD, 1 capsule nightly    TADALAFIL '5MG'$  TAB TOR, 1 tablet each morning    ATORVASTATIN '80MG'$  TAB TEV, 1 tablet daily  I had asked in my prior message to have this refilled, and I know that you had approved it, but the pharmacy never called that they had it ready. I have no idea what the problem is on their end; I now have only a week left of the medication. Could you please call it in again?    Thank you. See you soon.

## 2023-02-06 NOTE — Telephone Encounter (Signed)
Please message patient: I sent in prescription for Cialis 5 mg and Lipitor 80 mg. It appears he is taking Uroxatral and should not be taking the Flomax (tamsulosin)  with this. Please clarify which medication he is taking. Thanks

## 2023-02-09 ENCOUNTER — Encounter: Admit: 2023-02-09 | Discharge: 2023-02-09 | Payer: MEDICARE | Attending: Urology | Primary: Family

## 2023-02-09 DIAGNOSIS — N401 Enlarged prostate with lower urinary tract symptoms: Secondary | ICD-10-CM

## 2023-02-09 LAB — AMB POC URINALYSIS DIP STICK AUTO W/O MICRO
Bilirubin, Urine, POC: NEGATIVE
Blood (UA POC): NEGATIVE
Glucose, Urine, POC: NEGATIVE
KETONES, Urine, POC: NEGATIVE
Leukocyte Esterase, Urine, POC: NEGATIVE
Nitrite, Urine, POC: NEGATIVE
Protein, Urine, POC: NEGATIVE
Specific Gravity, Urine, POC: 1.025 (ref 1.001–1.035)
Urobilinogen, POC: 1
pH, Urine, POC: 6 (ref 4.6–8.0)

## 2023-02-09 LAB — AMB POC PVR, MEAS,POST-VOID RES,US,NON-IMAGING: PVR, POC: 165 cc

## 2023-02-09 LAB — PSA, DIAGNOSTIC: PSA: <0.1 ng/mL (ref <4.0)

## 2023-02-09 NOTE — Progress Notes (Signed)
[facility] [place] [address]  [phone]    Lee Page  DOB: 09-19-1951      Chief Complaint   Patient presents with    Benign Prostatic Hypertrophy        HPI   72 y.o., male who is referred by Dr. Humberto Leep for evaluation of BPH.  Pt reports decreased stream and freq with nocturia times one on Proscar, Uroxatral and Cialis '5mg'$  qd.  Denies UTI's or hematuria.  PVR today is 165cc by ultrasound.   Recently moved to area from [place].  Followed by urologist there (Dr. Alyson Ingles).  No records available at this time.  No recent PSA.  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  Originally from [place].      Past Medical History:   Diagnosis Date    Anxiety     Depression     Erectile dysfunction     Hypertension     Neuropathy     Obesity     Restless legs syndrome     Sleep apnea     Type 2 diabetes mellitus without complication [facility])      Past Surgical History:   Procedure Laterality Date    APPENDECTOMY  1974?    EYE SURGERY  2000?    Right eye cornea transplant    TONSILLECTOMY  1960?     Current Outpatient Medications   Medication Sig Dispense Refill    atorvastatin (LIPITOR) 80 MG tablet Take 1 tablet by mouth daily 90 tablet 0    tadalafil (CIALIS) 5 MG tablet Take 1 tablet by mouth every morning 90 tablet 3    alfuzosin (UROXATRAL) 10 MG extended release tablet Take 1 tablet by mouth nightly 90 tablet 0    finasteride (PROSCAR) 5 MG tablet Take 1 tablet by mouth daily 90 tablet 0    insulin detemir (LEVEMIR FLEXPEN) 100 UNIT/ML injection pen Inject 40 Units into the skin Daily 36 mL 0    buPROPion (WELLBUTRIN SR) 150 MG extended release tablet Take 1 tablet by mouth 2 times daily      fenofibric acid (TRILIPIX) 135 MG CPDR capsule Take 1 capsule by mouth daily      gabapentin (NEURONTIN) 300 MG capsule TAKE 3 CAPSULES BY MOUTH AT BEDTIME      metFORMIN (GLUCOPHAGE) 1000 MG tablet Take 1 tablet by mouth 2 times daily (with  meals)      mirtazapine (REMERON) 15 MG tablet Take 1 tablet by mouth nightly at bedtime.      OZEMPIC, 1 MG/DOSE, 4 MG/3ML SOPN INJECT 1 MG SUBCUTANEOUSLY ONCE A WEEK      azelastine (ASTELIN) 0.1 % nasal spray 2 sprays by Nasal route 2 times daily Use in each nostril as directed      blood glucose test strips (ASCENSIA AUTODISC VI;ONE TOUCH ULTRA TEST VI) strip 1 each daily As needed.      Lancets Ultra Fine MISC by Does not apply route      sodium chloride (OCEAN) 0.65 % nasal spray 1 spray by Nasal route as needed for Congestion OTC as directed      Cyanocobalamin (VITAMIN B 12 PO) Take 2,000 mcg by mouth OTC as directed      aspirin 81 MG EC tablet Take 1 tablet by mouth daily      Omega-3 Fatty Acids (OMEGA 3 PO) Take 500 mcg by mouth OTC as directed      NONFORMULARY  OTC Primal labs sleep refine tablets  as directed       No current facility-administered medications for this visit.     Allergies   Allergen Reactions    Penicillins Hives    Sulfa Antibiotics Hives    Codeine Nausea And Vomiting     Social History     Socioeconomic History    Marital status: Married     Spouse name: Not on file    Number of children: Not on file    Years of education: Not on file    Highest education level: Not on file   Occupational History    Not on file   Tobacco Use    Smoking status: Passive Smoke Exposure - Never Smoker     Passive exposure: Never    Smokeless tobacco: Never    Tobacco comments:     Sporadic tobacco use (1-4x/yr) between [id number]?   Vaping Use    Vaping Use: Never used   Substance and Sexual Activity    Alcohol use: Yes     Comment: Only about 1-3 drinks (various types) per month on average    Drug use: Never    Sexual activity: Not Currently     Partners: Female   Other Topics Concern    Not on file   Social History Narrative    Not on file     Social Determinants of Health     Financial Resource Strain: Low Risk  (01/14/2023)    Overall Financial Resource Strain (CARDIA)     Difficulty of Paying Living  Expenses: Not hard at all   Food Insecurity: No Food Insecurity (01/14/2023)    Hunger Vital Sign     Worried About Running Out of Food in the Last Year: Never true     [place] in the Last Year: Never true   Transportation Needs: Unknown (01/14/2023)    PRAPARE - Armed forces logistics/support/administrative officer (Medical): Not on file     Lack of Transportation (Non-Medical): No   Physical Activity: Unknown (01/12/2023)    Exercise Vital Sign     Days of Exercise per Week: 0 days     Minutes of Exercise per Session: Not on file   Stress: Not on file   Social Connections: Not on file   Intimate Partner Violence: Not on file   Housing Stability: Unknown (01/14/2023)    Housing Stability Vital Sign     Unable to Pay for Housing in the Last Year: Not on file     Number of Places Lived in the Last Year: Not on file     Unstable Housing in the Last Year: No     Family History   Problem Relation Age of Onset    Depression Mother     Diabetes Father         Type II    Heart Disease Father     High Blood Pressure Father     High Cholesterol Father     Obesity Father     Alcohol Abuse Sister     Depression Sister     Obesity Sister     Hearing Loss Sister        Review of Systems  Constitutional: Positive for appetite change, malaise/fatigue and headaches.  Negative for fever, chills and weight loss.  Skin: Positive for skin lesions. Negative for rash and itching.  Eyes:  Negative for visual disturbance, eye pain and eye discharge.  ENT: HENT negativePositive for high frequency hearing loss. Negative for difficulty articulating words, pain swallowing and dry mouth.  Respiratory: Positive for cough, shortness of breath and wheezing. Negative for blood in sputum.  Cardiovascular: Positive for hypertension, irregular heartbeat, leg pain and leg swelling. Negative for chest pain, regular rate and rhythm and varicose veins.  GI: Positive for abdominal pain, indigestion and heartburn. Negative for nausea, vomiting, blood in stool,  constipation and diarrhea.  Genitourinary: Positive for urinary burning, history of urolithiasis, nocturia, slower stream, straining, incomplete emptying, erectile dysfunction and urethral stricture. Negative for hematuria, flank pain, recurrent UTIs, urgency, leakage w/ urge, frequent urination, testicular pain, sexually transmitted disease, sexually transmitted disease, discharge, menstrual problem, endometriosis, vaginal pain and hysterectomy.  Musculoskeletal: Positive for back pain, arthralgias, muscle weakness and neck pain. Negative for bone pain and tenderness.  Neurological: Positive for dizziness, focal weakness, numbness and tremors. Negative for seizures.  Psychological:  Negative for depression and psychiatric problem.  Endocrine: Positive for fatigue. Negative for cold intolerance, thirst and excessive urination.  Hem/Lymphatic:  Negative for easy bleeding, easy bruising and frequent infections.        Physical Exam  There were no vitals taken for this visit.  General appearance - alert, well appearing, and in no distress  Mental status - alert, oriented to person, place, and time  Eyes - extraocular eye movements intact, sclera anicteric  Nose - normal and patent, no erythema, discharge or polyps  Mouth - mucous membranes moist  Abdomen - soft, nontender, nondistended, no masses or organomegaly  GU -  Testes normal to palpation without mass.  Phallus normal without mass or lesion present  Rectal - normal rectal tone, anodular prostate  Lymphatic-  No palpable lymphadenopathy  Neurological -  normal speech, no focal findings or movement disorder noted  Musculoskeletal - no deformity or swelling  Extremities - no pedal edema, no clubbing or cyanosis  Skin - normal coloration and turgor      Urinalysis  UA - Dipstick  Results for orders placed or performed in visit on 02/09/23   AMB POC URINALYSIS DIP STICK AUTO W/O MICRO   Result Value Ref Range    Color (UA POC)      Clarity (UA POC)      Glucose,  Urine, POC Negative     Bilirubin, Urine, POC Negative     KETONES, Urine, POC Negative     Specific Gravity, Urine, POC 1.025 1.001 - 1.035    Blood (UA POC) Negative     pH, Urine, POC 6.0 4.6 - 8.0    Protein, Urine, POC Negative     Urobilinogen, POC 1 mg/dL     Nitrite, Urine, POC Negative     Leukocyte Esterase, Urine, POC Negative    AMB POC PVR, MEAS,POST-VOID RES,US,NON-IMAGING   Result Value Ref Range    PVR, POC 165 cc         UA - Micro  WBC - 0  RBC - 0  Bacteria - 0  Epith - 0    Assessment/Plan    ICD-10-CM    1. Benign prostatic hyperplasia with weak urinary stream  N40.1 AMB POC URINALYSIS DIP STICK AUTO W/O MICRO    R39.12 AMB POC PVR, MEAS,POST-VOID RES,US,NON-IMAGING     PSA, Diagnostic     PSA, Diagnostic        Cont Proscar/Cialis/Uroxatral.  PSA drawn today.  Surgical tx options reviewed.  He elected to cont meds.  RTO in 6  mo to reassess.  Reviewed option for cysto if bother worsens or he develops UTI's.    Dmya Long P Yoshiharu Brassell, DO    Total time for today's encounter including chart review, result review, documentation and face to face encounter was 46 minutes.

## 2023-02-11 MED ORDER — TAMSULOSIN HCL 0.4 MG PO CAPS
0.4 MG | ORAL_CAPSULE | Freq: Every day | ORAL | 3 refills | Status: DC
Start: 2023-02-11 — End: 2023-12-23

## 2023-02-11 NOTE — Telephone Encounter (Signed)
Please advise. Pt states he is not taking Uroxatral. Said he runs out of his Tamsulosin tomorrow.

## 2023-02-11 NOTE — Telephone Encounter (Signed)
Refill for tamsulosin sent to pharmacy.  Uroxatrol taken out medication list.

## 2023-02-12 NOTE — Telephone Encounter (Signed)
Called & left v/m informing pt that rx for Tamsulosin has been sent to pharmacy.

## 2023-02-15 NOTE — Telephone Encounter (Signed)
From: Margarita Rana  To: Stefanie Libel  Sent: 02/14/2023 11:22 PM EDT  Subject: Discoloration on right shin    I don't know whether to be concerned about this or not. Late this evening, I noticed a large purple discoloration on my right shin, about 6 inches above the ankle area. I do not recall running into anything against this area and it does not hurt. I did fall about 2 weeks ago, abrading my left knee which has scabbed and is healing, it appears to me, normally. In this same fall, I also fell on my right knee, not as hard and no abrasion; but earlier this week I have noticed what appears to be a rash or bruising just below my right knee and in my calf. These areas were somewhat tender but lessening with time.    Do I need to be concerned, should I do anything?

## 2023-02-15 NOTE — Telephone Encounter (Signed)
Please advise.

## 2023-02-15 NOTE — Telephone Encounter (Signed)
Hard to say without evaluating. If on shin,less worrisome than if on calf but really cannot say without seeing. Could try work him in or could go to urgent care.

## 2023-02-16 ENCOUNTER — Encounter

## 2023-02-17 NOTE — Progress Notes (Signed)
Patient verified name, DOB, and procedure.    Type: 1a; abbreviated assessment per anesthesia guidelines.    Labs ordered per anesthesia: POCT Glucose    Instructed pt that they will be notified the day before their procedure by the GI Lab of the time of arrival if their procedure is [facility] and by Pre-op for [facility] procedures. Arrival times should be called by 5 pm. If no phone call is received, the patient should contact the respective hospital. The GI Lab telephone number is [phone] and [facility] Pre-Op is [phone].     Instructed pt to follow diet and prep instructions, per MD, including NPO status. If patient has NOT received instructions from office, patient advised to call the MD's office as soon as possible.     Pt may bathe or shower the night before and the am of surgery with non-moisturizing soap. No lotions, oils, powders, make-up or colognes/perfumes on skin. No jewelry. Wear loose-fitting, comfortable, clean clothing.     Pt must have adult present in building the entire time that can drive them home.     Medications to take on the day of procedure: Gabapentin, Wellbutrin, and 80% (32 units) of Levemir insulin the night before the procedure.    Medications to hold: ASA, Metformin, and Vitamins/Supplements, per anesthesia guidelines.     The following discharge instructions were reviewed with patient: medication given during procedure may cause drowsiness for several hours, therefore, do not drive or operate machinery for remainder of the day. You may not drink alcohol on the day of your procedure, please resume regular diet and activity unless otherwise directed. You may experience abdominal distention for several hours that is relieved by the passage of gas. Contact your physician if you have any of the following: fever or chills, severe abdominal pain, or excessive amount of bleeding or a large amount when having a bowel movement. Occasional specks of blood with bowel movement would not be  unusual.    Opportunity for questions and clarification was provided. Pt verbalizes understanding.

## 2023-02-18 ENCOUNTER — Ambulatory Visit: Payer: Medicare Other | Admitting: Adult Health

## 2023-02-23 NOTE — Progress Notes (Signed)
Spoke with pt regarding tomorrow's procedure. Pt verbalized understanding of 0930 arrival time as well as driver policy.

## 2023-02-24 ENCOUNTER — Inpatient Hospital Stay: Payer: MEDICARE | Attending: Student in an Organized Health Care Education/Training Program

## 2023-02-24 LAB — POCT GLUCOSE: POC Glucose: 170 mg/dL — ABNORMAL HIGH (ref 65–100)

## 2023-02-24 MED ORDER — PROPOFOL 200 MG/20ML IV EMUL
200 MG/20ML | INTRAVENOUS | Status: DC | PRN
  Administered 2023-02-24: 14:00:00 60 via INTRAVENOUS
  Administered 2023-02-24: 14:00:00 200 via INTRAVENOUS

## 2023-02-24 MED ORDER — NORMAL SALINE FLUSH 0.9 % IV SOLN
0.9 | Freq: Two times a day (BID) | INTRAVENOUS | Status: DC
Start: 2023-02-24 — End: 2023-02-24

## 2023-02-24 MED ORDER — NORMAL SALINE FLUSH 0.9 % IV SOLN
0.9 | INTRAVENOUS | Status: DC | PRN
Start: 2023-02-24 — End: 2023-02-24

## 2023-02-24 MED ORDER — SODIUM CHLORIDE 0.9 % IV SOLN
0.9 | INTRAVENOUS | Status: DC | PRN
Start: 2023-02-24 — End: 2023-02-24

## 2023-02-24 MED ORDER — LIDOCAINE HCL 1 % IJ SOLN
1 | Freq: Once | INTRAMUSCULAR | Status: DC | PRN
Start: 2023-02-24 — End: 2023-02-24

## 2023-02-24 MED ORDER — LACTATED RINGERS IV SOLN
INTRAVENOUS | Status: DC
Start: 2023-02-24 — End: 2023-02-24
  Administered 2023-02-24: 14:00:00 via INTRAVENOUS

## 2023-02-24 NOTE — Op Note (Signed)
Operative Report    Patient: Lee Page MRN: [id number]      Date of Birth: 1951/04/28  Age: 72 y.o.  Sex: male            Indications:  Screening    Preoperative Evaluation: The patient was evaluated prior to the procedure in the GI lab admission area, the patient ASA was recorded .  Consent was obtained from the patient with the risk of perforation bleeding and aspiration.    Anesthesia: IVA-per anesthesia    Complications: None; patient tolerated the procedure well.    EBL -insignificant      Procedure: The patient was sedated in the left lateral decubitus position.  Scope was advanced from the rectum to the cecum.  Per gastroenterology society guideline recommendations, right side of the colon was examined twice. The scope was withdrawn to the rectum, retroflexed view was performed.  The rectal exam was normal.  Preparation was adequate. Boston score of 9 .    Findings:    2 polyps in the cecum, 3-4 mm in size, removed via biopsy forceps.  Sigmoid diverticulosis.  Internal hemorrhoids.     Postoperative Diagnosis:   2 polyps  Diverticulosis.    Recommendations:   Interval of the next colonoscopy will be determined by pending pathology, likely 5 years)  Await for biopsy results, you will receive a pathology letter within 2 weeks.     Signed By:  Benedetto Coons, MD     February 24, 2023

## 2023-02-24 NOTE — H&P (Signed)
Lee Page is 72 y.o. y/o male here for screening colonoscopy.    No FH of colon cancer, no acute symptoms.     Past Medical History:   Diagnosis Date    Anxiety     Depression     Erectile dysfunction     Hypertension     Neuropathy     Obesity     Restless legs syndrome     Sleep apnea     Type 2 diabetes mellitus without complication [facility])      Past Surgical History:   Procedure Laterality Date    APPENDECTOMY  1974?    EYE SURGERY  2000?    Right eye cornea transplant    TONSILLECTOMY  1960?     Family History   Problem Relation Age of Onset    Depression Mother     Diabetes Father         Type II    Heart Disease Father     High Blood Pressure Father     High Cholesterol Father     Obesity Father     Alcohol Abuse Sister     Depression Sister     Obesity Sister     Hearing Loss Sister      Social History     Tobacco Use    Smoking status: Passive Smoke Exposure - Never Smoker     Passive exposure: Never    Smokeless tobacco: Never    Tobacco comments:     Sporadic tobacco use (1-4x/yr) between [id number]?   Vaping Use    Vaping Use: Never used   Substance Use Topics    Alcohol use: Yes     Comment: Only about 1-3 drinks (various types) per month on average    Drug use: Never     Allergies   Allergen Reactions    Antihistamines, Diphenhydramine-Type Other (See Comments)     Causes depression    Penicillins Hives    Sulfa Antibiotics Hives    Codeine Nausea And Vomiting     Current Outpatient Medications   Medication Instructions    aspirin 81 mg, Oral, DAILY    atorvastatin (LIPITOR) 80 mg, Oral, DAILY    azelastine (ASTELIN) 0.1 % nasal spray 2 sprays, Nasal, 2 TIMES DAILY, Use in each nostril as directed    blood glucose test strips (ASCENSIA AUTODISC VI;ONE TOUCH ULTRA TEST VI) strip 1 each, DAILY, As needed.    buPROPion (WELLBUTRIN SR) 150 mg, Oral, 2 TIMES DAILY    Cyanocobalamin (VITAMIN B 12 PO) 2,000 mcg, Oral, OTC as directed     fenofibric acid (TRILIPIX) 135 MG CPDR capsule 1 capsule,  Oral, DAILY    finasteride (PROSCAR) 5 mg, Oral, DAILY    gabapentin (NEURONTIN) 300 MG capsule TAKE 3 CAPSULES BY MOUTH AT BEDTIME    insulin detemir (LEVEMIR FLEXPEN) 40 Units, SubCUTAneous, DAILY    Lancets Ultra Fine MISC Does not apply    metFORMIN (GLUCOPHAGE) 1,000 mg, Oral, 2 TIMES DAILY WITH MEALS    mirtazapine (REMERON) 15 mg, Oral, NIGHTLY, at bedtime.    NONFORMULARY OTC Primal labs sleep refine tablets  as directed     Omega-3 Fatty Acids (OMEGA 3 PO) 500 mcg, Oral, OTC as directed     sodium chloride (OCEAN) 0.65 % nasal spray 1 spray, Nasal, PRN, OTC as directed     tadalafil (CIALIS) 5 mg, Oral, EVERY MORNING    tamsulosin (FLOMAX) 0.4 mg, Oral, DAILY  Review of Systems    ROS:    A complete 11 system ROS was performed and was negative aside from the pertinent negative and positives noted above.     PE:   There were no vitals filed for this visit.   General:  The patient appears well-nourished, and is in no acute distress.    Skin:  no rash, ulcers. No Bleeding or signs of infection.  HEENT:  Normocephalic, atraumatic. No sclerae icterus.   Neck:  No pain on palpation or mobilization of the neck.  Respiratory: Respiratory effort is normal. Expansion maintained bilaterally and symmetrically. Normal breath sounds and clear to auscultation bilaterally without wheezes, rales, or rhonchi.    Cardiovascular:  Regular rate and rhythm.     Abdomen:  Soft, non tender to palpation. No distention. Normoactive bowel sounds present.    Extremities: No edema bilaterally. No erythema  Neurologic:  Alert and oriented x3.  No sensory deficits. No asterixis  Psychiatric: Appropriate mood and affect.  Musculoskeletal: Strength and tone are symmetrical and maintained.    Benedetto Coons, MD  [facility]

## 2023-02-24 NOTE — Discharge Instructions (Addendum)
Gastrointestinal Colonoscopy/Flexible Sigmoidoscopy - Lower Exam Discharge Instructions  Call Dr. Derrek Monaco at [phone] for any problems or questions.  Contact the doctor's office for follow up appointment as directed  Medication may cause drowsiness for several hours, therefore, do not drive or operate machinery for remainder of the day.  No alcohol today.  Ordinarily, you may resume regular diet and activity after exam unless otherwise specified by your physician.  Because of air put into your colon during exam, you may experience some abdominal distension, relieved by the passage of gas, for several hours.  Contact your physician if you have any of the following:  Excessive amount of bleeding - large amount when having a bowel movement.  Occasional specks of blood with bowel movement would not be unusual.  Severe abdominal pain  Fever or Chills  Any additional instructions:   Recommendations:   Interval of the next colonoscopy will be determined by pending pathology, likely 5 years)  Await for biopsy results, you will receive a pathology letter within 2 weeks.

## 2023-02-24 NOTE — Anesthesia Post-Procedure Evaluation (Signed)
Department of Anesthesiology  Postprocedure Note    Patient: Lee Page  MRN: [id number]  Birthdate: 26-Mar-1951  Date of evaluation: 02/24/2023    Procedure Summary       Date: 02/24/23 Room / Location: [facility] 03 / [facility]    Anesthesia Start: 1005 Anesthesia Stop: [id number]    Procedure: COLONOSCOPY POLYPECTOMY SNARE/COLD BIOPSY (Lower GI Region) Diagnosis:       Encounter for screening colonoscopy      (Encounter for screening colonoscopy [Z12.11])    Surgeons: Benedetto Coons, MD Responsible Provider: Ann Maki, MD    Anesthesia Type: TIVA ASA Status: 2            Anesthesia Type: No value filed.    Aldrete Phase I: Aldrete Score: 10    Aldrete Phase II: Aldrete Score: 10    Anesthesia Post Evaluation    Patient location during evaluation: bedside  Patient participation: complete - patient participated  Level of consciousness: awake and alert  Airway patency: patent  Nausea & Vomiting: no vomiting  Cardiovascular status: hemodynamically stable  Respiratory status: acceptable  Hydration status: euvolemic  Pain management: adequate    No notable events documented.

## 2023-02-24 NOTE — Progress Notes (Signed)
1109-To lobby via wheelchair accompanied by staff. Discharged to home in good condition via private vehicle.

## 2023-02-24 NOTE — Anesthesia Pre-Procedure Evaluation (Signed)
Department of Anesthesiology  Preprocedure Note       Name:  Lee Page   Age:  72 y.o.  DOB:  08/08/1951                                          MRN:  [id number]         Date:  02/24/2023      Surgeon: Juliann Mule):  Derrek Monaco Burney Gauze, MD    Procedure: Procedure(s):  COLORECTAL CANCER SCREENING, NOT HIGH RISK    Medications prior to admission:   Prior to Admission medications    Medication Sig Start Date End Date Taking? Authorizing Provider   tamsulosin (FLOMAX) 0.4 MG capsule Take 1 capsule by mouth daily 02/11/23   Lovell-Sherman, Jeanett Schlein, APRN - CNP   atorvastatin (LIPITOR) 80 MG tablet Take 1 tablet by mouth daily 02/06/23   Lovell-Sherman, Jeanett Schlein, APRN - CNP   tadalafil (CIALIS) 5 MG tablet Take 1 tablet by mouth every morning 02/06/23   Lovell-Sherman, Jeanett Schlein, APRN - CNP   finasteride (PROSCAR) 5 MG tablet Take 1 tablet by mouth daily 01/15/23   Lovell-Sherman, Jeanett Schlein, APRN - CNP   insulin detemir (LEVEMIR FLEXPEN) 100 UNIT/ML injection pen Inject 40 Units into the skin Daily 01/15/23   Lovell-Sherman, Jeanett Schlein, APRN - CNP   buPROPion [facility] SR) 150 MG extended release tablet Take 1 tablet by mouth 2 times daily 12/09/22   [provider]   fenofibric acid (TRILIPIX) 135 MG CPDR capsule Take 1 capsule by mouth daily 12/01/22   [provider]   gabapentin (NEURONTIN) 300 MG capsule TAKE 3 CAPSULES BY MOUTH AT BEDTIME 12/10/22   [provider]   metFORMIN (GLUCOPHAGE) 1000 MG tablet Take 1 tablet by mouth 2 times daily (with meals) 12/09/22   [provider]   mirtazapine (REMERON) 15 MG tablet Take 1 tablet by mouth nightly at bedtime. 10/27/22   [provider]   azelastine (ASTELIN) 0.1 % nasal spray 2 sprays by Nasal route 2 times daily Use in each nostril as directed    [provider]   blood glucose test strips (ASCENSIA AUTODISC VI;ONE TOUCH ULTRA TEST VI) strip 1 each daily As needed.    [provider]   Lancets Ultra Fine  MISC by Does not apply route    [provider]   sodium chloride (OCEAN) 0.65 % nasal spray 1 spray by Nasal route as needed for Congestion OTC as directed    [provider]   Cyanocobalamin (VITAMIN B 12 PO) Take 2,000 mcg by mouth OTC as directed    [provider]   aspirin 81 MG EC tablet Take 1 tablet by mouth daily    [provider]   Omega-3 Fatty Acids (OMEGA 3 PO) Take 500 mcg by mouth OTC as directed    [provider]   NONFORMULARY OTC Primal labs sleep refine tablets  as directed    [provider]       Current medications:    No current facility-administered medications for this encounter.       Allergies:    Allergies   Allergen Reactions   . Antihistamines, Diphenhydramine-Type Other (See Comments)     Causes depression   . Penicillins Hives   . Sulfa Antibiotics Hives   . Codeine Nausea And Vomiting  Problem List:    Patient Active Problem List   Diagnosis Code   . History of kidney stones Z87.442   . Balance problems R26.89   . Restless leg syndrome G25.81   . Anxiety F41.9   . Chronic bilateral low back pain without sciatica M54.50, G89.29   . 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 Severe obesity (BMI 35.0-35.9 with comorbidity) (HCC) E66.01, Z68.35   . History of hypertension Z86.79   . Pure hypercholesterolemia E78.00   . Type 2 diabetes mellitus without complication, without long-term current use of insulin (HCC) E11.9   . Benign prostatic hyperplasia with weak urinary stream N40.1, R39.12   . Encounter for screening colonoscopy Z12.11       Past Medical History:        Diagnosis Date   . Anxiety    . Depression    . Erectile dysfunction    . Hypertension    . Neuropathy    . Obesity    . Restless legs syndrome    . Sleep apnea    . Type 2 diabetes mellitus without complication [facility])        Past Surgical History:        Procedure Laterality Date   . APPENDECTOMY  1974?   [name] EYE SURGERY  2000?    Right eye  cornea transplant   . TONSILLECTOMY  1960?       Social History:    Social History     Tobacco Use   . Smoking status: Passive Smoke Exposure - Never Smoker     Passive exposure: Never   . Smokeless tobacco: Never   . Tobacco comments:     Sporadic tobacco use (1-4x/yr) between [id number]?   Substance Use Topics   . Alcohol use: Yes     Comment: Only about 1-3 drinks (various types) per month on average                                Counseling given: Not Answered  Tobacco comments: Sporadic tobacco use (1-4x/yr) between [id number]?      Vital Signs (Current):   Vitals:    02/17/23 0931   Weight: 111.6 kg (246 lb)   Height: 1.778 m (5\' 10" )                                              BP Readings from Last 3 Encounters:   01/14/23 (!) 140/80       NPO Status: Time of last liquid consumption: 2230                        Time of last solid consumption: 2300                        Date of last liquid consumption: 02/23/23                        Date of last solid food consumption: 02/22/23    BMI:   Wt Readings from Last 3 Encounters:   02/17/23 111.6 kg (246 lb)   01/14/23 111.6 kg (246 lb)     Body mass index is 35.3 kg/m.    CBC:  No results found for: "WBC", "RBC", "HGB", "HCT", "MCV", "RDW", "PLT"    CMP: No results found for: "NA", "K", "CL", "CO2", "BUN", "CREATININE", "GFRAA", "AGRATIO", "LABGLOM", "GLUCOSE", "GLU", "PROT", "CALCIUM", "BILITOT", "ALKPHOS", "AST", "ALT"    POC Tests: No results for input(s): "POCGLU", "POCNA", "POCK", "POCCL", "POCBUN", "POCHEMO", "POCHCT" in the last 72 hours.    Coags: No results found for: "PROTIME", "INR", "APTT"    HCG (If Applicable): No results found for: "PREGTESTUR", "PREGSERUM", "HCG", "HCGQUANT"     ABGs: No results found for: "PHART", "PO2ART", "PCO2ART", "HCO3ART", "BEART", "O2SATART"     Type & Screen (If Applicable):  No results found for: "LABABO", "LABRH"    Drug/Infectious Status (If Applicable):  No results found for: "HIV", "HEPCAB"    COVID-19 Screening (If  Applicable): No results found for: "COVID19"        Anesthesia Evaluation  Patient summary reviewed   no history of anesthetic complications:   Airway: Mallampati: II  TM distance: >3 FB   Neck ROM: full  Mouth opening: > = 3 FB   Dental: normal exam         Pulmonary:normal exam    (+)     sleep apnea: on CPAP,                                  Cardiovascular:  Exercise tolerance: good (>4 METS)  (+) hypertension:                  Neuro/Psych:   (+) depression/anxiety             GI/Hepatic/Renal: Neg GI/Hepatic/Renal ROS            Endo/Other:    (+) DiabetesType II DM.                 Abdominal:             Vascular: negative vascular ROS.         Other Findings:         Anesthesia Plan      TIVA     ASA 2       Induction: intravenous.      Anesthetic plan and risks discussed with patient and spouse.                      Ann Maki, MD   02/24/2023

## 2023-02-25 ENCOUNTER — Encounter (INDEPENDENT_AMBULATORY_CARE_PROVIDER_SITE_OTHER): Payer: Medicare Other | Admitting: Ophthalmology

## 2023-03-10 ENCOUNTER — Encounter: Payer: Self-pay | Admitting: Gastroenterology

## 2023-03-15 ENCOUNTER — Encounter

## 2023-03-15 LAB — LIPID PANEL
Chol/HDL Ratio: 2.3
Cholesterol, Total: 119 MG/DL (ref <200)
HDL: 51 MG/DL (ref 40–60)
LDL Calculated: 46.8 MG/DL (ref <100)
Triglycerides: 106 MG/DL (ref 35–150)
VLDL Cholesterol Calculated: 21.2 MG/DL (ref 6.0–23.0)

## 2023-03-15 LAB — COMPREHENSIVE METABOLIC PANEL
ALT: 29 U/L (ref 12–65)
AST: 15 U/L (ref 15–37)
Albumin/Globulin Ratio: 1.1 (ref 0.4–1.6)
Albumin: 3.8 g/dL (ref 3.2–4.6)
Alk Phosphatase: 37 U/L — ABNORMAL LOW (ref 50–136)
Anion Gap: 5 mmol/L (ref 2–11)
BUN: 11 MG/DL (ref 8–23)
CO2: 27 mmol/L (ref 21–32)
Calcium: 9.3 MG/DL (ref 8.3–10.4)
Chloride: 106 mmol/L (ref 103–113)
Creatinine: 1 MG/DL (ref 0.8–1.5)
Est, Glom Filt Rate: 81 mL/min/{1.73_m2} (ref >60)
Globulin: 3.5 g/dL (ref 2.8–4.5)
Glucose: 98 mg/dL (ref 65–100)
Potassium: 3.7 mmol/L (ref 3.5–5.1)
Sodium: 138 mmol/L (ref 136–146)
Total Bilirubin: 0.5 MG/DL (ref 0.2–1.1)
Total Protein: 7.3 g/dL (ref 6.3–8.2)

## 2023-03-15 LAB — CBC WITH AUTO DIFFERENTIAL
Basophils %: 1 % (ref 0.0–2.0)
Basophils Absolute: 0 10*3/uL (ref 0.0–0.2)
Eosinophils %: 2 % (ref 0.5–7.8)
Eosinophils Absolute: 0.1 10*3/uL (ref 0.0–0.8)
Hematocrit: 40 % — ABNORMAL LOW (ref 41.1–50.3)
Hemoglobin: 13 g/dL — ABNORMAL LOW (ref 13.6–17.2)
Immature Granulocytes %: 1 % (ref 0.0–5.0)
Immature Granulocytes Absolute: 0 10*3/uL (ref 0.0–0.5)
Lymphocytes %: 29 % (ref 13–44)
Lymphocytes Absolute: 1.9 10*3/uL (ref 0.5–4.6)
MCH: 28.1 PG (ref 26.1–32.9)
MCHC: 32.5 g/dL (ref 31.4–35.0)
MCV: 86.4 FL (ref 82–102)
MPV: 9.2 FL — ABNORMAL LOW (ref 9.4–12.3)
Monocytes %: 8 % (ref 4.0–12.0)
Monocytes Absolute: 0.5 10*3/uL (ref 0.1–1.3)
Neutrophils %: 59 % (ref 43–78)
Neutrophils Absolute: 4 10*3/uL (ref 1.7–8.2)
Platelets: 277 10*3/uL (ref 150–450)
RBC: 4.63 M/uL (ref 4.23–5.6)
RDW: 13.7 % (ref 11.9–14.6)
WBC: 6.6 10*3/uL (ref 4.3–11.1)
nRBC: 0 10*3/uL (ref 0.0–0.2)

## 2023-03-15 LAB — MICROALBUMIN / CREATININE URINE RATIO
Creatinine, Ur: 74 mg/dL
Microalbumin, Random Urine: <0.5 MG/DL

## 2023-03-15 LAB — URINALYSIS, MICRO: BACTERIA, URINE: NEGATIVE /hpf

## 2023-03-15 LAB — PROTEIN / CREATININE RATIO, URINE
Creatinine, Ur: 74 mg/dL
PROTEIN/CREAT RATIO URINE RAN: 0.1
Protein, Urine, Random: 8 mg/dL (ref <11.9)

## 2023-03-15 LAB — TSH: TSH, 3RD GENERATION: 1.64 u[IU]/mL (ref 0.358–3.740)

## 2023-03-16 LAB — HEMOGLOBIN A1C
Estimated Avg Glucose: 143 mg/dL
Hemoglobin A1C: 6.6 % — ABNORMAL HIGH (ref 4.8–5.6)

## 2023-03-18 ENCOUNTER — Ambulatory Visit: Admit: 2023-03-18 | Discharge: 2023-03-18 | Payer: MEDICARE | Attending: Family | Primary: Family

## 2023-03-18 MED ORDER — INSULIN DETEMIR 100 UNIT/ML SC SOPN
100 | Freq: Every day | SUBCUTANEOUS | 0 refills | Status: DC
Start: 2023-03-18 — End: 2023-04-13

## 2023-03-18 MED ORDER — LOSARTAN POTASSIUM 25 MG PO TABS
25 MG | ORAL_TABLET | Freq: Every day | ORAL | 1 refills | Status: AC
Start: 2023-03-18 — End: 2023-06-07

## 2023-03-18 MED ORDER — DEXCOM G7 SENSOR MISC
11 refills | Status: DC
Start: 2023-03-18 — End: 2023-04-23

## 2023-03-18 MED ORDER — FINASTERIDE 5 MG PO TABS
5 MG | ORAL_TABLET | Freq: Every day | ORAL | 2 refills | Status: AC
Start: 2023-03-18 — End: 2023-10-07

## 2023-03-18 MED ORDER — METFORMIN HCL 1000 MG PO TABS
1000 MG | ORAL_TABLET | Freq: Two times a day (BID) | ORAL | 3 refills | Status: AC
Start: 2023-03-18 — End: 2023-09-06

## 2023-03-18 MED ORDER — GABAPENTIN 300 MG PO CAPS
300 MG | ORAL_CAPSULE | ORAL | 2 refills | Status: AC
Start: 2023-03-18 — End: 2024-03-17

## 2023-03-18 MED ORDER — ALFUZOSIN HCL ER 10 MG PO TB24
10 MG | ORAL_TABLET | Freq: Every day | ORAL | 3 refills | Status: AC
Start: 2023-03-18 — End: ?

## 2023-03-18 MED ORDER — BUPROPION HCL ER (SR) 150 MG PO TB12
150 MG | ORAL_TABLET | Freq: Two times a day (BID) | ORAL | 3 refills | Status: AC
Start: 2023-03-18 — End: 2024-01-13

## 2023-03-18 MED ORDER — DEXCOM G7 RECEIVER DEVI
0 refills | Status: AC
Start: 2023-03-18 — End: 2023-04-23

## 2023-03-18 NOTE — Addendum Note (Signed)
Addended by: Lenor Derrick on: 03/18/2023 06:23 PM     Modules accepted: Level of Service

## 2023-03-18 NOTE — Progress Notes (Signed)
PROGRESS NOTE      Chief Complaint   Patient presents with    Hypertension     2 mo f/u with labs prior       HPI    History of hypertension: Stopped antihypertensives with lifestyle change and weight loss.     Atypical chest pain: 2 weeks ago. In evening while reaching up with right hand experienced severe bilateral chest pain with dyspnea and dizziness. Symptoms lasted 5-10 minutes then resolved. No syncope. No nausea or diaphoresis. Did not seek emergency attention. No history of similar symptoms or problems since this event. Evaluated by cardiology in [place] but no records available. Does not recall date of most recent stress test.    Type 2 diabetes:  Metformin 1000 mg twice daily, Levemir 40unit. Previously on Ozempic but expensive.  Last A1C 6.9%  Fasting glucose 130s-150s    Obesity: BMI over 35.Lost a couple of pounds.     Hyperlipidemia: Lipitor 80 mg and fenofibrate 135 mg. LDL 40s     Anxiety and depression: Wellbutrin sr 150 mg twice daily, Remeron 15 mg at bedtime (insomnia). Previously followed by psychiatry and psychology.     BPH with decreased urine stream: Referred to urology-Dr. Prescilla Sours with decreased force of urine stream.  Discussed options and decided on continued medical management with Proscar, Cialis 5 mg, and Uroxatrol daily.  Follow-up scheduled.      History of kidney stones: S/p retrieval and stent placement     Chronic back pain: surgery x2. Artificial discs placed.  Referred for physical therapy - completed and working on home exercises     Restless leg syndrome: gabapentin  at bed time     History of colon polyps: Recent colonoscopy (5 year recall).  Benign pathology.  10-year recall recommended.                Past Medical History, Past Surgical History, Family history, Social History, and Medications were all reviewed and updated as necessary.     Current Outpatient Medications   Medication Sig Dispense Refill    metFORMIN (GLUCOPHAGE) 1000 MG tablet Take 1 tablet by mouth 2  times daily (with meals) 90 tablet 3    buPROPion (WELLBUTRIN SR) 150 MG extended release tablet Take 1 tablet by mouth 2 times daily 90 tablet 3    gabapentin (NEURONTIN) 300 MG capsule Take 1 capsule by mouth. 2 capsules at bedtime 180 capsule 2    alfuzosin (UROXATRAL) 10 MG extended release tablet Take 1 tablet by mouth daily 90 tablet 3    finasteride (PROSCAR) 5 MG tablet Take 1 tablet by mouth daily 90 tablet 2    insulin detemir (LEVEMIR FLEXPEN) 100 UNIT/ML injection pen Inject 40 Units into the skin Daily 36 mL 0    Continuous Blood Gluc Sensor (DEXCOM G7 SENSOR) MISC As directed 3 each 11    Continuous Blood Gluc Receiver (DEXCOM G7 RECEIVER) DEVI As directed 1 each 0    losartan (COZAAR) 25 MG tablet Take 1 tablet by mouth daily 90 tablet 1    tamsulosin (FLOMAX) 0.4 MG capsule Take 1 capsule by mouth daily 90 capsule 3    atorvastatin (LIPITOR) 80 MG tablet Take 1 tablet by mouth daily 90 tablet 0    tadalafil (CIALIS) 5 MG tablet Take 1 tablet by mouth every morning 90 tablet 3    fenofibric acid (TRILIPIX) 135 MG CPDR capsule Take 1 capsule by mouth daily      azelastine (ASTELIN) 0.1 %  nasal spray 2 sprays by Nasal route 2 times daily Use in each nostril as directed      blood glucose test strips (ASCENSIA AUTODISC VI;ONE TOUCH ULTRA TEST VI) strip 1 each daily As needed.      Lancets Ultra Fine MISC by Does not apply route      sodium chloride (OCEAN) 0.65 % nasal spray 1 spray by Nasal route as needed for Congestion OTC as directed      Cyanocobalamin (VITAMIN B 12 PO) Take 2,000 mcg by mouth OTC as directed      aspirin 81 MG EC tablet Take 1 tablet by mouth daily      Omega-3 Fatty Acids (OMEGA 3 PO) Take 500 mcg by mouth OTC as directed      NONFORMULARY OTC Primal labs sleep refine tablets  as directed      mirtazapine (REMERON) 15 MG tablet Take 1 tablet by mouth nightly at bedtime. (Patient not taking: Reported on 03/18/2023)       No current facility-administered medications for this visit.      Allergies   Allergen Reactions    Antihistamines, Diphenhydramine-Type Other (See Comments)     Causes depression    Penicillins Hives    Sulfa Antibiotics Hives    Codeine Nausea And Vomiting       ASSESSMENT and PLAN    Ivo was seen today for hypertension.    Diagnoses and all orders for this visit:    Type 2 diabetes mellitus without complication, without long-term current use of insulin (HCC)  -     metFORMIN (GLUCOPHAGE) 1000 MG tablet; Take 1 tablet by mouth 2 times daily (with meals)  -     insulin detemir (LEVEMIR FLEXPEN) 100 UNIT/ML injection pen; Inject 40 Units into the skin Daily  -     Continuous Blood Gluc Sensor (DEXCOM G7 SENSOR) MISC; As directed  -     Continuous Blood Gluc Receiver (DEXCOM G7 RECEIVER) DEVI; As directed    Pure hypercholesterolemia    Uncontrolled hypertension  -     losartan (COZAAR) 25 MG tablet; Take 1 tablet by mouth daily    Chronic bilateral low back pain without sciatica    Balance problems    Severe obesity (BMI 35.0-35.9 with comorbidity) (HCC)    Benign prostatic hyperplasia with weak urinary stream  -     alfuzosin (UROXATRAL) 10 MG extended release tablet; Take 1 tablet by mouth daily  -     finasteride (PROSCAR) 5 MG tablet; Take 1 tablet by mouth daily    History of kidney stones    Anxiety  -     buPROPion (WELLBUTRIN SR) 150 MG extended release tablet; Take 1 tablet by mouth 2 times daily    Atypical chest pain  -     EKG 12 Lead  -     [facility] [place]    Restless leg syndrome  -     gabapentin (NEURONTIN) 300 MG capsule; Take 1 capsule by mouth. 2 capsules at bedtime    Reviewed labs and discussed significance of findings.  A1c is good.  Will continue metformin and Levemir.  Hold on Ozempic since too expensive.  Rx for CGM.  Will follow.  BP goal under 130/80.  Add low-dose losartan 25 mg.  Will monitor at home 2 to 3 days/week.  Call if persistent elevations.  Reviewed note from urology.  Continue current therapy.    Reviewed also  note from  colonoscopy.  Colonoscopy recall 10 years.  ECG: Sinus rhythm.  74 bpm.  Right bundle branch block.  Rhythm strip from colonoscopy in March showed also right bundle branch block and this apparently prior to the chest pain symptoms he described.  Requesting records from cardiology in [place].  Will ask our cardiologist to evaluate.  Advised to seek emergency assistance if return of symptoms.    Medical problems and test results were reviewed with the patient today.     FOLLOW UP    Return for AWV and FU october.       REVIEW OF SYSTEMS    Review of Systems   Constitutional:  Negative for unexpected weight change.   Eyes:  Negative for visual disturbance.   Respiratory:  Positive for shortness of breath (resolved).    Cardiovascular:  Positive for chest pain. Negative for palpitations and leg swelling.   Neurological:  Positive for dizziness (resolve). Negative for headaches.   Psychiatric/Behavioral:  Negative for dysphoric mood. The patient is not nervous/anxious.        PHYSICAL EXAM    BP (!) 146/88   Ht 1.778 m (5\' 10" )   Wt 110.8 kg (244 lb 3.2 oz)   BMI 35.04 kg/m      Physical Exam  Vitals and nursing note reviewed.   Constitutional:       Appearance: Normal appearance. He is not ill-appearing.   Cardiovascular:      Rate and Rhythm: Normal rate and regular rhythm.      Heart sounds: Murmur heard.   Pulmonary:      Effort: Pulmonary effort is normal.      Breath sounds: Normal breath sounds. No wheezing or rhonchi.   Musculoskeletal:      Right lower leg: No edema.      Left lower leg: No edema.   Neurological:      Mental Status: He is alert.   Psychiatric:         Mood and Affect: Mood normal.         Behavior: Behavior normal.

## 2023-04-13 ENCOUNTER — Telehealth

## 2023-04-13 MED ORDER — TOUJEO MAX SOLOSTAR 300 UNIT/ML SC SOPN
300 | SUBCUTANEOUS | 1 refills | Status: DC
Start: 2023-04-13 — End: 2023-06-07

## 2023-04-13 NOTE — Telephone Encounter (Signed)
I am going to change his insulin from Levemir to [facility] but would like for him to decrease his daily dose to 30 units daily.  Continue to monitor home glucose.  Can titrate up 2 units every 5 days if fasting glucose consistently over 150.  Decrease by 5 units and reassess if hypoglycemia.

## 2023-04-13 NOTE — Telephone Encounter (Signed)
Please advise. Insurance denied Levemir Flexpen. Formulary alternatives are toujeo solostar, toujeo max solostar, and lantus.

## 2023-04-14 NOTE — Telephone Encounter (Signed)
Pt sent in MyChart in regards to insulin, let pt know what Dorene Grebe said.

## 2023-04-15 ENCOUNTER — Encounter

## 2023-04-23 ENCOUNTER — Encounter: Admit: 2023-04-23 | Discharge: 2023-04-23 | Payer: MEDICARE | Attending: Cardiovascular Disease | Primary: Family

## 2023-04-23 DIAGNOSIS — R0789 Other chest pain: Secondary | ICD-10-CM

## 2023-04-23 NOTE — Progress Notes (Signed)
[facility] History & Physical                 Reason for Visit: Chest pain    Subjective:     Patient is a 72 y.o. male with a PMH of ill-defined condition (aortic stenosis), hypertension, hyperlipidemia, and diabetes who presents as a referral for chest pain.  He reports having a pain in the chest when he was lifting his right arm, reaching behind his head, and attempting to grab his headboard.  He says that the chest pain lasted for 5 minutes.  The patient also reports that he was "gasping" for air at that time.  He currently denies shortness of breath and hemoptysis.  The patient had a TTE in July 2022 that was noted to demonstrate a normal EF and "mild aortic valve stenosis".  However, his mean gradient was noted to be 11 mmHg and AVA 3.1 cm.  His ascending aorta was noted to be 3.8 cm.    Past Medical History:   Diagnosis Date    Anxiety     Depression     Erectile dysfunction     Hypertension     Neuropathy     Obesity     Restless legs syndrome     Sleep apnea     Type 2 diabetes mellitus without complication [facility])       Past Surgical History:   Procedure Laterality Date    APPENDECTOMY  1974?    COLONOSCOPY N/A 02/24/2023    COLONOSCOPY POLYPECTOMY SNARE/COLD BIOPSY performed by Raquel Sarna, MD at [facility] ENDOSCOPY    EYE SURGERY  2000?    Right eye cornea transplant    TONSILLECTOMY  1960?      Family History   Problem Relation Age of Onset    Depression Mother     Diabetes Father         Type II    Heart Disease Father     High Blood Pressure Father     High Cholesterol Father     Obesity Father     Alcohol Abuse Sister     Depression Sister     Obesity Sister     Hearing Loss Sister       Social History     Tobacco Use    Smoking status: Passive Smoke Exposure - Never Smoker     Passive exposure: Never    Smokeless tobacco: Never    Tobacco comments:     Sporadic tobacco use (1-4x/yr) between 1610-9604?   Substance Use Topics    Alcohol use: Yes     Comment: Only about 1-3 drinks (various types)  per month on average      Allergies   Allergen Reactions    Antihistamines, Diphenhydramine-Type Other (See Comments)     Causes depression    Penicillins Hives    Sulfa Antibiotics Hives    Codeine Nausea And Vomiting         ROS:  No obvious pertinent positives on review of systems except for what was outlined above.       Objective:       BP 134/82   Pulse 100   Ht 1.778 m (5\' 10" )   Wt 110.9 kg (244 lb 9.6 oz)   BMI 35.10 kg/m     BP Readings from Last 3 Encounters:   04/23/23 134/82   03/18/23 (!) 146/88   02/24/23 127/73       Wt Readings from Last  3 Encounters:   04/23/23 110.9 kg (244 lb 9.6 oz)   03/18/23 110.8 kg (244 lb 3.2 oz)   02/24/23 111.6 kg (246 lb)       General/Constitutional:   Alert and oriented x 3, no acute distress  HEENT:   normocephalic, atraumatic, moist mucous membranes  Neck:   No JVD or carotid bruits bilaterally  Cardiovascular:   regular rate and rhythm, no rub/gallop appreciated; 3 out of 6 systolic murmur heard loudest over the right upper sternal border  Pulmonary:   clear to auscultation bilaterally, no respiratory distress  Abdomen:   soft, non-tender, non-distended  Ext:   No sig LE edema bilaterally  Skin:  warm and dry, no obvious rashes seen  Neuro:   no obvious sensory or motor deficits  Psychiatric:   normal mood and affect    ECG:   Sinus tachycardia  RBBB  RAD  Heart rate 101 bpm    Data Review:   Lab Results   Component Value Date    CHOL 119 03/15/2023     Lab Results   Component Value Date    TRIG 106 03/15/2023     Lab Results   Component Value Date    HDL 51 03/15/2023     No components found for: "LDLCHOLESTEROL", "LDLCALC"  Lab Results   Component Value Date    VLDL 21.2 03/15/2023     Lab Results   Component Value Date    CHOLHDLRATIO 2.3 03/15/2023        Lab Results   Component Value Date/Time    NA 138 03/15/2023 01:25 PM    K 3.7 03/15/2023 01:25 PM    CL 106 03/15/2023 01:25 PM    CO2 27 03/15/2023 01:25 PM    BUN 11 03/15/2023 01:25 PM    CREATININE  1.00 03/15/2023 01:25 PM    GLUCOSE 98 03/15/2023 01:25 PM    CALCIUM 9.3 03/15/2023 01:25 PM         Lab Results   Component Value Date    ALT 29 03/15/2023    AST 15 03/15/2023        Assessment/Plan:   1. Atypical chest pain  2. Dyspnea, unspecified type  - Obtain an MPI  - Obtain an echocardiogram     3. Hyperlipidemia, unspecified hyperlipidemia type  - Continue with Lipitor    4. Hypertension, unspecified type  - Well-controlled  - PCP note reviewed  - Currently on losartan    5. Abnormal echocardiogram  - TTE completed at OSH in July 2022 was noted to demonstrate a normal EF with "mild aortic valve stenosis"; however, the mean gradient was noted to be 11 mmHg and AVA 3.1 cm  - Ascending aorta noted to be 3.8 cm on TTE completed at OSH in July 2022  - Obtain an echocardiogram    F/U: After testing    Jillyn Hidden, MD

## 2023-05-24 ENCOUNTER — Other Ambulatory Visit: Payer: Self-pay | Admitting: Family Medicine

## 2023-06-05 ENCOUNTER — Encounter

## 2023-06-07 ENCOUNTER — Ambulatory Visit: Admit: 2023-06-07 | Payer: MEDICARE | Primary: Family

## 2023-06-07 DIAGNOSIS — R0789 Other chest pain: Secondary | ICD-10-CM

## 2023-06-07 LAB — NM STRESS TEST WITH MYOCARDIAL PERFUSION
Baseline Diastolic BP: 86 mmHg
Baseline HR: 78 {beats}/min
Baseline Systolic BP: 129 mmHg
Body Surface Area: 2.34 m2
Stress Diastolic BP: 80 mmHg
Stress Peak HR: 141 {beats}/min
Stress Percent HR Achieved: 94 %
Stress Rate Pressure Product: 29046 bpm*mmHg
Stress Systolic BP: 206 mmHg
Stress Target HR: 150 {beats}/min

## 2023-06-07 MED ORDER — TECHNETIUM TC 99M SESTAMIBI IV KIT
Freq: Once | INTRAVENOUS | Status: AC | PRN
Start: 2023-06-07 — End: 2023-06-07
  Administered 2023-06-07: 17:00:00 10.9 via INTRAVENOUS

## 2023-06-07 MED ORDER — LOSARTAN POTASSIUM 25 MG PO TABS
25 MG | ORAL_TABLET | Freq: Every day | ORAL | 1 refills | Status: DC
Start: 2023-06-07 — End: 2023-10-07

## 2023-06-07 MED ORDER — INSULIN PEN NEEDLE 32G X 4 MM MISC
Freq: Every day | 3 refills | 85.00 days | Status: DC
Start: 2023-06-07 — End: 2024-05-11

## 2023-06-07 MED ORDER — TOUJEO MAX SOLOSTAR 300 UNIT/ML SC SOPN
300 | SUBCUTANEOUS | 1 refills | Status: DC
Start: 2023-06-07 — End: 2023-07-26

## 2023-06-07 MED ORDER — TECHNETIUM TC 99M SESTAMIBI IV KIT
Freq: Once | INTRAVENOUS | Status: AC | PRN
Start: 2023-06-07 — End: 2023-06-07
  Administered 2023-06-07: 17:00:00 30.9 via INTRAVENOUS

## 2023-06-07 NOTE — Telephone Encounter (Signed)
From: Chuck Hint  To: Lenor Derrick  Sent: 06/05/2023 11:39 AM EDT  Subject: New refills for change of pharmacy    Hello Kamare Caspers.    I need 3 medication refills phoned into the [facility] pharmacy, which we have changed from our previous [employer]. One of these is a refill from my previous physician in [place].    1. Losartan (prescribed by Dorene Grebe), 25mg  tab  2. Toujeo Max Solo 300IU/ML Inj San  3. BD Pen Needle/Nano 32GX4MM MIS BEC    Thanks.  Iverson Alamin

## 2023-06-07 NOTE — Telephone Encounter (Signed)
Please advise. Patient last seen on 03/18/23, follow-up scheduled for 09/30/23. Patient is requesting rxs be sent to new pharmacy. Rxs pended.

## 2023-06-08 LAB — NM STRESS TEST WITH MYOCARDIAL PERFUSION: Nuc Stress EF: 65 %

## 2023-06-08 NOTE — Telephone Encounter (Signed)
-----   Message from Jillyn Hidden, MD sent at 06/08/2023  8:30 AM EDT -----  Please let the patient know the stress test was negative for myocardial ischemia.

## 2023-06-08 NOTE — Telephone Encounter (Signed)
Pt.notified of MD response and v/u.

## 2023-06-09 ENCOUNTER — Ambulatory Visit: Admit: 2023-06-09 | Payer: MEDICARE | Primary: Family

## 2023-06-09 DIAGNOSIS — R0600 Dyspnea, unspecified: Secondary | ICD-10-CM

## 2023-06-09 LAB — ECHO (TTE) COMPLETE (PRN CONTRAST/BUBBLE/STRAIN/3D)
AV Area by Peak Velocity: 2 cm2
AV Area by VTI: 2 cm2
AV Mean Gradient: 8 mmHg
AV Mean Velocity: 1.3 m/s
AV Peak Gradient: 15 mmHg
AV Peak Velocity: 2 m/s
AV VTI: 35 cm
AV Velocity Ratio: 0.55
AVA/BSA Peak Velocity: 0.9 cm2/m2
AVA/BSA VTI: 0.9 cm2/m2
Ascending Aorta Index: 1.54 cm/m2
Ascending Aorta: 3.5 cm
Body Surface Area: 2.34 m2
E/E' Lateral: 12
E/E' Ratio (Averaged): 16.5
E/E' Septal: 21
EF BP: 59 % (ref 55–100)
Fractional Shortening 2D: 28 % (ref 28–44)
IVSd: 1.1 cm — AB (ref 0.6–1.0)
LA Diameter: 3.9 cm
LA Size Index: 1.72 cm/m2
LA Volume A-L A4C: 44 mL (ref 18–58)
LA Volume A-L A4C: 72 mL — AB (ref 18–58)
LA Volume A/L: 59 mL
LA Volume BP: 54 mL (ref 18–58)
LA Volume Index A-L A2C: 19 mL/m2 (ref 16–34)
LA Volume Index A-L A4C: 32 mL/m2 (ref 16–34)
LA Volume Index A/L: 26 mL/m2 (ref 16–34)
LA Volume Index BP: 24 ml/m2 (ref 16–34)
LA Volume Index MOD A2C: 18 ml/m2 (ref 16–34)
LA Volume Index MOD A4C: 30 ml/m2 (ref 16–34)
LA Volume MOD A2C: 41 mL (ref 18–58)
LA Volume MOD A4C: 67 mL — AB (ref 18–58)
LV E' Lateral Velocity: 7 cm/s
LV E' Septal Velocity: 4 cm/s
LV EDV A2C: 81 mL
LV EDV A4C: 125 mL
LV EDV BP: 102 mL (ref 67–155)
LV EDV Index A2C: 36 mL/m2
LV EDV Index A4C: 55 mL/m2
LV EDV Index BP: 45 mL/m2
LV ESV A2C: 37 mL
LV ESV A4C: 47 mL
LV ESV BP: 42 mL (ref 22–58)
LV ESV Index A2C: 16 mL/m2
LV ESV Index A4C: 21 mL/m2
LV ESV Index BP: 19 mL/m2
LV Ejection Fraction A2C: 54 %
LV Ejection Fraction A4C: 63 %
LV Mass 2D Index: 81.4 g/m2 (ref 49–115)
LV Mass 2D: 184.7 g (ref 88–224)
LV RWT Ratio: 0.6
LVIDd Index: 1.89 cm/m2
LVIDd: 4.3 cm (ref 4.2–5.9)
LVIDs Index: 1.37 cm/m2
LVIDs: 3.1 cm
LVOT Area: 3.8 cm2
LVOT Diameter: 2.2 cm
LVOT Mean Gradient: 2 mmHg
LVOT Peak Gradient: 4 mmHg
LVOT Peak Velocity: 1.1 m/s
LVOT SV: 70.3 ml
LVOT Stroke Volume Index: 31 mL/m2
LVOT VTI: 18.5 cm
LVOT:AV VTI Index: 0.53
LVPWd: 1.3 cm — AB (ref 0.6–1.0)
MV A Velocity: 0.86 m/s
MV E Velocity: 0.84 m/s
MV E Wave Deceleration Time: 239.5 ms
MV E/A: 0.98
RV Basal Dimension: 3.4 cm
RV Mid Dimension: 3.1 cm
RVIDd: 3.6 cm
TAPSE: 2.3 cm (ref 1.7–?)

## 2023-06-10 NOTE — Telephone Encounter (Signed)
Pt was notified of Echo results. Pt states he would like to keep his appointment on 06/16/23 just to go over all of his results and make sure he doesn't have any questions.

## 2023-06-10 NOTE — Telephone Encounter (Signed)
-----   Message from Jillyn Hidden, MD sent at 06/09/2023  6:48 PM EDT -----  Please let the patient know that the heart function is normal on ECHO. The patient does have mild mitral regurgitation documented on the echocardiogram.  Therefore, this warrants a surveillance echocardiogram in 3 to 5 years.  Mild mitral regurgitation is a common finding on echocardiography and does not account for the patient's symptoms.

## 2023-06-16 ENCOUNTER — Ambulatory Visit: Admit: 2023-06-16 | Discharge: 2023-06-16 | Payer: MEDICARE | Attending: Cardiovascular Disease | Primary: Family

## 2023-06-16 DIAGNOSIS — I10 Essential (primary) hypertension: Secondary | ICD-10-CM

## 2023-06-16 NOTE — Progress Notes (Signed)
[facility] Follow Up                 Reason for Visit: Follow-up testing    Subjective:     Patient is a 72 y.o. male with a PMH of hypertension, hyperlipidemia, and diabetes who presents for follow-up.  The patient was last seen in May 2024.  An MPI and a TTE were ordered for atypical chest pain and dyspnea.  He had an MPI in July 2024 that noted a normal perfusion.  The patient had a TTE in July 2024 that was noted to demonstrate a normal EF, mild MR, and AV sclerosis.  The patient denies angina and dyspnea.    Past Medical History:   Diagnosis Date    Anxiety     Depression     Erectile dysfunction     Hypertension     Neuropathy     Obesity     Restless legs syndrome     Sleep apnea     Type 2 diabetes mellitus without complication [facility])       Past Surgical History:   Procedure Laterality Date    APPENDECTOMY  1974?    COLONOSCOPY N/A 02/24/2023    COLONOSCOPY POLYPECTOMY SNARE/COLD BIOPSY performed by Raquel Sarna, MD at [facility] ENDOSCOPY    EYE SURGERY  2000?    Right eye cornea transplant    TONSILLECTOMY  1960?      Family History   Problem Relation Age of Onset    Depression Mother     Diabetes Father         Type II    Heart Disease Father     High Blood Pressure Father     High Cholesterol Father     Obesity Father     Alcohol Abuse Sister     Depression Sister     Obesity Sister     Hearing Loss Sister       Social History     Tobacco Use    Smoking status: Never     Passive exposure: Yes    Smokeless tobacco: Never    Tobacco comments:     Sporadic tobacco use (1-4x/yr) between 1610-9604?   Substance Use Topics    Alcohol use: Yes     Comment: Only about 1-3 drinks (various types) per month on average      Allergies   Allergen Reactions    Antihistamines, Diphenhydramine-Type Other (See Comments)     Causes depression    Penicillins Hives    Sulfa Antibiotics Hives    Codeine Nausea And Vomiting         ROS:  No obvious pertinent positives on review of systems except for what was outlined above.        Objective:       BP 126/60   Pulse (!) 110   Ht 1.778 m (5\' 10" )   Wt 110.5 kg (243 lb 11.2 oz)   BMI 34.97 kg/m     BP Readings from Last 3 Encounters:   06/16/23 126/60   06/09/23 120/74   06/07/23 129/86       Wt Readings from Last 3 Encounters:   06/16/23 110.5 kg (243 lb 11.2 oz)   06/09/23 110.7 kg (244 lb)   06/07/23 110.7 kg (244 lb)       General/Constitutional:   Alert and oriented x 3, no acute distress  HEENT:   normocephalic, atraumatic, moist mucous membranes  Neck:   No  JVD or carotid bruits bilaterally  Cardiovascular:   Tachycardic, regular, no rub/gallop appreciated  Pulmonary:   clear to auscultation bilaterally, no respiratory distress  Abdomen:   soft, non-tender, non-distended  Ext:   1+ LE edema bilaterally; telangiectasia noted bilaterally in the LEs  Skin:  warm and dry, no obvious rashes seen  Neuro:   no obvious sensory or motor deficits  Psychiatric:   normal mood and affect    ECG:   Sinus tachycardia   RBBB  RAD  Heart rate 113 BPM    Data Review:   Lab Results   Component Value Date    CHOL 119 03/15/2023     Lab Results   Component Value Date    TRIG 106 03/15/2023     Lab Results   Component Value Date    HDL 51 03/15/2023     No components found for: "LDLCHOLESTEROL", "LDLCALC"  Lab Results   Component Value Date    VLDL 21.2 03/15/2023     Lab Results   Component Value Date    CHOLHDLRATIO 2.3 03/15/2023        Lab Results   Component Value Date/Time    NA 138 03/15/2023 01:25 PM    K 3.7 03/15/2023 01:25 PM    CL 106 03/15/2023 01:25 PM    CO2 27 03/15/2023 01:25 PM    BUN 11 03/15/2023 01:25 PM    CREATININE 1.00 03/15/2023 01:25 PM    GLUCOSE 98 03/15/2023 01:25 PM    CALCIUM 9.3 03/15/2023 01:25 PM         Lab Results   Component Value Date    ALT 29 03/15/2023    AST 15 03/15/2023        Assessment/Plan:   1. Hypertension, unspecified type  - Well-controlled  - PCP note reviewed  - Currently on losartan    2. Obesity (BMI 30-39.9)  - Educated on Mediterranean diet and  exercise  - Consider GLP-1 agonist: Defer to PCP    3. Hyperlipidemia, unspecified hyperlipidemia type  - Continue with Lipitor     4. Mild mitral regurgitation by prior echocardiogram  - Surveillance echocardiogram in 3 to 5 years  - Will defer to PCP to obtain the echocardiogram at the aforementioned recommended time window with follow-up with [name] as needed if there is progression (message sent to PCP)     5. Chronic venous insufficiency   - Educated on leg elevation and compression stockings    F/U: As needed    Jillyn Hidden, MD

## 2023-07-25 ENCOUNTER — Encounter

## 2023-07-26 ENCOUNTER — Encounter

## 2023-07-26 MED ORDER — TOUJEO MAX SOLOSTAR 300 UNIT/ML SC SOPN
300 UNIT/ML | SUBCUTANEOUS | 1 refills | Status: AC
Start: 2023-07-26 — End: 2023-10-07

## 2023-07-26 MED ORDER — ATORVASTATIN CALCIUM 80 MG PO TABS
80 MG | ORAL_TABLET | Freq: Every day | ORAL | 0 refills | Status: DC
Start: 2023-07-26 — End: 2023-10-07

## 2023-07-26 MED ORDER — TOUJEO MAX SOLOSTAR 300 UNIT/ML SC SOPN
300 | SUBCUTANEOUS | 1 refills | Status: DC
Start: 2023-07-26 — End: 2023-07-26

## 2023-07-26 NOTE — Telephone Encounter (Signed)
Please advise 

## 2023-07-26 NOTE — Telephone Encounter (Signed)
I pended a new rx that insurance should approve. Patient is notified to keep his same units, to ignore the sig.

## 2023-07-26 NOTE — Telephone Encounter (Signed)
Please advise. Patient last seen on 03/18/23, follow-up scheduled for 09/30/23. Rx last wrote on 02/06/23 #90 with 0 refills. Rx pended.

## 2023-07-26 NOTE — Telephone Encounter (Signed)
So I did write this for up to 50 units daily - 5 pens should give 90 days

## 2023-07-28 MED ORDER — FENOFIBRIC ACID 135 MG PO CPDR
135 | ORAL_CAPSULE | Freq: Every day | ORAL | 0 refills | Status: DC
Start: 2023-07-28 — End: 2023-10-07

## 2023-07-28 NOTE — Telephone Encounter (Signed)
 Please advise. Patient requesting refill on Fenofibric, previously wrote by [place] pcp.Rx pended.

## 2023-08-12 ENCOUNTER — Ambulatory Visit: Admit: 2023-08-12 | Discharge: 2023-08-12 | Payer: Medicare Other | Attending: Registered Nurse | Primary: Family

## 2023-08-12 DIAGNOSIS — N401 Enlarged prostate with lower urinary tract symptoms: Secondary | ICD-10-CM

## 2023-08-12 LAB — AMB POC URINALYSIS DIP STICK AUTO W/O MICRO
Bilirubin, Urine, POC: NEGATIVE
Blood (UA POC): NEGATIVE
Glucose, Urine, POC: NEGATIVE
KETONES, Urine, POC: NEGATIVE
Leukocyte Esterase, Urine, POC: NEGATIVE
Nitrite, Urine, POC: NEGATIVE
Protein, Urine, POC: NEGATIVE
Specific Gravity, Urine, POC: 1.015 (ref 1.001–1.035)
Urobilinogen, POC: 1
pH, Urine, POC: 6 (ref 4.6–8.0)

## 2023-08-12 MED ORDER — SILODOSIN 8 MG PO CAPS
8 | ORAL_CAPSULE | Freq: Every day | ORAL | 3 refills | Status: DC
Start: 2023-08-12 — End: 2023-11-22

## 2023-08-12 NOTE — Progress Notes (Signed)
 [facility]  [address]    [address]  [place] [address]  [phone]          Lee Page  DOB: 1951/09/11    Chief Complaint   Patient presents with    Follow-up          HPI     Lee Page is a 72 y.o. male who was referred by Dr. Lovell-Sherman for evaluation of BPH on 02/09/23.  Pt reports decreased stream and freq with nocturia times one on Proscar , Uroxatral  and Cialis  5mg  qd.  Denies UTI's or hematuria.  PVR 165cc by ultrasound.   Recently moved to area from [place].  Followed by urologist there (Dr. Sherrilee).  No records available at this time.  No recent PSA.  Previous procedure for the prostate which he describes as a Roto rooter.  Originally from [place] . PSA UND on 02/09/23.        Past Medical History:   Diagnosis Date    Anxiety     Depression     Erectile dysfunction     Hypertension     Neuropathy     Obesity     Restless legs syndrome     Sleep apnea     Type 2 diabetes mellitus without complication [facility])      Past Surgical History:   Procedure Laterality Date    APPENDECTOMY  1974?    COLONOSCOPY N/A 02/24/2023    COLONOSCOPY POLYPECTOMY SNARE/COLD BIOPSY performed by Verne Heidi PENNER, MD at [facility] ENDOSCOPY    EYE SURGERY  2000?    Right eye cornea transplant    TONSILLECTOMY  1960?     Current Outpatient Medications   Medication Sig Dispense Refill    silodosin  (RAPAFLO ) 8 MG CAPS Take 1 capsule by mouth daily 90 capsule 3    fenofibric acid  (TRILIPIX ) 135 MG CPDR capsule Take 1 capsule by mouth daily 90 capsule 0    atorvastatin  (LIPITOR) 80 MG tablet Take 1 tablet by mouth once daily 90 tablet 0    Insulin  Glargine, 2 Unit Dial, (TOUJEO  MAX SOLOSTAR) 300 UNIT/ML SOPN 50 units daily. 15 mL 1    losartan  (COZAAR ) 25 MG tablet Take 1 tablet by mouth daily 90 tablet 1    Insulin  Pen Needle 32G X 4 MM MISC 1 each by Does not apply route daily 100 each 3    metFORMIN  (GLUCOPHAGE ) 1000 MG tablet Take 1 tablet by mouth 2 times daily (with meals)  90 tablet 3    buPROPion  (WELLBUTRIN  SR) 150 MG extended release tablet Take 1 tablet by mouth 2 times daily 90 tablet 3    gabapentin  (NEURONTIN ) 300 MG capsule Take 1 capsule by mouth. 2 capsules at bedtime 180 capsule 2    finasteride  (PROSCAR ) 5 MG tablet Take 1 tablet by mouth daily 90 tablet 2    tamsulosin  (FLOMAX ) 0.4 MG capsule Take 1 capsule by mouth daily 90 capsule 3    tadalafil  (CIALIS ) 5 MG tablet Take 1 tablet by mouth every morning 90 tablet 3    mirtazapine (REMERON) 15 MG tablet Take 1 tablet by mouth nightly at bedtime.      azelastine  (ASTELIN ) 0.1 % nasal spray 2 sprays by Nasal route 2 times daily Use in each nostril as directed      blood glucose test strips (ASCENSIA AUTODISC VI;ONE TOUCH ULTRA TEST VI) strip 1 each daily As needed.      Lancets Ultra Fine MISC by  Does not apply route      sodium chloride  (OCEAN) 0.65 % nasal spray 1 spray by Nasal route as needed for Congestion OTC as directed      Cyanocobalamin (VITAMIN B 12 PO) Take 2,000 mcg by mouth OTC as directed      aspirin 81 MG EC tablet Take 1 tablet by mouth daily      Omega-3 Fatty Acids (OMEGA 3 PO) Take 500 mcg by mouth OTC as directed      NONFORMULARY OTC Primal labs sleep refine tablets  as directed (Patient not taking: Reported on 06/16/2023)       No current facility-administered medications for this visit.     Allergies   Allergen Reactions    Antihistamines, Diphenhydramine-Type Other (See Comments)     Causes depression    Penicillins Hives    Statins Other (See Comments)    Sulfa Antibiotics Hives    Codeine Nausea And Vomiting     Social History     Socioeconomic History    Marital status: Married     Spouse name: Not on file    Number of children: Not on file    Years of education: Not on file    Highest education level: Not on file   Occupational History    Not on file   Tobacco Use    Smoking status: Never     Passive exposure: Yes    Smokeless tobacco: Never    Tobacco comments:     Sporadic tobacco use  (1-4x/yr) between 8020-8001?   Vaping Use    Vaping status: Never Used   Substance and Sexual Activity    Alcohol use: Yes     Comment: Only about 1-3 drinks (various types) per month on average    Drug use: Never    Sexual activity: Not Currently     Partners: Female   Other Topics Concern    Not on file   Social History Narrative    Not on file     Social Determinants of Health     Financial Resource Strain: Low Risk  (01/14/2023)    Overall Financial Resource Strain (CARDIA)     Difficulty of Paying Living Expenses: Not hard at all   Food Insecurity: No Food Insecurity (01/14/2023)    Hunger Vital Sign     Worried About Running Out of Food in the Last Year: Never true     Ran Out of Food in the Last Year: Never true   Transportation Needs: Unknown (01/14/2023)    PRAPARE - Therapist, Art (Medical): Not on file     Lack of Transportation (Non-Medical): No   Physical Activity: Unknown (01/12/2023)    Exercise Vital Sign     Days of Exercise per Week: 0 days     Minutes of Exercise per Session: Not on file   Stress: Not on file   Social Connections: Not on file   Intimate Partner Violence: Not on file   Housing Stability: Unknown (01/14/2023)    Housing Stability Vital Sign     Unable to Pay for Housing in the Last Year: Not on file     Number of Places Lived in the Last Year: Not on file     Unstable Housing in the Last Year: No     Family History   Problem Relation Age of Onset    Depression Mother     Diabetes Father  Type II    Heart Disease Father     High Blood Pressure Father     High Cholesterol Father     Obesity Father     Alcohol Abuse Sister     Depression Sister     Obesity Sister     Hearing Loss Sister        Review of Systems  Constitutional:   Negative for fever.  GI:  Negative for nausea.  Genitourinary: Positive for slower stream, leakage w/ urge and frequent urination.  Musculoskeletal:  Negative for back pain.      Urinalysis  UA - Dipstick  Results for orders placed  or performed in visit on 08/12/23   AMB POC URINALYSIS DIP STICK AUTO W/O MICRO   Result Value Ref Range    Color (UA POC)      Clarity (UA POC)      Glucose, Urine, POC Negative     Bilirubin, Urine, POC Negative     KETONES, Urine, POC Negative     Specific Gravity, Urine, POC 1.015 1.001 - 1.035    Blood (UA POC) Negative     pH, Urine, POC 6.0 4.6 - 8.0    Protein, Urine, POC Negative     Urobilinogen, POC 1 mg/dL     Nitrite, Urine, POC Negative     Leukocyte Esterase, Urine, POC Negative        UA - Micro  WBC - 0  RBC - 0  Bacteria - 0  Epith - 0    PHYSICAL EXAM    General appearance - well appearing and in no distress  Mental status - alert, oriented to person, place, and time  Neck - supple, no significant adenopathy  Chest/Lung-  Quiet, even and easy respiratory effort without use of accessory muscles  Skin - normal coloration and turgor, no rashes      Assessment and Plan    ICD-10-CM    1. BPH with obstruction/lower urinary tract symptoms  N40.1 AMB POC URINALYSIS DIP STICK AUTO W/O MICRO    N13.8       2. Nocturia  R35.1           BPH/LUTS/Nocturia- urine normal. Prev PVR normal. Discussed medication management vs cysto to determine if repeat TURP needed. As for medication, he would cont proscar  5 mg and cialis  5 mg, but we could change the alfuzosin  to rapaflo  8 mg PO daily to see if this helps. He opts to he opts to STOP alfusozin and start silodosin  (rapaflo ) 8 mg PO daily. Risks, benefits, and alternatives reviewed.    RTO in 3 months for follow up w PVR. Advised to call sooner if sx worsen.      Rosina KATHEE Mole, APRN - CNP  Dr. Debbora is supervising physician today and he approves plan of care.

## 2023-09-04 ENCOUNTER — Encounter

## 2023-09-06 ENCOUNTER — Encounter

## 2023-09-06 MED ORDER — METFORMIN HCL 1000 MG PO TABS
1000 MG | ORAL_TABLET | Freq: Two times a day (BID) | ORAL | 0 refills | Status: DC
Start: 2023-09-06 — End: 2023-10-07

## 2023-09-06 MED ORDER — METFORMIN HCL 1000 MG PO TABS
1000 | ORAL_TABLET | Freq: Two times a day (BID) | ORAL | 0 refills | Status: DC
Start: 2023-09-06 — End: 2023-09-06

## 2023-09-06 NOTE — Addendum Note (Signed)
Addended by: Lenor Derrick on: 09/06/2023 12:58 PM     Modules accepted: Orders

## 2023-09-06 NOTE — Telephone Encounter (Signed)
Please advise. Patient last seen on 03/18/23, follow-up scheduled 09/30/23. Rx last wrote on 03/18/23 #90 with 3 refills. Rx pended.

## 2023-09-12 ENCOUNTER — Encounter

## 2023-09-13 ENCOUNTER — Encounter

## 2023-09-20 ENCOUNTER — Encounter

## 2023-09-30 ENCOUNTER — Encounter: Admit: 2023-09-30 | Discharge: 2023-09-30 | Payer: MEDICARE | Attending: Family | Primary: Family

## 2023-09-30 DIAGNOSIS — Z0000 Encounter for general adult medical examination without abnormal findings: Secondary | ICD-10-CM

## 2023-09-30 NOTE — Progress Notes (Signed)
Medicare Annual Wellness Visit    Lee Page is here for Medicare AWV    Assessment & Plan   Medicare annual wellness visit, subsequent  Primary hypertension  Type 2 diabetes mellitus without complication, without long-term current use of insulin (HCC)  -     Hemoglobin A1C; Future  -     Microalbumin / Creatinine Urine Ratio; Future  Pure hypercholesterolemia  -     CBC with Auto Differential; Future  -     Comprehensive Metabolic Panel; Future  -     Lipid Panel; Future  -     TSH; Future  -     Urinalysis, Micro; Future  Discussed BMI and healthy weight and diet, weight bearing exercise, smoking avoidance, sun protection and medication compliance. Reviewed appropriate health maintenance screening. The patient was counseled regarding screening procedures and recommended schedules for regular prostate exam, PSA, self testicular exam, GI hemoccult testing, colonoscopy and recommended vaccinations.   PSA per urology    Recommendations for Preventive Services Due: see orders and patient instructions/AVS.  Recommended screening schedule for the next 5-10 years is provided to the patient in written form: see Patient Instructions/AVS.     Return for Will have fasting labs before scheduled follow-up.     Subjective       Patient's complete Health Risk Assessment and screening values have been reviewed and are found in Flowsheets. The following problems were reviewed today and where indicated follow up appointments were made and/or referrals ordered.    Positive Risk Factor Screenings with Interventions:    Fall Risk:  Do you feel unsteady or are you worried about falling? : (!) yes  2 or more falls in past year?: no  Fall with injury in past year?: no     Interventions:    Reviewed medications, home hazards, visual acuity, and co-morbidities that can increase risk for falls  Patient advised to follow-up in this office for further evaluation and treatment            General HRA Questions:  Select all that  apply: (!) Stress  Interventions - Stress:  Patient advised to follow up in the office for further evaluation and treatment      Inactivity:  On average, how many days per week do you engage in moderate to strenuous exercise (like a brisk walk)?: 0 days (!) Abnormal  On average, how many minutes do you engage in exercise at this level?: 0 min  Interventions:  See AVS for additional education material     Abnormal BMI (obese):  There is no height or weight on file to calculate BMI. (!) Abnormal  Interventions:  Patient advised to follow up in the office for further evaluation and treatment.          Hearing Screen:  Do you or your family notice any trouble with your hearing that hasn't been managed with hearing aids?: (!) Yes    Interventions:  Patient advised to follow-up in office.      ADL's:   Patient reports needing help with:  Select all that apply: (!) Walking/Balance  Interventions:  Patient advised to follow up in the office for further evaluation and treatment    Advanced Directives:  Do you have a Living Will?: (!) No    Intervention:  has NO advanced directive - information provided                     Objective  Patient-Reported Vitals  No data recorded               Allergies   Allergen Reactions    Antihistamines, Diphenhydramine-Type Other (See Comments)     Causes depression    Penicillins Hives    Statins Other (See Comments)    Sulfa Antibiotics Hives    Codeine Nausea And Vomiting     Prior to Visit Medications    Medication Sig Taking? Authorizing Provider   NONFORMULARY Prednisone Eye Drop after cataract surgery Yes [provider]   metFORMIN (GLUCOPHAGE) 1000 MG tablet Take 1 tablet by mouth 2 times daily (with meals) Yes Lovell-Sherman, Freddrick March, APRN - CNP   silodosin (RAPAFLO) 8 MG CAPS Take 1 capsule by mouth daily Yes Beverely Risen, APRN - CNP   fenofibric acid (TRILIPIX) 135 MG CPDR capsule Take 1 capsule by mouth daily Yes Lovell-Sherman, Pebbles Zeiders H, APRN - CNP    atorvastatin (LIPITOR) 80 MG tablet Take 1 tablet by mouth once daily Yes Lovell-Sherman, Kelsee Preslar H, APRN - CNP   Insulin Glargine, 2 Unit Dial, (TOUJEO MAX SOLOSTAR) 300 UNIT/ML SOPN 50 units daily. Yes Lovell-Sherman, Freddrick March, APRN - CNP   losartan (COZAAR) 25 MG tablet Take 1 tablet by mouth daily Yes Lovell-Sherman, Lenus Trauger H, APRN - CNP   Insulin Pen Needle 32G X 4 MM MISC 1 each by Does not apply route daily Yes Lovell-Sherman, Katrina Brosh H, APRN - CNP   buPROPion (WELLBUTRIN SR) 150 MG extended release tablet Take 1 tablet by mouth 2 times daily Yes Lovell-Sherman, Chrissie Dacquisto H, APRN - CNP   gabapentin (NEURONTIN) 300 MG capsule Take 1 capsule by mouth. 2 capsules at bedtime Yes Lovell-Sherman, Maylie Ashton H, APRN - CNP   finasteride (PROSCAR) 5 MG tablet Take 1 tablet by mouth daily Yes Lovell-Sherman, Freddrick March, APRN - CNP   tamsulosin (FLOMAX) 0.4 MG capsule Take 1 capsule by mouth daily Yes Lovell-Sherman, Toshua Honsinger H, APRN - CNP   tadalafil (CIALIS) 5 MG tablet Take 1 tablet by mouth every morning Yes Lovell-Sherman, Easton Fetty H, APRN - CNP   mirtazapine (REMERON) 15 MG tablet Take 1 tablet by mouth nightly as needed at bedtime. Yes [provider]   azelastine (ASTELIN) 0.1 % nasal spray 2 sprays by Nasal route 2 times daily Use in each nostril as directed Yes [provider]   blood glucose test strips (ASCENSIA AUTODISC VI;ONE TOUCH ULTRA TEST VI) strip 1 each daily As needed. Yes [provider]   Lancets Ultra Fine MISC by Does not apply route Yes [provider]   sodium chloride (OCEAN) 0.65 % nasal spray 1 spray by Nasal route as needed for Congestion OTC as directed Yes [provider]   Cyanocobalamin (VITAMIN B 12 PO) Take 2,000 mcg by mouth OTC as directed Yes [provider]   aspirin 81 MG EC tablet Take 1 tablet by mouth daily Yes [provider]   Omega-3 Fatty Acids (OMEGA 3 PO) Take 500 mcg by mouth OTC as directed Yes [provider]   NONFORMULARY OTC Primal labs sleep refine tablets  as directed  [provider]       CareTeam (Including outside providers/suppliers regularly involved in providing care):   Patient Care Team:  Lenor Derrick, APRN - CNP as PCP - General (Nurse Practitioner Family)  Lovell-Sherman, Freddrick March, APRN - CNP as PCP - Empaneled Provider      Reviewed and updated this visit:  Tobacco  Allergies  Meds  Problems  Med Hx  Surg Hx  Soc Hx  Fam Hx           Lee Page, was evaluated through a synchronous (real-time) audio-video encounter. The patient (or guardian if applicable) is aware that this is a billable service, which includes applicable co-pays. This Virtual Visit was conducted with patient's (and/or legal guardian's) consent. Patient identification was verified, and a caregiver was present when appropriate.   The patient was located at Home: [address]  [address]  [place] [address]  Provider was located at [employer] (Appt Dept): [address]  [place],  [place] [address]  Confirm you are appropriately licensed, registered, or certified to deliver care in the state where the patient is located as indicated above. If you are not or unsure, please re-schedule the visit: Yes, I confirm.

## 2023-09-30 NOTE — Patient Instructions (Signed)
Preventing Falls: Care Instructions  Injuries and health problems such as trouble walking or poor eyesight can increase your risk of falling. So can some medicines. But there are things you can do to help prevent falls. You can exercise to get stronger. You can also arrange your home to make it safer.    Talk to your doctor about the medicines you take. Ask if any of them increase the risk of falls and whether they can be changed or stopped.   Try to exercise regularly. It can help improve your strength and balance. This can help lower your risk of falling.         Practice fall safety and prevention.   Wear low-heeled shoes that fit well and give your feet good support. Talk to your doctor if you have foot problems that make this hard.  Carry a cellphone or wear a medical alert device that you can use to call for help.  Use stepladders instead of chairs to reach high objects. Don't climb if you're at risk for falls. Ask for help, if needed.  Wear the correct eyeglasses, if you need them.        Make your home safer.   Remove rugs, cords, clutter, and furniture from walkways.  Keep your house well lit. Use night-lights in hallways and bathrooms.  Install and use sturdy handrails on stairways.  Wear nonskid footwear, even inside. Don't walk barefoot or in socks without shoes.        Be safe outside.   Use handrails, curb cuts, and ramps whenever possible.  Keep your hands free by using a shoulder bag or backpack.  Try to walk in well-lit areas. Watch out for uneven ground, changes in pavement, and debris.  Be careful in the winter. Walk on the grass or gravel when sidewalks are slippery. Use de-icer on steps and walkways. Add non-slip devices to shoes.    Put grab bars and nonskid mats in your shower or tub and near the toilet. Try to use a shower chair or bath bench when bathing.   Get into a tub or shower by putting in your weaker leg first. Get out with your strong side first. Have a phone or medical alert  device in the bathroom with you.   Where can you learn more?  Go to [employer] and enter G117 to learn more about "Preventing Falls: Care Instructions."  Current as of: June 15, 2022  Content Version: 14.2   [address] [place].   Care instructions adapted under license by [facility]. If you have questions about a medical condition or this instruction, always ask your healthcare professional. [employer] disclaims any warranty or liability for your use of this information.           Learning About Stress  What is stress?     Stress is your body's response to a hard situation. Your body can have a physical, emotional, or mental response. Stress is a fact of life for most people, and it affects everyone differently. What causes stress for you may not be stressful for someone else.  A lot of things can cause stress. You may feel stress when you go on a job interview, take a test, or run a race. This kind of short-term stress is normal and even useful. It can help you if you need to work hard or react quickly. For example, stress can help you finish an important job on time.  Long-term stress is caused  by ongoing stressful situations or events. Examples of long-term stress include long-term health problems, ongoing problems at work, or conflicts in your family. Long-term stress can harm your health.  How does stress affect your health?  When you are stressed, your body responds as though you are in danger. It makes hormones that speed up your heart, make you breathe faster, and give you a burst of energy. This is called the fight-or-flight stress response. If the stress is over quickly, your body goes back to normal and no harm is done.  But if stress happens too often or lasts too long, it can have bad effects. Long-term stress can make you more likely to get sick, and it can make symptoms of some diseases worse. If you tense up when you are stressed, you may develop  neck, shoulder, or low back pain. Stress is linked to high blood pressure and heart disease.  Stress also harms your emotional health. It can make you moody, tense, or depressed. Your relationships may suffer, and you may not do well at work or school.  What can you do to manage stress?  You can try these things to help manage stress:   Do something active. Exercise or activity can help reduce stress. Walking is a great way to get started. Even everyday activities such as housecleaning or yard work can help.  Try yoga or tai chi. These techniques combine exercise and meditation. You may need some training at first to learn them.  Do something you enjoy. For example, listen to music or go to a movie. Practice your hobby or do volunteer work.  Meditate. This can help you relax, because you are not worrying about what happened before or what may happen in the future.  Do guided imagery. Imagine yourself in any setting that helps you feel calm. You can use online videos, books, or a teacher to guide you.  Do breathing exercises. For example:  From a standing position, bend forward from the waist with your knees slightly bent. Let your arms dangle close to the floor.  Breathe in slowly and deeply as you return to a standing position. Roll up slowly and lift your head last.  Hold your breath for just a few seconds in the standing position.  Breathe out slowly and bend forward from the waist.  Let your feelings out. Talk, laugh, cry, and express anger when you need to. Talking with supportive friends or family, a Veterinary surgeon, or a faith leader about your feelings is a healthy way to relieve stress. Avoid discussing your feelings with people who make you feel worse.  Write. It may help to write about things that are bothering you. This helps you find out how much stress you feel and what is causing it. When you know this, you can find better ways to cope.  What can you do to prevent stress?  You might try some of these things  to help prevent stress:  Manage your time. This helps you find time to do the things you want and need to do.  Get enough sleep. Your body recovers from the stresses of the day while you are sleeping.  Get support. Your family, friends, and community can make a difference in how you experience stress.  Limit your news feed. Avoid or limit time on social media or news that may make you feel stressed.  Do something active. Exercise or activity can help reduce stress. Walking is a great way to get started.  Where can you learn more?  Go to [employer] and enter N032 to learn more about "Learning About Stress."  Current as of: September 22, 2022  Content Version: 14.2   [address] [place].   Care instructions adapted under license by [facility]. If you have questions about a medical condition or this instruction, always ask your healthcare professional. [employer] disclaims any warranty or liability for your use of this information.           Learning About Being Active as an Older Adult  Why is being active important as you get older?     Being active is one of the best things you can do for your health. And it's never too late to start. Being active--or getting active, if you aren't already--has definite benefits. It can:  Give you more energy,  Keep your mind sharp.  Improve balance to reduce your risk of falls.  Help you manage chronic illness with fewer medicines.  No matter how old you are, how fit you are, or what health problems you have, there is a form of activity that will work for you. And the more physical activity you can do, the better your overall health will be.  What kinds of activity can help you stay healthy?  Being more active will make your daily activities easier. Physical activity includes planned exercise and things you do in daily life. There are four types of activity:  Aerobic.  Doing aerobic activity makes your heart and lungs  strong.  Includes walking, dancing, and gardening.  Aim for at least 2 hours spread throughout the week.  It improves your energy and can help you sleep better.  Muscle-strengthening.  This type of activity can help maintain muscle and strengthen bones.  Includes climbing stairs, using resistance bands, and lifting or carrying heavy loads.  Aim for at least twice a week.  It can help protect the knees and other joints.  Stretching.  Stretching gives you better range of motion in joints and muscles.  Includes upper arm stretches, calf stretches, and gentle yoga.  Aim for at least twice a week, preferably after your muscles are warmed up from other activities.  It can help you function better in daily life.  Balancing.  This helps you stay coordinated and have good posture.  Includes heel-to-toe walking, tai chi, and certain types of yoga.  Aim for at least 3 days a week.  It can reduce your risk of falling.  Even if you have a hard time meeting the recommendations, it's better to be more active than less active. All activity done in each category counts toward your weekly total. You'd be surprised how daily things like carrying groceries, keeping up with grandchildren, and taking the stairs can add up.  What keeps you from being active?  If you've had a hard time being more active, you're not alone. Maybe you remember being able to do more. Or maybe you've never thought of yourself as being active. It's frustrating when you can't do the things you want. Being more active can help. What's holding you back?  Getting started.  Have a goal, but break it into easy tasks. Small steps build into big accomplishments.  Staying motivated.  If you feel like skipping your activity, remember your goal. Maybe you want to move better and stay independent. Every activity gets you one step closer.  Not feeling your best.  Start with 5 minutes of an activity you enjoy. Prove  to yourself you can do it. As you get comfortable, increase  your time.  You may not be where you want to be. But you're in the process of getting there. Everyone starts somewhere.  How can you find safe ways to stay active?  Talk with your doctor about any physical challenges you're facing. Make a plan with your doctor if you have a health problem or aren't sure how to get started with activity.  If you're already active, ask your doctor if there is anything you should change to stay safe as your body and health change.  If you tend to feel dizzy after you take medicine, avoid activity at that time. Try being active before you take your medicine. This will reduce your risk of falls.  If you plan to be active at home, make sure to clear your space before you get started. Remove things like TV cords, coffee tables, and throw rugs. It's safest to have plenty of space to move freely.  The key to getting more active is to take it slow and steady. Try to improve only a little bit at a time. Pick just one area to improve on at first. And if an activity hurts, stop and talk to your doctor.  Where can you learn more?  Go to [employer] and enter P600 to learn more about "Learning About Being Active as an Older Adult."  Current as of: May 04, 2022  Content Version: 14.2   [address], [place].   Care instructions adapted under license by [facility]. If you have questions about a medical condition or this instruction, always ask your healthcare professional. [employer] disclaims any warranty or liability for your use of this information.           Hearing Loss: Care Instructions  Overview     Hearing loss is a sudden or slow decrease in how well you hear. It can range from slight to profound. Permanent hearing loss can occur with aging. It also can happen when you are exposed long-term to loud noise. Examples include listening to loud music, riding motorcycles, or being around other loud machines.  Hearing loss can affect your work and  home life. It can make you feel lonely or depressed. You may feel that you have lost your independence. But hearing aids and other devices can help you hear better and feel connected to others.  Follow-up care is a key part of your treatment and safety. Be sure to make and go to all appointments, and call your doctor if you are having problems. It's also a good idea to know your test results and keep a list of the medicines you take.  How can you care for yourself at home?  Avoid loud noises whenever possible. This helps keep your hearing from getting worse.  Always wear hearing protection around loud noises.  Wear a hearing aid as directed.  A professional can help you pick a hearing aid that will work best for you.  You can also get hearing aids over the counter for mild to moderate hearing loss.  Have hearing tests as your doctor suggests. They can show whether your hearing has changed. Your hearing aid may need to be adjusted.  Use other devices as needed. These may include:  Telephone amplifiers and hearing aids that can connect to a television, stereo, radio, or microphone.  Devices that use lights or vibrations. These alert you to the doorbell, a ringing telephone, or a baby  monitor.  Television closed-captioning. This shows the words at the bottom of the screen. Most new TVs can do this.  TTY (text telephone). This lets you type messages back and forth on the telephone instead of talking or listening. These devices are also called TDD. When messages are typed on the keyboard, they are sent over the phone line to a receiving TTY. The message is shown on a monitor.  Use text messaging, social media, and email if it is hard for you to communicate by telephone.  Try to learn a listening technique called speechreading. It is not lipreading. You pay attention to people's gestures, expressions, posture, and tone of voice. These clues can help you understand what a person is saying. Face the person you are talking  to, and have them face you. Make sure the lighting is good. You need to see the other person's face clearly.  Think about counseling if you need help to adjust to your hearing loss.  When should you call for help?  Watch closely for changes in your health, and be sure to contact your doctor if:   You think your hearing is getting worse.    You have new symptoms, such as dizziness or nausea.   Where can you learn more?  Go to [employer] and enter R798 to learn more about "Hearing Loss: Care Instructions."  Current as of: August 26, 2022  Content Version: 14.2   [address] [place].   Care instructions adapted under license by [facility]. If you have questions about a medical condition or this instruction, always ask your healthcare professional. [employer] disclaims any warranty or liability for your use of this information.           Learning About Activities of Daily Living  What are activities of daily living?     Activities of daily living (ADLs) are the basic self-care tasks you do every day. These include eating, bathing, dressing, and moving around.  As you age, and if you have health problems, you may find that it's harder to do some of these tasks. If so, your doctor can suggest ideas that may help.  To measure what kind of help you may need, your doctor will ask how well you are able to do ADLs. Let your doctor know if there are any tasks that you are having trouble doing. This is an important first step to getting help. And when you have the help you need, you can stay as independent as possible.  How will a doctor assess your ADLs?  Asking about ADLs is part of a routine health checkup your doctor will likely do as you age. Your health check might be done in a doctor's office, in your home, or at a hospital. The goal is to find out if you are having any problems that could make it hard to care for yourself or that make it unsafe for you to be on your  own.  To measure your ADLs, your doctor will ask how hard it is for you to do routine tasks. Your doctor may also want to know if you have changed the way you do a task because of a health problem. Your doctor may watch how you:  Walk back and forth.  Keep your balance while you stand or walk.  Move from sitting to standing or from a bed to a chair.  Button or unbutton a [employer] fast streamer.  Remove and put on your shoes.  It's  common to feel a little worried or anxious if you find you can't do all the things you used to be able to do. Talking with your doctor about ADLs is a way to make sure you're as safe as possible and able to care for yourself as well as you can. You may want to bring a caregiver, friend, or family member to your checkup. They can help you talk to your doctor.  Follow-up care is a key part of your treatment and safety. Be sure to make and go to all appointments, and call your doctor if you are having problems. It's also a good idea to know your test results and keep a list of the medicines you take.  Current as of: September 22, 2022  Content Version: 14.2   [address] [place].   Care instructions adapted under license by [facility]. If you have questions about a medical condition or this instruction, always ask your healthcare professional. [employer] disclaims any warranty or liability for your use of this information.           Starting a Weight Loss Plan: Care Instructions  Overview     It can be a challenge to lose weight. But your doctor can help you make a weight-loss plan that meets your needs.  You don't have to make a lot of big changes at once. A better idea might be to focus on small changes and stick with them. When those changes become habit, you can add a few more changes.  Some people find it helpful to take an exercise or nutrition class. If you have questions, ask your doctor about seeing a registered dietitian or an exercise specialist. You might also  think about joining a weight-loss support group.  If you're not ready to make changes right now, try to pick a date in the future. Then make an appointment with your doctor to talk about when and how you'll get started with a plan.  Follow-up care is a key part of your treatment and safety. Be sure to make and go to all appointments, and call your doctor if you are having problems. It's also a good idea to know your test results and keep a list of the medicines you take.  How can you care for yourself at home?  Set realistic goals. Many people expect to lose much more weight than is likely. A weight loss of 5% to 10% of your body weight may be enough to improve your health.  Get family and friends involved to provide support. Talk to them about why you are trying to lose weight, and ask them to help. They can help by participating in exercise and having meals with you, even if they may be eating something different.  Find what works best for you. If you do not have time or do not like to cook, a program that offers meal replacement bars or shakes may be better for you. Or if you like to prepare meals, finding a plan that includes daily menus and recipes may be best.  Ask your doctor about other health professionals who can help you achieve your weight loss goals.  A dietitian can help you make healthy changes in your diet.  An exercise specialist or personal trainer can help you develop a safe and effective exercise program.  A counselor or psychiatrist can help you cope with issues such as depression, anxiety, or family problems that can make it hard to focus on  weight loss.  Consider joining a support group for people who are trying to lose weight. Your doctor can suggest groups in your area.  Where can you learn more?  Go to [employer] and enter U357 to learn more about "Starting a Weight Loss Plan: Care Instructions."  Current as of: August 19, 2022  Content Version: 14.2   [address] [place].   Care instructions adapted under license by [facility]. If you have questions about a medical condition or this instruction, always ask your healthcare professional. [employer] disclaims any warranty or liability for your use of this information.           Advance Directives: Care Instructions  Overview  An advance directive is a legal way to state your wishes at the end of your life. It tells your family and your doctor what to do if you can't say what you want.  There are two main types of advance directives. You can change them any time your wishes change.  Living will.  This form tells your family and your doctor your wishes about life support and other treatment. The form is also called a declaration.  Medical power of attorney.  This form lets you name a person to make treatment decisions for you when you can't speak for yourself. This person is called a health care agent (health care proxy, health care surrogate). The form is also called a durable power of attorney for health care.  If you do not have an advance directive, decisions about your medical care may be made by a family member, or by a doctor or a judge who doesn't know you.  It may help to think of an advance directive as a gift to the people who care for you. If you have one, they won't have to make tough decisions by themselves.  For more information, including forms for your state, see the [employer] website ([employer]).  Follow-up care is a key part of your treatment and safety. Be sure to make and go to all appointments, and call your doctor if you are having problems. It's also a good idea to know your test results and keep a list of the medicines you take.  What should you include in an advance directive?  Many states have a unique advance directive form. (It may ask you to address specific issues.) Or you might use a universal form that's approved by many  states.  If your form doesn't tell you what to address, it may be hard to know what to include in your advance directive. Use the questions below to help you get started.  Who do you want to make decisions about your medical care if you are not able to?  What life-support measures do you want if you have a serious illness that gets worse over time or can't be cured?  What are you most afraid of that might happen? (Maybe you're afraid of having pain, losing your independence, or being kept alive by machines.)  Where would you prefer to die? (Your home? A hospital? A nursing home?)  Do you want to donate your organs when you die?  Do you want certain religious practices performed before you die?  When should you call for help?  Be sure to contact your doctor if you have any questions.  Where can you learn more?  Go to [employer] and enter R264 to learn more about "Advance Directives: Care Instructions."  Current as of: October 15, 2022  Content Version: 14.2   [address], [place].   Care instructions adapted under license by [facility]. If you have questions about a medical condition or this instruction, always ask your healthcare professional. [employer] disclaims any warranty or liability for your use of this information.           A Healthy Heart: Care Instructions  Overview     Coronary artery disease, also called heart disease, occurs when a substance called plaque builds up in the vessels that supply oxygen-rich blood to your heart muscle. This can narrow the blood vessels and reduce blood flow. A heart attack happens when blood flow is completely blocked. A high-fat diet, smoking, and other factors increase the risk of heart disease.  Your doctor has found that you have a chance of having heart disease. A heart-healthy lifestyle can help keep your heart healthy and prevent heart disease. This lifestyle includes eating healthy, being active, staying at a weight  that's healthy for you, and not smoking or using tobacco. It also includes taking medicines as directed, managing other health conditions, and trying to get a healthy amount of sleep.  Follow-up care is a key part of your treatment and safety. Be sure to make and go to all appointments, and call your doctor if you are having problems. It's also a good idea to know your test results and keep a list of the medicines you take.  How can you care for yourself at home?  Diet   Use less salt when you cook and eat. This helps lower your blood pressure. Taste food before salting. Add only a little salt when you think you need it. With time, your taste buds will adjust to less salt.    Eat fewer snack items, fast foods, canned soups, and other high-salt, high-fat, processed foods.    Read food labels and try to avoid saturated and trans fats. They increase your risk of heart disease by raising cholesterol levels.    Limit the amount of solid fat--butter, margarine, and shortening--you eat. Use olive, peanut, or canola oil when you cook. Bake, broil, and steam foods instead of frying them.    Eat a variety of fruit and vegetables every day. Dark green, deep orange, red, or yellow fruits and vegetables are especially good for you. Examples include spinach, carrots, peaches, and berries.    Foods high in fiber can reduce your cholesterol and provide important vitamins and minerals. High-fiber foods include whole-grain cereals and breads, oatmeal, beans, brown rice, citrus fruits, and apples.    Eat lean proteins. Heart-healthy proteins include seafood, lean meats and poultry, eggs, beans, peas, nuts, seeds, and soy products.    Limit drinks and foods with added sugar. These include candy, desserts, and soda pop.   Heart-healthy lifestyle   If your doctor recommends it, get more exercise. For many people, walking is a good choice. Or you may want to swim, bike, or do other activities. Bit by bit, increase the time you're  active every day. Try for at least 30 minutes on most days of the week.    Try to quit or cut back on using tobacco and other nicotine products. This includes smoking and vaping. If you need help quitting, talk to your doctor about stop-smoking programs and medicines. These can increase your chances of quitting for good. Quitting is one of the most important things you can do to protect your heart. It is never too late to quit. Try to  avoid secondhand smoke too.    Stay at a weight that's healthy for you. Talk to your doctor if you need help losing weight.    Try to get 7 to 9 hours of sleep each night.    Limit alcohol to 2 drinks a day for men and 1 drink a day for women. Too much alcohol can cause health problems.    Manage other health problems such as diabetes, high blood pressure, and high cholesterol. If you think you may have a problem with alcohol or drug use, talk to your doctor.   Medicines   Take your medicines exactly as prescribed. Call your doctor if you think you are having a problem with your medicine.    If your doctor recommends aspirin, take the amount directed each day. Make sure you take aspirin and not another kind of pain reliever, such as acetaminophen (Tylenol).   When should you call for help?   Call 911 if you have symptoms of a heart attack. These may include:   Chest pain or pressure, or a strange feeling in the chest.    Sweating.    Shortness of breath.    Pain, pressure, or a strange feeling in the back, neck, jaw, or upper belly or in one or both shoulders or arms.    Lightheadedness or sudden weakness.    A fast or irregular heartbeat.   After you call 911, the operator may tell you to chew 1 adult-strength or 2 to 4 low-dose aspirin. Wait for an ambulance. Do not try to drive yourself.  Watch closely for changes in your health, and be sure to contact your doctor if you have any problems.  Where can you learn more?  Go to [employer] and enter  F075 to learn more about "A Healthy Heart: Care Instructions."  Current as of: May 23, 2022  Content Version: 14.2   [address], [place].   Care instructions adapted under license by [facility]. If you have questions about a medical condition or this instruction, always ask your healthcare professional. [employer] disclaims any warranty or liability for your use of this information.      Personalized Preventive Plan for Lee Page - 09/30/2023  Medicare offers a range of preventive health benefits. Some of the tests and screenings are paid in full while other may be subject to a deductible, co-insurance, and/or copay.    Some of these benefits include a comprehensive review of your medical history including lifestyle, illnesses that may run in your family, and various assessments and screenings as appropriate.    After reviewing your medical record and screening and assessments performed today your provider may have ordered immunizations, labs, imaging, and/or referrals for you.  A list of these orders (if applicable) as well as your Preventive Care list are included within your After Visit Summary for your review.    Other Preventive Recommendations:    A preventive eye exam performed by an eye specialist is recommended every 1-2 years to screen for glaucoma; cataracts, macular degeneration, and other eye disorders.  A preventive dental visit is recommended every 6 months.  Try to get at least 150 minutes of exercise per week or 10,000 steps per day on a pedometer .  Order or download the FREE "Exercise & Physical Activity: Your Everyday Guide" from [employer]. Call (443) [phone] or search [employer] online.  You need 1200-1500 mg of calcium and 1000-2000 IU of  vitamin D per day. It is possible to meet your calcium requirement with diet alone, but a vitamin D supplement is usually necessary to meet this goal.  When exposed to the  sun, use a sunscreen that protects against both UVA and UVB radiation with an SPF of 30 or greater. Reapply every 2 to 3 hours or after sweating, drying off with a towel, or swimming.  Always wear a seat belt when traveling in a car. Always wear a helmet when riding a bicycle or motorcycle.

## 2023-10-04 ENCOUNTER — Encounter

## 2023-10-04 LAB — CBC WITH AUTO DIFFERENTIAL
Basophils %: 1 % (ref 0.0–2.0)
Basophils Absolute: 0.1 10*3/uL (ref 0.0–0.2)
Eosinophils %: 3 % (ref 0.5–7.8)
Eosinophils Absolute: 0.2 10*3/uL (ref 0.0–0.8)
Hematocrit: 38 % — ABNORMAL LOW (ref 41.1–50.3)
Hemoglobin: 12.6 g/dL — ABNORMAL LOW (ref 13.6–17.2)
Immature Granulocytes %: 1 % (ref 0.0–5.0)
Immature Granulocytes Absolute: 0.1 10*3/uL (ref 0.0–0.5)
Lymphocytes %: 29 % (ref 13–44)
Lymphocytes Absolute: 1.6 10*3/uL (ref 0.5–4.6)
MCH: 28.9 pg (ref 26.1–32.9)
MCHC: 33.2 g/dL (ref 31.4–35.0)
MCV: 87.2 fL (ref 82–102)
MPV: 9.4 fL (ref 9.4–12.3)
Monocytes %: 9 % (ref 4.0–12.0)
Monocytes Absolute: 0.5 10*3/uL (ref 0.1–1.3)
Neutrophils %: 57 % (ref 43–78)
Neutrophils Absolute: 3.2 10*3/uL (ref 1.7–8.2)
Platelets: 267 10*3/uL (ref 150–450)
RBC: 4.36 M/uL (ref 4.23–5.6)
RDW: 12.9 % (ref 11.9–14.6)
WBC: 5.5 10*3/uL (ref 4.3–11.1)
nRBC: 0 10*3/uL (ref 0.0–0.2)

## 2023-10-04 LAB — COMPREHENSIVE METABOLIC PANEL
ALT: 18 U/L (ref 8–55)
AST: 18 U/L (ref 15–37)
Albumin/Globulin Ratio: 1.2 (ref 1.0–1.9)
Albumin: 3.6 g/dL (ref 3.2–4.6)
Alk Phosphatase: 35 U/L — ABNORMAL LOW (ref 40–129)
Anion Gap: 11 mmol/L (ref 7–16)
BUN: 14 mg/dL (ref 8–23)
CO2: 25 mmol/L (ref 20–29)
Calcium: 9.3 mg/dL (ref 8.8–10.2)
Chloride: 102 mmol/L (ref 98–107)
Creatinine: 0.95 mg/dL (ref 0.80–1.30)
Est, Glom Filt Rate: 87 mL/min/{1.73_m2} (ref >60)
Globulin: 3 g/dL (ref 2.3–3.5)
Glucose: 155 mg/dL — ABNORMAL HIGH (ref 70–99)
Potassium: 3.9 mmol/L (ref 3.5–5.1)
Sodium: 137 mmol/L (ref 136–145)
Total Bilirubin: 0.4 mg/dL (ref 0.0–1.2)
Total Protein: 6.7 g/dL (ref 6.3–8.2)

## 2023-10-04 LAB — LIPID PANEL
Chol/HDL Ratio: 3.4 (ref 0.0–5.0)
Cholesterol, Total: 140 mg/dL (ref 0–200)
HDL: 41 mg/dL (ref 40–60)
LDL Cholesterol: 72 mg/dL (ref 0–100)
Triglycerides: 134 mg/dL (ref 0–150)
VLDL Cholesterol Calculated: 27 mg/dL — ABNORMAL HIGH (ref 6–23)

## 2023-10-04 LAB — HEMOGLOBIN A1C
Estimated Avg Glucose: 156 mg/dL
Hemoglobin A1C: 7.1 % — ABNORMAL HIGH (ref 0–5.6)

## 2023-10-04 LAB — URINALYSIS, MICRO: BACTERIA, URINE: NEGATIVE /[HPF]

## 2023-10-04 LAB — TSH: TSH, 3rd Generation: 2.62 u[IU]/mL (ref 0.270–4.200)

## 2023-10-04 LAB — MICROALBUMIN / CREATININE URINE RATIO
Creatinine, Ur: 156 mg/dL (ref 39.00–259.00)
Microalb, Ur: <1.2 mg/dL (ref 0.00–20.00)

## 2023-10-05 LAB — FERRITIN: Ferritin: 47 ng/mL (ref 8–388)

## 2023-10-07 ENCOUNTER — Encounter: Admit: 2023-10-07 | Discharge: 2023-10-07 | Payer: MEDICARE | Attending: Family | Primary: Family

## 2023-10-07 VITALS — BP 130/82 | Ht 70.0 in | Wt 243.6 lb

## 2023-10-07 DIAGNOSIS — I10 Essential (primary) hypertension: Secondary | ICD-10-CM

## 2023-10-07 MED ORDER — LOSARTAN POTASSIUM 25 MG PO TABS
25 MG | ORAL_TABLET | Freq: Every day | ORAL | 3 refills | Status: DC
Start: 2023-10-07 — End: 2024-01-13

## 2023-10-07 MED ORDER — FENOFIBRIC ACID 135 MG PO CPDR
135 | ORAL_CAPSULE | Freq: Every day | ORAL | 3 refills | 90.00000 days | Status: DC
Start: 2023-10-07 — End: 2024-10-17

## 2023-10-07 MED ORDER — TADALAFIL 5 MG PO TABS
5 MG | ORAL_TABLET | Freq: Every morning | ORAL | 3 refills | Status: DC
Start: 2023-10-07 — End: 2024-01-13

## 2023-10-07 MED ORDER — METFORMIN HCL 1000 MG PO TABS
1000 MG | ORAL_TABLET | Freq: Two times a day (BID) | ORAL | 3 refills | Status: DC
Start: 2023-10-07 — End: 2024-01-13

## 2023-10-07 MED ORDER — FINASTERIDE 5 MG PO TABS
5 MG | ORAL_TABLET | Freq: Every day | ORAL | 3 refills | Status: DC
Start: 2023-10-07 — End: 2024-01-13

## 2023-10-07 MED ORDER — TOUJEO MAX SOLOSTAR 300 UNIT/ML SC SOPN
300 UNIT/ML | SUBCUTANEOUS | 5 refills | Status: DC
Start: 2023-10-07 — End: 2024-01-13

## 2023-10-07 MED ORDER — ATORVASTATIN CALCIUM 80 MG PO TABS
80 | ORAL_TABLET | Freq: Every day | ORAL | 3 refills | Status: AC
Start: 2023-10-07 — End: ?

## 2023-10-07 NOTE — Progress Notes (Signed)
PROGRESS NOTE      Chief Complaint   Patient presents with    Diabetes     Ext lab follow-up       HPI    Primary hypertension: Losartan 25 mg    Type 2 diabetes:  Metformin 1000 mg twice daily, Toujeo 40 unit. Previously on Ozempic but expensive.  Last A1C 6.9%  Fasting glucose 130s-150s. Rx for CGM     Obesity: BMI over 35.Lost a couple of pounds.     Hyperlipidemia: Lipitor 80 mg and fenofibrate 135 mg. LDL 40s.   MPI July 2024 - normal perfusion.  TTE in July 2024 - normal EF, mild MR, and AV sclerosis     Anxiety and depression: Wellbutrin sr 150 mg twice daily, Remeron 15 mg at bedtime (insomnia). Previously followed by psychiatry and psychology.     BPH with decreased urine stream: Referred to urology-Dr. Rozann Lesches- with decreased force of urine stream.  Discussed options and decided on continued medical management with Proscar, Cialis 5 mg, and now Rapaflo. Considering repeat cystoscopy and possibly repeating TURP.        History of kidney stones: S/p retrieval and stent placement     Chronic back pain: surgery x2. Artificial discs placed.  PT earlier this year. Referred for physical therapy - completed and working on home exercises     Restless leg syndrome: gabapentin 600mg  at bed time     History of colon polyps: Recent colonoscopy 01/2023. Benign pathology.  10-year recall recommended. No family history of colonoscopy     Memory problems: names, forgetting where he places items    Thickened toenails: requesting podiatry              Past Medical History, Past Surgical History, Family history, Social History, and Medications were all reviewed and updated as necessary.     Current Outpatient Medications   Medication Sig Dispense Refill    finasteride (PROSCAR) 5 MG tablet Take 1 tablet by mouth daily 90 tablet 3    Insulin Glargine, 2 Unit Dial, (TOUJEO MAX SOLOSTAR) 300 UNIT/ML concentrated injection pen 50 units daily. 15 mL 5    atorvastatin (LIPITOR) 80 MG tablet Take 1 tablet by mouth daily 90 tablet 3     fenofibric acid (TRILIPIX) 135 MG CPDR capsule Take 1 capsule by mouth daily 90 capsule 3    losartan (COZAAR) 25 MG tablet Take 1 tablet by mouth daily 90 tablet 3    metFORMIN (GLUCOPHAGE) 1000 MG tablet Take 1 tablet by mouth 2 times daily (with meals) 180 tablet 3    tadalafil (CIALIS) 5 MG tablet Take 1 tablet by mouth every morning 90 tablet 3    NONFORMULARY Prednisone Eye Drop after cataract surgery      silodosin (RAPAFLO) 8 MG CAPS Take 1 capsule by mouth daily 90 capsule 3    Insulin Pen Needle 32G X 4 MM MISC 1 each by Does not apply route daily 100 each 3    buPROPion (WELLBUTRIN SR) 150 MG extended release tablet Take 1 tablet by mouth 2 times daily 90 tablet 3    gabapentin (NEURONTIN) 300 MG capsule Take 1 capsule by mouth. 2 capsules at bedtime 180 capsule 2    tamsulosin (FLOMAX) 0.4 MG capsule Take 1 capsule by mouth daily 90 capsule 3    mirtazapine (REMERON) 15 MG tablet Take 1 tablet by mouth nightly as needed at bedtime.      azelastine (ASTELIN) 0.1 % nasal spray 2  sprays by Nasal route 2 times daily Use in each nostril as directed      blood glucose test strips (ASCENSIA AUTODISC VI;ONE TOUCH ULTRA TEST VI) strip 1 each daily As needed.      Lancets Ultra Fine MISC by Does not apply route      sodium chloride (OCEAN) 0.65 % nasal spray 1 spray by Nasal route as needed for Congestion OTC as directed      Cyanocobalamin (VITAMIN B 12 PO) Take 2,000 mcg by mouth OTC as directed      aspirin 81 MG EC tablet Take 1 tablet by mouth daily      Omega-3 Fatty Acids (OMEGA 3 PO) Take 500 mcg by mouth OTC as directed       No current facility-administered medications for this visit.     Allergies   Allergen Reactions    Antihistamines, Diphenhydramine-Type Other (See Comments)     Causes depression    Penicillins Hives    Statins Other (See Comments)    Sulfa Antibiotics Hives    Codeine Nausea And Vomiting       ASSESSMENT and PLAN    Reymon was seen today for diabetes.    Diagnoses and all orders  for this visit:    Primary hypertension    Type 2 diabetes mellitus without complication, without long-term current use of insulin (HCC)  -     Insulin Glargine, 2 Unit Dial, (TOUJEO MAX SOLOSTAR) 300 UNIT/ML concentrated injection pen; 50 units daily.  -     metFORMIN (GLUCOPHAGE) 1000 MG tablet; Take 1 tablet by mouth 2 times daily (with meals)  -     Hemoglobin A1C; Future    Pure hypercholesterolemia  -     atorvastatin (LIPITOR) 80 MG tablet; Take 1 tablet by mouth daily  -     fenofibric acid (TRILIPIX) 135 MG CPDR capsule; Take 1 capsule by mouth daily  -     Comprehensive Metabolic Panel; Future    Obesity (BMI 30-39.9)    Benign prostatic hyperplasia with weak urinary stream  -     finasteride (PROSCAR) 5 MG tablet; Take 1 tablet by mouth daily  -     tadalafil (CIALIS) 5 MG tablet; Take 1 tablet by mouth every morning    OSA (obstructive sleep apnea)  -     [facility] - [facility] [place]    Onychomycosis  -     [facility] - Rowan Blase, DPM, [facility], [place]    Anemia, unspecified type  -     CBC with Auto Differential; Future    Memory loss    Current mild episode of major depressive disorder, unspecified whether recurrent (HCC)    Severe obesity (BMI 35.0-35.9 with comorbidity)    Anxiety    Other orders  -     losartan (COZAAR) 25 MG tablet; Take 1 tablet by mouth daily      Reviewed notes from urology, cardiology  Reviewed labs.  A1C goal under 7%.Will work on lifestyle. Consider addition of GLP1 .A1C 3 months  Refer podiatry.   Refer sleep medicine  Will follow bp - goal under 130/80  Will obtain immunizations records.   Encourage exercise, healthy lifestyle choices  MMSE 29/30    Medical problems and test results were reviewed with the patient today.     FOLLOW UP    Return for 3 months nonfasting labs and visit.       REVIEW  OF SYSTEMS    Review of Systems   Constitutional:  Negative for chills, fatigue, fever and unexpected weight change.   HENT:  Negative for  congestion, ear pain, hearing loss, tinnitus and voice change.    Eyes:  Negative for pain, redness and visual disturbance.   Respiratory:  Negative for cough and shortness of breath.    Cardiovascular:  Negative for chest pain, palpitations and leg swelling.   Gastrointestinal:  Negative for abdominal pain, blood in stool, constipation, diarrhea and nausea.   Endocrine: Negative for cold intolerance and heat intolerance.   Genitourinary:  Positive for frequency. Negative for difficulty urinating, dysuria, hematuria and urgency.   Musculoskeletal:  Positive for back pain. Negative for arthralgias and myalgias.   Skin: Negative.  Negative for color change and rash.        Scaly lesion pretibial   Neurological:  Negative for dizziness, weakness, numbness and headaches.   Hematological:  Negative for adenopathy. Does not bruise/bleed easily.   Psychiatric/Behavioral:  Negative for dysphoric mood and sleep disturbance. The patient is not nervous/anxious.        PHYSICAL EXAM    BP 130/82   Ht 1.778 m (5\' 10" )   Wt 110.5 kg (243 lb 9.6 oz)   BMI 34.95 kg/m      Physical Exam  Vitals reviewed.   Constitutional:       General: He is not in acute distress.     Appearance: Normal appearance.   HENT:      Head: Normocephalic and atraumatic.      Right Ear: Tympanic membrane and ear canal normal.      Left Ear: Ear canal normal.      Nose: Nose normal.   Eyes:      Conjunctiva/sclera: Conjunctivae normal.   Neck:      Thyroid: No thyroid mass, thyromegaly or thyroid tenderness.   Cardiovascular:      Rate and Rhythm: Normal rate and regular rhythm.      Pulses:           Radial pulses are 2+ on the right side.        Dorsalis pedis pulses are 2+ on the right side and 2+ on the left side.        Posterior tibial pulses are 2+ on the right side and 2+ on the left side.      Heart sounds: No murmur heard.  Pulmonary:      Effort: Pulmonary effort is normal.      Breath sounds: Normal breath sounds.   Chest:   Breasts:      Right: Normal.      Left: Normal.   Abdominal:      Palpations: Abdomen is soft. There is no hepatomegaly, splenomegaly or mass.      Tenderness: There is no abdominal tenderness.   Genitourinary:     Prostate: Normal.      Rectum: Normal. Guaiac result negative.   Musculoskeletal:      Cervical back: Normal range of motion.      Right lower leg: No edema.      Left lower leg: No edema.   Feet:      Right foot:      Toenail Condition: Right toenails are normal.      Left foot:      Skin integrity: Skin integrity normal.      Toenail Condition: Left toenails are normal.   Lymphadenopathy:      Cervical:  No cervical adenopathy.      Upper Body:      Right upper body: No supraclavicular, axillary or pectoral adenopathy.      Left upper body: No supraclavicular, axillary or pectoral adenopathy.   Skin:     Findings: No rash.      Comments: Thickened toenails   Neurological:      Mental Status: He is alert and oriented to person, place, and time.      Motor: Motor function is intact.      Gait: Gait is intact.   Psychiatric:         Attention and Perception: Attention normal.         Speech: Speech normal.         Behavior: Behavior normal.        Diabetic foot exam:   Left Foot:   Visual Exam: normal   Pulse DP: 2+ (normal)   Filament test: normal sensation    Right Foot:   Visual Exam: normal   Pulse DP: 2+ (normal)   Filament test: normal sensation

## 2023-10-07 NOTE — Progress Notes (Signed)
MMSE    Orientation    1. Year: 2024     Score out of 5 points: 5    Season: Fall    Date: 7th    Day: Thursday    Month: November    2. Where are we?                                       Score out of 5 points: 5   State: [address]: [place]   Town: [place]   Doctor: Justice Deeds   Floor: 3rd    Registration  3. Name 3 objects, then ask the patient to say them back     Ex, apple, tree, book  Repeat all 3 until pt can say them back    Score out of 3 points: 3    Attention and Calculation  4. Spell the word WORLD backwards or subtract 7 from 100 5 times.                                                                  Score out of 5 points: 5  Recall     5. Ask for the names of the 3 objects learned in question 3                                                                   Score out of 3 points: 2  Language  6. Name pen and pencil                           Score out of 2 points: 2  7. Have pt repeat "No ifs, ands or buts"  Score out of 1 point: 1  8. Follow 3 stage command:  Take sheet of paper in R hand, fold in half and place on your lap Score out of 3 points: 3  9. Read and follow the command"Close your eyes"                                                                       Score out of 1 point: 1  10. Have pt write a sentence                     Score of 1 point: 1  11. Have pt copy the overlapping decadrons  Score of 1 point: 1                                         TOTAL SCORE: 29  Less than 24 suggestive of dementia

## 2023-11-22 ENCOUNTER — Encounter: Admit: 2023-11-22 | Admitting: Family

## 2023-11-22 DIAGNOSIS — G2581 Restless legs syndrome: Secondary | ICD-10-CM

## 2023-11-22 DIAGNOSIS — R3912 Poor urinary stream: Secondary | ICD-10-CM

## 2023-11-22 DIAGNOSIS — N401 Enlarged prostate with lower urinary tract symptoms: Secondary | ICD-10-CM

## 2023-11-22 MED ORDER — GABAPENTIN 300 MG PO CAPS
300 MG | ORAL_CAPSULE | ORAL | 2 refills | Status: DC
Start: 2023-11-22 — End: 2024-01-13

## 2023-11-22 MED ORDER — SILODOSIN 8 MG PO CAPS
8 MG | ORAL_CAPSULE | Freq: Every day | ORAL | 3 refills | Status: DC
Start: 2023-11-22 — End: 2023-12-23

## 2023-11-22 NOTE — Telephone Encounter (Signed)
 Can we please verify that losartan is available. It looks like this was requested but was sent in 10/07/2023. Gabapentin sent to pharmacy

## 2023-11-22 NOTE — Telephone Encounter (Signed)
 Duplicate request   Rx sent 10/07/23 qty 90; 3 refills

## 2023-11-22 NOTE — Telephone Encounter (Addendum)
 Refill request   NOV 01/13/24     gabapentin (NEURONTIN) 300 MG capsule     Preferred pharm: [id number] - [place], [place] - [address] - P [phone] - F [phone]

## 2023-12-23 ENCOUNTER — Ambulatory Visit: Admit: 2023-12-23 | Discharge: 2023-12-23 | Payer: MEDICARE | Attending: Registered Nurse | Primary: Family

## 2023-12-23 DIAGNOSIS — N401 Enlarged prostate with lower urinary tract symptoms: Secondary | ICD-10-CM

## 2023-12-23 LAB — AMB POC URINALYSIS DIP STICK AUTO W/O MICRO
Bilirubin, Urine, POC: NEGATIVE
Blood (UA POC): NEGATIVE
Glucose, Urine, POC: NEGATIVE mg/dL
KETONES, Urine, POC: NEGATIVE mg/dL
Leukocyte Esterase, Urine, POC: NEGATIVE
Nitrite, Urine, POC: NEGATIVE
Protein, Urine, POC: NEGATIVE mg/dL
Specific Gravity, Urine, POC: 1.025 (ref 1.001–1.035)
Urobilinogen, POC: 0.2 mg/dL (ref <1.1)
pH, Urine, POC: 7 (ref 4.6–8.0)

## 2023-12-23 LAB — AMB POC PVR, MEAS,POST-VOID RES,US,NON-IMAGING: PVR, POC: 0 mL

## 2023-12-23 MED ORDER — SILODOSIN 8 MG PO CAPS
8 | ORAL_CAPSULE | Freq: Every day | ORAL | 3 refills | Status: DC
Start: 2023-12-23 — End: 2024-12-24

## 2023-12-23 NOTE — Progress Notes (Signed)
[facility]  [address]    [address]  [place], [place] [address]  [phone]          Lee Page  DOB: 07-Jan-1951    Chief Complaint   Patient presents with    Follow-up          HPI     Lee Page is a 73 y.o. male who was referred by Dr. Jolyn Lent for evaluation of BPH on 02/09/23.  Pt reports decreased stream and freq with nocturia times one on Proscar, Uroxatral and Cialis 5mg  qd.  Denies UTI's or hematuria.   Recently moved to area from [place].  Followed by urologist there (Dr. Ronne Binning).  No records available at this time.  Previous procedure for the prostate which he describes as a Nurse, adult".  Originally from [place]. PSA UND on 02/09/23.     Changed tamsulosin to Rapaflo last visit. No sign change. Cont to have PVD and nocturia x1/night. PVR 165 cc at prev OV. Now down to 0 cc today.      Past Medical History:   Diagnosis Date    Anxiety     Depression     Erectile dysfunction     Hypertension     Neuropathy     Obesity     Restless legs syndrome     Sleep apnea     Type 2 diabetes mellitus without complication [facility])      Past Surgical History:   Procedure Laterality Date    APPENDECTOMY  1974?    COLONOSCOPY N/A 02/24/2023    COLONOSCOPY POLYPECTOMY SNARE/COLD BIOPSY performed by Raquel Sarna, MD at [facility] ENDOSCOPY    EYE SURGERY  2000?    Right eye cornea transplant    TONSILLECTOMY  1960?     Current Outpatient Medications   Medication Sig Dispense Refill    silodosin (RAPAFLO) 8 MG CAPS Take 1 capsule by mouth daily 90 capsule 3    gabapentin (NEURONTIN) 300 MG capsule 2 capsules at bedtime 180 capsule 2    finasteride (PROSCAR) 5 MG tablet Take 1 tablet by mouth daily 90 tablet 3    Insulin Glargine, 2 Unit Dial, (TOUJEO MAX SOLOSTAR) 300 UNIT/ML concentrated injection pen 50 units daily. 15 mL 5    atorvastatin (LIPITOR) 80 MG tablet Take 1 tablet by mouth daily 90 tablet 3    fenofibric acid (TRILIPIX) 135 MG CPDR capsule Take 1 capsule by mouth  daily 90 capsule 3    losartan (COZAAR) 25 MG tablet Take 1 tablet by mouth daily 90 tablet 3    metFORMIN (GLUCOPHAGE) 1000 MG tablet Take 1 tablet by mouth 2 times daily (with meals) 180 tablet 3    tadalafil (CIALIS) 5 MG tablet Take 1 tablet by mouth every morning 90 tablet 3    NONFORMULARY Prednisone Eye Drop after cataract surgery      Insulin Pen Needle 32G X 4 MM MISC 1 each by Does not apply route daily 100 each 3    buPROPion (WELLBUTRIN SR) 150 MG extended release tablet Take 1 tablet by mouth 2 times daily 90 tablet 3    mirtazapine (REMERON) 15 MG tablet Take 1 tablet by mouth nightly as needed at bedtime.      azelastine (ASTELIN) 0.1 % nasal spray 2 sprays by Nasal route 2 times daily Use in each nostril as directed      blood glucose test strips (ASCENSIA AUTODISC VI;ONE TOUCH ULTRA TEST VI) strip 1  each daily As needed.      Lancets Ultra Fine MISC by Does not apply route      sodium chloride (OCEAN) 0.65 % nasal spray 1 spray by Nasal route as needed for Congestion OTC as directed      Cyanocobalamin (VITAMIN B 12 PO) Take 2,000 mcg by mouth OTC as directed      aspirin 81 MG EC tablet Take 1 tablet by mouth daily      Omega-3 Fatty Acids (OMEGA 3 PO) Take 500 mcg by mouth OTC as directed       No current facility-administered medications for this visit.     Allergies   Allergen Reactions    Antihistamines, Diphenhydramine-Type Other (See Comments)     Causes depression    Penicillins Hives    Statins Other (See Comments)    Sulfa Antibiotics Hives    Codeine Nausea And Vomiting     Social History     Socioeconomic History    Marital status: Married     Spouse name: Not on file    Number of children: Not on file    Years of education: Not on file    Highest education level: Not on file   Occupational History    Not on file   Tobacco Use    Smoking status: Never     Passive exposure: Yes    Smokeless tobacco: Never    Tobacco comments:     Sporadic tobacco use (1-4x/yr) between 1610-9604?   Vaping  Use    Vaping status: Never Used   Substance and Sexual Activity    Alcohol use: Yes     Comment: Only about 1-3 drinks (various types) per month on average    Drug use: Never    Sexual activity: Not Currently     Partners: Female   Other Topics Concern    Not on file   Social History Narrative    Not on file     Social Determinants of Health     Financial Resource Strain: Low Risk  (01/14/2023)    Overall Financial Resource Strain (CARDIA)     Difficulty of Paying Living Expenses: Not hard at all   Food Insecurity: No Food Insecurity (01/14/2023)    Hunger Vital Sign     Worried About Running Out of Food in the Last Year: Never true     Ran Out of Food in the Last Year: Never true   Transportation Needs: Unknown (01/14/2023)    PRAPARE - Therapist, art (Medical): Not on file     Lack of Transportation (Non-Medical): No   Physical Activity: Inactive (09/29/2023)    Exercise Vital Sign     Days of Exercise per Week: 0 days     Minutes of Exercise per Session: 0 min   Stress: Not on file   Social Connections: Not on file   Intimate Partner Violence: Not on file   Housing Stability: Unknown (01/14/2023)    Housing Stability Vital Sign     Unable to Pay for Housing in the Last Year: Not on file     Number of Places Lived in the Last Year: Not on file     Unstable Housing in the Last Year: No     Family History   Problem Relation Age of Onset    Depression Mother     Diabetes Father         Type II  Heart Disease Father     High Blood Pressure Father     High Cholesterol Father     Obesity Father     Alcohol Abuse Sister     Depression Sister     Obesity Sister     Hearing Loss Sister        Review of Systems  Constitutional:   Negative for fever.  GI:  Negative for nausea.  Genitourinary: Positive for straining and leakage w/ urge.  Musculoskeletal:  Negative for back pain.      Urinalysis  UA - Dipstick  Results for orders placed or performed in visit on 12/23/23   AMB POC URINALYSIS DIP STICK  AUTO W/O MICRO    Collection Time: 12/23/23 11:06 AM   Result Value Ref Range    Color (UA POC)      Clarity (UA POC)      Glucose, Urine, POC Negative Negative mg/dL    Bilirubin, Urine, POC Negative Negative    KETONES, Urine, POC Negative Negative mg/dL    Specific Gravity, Urine, POC 1.025 1.001 - 1.035    Blood (UA POC) Negative     pH, Urine, POC 7.0 4.6 - 8.0    Protein, Urine, POC Negative Negative mg/dL    Urobilinogen, POC 0.2 mg/dL <1.6 mg/dL    Nitrite, Urine, POC Negative Negative    Leukocyte Esterase, Urine, POC Negative Negative       UA - Micro  WBC - 0  RBC - 0  Bacteria - 0  Epith - 0      PHYSICAL EXAM    General appearance - well appearing and in no distress  Mental status - alert, oriented to person, place, and time  Neck - supple, no significant adenopathy  Chest/Lung-  Quiet, even and easy respiratory effort without use of accessory muscles  Skin - normal coloration and turgor, no rashes      Assessment and Plan    ICD-10-CM    1. BPH with obstruction/lower urinary tract symptoms  N40.1 AMB POC URINALYSIS DIP STICK AUTO W/O MICRO    N13.8 AMB POC PVR, MEAS,POST-VOID RES,US,NON-IMAGING      2. Nocturia  R35.1 AMB POC URINALYSIS DIP STICK AUTO W/O MICRO     AMB POC PVR, MEAS,POST-VOID RES,US,NON-IMAGING      3. Post-void dribbling  N39.43           BPH/LUTS/Nocturia/PVD- urine normal. PVR improved. Discussed medication management vs cysto to determine if repeat TURP needed. As for medication. Proscar 5 mg, Cialis 5 mg, and Rapaflo 8 mg PO daliy. Discussed adding Gemtesa 75 mg PO daily. He opts to [facility] for now. Not ready for TURP.     RTO in 6 months for follow up w PVR. Advised to call sooner if sx worsen.        Beverely Risen, APRN - CNP  Dr. Rozann Lesches is supervising physician today and he approves plan of care.

## 2024-01-06 ENCOUNTER — Encounter

## 2024-01-11 ENCOUNTER — Encounter

## 2024-01-11 LAB — COMPREHENSIVE METABOLIC PANEL
ALT: 19 U/L (ref 8–55)
AST: 17 U/L (ref 15–37)
Albumin/Globulin Ratio: 1.3 (ref 1.0–1.9)
Albumin: 3.8 g/dL (ref 3.2–4.6)
Alk Phosphatase: 38 U/L — ABNORMAL LOW (ref 40–129)
Anion Gap: 12 mmol/L (ref 7–16)
BUN: 15 mg/dL (ref 8–23)
CO2: 25 mmol/L (ref 20–29)
Calcium: 9.4 mg/dL (ref 8.8–10.2)
Chloride: 102 mmol/L (ref 98–107)
Creatinine: 0.98 mg/dL (ref 0.80–1.30)
Est, Glom Filt Rate: 83 mL/min/{1.73_m2} (ref >60)
Globulin: 3 g/dL (ref 2.3–3.5)
Glucose: 158 mg/dL — ABNORMAL HIGH (ref 70–99)
Potassium: 3.8 mmol/L (ref 3.5–5.1)
Sodium: 139 mmol/L (ref 136–145)
Total Bilirubin: 0.5 mg/dL (ref 0.0–1.2)
Total Protein: 6.8 g/dL (ref 6.3–8.2)

## 2024-01-11 LAB — HEMOGLOBIN A1C
Estimated Avg Glucose: 176 mg/dL
Hemoglobin A1C: 7.8 % — ABNORMAL HIGH (ref 0–5.6)

## 2024-01-12 LAB — CBC WITH AUTO DIFFERENTIAL
Basophils %: 0.6 % (ref 0.0–2.0)
Basophils Absolute: 0.04 10*3/uL (ref 0.00–0.20)
Eosinophils %: 2.6 % (ref 0.5–7.8)
Eosinophils Absolute: 0.18 10*3/uL (ref 0.00–0.80)
Hematocrit: 42.2 % (ref 41.1–50.3)
Hemoglobin: 14 g/dL (ref 13.6–17.2)
Immature Granulocytes %: 0.6 % (ref 0.0–5.0)
Immature Granulocytes Absolute: 0.04 10*3/uL (ref 0.0–0.5)
Lymphocytes %: 30.2 % (ref 13.0–44.0)
Lymphocytes Absolute: 2.09 10*3/uL (ref 0.50–4.60)
MCH: 28.5 pg (ref 26.1–32.9)
MCHC: 33.2 g/dL (ref 31.4–35.0)
MCV: 85.8 FL (ref 82–102)
MPV: 9.5 FL (ref 9.4–12.3)
Monocytes %: 9.2 % (ref 4.0–12.0)
Monocytes Absolute: 0.64 10*3/uL (ref 0.10–1.30)
Neutrophils %: 56.8 % (ref 43.0–78.0)
Neutrophils Absolute: 3.93 10*3/uL (ref 1.70–8.20)
Platelets: 293 10*3/uL (ref 150–450)
RBC: 4.92 M/uL (ref 4.23–5.6)
RDW: 13 % (ref 11.9–14.6)
WBC: 6.9 10*3/uL (ref 4.3–11.1)
nRBC: 0 10*3/uL (ref 0.0–0.2)

## 2024-01-13 ENCOUNTER — Encounter: Payer: MEDICARE | Attending: Family | Primary: Family

## 2024-01-13 ENCOUNTER — Ambulatory Visit: Admit: 2024-01-13 | Discharge: 2024-01-13 | Payer: MEDICARE | Attending: Family | Primary: Family

## 2024-01-13 VITALS — BP 128/78 | Ht 70.0 in | Wt 244.2 lb

## 2024-01-13 DIAGNOSIS — E119 Type 2 diabetes mellitus without complications: Secondary | ICD-10-CM

## 2024-01-13 DIAGNOSIS — E1165 Type 2 diabetes mellitus with hyperglycemia: Secondary | ICD-10-CM

## 2024-01-13 MED ORDER — METFORMIN HCL 1000 MG PO TABS
1000 | ORAL_TABLET | Freq: Two times a day (BID) | ORAL | 3 refills | Status: DC
Start: 2024-01-13 — End: 2024-10-17

## 2024-01-13 MED ORDER — LOSARTAN POTASSIUM 25 MG PO TABS
25 | ORAL_TABLET | Freq: Every day | ORAL | 3 refills | Status: DC
Start: 2024-01-13 — End: 2024-10-17

## 2024-01-13 MED ORDER — FINASTERIDE 5 MG PO TABS
5 | ORAL_TABLET | Freq: Every day | ORAL | 3 refills | Status: DC
Start: 2024-01-13 — End: 2024-10-17

## 2024-01-13 MED ORDER — AZELASTINE HCL 0.1 % NA SOLN
0.1 | Freq: Two times a day (BID) | NASAL | 5 refills | Status: DC
Start: 2024-01-13 — End: 2024-08-04

## 2024-01-13 MED ORDER — SEMAGLUTIDE(0.25 OR 0.5MG/DOS) 2 MG/3ML SC SOPN
23 | SUBCUTANEOUS | 5 refills | Status: AC
Start: 2024-01-13 — End: ?

## 2024-01-13 MED ORDER — BUPROPION HCL ER (SR) 150 MG PO TB12
150 | ORAL_TABLET | Freq: Two times a day (BID) | ORAL | 3 refills | Status: DC
Start: 2024-01-13 — End: 2024-10-17

## 2024-01-13 MED ORDER — TOUJEO MAX SOLOSTAR 300 UNIT/ML SC SOPN
300 | SUBCUTANEOUS | 5 refills | 60.00000 days | Status: DC
Start: 2024-01-13 — End: 2024-10-17

## 2024-01-13 MED ORDER — TADALAFIL 5 MG PO TABS
5 | ORAL_TABLET | Freq: Every morning | ORAL | 3 refills | 30.00000 days | Status: DC
Start: 2024-01-13 — End: 2024-10-17

## 2024-01-13 MED ORDER — GABAPENTIN 300 MG PO CAPS
300 | ORAL_CAPSULE | ORAL | 2 refills | Status: DC
Start: 2024-01-13 — End: 2024-10-17

## 2024-01-13 NOTE — Progress Notes (Signed)
PROGRESS NOTE      Chief Complaint   Patient presents with    Diabetes     3 month follow-up with lab review       HPI      Primary hypertension: Losartan 25 mg. BP in range     Type 2 diabetes: Metformin 1000 mg twice daily, Toujeo 40 unit. Previously on Ozempic with benefit and insurance will cover this year. A1C up to 7.9% from 7.1%. Would like to get set up on cgm today     Obesity: BMI over 35 - essentially unchanged     Hyperlipidemia: Lipitor 80 mg and fenofibrate 135 mg. LDL to goal   MPI July 2024 - normal perfusion.  TTE in July 2024 - normal EF, mild MR, and AV sclerosis     Anxiety and depression: Wellbutrin sr 150 mg twice daily, Remeron 15 mg at bedtime (insomnia). Previously followed by psychiatry and psychology but feels not necessary. No ideas of self harm.     BPH with decreased urine stream: Referred to urology-Dr. Rozann Lesches. Continues Proscar, Cialis 5 mg, and Rapaflo. Considering repeat cystoscopy and possibly repeating TURP.        History of kidney stones: S/p retrieval and stent placement     Chronic back pain: surgery x2. Artificial discs placed.  PT last year. No current problems.     Restless leg syndrome: gabapentin 600mg  at bed time                   Past Medical History, Past Surgical History, Family history, Social History, and Medications were all reviewed and updated as necessary.     Current Outpatient Medications   Medication Sig Dispense Refill    azelastine (ASTELIN) 0.1 % nasal spray 2 sprays by Nasal route 2 times daily Use in each nostril as directed 30 mL 5    Insulin Glargine, 2 Unit Dial, (TOUJEO MAX SOLOSTAR) 300 UNIT/ML concentrated injection pen 50 units daily. 15 mL 5    tadalafil (CIALIS) 5 MG tablet Take 1 tablet by mouth every morning 90 tablet 3    finasteride (PROSCAR) 5 MG tablet Take 1 tablet by mouth daily 90 tablet 3    gabapentin (NEURONTIN) 300 MG capsule 2 capsules at bedtime 180 capsule 2    losartan (COZAAR) 25 MG tablet Take 1 tablet by mouth daily 90  tablet 3    metFORMIN (GLUCOPHAGE) 1000 MG tablet Take 1 tablet by mouth 2 times daily (with meals) 180 tablet 3    buPROPion (WELLBUTRIN SR) 150 MG extended release tablet Take 1 tablet by mouth 2 times daily 180 tablet 3    Semaglutide,0.25 or 0.5MG /DOS, 2 MG/3ML SOPN 0.25 mg every week for 4 weeks then 0.5 mg weekly 3 mL 5    silodosin (RAPAFLO) 8 MG CAPS Take 1 capsule by mouth daily 90 capsule 3    atorvastatin (LIPITOR) 80 MG tablet Take 1 tablet by mouth daily 90 tablet 3    fenofibric acid (TRILIPIX) 135 MG CPDR capsule Take 1 capsule by mouth daily 90 capsule 3    Insulin Pen Needle 32G X 4 MM MISC 1 each by Does not apply route daily 100 each 3    mirtazapine (REMERON) 15 MG tablet Take 1 tablet by mouth nightly as needed at bedtime.      blood glucose test strips (ASCENSIA AUTODISC VI;ONE TOUCH ULTRA TEST VI) strip 1 each daily As needed.      Lancets Ultra Fine MISC  by Does not apply route      sodium chloride (OCEAN) 0.65 % nasal spray 1 spray by Nasal route as needed for Congestion OTC as directed      Cyanocobalamin (VITAMIN B 12 PO) Take 2,000 mcg by mouth OTC as directed      aspirin 81 MG EC tablet Take 1 tablet by mouth daily      Omega-3 Fatty Acids (OMEGA 3 PO) Take 500 mcg by mouth OTC as directed      NONFORMULARY Prednisone Eye Drop after cataract surgery (Patient not taking: Reported on 01/13/2024)       No current facility-administered medications for this visit.     Allergies   Allergen Reactions    Antihistamines, Diphenhydramine-Type Other (See Comments)     Causes depression    Penicillins Hives    Statins Other (See Comments)    Sulfa Antibiotics Hives    Codeine Nausea And Vomiting       ASSESSMENT and PLAN    Lee Page was seen today for diabetes.    Diagnoses and all orders for this visit:    Type 2 diabetes mellitus with hyperglycemia, without long-term current use of insulin (HCC)  -     Insulin Glargine, 2 Unit Dial, (TOUJEO MAX SOLOSTAR) 300 UNIT/ML concentrated injection pen; 50  units daily.  -     metFORMIN (GLUCOPHAGE) 1000 MG tablet; Take 1 tablet by mouth 2 times daily (with meals)  -     Semaglutide,0.25 or 0.5MG /DOS, 2 MG/3ML SOPN; 0.25 mg every week for 4 weeks then 0.5 mg weekly    Benign prostatic hyperplasia with weak urinary stream  -     tadalafil (CIALIS) 5 MG tablet; Take 1 tablet by mouth every morning  -     finasteride (PROSCAR) 5 MG tablet; Take 1 tablet by mouth daily    Restless leg syndrome  -     gabapentin (NEURONTIN) 300 MG capsule; 2 capsules at bedtime    Anxiety  -     buPROPion (WELLBUTRIN SR) 150 MG extended release tablet; Take 1 tablet by mouth 2 times daily    Primary hypertension  -     losartan (COZAAR) 25 MG tablet; Take 1 tablet by mouth daily    Other orders  -     azelastine (ASTELIN) 0.1 % nasal spray; 2 sprays by Nasal route 2 times daily Use in each nostril as directed    Reviewed notes from urology.  Reviewed labs and discussed significance  Will resume Ozempic. Start CGM.   BP to goal.   Encouraged for plans to make healthy choices.     Medical problems and test results were reviewed with the patient today.     FOLLOW UP    Return for 3 months fasting labs and visit.       REVIEW OF SYSTEMS    Review of Systems   Constitutional:  Positive for unexpected weight change.   Eyes:  Negative for visual disturbance.   Respiratory:  Negative for shortness of breath.    Cardiovascular:  Negative for chest pain, palpitations and leg swelling.   Neurological:  Negative for headaches.       PHYSICAL EXAM    BP 128/78   Ht 1.778 m (5\' 10" )   Wt 110.8 kg (244 lb 3.2 oz)   BMI 35.04 kg/m      Physical Exam  Vitals reviewed.   Constitutional:       Appearance: Normal appearance. He  is not ill-appearing.   Pulmonary:      Effort: Pulmonary effort is normal.   Neurological:      Mental Status: He is alert and oriented to person, place, and time.   Psychiatric:         Mood and Affect: Mood normal.         Thought Content: Thought content normal.

## 2024-03-09 NOTE — Telephone Encounter (Signed)
 Appt notes say pr has CPAP and uses [facility]. Called [facility] to obtain sleep study and DL. Pt was not in [employer]. Attempted to call pt to ask him to bring PAP to appt. No answer.

## 2024-03-10 ENCOUNTER — Ambulatory Visit: Admit: 2024-03-10 | Discharge: 2024-03-10 | Payer: MEDICARE | Attending: Physician Assistant | Primary: Family

## 2024-03-10 VITALS — BP 138/87 | HR 85 | Temp 97.50000°F | Resp 16 | Ht 70.0 in | Wt 244.4 lb

## 2024-03-10 DIAGNOSIS — G4733 Obstructive sleep apnea (adult) (pediatric): Secondary | ICD-10-CM

## 2024-03-10 NOTE — Progress Notes (Signed)
 [facility]  [address]  [place], [place] [address]  [phone]    Patient Name:  Lee Page  Date of Birth:  03/27/1951      Office Visit 03/10/2024    CHIEF COMPLAINT:    Chief Complaint   Patient presents with    Sleep Apnea           New Patient         HISTORY OF PRESENT ILLNESS:  Patient is an 73 y.o. male seen today for new pt evaluation.Pt had a PSG about 10 years ago in [place], [place].  Pt is on CPAP 9-18 cm H2O.   Pt does not have a copy of his sleep study. He is not sure what DME company he has either. He gets phone calls and texts messages frequently but cannot find the name of the DME company right now.   He uses a full face mask. He sleeps with his mouth open so he has to have a full face mask. He does feel like energy is somewhat improved since starting PAP therapy. He does report that he cannot fall asleep with PAP therapy in place. He will go to sleep without CPAP and then will wake up an hour or so later and put his CPAP on. He will then sleep the rest of the night without any issues. He finds that he tosses and turns if he tries to fall asleep with CPAP on initially.   He does take melatonin 5mg  and relaxin qhs. He does think that it helps some. He is on gabapentin. It sounds like he was diagnosed with RLS about 10 years ago. He finds that the gabapentin controls his leg movements. He will occasionally still have leg cramps but not frequently. We discussed a trial of magnesium to help with sleep and leg cramps. Pt is agreeable.   Pt admits that he stays up very late at night. He does not go to bed until midnight to 2am. He wakes up around 7-8am but stays in bed until about 9am. He then will get up. He rarely naps. He reports that he will get tired but if he tries to lie down for a nap, he cannot fall asleep. If he does, it is only for a few minutes.   Pt does feel ok overall. His energy is not bad. He moved to [facility] from [facility]. 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 They moved to [facility] to live in a retirement community.   Pt has good compliance with PAP therapy with use 316/365 days but use > 4 hours has been 123/365 days. Pt's average use has been 3 hours and 30 mins per day. Pt has a mask leak of 0.2L/min with an AHI of 0.9/hr. Pt is benefiting and tolerating PAP therapy. He signed a release form for us  to try to get a copy of his sleep study. He will let me know when he finds his DME company information and we will contact them as well. Otherwise he may need a home sleep study and he is aware of this.           03/07/2024     9:35 AM   Sleep Medicine   Sitting and reading 1   Watching TV 2   Sitting, inactive in a public place (e.g. a theatre or a meeting) 1   As a passenger in a car for an hour without a break 2  Lying down to rest in the afternoon when circumstances permit 2   Sitting and talking to someone 0   Sitting quietly after a lunch without alcohol 1   In a car, while stopped for a few minutes in traffic 0   Epworth Sleepiness Score 9        Patient-reported         Past Medical History:   Diagnosis Date    Anxiety     Depression     Erectile dysfunction     Hypertension     Neuropathy     Obesity     Restless legs syndrome     Sleep apnea     Type 2 diabetes mellitus without complication          Patient Active Problem List   Diagnosis    History of kidney stones    Balance problems    Restless leg syndrome    Anxiety    Chronic bilateral low back pain without sciatica    Current mild episode of major depressive disorder    Severe obesity (BMI 35.0-35.9 with comorbidity)    Pure hypercholesterolemia    Type 2 diabetes mellitus without complication, without long-term current use of insulin    Benign prostatic hyperplasia with weak urinary stream    Primary hypertension    Abnormal echocardiogram    Obesity (BMI 30-39.9)    Mild mitral regurgitation by prior echocardiogram    Chronic venous insufficiency    Memory loss    Anemia    Onychomycosis    OSA  (obstructive sleep apnea)          Past Surgical History:   Procedure Laterality Date    APPENDECTOMY  1974?    COLONOSCOPY N/A 02/24/2023    COLONOSCOPY POLYPECTOMY SNARE/COLD BIOPSY performed by Raquel Sarna, MD at [facility] ENDOSCOPY    EYE SURGERY  2000?    Right eye cornea transplant    TONSILLECTOMY  1960?           Social History     Socioeconomic History    Marital status: Married     Spouse name: Not on file    Number of children: Not on file    Years of education: Not on file    Highest education level: Not on file   Occupational History    Not on file   Tobacco Use    Smoking status: Never     Passive exposure: Yes    Smokeless tobacco: Never    Tobacco comments:     Sporadic tobacco use (1-4x/yr) between 0981-1914?   Vaping Use    Vaping status: Never Used   Substance and Sexual Activity    Alcohol use: Yes     Comment: Only about 1-3 drinks (various types) per month on average    Drug use: Never    Sexual activity: Not Currently     Partners: Female   Other Topics Concern    Not on file   Social History Narrative    Not on file     Social Drivers of Health     Financial Resource Strain: Low Risk  (01/14/2023)    Overall Financial Resource Strain (CARDIA)     Difficulty of Paying Living Expenses: Not hard at all   Food Insecurity: No Food Insecurity (01/10/2024)    Hunger Vital Sign     Worried About Running Out of Food in the Last Year: Never true  Ran Out of Food in the Last Year: Never true   Transportation Needs: No Transportation Needs (01/10/2024)    PRAPARE - Therapist, art (Medical): No     Lack of Transportation (Non-Medical): No   Physical Activity: Inactive (09/29/2023)    Exercise Vital Sign     Days of Exercise per Week: 0 days     Minutes of Exercise per Session: 0 min   Stress: Not on file   Social Connections: Not on file   Intimate Partner Violence: Not on file   Housing Stability: Low Risk  (01/10/2024)    Housing Stability Vital Sign     Unable to Pay for Housing  in the Last Year: No     Number of Times Moved in the Last Year: 0     Homeless in the Last Year: No         Family History   Problem Relation Age of Onset    Depression Mother     Diabetes Father         Type II    Heart Disease Father     High Blood Pressure Father     High Cholesterol Father     Obesity Father     Alcohol Abuse Sister     Depression Sister     Obesity Sister     Hearing Loss Sister          Allergies   Allergen Reactions    Antihistamines, Diphenhydramine-Type Other (See Comments)     Causes depression    Penicillins Hives    Statins Other (See Comments)    Sulfa Antibiotics Hives    Codeine Nausea And Vomiting         Current Outpatient Medications   Medication Sig    azelastine (ASTELIN) 0.1 % nasal spray 2 sprays by Nasal route 2 times daily Use in each nostril as directed    Insulin Glargine, 2 Unit Dial, (TOUJEO MAX SOLOSTAR) 300 UNIT/ML concentrated injection pen 50 units daily.    tadalafil (CIALIS) 5 MG tablet Take 1 tablet by mouth every morning    finasteride (PROSCAR) 5 MG tablet Take 1 tablet by mouth daily    gabapentin (NEURONTIN) 300 MG capsule 2 capsules at bedtime    losartan (COZAAR) 25 MG tablet Take 1 tablet by mouth daily    metFORMIN (GLUCOPHAGE) 1000 MG tablet Take 1 tablet by mouth 2 times daily (with meals)    buPROPion (WELLBUTRIN SR) 150 MG extended release tablet Take 1 tablet by mouth 2 times daily    Semaglutide,0.25 or 0.5MG /DOS, 2 MG/3ML SOPN 0.25 mg every week for 4 weeks then 0.5 mg weekly    silodosin (RAPAFLO) 8 MG CAPS Take 1 capsule by mouth daily    atorvastatin (LIPITOR) 80 MG tablet Take 1 tablet by mouth daily    fenofibric acid (TRILIPIX) 135 MG CPDR capsule Take 1 capsule by mouth daily    Insulin Pen Needle 32G X 4 MM MISC 1 each by Does not apply route daily    mirtazapine (REMERON) 15 MG tablet Take 1 tablet by mouth nightly as needed at bedtime.    blood glucose test strips (ASCENSIA AUTODISC VI;ONE TOUCH ULTRA TEST VI) strip 1 each daily As needed.     Lancets Ultra Fine MISC by Does not apply route    sodium chloride (OCEAN) 0.65 % nasal spray 1 spray by Nasal route as needed for Congestion OTC as directed  Cyanocobalamin (VITAMIN B 12 PO) Take 2,000 mcg by mouth OTC as directed    aspirin 81 MG EC tablet Take 1 tablet by mouth daily    Omega-3 Fatty Acids (OMEGA 3 PO) Take 500 mcg by mouth OTC as directed     No current facility-administered medications for this visit.           REVIEW OF SYSTEMS:   CONSTITUTIONAL:+persistent fatigue, or lethargy/hypersomnolence.   There is no history of fever, chills, night sweats, weight loss, weight gain,    CARDIAC:   No chest pain, pressure, discomfort, palpitations, orthopnea, murmurs, or edema.   GI:   No dysphagia, heartburn reflux, nausea/vomiting, diarrhea, abdominal pain, or bleeding.   NEURO:   There is no history of AMS, persistent headache, decreased level of consciousness, seizures, or motor or sensory deficits.      PHYSICAL EXAM:    Vitals:    03/10/24 1133   BP: 138/87   Pulse: 85   Resp: 16   Temp: 97.5 F (36.4 C)   TempSrc: Infrared   SpO2: 92%  Comment: RA   Weight: 110.9 kg (244 lb 6.4 oz)   Height: 1.778 m (5\' 10" )             GENERAL APPEARANCE:   The patient is obese and in no respiratory distress, on RA.   HEENT:   PERRL.  Conjunctivae unremarkable.   Nasal mucosa is without epistaxis, exudate, or polyps.  Gums and dentition are unremarkable.  There is severe oropharyngeal narrowing.  TMs are clear, mallampati IV.   NECK/LYMPHATIC:   Symmetrical with no elevation of jugular venous pulsation.  Trachea midline.    LUNGS:   Normal respiratory effort with symmetrical lung expansion.   Breath sounds clear.   HEART:   There is a regular rate and rhythm.  No murmur, rub, or gallop.  There is no edema in the lower extremities.   NEURO:   The patient is alert and oriented to person, place, and time.  Memory appears intact and mood is normal.  No gross sensorimotor deficits are  present.          ASSESSMENT:  (Medical Decision Making)      Diagnosis Orders   1. OSA on CPAP -The pathophysiology of obstructive sleep apnea was reviewed with the patient.  It's potential to promote severe neurologic, cardiac, pulmonary, and gastrointestinal problems was discussed. Specifically, the increased incidence of hypertension, coronary artery disease, congestive heart failure, pulmonary hypertension, gastroesophageal reflux, pathologic hypersomnolence, memory loss, and glucose intolerance was related to the consequences of hypoxemia, hypercapnia, airway obstruction, and sympathetic overdrive.  We also discussed the ability of nasal CPAP to correct these abnormalities through maintenance of a patent airway.     Pt signed a release form and we will try to get a copy of his sleep study. He will also let me know when he finds the DME company information and we can try to get a copy of his sleep study from them. If we do not get a copy in the next month or so, he will need a home sleep study to requalify as I can not order supplies until I have a copy of the study.   Pt does benefit from use when he uses it. He will work on compliance and will try Melatonin controlled release to see if it helps him sleep better.        2. RLS (restless legs syndrome) -continue gabapentin at 600mg  nightly  for now, add magnesium supplementation if he would like.        3. Obesity (BMI 30-39.9) -BMI 35            PLAN:  We will try to get a copy of his sleep study and once we do, supplies will be ordered. If we cannot get a copy, he will have to have a home sleep study and then order supplies.   Pt to try controlled release melatonin  Follow up in 4 months or sooner if needed           Collaborating Physician: Dr. Adell Hones     Over 50% of today's office visit was spent in face to face time reviewing test results, prognosis, importance of compliance, education about disease process, benefits of medications, instructions for  management of acute flare-ups, and follow up plans.  Total face to face time spent with patient was 45 minutes.        Seymour Dapper, PA  Electronically signed

## 2024-03-10 NOTE — Patient Instructions (Signed)
I would like you to try extended release or time release melatonin. I would like you to start with 5mg  tablets. You should take the melatonin 60-90 mins before going to bed. This should help you fall asleep but also help you stay asleep without making you groggy. If 5mg  is not enough, you could take 2 tablets to make it 10mg  but I would not recommend going higher on your dose. If it is not working, please call or send me a Clinical cytogeneticist message.

## 2024-03-14 NOTE — Addendum Note (Signed)
 Addended by: Seymour Dapper on: 03/14/2024 09:21 AM     Modules accepted: Orders

## 2024-03-20 NOTE — Telephone Encounter (Addendum)
 The prescription for silodosin  has already has an active prior authorization on file, which will not need to be renewed until September of this year.    Per our Epic/Cover My Meds integrated Prior Authorization system:

## 2024-04-06 ENCOUNTER — Encounter: Payer: MEDICARE | Primary: Family

## 2024-04-13 ENCOUNTER — Encounter: Payer: MEDICARE | Attending: Family | Primary: Family

## 2024-04-18 ENCOUNTER — Encounter

## 2024-04-18 LAB — COMPREHENSIVE METABOLIC PANEL
ALT: 28 U/L (ref 8–55)
AST: 20 U/L (ref 15–37)
Albumin/Globulin Ratio: 1.3 (ref 1.0–1.9)
Albumin: 3.8 g/dL (ref 3.2–4.6)
Alk Phosphatase: 33 U/L — ABNORMAL LOW (ref 40–129)
Anion Gap: 11 mmol/L (ref 7–16)
BUN: 16 mg/dL (ref 8–23)
CO2: 26 mmol/L (ref 20–29)
Calcium: 9.7 mg/dL (ref 8.8–10.2)
Chloride: 102 mmol/L (ref 98–107)
Creatinine: 1.01 mg/dL (ref 0.80–1.30)
Est, Glom Filt Rate: 80 mL/min/{1.73_m2} (ref >60)
Globulin: 2.9 g/dL (ref 2.3–3.5)
Glucose: 115 mg/dL — ABNORMAL HIGH (ref 70–99)
Potassium: 4 mmol/L (ref 3.5–5.1)
Sodium: 139 mmol/L (ref 136–145)
Total Bilirubin: 0.4 mg/dL (ref 0.0–1.2)
Total Protein: 6.7 g/dL (ref 6.3–8.2)

## 2024-04-18 LAB — LIPID PANEL
Chol/HDL Ratio: 3.3 (ref 0.0–5.0)
Cholesterol, Total: 128 mg/dL (ref 0–200)
HDL: 39 mg/dL — ABNORMAL LOW (ref 40–60)
LDL Cholesterol: 67 mg/dL (ref 0–100)
Triglycerides: 113 mg/dL (ref 0–150)
VLDL Cholesterol Calculated: 23 mg/dL (ref 6–23)

## 2024-04-18 LAB — HEMOGLOBIN A1C
Estimated Avg Glucose: 145 mg/dL
Hemoglobin A1C: 6.7 % — ABNORMAL HIGH (ref 0–5.6)

## 2024-04-27 ENCOUNTER — Ambulatory Visit: Admit: 2024-04-27 | Discharge: 2024-04-27 | Payer: MEDICARE | Attending: Family | Primary: Family

## 2024-04-27 VITALS — BP 122/76 | Ht 70.0 in | Wt 242.6 lb

## 2024-04-27 DIAGNOSIS — I10 Essential (primary) hypertension: Secondary | ICD-10-CM

## 2024-04-27 MED ORDER — SEMAGLUTIDE(0.25 OR 0.5MG/DOS) 2 MG/3ML SC SOPN
2 | SUBCUTANEOUS | 5 refills | 28.00000 days | Status: DC
Start: 2024-04-27 — End: 2024-10-17

## 2024-04-27 MED ORDER — ATORVASTATIN CALCIUM 80 MG PO TABS
80 | ORAL_TABLET | Freq: Every day | ORAL | 3 refills | 90.00000 days | Status: DC
Start: 2024-04-27 — End: 2024-10-17

## 2024-04-27 MED ORDER — OMEPRAZOLE 40 MG PO CPDR
40 | ORAL_CAPSULE | Freq: Every day | ORAL | 2 refills | 90.00000 days | Status: DC
Start: 2024-04-27 — End: 2024-07-24

## 2024-04-27 NOTE — Progress Notes (Signed)
 PROGRESS NOTE      Chief Complaint   Patient presents with    Diabetes     3 month follow-up with lab review       Assessment & Plan  Diabetes Mellitus:  - A1c improved to 6.7% from 7.8% in 01/2024 and 7.1% prior  - Continue Toujeo  50 units, metformin  1000 mg BID, Ozempic  0.5 mg  - Avoid prolonged fasting due to hypoglycemia risk  - Maintain current lifestyle and medication adherence    Gastroesophageal Reflux Disease:  - Increased acid reflux post-evening meals  - Avoid eating close to bedtime, consume smaller meals, avoid chocolate, caffeine, peppermint, alcohol  - Raise head of bed  - Prescribe omeprazole 40 mg for a couple of months, discontinue if symptoms resolve  - If symptoms persist, consider reducing Ozempic  dose    Osteoarthritis:  - Pain in DIP and PIP joints of fingers, typical for osteoarthritis  - Use OTC Voltaren gel for relief  - Monitor symptoms and report changes    Irritability:  - Increased irritability, attributed to living situation  - No clinical depression  - Engage in regular physical activity to manage irritability  - Manage stress and avoid letting situation affect well-being    OSA  -compliance with CPAP:  - Saw sleep medicine since last visit  - Recommended adding magnesium; dose unknown, will email via MyChart  - Currently on controlled-release melatonin  - Continue current sleep regimen and monitor for improvements    Cholesterol management:  - LDL well-controlled at 67 mg/dL, HDL slightly below target at 39 mg/dL  - Taking atorvastatin  80 mg and fenofibrate 145 mg  - Continue current medication regimen and maintain healthy lifestyle    Hypertension  -good blood pressure control.Continue current plan    Follow-up:  - Follow up in October unless needed earlier    SUBJECTIVE      History of Present Illness  The patient presents for evaluation of diabetes, acid reflux, sleep issues, and cholesterol management.    Diabetes  - He uses a blood glucose monitor to manage his diabetes,  reporting it as beneficial.  - Current medications: Toujeo  50 units, metformin  1000 mg BID, Ozempic  0.5 mg.  - Slight weight loss noted.  - Blood glucose: 155 today, 160 previously, low 100s upon waking, increased to 160 within 15 minutes.  - No food intake since midnight; pattern of increasing blood glucose despite fasting is regular.  - Often abstains from eating until lunch, yet glucose levels rise.    Acid Reflux  - Increased acid reflux symptoms post-evening meals, described as burning.  - Managed with Tums.  - Similar transient experiences in the past.    Arthritis  - Difficulty flexing two fingers with pain, attributed to arthritis.  - Pain localized to knuckles, not severe but expected to worsen later.    Mood and Irritability  - Increased irritability, no depression but occasional low mood.  - Believes irritability related to living situation in retirement community.    Sleep Issues  - Saw sleep medicine since last visit.  - Recommended adding magnesium; dose unknown, will email via MyChart.  - Also on controlled-release melatonin.    Supplemental information: Cholesterol management was mentioned but no specific details were provided.  Review of Systems   Constitutional:  Positive for fatigue and unexpected weight change.   Eyes:  Negative for visual disturbance.   Respiratory:  Negative for shortness of breath.    Cardiovascular:  Negative for chest  pain, palpitations and leg swelling.   Gastrointestinal:  Negative for blood in stool.        Reflux     Neurological:  Negative for headaches.   Psychiatric/Behavioral:  Positive for sleep disturbance. Negative for dysphoric mood. The patient is nervous/anxious.         OBJECTIVE      BP 122/76   Ht 1.778 m (5\' 10" )   Wt 110 kg (242 lb 9.6 oz)   BMI 34.81 kg/m   Physical Exam  Vitals reviewed.   Constitutional:       Appearance: Normal appearance. He is not ill-appearing.   Pulmonary:      Effort: Pulmonary effort is normal.   Neurological:      Mental  Status: He is alert and oriented to person, place, and time.   Psychiatric:         Mood and Affect: Mood normal.         Thought Content: Thought content normal.           Results  - Labs:    - Metabolic panel: Kidneys, electrolytes, sodium, potassium normal    - Lipid panel: LDL 67, HDL 39, triglycerides normal    - Liver function test: Normal    - A1c: 6.7%       Current Outpatient Medications   Medication Sig Dispense Refill    atorvastatin  (LIPITOR) 80 MG tablet Take 1 tablet by mouth daily 90 tablet 3    Semaglutide ,0.25 or 0.5MG /DOS, 2 MG/3ML SOPN 0.25 mg every week for 4 weeks then 0.5 mg weekly 3 mL 5    omeprazole (PRILOSEC) 40 MG delayed release capsule Take 1 capsule by mouth every morning (before breakfast) 30 capsule 2    azelastine  (ASTELIN ) 0.1 % nasal spray 2 sprays by Nasal route 2 times daily Use in each nostril as directed 30 mL 5    Insulin  Glargine, 2 Unit Dial, (TOUJEO  MAX SOLOSTAR) 300 UNIT/ML concentrated injection pen 50 units daily. 15 mL 5    tadalafil  (CIALIS ) 5 MG tablet Take 1 tablet by mouth every morning 90 tablet 3    finasteride  (PROSCAR ) 5 MG tablet Take 1 tablet by mouth daily 90 tablet 3    gabapentin  (NEURONTIN ) 300 MG capsule 2 capsules at bedtime 180 capsule 2    losartan  (COZAAR ) 25 MG tablet Take 1 tablet by mouth daily 90 tablet 3    metFORMIN  (GLUCOPHAGE ) 1000 MG tablet Take 1 tablet by mouth 2 times daily (with meals) 180 tablet 3    buPROPion  (WELLBUTRIN  SR) 150 MG extended release tablet Take 1 tablet by mouth 2 times daily 180 tablet 3    silodosin  (RAPAFLO ) 8 MG CAPS Take 1 capsule by mouth daily 90 capsule 3    fenofibric acid  (TRILIPIX ) 135 MG CPDR capsule Take 1 capsule by mouth daily 90 capsule 3    Insulin  Pen Needle 32G X 4 MM MISC 1 each by Does not apply route daily 100 each 3    blood glucose test strips (ASCENSIA AUTODISC VI;ONE TOUCH ULTRA TEST VI) strip 1 each daily As needed.      Lancets Ultra Fine MISC by Does not apply route      sodium chloride   (OCEAN) 0.65 % nasal spray 1 spray by Nasal route as needed for Congestion OTC as directed      Cyanocobalamin (VITAMIN B 12 PO) Take 2,000 mcg by mouth OTC as directed      aspirin 81 MG EC  tablet Take 1 tablet by mouth daily      Omega-3 Fatty Acids (OMEGA 3 PO) Take 500 mcg by mouth OTC as directed      mirtazapine (REMERON) 15 MG tablet Take 1 tablet by mouth nightly as needed at bedtime. (Patient not taking: Reported on 04/27/2024)       No current facility-administered medications for this visit.     Allergies   Allergen Reactions    Antihistamines, Diphenhydramine-Type Other (See Comments)     Causes depression    Penicillins Hives    Statins Other (See Comments)    Sulfa Antibiotics Hives    Codeine Nausea And Vomiting       PLAN    Sorren was seen today for diabetes.    Diagnoses and all orders for this visit:    Primary hypertension    Pure hypercholesterolemia  -     atorvastatin  (LIPITOR) 80 MG tablet; Take 1 tablet by mouth daily    Type 2 diabetes mellitus with hyperglycemia, without long-term current use of insulin  (HCC)  -     Semaglutide ,0.25 or 0.5MG /DOS, 2 MG/3ML SOPN; 0.25 mg every week for 4 weeks then 0.5 mg weekly    Restless leg syndrome    Type 2 diabetes mellitus without complication, without long-term current use of insulin  (HCC)    OSA (obstructive sleep apnea)    Gastroesophageal reflux disease without esophagitis  -     omeprazole (PRILOSEC) 40 MG delayed release capsule; Take 1 capsule by mouth every morning (before breakfast)    Primary osteoarthritis involving multiple joints    Current mild episode of major depressive disorder, unspecified whether recurrent      Reviewed labs and discussed significance of findings.  Reviewed notes from sleep medicine.   Is encouraged for diet changes and healthy lifestyle choices.  Will increase regular exercise but can also improve emotional wellbeing.  Avoid prolonged fasting periods.  Continue current antidiabetic regimen.  Will follow blood  pressure.  Goal under 130/80.  Can try PPI for 3 months let me know if the reflux symptoms persist.        FOLLOW UP    Return for AWV VV with fasting labs/EXAM 1-2 weeks later - end of October.     The patient (or guardian, if applicable) and other individuals in attendance with the patient were advised that Artificial Intelligence will be utilized during this visit to record, process the conversation to generate a clinical note, and support improvement of the AI technology. The patient (or guardian, if applicable) and other individuals in attendance at the appointment consented to the use of AI, including the recording.

## 2024-05-10 ENCOUNTER — Encounter

## 2024-05-11 MED ORDER — INSULIN PEN NEEDLE 32G X 4 MM MISC
Freq: Every day | 3 refills | 85.00 days | Status: AC
Start: 2024-05-11 — End: ?

## 2024-05-11 NOTE — Telephone Encounter (Signed)
 Please advise. Patient has changed pharmacies to [facility] and needs insulin  needles sent to new pharmacy. Rx pended.

## 2024-06-21 ENCOUNTER — Encounter: Payer: MEDICARE | Attending: Registered Nurse | Primary: Family

## 2024-07-23 ENCOUNTER — Encounter

## 2024-07-24 ENCOUNTER — Ambulatory Visit: Admit: 2024-07-24 | Discharge: 2024-07-24 | Payer: MEDICARE | Attending: Registered Nurse | Primary: Family

## 2024-07-24 DIAGNOSIS — N138 Other obstructive and reflux uropathy: Principal | ICD-10-CM

## 2024-07-24 LAB — AMB POC URINALYSIS DIP STICK AUTO W/O MICRO
Bilirubin, Urine, POC: NEGATIVE
Blood (UA POC): NEGATIVE
Glucose, Urine, POC: NEGATIVE mg/dL
KETONES, Urine, POC: NEGATIVE mg/dL
Leukocyte Esterase, Urine, POC: NEGATIVE
Nitrite, Urine, POC: NEGATIVE
Protein, Urine, POC: NEGATIVE mg/dL
Specific Gravity, Urine, POC: 1.03 (ref 1.001–1.035)
Urobilinogen, POC: 0.2 mg/dL (ref <1.1)
pH, Urine, POC: 5.5 (ref 4.6–8.0)

## 2024-07-24 LAB — AMB POC PVR, MEAS,POST-VOID RES,US,NON-IMAGING: PVR, POC: 1 cc

## 2024-07-24 MED ORDER — OMEPRAZOLE 40 MG PO CPDR
40 | ORAL_CAPSULE | Freq: Every day | ORAL | 2 refills | 90.00000 days | Status: DC
Start: 2024-07-24 — End: 2024-10-17

## 2024-07-24 NOTE — Telephone Encounter (Signed)
 Please advise. Patient last seen on 04/27/24, follow-up scheduled for 10/03/24. Rx last wrote on 04/27/24 #30 with 2 refill. Rx pended.

## 2024-07-24 NOTE — Progress Notes (Signed)
 [facility]  [address]    [address]  [place] [address]  [phone]          Lee Page  DOB: 02/23/1951    Chief Complaint   Patient presents with    Follow-up          HPI     Lee Page is a 73 y.o. male who was referred by Dr. Lovell-Sherman for evaluation of BPH on 02/09/23.  Pt reports decreased stream and freq with nocturia times one on Proscar , Uroxatral  and Cialis  5mg  qd.  Denies UTI's or hematuria.   Recently moved to area from [place].  Followed by urologist there (Dr. Sherrilee).  No records available at this time.  Previous procedure for the prostate which he describes as a Roto rooter.  Originally from [place] . PSA UND on 02/09/23.      Changed tamsulosin  to Rapaflo  at prev visit. No sign change. PVD is better. Still has bothersome urinary frequency and nocturia x1/night. PVR down to 1 cc today. 165 cc at prev OV. He is having some issues w glucose.         Past Medical History:   Diagnosis Date    Anxiety     Depression     Erectile dysfunction     Hypertension     Neuropathy     Obesity     Restless legs syndrome     Sleep apnea     Type 2 diabetes mellitus without complication [facility])      Past Surgical History:   Procedure Laterality Date    APPENDECTOMY  1974?    COLONOSCOPY N/A 02/24/2023    COLONOSCOPY POLYPECTOMY SNARE/COLD BIOPSY performed by Lee Heidi PENNER, MD at [facility] ENDOSCOPY    EYE SURGERY  2000?    Right eye cornea transplant    TONSILLECTOMY  1960?     Current Outpatient Medications   Medication Sig Dispense Refill    Insulin  Pen Needle 32G X 4 MM MISC 1 each by Does not apply route daily 100 each 3    atorvastatin  (LIPITOR) 80 MG tablet Take 1 tablet by mouth daily 90 tablet 3    Semaglutide ,0.25 or 0.5MG /DOS, 2 MG/3ML SOPN 0.25 mg every week for 4 weeks then 0.5 mg weekly 3 mL 5    omeprazole  (PRILOSEC) 40 MG delayed release capsule Take 1 capsule by mouth every morning (before breakfast) 30 capsule 2    azelastine  (ASTELIN ) 0.1 % nasal  spray 2 sprays by Nasal route 2 times daily Use in each nostril as directed 30 mL 5    Insulin  Glargine, 2 Unit Dial, (TOUJEO  MAX SOLOSTAR) 300 UNIT/ML concentrated injection pen 50 units daily. 15 mL 5    tadalafil  (CIALIS ) 5 MG tablet Take 1 tablet by mouth every morning 90 tablet 3    finasteride  (PROSCAR ) 5 MG tablet Take 1 tablet by mouth daily 90 tablet 3    gabapentin  (NEURONTIN ) 300 MG capsule 2 capsules at bedtime 180 capsule 2    losartan  (COZAAR ) 25 MG tablet Take 1 tablet by mouth daily 90 tablet 3    metFORMIN  (GLUCOPHAGE ) 1000 MG tablet Take 1 tablet by mouth 2 times daily (with meals) 180 tablet 3    buPROPion  (WELLBUTRIN  SR) 150 MG extended release tablet Take 1 tablet by mouth 2 times daily 180 tablet 3    silodosin  (RAPAFLO ) 8 MG CAPS Take 1 capsule by mouth daily 90 capsule 3    fenofibric acid  (TRILIPIX )  135 MG CPDR capsule Take 1 capsule by mouth daily 90 capsule 3    blood glucose test strips (ASCENSIA AUTODISC VI;ONE TOUCH ULTRA TEST VI) strip 1 each daily As needed.      Lancets Ultra Fine MISC by Does not apply route      sodium chloride  (OCEAN) 0.65 % nasal spray 1 spray by Nasal route as needed for Congestion OTC as directed      Cyanocobalamin (VITAMIN B 12 PO) Take 2,000 mcg by mouth OTC as directed      aspirin 81 MG EC tablet Take 1 tablet by mouth daily      Omega-3 Fatty Acids (OMEGA 3 PO) Take 500 mcg by mouth OTC as directed      mirtazapine (REMERON) 15 MG tablet Take 1 tablet by mouth nightly as needed at bedtime. (Patient not taking: Reported on 07/24/2024)       No current facility-administered medications for this visit.     Allergies   Allergen Reactions    Antihistamines, Diphenhydramine-Type Other (See Comments)     Causes depression    Penicillins Hives    Statins Other (See Comments)    Sulfa Antibiotics Hives    Codeine Nausea And Vomiting     Social History     Socioeconomic History    Marital status: Married     Spouse name: Not on file    Number of children: Not on  file    Years of education: Not on file    Highest education level: Not on file   Occupational History    Not on file   Tobacco Use    Smoking status: Never     Passive exposure: Yes    Smokeless tobacco: Never    Tobacco comments:     Sporadic tobacco use (1-4x/yr) between 8020-8001?   Vaping Use    Vaping status: Never Used   Substance and Sexual Activity    Alcohol use: Yes     Comment: Only about 1-3 drinks (various types) per month on average    Drug use: Never    Sexual activity: Not Currently     Partners: Female   Other Topics Concern    Not on file   Social History Narrative    Not on file     Social Drivers of Health     Financial Resource Strain: Low Risk  (01/14/2023)    Overall Financial Resource Strain (CARDIA)     Difficulty of Paying Living Expenses: Not hard at all   Food Insecurity: No Food Insecurity (01/10/2024)    Hunger Vital Sign     Worried About Running Out of Food in the Last Year: Never true     Ran Out of Food in the Last Year: Never true   Transportation Needs: No Transportation Needs (01/10/2024)    PRAPARE - Therapist, art (Medical): No     Lack of Transportation (Non-Medical): No   Physical Activity: Inactive (09/29/2023)    Exercise Vital Sign     Days of Exercise per Week: 0 days     Minutes of Exercise per Session: 0 min   Stress: No Stress Concern Present (08/05/2022)    Received from [facility] - Occupational Stress Questionnaire     Feeling of Stress : Not at all   Social Connections: Unknown (01/05/2022)    Received from [facility]    Social Connection and  Isolation Panel     In a typical week, how many times do you talk on the phone with family, friends, or neighbors?: Twice a week     How often do you get together with friends or relatives?: Patient declined     How often do you attend church or religious services?: Never     Do you belong to any clubs or organizations such as church groups, unions, fraternal  or athletic groups, or school groups?: No     Attends Banker Meetings: Not on file     Are you married, widowed, divorced, separated, never married, or living with a partner?: Married   Intimate Partner Violence: Not At Risk (08/05/2022)    Received from [facility]    Humiliation, Afraid, Rape, and Kick questionnaire     Within the last year, have you been afraid of your partner or ex-partner?: No     Within the last year, have you been humiliated or emotionally abused in other ways by your partner or ex-partner?: No     Within the last year, have you been kicked, hit, slapped, or otherwise physically hurt by your partner or ex-partner?: No     Within the last year, have you been raped or forced to have any kind of sexual activity by your partner or ex-partner?: No   Housing Stability: Low Risk  (01/10/2024)    Housing Stability Vital Sign     Unable to Pay for Housing in the Last Year: No     Number of Times Moved in the Last Year: 0     Homeless in the Last Year: No     Family History   Problem Relation Age of Onset    Depression Mother     Diabetes Father         Type II    Heart Disease Father     High Blood Pressure Father     High Cholesterol Father     Obesity Father     Alcohol Abuse Sister     Depression Sister     Obesity Sister     Hearing Loss Sister        Review of Systems  Constitutional:   Negative for fever.  GI:  Negative for nausea.  Genitourinary: Positive for nocturia and frequent urination.  Musculoskeletal:  Negative for back pain.      Urinalysis  UA - Dipstick  Results for orders placed or performed in visit on 07/24/24   AMB POC URINALYSIS DIP STICK AUTO W/O MICRO    Collection Time: 07/24/24  8:12 AM   Result Value Ref Range    Color (UA POC)      Clarity (UA POC)      Glucose, Urine, POC Negative Negative mg/dL    Bilirubin, Urine, POC Negative Negative    KETONES, Urine, POC Negative Negative mg/dL    Specific Gravity, Urine, POC 1.030 1.001 - 1.035    Blood (UA POC) Negative      pH, Urine, POC 5.5 4.6 - 8.0    Protein, Urine, POC Negative Negative mg/dL    Urobilinogen, POC 0.2 mg/dL <8.8 mg/dL    Nitrite, Urine, POC Negative Negative    Leukocyte Esterase, Urine, POC Negative Negative       UA - Micro  WBC - 0  RBC - 0  Bacteria - 0  Epith - 0    PHYSICAL EXAM    General appearance - well appearing and  in no distress  Mental status - alert, oriented to person, place, and time  Neck - supple, no significant adenopathy  Chest/Lung-  Quiet, even and easy respiratory effort without use of accessory muscles  Skin - normal coloration and turgor, no rashes      Assessment and Plan    ICD-10-CM    1. BPH with obstruction/lower urinary tract symptoms  N40.1 AMB POC URINALYSIS DIP STICK AUTO W/O MICRO    N13.8 AMB POC PVR, MEAS,POST-VOID RES,US ,NON-IMAGING      2. Nocturia  R35.1 AMB POC URINALYSIS DIP STICK AUTO W/O MICRO     AMB POC PVR, MEAS,POST-VOID RES,US ,NON-IMAGING      3. Post-void dribbling  N39.43 AMB POC URINALYSIS DIP STICK AUTO W/O MICRO     AMB POC PVR, MEAS,POST-VOID RES,US ,NON-IMAGING      4. Urine frequency  R35.0           BPH/LUTS/Nocturia/urinary frequency- urine normal. PVR improved. Discussed medication management vs cysto to determine if repeat TURP needed. As for medication. Proscar  5 mg, Cialis  5 mg, and Rapaflo  8 mg PO daliy. Discussed adding Gemtesa 75 mg PO daily. He opts to [facility] for now. Not ready for TURP.    PVD- improved. Cont to follow.     RTO in Jan/Feb 2026. Advised to contact office sooner if needed.      Rosina KATHEE Mole, APRN - CNP  Dr. Debbora is supervising physician today and he approves plan of care.

## 2024-08-04 ENCOUNTER — Ambulatory Visit: Admit: 2024-08-04 | Discharge: 2024-08-04 | Payer: MEDICARE | Attending: Physician Assistant | Primary: Family

## 2024-08-04 VITALS — BP 116/76 | HR 77 | Ht 70.0 in | Wt 251.0 lb

## 2024-08-04 DIAGNOSIS — G4733 Obstructive sleep apnea (adult) (pediatric): Principal | ICD-10-CM

## 2024-08-04 MED ORDER — TRAZODONE HCL 50 MG PO TABS
50 | ORAL_TABLET | Freq: Every evening | ORAL | 5 refills | 30.00000 days | Status: AC
Start: 2024-08-04 — End: ?

## 2024-08-04 MED ORDER — AZELASTINE HCL 0.1 % NA SOLN
0.1 | Freq: Two times a day (BID) | NASAL | 11 refills | Status: AC
Start: 2024-08-04 — End: ?

## 2024-08-04 NOTE — Progress Notes (Signed)
 [facility]  [address]  [place], [place] [address]  [phone]    Patient Name:  Lee Page  Date of Birth:  [birth year]      Office Visit 08/04/2024    CHIEF COMPLAINT:    Chief Complaint   Patient presents with    Sleep Apnea    Follow-up         HISTORY OF PRESENT ILLNESS:   History of Present Illness   Patient is an 73 y.o. male seen today for follow up of OSA. Pt had a PSG/HST on 04/20/18 with an AHI of 14.4/hr with desaturations to 79%. Pt is on CPAP 9-18 cm H2O.     He reports difficulty falling asleep, often staying awake until 2 AM, engaging in activities such as watching TV or using the computer. Even when attempting to sleep earlier, he experiences tossing and turning, leading to frustration and eventual wakefulness. He has been using a CPAP machine regularly since his last visit, which initially helped him fall asleep. However, he now finds it difficult to sleep with the machine on and mentions a persistent major leak message on the screen despite attempts to fix it. He has not ordered new supplies for the machine in some time. He prefers the CPAP machine to start at full pressure and likes the current temperature setting. He takes melatonin extended release around 10 PM and also takes magnesium. He has discontinued mirtazapine due to concerns about weight gain and is open to trying prescription medication to aid sleep. He has previously taken trazodone  for depression and anxiety but does not recall its effectiveness. He is currently taking bupropion , which also aids his sleep.    He experiences frequent sneezing and nasal drainage, which he manages with occasional use of nasal sprays or sinus rinses, providing temporary relief. He underwent surgery for these issues two years ago in [place]  and does not believe further treatment is necessary. He has tested negative for allergies.    He is unable to sleep on his back, a position he could comfortably maintain 10 to  15 years ago. He believes sleeping on his back would be more relaxing.    PAST SURGICAL HISTORY: Surgery for nasal issues in [place] , 2 years ago.    Pt has used PAP therapy 46/145 days with an average use of 2 hrs and 14 mins per day. Pt has a mask leak of 0L/min with an AHI of 0.8/hr. PT is benefiting and tolerating PAP therapy when he uses it. His pressure was adjusted to 9-14cm H2O on his home machine.           08/04/2024    10:03 AM 08/03/2024    11:09 AM 03/07/2024     9:35 AM   Sleep Medicine   Sitting and reading 1 1 1    Watching TV 2 2 2    Sitting, inactive in a public place (e.g. a theatre or a meeting) 1 1 1    As a passenger in a car for an hour without a break 3 3 2    Lying down to rest in the afternoon when circumstances permit 2 2 2    Sitting and talking to someone 0 0 0   Sitting quietly after a lunch without alcohol 0 0 1   In a car, while stopped for a few minutes in traffic 0 0 0   Epworth Sleepiness Score 9 9  9         Patient-reported  Past Medical History:   Diagnosis Date    Anxiety     Depression     Erectile dysfunction     Hypertension     Neuropathy     Obesity     Restless legs syndrome     Sleep apnea     Type 2 diabetes mellitus without complication [facility])          Patient Active Problem List   Diagnosis    History of kidney stones    Balance problems    Restless leg syndrome    Anxiety    Chronic bilateral low back pain without sciatica    Current mild episode of major depressive disorder    Severe obesity (BMI 35.0-35.9 with comorbidity) (HCC)    Pure hypercholesterolemia    Type 2 diabetes mellitus without complication, without long-term current use of insulin  (HCC)    Benign prostatic hyperplasia with weak urinary stream    Primary hypertension    Abnormal echocardiogram    Obesity (BMI 30-39.9)    Mild mitral regurgitation by prior echocardiogram    Chronic venous insufficiency    Memory loss    Anemia    Onychomycosis    OSA (obstructive sleep apnea)          Past Surgical  History:   Procedure Laterality Date    APPENDECTOMY  1974?    COLONOSCOPY N/A 02/24/2023    COLONOSCOPY POLYPECTOMY SNARE/COLD BIOPSY performed by Verne Heidi PENNER, MD at [facility] ENDOSCOPY    EYE SURGERY  2000?    Right eye cornea transplant    TONSILLECTOMY  1960?           Social History     Socioeconomic History    Marital status: Married     Spouse name: Not on file    Number of children: Not on file    Years of education: Not on file    Highest education level: Not on file   Occupational History    Not on file   Tobacco Use    Smoking status: Never     Passive exposure: Yes    Smokeless tobacco: Never    Tobacco comments:     Sporadic tobacco use (1-4x/yr) between 8020-8001?   Vaping Use    Vaping status: Never Used   Substance and Sexual Activity    Alcohol use: Yes     Comment: Only about 1-3 drinks (various types) per month on average    Drug use: Never    Sexual activity: Not Currently     Partners: Female   Other Topics Concern    Not on file   Social History Narrative    Not on file     Social Drivers of Health     Financial Resource Strain: Low Risk  (01/14/2023)    Overall Financial Resource Strain (CARDIA)     Difficulty of Paying Living Expenses: Not hard at all   Food Insecurity: No Food Insecurity (01/10/2024)    Hunger Vital Sign     Worried About Running Out of Food in the Last Year: Never true     Ran Out of Food in the Last Year: Never true   Transportation Needs: No Transportation Needs (01/10/2024)    PRAPARE - Therapist, art (Medical): No     Lack of Transportation (Non-Medical): No   Physical Activity: Inactive (09/29/2023)    Exercise Vital Sign     Days of Exercise per  Week: 0 days     Minutes of Exercise per Session: 0 min   Stress: No Stress Concern Present (08/05/2022)    Received from [facility] - Occupational Stress Questionnaire     Feeling of Stress : Not at all   Social Connections: Unknown (01/05/2022)    Received from  [facility]    Social Connection and Isolation Panel     In a typical week, how many times do you talk on the phone with family, friends, or neighbors?: Twice a week     How often do you get together with friends or relatives?: Patient declined     How often do you attend church or religious services?: Never     Do you belong to any clubs or organizations such as church groups, unions, fraternal or athletic groups, or school groups?: No     Attends Banker Meetings: Not on file     Are you married, widowed, divorced, separated, never married, or living with a partner?: Married   Intimate Partner Violence: Not At Risk (08/05/2022)    Received from [facility]    Humiliation, Afraid, Rape, and Kick questionnaire     Within the last year, have you been afraid of your partner or ex-partner?: No     Within the last year, have you been humiliated or emotionally abused in other ways by your partner or ex-partner?: No     Within the last year, have you been kicked, hit, slapped, or otherwise physically hurt by your partner or ex-partner?: No     Within the last year, have you been raped or forced to have any kind of sexual activity by your partner or ex-partner?: No   Housing Stability: Low Risk  (01/10/2024)    Housing Stability Vital Sign     Unable to Pay for Housing in the Last Year: No     Number of Times Moved in the Last Year: 0     Homeless in the Last Year: No         Family History   Problem Relation Age of Onset    Depression Mother     Diabetes Father         Type II    Heart Disease Father     High Blood Pressure Father     High Cholesterol Father     Obesity Father     Alcohol Abuse Sister     Depression Sister     Obesity Sister     Hearing Loss Sister          Allergies   Allergen Reactions    Antihistamines, Diphenhydramine-Type Other (See Comments)     Causes depression    Penicillins Hives    Statins Other (See Comments)    Sulfa Antibiotics Hives    Codeine Nausea And Vomiting         Current  Outpatient Medications   Medication Sig    traZODone  (DESYREL ) 50 MG tablet Take 1 tablet by mouth nightly    azelastine  (ASTELIN ) 0.1 % nasal spray 2 sprays by Nasal route 2 times daily Use in each nostril as directed    omeprazole  (PRILOSEC) 40 MG delayed release capsule TAKE ONE CAPSULE BY MOUTH EVERY MORNING BEFORE BREAKFAST    Insulin  Pen Needle 32G X 4 MM MISC 1 each by Does not apply route daily    atorvastatin  (LIPITOR) 80 MG tablet Take 1 tablet  by mouth daily    Semaglutide ,0.25 or 0.5MG /DOS, 2 MG/3ML SOPN 0.25 mg every week for 4 weeks then 0.5 mg weekly    Insulin  Glargine, 2 Unit Dial, (TOUJEO  MAX SOLOSTAR) 300 UNIT/ML concentrated injection pen 50 units daily.    tadalafil  (CIALIS ) 5 MG tablet Take 1 tablet by mouth every morning    finasteride  (PROSCAR ) 5 MG tablet Take 1 tablet by mouth daily    gabapentin  (NEURONTIN ) 300 MG capsule 2 capsules at bedtime    losartan  (COZAAR ) 25 MG tablet Take 1 tablet by mouth daily    metFORMIN  (GLUCOPHAGE ) 1000 MG tablet Take 1 tablet by mouth 2 times daily (with meals)    buPROPion  (WELLBUTRIN  SR) 150 MG extended release tablet Take 1 tablet by mouth 2 times daily    silodosin  (RAPAFLO ) 8 MG CAPS Take 1 capsule by mouth daily    fenofibric acid  (TRILIPIX ) 135 MG CPDR capsule Take 1 capsule by mouth daily    blood glucose test strips (ASCENSIA AUTODISC VI;ONE TOUCH ULTRA TEST VI) strip 1 each daily As needed.    Lancets Ultra Fine MISC by Does not apply route    sodium chloride  (OCEAN) 0.65 % nasal spray 1 spray by Nasal route as needed for Congestion OTC as directed    Cyanocobalamin (VITAMIN B 12 PO) Take 2,000 mcg by mouth OTC as directed    aspirin 81 MG EC tablet Take 1 tablet by mouth daily    Omega-3 Fatty Acids (OMEGA 3 PO) Take 500 mcg by mouth OTC as directed     No current facility-administered medications for this visit.           REVIEW OF SYSTEMS:   CONSTITUTIONAL:   There is no history of fever, chills, night sweats, weight loss, weight gain,  persistent fatigue, or lethargy/hypersomnolence.   CARDIAC:   No chest pain, pressure, discomfort, palpitations, orthopnea, murmurs, or edema.   GI:   No dysphagia, heartburn reflux, nausea/vomiting, diarrhea, abdominal pain, or bleeding.   NEURO:   There is no history of AMS, persistent headache, decreased level of consciousness, seizures, or motor or sensory deficits.      PHYSICAL EXAM:    Vitals:    08/04/24 1003   BP: 116/76   BP Site: Right Upper Arm   Patient Position: Sitting   BP Cuff Size: Large Adult   Pulse: 77   SpO2: 92%  Comment: RA   Weight: 113.9 kg (251 lb)   Height: 1.778 m (5' 10)             GENERAL APPEARANCE:   The patient is obese and in no respiratory distress, on RA.   HEENT:   PERRL.  Conjunctivae unremarkable.   Nasal mucosa is without epistaxis, exudate, or polyps.  Gums and dentition are unremarkable.    NECK/LYMPHATIC:   Symmetrical with no elevation of jugular venous pulsation.  Trachea midline.    LUNGS:   Normal respiratory effort with symmetrical lung expansion.   Breath sounds clear.   HEART:   There is a regular rate and rhythm.  No murmur, rub, or gallop.  There is no edema in the lower extremities.   NEURO:   The patient is alert and oriented to person, place, and time.  Memory appears intact and mood is normal.  No gross sensorimotor deficits are present.          ASSESSMENT:  (Medical Decision Making)      Diagnosis Orders   1. OSA on  CPAP -pt's pressure adjusted to 9-14cm H2O on his machine. Supplies ordered. He will work on improving compliance and hopefully starting trazodone  will help him get to sleep sooner and help keep him asleep.  DME - DURABLE MEDICAL EQUIPMENT    [facility] [place] [facility], [place]      2. RLS (restless legs syndrome) -continue gabapentin         3. Insomnia, unspecified type -continue melatonin and magnesium, add trazodone . Pt stopped mirtazapine due to concern for weight gain.        4. Rhinorrhea -refer to ENT [facility] [place] [facility], [place]      5.  Sinus congestion  [facility] [place] [facility], [place]         Assessment & Plan  1. Sleep issues: Chronic.  - Reduce CPAP pressure settings to a range of 9 to 14.  - Order new CPAP supplies.  - Prescribe trazodone , ensuring no interaction with other medications.    2. Nasal drainage: Chronic.  - Refer to ENT specialist for further evaluation and potential treatment adjustments.  - Refill azelastine  nasal spray.    3. Deviated septum: Post-surgical.  - Continue using CPAP machine and nasal sprays as recommended by ENT specialist.    Follow-up  - Referral to ENT specialist.  -CPAP pressure changed to 9-14cm H2O  Supplies ordered  Start trazodone  nightly  Resume astelin  nasal spray BID  Follow up in 6 months or sooner if needed           Orders Placed This Encounter   Procedures    [facility] [place] [facility], [facility]     Referral Priority:   Routine     Referral Type:   Eval and Treat     Referral Reason:   Specialty Services Required     Requested Specialty:   Otolaryngology     Number of Visits Requested:   1    DME - DURABLE MEDICAL EQUIPMENT     [facility]  Phone: [address]  [place] [address]  Dept: [phone]      Patient Name: Lee Page  DOB: Aug 28, 1951  Gender: male  Address: [address]  [address]  [place] [address]  Patient phone: [phone] (home)       Primary Insurance: Payor: MEDICARE / Plan: MEDICARE PART A AND B / Product Type: *No Product type* /   Subscriber ID: [id number] - (Medicare)      AMB Supply Order  Order Details     DME Location:[facility]   Order Date: 08/04/2024   There were no encounter diagnoses.             (  X   )Supplies Needed        Machine   [phone] CPAP Unit  (     [phone] Auto CPAP Unit  (     )E0470 BiLevel Unit  (     )E0470 Auto BiLevel Unit  (     )E0471 ASV   (     )E0471 Bilevel ST      Length of need: 12 months    Pressure:   cmH20  EPR:      Starting Ramp Pressure:   cm H20  Ramp Time: min      Patient had a  diagnostic Apnea Hypopnea Index (AHI) of :    *SUPPLIES* Replace all as needed, or per coverage guidelines  Masks Type:  ( x   ) A7030-Full Face Mask (1 per 3 mon)  (  x  ) A7031-Full Mask (1 per month) Interface/Cushion      (  ) A7034-Nasal Mask (1 per 3 mon)  (  ) J2967- Nasal Mask (1 per month) Interface/Cushion  (     ) A7033-Pillow (2 per mon)  (     ) A7036-Chinstrap (1 per 6 mon)            Other Supplies:    (   X  )A7035-Headgear (1 per 6 mon)  (   X  )A4604-Heated Tubing (1 per 3 mon)  (   X  )J2961- Disposable Filter (2 per mon)  (   x  )E0562-Heated Humidifier (1 per year)     ( x    )A7036-Chinstrap (sometimes used with Full Face Mask) (1 per 6 mos)  (    )A7037-Tubing-without heat (1 per 3 mos)  (     )A7039-Non-Disposable Filter (1 per 6 mos)  (  x   )A7046-Water Chamber (1 per 6 mos)  (     )E0561-Humidifier non-heated (1 per 5 yrs)      Signed Date: 08/04/2024  Electronically Signed By: Delon LITTIE Magnus, PA  Electronically Dated:  08/04/2024     Orders Placed This Encounter   Medications    traZODone  (DESYREL ) 50 MG tablet     Sig: Take 1 tablet by mouth nightly     Dispense:  30 tablet     Refill:  5    azelastine  (ASTELIN ) 0.1 % nasal spray     Sig: 2 sprays by Nasal route 2 times daily Use in each nostril as directed     Dispense:  30 mL     Refill:  11         Collaborating Physician: Dr. Raymona    Over 50% of today's office visit was spent in face to face time reviewing test results, prognosis, importance of compliance, education about disease process, benefits of medications, instructions for management of acute flare-ups, and follow up plans.  Total face to face time spent with patient was 40 minutes.    The patient (or guardian, if applicable) and other individuals in attendance with the patient were advised that Artificial Intelligence will be utilized during this visit to record, process the conversation to generate a clinical note, and support improvement of the AI technology. The patient  (or guardian, if applicable) and other individuals in attendance at the appointment consented to the use of AI, including the recording.                    Delon LITTIE Magnus, PA  Electronically signed

## 2024-09-22 ENCOUNTER — Encounter

## 2024-09-28 ENCOUNTER — Ambulatory Visit: Admit: 2024-09-28 | Discharge: 2024-09-28 | Payer: MEDICARE | Attending: Otolaryngology | Primary: Family

## 2024-09-28 ENCOUNTER — Encounter

## 2024-09-28 MED ORDER — IPRATROPIUM BROMIDE 0.06 % NA SOLN
0.06 | Freq: Three times a day (TID) | NASAL | 5 refills | 40.00000 days | Status: AC | PRN
Start: 2024-09-28 — End: ?

## 2024-09-28 NOTE — Progress Notes (Signed)
 "[facility] Office Note    Patient: Lee Page  MRN: [id number]  DOB: 09/13/1951  Gender:  male  Vital Signs: BP 136/80 (BP Site: Left Upper Arm, Patient Position: Sitting)   Ht 1.778 m (5' 10)   Wt 112.3 kg (247 lb 9.6 oz)   BMI 35.53 kg/m   Date: 09/28/2024    CC:   Chief Complaint   Patient presents with    New Patient     Pt presents for vertigo and some hearing loss when in crowded areas.       HPI:  Lee Page is a 73 y.o. male here for evaluation of dizziness and hearing loss.  Notes from referring provider reviewed. Pt had a PSG/HST on 04/20/18 with an AHI of 14.4/hr with desaturations to 79%. Pt is on CPAP 9-18 cm H2O. He experiences frequent sneezing and nasal drainage, which he manages with occasional use of nasal sprays or sinus rinses, providing temporary relief. He underwent surgery for these issues two years ago in [place]  and does not believe further treatment is necessary. He has tested negative for allergies.   He endorses dizziness and disequilibrium.  He denies true vertigo.  He endorses hearing loss and tinnitus.  He primarily describes his dizziness as lightheadedness when getting up quickly.  Past Medical History, Past Surgical History, Family history, Social History, and Medications were all reviewed with the patient today and updated as necessary.     Allergies   Allergen Reactions    Antihistamines, Diphenhydramine-Type Other (See Comments)     Causes depression    Penicillins Hives    Statins Other (See Comments)    Sulfa Antibiotics Hives    Codeine Nausea And Vomiting     Patient Active Problem List   Diagnosis    History of kidney stones    Balance problems    Restless leg syndrome    Anxiety    Chronic bilateral low back pain without sciatica    Current mild episode of major depressive disorder    Severe obesity (BMI 35.0-35.9 with comorbidity) (HCC)    Pure hypercholesterolemia    Type 2 diabetes mellitus without complication, without long-term current  use of insulin  (HCC)    Benign prostatic hyperplasia with weak urinary stream    Primary hypertension    Abnormal echocardiogram    Obesity (BMI 30-39.9)    Mild mitral regurgitation by prior echocardiogram    Chronic venous insufficiency    Memory loss    Anemia    Onychomycosis    OSA (obstructive sleep apnea)     Current Outpatient Medications   Medication Sig    ipratropium (ATROVENT ) 0.06 % nasal spray 2 sprays by Each Nostril route 3 times daily as needed for Rhinitis    traZODone  (DESYREL ) 50 MG tablet Take 1 tablet by mouth nightly    azelastine  (ASTELIN ) 0.1 % nasal spray 2 sprays by Nasal route 2 times daily Use in each nostril as directed    omeprazole  (PRILOSEC) 40 MG delayed release capsule TAKE ONE CAPSULE BY MOUTH EVERY MORNING BEFORE BREAKFAST    Insulin  Pen Needle 32G X 4 MM MISC 1 each by Does not apply route daily    atorvastatin  (LIPITOR) 80 MG tablet Take 1 tablet by mouth daily    Semaglutide ,0.25 or 0.5MG /DOS, 2 MG/3ML SOPN 0.25 mg every week for 4 weeks then 0.5 mg weekly    Insulin  Glargine, 2 Unit Dial, (TOUJEO  MAX SOLOSTAR) 300 UNIT/ML concentrated injection pen  50 units daily.    tadalafil  (CIALIS ) 5 MG tablet Take 1 tablet by mouth every morning    finasteride  (PROSCAR ) 5 MG tablet Take 1 tablet by mouth daily    gabapentin  (NEURONTIN ) 300 MG capsule 2 capsules at bedtime    losartan  (COZAAR ) 25 MG tablet Take 1 tablet by mouth daily    metFORMIN  (GLUCOPHAGE ) 1000 MG tablet Take 1 tablet by mouth 2 times daily (with meals)    buPROPion  (WELLBUTRIN  SR) 150 MG extended release tablet Take 1 tablet by mouth 2 times daily    silodosin  (RAPAFLO ) 8 MG CAPS Take 1 capsule by mouth daily    fenofibric acid  (TRILIPIX ) 135 MG CPDR capsule Take 1 capsule by mouth daily    blood glucose test strips (ASCENSIA AUTODISC VI;ONE TOUCH ULTRA TEST VI) strip 1 each daily As needed.    Lancets Ultra Fine MISC by Does not apply route    sodium chloride  (OCEAN) 0.65 % nasal spray 1 spray by Nasal route as  needed for Congestion OTC as directed    Cyanocobalamin (VITAMIN B 12 PO) Take 2,000 mcg by mouth OTC as directed    aspirin 81 MG EC tablet Take 1 tablet by mouth daily    Omega-3 Fatty Acids (OMEGA 3 PO) Take 500 mcg by mouth OTC as directed     No current facility-administered medications for this visit.     Past Medical History:   Diagnosis Date    Anxiety     Colon polyp 2017    Depression     Erectile dysfunction     Hypertension 2009    Kidney stone 2021 or 2022    Neuropathy     Obesity     Restless legs syndrome     Sleep apnea 2019?    Type 2 diabetes mellitus without complication      Social History     Tobacco Use    Smoking status: Never     Passive exposure: Yes    Smokeless tobacco: Never    Tobacco comments:     Sporadic tobacco use (1-4x/yr) between 8020-8001?   Substance Use Topics    Alcohol use: Yes     Comment: Only about 1-3 drinks (various types) per month on average     Past Surgical History:   Procedure Laterality Date    APPENDECTOMY  1974?    COLONOSCOPY N/A 02/24/2023    COLONOSCOPY POLYPECTOMY SNARE/COLD BIOPSY performed by Verne Heidi PENNER, MD at [facility]    CYSTOSCOPY  09/29/2021    EYE SURGERY  2000?    Right eye cornea transplant    KIDNEY STONE SURGERY  2021 or 2022    SINUS SURGERY      TONSILLECTOMY  1960?     Family History   Problem Relation Age of Onset    Depression Mother     Diabetes Father         Type II    Heart Disease Father     High Blood Pressure Father     High Cholesterol Father     Obesity Father     Hypertension Father     Stroke Father     Alcohol Abuse Sister     Depression Sister     Obesity Sister     Hearing Loss Sister         ROS:    Review of Systems   Constitutional: Negative.    HENT: Negative.     Eyes:  Negative.    Respiratory: Negative.     Cardiovascular: Negative.    Gastrointestinal: Negative.    Endocrine: Negative.    Genitourinary: Negative.    Musculoskeletal: Negative.    Skin: Negative.    Allergic/Immunologic: Negative.    Neurological:   Positive for dizziness.   Hematological: Negative.    Psychiatric/Behavioral: Negative.            PHYSICAL EXAM:    BP 136/80 (BP Site: Left Upper Arm, Patient Position: Sitting)   Ht 1.778 m (5' 10)   Wt 112.3 kg (247 lb 9.6 oz)   BMI 35.53 kg/m     General: NAD, well-appearing  Neuro: No gross neuro deficits. CN's II-XII intact. No facial weakness.  Eyes: EOMI. Pupils reactive. No periorbital edema/ecchymosis.   Skin: No facial erythema, rashes or concerning lesions.  Nose: No external deviations or saddling. Intranasally, septum is midline without perforations, nasal mucosa appears healthy with no erythema, mucopurulence, or polyps.  Mouth: Moist mucus membranes, normal tongue/palate mobility, no concerning mucosal lesions.   Oropharynx: clear with no erythema/exudate, no tonsillar hypertrophy.  Ears: Normal appearing auricles, no hematomas. EACs clear with no cerumen impaction, healthy canal skin, TM's intact with no perforations or retraction pockets. No middle ear effusions.  Dix-Hallpike negative bilaterally  Neck: Soft, supple, no palpable lateral neck masses. No parotid or submandibular masses. No thyromegaly or palpable thyroid nodules. No surgical scars.  Lymphatics: No palpable cervical LAD.  Resp/Lungs: No audible stridor or wheezing, CTAB  Heart: RRR  Extremities: No clubbing or cyanosis.    PROCEDURE: Nasal endoscopy  INDICATIONS: crs  DESCRIPTION: Secondary to inadequate exposure with anterior rhinoscopy and nasal endoscopy was performed.  After verbal consent was obtained and a timeout was performed, the 0 degree rigid nasal endoscope was advanced into both nares. The septum was midline w/ no perforations. There were no masses seen along the nasal floor or within the nasopharynx. On the R side, the mucosa was healthy and the turbinates were intact. The middle meatus was clear w/ no edema, polyps or mucopurulence. On the L side, there was mucopurulence in the maxillary sinus that was cultured.   The sphenoethmoidal recess was examined bilaterally and was free of pus or polyposis.  The scope was then removed and the patient tolerated the procedure well w/ no complications.       CT MAXILLOFACIAL WO CONTRAST  Order: [id number]  Narrative    CLINICAL DATA:  Chronic sinusitis    EXAM:  CT MAXILLOFACIAL WITHOUT CONTRAST    TECHNIQUE:  Multidetector CT images of the paranasal sinuses were obtained using  the standard protocol without intravenous contrast.    RADIATION DOSE REDUCTION: This exam was performed according to the  departmental dose-optimization program which includes automated  exposure control, adjustment of the mA and/or kV according to  patient size and/or use of iterative reconstruction technique.    COMPARISON:  Brain MRI 03/01/2020    FINDINGS:  Paranasal sinuses:    Frontal: Mild mucosal thickening at the inferior frontal sinuses.  Patent frontal ethmoidal recesses    Ethmoid: Mild generalized mucosal thickening in the paranasal  sinuses    Maxillary: Extensive opacification with mucosal thickening and  fluid/secretions bilaterally. There could be an odontogenic  component given unerupted left upper wisdom tooth with periapical  lucency that appears dehiscent anteriorly. On the right there is a  devitalized premolar with periapical lucency that is also dehiscent  into the maxillary sinus.    Sphenoid: Normally  aerated. Patent sphenoethmoidal recesses.    Right ostiomeatal unit: Opacified infundibulum by soft tissue  density. Infraorbital ridge which is nonobstructive.    Left ostiomeatal unit: Opacified infundibulum by mucosal disease.  Secondary ostium which appears patent.    Nasal passages: Patent. Intact nasal septum is midline.    Anatomy: No pneumatization superior to anterior ethmoid notches.  Sellar sphenoid pneumatization pattern. No onodi cell.    Other: No incidental finding in the soft tissues, orbits, or brain  on soft tissue windows.    IMPRESSION:  Active sinusitis most extensive  in the bilateral maxillary sinus  where mucosal disease effaces both maxillary infundibula. There  could be odontogenic contribution at the bilateral maxillary  sinuses, as above.      Results  MR BRAIN WO CONTRAST (Order [id number])      suggestion  Information displayed in this report may not trend or trigger automated decision support.     MR BRAIN WO CONTRAST  Order: [id number]  Narrative    CLINICAL DATA:  Mild cognitive impairment. Memory and attention  changes over the last few years.    EXAM:  MRI HEAD WITHOUT CONTRAST    TECHNIQUE:  Multiplanar, multiecho pulse sequences of the brain and surrounding  structures were obtained without intravenous contrast.    COMPARISON:  03/01/2020    FINDINGS:  Brain: Diffusion imaging does not show any acute or subacute  infarction. No focal abnormality affects the brainstem or  cerebellum. Cerebral hemispheres show age related volume loss of a  mild degree, not grossly changed since the study of 2021. The brain  does not show small-vessel disease or any old large vessel insult.  No mass lesion, hemorrhage, hydrocephalus or extra-axial collection.    Vascular: Major vessels at the base of the brain show flow.    Skull and upper cervical spine: Negative    Sinuses/Orbits: Pronounced inflammatory changes of the paranasal  sinuses affecting the maxillary and ethmoid regions.    Other: None    IMPRESSION:  No change in appearance of the brain since 2021. Mild generalized  volume loss. No evidence of old or acute small or large vessel  infarction or of hydrocephalus.    Pronounced mucosal inflammatory changes of the maxillary and ethmoid  sinuses.  Lab Results   Component Value Date    WBC 6.9 01/11/2024    HGB 14.0 01/11/2024    HCT 42.2 01/11/2024    MCV 85.8 01/11/2024    PLT 293 01/11/2024     Lab Results   Component Value Date/Time    NA 139 04/18/2024 10:01 AM    K 4.0 04/18/2024 10:01 AM    CL 102 04/18/2024 10:01 AM    CO2 26 04/18/2024 10:01 AM    BUN 16 04/18/2024  10:01 AM    CREATININE 1.01 04/18/2024 10:01 AM    GLUCOSE 115 04/18/2024 10:01 AM    CALCIUM  9.7 04/18/2024 10:01 AM    LABGLOM 80 04/18/2024 10:01 AM    LABGLOM 81 03/15/2023 01:25 PM        ASSESSMENT and PLAN  Dizziness and disequilibrium likely orthostatic.  I will have him see physical therapy.  I will get him set up with an audiogram.  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  He can consider posterior nasal nerve ablation in the future as well.  He also has left maxillary sinusitis.  Culture obtained and I will send in antibiotics based off culture result.  I will see him  back in 3 months.    ICD-10-CM    1. Nasal obstruction  J34.89 NASAL ENDOSCOPY,DX      2. Chronic pansinusitis  J32.4 Culture Sinus      3. OSA (obstructive sleep apnea)  G47.33       4. Hypertrophy of both inferior nasal turbinates  J34.3       5. Nasal septal deviation  J34.2       6. Morbid (severe) obesity due to excess calories (E66.01)  E66.01       7. Body mass index [BMI] 35.0-35.9, adult (Z68.35)  Z68.35       8. Tinnitus of both ears  H93.13       9. Dizziness  R42 [facility] - Physical Therapy, [facility]      10. Disequilibrium  R42 [facility], [facility]              Prentice Gunnels, MD  09/28/2024  Electronically signed    Note dictated using voice recognition software.  Please excuse any typos.   "

## 2024-09-30 LAB — CULTURE SINUS: Culture: NORMAL

## 2024-10-03 ENCOUNTER — Telehealth: Admit: 2024-10-03 | Discharge: 2024-10-03 | Payer: MEDICARE | Attending: Family | Primary: Family

## 2024-10-03 DIAGNOSIS — Z0000 Encounter for general adult medical examination without abnormal findings: Principal | ICD-10-CM

## 2024-10-03 NOTE — Progress Notes (Signed)
 "Medicare Annual Wellness Visit    Lee Page is here for Medicare AWV    Assessment & Plan   Medicare annual wellness visit, subsequent  Primary hypertension  OSA (obstructive sleep apnea)  Type 2 diabetes mellitus without complication, without long-term current use of insulin  (HCC)  -     Hemoglobin A1C; Future  -     Albumin/Creatinine Ratio, Urine; Future  Pure hypercholesterolemia  -     Lipid Panel; Future  -     Comprehensive Metabolic Panel; Future  -     TSH; Future  Obesity (BMI 30-39.9)  Severe obesity (BMI 35.0-35.9 with comorbidity) (HCC)  Benign prostatic hyperplasia with weak urinary stream  Encounter for screening for malignant neoplasm of prostate  -     PSA Screening; Future   Discussed BMI and healthy weight and diet, weight bearing exercise, smoking avoidance, sun protection and medication compliance. Reviewed appropriate health maintenance screening. The patient was counseled regarding screening procedures and recommended schedules for regular prostate exam, PSA, self testicular exam, GI hemoccult testing, colonoscopy and recommended vaccinations.   Due for Covid vaccine. Colonoscopy current. PSA with upcoming labs. Needs ACP      Return for follow up scheduled.     Subjective       Patient's complete Health Risk Assessment and screening values have been reviewed and are found in Flowsheets. The following problems were reviewed today and where indicated follow up appointments were made and/or referrals ordered.    Positive Risk Factor Screenings with Interventions:    Fall Risk:  Do you feel unsteady or are you worried about falling? : (!) yes  2 or more falls in past year?: no  Fall with injury in past year?: noInterventions:    See AVS for additional education material     Depression:  PHQ-2 Score: 2  PHQ-9 Total Score: 12  Total Score Interpretation: 10-14 = moderate depression  Interventions:  LPN INTERVENTION GUIDE: SCORE 5-14 = MODERATE DEPRESSION: FOLLOW UP IN 1 WEEK           General HRA Questions:  Select all that apply: (!) Stress, AngerInterventions - Stress:  Patient advised to follow up in the office for further evaluation and treatment  Interventions - Anger:  Patient advised to follow up in the office for further evaluation and treatment      Inactivity:  On average, how many days per week do you engage in moderate to strenuous exercise (like a brisk walk)?: 1 day (!) Abnormal  On average, how many minutes do you engage in exercise at this level?: 10 min  Interventions:  See AVS for additional education material     Abnormal BMI (obese):  There is no height or weight on file to calculate BMI. (!) Abnormal  Interventions:  Will get current weight and height at next office visit, to calculate current BMI.         Hearing Screen:  Do you or your family notice any trouble with your hearing that hasn't been managed with hearing aids?: (!) Yes  Interventions:  Patient has a hearing test scheduled for 10/30/24.    Vision Screen:  Do you have difficulty driving, watching TV, or doing any of your daily activities because of your eyesight?: (!) Yes  Have you had an eye exam within the past year?: (!) No  Interventions:   Patient encouraged to make appointment with their eye specialist      ADL's:   Patient reports needing help  with:  Select all that apply: (!) Walking/Balance  Interventions:  See AVS for additional education material    Advanced Directives:  Do you have a Living Will?: (!) No  Intervention:  has NO advanced directive - information provided                     Objective    Patient-Reported Vitals  No data recorded             Allergies   Allergen Reactions    Antihistamines, Diphenhydramine-Type Other (See Comments)     Causes depression    Penicillins Hives    Statins Other (See Comments)    Sulfa Antibiotics Hives    Codeine Nausea And Vomiting     Prior to Visit Medications   Medication Sig Taking? Authorizing Provider   ipratropium (ATROVENT ) 0.06 % nasal spray 2 sprays  by Each Nostril route 3 times daily as needed for Rhinitis Yes Heffernan, Prentice DASEN, MD   traZODone  (DESYREL ) 50 MG tablet Take 1 tablet by mouth nightly Yes Lear Nest L, PA   azelastine  (ASTELIN ) 0.1 % nasal spray 2 sprays by Nasal route 2 times daily Use in each nostril as directed Yes Lear Nest CROME, PA   omeprazole  (PRILOSEC) 40 MG delayed release capsule TAKE ONE CAPSULE BY MOUTH EVERY MORNING BEFORE BREAKFAST Yes Lovell-Sherman, Maeley Matton H, APRN - CNP   Insulin  Pen Needle 32G X 4 MM MISC 1 each by Does not apply route daily Yes Lovell-Sherman, Merci Walthers H, APRN - CNP   atorvastatin  (LIPITOR) 80 MG tablet Take 1 tablet by mouth daily Yes Lovell-Sherman, Ai Sonnenfeld H, APRN - CNP   Semaglutide ,0.25 or 0.5MG /DOS, 2 MG/3ML SOPN 0.25 mg every week for 4 weeks then 0.5 mg weekly Yes Lovell-Sherman, Shaliyah [name] H, APRN - CNP   Insulin  Glargine, 2 Unit Dial, (TOUJEO  MAX SOLOSTAR) 300 UNIT/ML concentrated injection pen 50 units daily. Yes Lovell-Sherman, [name] Mullarkey H, APRN - CNP   tadalafil  (CIALIS ) 5 MG tablet Take 1 tablet by mouth every morning Yes Lovell-Sherman, Zakeya Junker H, APRN - CNP   finasteride  (PROSCAR ) 5 MG tablet Take 1 tablet by mouth daily Yes Lovell-Sherman, Akisha Sturgill H, APRN - CNP   gabapentin  (NEURONTIN ) 300 MG capsule 2 capsules at bedtime Yes Lovell-Sherman, Dosha Broshears H, APRN - CNP   losartan  (COZAAR ) 25 MG tablet Take 1 tablet by mouth daily Yes Lovell-Sherman, Camry Theiss H, APRN - CNP   metFORMIN  (GLUCOPHAGE ) 1000 MG tablet Take 1 tablet by mouth 2 times daily (with meals) Yes Lovell-Sherman, Arlena Marsan H, APRN - CNP   buPROPion  (WELLBUTRIN  SR) 150 MG extended release tablet Take 1 tablet by mouth 2 times daily Yes Lovell-Sherman, Jacon Whetzel H, APRN - CNP   silodosin  (RAPAFLO ) 8 MG CAPS Take 1 capsule by mouth daily Yes Ricke Rosina NOVAK, APRN - CNP   fenofibric acid  (TRILIPIX ) 135 MG CPDR capsule Take 1 capsule by mouth daily Yes Lovell-Sherman, Jorryn Hershberger H, APRN - CNP   blood glucose test strips (ASCENSIA AUTODISC  VI;ONE TOUCH ULTRA TEST VI) strip 1 each daily As needed. Yes [provider]   Lancets Ultra Fine MISC by Does not apply route Yes [provider]   sodium chloride  (OCEAN) 0.65 % nasal spray 1 spray by Nasal route as needed for Congestion OTC as directed Yes [provider]   Cyanocobalamin (VITAMIN B 12 PO) Take 2,000 mcg by mouth OTC as directed Yes [provider]   aspirin 81 MG EC tablet Take 1 tablet by mouth  daily Yes [provider]   Omega-3 Fatty Acids (OMEGA 3 PO) Take 500 mcg by mouth OTC as directed Yes [provider]       CareTeam (Including outside providers/suppliers regularly involved in providing care):   Patient Care Team:  Lovell-Sherman, Mikahla Wisor H, APRN - CNP as PCP - General (Nurse Practitioner, Family)  Lovell-Sherman, Jairy Angulo H, APRN - CNP as PCP - Empaneled Provider     Recommendations for Preventive Services Due: see orders and patient instructions/AVS.  Recommended screening schedule for the next 5-10 years is provided to the patient in written form: see Patient Instructions/AVS.     Reviewed and updated this visit:  Tobacco  Allergies  Meds  Problems  Med Hx  Surg Hx  Fam Hx            DRAPER GALLON, was evaluated through a synchronous (real-time) audio-video encounter. The patient (or guardian if applicable) is aware that this is a billable service, which includes applicable co-pays. This Virtual Visit was conducted with patient's (and/or legal guardian's) consent. Patient identification was verified, and a caregiver was present when appropriate.   The patient was located at Home: [address]  [place] [address]  Provider was located at [employer] (Appt Dept): [address]  [place],  [place] [address]  Confirm you are appropriately licensed, registered, or certified to deliver care in the state where the patient is located as indicated above. If you are not or unsure, please re-schedule the  visit: Yes, I confirm.      "

## 2024-10-03 NOTE — Patient Instructions (Signed)
 "     Preventing Falls: Care Instructions  Injuries and health problems such as trouble walking or poor eyesight can increase your risk of falling. So can some medicines. But there are things you can do to help prevent falls. You can exercise to get stronger. You can also arrange your home to make it safer.    Talk to your doctor about the medicines you take. Ask if any of them increase the risk of falls and whether they can be changed or stopped.   Try to exercise regularly. It can help improve your strength and balance. This can help lower your risk of falling.         Practice fall safety and prevention.   Wear low-heeled shoes that fit well and give your feet good support. Talk to your doctor if you have foot problems that make this hard.  Carry a cellphone or wear a medical alert device that you can use to call for help.  Use stepladders instead of chairs to reach high objects. Don't climb if you're at risk for falls. Ask for help, if needed.  Wear the correct eyeglasses, if you need them.        Make your home safer.   Remove rugs, cords, clutter, and furniture from walkways.  Keep your house well lit. Use night-lights in hallways and bathrooms.  Install and use sturdy handrails on stairways.  Wear nonskid footwear, even inside. Don't walk barefoot or in socks without shoes.        Be safe outside.   Use handrails, curb cuts, and ramps whenever possible.  Keep your hands free by using a shoulder bag or backpack.  Try to walk in well-lit areas. Watch out for uneven ground, changes in pavement, and debris.  Be careful in the winter. Walk on the grass or gravel when sidewalks are slippery. Use de-icer on steps and walkways. Add non-slip devices to shoes.    Put grab bars and nonskid mats in your shower or tub and near the toilet. Try to use a shower chair or bath bench when bathing.   Get into a tub or shower by putting in your weaker leg first. Get out with your strong side first. Have a phone or medical alert  device in the bathroom with you.   Where can you learn more?  Go to [employer] and enter G117 to learn more about Preventing Falls: Care Instructions.  Current as of: June 30, 2023  Content Version: 14.6   2024-2025 [place], [place].   Care instructions adapted under license by [facility]. If you have questions about a medical condition or this instruction, always ask your healthcare professional. Romayne Alderman, [facility], disclaims any warranty or liability for your use of this information.         Learning About Stress  What is stress?     Stress is your body's response to a hard situation. Your body can have a physical, emotional, or mental response. Stress is a fact of life for most people, and it affects everyone differently. What causes stress for you may not be stressful for someone else.  A lot of things can cause stress. You may feel stress when you go on a job interview, take a test, or run a race. This kind of short-term stress is normal and even useful. It can help you if you need to work hard or react quickly. For example, stress can help you finish an important job on time.  Long-term stress is caused by ongoing stressful situations or events. Examples of long-term stress include long-term health problems, ongoing problems at work, or conflicts in your family. Long-term stress can harm your health.  How does stress affect your health?  When you are stressed, your body responds as though you are in danger. It makes hormones that speed up your heart, make you breathe faster, and give you a burst of energy. This is called the fight-or-flight stress response. If the stress is over quickly, your body goes back to normal and no harm is done.  But if stress happens too often or lasts too long, it can have bad effects. Long-term stress can make you more likely to get sick, and it can make symptoms of some diseases worse. If you tense up when you are stressed, you may develop  neck, shoulder, or low back pain. Stress is linked to high blood pressure and heart disease.  Stress also harms your emotional health. It can make you moody, tense, or depressed. Your relationships may suffer, and you may not do well at work or school.  What can you do to manage stress?  You can try these things to help manage stress:   Do something active. Exercise or activity can help reduce stress. Walking is a great way to get started. Even everyday activities such as housecleaning or yard work can help.  Try yoga or tai chi. These techniques combine exercise and meditation. You may need some training at first to learn them.  Do something you enjoy. For example, listen to music or go to a movie. Practice your hobby or do volunteer work.  Meditate. This can help you relax, because you are not worrying about what happened before or what may happen in the future.  Do guided imagery. Imagine yourself in any setting that helps you feel calm. You can use online videos, books, or a teacher to guide you.  Do breathing exercises. For example:  From a standing position, bend forward from the waist with your knees slightly bent. Let your arms dangle close to the floor.  Breathe in slowly and deeply as you return to a standing position. Roll up slowly and lift your head last.  Hold your breath for just a few seconds in the standing position.  Breathe out slowly and bend forward from the waist.  Let your feelings out. Talk, laugh, cry, and express anger when you need to. Talking with supportive friends or family, a veterinary surgeon, or a faith leader about your feelings is a healthy way to relieve stress. Avoid discussing your feelings with people who make you feel worse.  Write. It may help to write about things that are bothering you. This helps you find out how much stress you feel and what is causing it. When you know this, you can find better ways to cope.  What can you do to prevent stress?  You might try some of these things  to help prevent stress:  Manage your time. This helps you find time to do the things you want and need to do.  Get enough sleep. Your body recovers from the stresses of the day while you are sleeping.  Get support. Your family, friends, and community can make a difference in how you experience stress.  Limit your news feed. Avoid or limit time on social media or news that may make you feel stressed.  Do something active. Exercise or activity can help reduce stress. Walking is a great  way to get started.  Where can you learn more?  Go to [employer] and enter N032 to learn more about Learning About Stress.  Current as of: September 23, 2023  Content Version: 14.6   2024-2025 [place], [place].   Care instructions adapted under license by [facility]. If you have questions about a medical condition or this instruction, always ask your healthcare professional. Romayne Alderman, [facility], disclaims any warranty or liability for your use of this information.         Learning About Managing Anger  What causes anger?  Many things can cause anger. Stress at home or at work can cause anger. Being in stressful social situations can also cause it.  Anger signals your body to prepare for a fight. This reaction is often called fight or flight. When you get angry, adrenaline and other hormones are released into your blood. Then your blood pressure goes up, your heart beats faster, and you breathe faster.  When you express anger in a healthy way, it can inspire change and make you productive. But if you don't have the skills to express anger in a healthy way, anger can build up. You may hurt others--and yourself--emotionally and even physically. Violent behavior often starts with verbal threats or fairly minor incidents. But over time, it can involve physical harm. It can include physical, verbal, or sexual abuse of an intimate partner (domestic violence), a child (child abuse), or an older adult  (elder abuse).  It can also make you sick. Anger and constant hostility keep your blood pressure high. They raise your chances of having other health problems, such as depression, a heart attack, or a stroke. Some people with post-traumatic stress disorder (PTSD) feel angry and on alert all the time.  It may feel like there are no other ways to react when you are angry. But when you learn healthy ways to work with anger, it no longer controls you.  How can you manage your anger?  The first step to managing anger is to be more aware of it. Note the thoughts, feelings, and emotions that you have when you get angry. Practice noticing these signs of anger when you are calm. If you are more aware of the signs of anger, you can take steps to manage it. Here are a few tips:  Think before you act. Take time to stop and cool down when you feel yourself getting angry. Count to 10 while you take slow, steady breaths. Practice some other form of mental relaxation.  Learn the feelings that lead to angry outbursts. Anger and hostility may be a symptom of unhappy feelings or depression about your job, your relationship, or other aspects of your personal life.  Try to avoid situations that lead to angry outbursts. If standing in line bothers you, do errands at less busy times.  Express anger in a healthy way. You might:  Go for a short walk or jog.  Draw, paint, or listen to music to help release the anger.  Write in a journal.  Use I statements, not you statements, to discuss your anger. Say I don't feel valued when my needs are not being met instead of You make me mad when you are so inconsiderate.  Take care of yourself.  Exercise regularly.  Eat a variety of healthy foods. Don't skip meals.  Try to get 8 hours of sleep each night.  Limit your use of alcohol, and avoid using drugs.  Practice yoga, meditation, or tai  chi to relax.  Explore other resources that may be available through your job or your community.  Contact  your human resources department at work. You might be able to get services through an employee assistance program.  Contact your local hospital, mental health facility, or health department. Ask what types of programs or support groups are available in your area.  Do not keep guns in your home. If you must have guns in your home, unload them and lock them up. Lock ammunition in a separate place. Keep guns away from children.  Where can you find help?  If anger or stress starts to harm your work or personal relationships, you may want to seek help. You can learn ways to manage your feelings and actions.  Talk to someone you trust, or find a veterinary surgeon.  Learn about groups in your area that can connect you with people to talk to. The following resources can help.  Behavioral Health Treatment Services Locator. This service from the national Substance Abuse and Mental Health Services Administration can help you find local counselors. Search online at [employer].rocktoxic.pl or call 1-800-662-HELP (4357), or TDD 6840722184.  National Alliance on Mental Illness (NAMI). You can call the NAMI HelpLine (763 548 5240) or go online (commonfit.co.nz) to find resources and chat with a trained volunteer.  Where can you learn more?  Go to [employer] and enter Z357 to learn more about Learning About Managing Anger.  Current as of: June 30, 2023  Content Version: 14.6   2024-2025 [place], [place].   Care instructions adapted under license by [facility]. If you have questions about a medical condition or this instruction, always ask your healthcare professional. Romayne Alderman, [facility], disclaims any warranty or liability for your use of this information.         Learning About Being Active as an Older Adult  Why is being active important as you get older?     Being active is one of the best things you can do for your health. And it's never too late to start. Being active--or getting  active, if you aren't already--has definite benefits. It can:  Give you more energy,  Keep your mind sharp.  Improve balance to reduce your risk of falls.  Help you manage chronic illness with fewer medicines.  No matter how old you are, how fit you are, or what health problems you have, there is a form of activity that will work for you. And the more physical activity you can do, the better your overall health will be.  What kinds of activity can help you stay healthy?  Being more active will make your daily activities easier. Physical activity includes planned exercise and things you do in daily life. There are four types of activity:  Aerobic.  Doing aerobic activity makes your heart and lungs strong.  Includes walking, dancing, and gardening.  Aim for at least 2 hours spread throughout the week.  It improves your energy and can help you sleep better.  Muscle-strengthening.  This type of activity can help maintain muscle and strengthen bones.  Includes climbing stairs, using resistance bands, and lifting or carrying heavy loads.  Aim for at least twice a week.  It can help protect the knees and other joints.  Stretching.  Stretching gives you better range of motion in joints and muscles.  Includes upper arm stretches, calf stretches, and gentle yoga.  Aim for at least twice a week, preferably after your muscles are warmed up from other  activities.  It can help you function better in daily life.  Balancing.  This helps you stay coordinated and have good posture.  Includes heel-to-toe walking, tai chi, and certain types of yoga.  Aim for at least 3 days a week.  It can reduce your risk of falling.  Even if you have a hard time meeting the recommendations, it's better to be more active than less active. All activity done in each category counts toward your weekly total. You'd be surprised how daily things like carrying groceries, keeping up with grandchildren, and taking the stairs can add up.  What keeps you from  being active?  If you've had a hard time being more active, you're not alone. Maybe you remember being able to do more. Or maybe you've never thought of yourself as being active. It's frustrating when you can't do the things you want. Being more active can help. What's holding you back?  Getting started.  Have a goal, but break it into easy tasks. Small steps build into big accomplishments.  Staying motivated.  If you feel like skipping your activity, remember your goal. Maybe you want to move better and stay independent. Every activity gets you one step closer.  Not feeling your best.  Start with 5 minutes of an activity you enjoy. Prove to yourself you can do it. As you get comfortable, increase your time.  You may not be where you want to be. But you're in the process of getting there. Everyone starts somewhere.  How can you find safe ways to stay active?  Talk with your doctor about any physical challenges you're facing. Make a plan with your doctor if you have a health problem or aren't sure how to get started with activity.  If you're already active, ask your doctor if there is anything you should change to stay safe as your body and health change.  If you tend to feel dizzy after you take medicine, avoid activity at that time. Try being active before you take your medicine. This will reduce your risk of falls.  If you plan to be active at home, make sure to clear your space before you get started. Remove things like TV cords, coffee tables, and throw rugs. It's safest to have plenty of space to move freely.  The key to getting more active is to take it slow and steady. Try to improve only a little bit at a time. Pick just one area to improve on at first. And if an activity hurts, stop and talk to your doctor.  Where can you learn more?  Go to [employer] and enter P600 to learn more about Learning About Being Active as an Older Adult.  Current as of: June 30, 2023  Content Version:  14.6   2024-2025 [place], [place].   Care instructions adapted under license by [facility]. If you have questions about a medical condition or this instruction, always ask your healthcare professional. Romayne Alderman, [facility], disclaims any warranty or liability for your use of this information.         Hearing Loss: Care Instructions  Overview     Hearing loss is a sudden or slow decrease in how well you hear. It can range from slight to profound. Permanent hearing loss can occur with aging. It also can happen when you are exposed long-term to loud noise. Examples include listening to loud music, riding motorcycles, or being around other loud machines.  Hearing loss can affect your work  and home life. It can make you feel lonely or depressed. You may feel that you have lost your independence. But hearing aids and other devices can help you hear better and feel connected to others.  Follow-up care is a key part of your treatment and safety. Be sure to make and go to all appointments, and call your doctor if you are having problems. It's also a good idea to know your test results and keep a list of the medicines you take.  How can you care for yourself at home?  Avoid loud noises whenever possible. This helps keep your hearing from getting worse.  Always wear hearing protection around loud noises.  Wear a hearing aid as directed.  A professional can help you pick a hearing aid that will work best for you.  You can also get hearing aids over the counter for mild to moderate hearing loss.  Have hearing tests as your doctor suggests. They can show whether your hearing has changed. Your hearing aid may need to be adjusted.  Use other devices as needed. These may include:  Telephone amplifiers and hearing aids that can connect to a television, stereo, radio, or microphone.  Devices that use lights or vibrations. These alert you to the doorbell, a ringing telephone, or a baby monitor.  Television closed-captioning.  This shows the words at the bottom of the screen. Most new TVs can do this.  TTY (text telephone). This lets you type messages back and forth on the telephone instead of talking or listening. These devices are also called TDD. When messages are typed on the keyboard, they are sent over the phone line to a receiving TTY. The message is shown on a monitor.  Use text messaging, social media, and email if it is hard for you to communicate by telephone.  Try to learn a listening technique called speechreading. It is not lipreading. You pay attention to people's gestures, expressions, posture, and tone of voice. These clues can help you understand what a person is saying. Face the person you are talking to, and have them face you. Make sure the lighting is good. You need to see the other person's face clearly.  Think about counseling if you need help to adjust to your hearing loss.  When should you call for help?  Watch closely for changes in your health, and be sure to contact your doctor if:    You think your hearing is getting worse.     You have new symptoms, such as dizziness or nausea.   Where can you learn more?  Go to [employer] and enter R798 to learn more about Hearing Loss: Care Instructions.  Current as of: September 26, 2023  Content Version: 14.6   2024-2025 [place], [place].   Care instructions adapted under license by [facility]. If you have questions about a medical condition or this instruction, always ask your healthcare professional. Romayne Alderman, [facility], disclaims any warranty or liability for your use of this information.         Learning About Vision Tests  What are vision tests?     The four most common vision tests are visual acuity tests, refraction, visual field tests, and color vision tests.  Visual acuity (sharpness) tests  These tests are used:  To see if you need glasses or contact lenses.  To monitor an eye problem.  To check an eye injury.  Visual  acuity tests are done as part of routine exams. You may  also have this test when you get your driver's license or apply for some types of jobs.  Visual field tests  These tests are used:  To check for vision loss in any area of your range of vision.  To screen for certain eye diseases.  To look for nerve damage after a stroke, head injury, or other problem that could reduce blood flow to the brain.  Refraction and color tests  A refraction test is done to find the right prescription for glasses and contact lenses.  A color vision test is done to check for color blindness.  Color vision is often tested as part of a routine exam. You may also have this test when you apply for a job where recognizing different colors is important, such as truck driving, optician, dispensing, or the [employer].  How are vision tests done?  Visual acuity test   You cover one eye at a time.  You read aloud from a wall chart across the room.  You read aloud from a small card that you hold in your hand.  Refraction   You look into a special device.  The device puts lenses of different strengths in front of each eye to see how strong your glasses or contact lenses need to be.  Visual field tests   Your doctor may have you look through special machines.  Or your doctor may simply have you stare straight ahead while they move a finger into and out of your field of vision.  Color vision test   You look at pieces of printed test patterns in various colors. You say what number or symbol you see.  Your doctor may have you trace the number or symbol using a pointer.  How do these tests feel?  There is very little chance of having a problem from this test. If dilating drops are used for a vision test, they may make the eyes sting and cause a medicine taste in the mouth.  Follow-up care is a key part of your treatment and safety. Be sure to make and go to all appointments, and call your doctor if you are having problems. It's also a good idea to know your test  results and keep a list of the medicines you take.  Where can you learn more?  Go to [employer] and enter G551 to learn more about Learning About Vision Tests.  Current as of: June 30, 2023  Content Version: 14.6   2024-2025 [place], [place].   Care instructions adapted under license by [facility]. If you have questions about a medical condition or this instruction, always ask your healthcare professional. Romayne Alderman, [facility], disclaims any warranty or liability for your use of this information.         Learning About Activities of Daily Living  What are activities of daily living?     Activities of daily living (ADLs) are the basic self-care tasks you do every day. These include eating, bathing, dressing, and moving around.  As you age, and if you have health problems, you may find that it's harder to do some of these tasks. If so, your doctor can suggest ideas that may help.  To measure what kind of help you may need, your doctor will ask how well you are able to do ADLs. Let your doctor know if there are any tasks that you are having trouble doing. This is an important first step to getting help. And when you have the help you need,  you can stay as independent as possible.  How will a doctor assess your ADLs?  Asking about ADLs is part of a routine health checkup your doctor will likely do as you age. Your health check might be done in a doctor's office, in your home, or at a hospital. The goal is to find out if you are having any problems that could make it hard to care for yourself or that make it unsafe for you to be on your own.  To measure your ADLs, your doctor will ask how hard it is for you to do routine tasks. Your doctor may also want to know if you have changed the way you do a task because of a health problem. Your doctor may watch how you:  Walk back and forth.  Keep your balance while you stand or walk.  Move from sitting to standing or from a bed to a  chair.  Button or unbutton a civil service fast streamer.  Remove and put on your shoes.  It's common to feel a little worried or anxious if you find you can't do all the things you used to be able to do. Talking with your doctor about ADLs is a way to make sure you're as safe as possible and able to care for yourself as well as you can. You may want to bring a caregiver, friend, or family member to your checkup. They can help you talk to your doctor.  Follow-up care is a key part of your treatment and safety. Be sure to make and go to all appointments, and call your doctor if you are having problems. It's also a good idea to know your test results and keep a list of the medicines you take.  Current as of: September 23, 2023  Content Version: 14.6   2024-2025 [place], [place].   Care instructions adapted under license by [facility]. If you have questions about a medical condition or this instruction, always ask your healthcare professional. Romayne Alderman, [facility], disclaims any warranty or liability for your use of this information.         Starting a Weight-Loss Plan: Care Instructions  Overview    It can be a challenge to lose weight. But your doctor can help you make a weight-loss plan that meets your needs.  You don't have to make a lot of big changes at once. A better idea might be to focus on small changes and stick with them. When those changes become habit, you can add a few more changes.  Some people find it helpful to take an exercise or nutrition class. If you have questions, ask your doctor about seeing a registered dietitian or an exercise specialist. You might also think about joining a weight-loss support group.  If you're not ready to make changes right now, try to pick a date in the future. Then make an appointment with your doctor to talk about when and how you'll get started with a plan.  Follow-up care is a key part of your treatment and safety. Be sure to make and go to all appointments, and call  your doctor if you are having problems. It's also a good idea to know your test results and keep a list of the medicines you take.  How can you care for yourself as you start a weight-loss plan?   Set realistic goals. Many people expect to lose much more weight than is likely. A weight loss of 5% to 10% of your  body weight may be enough to improve your health.  Get family and friends involved to provide support. Talk to them about why you are trying to lose weight, and ask them to help. They can help by participating in exercise and having meals with you, even if they may be eating something different.  Find what works best for you. If you do not have time or do not like to cook, a program that offers meal replacement bars or shakes may be better for you. Or if you like to prepare meals, finding a plan that includes daily menus and recipes may be best.  Ask your doctor about other health professionals who can help you achieve your weight-loss goals.  A dietitian can help you make healthy changes in your diet.  An exercise specialist or personal trainer can help you develop a safe and effective exercise program.  A counselor or psychiatrist can help you cope with issues such as depression, anxiety, or family problems that can make it hard to focus on weight loss.  Consider joining a support group for people who are trying to lose weight. Your doctor can suggest groups in your area.  Where can you learn more?  Go to [employer] and enter U357 to learn more about Starting a Weight-Loss Plan: Care Instructions.  Current as of: January 29, 2024  Content Version: 14.6   2024-2025 [place], [place].   Care instructions adapted under license by [facility]. If you have questions about a medical condition or this instruction, always ask your healthcare professional. Romayne Alderman, [facility], disclaims any warranty or liability for your use of this information.         Advance Directives: Care  Instructions  Overview  An advance directive is a legal way to state your wishes at the end of your life. It tells your loved ones and doctor what to do if you can't say what you want.  There are two main types of advance directives. You can change them any time your wishes change.  Living will. This form tells your loved ones and doctor your wishes about life support and other treatment. The form is also called a declaration.  Medical power of attorney. This form lets you name a person to make treatment decisions for you when you can't speak for yourself. This person is called a health care agent (health care proxy, health care surrogate). The form is also called a durable power of attorney for health care.  If you do not have an advance directive, decisions about your medical care may be made by a family member or doctor who doesn't know you or by a judge.  It may help to think of an advance directive as a gift to the people who care for you. If you have one, they won't have to make tough decisions by themselves.  For more information, including forms for your state, see the [employer] website (plumberbiz.com.cy).  Follow-up care is a key part of your treatment and safety. Be sure to make and go to all appointments, and call your doctor if you are having problems. It's also a good idea to know your test results and keep a list of the medicines you take.  What should you include in an advance directive?  Many states have a unique advance directive form. (It may ask you to address specific issues.) Or you might use a universal form that's approved by many states.  If your form doesn't tell you what to  address, it may be hard to know what to include in your advance directive. Use the questions below to help you get started.  Who do you want to make decisions about your medical care if you are not able to?  What life-support measures do you want if you have a serious illness that gets  worse over time or can't be cured?  What are you most afraid of that might happen? (Maybe you're afraid of having pain, losing your independence, or being kept alive by machines.)  Where would you prefer to die? (Your home? A hospital? A nursing home?)  Do you want to donate your organs when you die?  Do you want certain religious practices performed before you die?  When should you call for help?  Be sure to contact your doctor if you have any questions.  Where can you learn more?  Go to [employer] and enter R264 to learn more about Advance Directives: Care Instructions.  Current as of: May 30, 2024  Content Version: 14.6   2024-2025 [place], [place].   Care instructions adapted under license by [facility]. If you have questions about a medical condition or this instruction, always ask your healthcare professional. Romayne Alderman, [employer], disclaims any warranty or liability for your use of this information.         A Healthy Heart: Care Instructions  Overview    Coronary artery disease, also called heart disease, occurs when a substance called plaque builds up in the vessels that supply oxygen-rich blood to your heart muscle. This can narrow the blood vessels and reduce blood flow. A heart attack happens when blood flow is completely blocked. A high-fat diet, smoking, and other factors increase the risk of heart disease.  Your doctor has found that you have a chance of having heart disease. A heart-healthy lifestyle can help keep your heart healthy and prevent heart disease. This lifestyle includes eating healthy, being active, staying at a weight that's healthy for you, and not smoking, vaping, or using other tobacco or nicotine products. It also includes taking medicines as directed, managing other health conditions, and trying to get a healthy amount of sleep.  Follow-up care is a key part of your treatment and safety. Be sure to make and go to all appointments, and contact  your doctor if you are having problems. It's also a good idea to know your test results and keep a list of the medicines you take.  How can you care for yourself at home?  Diet  Use less salt when you cook and eat. This helps lower your blood pressure. Taste food before salting. Add only a little salt when you think you need it. With time, your taste buds will adjust to less salt.  Eat fewer snack items, fast foods, canned soups, and other high-salt, high-fat, processed foods.  Read food labels and try to avoid saturated and trans fats. They increase your risk of heart disease by raising cholesterol levels.  Limit the amount of solid fat--butter, margarine, and shortening--you eat. Use olive, peanut, or canola oil when you cook. Bake, broil, and steam foods instead of frying them.  Eat a variety of fruit and vegetables every day. Dark green, deep orange, red, or yellow fruits and vegetables are especially good for you. Examples include spinach, carrots, peaches, and berries.  Foods high in fiber can reduce your cholesterol and provide important vitamins and minerals. High-fiber foods include whole-grain cereals and breads, oatmeal, beans, brown rice, citrus fruits,  and apples.  Eat lean proteins. Heart-healthy proteins include seafood, lean meats and poultry, eggs, beans, peas, nuts, seeds, and soy products.  Limit drinks and foods with added sugar. These include candy, desserts, and soda pop.  Heart-healthy lifestyle  If your doctor recommends it, get more exercise. For many people, walking is a good choice. Or you may want to swim, bike, or do other activities. Bit by bit, increase the time you're active every day. Try for at least 30 minutes on most days of the week.  If you smoke, vape, or use other tobacco or nicotine products, try to quit. If you cant quit, cut back as much as you can. If you need help quitting, talk to your doctor about quit programs and medicines. Quitting is one of the most important  things you can do to protect your heart. Also avoid secondhand smoke and the aerosol mist from vaping.  Stay at a weight that's healthy for you. Talk to your doctor if you need help losing weight.  Try to get 7 to 9 hours of sleep each night.  Limit alcohol to 2 drinks a day for men and 1 drink a day for women. Too much alcohol can cause health problems.  Manage other health problems such as diabetes, high blood pressure, and high cholesterol. If you think you may have a problem with alcohol or drug use, talk to your doctor.  Medicines  Take your medicines exactly as prescribed. Contact your doctor if you think you are having a problem with your medicine.  When should you call for help?  Call 911 if you have symptoms of a heart attack. These may include:  Chest pain or pressure, or a strange feeling in the chest.  Sweating.  Shortness of breath.  Pain, pressure, or a strange feeling in the back, neck, jaw, or upper belly or in one or both shoulders or arms.  Lightheadedness or sudden weakness.  A fast or irregular heartbeat.  After you call 911, the operator may tell you to chew 1 adult-strength or 2 to 4 low-dose aspirin. Wait for an ambulance. Do not try to drive yourself.  Watch closely for changes in your health, and be sure to contact your doctor if you have any problems.  Where can you learn more?  Go to [employer] and enter F075 to learn more about A Healthy Heart: Care Instructions.  Current as of: June 30, 2023  Content Version: 14.6   2024-2025 [place], [place] .   Care instructions adapted under license by [facility]. If you have questions about a medical condition or this instruction, always ask your healthcare professional. Romayne Alderman, [facility], disclaims any warranty or liability for your use of this information.    Personalized Preventive Plan for Lee Page - 10/03/2024  Medicare offers a range of preventive health benefits. Some of the tests and  screenings are paid in full while other may be subject to a deductible, co-insurance, and/or copay.  Some of these benefits include a comprehensive review of your medical history including lifestyle, illnesses that may run in your family, and various assessments and screenings as appropriate.  After reviewing your medical record and screening and assessments performed today your provider may have ordered immunizations, labs, imaging, and/or referrals for you.  A list of these orders (if applicable) as well as your Preventive Care list are included within your After Visit Summary for your review.      "

## 2024-10-05 ENCOUNTER — Inpatient Hospital Stay: Admit: 2024-10-05 | Payer: MEDICARE | Primary: Family

## 2024-10-05 DIAGNOSIS — R42 Dizziness and giddiness: Secondary | ICD-10-CM

## 2024-10-05 NOTE — Progress Notes (Addendum)
 "Lee Page  DOB: Mar 20, 1951  Primary: Medicare Part A And B (Medicare)  Secondary: [place] SUPP [facility]  [address]  [address]  [place] [address]  Phone: [phone]  Fax: [phone] Plan Frequency: 2x8 weeks  Plan of Care/Certification Expiration Date: 01/03/25        Plan of Care/Certification Expiration Date:  Plan of Care/Certification Expiration Date: 01/03/25    Frequency/Duration: Plan Frequency: 2x8 weeks      Time In/Out:   Time In: 0945  Time Out: 1039  Minutes: 54      PT Visit Info:    Progress Note Due Date: 11/04/24  Total # of Visits to Date: 1      Visit Count:  1    OUTPATIENT PHYSICAL THERAPY:   Treatment Note 10/05/2024       Episode  (Dizziness)               Treatment Diagnosis:    Dizziness and giddiness  Difficulty in walking, not elsewhere classified  Muscle weakness (generalized)  Medical/Referring Diagnosis:    Dizziness [R42]  Disequilibrium [R42]      Referring Physician:  Geno Prentice DASEN, MD  MD Orders:  PT Eval and Treat   Return MD Appt:  TBD   Date of Onset:  Onset Date: -- (4-5 months ago)     Allergies:   Antihistamines, diphenhydramine-type; Penicillins; Statins; Sulfa antibiotics; and Codeine  Restrictions/Precautions:   Fall Precautions:        Interventions Planned (Treatment may consist of any combination of the following):     See Assessment Note    Subjective Comments:   See eval note from this date.  Initial Pain Level:     0/10  Post Session Pain Level:      0/10  Medications Last Reviewed: 10/05/2024  Updated Objective Findings:  See Evaluation Note from today  Treatment     Therapeutic Exercises:  (10 minutes)  -The following interventions were performed to improve ROM, strength, balance, endurance or aerobic capacity to improve exercise tolerance and facilitate return to functional ADLs.     Date:  10/05/24  Date:   Date:     Activity/Exercise Parameters Parameters Parameters   Education Tissue healing,  anatomy, mechanics, balance principles, sensory integration; strengthening principles, HEP     Nustep/bike      Mobility                Neuromuscular Re-education: (20 minutes)  -The following interventions were performed with appropriate cueing to improve balance, proprioception, and motor coordination to reduce fall risk and improve performance of static and dynamic motor tasks.       Date:  10/05/24  Date:   Date:     Activity/Exercise Parameters Parameters Parameters   Seated Balance -HT/HN x10 EO/EC  -forward bends 2x10 EO/EC each     Standing balance      Walking balance                             **All with extensive cuing (tac, vis, verbal, man); for recruitment, motor control, proprioception Date:  10/05/24  Date:   Date:     Activity/Exercise Parameters Parameters Parameters   Transfer therex X15 marches B  X15 heel raises seated B  X15 laqs B  Therapeutic Activity:  (0 minutes)  -The following interventions were performed to improve functional mobility demands, endurance, and motor coordination to optimize performance of daily functional tasks.    GAIT TRAINING: (8 minutes):    Gait training to improve and/or restore physical functioning as related to strength, balance, and coordination.  Ambulated 125 feet with supervision/set-up and moderate visual cueing, verbal cueing, and tactile cueing related to their stance phase and stride length to promote proper body posture and promote proper body mechanics..  Instruction in performance of SPC to correct stride length, push off, and hip position and motion.      Treatment/Session Summary:    Treatment Assessment:   See eval note from this date  Communication/Consultation:  Therapy Evaluation sent to referring provider  Equipment provided today:  HEP and Medbridge exercise access code: see below  Recommendations/Intent for next treatment session: Next visit will focus on overall BLE functional strengthening, seated balance progressing to  standing and stepping therex as tol.    >Total Treatment Billable Duration:  54 minutes (10 TE 20 neuro, 8 gait, 16 low eval) treated one-on-one with no other pts  Time In: 0945  Time Out: 1039     [name], PT       Access Code: [email or web address]  URL: [email or web address]  Date: 10/05/2024  Prepared by: [name]    Exercises  - Seated Cervical Rotation AROM  - 3 x daily - 7 x weekly - 3 sets - 10 reps  - Seated Cervical Extension AROM  - 3 x daily - 7 x weekly - 3 sets - 10 reps  - Seated Forward Bending  - 3 x daily - 7 x weekly - 3 sets - 10 reps  - Forward Bending Hip Flexion with Flat Lumbar Spine  - 3 x daily - 7 x weekly - 3 sets - 10 reps  - Supine Lower Trunk Rotation  - 3 x daily - 7 x weekly - 3 sets - 10 reps  - Clamshell  - 3 x daily - 7 x weekly - 3 sets - 10 reps  - Supine Bridge  - 3 x daily - 7 x weekly - 3 sets - 10 reps  - Sidelying Hip Abduction  - 3 x daily - 7 x weekly - 3 sets - 10 reps  - Supine Active Straight Leg Raise  - 3 x daily - 7 x weekly - 3 sets - 10 reps  - Standing Hip Abduction with Counter Support  - 3 x daily - 7 x weekly - 3 sets - 10 reps  - Standing Hip Extension with Counter Support  - 3 x daily - 7 x weekly - 3 sets - 10 reps  - Heel Raises with Counter Support  - 3 x daily - 7 x weekly - 3 sets - 10 reps    Charge Capture  Events  MedBridge Portal  Appt Desk  Attendance Report     Future Appointments   Date Time Provider Department Center   10/09/2024 11:15 AM Brocks Lamarr, PT [facility]   10/12/2024  2:45 PM Brocks Lamarr, PT [facility]   10/16/2024  1:15 PM Brocks Lamarr, PT [facility]   10/17/2024 11:00 AM Lovell-Sherman, Laneta DEL, APRN - CNP [facility]   10/18/2024  2:00 PM Brocks Lamarr, PT [facility]   10/23/2024  1:15 PM Brocks Lamarr, PT [facility]   10/25/2024 11:15 AM Brocks Lamarr, PT [facility]   10/30/2024  10:30 AM Tia Collar, PT [facility]   10/30/2024  3:00 PM Long, Evonne, AuD [facility]   11/01/2024 11:15 AM Tia Collar, PT [facility]   11/06/2024 11:15 AM Tia Collar, PT [facility]   11/08/2024 11:15 AM Tia Collar, PT [facility]   11/13/2024 11:15 AM Tia Collar, PT [facility]   11/15/2024 11:15 AM Tia Collar, PT [facility]   11/20/2024  1:15 PM Tia Collar, PT [facility]   11/22/2024 10:30 AM Tia Collar, PT [facility]   11/27/2024  2:00 PM Tia Collar, PT [facility]   11/29/2024 10:30 AM Tia Collar, PT [facility]   12/29/2024 10:30 AM Geno, Prentice DASEN, MD [facility]   01/01/2025  8:15 AM Ricke Rosina NOVAK, APRN - CNP PGU200 [facility]   03/15/2025 10:10 AM Lear Nest L, PA PSCD [facility] AM[facility]        "

## 2024-10-05 NOTE — Therapy Evaluation (Signed)
 "     Lee Page  DOB: 07/01/1951  Primary: Medicare Part A And B (Medicare)  Secondary: [place]ARE SUPP [facility]  [address]  [address]  [place] [address]  Phone: [phone]  Fax: [phone] Plan Frequency: 2x8 weeks    Plan of Care/Certification Expiration Date: 01/03/25      Events      Plan of Care/Certification Expiration Date:  Plan of Care/Certification Expiration Date: 01/03/25    Frequency/Duration: Plan Frequency: 2x8 weeks      Time In/Out:   Time In: 0945  Time Out: 1039  Minutes: 54     PT Visit Info:    Progress Note Due Date: 11/04/24  Total # of Visits to Date: 1      Visit Count:  1                OUTPATIENT PHYSICAL THERAPY:             Initial Assessment 10/05/2024               Episode (Dizziness)         Treatment Diagnosis:     Dizziness and giddiness  Difficulty in walking, not elsewhere classified  Muscle weakness (generalized)  Medical/Referring Diagnosis:    Dizziness [R42]  Disequilibrium [R42]  Referring Physician:  Geno Prentice DASEN, MD    MD Orders:  PT Eval and Treat   Return MD Appt:  TBD  Date of Onset:  Onset Date: -- (4-5 months ago)     Allergies:  Antihistamines, diphenhydramine-type; Penicillins; Statins; Sulfa antibiotics; and Codeine  Restrictions/Precautions:    Fall Precautions:        Medications Last Reviewed: 10/05/2024     SUBJECTIVE   History of Injury/Illness (Reason for Referral): He says he has had dizziness for about 4-4months. Going down steps and he feels that the last step-blends in and he had trouble navigating. He has only one fall in the recent past. He does report he is nervous about falling. He says he gets dizziness when he gets out of bed but not rolling over or getting into bed. He feels walking down a hallway he feels unsteady, like wobbles from side to side. He lives with his wife in an retirement community 1st floor. He feels confident going up the stairs but he admit he uses the elevator  instead of down steps. He admits he needs to get his eyes checked. He reports that his dizziness is more unsteady and wobbly,not spinny. He does have diabetes and hasn't had his blood sugar monitor on for 2 months and has neuropathy.  Patient Stated Goal(s):  better balance  Initial Pain Level:      0/10   Post Session Pain Level:     0/10  Past Medical History/Comorbidities:   Lee Page  has a past medical history of Anxiety, Colon polyp, Depression, Erectile dysfunction, Hypertension, Kidney stone, Neuropathy, Obesity, Restless legs syndrome, Sleep apnea, and Type 2 diabetes mellitus without complication.  Lee Page  has a past surgical history that includes Appendectomy (1974?); eye surgery (2000?); Tonsillectomy (1960?); Colonoscopy (N/A, 02/24/2023); Cystoscopy (09/29/2021); Kidney stone surgery (2021 or 2022); and sinus surgery.  Social History/Living Environment:   Patient lives with their spouse  Type of Home: House: One Story    Prior Level of Function/Work/Activity:   Prior Level of Function: Independent   Current Level of Function: Mod Independent      Learning:  Does the patient/guardian have any barriers to learning?: (Patient-Rptd) No barriers  How does the patient/guardian prefer to learn new concepts?: (Patient-Rptd) Listening; Reading; Demonstration; Pictures/Videos    Fall Risk Scale:   Morse Total Score: 35         OBJECTIVE     Gait:  nothing to note       MMTs DATE  10/05/24  DATE     Hip Flexion R: 3+  L: 3+ R:   L:    Knee Ext R: 4-  L: 4 R:  L:   Knee Flex R: 3  L: 3+ R:  L:   Hip ER R: 3-  L: 3 R:   L:    Hip IR R: 3-  L: 3 R:   L:    Ankle Dorsiflexion  R: 4  L: 4 R:   L:     Special Test/Function:  Sit-stand: 30 sec Chair Rise: not tested due to time    Tests Date:  10/05/24  Date:   Date:      Parameters Parameters Parameters   Smooth Pursuit Hor: lots of catch ups, no symptoms  Vert: lots of catch ups, no symptoms     Saccades Hor: anticipates but no overs/unders  Vert:  anticipates but no overs/unders     VOR C Hor: hard to focus  Vert: hard to focus           BPPV testing L: not tested, not indicated  R: not tested, not indicated           Dizziness Descriptors Imbalance, unsteady               Outcome Measure:   Tool Used: Dizziness Handicap Index  Score:  Initial: 38  Most Recent: X (Date: -- )   Interpretation of Score: To each item, the following scores may be assigned: No = 0, Sometimes = 2, Yes = 4.  Scores greater than 10 points should be referred to balance specialists for further evaluation.     ASSESSMENT   Initial Assessment:  Patient is a 73 year old male who presents to PT services with signs and symptoms consistent with unsteadiness on feet including limited lumbar/hip ROM, BLE weakness, decreased balance, decreased endurance, decreased muscle power. These deficits impair ability to ambulate on uneven ground, bend, squat, stoop, lift, carry, complete ADLs/IADLs such as transfers, dressing, and participate in preferred activities such as spending time with his wife and friends. He would benefit from skilled PT services to address the above-named deficits, increase their independence, decrease symptom severity, increase confidence, and safely and effectively return to PLOF.   Therapy Problem List: (Impacting functional limitations):    Decreased Strength, Decreased ROM, Decreased Functional Mobility, Decreased Independence with Home Exercise Program, Decreased Posture, Decreased Balance, Decreased Body Mechanics, and Decreased Activity Tolerance/Endurance*   Therapy Prognosis:   Good     Initial Assessment Complexity:   Low Complexity       PLAN   Effective Dates: 10/05/2024 TO Plan of Care/Certification Expiration Date: 01/03/25     Frequency/Duration: Plan Frequency: 2x8 weeks      Interventions Planned (Treatment may consist of any combination of the following):    Location Manager, Building Services Engineer, Investment Banker, Operational, Home Exercise Program (HEP), Manual  Therapy, Neuromuscular Re-education/Strengthening, Therapeutic Activites, Therapeutic Exercise/Strengthening, and Vestibular Rehab   Goals: (Goals have been discussed and agreed upon with patient.)  Short Term Goals: 4 weeks  1. Pt will be independent  w/ HEP to improve outcomes and increase functional balance abilities.   2. Pt will report an increase in confidence with ambulating around her home and bending to pick up objects  3. Pt will demonstrate 10 STS in 30 seconds from a standard chair  Long Term Goals. 8  weeks  1. Pt will have DHI score of 20 or less showing decreased dizzy and decreased functional impairments.   2. Pt will demonstrate 4+/5 gross BLE strength for squatting and transfers  3. Pt will demonstrate walking balance with head movements without increase in symptoms for community navigation        Medical Necessity:   > Patient is expected to demonstrate progress in strength, balance, coordination, and functional technique to improve safety during ambulation, transfers.  Reason For Services/Other Comments:  > Patient continues to require skilled intervention due to dizziness with bending.    Regarding Lee Page's therapy, I certify that the treatment plan above will be carried out by a therapist or under their direction.  Thank you for this referral,  [name], PT     Referring Physician Signature: Geno Prentice DASEN, MD                    Charge Capture  Events  Appt Desk  Attendance Report         "

## 2024-10-06 ENCOUNTER — Encounter

## 2024-10-09 ENCOUNTER — Encounter

## 2024-10-09 ENCOUNTER — Encounter: Payer: MEDICARE | Primary: Family

## 2024-10-10 ENCOUNTER — Encounter

## 2024-10-10 LAB — COMPREHENSIVE METABOLIC PANEL
ALT: 32 U/L (ref 8–55)
AST: 18 U/L (ref 15–37)
Albumin/Globulin Ratio: 1.4 (ref 1.0–1.9)
Albumin: 3.9 g/dL (ref 3.2–4.6)
Alk Phosphatase: 49 U/L (ref 40–129)
Anion Gap: 10 mmol/L (ref 7–16)
BUN: 19 mg/dL (ref 8–23)
CO2: 28 mmol/L (ref 20–29)
Calcium: 9.6 mg/dL (ref 8.8–10.2)
Chloride: 101 mmol/L (ref 98–107)
Creatinine: 1.03 mg/dL (ref 0.80–1.30)
Est, Glom Filt Rate: 78 ml/min/1.73m2 (ref >60)
Globulin: 2.7 g/dL (ref 2.3–3.5)
Glucose: 117 mg/dL — ABNORMAL HIGH (ref 70–99)
Potassium: 4.5 mmol/L (ref 3.5–5.1)
Sodium: 139 mmol/L (ref 136–145)
Total Bilirubin: 0.3 mg/dL (ref 0.0–1.2)
Total Protein: 6.6 g/dL (ref 6.3–8.2)

## 2024-10-10 LAB — LIPID PANEL
Chol/HDL Ratio: 3.2 (ref 0.0–5.0)
Cholesterol, Total: 127 mg/dL (ref 0–200)
HDL: 39 mg/dL — ABNORMAL LOW (ref 40–60)
LDL Cholesterol: 63 mg/dL (ref 0–100)
Triglycerides: 122 mg/dL (ref 0–150)
VLDL Cholesterol Calculated: 24 mg/dL — ABNORMAL HIGH (ref 6–23)

## 2024-10-10 LAB — ALBUMIN/CREATININE RATIO, URINE
Albumin Urine: 2.7 mg/dL (ref 0.00–20.00)
Albumin/Creatinine Ratio: 13 mg/g (ref 0–30)
Creatinine, Ur: 208 mg/dL (ref 39.00–259.00)

## 2024-10-10 LAB — PSA SCREENING: PSA: 0.1 ng/mL (ref 0.0–4.0)

## 2024-10-10 LAB — HEMOGLOBIN A1C
Estimated Avg Glucose: 130 mg/dL
Hemoglobin A1C: 6.2 % — ABNORMAL HIGH (ref 0–5.6)

## 2024-10-10 LAB — TSH: TSH, 3rd Generation: 1.84 u[IU]/mL (ref 0.270–4.200)

## 2024-10-12 ENCOUNTER — Inpatient Hospital Stay: Admit: 2024-10-12 | Payer: MEDICARE | Primary: Family

## 2024-10-12 NOTE — Progress Notes (Signed)
 "Lee Page  DOB: [birth year]  Primary: Medicare Part A And B (Medicare)  Secondary: [place]ARE SUPP [facility]  [address]  [address]  [place] [address]  Phone: [phone]  Fax: [phone] Plan Frequency: 2x8 weeks  Plan of Care/Certification Expiration Date: 01/03/25        Plan of Care/Certification Expiration Date:  Plan of Care/Certification Expiration Date: 01/03/25    Frequency/Duration: Plan Frequency: 2x8 weeks      Time In/Out:   Time In: 1447  Time Out: 1532  Minutes: 45      PT Visit Info:    Progress Note Due Date: 11/04/24  Total # of Visits to Date: 2      Visit Count:  2    OUTPATIENT PHYSICAL THERAPY:   Treatment Note 10/12/2024       Episode  (Dizziness)               Treatment Diagnosis:    Dizziness and giddiness  Difficulty in walking, not elsewhere classified  Muscle weakness (generalized)  Medical/Referring Diagnosis:    Dizziness [R42]  Disequilibrium [R42]      Referring Physician:  Geno Prentice DASEN, MD  MD Orders:  PT Eval and Treat   Return MD Appt:  TBD   Date of Onset:  Onset Date: -- (4-5 months ago)     Allergies:   Antihistamines, diphenhydramine-type; Penicillins; Statins; Sulfa antibiotics; and Codeine  Restrictions/Precautions:   Fall Precautions:        Interventions Planned (Treatment may consist of any combination of the following):     See Assessment Note    Subjective Comments:   Pt says he has had a long week, dizziness has been good, hasn't noticed anything because the rest of the week has been crazy  Initial Pain Level:     0/10  Post Session Pain Level:      0/10  Medications Last Reviewed: 10/12/2024  Updated Objective Findings:  None Today  Treatment     Therapeutic Exercises:  (8 minutes)  -The following interventions were performed to improve ROM, strength, balance, endurance or aerobic capacity to improve exercise tolerance and facilitate return to functional ADLs.     Date:  10/05/24  Date:  10/12/24   Date:     Activity/Exercise Parameters Parameters Parameters   Education Tissue healing, anatomy, mechanics, balance principles, sensory integration; strengthening principles, HEP     Nustep/bike  Nustep lv3 hills    Mobility                Neuromuscular Re-education: (37 minutes)  -The following interventions were performed with appropriate cueing to improve balance, proprioception, and motor coordination to reduce fall risk and improve performance of static and dynamic motor tasks.       Date:  10/05/24  Date:  10/12/24  Date:     Activity/Exercise Parameters Parameters Parameters   Seated Balance -HT/HN x10 EO/EC  -forward bends 2x10 EO/EC each Seated on DD with normal stance:  -HT/HN x15 EO/EC  -forward bends 3HP x15 EO/EC each, each way  -static FB w/HT/HN x15 EO/EC each  -lateral weight-shift 1x10 EO/EC each    Standing balance  With 2 sticks  -HT/HN x10 EO/EC    Walking balance                             **All with extensive cuing (tac, vis, verbal, man);  for recruitment, motor control, proprioception Date:  10/05/24  Date:   Date:     Activity/Exercise Parameters Parameters Parameters   Transfer therex X15 marches B  X15 heel raises seated B  X15 laqs B                           Therapeutic Activity:  (0 minutes)  -The following interventions were performed to improve functional mobility demands, endurance, and motor coordination to optimize performance of daily functional tasks.    GAIT TRAINING: (0 minutes):    Gait training to improve and/or restore physical functioning as related to strength, balance, and coordination.  Ambulated 125 feet with supervision/set-up and moderate visual cueing, verbal cueing, and tactile cueing related to their stance phase and stride length to promote proper body posture and promote proper body mechanics..  Instruction in performance of SPC to correct stride length, push off, and hip position and motion.      Treatment/Session Summary:    Treatment Assessment:   Pt  presents with low irritability and does not exceed 3/10 with any therex. Continued with seated balance on dyna disc and introduced standing with UE support. Pt would continue to benefit from skilled PT services to address the above deficits, increase confidence, increase independence, and progress towards goals.   Communication/Consultation:  None today  Equipment provided today:  HEP and Medbridge exercise access code: see below  Recommendations/Intent for next treatment session: Next visit will focus on overall BLE functional strengthening, seated balance progressing to standing and stepping therex as tol.    >Total Treatment Billable Duration:  45 minutes (8 TE 37 neuro)  Time In: 1447  Time Out: 1532     [name], PT       Access Code: [email or web address]  URL: [email or web address]  Date: 10/05/2024  Prepared by: [name]    Exercises  - Seated Cervical Rotation AROM  - 3 x daily - 7 x weekly - 3 sets - 10 reps  - Seated Cervical Extension AROM  - 3 x daily - 7 x weekly - 3 sets - 10 reps  - Seated Forward Bending  - 3 x daily - 7 x weekly - 3 sets - 10 reps  - Forward Bending Hip Flexion with Flat Lumbar Spine  - 3 x daily - 7 x weekly - 3 sets - 10 reps  - Supine Lower Trunk Rotation  - 3 x daily - 7 x weekly - 3 sets - 10 reps  - Clamshell  - 3 x daily - 7 x weekly - 3 sets - 10 reps  - Supine Bridge  - 3 x daily - 7 x weekly - 3 sets - 10 reps  - Sidelying Hip Abduction  - 3 x daily - 7 x weekly - 3 sets - 10 reps  - Supine Active Straight Leg Raise  - 3 x daily - 7 x weekly - 3 sets - 10 reps  - Standing Hip Abduction with Counter Support  - 3 x daily - 7 x weekly - 3 sets - 10 reps  - Standing Hip Extension with Counter Support  - 3 x daily - 7 x weekly - 3 sets - 10 reps  - Heel Raises with Counter Support  - 3 x daily - 7 x weekly - 3 sets - 10 reps    Charge Capture  Events  MedBridge Portal  Appt Desk  Attendance Report  Future Appointments   Date Time Provider Department Center    10/16/2024  1:15 PM Tia Collar, PT [facility]   10/17/2024 11:00 AM Lovell-Sherman, Laneta DEL, APRN - CNP [facility] DEP   10/18/2024  2:00 PM Tia Collar, PT [facility]   10/23/2024  1:15 PM Tia Collar, PT [facility]   10/25/2024 11:15 AM Tia Collar, PT [facility]   10/30/2024 10:30 AM Tia Collar, PT [facility]   10/30/2024  3:00 PM Long, Evonne, AuD [facility]   11/01/2024 11:15 AM Tia Collar, PT [facility]   11/06/2024 11:15 AM Tia Collar, PT [facility]   11/08/2024 11:15 AM Tia Collar, PT [facility]   11/13/2024 11:15 AM Tia Collar, PT [facility]   11/15/2024 11:15 AM Tia Collar, PT [facility]   11/20/2024  1:15 PM Tia Collar, PT [facility]   11/22/2024 10:30 AM Tia Collar, PT [facility]   11/27/2024  2:00 PM Tia Collar, PT [facility]   11/29/2024 10:30 AM Tia Collar, PT [facility]   12/29/2024 10:30 AM Geno, Prentice DASEN, MD [facility]   01/01/2025  8:15 AM Ricke Rosina NOVAK, APRN - CNP [id number] [facility]   03/15/2025 10:10 AM Lear Nest L, PA [facility]        "

## 2024-10-16 ENCOUNTER — Inpatient Hospital Stay: Admit: 2024-10-16 | Payer: MEDICARE | Primary: Family

## 2024-10-16 NOTE — Progress Notes (Signed)
 "Lee Page  DOB: [birth year]  Primary: Medicare Part A [place] B (Medicare)  Secondary: [place]ARE SUPP [facility]  [address]  [address]  [place] [address]  Phone: [phone]  Fax: [phone] Plan Frequency: 2x8 weeks  Plan of Care/Certification Expiration Date: 01/03/25        Plan of Care/Certification Expiration Date:  Plan of Care/Certification Expiration Date: 01/03/25    Frequency/Duration: Plan Frequency: 2x8 weeks      Time In/Out:   Time In: 1313  Time Out: 1400  Minutes: 47      Lee Page Visit Info:    Progress Note Due Date: 11/04/24  Total # of Visits to Date: 3      Visit Count:  3    OUTPATIENT PHYSICAL THERAPY:   Treatment Note 10/16/2024       Episode  (Dizziness)               Treatment Diagnosis:    Dizziness and giddiness  Difficulty in walking, not elsewhere classified  Muscle weakness (generalized)  Medical/Referring Diagnosis:    Dizziness [R42]  Disequilibrium [R42]      Referring Physician:  Geno Prentice DASEN, MD  MD Orders:  Lee Page Eval and Treat   Return MD Appt:  TBD   Date of Onset:  Onset Date: -- (4-5 months ago)     Allergies:   Antihistamines, diphenhydramine-type; Penicillins; Statins; Sulfa antibiotics; and Codeine  Restrictions/Precautions:   Fall Precautions:        Interventions Planned (Treatment may consist of any combination of the following):     See Assessment Note    Subjective Comments:   Lee Page says he had rough weekend with unsteadiness. Doing okay right now, but using the cane for stability  Initial Pain Level:     0/10  Post Session Pain Level:      0/10  Medications Last Reviewed: 10/16/2024  Updated Objective Findings:  None Today  Treatment     Therapeutic Exercises:  (8 minutes)  -The following interventions were performed to improve ROM, strength, balance, endurance or aerobic capacity to improve exercise tolerance and facilitate return to functional ADLs.     Date:  10/05/24  Date:  10/12/24  Date:  10/16/24     Activity/Exercise Parameters Parameters Parameters   Education Tissue healing, anatomy, mechanics, balance principles, sensory integration; strengthening principles, HEP     Nustep/bike  Nustep lv3 hills Nustep lv4 hills   Mobility                Neuromuscular Re-education: (39 minutes)  -The following interventions were performed with appropriate cueing to improve balance, proprioception, and motor coordination to reduce fall risk and improve performance of static and dynamic motor tasks.       Date:  10/05/24  Date:  10/12/24  Date:  10/16/24    Activity/Exercise Parameters Parameters Parameters   Seated Balance -HT/HN x10 EO/EC  -forward bends 2x10 EO/EC each Seated on DD with normal stance:  -HT/HN x15 EO/EC  -forward bends 3HP x15 EO/EC each, each way  -static FB w/HT/HN x15 EO/EC each  -lateral weight-shift 1x10 EO/EC each Seated on DD with normal stance:  -HT/HN 2x15 EO/EC  -forward bends 3HP x15 EO/EC each, each way  -seated flat 1/4 turns with stepping; 1x15 EO/EC each  -chest press with narrow stance #8 x15  -OH press with narrow stance 8# x15   Standing balance  With 2 sticks  -HT/HN  x10 EO/EC    Walking balance                             **All with extensive cuing (tac, vis, verbal, man); for recruitment, motor control, proprioception Date:  10/05/24  Date:   Date:     Activity/Exercise Parameters Parameters Parameters   Transfer therex X15 marches B  X15 heel raises seated B  X15 laqs B                           Therapeutic Activity:  (0 minutes)  -The following interventions were performed to improve functional mobility demands, endurance, and motor coordination to optimize performance of daily functional tasks.    GAIT TRAINING: (0 minutes):    Gait training to improve and/or restore physical functioning as related to strength, balance, and coordination.  Ambulated 125 feet with supervision/set-up and moderate visual cueing, verbal cueing, and tactile cueing related to their stance phase  and stride length to promote proper body posture and promote proper body mechanics..  Instruction in performance of SPC to correct stride length, push off, and hip position and motion.      Treatment/Session Summary:    Treatment Assessment:   Lee Page presents with moderate irritability. Held progressive therex due to increased dizziness sensitivity with therex this date. Continued with seated balance on dyna disc and added seated stepping/turns. Lee Page requires reminders to ground Lee Page would continue to benefit from skilled Lee Page services to address the above deficits, increase confidence, increase independence, and progress towards goals.   Communication/Consultation:  None today  Equipment provided today:  HEP and Medbridge exercise access code: see below  Recommendations/Intent for next treatment session: Next visit will focus on overall BLE functional strengthening, seated balance progressing to standing and stepping therex as tol.    >Total Treatment Billable Duration:  47 minutes (8 TE 39 neuro)  Time In: 1313  Time Out: 1400     Lee Page, Lee Page       Access Code: [email or web address]  URL: [email or web address]  Date: 10/05/2024  Prepared by: Lee Page    Exercises  - Seated Cervical Rotation AROM  - 3 x daily - 7 x weekly - 3 sets - 10 reps  - Seated Cervical Extension AROM  - 3 x daily - 7 x weekly - 3 sets - 10 reps  - Seated Forward Bending  - 3 x daily - 7 x weekly - 3 sets - 10 reps  - Forward Bending Hip Flexion with Flat Lumbar Spine  - 3 x daily - 7 x weekly - 3 sets - 10 reps  - Supine Lower Trunk Rotation  - 3 x daily - 7 x weekly - 3 sets - 10 reps  - Clamshell  - 3 x daily - 7 x weekly - 3 sets - 10 reps  - Supine Bridge  - 3 x daily - 7 x weekly - 3 sets - 10 reps  - Sidelying Hip Abduction  - 3 x daily - 7 x weekly - 3 sets - 10 reps  - Supine Active Straight Leg Raise  - 3 x daily - 7 x weekly - 3 sets - 10 reps  - Standing Hip Abduction with Counter Support  - 3 x daily - 7 x weekly - 3 sets - 10  reps  - Standing Hip Extension with Counter Support  - 3 x daily -  7 x weekly - 3 sets - 10 reps  - Heel Raises with Counter Support  - 3 x daily - 7 x weekly - 3 sets - 10 reps    Charge Capture  Events  MedBridge Portal  Appt Desk  Attendance Report     Future Appointments   Date Time Provider Department Center   10/17/2024 11:00 AM Kayren Laneta DEL, APRN - CNP [facility] DE[facility]   10/18/2024  2:00 PM Tia Collar, Lee Page [facility]   10/23/2024  1:15 PM Tia Collar, Lee Page [facility]   10/25/2024 11:15 AM Tia Collar, Lee Page [facility]   10/30/2024 10:30 AM Tia Collar, Lee Page [facility]   10/30/2024  3:00 PM Long, Evonne, AuD [facility]   11/01/2024 11:15 AM Tia Collar, Lee Page [facility]   11/06/2024 11:15 AM Tia Collar, Lee Page [facility]   11/08/2024 11:15 AM Tia Collar, Lee Page [facility]   11/13/2024 11:15 AM Tia Collar, Lee Page [facility]   11/15/2024 11:15 AM Tia Collar, Lee Page [facility]   11/20/2024  1:15 PM Tia Collar, Lee Page [facility]   11/22/2024 10:30 AM Tia Collar, Lee Page [facility]   11/27/2024  2:00 PM Tia Collar, Lee Page [facility]   11/29/2024 10:30 AM Tia Collar, Lee Page [facility]   12/29/2024 10:30 AM Geno, Prentice DASEN, MD [facility]   01/01/2025  8:15 AM Ricke Rosina NOVAK, APRN - CNP [id number] [facility]   03/15/2025 10:10 AM Lear Nest L, PA [facility] AM[facility]        "

## 2024-10-17 ENCOUNTER — Ambulatory Visit: Admit: 2024-10-17 | Discharge: 2024-10-17 | Payer: MEDICARE | Attending: Family | Primary: Family

## 2024-10-17 ENCOUNTER — Encounter

## 2024-10-17 VITALS — BP 126/84 | Ht 70.0 in | Wt 245.0 lb

## 2024-10-17 DIAGNOSIS — I10 Essential (primary) hypertension: Principal | ICD-10-CM

## 2024-10-17 LAB — CBC WITH AUTO DIFFERENTIAL
Basophils %: 0.5 % (ref 0.0–2.0)
Basophils Absolute: 0.03 K/UL (ref 0.00–0.20)
Eosinophils %: 1.9 % (ref 0.5–7.8)
Eosinophils Absolute: 0.12 K/UL (ref 0.00–0.80)
Hematocrit: 39.7 % — ABNORMAL LOW (ref 41.1–50.3)
Hemoglobin: 13.2 g/dL — ABNORMAL LOW (ref 13.6–17.2)
Immature Granulocytes %: 0.3 % (ref 0.0–5.0)
Immature Granulocytes Absolute: 0.02 K/UL (ref 0.0–0.5)
Lymphocytes %: 30.5 % (ref 13.0–44.0)
Lymphocytes Absolute: 1.94 K/UL (ref 0.50–4.60)
MCH: 28.6 pg (ref 26.1–32.9)
MCHC: 33.2 g/dL (ref 31.4–35.0)
MCV: 86.1 FL (ref 82–102)
MPV: 9.6 FL (ref 9.4–12.3)
Monocytes %: 7.7 % (ref 4.0–12.0)
Monocytes Absolute: 0.49 K/UL (ref 0.10–1.30)
Neutrophils %: 59.1 % (ref 43.0–78.0)
Neutrophils Absolute: 3.77 K/UL (ref 1.70–8.20)
Platelets: 279 K/uL (ref 150–450)
RBC: 4.61 M/uL (ref 4.23–5.6)
RDW: 13.4 % (ref 11.9–14.6)
WBC: 6.4 K/uL (ref 4.3–11.1)
nRBC: 0 K/uL (ref 0.0–0.2)

## 2024-10-17 LAB — SEDIMENTATION RATE: Sed Rate, Automated: 2 mm/h — ABNORMAL LOW

## 2024-10-17 MED ORDER — ATORVASTATIN CALCIUM 80 MG PO TABS
80 | ORAL_TABLET | Freq: Every day | ORAL | 3 refills | 90.00000 days | Status: AC
Start: 2024-10-17 — End: ?

## 2024-10-17 MED ORDER — OMEPRAZOLE 40 MG PO CPDR
40 | ORAL_CAPSULE | Freq: Every day | ORAL | 3 refills | 90.00000 days | Status: AC
Start: 2024-10-17 — End: ?

## 2024-10-17 MED ORDER — GABAPENTIN 300 MG PO CAPS
300 | ORAL_CAPSULE | ORAL | 2 refills | 30.00000 days | Status: AC
Start: 2024-10-17 — End: 2025-10-17

## 2024-10-17 MED ORDER — METFORMIN HCL 1000 MG PO TABS
1000 | ORAL_TABLET | Freq: Two times a day (BID) | ORAL | 3 refills | 90.00000 days | Status: AC
Start: 2024-10-17 — End: ?

## 2024-10-17 MED ORDER — SEMAGLUTIDE(0.25 OR 0.5MG/DOS) 2 MG/3ML SC SOPN
2 | SUBCUTANEOUS | 5 refills | 28.00000 days | Status: AC
Start: 2024-10-17 — End: ?

## 2024-10-17 MED ORDER — FENOFIBRIC ACID 135 MG PO CPDR
135 | ORAL_CAPSULE | Freq: Every day | ORAL | 3 refills | 90.00000 days | Status: AC
Start: 2024-10-17 — End: ?

## 2024-10-17 MED ORDER — FINASTERIDE 5 MG PO TABS
5 | ORAL_TABLET | Freq: Every day | ORAL | 3 refills | 90.00000 days | Status: AC
Start: 2024-10-17 — End: ?

## 2024-10-17 MED ORDER — BUPROPION HCL ER (SR) 150 MG PO TB12
150 | ORAL_TABLET | Freq: Two times a day (BID) | ORAL | 3 refills | 90.00000 days | Status: AC
Start: 2024-10-17 — End: ?

## 2024-10-17 MED ORDER — TOUJEO MAX SOLOSTAR 300 UNIT/ML SC SOPN
300 | SUBCUTANEOUS | 5 refills | 60.00000 days | Status: AC
Start: 2024-10-17 — End: ?

## 2024-10-17 MED ORDER — LOSARTAN POTASSIUM 25 MG PO TABS
25 | ORAL_TABLET | Freq: Every day | ORAL | 3 refills | 90.00000 days | Status: DC
Start: 2024-10-17 — End: 2024-11-27

## 2024-10-17 MED ORDER — TADALAFIL 5 MG PO TABS
5 | ORAL_TABLET | Freq: Every morning | ORAL | 3 refills | 30.00000 days | Status: AC
Start: 2024-10-17 — End: ?

## 2024-10-17 NOTE — Progress Notes (Signed)
 "PROGRESS NOTE          Chief Complaint   Patient presents with    Bowel Movements     Patient reports change in bowel movements recently. Patient reports he is having leakage of his stool at times and reports gas while having a bowel movement on the toilet.    Memory Loss     Patient reports some memory loss recently, along with confusion and irritability.        ASSESSMENT AND PLAN    Lee Page was seen today for bowel movements and memory loss.    Diagnoses and all orders for this visit:    Primary hypertension  -     losartan  (COZAAR ) 25 MG tablet; Take 1 tablet by mouth daily    Pure hypercholesterolemia  -     atorvastatin  (LIPITOR) 80 MG tablet; Take 1 tablet by mouth daily  -     fenofibric acid  (TRILIPIX ) 135 MG CPDR capsule; Take 1 capsule by mouth daily  -     Comprehensive Metabolic Panel; Future  -     CBC with Auto Differential; Future    Type 2 diabetes mellitus without complication, without long-term current use of insulin  (HCC)  -     Hemoglobin A1C; Future    OSA (obstructive sleep apnea)    Chronic venous insufficiency    Current mild episode of major depressive disorder, unspecified whether recurrent    Anxiety  -     buPROPion  (WELLBUTRIN  SR) 150 MG extended release tablet; Take 1 tablet by mouth 2 times daily    Benign prostatic hyperplasia with weak urinary stream  -     tadalafil  (CIALIS ) 5 MG tablet; Take 1 tablet by mouth every morning  -     finasteride  (PROSCAR ) 5 MG tablet; Take 1 tablet by mouth daily    Restless leg syndrome  -     gabapentin  (NEURONTIN ) 300 MG capsule; 2 capsules at bedtime    Obesity (BMI 30-39.9)    Type 2 diabetes mellitus with hyperglycemia, without long-term current use of insulin  (HCC)  -     Semaglutide ,0.25 or 0.5MG /DOS, 2 MG/3ML SOPN; 0.5 mg weekly  -     Insulin  Glargine, 2 Unit Dial, (TOUJEO  MAX SOLOSTAR) 300 UNIT/ML concentrated injection pen; 50 units daily.  -     metFORMIN  (GLUCOPHAGE ) 1000 MG tablet; Take 1 tablet by mouth 2 times daily (with  meals)    Gastroesophageal reflux disease without esophagitis  -     omeprazole  (PRILOSEC) 40 MG delayed release capsule; Take 1 capsule by mouth every morning (before breakfast)    Change in stool  -     CBC with Auto Differential; Future    Mild cognitive impairment  -     Vitamin B12; Future    Diarrhea, unspecified type  -     Sedimentation Rate; Future    Reviewed labs and discussed significance.  Reviewed notes from ENT, sleep medicine, neurology.  A1c controlled with current regimen.  Will continue and follow.  LDL to goal.  Continue statin and fenofibrate.  Will follow.  Advised increased fiber, fluids and probiotic.  Consider GI evaluation with persistent stool changes.  Check CBC and sed rate today.  Encouraged to return to regular exercise-recumbent or stationary bike to improve emotional wellbeing as well as physical health.  Consider increased dosage of Wellbutrin .  MMSE 29/30.  Will work on exercise and healthy lifestyle habits.  Will follow.    Time:  I spent over 40 minutes preparing to see patient (including chart review and preparation), reconciling medications, obtaining and/or reviewing additional medical history, performing a physical exam and evaluation, documenting clinical information in the electronic health record, independently interpreting results, communicating results to patient, family or caregiver, and/or coordinating care.       SUBJECTIVE    Hypertension: Losartan  25 mg daily    Type 2 diabetes mellitus: Takes metformin  1000 mg twice daily, Ozempic  0.5 mg weekly, and Toujeo  50 units daily.  A1c looks great down to 6.2%    GERD: Antireflux measures. 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  Topical Voltaren gel.    Obstructive sleep apnea: Followed by sleep medicine.  Compliant with CPAP but chronic sleep issues. .    Mixed hyperlipidemia: Continues atorvastatin  80 mg and fenofibrate 135 mg    Mild depression, chronic insomnia: Last visit discussed  importance of regular physical exercise.  Continues Wellbutrin  XL 150 mg daily.  Also trazodone  50 mg at bedtime    BPH with weak urinary stream: Followed by urology.  Cialis  5 mg daily.  Also finasteride  5 mg daily and Rapaflo  8 mg daily    Restless leg syndrome: Gabapentin  300 mg 2 capsules at bedtime.    Decreased hearing: Evaluated by ENT. Will have audiogram    Memory problems: Names, words. Forgetful. Extensive memory evaluation in past without significant finding (including MRI). Poor sleep despite trazodone  and CPAP.    Bowel changes: Increased flatus. Starts formed and ends with liquid stool or sticky like peanut butter. Possible occasional mucous. Normal colonoscopy 2024. Not eating as much vegetables.         Review of Systems   Constitutional:  Negative for chills, fatigue, fever and unexpected weight change.   HENT:  Positive for hearing loss. Negative for congestion, ear pain, tinnitus and voice change.    Eyes:  Negative for pain, redness and visual disturbance.   Respiratory:  Negative for cough and shortness of breath.    Cardiovascular:  Negative for chest pain, palpitations and leg swelling.   Gastrointestinal:  Positive for diarrhea. Negative for abdominal pain, blood in stool (no melena or red blood), constipation and nausea.   Endocrine: Negative for cold intolerance and heat intolerance.   Genitourinary:  Positive for difficulty urinating. Negative for dysuria, frequency, hematuria and urgency.   Musculoskeletal:  Positive for arthralgias. Negative for myalgias.   Skin:  Negative for color change and rash.   Neurological:  Negative for dizziness, weakness, numbness and headaches.   Hematological:  Negative for adenopathy. Does not bruise/bleed easily.   Psychiatric/Behavioral:  Positive for decreased concentration and sleep disturbance. Negative for dysphoric mood. The patient is not nervous/anxious.        OBJECTIVE    BP 126/84   Ht 1.778 m (5' 10)   Wt 111.1 kg (245 lb)   BMI 35.15  kg/m     Physical Exam  Vitals reviewed.   Constitutional:       General: He is not in acute distress.     Appearance: He is obese.   HENT:      Head: Normocephalic and atraumatic.      Right Ear: Tympanic membrane and ear canal normal.      Left Ear: Ear canal normal.      Ears:      Comments: Left tm dull without serous fluid     Nose: Nose normal.   Eyes:  Conjunctiva/sclera: Conjunctivae normal.   Neck:      Thyroid: No thyroid mass, thyromegaly or thyroid tenderness.   Cardiovascular:      Rate and Rhythm: Normal rate and regular rhythm.      Pulses:           Radial pulses are 2+ on the right side.        Dorsalis pedis pulses are 2+ on the right side and 2+ on the left side.        Posterior tibial pulses are 2+ on the right side and 2+ on the left side.      Heart sounds: Murmur heard.   Pulmonary:      Effort: Pulmonary effort is normal.      Breath sounds: Normal breath sounds. No rales.   Chest:   Breasts:     Right: Normal.      Left: Normal.   Abdominal:      Palpations: Abdomen is soft. There is no hepatomegaly, splenomegaly or mass.      Tenderness: There is no abdominal tenderness.   Musculoskeletal:      Cervical back: Normal range of motion.      Right lower leg: 1+ Edema present.      Left lower leg: 1+ Edema present.   Feet:      Right foot:      Toenail Condition: Right toenails are normal.      Left foot:      Skin integrity: Skin integrity normal.      Toenail Condition: Left toenails are normal.   Lymphadenopathy:      Cervical: No cervical adenopathy.      Upper Body:      Right upper body: No supraclavicular, axillary or pectoral adenopathy.      Left upper body: No supraclavicular, axillary or pectoral adenopathy.   Skin:     Findings: No rash.   Neurological:      Mental Status: He is alert and oriented to person, place, and time.      Motor: Motor function is intact.      Gait: Gait is intact.   Psychiatric:         Attention and Perception: Attention normal.         Speech: Speech  normal.         Behavior: Behavior normal.     Diabetic foot exam:   Left Foot:   Visual Exam: normal   Pulse DP: 2+ (normal)   Filament test: normal sensation    Right Foot:   Visual Exam: normal   Pulse DP: 2+ (normal)   Filament test: normal sensation          Current Outpatient Medications   Medication Sig Dispense Refill    tadalafil  (CIALIS ) 5 MG tablet Take 1 tablet by mouth every morning 90 tablet 3    Semaglutide ,0.25 or 0.5MG /DOS, 2 MG/3ML SOPN 0.5 mg weekly 3 mL 5    omeprazole  (PRILOSEC) 40 MG delayed release capsule Take 1 capsule by mouth every morning (before breakfast) 90 capsule 3    atorvastatin  (LIPITOR) 80 MG tablet Take 1 tablet by mouth daily 90 tablet 3    buPROPion  (WELLBUTRIN  SR) 150 MG extended release tablet Take 1 tablet by mouth 2 times daily 180 tablet 3    fenofibric acid  (TRILIPIX ) 135 MG CPDR capsule Take 1 capsule by mouth daily 90 capsule 3    finasteride  (PROSCAR ) 5 MG tablet Take 1 tablet by mouth  daily 90 tablet 3    gabapentin  (NEURONTIN ) 300 MG capsule 2 capsules at bedtime 180 capsule 2    Insulin  Glargine, 2 Unit Dial, (TOUJEO  MAX SOLOSTAR) 300 UNIT/ML concentrated injection pen 50 units daily. 15 mL 5    losartan  (COZAAR ) 25 MG tablet Take 1 tablet by mouth daily 90 tablet 3    metFORMIN  (GLUCOPHAGE ) 1000 MG tablet Take 1 tablet by mouth 2 times daily (with meals) 180 tablet 3    ipratropium (ATROVENT ) 0.06 % nasal spray 2 sprays by Each Nostril route 3 times daily as needed for Rhinitis 15 mL 5    traZODone  (DESYREL ) 50 MG tablet Take 1 tablet by mouth nightly 30 tablet 5    azelastine  (ASTELIN ) 0.1 % nasal spray 2 sprays by Nasal route 2 times daily Use in each nostril as directed 30 mL 11    Insulin  Pen Needle 32G X 4 MM MISC 1 each by Does not apply route daily 100 each 3    silodosin  (RAPAFLO ) 8 MG CAPS Take 1 capsule by mouth daily 90 capsule 3    blood glucose test strips (ASCENSIA AUTODISC VI;ONE TOUCH ULTRA TEST VI) strip 1 each daily As needed.      Lancets Ultra  Fine MISC by Does not apply route      sodium chloride  (OCEAN) 0.65 % nasal spray 1 spray by Nasal route as needed for Congestion OTC as directed      Cyanocobalamin (VITAMIN B 12 PO) Take 2,000 mcg by mouth OTC as directed      aspirin 81 MG EC tablet Take 1 tablet by mouth daily      Omega-3 Fatty Acids (OMEGA 3 PO) Take 500 mcg by mouth OTC as directed       No current facility-administered medications for this visit.     Allergies   Allergen Reactions    Antihistamines, Diphenhydramine-Type Other (See Comments)     Causes depression    Penicillins Hives    Statins Other (See Comments)    Sulfa Antibiotics Hives    Codeine Nausea And Vomiting     Recent Results (from the past 4 weeks)   Culture Sinus    Collection Time: 09/28/24  3:45 PM    Specimen: Sinus   Result Value Ref Range    Special Requests NO SPECIAL REQUESTS      Gram Stain FEW WBCS SEEN      Gram Stain OCCASIONAL Gram positive cocci      Gram Stain FEW GRAM POSITIVE RODS      Culture SCANT NORMAL RESPIRATORY FLORA     TSH    Collection Time: 10/10/24 10:13 AM   Result Value Ref Range    TSH, 3rd Generation 1.840 0.270 - 4.200 uIU/mL   Hemoglobin A1C    Collection Time: 10/10/24 10:13 AM   Result Value Ref Range    Hemoglobin A1C 6.2 (H) 0 - 5.6 %    Estimated Avg Glucose 130 mg/dL   Comprehensive Metabolic Panel    Collection Time: 10/10/24 10:13 AM   Result Value Ref Range    Sodium 139 136 - 145 mmol/L    Potassium 4.5 3.5 - 5.1 mmol/L    Chloride 101 98 - 107 mmol/L    CO2 28 20 - 29 mmol/L    Anion Gap 10 7 - 16 mmol/L    Glucose 117 (H) 70 - 99 mg/dL    BUN 19 8 - 23 MG/DL    Creatinine 8.96  0.80 - 1.30 MG/DL    Est, Glom Filt Rate 78 >60 ml/min/1.24m2    Calcium  9.6 8.8 - 10.2 MG/DL    Total Bilirubin 0.3 0.0 - 1.2 MG/DL    ALT 32 8 - 55 U/L    AST 18 15 - 37 U/L    Alk Phosphatase 49 40 - 129 U/L    Total Protein 6.6 6.3 - 8.2 g/dL    Albumin 3.9 3.2 - 4.6 g/dL    Globulin 2.7 2.3 - 3.5 g/dL    Albumin/Globulin Ratio 1.4 1.0 - 1.9     Lipid  Panel    Collection Time: 10/10/24 10:13 AM   Result Value Ref Range    Cholesterol, Total 127 0 - 200 MG/DL    Triglycerides 877 0 - 150 MG/DL    HDL 39 (L) 40 - 60 MG/DL    LDL Cholesterol 63 0 - 100 MG/DL    VLDL Cholesterol Calculated 24 (H) 6 - 23 MG/DL    Chol/HDL Ratio 3.2 0.0 - 5.0     Albumin/Creatinine Ratio, Urine    Collection Time: 10/10/24 10:13 AM   Result Value Ref Range    Albumin Urine 2.70 0.00 - 20.00 MG/DL    Creatinine, Ur 791.99 39.00 - 259.00 mg/dL    Albumin/Creatinine Ratio 13 0 - 30 mg/g   PSA Screening    Collection Time: 10/10/24 10:13 AM   Result Value Ref Range    PSA 0.1 0.0 - 4.0 ng/mL   Sedimentation Rate    Collection Time: 10/17/24 12:26 PM   Result Value Ref Range    Sed Rate, Automated 2 (L) 15 mm/hr   CBC with Auto Differential    Collection Time: 10/17/24 12:26 PM   Result Value Ref Range    WBC 6.4 4.3 - 11.1 K/uL    RBC 4.61 4.23 - 5.6 M/uL    Hemoglobin 13.2 (L) 13.6 - 17.2 g/dL    Hematocrit 60.2 (L) 41.1 - 50.3 %    MCV 86.1 82 - 102 FL    MCH 28.6 26.1 - 32.9 PG    MCHC 33.2 31.4 - 35.0 g/dL    RDW 86.5 88.0 - 85.3 %    Platelets 279 150 - 450 K/uL    MPV 9.6 9.4 - 12.3 FL    nRBC 0.00 0.0 - 0.2 K/uL    Differential Type AUTOMATED      Neutrophils % 59.1 43.0 - 78.0 %    Lymphocytes % 30.5 13.0 - 44.0 %    Monocytes % 7.7 4.0 - 12.0 %    Eosinophils % 1.9 0.5 - 7.8 %    Basophils % 0.5 0.0 - 2.0 %    Immature Granulocytes % 0.3 0.0 - 5.0 %    Neutrophils Absolute 3.77 1.70 - 8.20 K/UL    Lymphocytes Absolute 1.94 0.50 - 4.60 K/UL    Monocytes Absolute 0.49 0.10 - 1.30 K/UL    Eosinophils Absolute 0.12 0.00 - 0.80 K/UL    Basophils Absolute 0.03 0.00 - 0.20 K/UL    Immature Granulocytes Absolute 0.02 0.0 - 0.5 K/UL       FOLLOW UP    Return for march fasting labs and visit.      Past Medical History, Past Surgical History, Family history, Social History, and Medications were all reviewed with the patient today and updated as necessary.               "

## 2024-10-18 ENCOUNTER — Inpatient Hospital Stay: Admit: 2024-10-18 | Payer: MEDICARE | Primary: Family

## 2024-10-18 LAB — VITAMIN B12: Vitamin B-12: 917 pg/mL (ref 193–986)

## 2024-10-18 NOTE — Progress Notes (Signed)
 "Lee Page  DOB: Aug 16, 1951  Primary: Medicare Part A [place] B (Medicare)  Secondary: [place] [employer] MED[place]ARE SUPP [facility]  [address]  [id number]  [place] [address]  Phone: [phone]  Fax: [phone] Plan Frequency: 2x8 weeks  Plan of Care/Certification Expiration Date: 01/03/25        Plan of Care/Certification Expiration Date:  Plan of Care/Certification Expiration Date: 01/03/25    Frequency/Duration: Plan Frequency: 2x8 weeks      Time In/Out:   Time In: 1352  Time Out: 1445  Minutes: 53      PT Visit Info:    Progress Note Due Date: 11/04/24  Total # of Visits to Date: 4      Visit Count:  4    OUTPATIENT PHYSICAL THERAPY:   Treatment Note 10/18/2024       Episode  (Dizziness)               Treatment Diagnosis:    Dizziness and giddiness  Difficulty in walking, not elsewhere classified  Muscle weakness (generalized)  Medical/Referring Diagnosis:    Dizziness [R42]  Disequilibrium [R42]      Referring Physician:  Geno Prentice DASEN, MD  MD Orders:  PT Eval and Treat   Return MD Appt:  TBD   Date of Onset:  Onset Date: -- (4-5 months ago)     Allergies:   Antihistamines, diphenhydramine-type; Penicillins; Statins; Sulfa antibiotics; and Codeine  Restrictions/Precautions:   Fall Precautions:        Interventions Planned (Treatment may consist of any combination of the following):     See Assessment Note    Subjective Comments:   Pt says he still didn't sleep well, and is having a lot of life stress  Initial Pain Level:     0/10  Post Session Pain Level:      0/10  Medications Last Reviewed: 10/18/2024  Updated Objective Findings:  None Today  Treatment     Therapeutic Exercises:  (8 minutes)  -The following interventions were performed to improve ROM, strength, balance, endurance or aerobic capacity to improve exercise tolerance and facilitate return to functional ADLs.     Date:  10/05/24  Date:  10/12/24  Date:  10/16/24  Date:  10/18/24    Activity/Exercise  Parameters Parameters Parameters    Education Tissue healing, anatomy, mechanics, balance principles, sensory integration; strengthening principles, HEP      Nustep/bike  Nustep lv3 hills Nustep lv4 hills Nustep lv4 hills   Mobility                  Neuromuscular Re-education: (45 minutes)  -The following interventions were performed with appropriate cueing to improve balance, proprioception, and motor coordination to reduce fall risk and improve performance of static and dynamic motor tasks.       Date:  10/05/24  Date:  10/12/24  Date:  10/16/24  Date:  10/18/24    Activity/Exercise Parameters Parameters Parameters    Seated Balance -HT/HN x10 EO/EC  -forward bends 2x10 EO/EC each Seated on DD with normal stance:  -HT/HN x15 EO/EC  -forward bends 3HP x15 EO/EC each, each way  -static FB w/HT/HN x15 EO/EC each  -lateral weight-shift 1x10 EO/EC each Seated on DD with normal stance:  -HT/HN 2x15 EO/EC  -forward bends 3HP x15 EO/EC each, each way  -seated flat 1/4 turns with stepping; 1x15 EO/EC each  -chest press with narrow stance #8 x15  -OH press with  narrow stance 8# x15    Standing balance  With 2 sticks  -HT/HN x10 EO/EC  With 2 sticks  -HT/HN x10 EO/EC  -FB x15 EO/EC   Walking balance       Gaze Stability    VOR all versions Vert and Hor x15  VOR C all Vert and Hor x15                      **All with extensive cuing (tac, vis, verbal, man); for recruitment, motor control, proprioception Date:  10/05/24  Date:   Date:     Activity/Exercise Parameters Parameters Parameters   Transfer therex X15 marches B  X15 heel raises seated B  X15 laqs B                           Therapeutic Activity:  (0 minutes)  -The following interventions were performed to improve functional mobility demands, endurance, and motor coordination to optimize performance of daily functional tasks.    GAIT TRAINING: (0 minutes):    Gait training to improve and/or restore physical functioning as related to strength, balance, and  coordination.  Ambulated 125 feet with supervision/set-up and moderate visual cueing, verbal cueing, and tactile cueing related to their stance phase and stride length to promote proper body posture and promote proper body mechanics..  Instruction in performance of SPC to correct stride length, push off, and hip position and motion.      Treatment/Session Summary:    Treatment Assessment:   Pt presents with moderate irritability. Pt has increased negative self-talk and appears anxious and agitated. Education on seeking counseling and pt does not acknowledge and is resistive. Introduced gaze stability therex due to subjective c/o of difficulty focusing with body/head movements with eyes open. Pt has the most difficulty with version 2 of VOR therex both vertically and horizontally. Pt would continue to benefit from skilled PT services to address the above deficits, increase confidence, increase independence, and progress towards goals.   Communication/Consultation:  None today  Equipment provided today:  HEP and Medbridge exercise access code: see below  Recommendations/Intent for next treatment session: Next visit will focus on overall BLE functional strengthening, seated balance progressing to standing and stepping therex as tol.    >Total Treatment Billable Duration:  53 minutes (8 TE 45 neuro) pt treated one on one with no other pts  Time In: 1352  Time Out: 1445     [name], PT       Access Code: [email or web address]  URL: [email or web address]  Date: 10/05/2024  Prepared by: [name]    Exercises  - Seated Cervical Rotation AROM  - 3 x daily - 7 x weekly - 3 sets - 10 reps  - Seated Cervical Extension AROM  - 3 x daily - 7 x weekly - 3 sets - 10 reps  - Seated Forward Bending  - 3 x daily - 7 x weekly - 3 sets - 10 reps  - Forward Bending Hip Flexion with Flat Lumbar Spine  - 3 x daily - 7 x weekly - 3 sets - 10 reps  - Supine Lower Trunk Rotation  - 3 x daily - 7 x weekly - 3 sets - 10 reps  -  Clamshell  - 3 x daily - 7 x weekly - 3 sets - 10 reps  - Supine Bridge  - 3 x daily - 7 x weekly - 3 sets -  10 reps  - Sidelying Hip Abduction  - 3 x daily - 7 x weekly - 3 sets - 10 reps  - Supine Active Straight Leg Raise  - 3 x daily - 7 x weekly - 3 sets - 10 reps  - Standing Hip Abduction with Counter Support  - 3 x daily - 7 x weekly - 3 sets - 10 reps  - Standing Hip Extension with Counter Support  - 3 x daily - 7 x weekly - 3 sets - 10 reps  - Heel Raises with Counter Support  - 3 x daily - 7 x weekly - 3 sets - 10 reps    Charge Capture  Events  MedBridge Portal  Appt Desk  Attendance Report     Future Appointments   Date Time Provider Department Center   10/23/2024  1:15 PM Tia Collar, PT [facility]   10/25/2024 11:15 AM Tia Collar, PT [facility]   10/30/2024 10:30 AM Tia Collar, PT [facility]   10/30/2024  3:00 PM Long, Evonne, AuD [facility]   11/01/2024 11:15 AM Tia Collar, PT [facility]   11/06/2024 11:15 AM Tia Collar, PT [facility]   11/08/2024 11:15 AM Tia Collar, PT [facility]   11/13/2024 11:15 AM Tia Collar, PT [facility]   11/15/2024 11:15 AM Tia Collar, PT [facility]   11/20/2024  1:15 PM Tia Collar, PT [facility]   11/22/2024 10:30 AM Tia Collar, PT [facility]   11/27/2024  2:00 PM Tia Collar, PT [facility]   11/29/2024 10:30 AM Tia Collar, PT [facility]   12/29/2024 10:30 AM Geno, Prentice DASEN, MD [facility]   01/01/2025  8:15 AM Ricke Rosina NOVAK, APRN - CNP [id number] [facility]   02/16/2025  9:30 AM Kayren Laneta DEL, APRN - CNP [facility]   03/15/2025 10:10 AM Lear Nest L, PA [facility]        "

## 2024-10-19 ENCOUNTER — Encounter

## 2024-10-19 NOTE — Telephone Encounter (Signed)
 Please advise

## 2024-10-19 NOTE — Telephone Encounter (Signed)
"  Ozempic  and metformin  should not cause hypoglycemia. Sometimes you can make excessive insulin  with increasing sugar intake and maybe had a reactive hypoglycemia. Ask him to avoid sweets, monitor glucose and let me know if this continues  "

## 2024-10-23 ENCOUNTER — Inpatient Hospital Stay: Admit: 2024-10-23 | Payer: MEDICARE | Primary: Family

## 2024-10-23 NOTE — Progress Notes (Signed)
 "Lee Page  DOB: 15-Jun-1951  Primary: Medicare Part A [place] B (Medicare)  Secondary: [place] SUPP [facility]  [address]  [address]  [place] [address]  Phone: [phone]  Fax: [phone] Plan Frequency: 2x8 weeks  Plan of Care/Certification Expiration Date: 01/03/25        Plan of Care/Certification Expiration Date:  Plan of Care/Certification Expiration Date: 01/03/25    Frequency/Duration: Plan Frequency: 2x8 weeks      Time In/Out:   Time In: 1315  Time Out: 1400  Minutes: 45      PT Visit Info:    Progress Note Due Date: 11/04/24  Total # of Visits to Date: 5      Visit Count:  5    OUTPATIENT PHYSICAL THERAPY:   Treatment Note 10/23/2024       Episode  (Dizziness)               Treatment Diagnosis:    Dizziness and giddiness  Difficulty in walking, not elsewhere classified  Muscle weakness (generalized)  Medical/Referring Diagnosis:    Dizziness [R42]  Disequilibrium [R42]      Referring Physician:  Geno Prentice DASEN, MD  MD Orders:  PT Eval and Treat   Return MD Appt:  TBD   Date of Onset:  Onset Date: -- (4-5 months ago)     Allergies:   Antihistamines, diphenhydramine-type; Penicillins; Statins; Sulfa antibiotics; and Codeine  Restrictions/Precautions:   Fall Precautions:        Interventions Planned (Treatment may consist of any combination of the following):     See Assessment Note    Subjective Comments:   Pt says he feels like he needs his cane more and more lately. Still having a lot of stress and not sleeping well  Initial Pain Level:     0/10  Post Session Pain Level:      0/10  Medications Last Reviewed: 10/23/2024  Updated Objective Findings:  None Today  Treatment     Therapeutic Exercises:  (8 minutes)  -The following interventions were performed to improve ROM, strength, balance, endurance or aerobic capacity to improve exercise tolerance and facilitate return to functional ADLs.     Date:  10/05/24  Date:  10/12/24  Date:  10/16/24   Date:  10/18/24  Date:  10/23/24    Activity/Exercise Parameters Parameters Parameters     Education Tissue healing, anatomy, mechanics, balance principles, sensory integration; strengthening principles, HEP       Nustep/bike  Nustep lv3 hills Nustep lv4 hills Nustep lv4 hills Nustep lv4 hills   Mobility                    Neuromuscular Re-education: (37 minutes)  -The following interventions were performed with appropriate cueing to improve balance, proprioception, and motor coordination to reduce fall risk and improve performance of static and dynamic motor tasks.       Date:  10/05/24  Date:  10/12/24  Date:  10/16/24  Date:  10/18/24  Date:  10/23/24    Activity/Exercise Parameters Parameters Parameters     Seated Balance -HT/HN x10 EO/EC  -forward bends 2x10 EO/EC each Seated on DD with normal stance:  -HT/HN x15 EO/EC  -forward bends 3HP x15 EO/EC each, each way  -static FB w/HT/HN x15 EO/EC each  -lateral weight-shift 1x10 EO/EC each Seated on DD with normal stance:  -HT/HN 2x15 EO/EC  -forward bends 3HP x15 EO/EC  each, each way  -seated flat 1/4 turns with stepping; 1x15 EO/EC each  -chest press with narrow stance #8 x15  -OH press with narrow stance 8# x15     Standing balance  With 2 sticks  -HT/HN x10 EO/EC  With 2 sticks  -HT/HN x10 EO/EC  -FB x15 EO/EC With 2 sticks  -HT/HN x10 EO/EC  -1/4 turns x15 EO/EC B  -1/2 turns 1x5 B EO only  -FB x15 EO/EC  -static FB w/HT/HN x15 EO only   Walking balance        Gaze Stability    VOR all versions Vert and Hor x15  VOR C all Vert and Hor x15                         **All with extensive cuing (tac, vis, verbal, man); for recruitment, motor control, proprioception Date:  10/05/24  Date:   Date:     Activity/Exercise Parameters Parameters Parameters   Transfer therex X15 marches B  X15 heel raises seated B  X15 laqs B                           Therapeutic Activity:  (0 minutes)  -The following interventions were performed to improve functional  mobility demands, endurance, and motor coordination to optimize performance of daily functional tasks.    GAIT TRAINING: (0 minutes):    Gait training to improve and/or restore physical functioning as related to strength, balance, and coordination.  Ambulated 125 feet with supervision/set-up and moderate visual cueing, verbal cueing, and tactile cueing related to their stance phase and stride length to promote proper body posture and promote proper body mechanics..  Instruction in performance of SPC to correct stride length, push off, and hip position and motion.      Treatment/Session Summary:    Treatment Assessment:   Pt presents with low/moderate irritability. Pt requires cuing to avoid excessive body weight  through UEs/sticks. Pt would continue to benefit from skilled PT services to address the above deficits, increase confidence, increase independence, and progress towards goals.   Communication/Consultation:  None today  Equipment provided today:  HEP and Medbridge exercise access code: see below  Recommendations/Intent for next treatment session: Next visit will focus on overall BLE functional strengthening, seated balance progressing to standing and stepping therex as tol.    >Total Treatment Billable Duration:  45 minutes (8 TE 37 neuro)   Time In: 1315  Time Out: 1400     [name], PT       Access Code: [email or web address]  URL: [email or web address]  Date: 10/05/2024  Prepared by: [name]    Exercises  - Seated Cervical Rotation AROM  - 3 x daily - 7 x weekly - 3 sets - 10 reps  - Seated Cervical Extension AROM  - 3 x daily - 7 x weekly - 3 sets - 10 reps  - Seated Forward Bending  - 3 x daily - 7 x weekly - 3 sets - 10 reps  - Forward Bending Hip Flexion with Flat Lumbar Spine  - 3 x daily - 7 x weekly - 3 sets - 10 reps  - Supine Lower Trunk Rotation  - 3 x daily - 7 x weekly - 3 sets - 10 reps  - Clamshell  - 3 x daily - 7 x weekly - 3 sets - 10 reps  - Supine Bridge  - 3 x  daily - 7 x  weekly - 3 sets - 10 reps  - Sidelying Hip Abduction  - 3 x daily - 7 x weekly - 3 sets - 10 reps  - Supine Active Straight Leg Raise  - 3 x daily - 7 x weekly - 3 sets - 10 reps  - Standing Hip Abduction with Counter Support  - 3 x daily - 7 x weekly - 3 sets - 10 reps  - Standing Hip Extension with Counter Support  - 3 x daily - 7 x weekly - 3 sets - 10 reps  - Heel Raises with Counter Support  - 3 x daily - 7 x weekly - 3 sets - 10 reps    Charge Capture  Events  MedBridge Portal  Appt Desk  Attendance Report     Future Appointments   Date Time Provider Department Center   10/25/2024 11:15 AM Tia Collar, PT [facility]   10/30/2024 10:30 AM Tia Collar, PT [facility]   10/30/2024  3:00 PM Long, Evonne, AuD [facility]   11/01/2024 11:15 AM Tia Collar, PT [facility]   11/06/2024 11:15 AM Tia Collar, PT [facility]   11/08/2024 11:15 AM Tia Collar, PT [facility]   11/13/2024 11:15 AM Tia Collar, PT [facility]   11/15/2024 11:15 AM Tia Collar, PT [facility]   11/20/2024  1:15 PM Tia Collar, PT [facility]   11/22/2024 10:30 AM Tia Collar, PT [facility]   11/27/2024  2:00 PM Tia Collar, PT [facility]   11/29/2024 10:30 AM Tia Collar, PT [facility]   12/29/2024 10:30 AM Geno, Prentice DASEN, MD [facility]   01/01/2025  8:15 AM Ricke Rosina NOVAK, APRN - CNP [id number] [facility]   02/16/2025  9:30 AM Kayren Laneta DEL, APRN - CNP [facility]   03/15/2025 10:10 AM Lear Nest L, PA [facility]        "

## 2024-10-24 ENCOUNTER — Encounter

## 2024-10-25 ENCOUNTER — Inpatient Hospital Stay: Admit: 2024-10-25 | Payer: MEDICARE | Primary: Family

## 2024-10-25 NOTE — Progress Notes (Signed)
 "Lee Page  DOB: 07-27-1951  Primary: Medicare Part A [place] B (Medicare)  Secondary: [place] SUPP [facility]  [address]  [address]  [place] [address]  Phone: [phone]  Fax: [phone] Plan Frequency: 2x8 weeks  Plan of Care/Certification Expiration Date: 01/03/25        Plan of Care/Certification Expiration Date:  Plan of Care/Certification Expiration Date: 01/03/25    Frequency/Duration: Plan Frequency: 2x8 weeks      Time In/Out:   Time In: 1115  Time Out: 1159  Minutes: 44      PT Visit Info:    Progress Note Due Date: 11/04/24  Total # of Visits to Date: 6      Visit Count:  6    OUTPATIENT PHYSICAL THERAPY:   Treatment Note 10/25/2024       Episode  (Dizziness)               Treatment Diagnosis:    Dizziness and giddiness  Difficulty in walking, not elsewhere classified  Muscle weakness (generalized)  Medical/Referring Diagnosis:    Dizziness [R42]  Disequilibrium [R42]      Referring Physician:  Geno Prentice DASEN, MD  MD Orders:  PT Eval and Treat   Return MD Appt:  TBD   Date of Onset:  Onset Date: -- (4-5 months ago)     Allergies:   Antihistamines, diphenhydramine-type; Penicillins; Statins; Sulfa antibiotics; and Codeine  Restrictions/Precautions:   Fall Precautions:        Interventions Planned (Treatment may consist of any combination of the following):     See Assessment Note    Subjective Comments:   Pt says he is doing well, feels very unsteady at home  Initial Pain Level:     0/10  Post Session Pain Level:      0/10  Medications Last Reviewed: 10/25/2024  Updated Objective Findings:  None Today  Treatment     Therapeutic Exercises:  (8 minutes)  -The following interventions were performed to improve ROM, strength, balance, endurance or aerobic capacity to improve exercise tolerance and facilitate return to functional ADLs.     Date:  10/05/24  Date:  10/12/24  Date:  10/16/24  Date:  10/18/24  Date:  10/23/24  Date:  10/25/24     Activity/Exercise Parameters Parameters Parameters      Education Tissue healing, anatomy, mechanics, balance principles, sensory integration; strengthening principles, HEP        Nustep/bike  Nustep lv3 hills Nustep lv4 hills Nustep lv4 hills Nustep lv4 hills Nustep lv5 hills   Mobility                      Neuromuscular Re-education: (36 minutes)  -The following interventions were performed with appropriate cueing to improve balance, proprioception, and motor coordination to reduce fall risk and improve performance of static and dynamic motor tasks.       Date:  10/12/24  Date:  10/16/24  Date:  10/18/24  Date:  10/23/24  Date:  10/25/24    Activity/Exercise Parameters Parameters      Seated Balance Seated on DD with normal stance:  -HT/HN x15 EO/EC  -forward bends 3HP x15 EO/EC each, each way  -static FB w/HT/HN x15 EO/EC each  -lateral weight-shift 1x10 EO/EC each Seated on DD with normal stance:  -HT/HN 2x15 EO/EC  -forward bends 3HP x15 EO/EC each, each way  -seated flat 1/4 turns with  stepping; 1x15 EO/EC each  -chest press with narrow stance #8 x15  -OH press with narrow stance 8# x15      Standing balance With 2 sticks  -HT/HN x10 EO/EC  With 2 sticks  -HT/HN x10 EO/EC  -FB x15 EO/EC With 2 sticks  -HT/HN x10 EO/EC  -1/4 turns x15 EO/EC B  -1/2 turns 1x5 B EO only  -FB x15 EO/EC  -static FB w/HT/HN x15 EO only With 2 sticks  -HT/HN x15 EO/EC  -Surge stepping 1x10 EO  -lateral stepping 1x10 EO  -Bob stepping 1x10 EO  -triple stepping 1x5 EO B   Walking balance        Gaze Stability   VOR all versions Vert and Hor x15  VOR C all Vert and Hor x15                          **All with extensive cuing (tac, vis, verbal, man); for recruitment, motor control, proprioception Date:  10/05/24  Date:   Date:     Activity/Exercise Parameters Parameters Parameters   Transfer therex X15 marches B  X15 heel raises seated B  X15 laqs B                           Therapeutic Activity:  (0  minutes)  -The following interventions were performed to improve functional mobility demands, endurance, and motor coordination to optimize performance of daily functional tasks.    GAIT TRAINING: (0 minutes):    Gait training to improve and/or restore physical functioning as related to strength, balance, and coordination.  Ambulated 125 feet with supervision/set-up and moderate visual cueing, verbal cueing, and tactile cueing related to their stance phase and stride length to promote proper body posture and promote proper body mechanics..  Instruction in performance of SPC to correct stride length, push off, and hip position and motion.      Treatment/Session Summary:    Treatment Assessment:   Pt presents with low/moderate irritability. Pt continues to requires excessive cuing to avoid excessive body weight  through UEs/sticks. Pt has difficulty with lateral stepping due to apprehension with weight-shifting. Pt would continue to benefit from skilled PT services to address the above deficits, increase confidence, increase independence, and progress towards goals.   Communication/Consultation:  None today  Equipment provided today:  HEP and Medbridge exercise access code: see below  Recommendations/Intent for next treatment session: Next visit will focus on overall BLE functional strengthening, seated balance progressing to standing and stepping therex as tol.    >Total Treatment Billable Duration:  44 minutes (8 TE 36 neuro)   Time In: 1115  Time Out: 1159     [name], PT       Access Code: [email or web address]  URL: [email or web address]  Date: 10/05/2024  Prepared by: [name]    Exercises  - Seated Cervical Rotation AROM  - 3 x daily - 7 x weekly - 3 sets - 10 reps  - Seated Cervical Extension AROM  - 3 x daily - 7 x weekly - 3 sets - 10 reps  - Seated Forward Bending  - 3 x daily - 7 x weekly - 3 sets - 10 reps  - Forward Bending Hip Flexion with Flat Lumbar Spine  - 3 x daily - 7 x weekly - 3 sets -  10 reps  - Supine Lower Trunk Rotation  - 3 x daily - 7  x weekly - 3 sets - 10 reps  - Clamshell  - 3 x daily - 7 x weekly - 3 sets - 10 reps  - Supine Bridge  - 3 x daily - 7 x weekly - 3 sets - 10 reps  - Sidelying Hip Abduction  - 3 x daily - 7 x weekly - 3 sets - 10 reps  - Supine Active Straight Leg Raise  - 3 x daily - 7 x weekly - 3 sets - 10 reps  - Standing Hip Abduction with Counter Support  - 3 x daily - 7 x weekly - 3 sets - 10 reps  - Standing Hip Extension with Counter Support  - 3 x daily - 7 x weekly - 3 sets - 10 reps  - Heel Raises with Counter Support  - 3 x daily - 7 x weekly - 3 sets - 10 reps    Charge Capture  Events  MedBridge Portal  Appt Desk  Attendance Report     Future Appointments   Date Time Provider Department Center   10/30/2024 10:30 AM Tia Collar, PT [facility]   10/30/2024  3:00 PM Long, Evonne, AuD [facility]   11/01/2024 11:15 AM Tia Collar, PT [facility]   11/06/2024 11:15 AM Tia Collar, PT [facility]   11/08/2024 11:15 AM Tia Collar, PT [facility]   11/13/2024 11:15 AM Tia Collar, PT [facility]   11/15/2024 11:15 AM Tia Collar, PT [facility]   11/20/2024  1:15 PM Tia Collar, PT [facility]   11/22/2024 10:30 AM Tia Collar, PT [facility]   11/27/2024  2:00 PM Tia Collar, PT [facility]   11/29/2024 10:30 AM Tia Collar, PT [facility]   12/29/2024 10:30 AM Geno, Prentice DASEN, MD [facility]   01/01/2025  8:15 AM Ricke Rosina NOVAK, APRN - CNP [id number] [facility]   02/16/2025  9:30 AM Kayren Laneta DEL, APRN - CNP [facility]   03/15/2025 10:10 AM Lear Nest L, PA [facility]        "

## 2024-10-25 NOTE — Therapy Discharge (Signed)
 "     Lee Page  DOB: October 31, 1951  Primary: Medicare Part A [place] B (Medicare)  Secondary: [place] SUPP [facility]  [address]  [address]  [place] [address]  Phone: [phone]  Fax: [phone] Plan Frequency: 2x8 weeks    Plan of Care/Certification Expiration Date: 01/03/25      Events      Plan of Care/Certification Expiration Date:  Plan of Care/Certification Expiration Date: 01/03/25    Frequency/Duration: Plan Frequency: 2x8 weeks      Time In/Out:   Time In: 1115  Time Out: 1159  Minutes: 44     PT Visit Info:    Progress Note Due Date: 11/04/24  Total # of Visits to Date: 7      Visit Count:  6                OUTPATIENT PHYSICAL THERAPY:             Discharge Summary and Discontinuation Summary 10/25/2024               Episode (Dizziness)         Treatment Diagnosis:     Dizziness and giddiness  Difficulty in walking, not elsewhere classified  Muscle weakness (generalized)  Medical/Referring Diagnosis:    Dizziness [R42]  Disequilibrium [R42]  Referring Physician:  Geno Prentice DASEN, MD    MD Orders:  PT Eval and Treat   Return MD Appt:  TBD  Date of Onset:  Onset Date: -- (4-5 months ago)     Allergies:  Antihistamines, diphenhydramine-type; Penicillins; Statins; Sulfa antibiotics; and Codeine  Restrictions/Precautions:    Fall Precautions:        Medications Last Reviewed: 10/25/2024     SUBJECTIVE   History of Injury/Illness (Reason for Referral): He says he has had dizziness for about 4-31months. Going down steps and he feels that the last step-blends in and he had trouble navigating. He has only one fall in the recent past. He does report he is nervous about falling. He says he gets dizziness when he gets out of bed but not rolling over or getting into bed. He feels walking down a hallway he feels unsteady, like wobbles from side to side. He lives with his wife in an retirement community 1st floor. He feels confident going up the stairs but he  admit he uses the elevator instead of down steps. He admits he needs to get his eyes checked. He reports that his dizziness is more unsteady and wobbly,not spinny. He does have diabetes and hasn't had his blood sugar monitor on for 2 months and has neuropathy.  Patient Stated Goal(s):  better balance  Initial Pain Level:      0/10   Post Session Pain Level:     0/10  Past Medical History/Comorbidities:   Lee Page  has a past medical history of Anxiety, Colon polyp, Depression, Erectile dysfunction, Hypertension, Kidney stone, Neuropathy, Obesity, Restless legs syndrome, Sleep apnea, and Type 2 diabetes mellitus without complication.  Lee Page  has a past surgical history that includes Appendectomy (1974?); eye surgery (2000?); Tonsillectomy (1960?); Colonoscopy (N/A, 02/24/2023); Cystoscopy (09/29/2021); Kidney stone surgery (2021 or 2022); and sinus surgery.         OBJECTIVE     UNABLE TO REASSESS    Gait:  nothing to note       MMTs DATE  10/05/24  DATE     Hip Flexion R: 3+  L: 3+ R:   L:    Knee Ext R: 4-  L: 4 R:  L:   Knee Flex R: 3  L: 3+ R:  L:   Hip ER R: 3-  L: 3 R:   L:    Hip IR R: 3-  L: 3 R:   L:    Ankle Dorsiflexion  R: 4  L: 4 R:   L:     Special Test/Function:  Sit-stand: 30 sec Chair Rise: not tested due to time    Tests Date:  10/05/24  Date:   Date:      Parameters Parameters Parameters   Smooth Pursuit Hor: lots of catch ups, no symptoms  Vert: lots of catch ups, no symptoms     Saccades Hor: anticipates but no overs/unders  Vert: anticipates but no overs/unders     VOR C Hor: hard to focus  Vert: hard to focus           BPPV testing L: not tested, not indicated  R: not tested, not indicated           Dizziness Descriptors Imbalance, unsteady       UNABLE TO REASSESS        Outcome Measure:   Tool Used: Dizziness Handicap Index UNABLE TO REASSESS  Score:  Initial: 38  Most Recent: X (Date: -- )   Interpretation of Score: To each item, the following scores may be assigned: No = 0,  Sometimes = 2, Yes = 4.  Scores greater than 10 points should be referred to balance specialists for further evaluation.     ASSESSMENT   Initial Assessment:  Patient is a 73 year old male who presents to PT services with signs and symptoms consistent with unsteadiness on feet including limited lumbar/hip ROM, BLE weakness, decreased balance, decreased endurance, decreased muscle power. These deficits impair ability to ambulate on uneven ground, bend, squat, stoop, lift, carry, complete ADLs/IADLs such as transfers, dressing, and participate in preferred activities such as spending time with his wife and friends. He would benefit from skilled PT services to address the above-named deficits, increase their independence, decrease symptom severity, increase confidence, and safely and effectively return to PLOF.     DISCHARGE:  Pt will be discharged from PT services at this time due to too much going on his daily life and increased stressed. He was educated on continuing with hier HEP and to return if he wished to continue with PT in the future    PLAN   Effective Dates: 10/05/2024 TO Plan of Care/Certification Expiration Date: 01/03/25     Frequency/Duration: Plan Frequency: 2x8 weeks      Interventions Planned (Treatment may consist of any combination of the following):    Location Manager, Building Services Engineer, Investment Banker, Operational, Home Exercise Program (HEP), Manual Therapy, Neuromuscular Re-education/Strengthening, Therapeutic Activites, Therapeutic Exercise/Strengthening, and Vestibular Rehab   Goals: (Goals have been discussed and agreed upon with patient.)  Short Term Goals: 4 weeks  1. Pt will be independent w/ HEP to improve outcomes and increase functional balance abilities. UNABLE TO REASSESS  2. Pt will report an increase in confidence with ambulating around her home and bending to pick up objectsUNABLE TO REASSESS  3. Pt will demonstrate 10 STS in 30 seconds from a standard chairUNABLE TO REASSESS  Long  Term Goals. 8  weeks  1. Pt will have DHI score of 20 or less showing decreased dizzy and decreased functional impairments. UNABLE TO REASSESS  2. Pt will demonstrate 4+/5 gross BLE strength for squatting and transfersUNABLE TO REASSESS  3. Pt will demonstrate walking balance with head movements without increase in symptoms for community navigation UNABLE TO REASSESS         DISCHARGE:  Pt will be discharged from PT services at this time due to too much going on his daily life and increased stressed. He was educated on continuing with hier HEP and to return if he wished to continue with PT in the future    Regarding Brenda E Poulsen's therapy, I certify that the treatment plan above will be carried out by a therapist or under their direction.  Thank you for this referral,  [name], PT     Referring Physician Signature: Geno Prentice DASEN, MD                    Charge Capture  Events  Appt Desk  Attendance Report         "

## 2024-10-30 ENCOUNTER — Ambulatory Visit: Admit: 2024-10-30 | Discharge: 2024-10-30 | Payer: MEDICARE | Attending: Audiologist | Primary: Family

## 2024-10-30 ENCOUNTER — Inpatient Hospital Stay: Admit: 2024-10-30 | Payer: MEDICARE | Primary: Family

## 2024-10-30 DIAGNOSIS — H903 Sensorineural hearing loss, bilateral: Principal | ICD-10-CM

## 2024-10-30 NOTE — Progress Notes (Signed)
"  AUDIOLOGY EVALUATION    Lee Page had Tympanometry and Audiometry performed today.    The patient reports hearing loss and intermittent tinnitus.    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

## 2024-10-30 NOTE — Progress Notes (Signed)
 "Lee Page  DOB: November 12, 1951  Primary: Medicare Part A And B (Medicare)  Secondary: [place] [employer] MEDICARE SUPP [facility]  [address]  [address]  [place] [address]  Phone: [phone]  Fax: [phone] Plan Frequency: 2x8 weeks  Plan of Care/Certification Expiration Date: 01/03/25        Plan of Care/Certification Expiration Date:  Plan of Care/Certification Expiration Date: 01/03/25    Frequency/Duration: Plan Frequency: 2x8 weeks      Time In/Out:   Time In: 0910  Time Out: 0948  Minutes: 38      PT Visit Info:    Progress Note Due Date: 11/04/24  Total # of Visits to Date: 7      Visit Count:  7    OUTPATIENT PHYSICAL THERAPY:   Treatment Note 10/30/2024       Episode  (Dizziness)               Treatment Diagnosis:    Dizziness and giddiness  Difficulty in walking, not elsewhere classified  Muscle weakness (generalized)  Medical/Referring Diagnosis:    Dizziness [R42]  Disequilibrium [R42]      Referring Physician:  Geno Prentice DASEN, MD  MD Orders:  PT Eval and Treat   Return MD Appt:  TBD   Date of Onset:  Onset Date: -- (4-5 months ago)     Allergies:   Antihistamines, diphenhydramine-type; Penicillins; Statins; Sulfa antibiotics; and Codeine  Restrictions/Precautions:   Fall Precautions:        Interventions Planned (Treatment may consist of any combination of the following):     See Assessment Note    Subjective Comments:   Pt says that he has too much going on and will DC today. Wants to return in January  Initial Pain Level:     0/10  Post Session Pain Level:      0/10  Medications Last Reviewed: 10/30/2024  Updated Objective Findings:  None Today  Treatment     Therapeutic Exercises:  (8 minutes)  -The following interventions were performed to improve ROM, strength, balance, endurance or aerobic capacity to improve exercise tolerance and facilitate return to functional ADLs.     Date:  10/05/24  Date:  10/12/24  Date:  10/16/24  Date:  10/18/24  Date:  10/23/24   Date:  10/25/24  Date:  10/30/24    Activity/Exercise Parameters Parameters Parameters       Education Tissue healing, anatomy, mechanics, balance principles, sensory integration; strengthening principles, HEP         Nustep/bike  Nustep lv3 hills Nustep lv4 hills Nustep lv4 hills Nustep lv4 hills Nustep lv5 hills Nustep lv5 hills   Mobility                        Neuromuscular Re-education: (30 minutes)  -The following interventions were performed with appropriate cueing to improve balance, proprioception, and motor coordination to reduce fall risk and improve performance of static and dynamic motor tasks.       Date:  10/16/24  Date:  10/18/24  Date:  10/23/24  Date:  10/25/24  Date:  10/30/24    Activity/Exercise Parameters       Seated Balance Seated on DD with normal stance:  -HT/HN 2x15 EO/EC  -forward bends 3HP x15 EO/EC each, each way  -seated flat 1/4 turns with stepping; 1x15 EO/EC each  -chest press with narrow stance #8 x15  -  OH press with narrow stance 8# x15       Standing balance  With 2 sticks  -HT/HN x10 EO/EC  -FB x15 EO/EC With 2 sticks  -HT/HN x10 EO/EC  -1/4 turns x15 EO/EC B  -1/2 turns 1x5 B EO only  -FB x15 EO/EC  -static FB w/HT/HN x15 EO only With 2 sticks  -HT/HN x15 EO/EC  -Surge stepping 1x10 EO  -lateral stepping 1x10 EO  -Bob stepping 1x10 EO  -triple stepping 1x5 EO B    Walking balance     With 2 sticks:  -for/retro walking static 4HP x3 laps each, each way  -HT/HN retro/for walking x3 laps each  -sidestepping 3HP x3 laps each   Gaze Stability  VOR all versions Vert and Hor x15  VOR C all Vert and Hor x15                           **All with extensive cuing (tac, vis, verbal, man); for recruitment, motor control, proprioception Date:  10/05/24  Date:   Date:     Activity/Exercise Parameters Parameters Parameters   Transfer therex X15 marches B  X15 heel raises seated B  X15 laqs B                           Therapeutic Activity:  (0 minutes)  -The following  interventions were performed to improve functional mobility demands, endurance, and motor coordination to optimize performance of daily functional tasks.    GAIT TRAINING: (0 minutes):    Gait training to improve and/or restore physical functioning as related to strength, balance, and coordination.  Ambulated 125 feet with supervision/set-up and moderate visual cueing, verbal cueing, and tactile cueing related to their stance phase and stride length to promote proper body posture and promote proper body mechanics..  Instruction in performance of SPC to correct stride length, push off, and hip position and motion.      Treatment/Session Summary:    Treatment Assessment:   Pt presents with low/moderate irritability. Pt was giving instruction and practice with walking balance with head turns due to discharging prior to end of POC. HEP was review. Pt would continue to benefit from skilled PT services to address the above deficits, increase confidence, increase independence, and progress towards goals. At this time, PT will be DC secondary to patient request.  Communication/Consultation:  None today  Equipment provided today:  HEP and Medbridge exercise access code: see below  Recommendations/Intent for next treatment session: pt is DC'd    >Total Treatment Billable Duration:  38 minutes (8 TE 30 neuro)   Time In: 0910  Time Out: 0948     [name], PT       Access Code: [email or web address]  URL: [email or web address]  Date: 10/05/2024  Prepared by: [name]    Exercises  - Seated Cervical Rotation AROM  - 3 x daily - 7 x weekly - 3 sets - 10 reps  - Seated Cervical Extension AROM  - 3 x daily - 7 x weekly - 3 sets - 10 reps  - Seated Forward Bending  - 3 x daily - 7 x weekly - 3 sets - 10 reps  - Forward Bending Hip Flexion with Flat Lumbar Spine  - 3 x daily - 7 x weekly - 3 sets - 10 reps  - Supine Lower Trunk Rotation  - 3 x daily - 7 x weekly -  3 sets - 10 reps  - Clamshell  - 3 x daily - 7 x weekly - 3  sets - 10 reps  - Supine Bridge  - 3 x daily - 7 x weekly - 3 sets - 10 reps  - Sidelying Hip Abduction  - 3 x daily - 7 x weekly - 3 sets - 10 reps  - Supine Active Straight Leg Raise  - 3 x daily - 7 x weekly - 3 sets - 10 reps  - Standing Hip Abduction with Counter Support  - 3 x daily - 7 x weekly - 3 sets - 10 reps  - Standing Hip Extension with Counter Support  - 3 x daily - 7 x weekly - 3 sets - 10 reps  - Heel Raises with Counter Support  - 3 x daily - 7 x weekly - 3 sets - 10 reps    Charge Capture  Events  MedBridge Portal  Appt Desk  Attendance Report     Future Appointments   Date Time Provider Department Center   10/30/2024  3:00 PM Long, [place], AuD [facility]   12/29/2024 10:30 AM Geno Prentice DASEN, MD [facility]   01/01/2025  8:15 AM Ricke Rosina NOVAK, APRN - CNP PGU200 [facility]   02/16/2025  9:30 AM Kayren Laneta DEL, APRN - CNP [facility]   03/15/2025 10:10 AM Lear Nest L, PA PSCD [facility]        "

## 2024-11-01 ENCOUNTER — Encounter: Payer: MEDICARE | Primary: Family

## 2024-11-06 ENCOUNTER — Encounter: Payer: MEDICARE | Primary: Family

## 2024-11-08 ENCOUNTER — Encounter: Payer: MEDICARE | Primary: Family

## 2024-11-13 ENCOUNTER — Encounter: Payer: MEDICARE | Primary: Family

## 2024-11-15 ENCOUNTER — Encounter: Payer: MEDICARE | Primary: Family

## 2024-11-20 ENCOUNTER — Encounter: Payer: MEDICARE | Primary: Family

## 2024-11-22 ENCOUNTER — Encounter: Payer: MEDICARE | Primary: Family

## 2024-11-24 ENCOUNTER — Telehealth: Admit: 2024-11-24 | Discharge: 2024-11-24 | Payer: MEDICARE | Attending: Family | Primary: Family

## 2024-11-24 DIAGNOSIS — R413 Other amnesia: Principal | ICD-10-CM

## 2024-11-24 MED ORDER — DONEPEZIL HCL 5 MG PO TABS
5 | ORAL_TABLET | Freq: Every evening | ORAL | 3 refills | 90.00000 days | Status: AC
Start: 2024-11-24 — End: ?

## 2024-11-24 NOTE — Progress Notes (Signed)
 "Virtual Visit  Chief Complaint   Patient presents with    Follow-up    Memory Loss    Knee Pain       HPI    Memory loss: Ongoing with gradual worsening. MMSE last visit 29/30. MRI 2023 per previous provider - mild volume loss. B12 normal. TSH in range on replacement. Depression controlled. Interested in Aricept as discussed previously. Also would like referral to [facility].    Right knee pain: 1 week. No injury. Pain with weight bearing. Lateral knee. No definite swelling. Would like to see ortho          Past Medical History, Past Surgical History, Family history, Social History, and Medications were all reviewed and updated as necessary.       Current Outpatient Medications   Medication Sig Dispense Refill    donepezil (ARICEPT) 5 MG tablet Take 1 tablet by mouth nightly 90 tablet 3    tadalafil  (CIALIS ) 5 MG tablet Take 1 tablet by mouth every morning 90 tablet 3    Semaglutide ,0.25 or 0.5MG /DOS, 2 MG/3ML SOPN 0.5 mg weekly 3 mL 5    omeprazole  (PRILOSEC) 40 MG delayed release capsule Take 1 capsule by mouth every morning (before breakfast) 90 capsule 3    atorvastatin  (LIPITOR) 80 MG tablet Take 1 tablet by mouth daily 90 tablet 3    buPROPion  (WELLBUTRIN  SR) 150 MG extended release tablet Take 1 tablet by mouth 2 times daily 180 tablet 3    fenofibric acid  (TRILIPIX ) 135 MG CPDR capsule Take 1 capsule by mouth daily 90 capsule 3    finasteride  (PROSCAR ) 5 MG tablet Take 1 tablet by mouth daily 90 tablet 3    gabapentin  (NEURONTIN ) 300 MG capsule 2 capsules at bedtime 180 capsule 2    Insulin  Glargine, 2 Unit Dial, (TOUJEO  MAX SOLOSTAR) 300 UNIT/ML concentrated injection pen 50 units daily. 15 mL 5    losartan  (COZAAR ) 25 MG tablet Take 1 tablet by mouth daily 90 tablet 3    metFORMIN  (GLUCOPHAGE ) 1000 MG tablet Take 1 tablet by mouth 2 times daily (with meals) 180 tablet 3    ipratropium (ATROVENT ) 0.06 % nasal spray 2 sprays by Each Nostril route 3 times daily as needed for Rhinitis 15 mL 5     traZODone  (DESYREL ) 50 MG tablet Take 1 tablet by mouth nightly 30 tablet 5    azelastine  (ASTELIN ) 0.1 % nasal spray 2 sprays by Nasal route 2 times daily Use in each nostril as directed 30 mL 11    Insulin  Pen Needle 32G X 4 MM MISC 1 each by Does not apply route daily 100 each 3    silodosin  (RAPAFLO ) 8 MG CAPS Take 1 capsule by mouth daily 90 capsule 3    blood glucose test strips (ASCENSIA AUTODISC VI;ONE TOUCH ULTRA TEST VI) strip 1 each daily As needed.      Lancets Ultra Fine MISC by Does not apply route      sodium chloride  (OCEAN) 0.65 % nasal spray 1 spray by Nasal route as needed for Congestion OTC as directed      Cyanocobalamin (VITAMIN B 12 PO) Take 2,000 mcg by mouth OTC as directed      aspirin 81 MG EC tablet Take 1 tablet by mouth daily      Omega-3 Fatty Acids (OMEGA 3 PO) Take 500 mcg by mouth OTC as directed       No current facility-administered medications for this visit.  Allergies   Allergen Reactions    Antihistamines, Diphenhydramine-Type Other (See Comments)     Causes depression    Penicillins Hives    Statins Other (See Comments)    Sulfa Antibiotics Hives    Codeine Nausea And Vomiting     Patient Active Problem List   Diagnosis    History of kidney stones    Balance problems    Restless leg syndrome    Anxiety    Chronic bilateral low back pain without sciatica    Current mild episode of major depressive disorder    Severe obesity (BMI 35.0-35.9 with comorbidity) (HCC)    Pure hypercholesterolemia    Type 2 diabetes mellitus without complication, without long-term current use of insulin  (HCC)    Benign prostatic hyperplasia with weak urinary stream    Primary hypertension    Abnormal echocardiogram    Obesity (BMI 30-39.9)    Mild mitral regurgitation by prior echocardiogram    Chronic venous insufficiency    Memory loss    Anemia    Onychomycosis    OSA (obstructive sleep apnea)       ASSESSMENT and PLAN    Lee Page was seen today for follow-up, memory loss and knee  pain.    Diagnoses and all orders for this visit:    Memory loss  -     donepezil (ARICEPT) 5 MG tablet; Take 1 tablet by mouth nightly  -     External Referral To Geriatrics    Acute pain of right knee  -     [name] Referral    Discussed evaluation for memory loss-progression of mild cognitive changes.  MRI and labs without significant abnormality.  [facility] and referral placed.  Also would like to try Aricept 5 mg daily.  Medication and side effects reviewed.  Will call if intolerance.  Refer to Ortho for new onset right knee pain.    FOLLOW UP    Return if symptoms worsen or fail to improve.    REVIEW OF SYSTEMS    Review of Systems   Musculoskeletal:  Positive for arthralgias.   Psychiatric/Behavioral:  Positive for decreased concentration.        PHYSICAL EXAM    There were no vitals taken for this visit.     Physical Exam  Vitals reviewed.   Constitutional:       Appearance: Normal appearance. He is not ill-appearing.   Pulmonary:      Effort: Pulmonary effort is normal.   Neurological:      Mental Status: He is alert and oriented to person, place, and time.   Psychiatric:         Mood and Affect: Mood normal.         Thought Content: Thought content normal.          Medical problems and test results were reviewed with the patient today.     Lee Page, was evaluated through a synchronous (real-time) audio-video encounter. The patient (or guardian if applicable) is aware that this is a billable service, which includes applicable co-pays. This Virtual Visit was conducted with patient's (and/or legal guardian's) consent. Patient identification was verified, and a caregiver was present when appropriate.   The patient was located at Home: [address]  [address]  [name] [address]  Provider was located at [employer] (Appt Dept): [address]  [place],  [place] [address]  Confirm you are appropriately licensed,  registered, or certified to deliver care in the state where the patient is located as indicated above. If you are not or unsure, please re-schedule the visit: Yes, I confirm.      Total time spent for this encounter: Not billed by time    --Laneta VEAR Bowler, APRN - CNP on 11/24/2024 at 4:54 PM    An electronic signature was used to authenticate this note.    "

## 2024-11-27 ENCOUNTER — Encounter: Payer: MEDICARE | Primary: Family

## 2024-11-27 ENCOUNTER — Encounter

## 2024-11-28 NOTE — Telephone Encounter (Signed)
"  Rx pended  Nov With Internal Medicine Camilo VEAR Bowler, APRN - CNP)  02/16/2025 at 9:30 AM   "

## 2024-11-29 ENCOUNTER — Encounter: Payer: MEDICARE | Primary: Family

## 2024-11-29 MED ORDER — LOSARTAN POTASSIUM 25 MG PO TABS
25 | ORAL_TABLET | Freq: Every day | ORAL | 0 refills | 90.00000 days | Status: AC
Start: 2024-11-29 — End: ?

## 2024-12-06 ENCOUNTER — Ambulatory Visit: Admit: 2024-12-06 | Discharge: 2024-12-06 | Payer: MEDICARE | Attending: Orthopaedic Surgery | Primary: Family

## 2024-12-06 ENCOUNTER — Ambulatory Visit: Admit: 2024-12-06 | Payer: MEDICARE | Primary: Family

## 2024-12-06 VITALS — Ht 70.0 in | Wt 245.0 lb

## 2024-12-06 DIAGNOSIS — M25561 Pain in right knee: Principal | ICD-10-CM

## 2024-12-06 DIAGNOSIS — M1711 Unilateral primary osteoarthritis, right knee: Secondary | ICD-10-CM

## 2024-12-06 MED ORDER — METHYLPREDNISOLONE ACETATE 40 MG/ML IJ SUSP
40 | Freq: Once | INTRAMUSCULAR | Status: AC
Start: 2024-12-06 — End: 2024-12-06
  Administered 2024-12-06: 16:00:00 40 mg via INTRAMUSCULAR

## 2024-12-06 NOTE — Progress Notes (Signed)
 Name: Lee Page  Date of Birth: 1951-08-18  Gender: male  MRN: [id number]    CC: Right knee pain    HPI: Lee Page is a 74 y.o. male who presents with about a 2-week history of sudden onset of right knee pain.  Does not report any significant traumatic event.  He states he has been walking with a cane intermittently because of concerns with balance.  He has been seeing physical therapy for this purpose.  He has not had any change in his activities fall or twist etc.    He states his right knee became significantly painful to the point where he began requiring the cane for that.  He did not note any swelling but has been more aware of the knee in particular the patella with his movement and is felt with sound like perhaps some catching in the joint.    He did rest along with use of the cane.  He tried oral anti-inflammatory over-the-counter which has helped considerably.  His symptoms have improved but with failure of complete resolution and concerns regarding the etiology he came in today for further recommendations and treatment    He does have a history of chronic back pain and has had previous lumbar spine surgery but says no one specifically for this currently.  He denies numbness or tingling down the leg    History was obtained from patient and he is a retired paramedic    ROS/Meds/PSH/PMH/FH/SH: I personally reviewed the patients standard intake form.  Below are the pertinents    Tobacco:  reports that he has never smoked. He has been exposed to tobacco smoke. He has never used smokeless tobacco.  Past Medical History:   Diagnosis Date    Anxiety     Colon polyp 2017    Depression     Erectile dysfunction     Hypertension 2009    Kidney stone 2021 or 2022    Neuropathy     Obesity     Restless legs syndrome     Sleep apnea 2019?    Type 2 diabetes mellitus without complication       Past Surgical History:   Procedure Laterality Date    APPENDECTOMY  1974?    COLONOSCOPY N/A  02/24/2023    COLONOSCOPY POLYPECTOMY SNARE/COLD BIOPSY performed by Verne Heidi PENNER, MD at [facility] ENDOSCOPY    CYSTOSCOPY  09/29/2021    EYE SURGERY  2000?    Right eye cornea transplant    KIDNEY STONE SURGERY  2021 or 2022    SINUS SURGERY      TONSILLECTOMY  1960?        Physical Examination:  Physical exam: On examination of the patient's gait there is no obvious limp other than perhaps some initial antalgia he first arises from a seated position.    On examination while standing there is decreased flexion in the lumbosacral spine without acute list or spasm.    While seated ,  the Bilateral hip is examined there is full range of motion without obvious issue .    On examination of the  Right knee :ROM is 0 to 125 There is no obvious effusion and he is tender on the medial joint line without instability.. On examination of the  Left knee :ROM is 0 to 125 There is full range of motion without obvious instability.    Patient is neurologically intact distally. Skin is intact.       Imaging:  Radiographs:    An AP standing, skiers, flexion lateral, and sunrise view of the right knee knee was obtained  This demonstrated mild joint space narrowing medially on the AP standing image but on the PA skiers view he has significantly more joint space narrowing.  His left knee incidentally has more medial femoral condyle subchondral irregularity and medial osteophyte formation as well as joint space narrowing on the PA skiers image.    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  His symptoms that improved with rest and oral anti-inflammatories.  We talked about modification of activities as things improved.  I recommend he try cortisone injection today as a palliative measure.    If this settles his knee down and he is back to normal baseline nothing think further need to be done.  However symptoms are minimally  benefited by this or if his symptoms recur the next would be to get an MRI to further delineate intra-articular issues.    Otherwise he can follow back up as needed.  We did discuss the progressive nature of osteoarthritis and he understands that this may become a more recurrent issue over time.    Plan:       Follow up:   Return for As needed.     GARNETTE JONELLE RAINBOW, MD      Name: Lee Page  Date of Birth: 11-16-1951  Gender: male  MRN: [id number]        Current Outpatient Medications:     losartan  (COZAAR ) 25 MG tablet, Take 1 tablet by mouth daily, Disp: 90 tablet, Rfl: 0    donepezil  (ARICEPT ) 5 MG tablet, Take 1 tablet by mouth nightly, Disp: 90 tablet, Rfl: 3    tadalafil  (CIALIS ) 5 MG tablet, Take 1 tablet by mouth every morning, Disp: 90 tablet, Rfl: 3    Semaglutide ,0.25 or 0.5MG /DOS, 2 MG/3ML SOPN, 0.5 mg weekly, Disp: 3 mL, Rfl: 5    omeprazole  (PRILOSEC) 40 MG delayed release capsule, Take 1 capsule by mouth every morning (before breakfast), Disp: 90 capsule, Rfl: 3    atorvastatin  (LIPITOR) 80 MG tablet, Take 1 tablet by mouth daily, Disp: 90 tablet, Rfl: 3    buPROPion  (WELLBUTRIN  SR) 150 MG extended release tablet, Take 1 tablet by mouth 2 times daily, Disp: 180 tablet, Rfl: 3    fenofibric acid  (TRILIPIX ) 135 MG CPDR capsule, Take 1 capsule by mouth daily, Disp: 90 capsule, Rfl: 3    finasteride  (PROSCAR ) 5 MG tablet, Take 1 tablet by mouth daily, Disp: 90 tablet, Rfl: 3    gabapentin  (NEURONTIN ) 300 MG capsule, 2 capsules at bedtime, Disp: 180 capsule, Rfl: 2    Insulin  Glargine, 2 Unit Dial, (TOUJEO  MAX SOLOSTAR) 300 UNIT/ML concentrated injection pen, 50 units daily., Disp: 15 mL, Rfl: 5    metFORMIN  (GLUCOPHAGE ) 1000 MG tablet, Take 1 tablet by mouth 2 times daily (with meals), Disp: 180 tablet, Rfl: 3    ipratropium (ATROVENT ) 0.06 % nasal spray, 2 sprays by Each Nostril route 3 times daily as needed for Rhinitis, Disp: 15 mL, Rfl: 5    traZODone  (DESYREL ) 50 MG tablet, Take 1 tablet by  mouth nightly, Disp: 30 tablet, Rfl: 5    azelastine  (ASTELIN ) 0.1 % nasal spray, 2 sprays by Nasal route 2 times daily Use in each nostril as directed, Disp: 30 mL, Rfl: 11    Insulin  Pen Needle  32G X 4 MM MISC, 1 each by Does not apply route daily, Disp: 100 each, Rfl: 3    silodosin  (RAPAFLO ) 8 MG CAPS, Take 1 capsule by mouth daily, Disp: 90 capsule, Rfl: 3    blood glucose test strips (ASCENSIA AUTODISC VI;ONE TOUCH ULTRA TEST VI) strip, 1 each daily As needed., Disp: , Rfl:     Lancets Ultra Fine MISC, by Does not apply route, Disp: , Rfl:     sodium chloride  (OCEAN) 0.65 % nasal spray, 1 spray by Nasal route as needed for Congestion OTC as directed, Disp: , Rfl:     Cyanocobalamin (VITAMIN B 12 PO), Take 2,000 mcg by mouth OTC as directed, Disp: , Rfl:     aspirin 81 MG EC tablet, Take 1 tablet by mouth daily, Disp: , Rfl:     Omega-3 Fatty Acids (OMEGA 3 PO), Take 500 mcg by mouth OTC as directed, Disp: , Rfl:   Allergies   Allergen Reactions    Antihistamines, Diphenhydramine-Type Other (See Comments)     Causes depression    Penicillins Hives    Statins Other (See Comments)    Sulfa Antibiotics Hives    Codeine Nausea And Vomiting       RR:mphyu knee pain    Impression: Osteoarthritis of the right knee    Procedure: Depomedrol injection     Procedure Note: The right knee was prepped with alcohol. Then the right knee was injected with 1 mL of 0.5% Marcaine and 40mg  of depomedrol The patient tolerated the procedure without difficulty.      GARNETTE JONELLE RAINBOW, MD  Elements of this note have been dictated using speech recognition software. As a result, errors of speech recognition may have occurred.

## 2024-12-10 ENCOUNTER — Encounter

## 2024-12-22 ENCOUNTER — Encounter

## 2024-12-24 MED ORDER — SILODOSIN 8 MG PO CAPS
8 | ORAL_CAPSULE | Freq: Every day | ORAL | 3 refills | Status: AC
Start: 2024-12-24 — End: ?

## 2024-12-29 ENCOUNTER — Encounter: Payer: MEDICARE | Attending: Otolaryngology | Primary: Family

## 2025-01-01 ENCOUNTER — Encounter: Payer: MEDICARE | Attending: Registered Nurse | Primary: Family
